# Patient Record
Sex: Female | Born: 1935 | Race: Black or African American | Hispanic: No | Marital: Married | Smoking: Never smoker
Health system: Southern US, Community
[De-identification: ages and names within clinical notes are randomized; demographics above are authoritative.]

## PROBLEM LIST (undated history)

## (undated) DIAGNOSIS — I219 Acute myocardial infarction, unspecified: Secondary | ICD-10-CM

## (undated) DIAGNOSIS — I771 Stricture of artery: Secondary | ICD-10-CM

## (undated) DIAGNOSIS — E7800 Pure hypercholesterolemia, unspecified: Secondary | ICD-10-CM

## (undated) DIAGNOSIS — I10 Essential (primary) hypertension: Secondary | ICD-10-CM

## (undated) DIAGNOSIS — M1990 Unspecified osteoarthritis, unspecified site: Secondary | ICD-10-CM

## (undated) DIAGNOSIS — I2510 Atherosclerotic heart disease of native coronary artery without angina pectoris: Secondary | ICD-10-CM

## (undated) DIAGNOSIS — K56609 Unspecified intestinal obstruction, unspecified as to partial versus complete obstruction: Secondary | ICD-10-CM

## (undated) DIAGNOSIS — E119 Type 2 diabetes mellitus without complications: Secondary | ICD-10-CM

## (undated) HISTORY — PX: CARDIAC CATHETERIZATION: SHX172

---

## 1968-08-05 HISTORY — PX: ABDOMINAL HYSTERECTOMY: SHX81

## 1999-04-06 HISTORY — PX: CATARACT EXTRACTION: SUR2

## 2001-08-05 DIAGNOSIS — I219 Acute myocardial infarction, unspecified: Secondary | ICD-10-CM

## 2001-08-05 HISTORY — DX: Acute myocardial infarction, unspecified: I21.9

## 2001-08-05 HISTORY — PX: CORONARY ARTERY BYPASS GRAFT: SHX141

## 2013-02-19 ENCOUNTER — Encounter (HOSPITAL_COMMUNITY): Payer: Self-pay | Admitting: Emergency Medicine

## 2013-02-19 ENCOUNTER — Emergency Department (INDEPENDENT_AMBULATORY_CARE_PROVIDER_SITE_OTHER)
Admission: EM | Admit: 2013-02-19 | Discharge: 2013-02-19 | Disposition: A | Discharge status: Home / Self Care | Payer: Medicare Other | Source: Home / Self Care

## 2013-02-19 DIAGNOSIS — W19XXXA Unspecified fall, initial encounter: Secondary | ICD-10-CM

## 2013-02-19 DIAGNOSIS — S025XXA Fracture of tooth (traumatic), initial encounter for closed fracture: Secondary | ICD-10-CM

## 2013-02-19 HISTORY — DX: Essential (primary) hypertension: I10

## 2013-02-19 MED ORDER — TRAMADOL HCL 50 MG PO TABS: 50.0000 mg | ORAL_TABLET | Freq: Four times a day (QID) | ORAL | Status: DC | PRN

## 2013-02-19 NOTE — ED Notes (Signed)
Pt c/o fall x 2 days ago. She fell on the sidewalk. Has abrasions on chin, under her nose, and broke her two front teeth. Mouth was bleeding at onset but has subsided. Has been rinsing with mouth wash. Also has abrasions on both hands. Pt is alert and oriented.

## 2013-02-19 NOTE — ED Provider Notes (Signed)
[place] Sonya Chavez is a 79 y.o. female who presents to Urgent Care today for fall. Patient tripped and fell on Wednesday. She tripped over an exposed lead to the sidewalk on Wednesday. She fell striking her face on the ground. She notes that she broke both of her upper front teeth. She denies any other injury or hitting her head. She denies any loss of consciousness confusion or dizziness. She denies any chest pain palpitations or presyncopal feelings prior to the fall. She's tried some Tylenol for pain which is been somewhat effective. She feels well otherwise.    PMH reviewed. Diabetes and hypertension History  Substance Use Topics  . Smoking status: Never Smoker   . Smokeless tobacco: Not on file  . Alcohol Use: No   ROS as above Medications reviewed. No current facility-administered medications for this encounter.   Current Outpatient Prescriptions  Medication Sig Dispense Refill  . glyBURIDE (DIABETA) 2.5 MG tablet Take 10 mg by mouth 2 (two) times daily with a meal.      . metFORMIN (GLUCOPHAGE) 1000 MG tablet Take 1,000 mg by mouth 2 (two) times daily with a meal.      . traMADol (ULTRAM) 50 MG tablet Take 1 tablet (50 mg total) by mouth every 6 (six) hours as needed for pain.  30 tablet  0    Exam:  BP 136/86  Pulse 67  Temp(Src) 98.2 F (36.8 C) (Oral)  Resp 16  SpO2 95% Gen: Well NAD HEENT: EOMI,  MMM, abrasion on the nose and chin. Chipped upper front teeth Lungs: CTABL Nl WOB Heart: RRR no MRG Abd: NABS, NT, ND Exts: Non edematous BL  LE, warm and well perfused.  Neuro: Alert and oriented normal balance and coordination. Normal gait  No results found for this or any previous visit (from the past 24 hour(s)). No results found.  Assessment and Plan: 79 y.o. female with trip and fall. Resulting in facial abrasion and chipped front teeth.  Note concern for syncope as the cause of the fall.  Tramadol and Tylenol as needed for pain.  Followup with dentist for tooth  injury.      Rodolph Bong, MD 02/19/13 [phone]

## 2013-03-08 ENCOUNTER — Inpatient Hospital Stay (HOSPITAL_COMMUNITY): Payer: Medicare Other

## 2013-03-08 ENCOUNTER — Emergency Department (HOSPITAL_COMMUNITY): Payer: Medicare Other

## 2013-03-08 ENCOUNTER — Encounter (HOSPITAL_COMMUNITY): Payer: Self-pay | Admitting: *Deleted

## 2013-03-08 ENCOUNTER — Inpatient Hospital Stay (HOSPITAL_COMMUNITY)
Admission: EM | Admit: 2013-03-08 | Discharge: 2013-03-09 | DRG: 069 | Disposition: A | Discharge status: Home / Self Care | Payer: Medicare Other | Attending: Family Medicine | Admitting: Family Medicine

## 2013-03-08 DIAGNOSIS — Z7982 Long term (current) use of aspirin: Secondary | ICD-10-CM

## 2013-03-08 DIAGNOSIS — E119 Type 2 diabetes mellitus without complications: Secondary | ICD-10-CM | POA: Diagnosis present

## 2013-03-08 DIAGNOSIS — I498 Other specified cardiac arrhythmias: Secondary | ICD-10-CM | POA: Diagnosis not present

## 2013-03-08 DIAGNOSIS — R200 Anesthesia of skin: Secondary | ICD-10-CM | POA: Diagnosis present

## 2013-03-08 DIAGNOSIS — G459 Transient cerebral ischemic attack, unspecified: Principal | ICD-10-CM | POA: Diagnosis present

## 2013-03-08 DIAGNOSIS — R0989 Other specified symptoms and signs involving the circulatory and respiratory systems: Secondary | ICD-10-CM

## 2013-03-08 DIAGNOSIS — E785 Hyperlipidemia, unspecified: Secondary | ICD-10-CM | POA: Diagnosis present

## 2013-03-08 DIAGNOSIS — I10 Essential (primary) hypertension: Secondary | ICD-10-CM | POA: Diagnosis present

## 2013-03-08 DIAGNOSIS — E875 Hyperkalemia: Secondary | ICD-10-CM | POA: Diagnosis present

## 2013-03-08 DIAGNOSIS — I2510 Atherosclerotic heart disease of native coronary artery without angina pectoris: Secondary | ICD-10-CM | POA: Diagnosis present

## 2013-03-08 DIAGNOSIS — Z79899 Other long term (current) drug therapy: Secondary | ICD-10-CM

## 2013-03-08 DIAGNOSIS — M5412 Radiculopathy, cervical region: Secondary | ICD-10-CM | POA: Diagnosis present

## 2013-03-08 DIAGNOSIS — I6789 Other cerebrovascular disease: Secondary | ICD-10-CM | POA: Diagnosis present

## 2013-03-08 DIAGNOSIS — Z951 Presence of aortocoronary bypass graft: Secondary | ICD-10-CM

## 2013-03-08 DIAGNOSIS — R209 Unspecified disturbances of skin sensation: Secondary | ICD-10-CM

## 2013-03-08 DIAGNOSIS — I447 Left bundle-branch block, unspecified: Secondary | ICD-10-CM | POA: Diagnosis present

## 2013-03-08 DIAGNOSIS — R202 Paresthesia of skin: Secondary | ICD-10-CM | POA: Diagnosis present

## 2013-03-08 DIAGNOSIS — G458 Other transient cerebral ischemic attacks and related syndromes: Secondary | ICD-10-CM | POA: Diagnosis present

## 2013-03-08 LAB — POCT I-STAT, CHEM 8
BUN: 21 mg/dL (ref 6–23)
Calcium, Ion: 1.2 mmol/L (ref 1.13–1.30)
Creatinine, Ser: 1.1 mg/dL (ref 0.50–1.10)
Glucose, Bld: 165 mg/dL — ABNORMAL HIGH (ref 70–99)
Hemoglobin: 15.6 g/dL — ABNORMAL HIGH (ref 12.0–15.0)
TCO2: 27 mmol/L (ref 0–100)

## 2013-03-08 LAB — BASIC METABOLIC PANEL
BUN: 14 mg/dL (ref 6–23)
CO2: 28 mEq/L (ref 19–32)
Chloride: 105 mEq/L (ref 96–112)
Creatinine, Ser: 0.93 mg/dL (ref 0.50–1.10)
Glucose, Bld: 133 mg/dL — ABNORMAL HIGH (ref 70–99)

## 2013-03-08 LAB — CBC
MCH: 27.1 pg (ref 26.0–34.0)
MCV: 83.6 fL (ref 78.0–100.0)
Platelets: 164 10*3/uL (ref 150–400)
RDW: 15.3 % (ref 11.5–15.5)

## 2013-03-08 LAB — APTT: aPTT: 31 seconds (ref 24–37)

## 2013-03-08 MED ORDER — DOCUSATE SODIUM 100 MG PO CAPS: 100.0000 mg | ORAL_CAPSULE | Freq: Every day | ORAL | Status: DC | PRN

## 2013-03-08 MED ORDER — HYDRALAZINE HCL 20 MG/ML IJ SOLN
10.0000 mg | INTRAMUSCULAR | Status: DC | PRN
  Filled 2013-03-08 (×2): qty 1

## 2013-03-08 MED ORDER — ASPIRIN 81 MG PO CHEW
324.0000 mg | CHEWABLE_TABLET | Freq: Once | ORAL | Status: AC
  Administered 2013-03-08: 324 mg via ORAL
  Filled 2013-03-08: qty 4

## 2013-03-08 MED ORDER — ONDANSETRON HCL 4 MG/2ML IJ SOLN: 4.0000 mg | Freq: Four times a day (QID) | INTRAMUSCULAR | Status: DC | PRN

## 2013-03-08 MED ORDER — FUROSEMIDE 40 MG PO TABS
40.0000 mg | ORAL_TABLET | Freq: Every day | ORAL | Status: DC
  Administered 2013-03-08 – 2013-03-09 (×2): 40 mg via ORAL
  Filled 2013-03-08 (×2): qty 1

## 2013-03-08 MED ORDER — METOPROLOL TARTRATE 100 MG PO TABS
100.0000 mg | ORAL_TABLET | Freq: Two times a day (BID) | ORAL | Status: DC
  Administered 2013-03-08: 100 mg via ORAL
  Filled 2013-03-08 (×3): qty 1

## 2013-03-08 MED ORDER — INSULIN ASPART 100 UNIT/ML ~~LOC~~ SOLN: 0.0000 [IU] | Freq: Three times a day (TID) | SUBCUTANEOUS | Status: DC

## 2013-03-08 MED ORDER — SODIUM CHLORIDE 0.9 % IV SOLN: INTRAVENOUS | Status: DC

## 2013-03-08 MED ORDER — POTASSIUM CHLORIDE CRYS ER 20 MEQ PO TBCR
20.0000 meq | EXTENDED_RELEASE_TABLET | Freq: Every day | ORAL | Status: DC
  Administered 2013-03-08 – 2013-03-09 (×2): 20 meq via ORAL
  Filled 2013-03-08 (×2): qty 1

## 2013-03-08 MED ORDER — HEPARIN SODIUM (PORCINE) 5000 UNIT/ML IJ SOLN
5000.0000 [IU] | Freq: Three times a day (TID) | INTRAMUSCULAR | Status: DC
  Administered 2013-03-08 – 2013-03-09 (×2): 5000 [IU] via SUBCUTANEOUS
  Filled 2013-03-08 (×5): qty 1

## 2013-03-08 MED ORDER — METOPROLOL TARTRATE 25 MG PO TABS
100.0000 mg | ORAL_TABLET | Freq: Once | ORAL | Status: AC
  Administered 2013-03-08: 100 mg via ORAL
  Filled 2013-03-08: qty 4

## 2013-03-08 MED ORDER — LOSARTAN POTASSIUM 50 MG PO TABS
100.0000 mg | ORAL_TABLET | Freq: Once | ORAL | Status: DC
  Filled 2013-03-08: qty 2

## 2013-03-08 MED ORDER — LOSARTAN POTASSIUM 50 MG PO TABS
100.0000 mg | ORAL_TABLET | Freq: Once | ORAL | Status: AC
  Administered 2013-03-08: 100 mg via ORAL
  Filled 2013-03-08: qty 2

## 2013-03-08 MED ORDER — LOSARTAN POTASSIUM 50 MG PO TABS: 100.0000 mg | ORAL_TABLET | Freq: Once | ORAL | Status: DC

## 2013-03-08 MED ORDER — SODIUM CHLORIDE 0.9 % IJ SOLN
3.0000 mL | Freq: Two times a day (BID) | INTRAMUSCULAR | Status: DC
  Administered 2013-03-08 – 2013-03-09 (×2): 3 mL via INTRAVENOUS

## 2013-03-08 MED ORDER — ONDANSETRON HCL 4 MG PO TABS: 4.0000 mg | ORAL_TABLET | Freq: Four times a day (QID) | ORAL | Status: DC | PRN

## 2013-03-08 MED ORDER — TRAMADOL HCL 50 MG PO TABS: 50.0000 mg | ORAL_TABLET | Freq: Four times a day (QID) | ORAL | Status: DC | PRN

## 2013-03-08 MED ORDER — CLOPIDOGREL BISULFATE 75 MG PO TABS
75.0000 mg | ORAL_TABLET | Freq: Every day | ORAL | Status: DC
  Administered 2013-03-09: 75 mg via ORAL
  Filled 2013-03-08 (×3): qty 1

## 2013-03-08 MED ORDER — HYDRALAZINE HCL 20 MG/ML IJ SOLN: 10.0000 mg | INTRAMUSCULAR | Status: DC | PRN

## 2013-03-08 MED ORDER — ASPIRIN EC 81 MG PO TBEC
81.0000 mg | DELAYED_RELEASE_TABLET | Freq: Every day | ORAL | Status: DC
  Administered 2013-03-08 – 2013-03-09 (×2): 81 mg via ORAL
  Filled 2013-03-08 (×2): qty 1

## 2013-03-08 NOTE — H&P (Signed)
Family Medicine Teaching [facility] Admission History and Physical Service Pager: [phone]  Patient name: Sonya [place] M Coates Medical record number: [id number] Date of birth: 07/23/1926 Age: 79 y.o. Gender: female  Primary Care Provider: Dois Davenport., MD at [facility] Consultants: Vascular Surgery, Neurology Code Status: FULL CODE  Chief Complaint:  Left hand numbness  Assessment and Plan: Sonya Chavez is a 79 y.o. year old female presenting with left hand numbness, concerning for subclavian steal syndrome vs. TIA.  # Left Hand Numbness (suspect subclavian steal syndrome) New-onset L-hand/forearm numbness w/o any other associated sx, which has since resolved. No prior hx of similar sx. L-radial / ulnar pulses significantly diminished, w/o edema, pain, or significant loss of warmth. Evaluated by doppler US in ED, preliminary report suggestions subclavian steal syndrome. Diff Dx: TIA vs. Acute CVA (possible - resolved; no other neuro sx, has risk factors (CAD, HTN, DM), on daily ASA) - monitor for recurrent symptoms [ ]  f/u Head CT w/o contrast - start Plavix 75mg  daily due to possible failure of ASA - risk stratification labs -TSH, A1C, Lipid panel in AM - Patient will likely need MRI/MRA but is claustrophobic therefore will need sedation prior to exam - Will consult Neurology for further recommendations - Vascular Surgery:    - doppler LUE / Carotid: suggests subclavian steal with L-vert artery retrograde flow    [ ]  if recurrent L-arm sx (numbness / weakness) w/o evidence of stroke - will consider Arch Aortogram with L-arm Angiogram. Caution due to increased risk of stroke with this test. Unlikely candidate for subclavian artery transposition / bypass due to age    - NPO after midnight, but allow to eat if not having further procedures tomorrow.  # HTN Urgency Significantly elevated BPs upon arrival in ED (225/68) had been without BP meds  all day, given Metoprolol and Amlodipine in ED with minimal response, BP range (159-235 / 68-88). No associated sx of CP, HA, dizziness, weakness, neurological changes. - monitor for HTN Emergency w/ any new sx that might suggest end organ damage (MI, Stroke, AKI) - cycle troponins, first troponin neg - repeat EKG in AM - f/u BMET in AM - restarted home BP meds: Metoprolol 100mg  BID, Losartan 100mg  daily, Lasix 40mg  - Hydralazine 10mg  IV q 4 hr PRN (systolic > 180) [ ]  f/u BP trend, if not responding consider other BP agents  # LBBB, unknown onset Discovered on initial EKG in ED. No prior EKGs for comparison. Patient does have +Cardiac Hx with CAD s/p CABG (2003). Concern that new-onset LBBB is sign of ACS, but with known CAD this is likely not pertinent finding. However, no concerning sx of CP, SoB, weakness, generalized malaise/nausea. Only symptom of L-arm numbness has resolved (< 1 hour). - repeat EKG in AM - cardiac work-up including troponins [ ]  consider Cardiology c/s if symptoms worsen (Of note, patient has significant cards history and is not established in this area, but would likely benefit from outpatient cardiology appointment)  # CAD s/p CABG 4x vessel (2003) Existing CAD with additional risk factors (age 44, AA race, HTN, DM). - risk stratification: ordered HgA1c, TSH, Lipid panel - continue ASA 81mg  daily as dual antiplatelet for now but can possibly d/c if on Plavix long term  # DM2 Reportedly well-controlled on PO meds (Metformin, Glyburide). Daily BS ranges (80s-150s). - dc'd home POs including Metformin while in hospital, due to potential angiogram w/ contrast - Sensitive SSI  - CBG qac/qhs  FEN/GI: NS  155ml/hr, heart healthy diet Prophylaxis: SQ Heparin  Disposition: Home; pending clinical course  History of Present Illness: [place] Sonya Chavez is a 79 y.o. year old female presenting with left hand numbness . PMH is significant for DM, HTN, CAD s/p CABG 2003  (quadruple bypass in [place]).  Patient states at 10am she developed left hand numbness that extended up to her wrist. It resolved after her husband rubbed her hand and arm. She denies any other symptoms at that time (headache, dizziness, CP, SOB) Episode lasted total of 30 minutes. She states she has never had anything like this before. She states her hand does not feel hot or cold currently.   In the ED, she had diminished pulses in left upper extremity. Vascular surgery was consulted and dopplers were ordered, studies concerning for subclavian steal. She was also hypertensive to 225/68 in ED, although reportedly had not taken her meds today. ECG showed LBBB, patient is unaware of any prior history of having one.  Review Of Systems: Per HPI with the following additions: Denies HA, weakness, visual changes, slurred speech, numbness or tingling in other locations.  Otherwise 12 point review of systems was performed and was unremarkable.  Patient Active Problem List   Diagnosis Date Noted  . Numbness and tingling in left hand 03/08/2013  . HTN (hypertension) 03/08/2013  . DM (diabetes mellitus) 03/08/2013  . CAD (coronary artery disease) 03/08/2013   Past Medical History: Past Medical History  Diagnosis Date  . Hypertension   . Diabetes mellitus without complication    Past Surgical History: Past Surgical History  Procedure Laterality Date  . Coronary artery bypass graft      2003 in [place], [place]  . Abdominal hysterectomy      1970   Social History: History  Substance Use Topics  . Smoking status: Never Smoker   . Smokeless tobacco: Not on file  . Alcohol Use: No   Additional social history: Moved from [place], [place] in early Dec 2013.  Lives at home with husband. Niece is nearby. Ambulates with cane at times, but no problems with ADLs at home.   Please also refer to relevant sections of EMR.  Family History: No family history on file. Allergies and  Medications: Allergies  Allergen Reactions  . Crestor (Rosuvastatin) Other (See Comments)    Muscle cramps    No current facility-administered medications on file prior to encounter.   Current Outpatient Prescriptions on File Prior to Encounter  Medication Sig Dispense Refill  . glyBURIDE (DIABETA) 2.5 MG tablet Take 2.5 mg by mouth daily.       . metFORMIN (GLUCOPHAGE) 1000 MG tablet Take 1,000 mg by mouth 2 (two) times daily with a meal.      . traMADol (ULTRAM) 50 MG tablet Take 1 tablet (50 mg total) by mouth every 6 (six) hours as needed for pain.  30 tablet  0   Objective: BP 205/74  Pulse 53  Temp(Src) 98.9 F (37.2 C) (Oral)  Resp 18  Ht 5\' 2"  (1.575 m)  Wt 136 lb 14.5 oz (62.1 kg)  BMI 25.03 kg/m2  SpO2 96% Exam: General: pleasant, WDWN, NAD. Family members at bedside HEENT: PERRLA, EOMI. MMM. Cardiovascular: RRR, no murmurs Respiratory: CTAB. Good effort Abdomen: soft NT/ND, +4 BS Extremities: L-arm thready Radial / ulnar pulses, all other ext +2 pulses. Moves all ext. No edema or cyanosis Skin: warm, dry, no rashes Neuro: grossly non-focal, muscle str bilateral ext 5/5, distal sensation intact b/l  Labs and Imaging: CBC BMET   Recent Labs Lab 03/08/13 1411  WBC 8.1  HGB 13.9  HCT 42.8  PLT 164    Recent Labs Lab 03/08/13 1416  NA 141  K 4.8  CL 105  CO2 28  BUN 14  CREATININE 0.93  GLUCOSE 133*  CALCIUM 9.6     8/4 Doppler left upper extremity monophasic subclavian steal  Carotid duplex shows left vertebral artery retrograde flow  8/4 CXR No acute process  Saralyn Pilar, DO 03/08/2013, 8:08 PM PGY-1, [facility] FPTS Intern pager: [phone], text pages welcome ____________________________________________________________  PGY-3 Addendum: I have seen and examined Ms. Senaida Ores and agree with Dr. Althea Charon' note with additions marked in blue. Will consult neurology and get head CT for possible TIA, in addition to  continuing work up for possible subclavian steal. Patient and family updated at bedside on plan.  Billal Rollo M. Javad Salva, M.D. 03/08/2013 8:30 PM

## 2013-03-08 NOTE — Progress Notes (Signed)
*  PRELIMINARY RESULTS* Vascular Ultrasound Carotid Duplex (Doppler) and bilateral upper extremity arterial doppler has been completed.   Findings suggest 1-39% internal carotid artery stenosis bilaterally. The right vertebral artery is patent with elevated velocities, suggestive of compensatory flow vs stenosis. The right subclavian artery is patent with biphasic flow. The left vertebral artery is patent with retrograde flow. The left subclavian artery is patent with monophasic flow; this along with the left vertebral artery findings are suggestive of a subclavian steal. There is an 11 point pressure difference between the right brachial artery and the right radial artery, and a 73 point pressure difference between the right brachial artery and right ulnar artery. This may be due to stenosis vs technical error, as the radial and ulnar arteries were difficult to insonate.  Preliminary results discussed with Lianne Cure, PA-C.  03/08/2013 4:56 PM Gertie Fey, RVT, RDCS, RDMS

## 2013-03-08 NOTE — ED Provider Notes (Signed)
CSN: [id number]     Arrival date & time 03/08/13  1127 History     First MD Initiated Contact with Patient 03/08/13 1157     Chief Complaint  Patient presents with  . Numbness   (Consider location/radiation/quality/duration/timing/severity/associated sxs/prior Treatment) HPI Comments: 79 yo female with gradual onset numbness in her left hand, eventually extending to her left elbow.    Patient is a 79 y.o. female presenting with neurologic complaint.  Neurologic Problem This is a new problem. Episode onset: 10 am. The problem occurs constantly. The problem has been resolved. Pertinent negatives include no chest pain, no abdominal pain and no shortness of breath. Nothing aggravates the symptoms. Relieved by: massage.    Past Medical History  Diagnosis Date  . Hypertension   . Diabetes mellitus without complication    Past Surgical History  Procedure Laterality Date  . Quadruple bipass     No family history on file. History  Substance Use Topics  . Smoking status: Never Smoker   . Smokeless tobacco: Not on file  . Alcohol Use: No   OB History   Grav Para Term Preterm Abortions TAB SAB Ect Mult Living                 Review of Systems  Constitutional: Negative for fever.  HENT: Negative for congestion.   Respiratory: Negative for cough and shortness of breath.   Cardiovascular: Negative for chest pain.  Gastrointestinal: Negative for nausea, vomiting, abdominal pain and diarrhea.  All other systems reviewed and are negative.    Allergies  Crestor  Home Medications   Current Outpatient Rx  Name  Route  Sig  Dispense  Refill  . aspirin EC 81 MG tablet   Oral   Take 81 mg by mouth daily.         . furosemide (LASIX) 40 MG tablet   Oral   Take 40 mg by mouth daily.         Marland Kitchen glyBURIDE (DIABETA) 2.5 MG tablet   Oral   Take 2.5 mg by mouth daily.          Marland Kitchen losartan (COZAAR) 100 MG tablet   Oral   Take 100 mg by mouth daily.         . metFORMIN  (GLUCOPHAGE) 1000 MG tablet   Oral   Take 1,000 mg by mouth 2 (two) times daily with a meal.         . metoprolol (LOPRESSOR) 100 MG tablet   Oral   Take 100 mg by mouth 2 (two) times daily.         . potassium chloride SA (K-DUR,KLOR-CON) 20 MEQ tablet   Oral   Take 20 mEq by mouth daily.         . pravastatin (PRAVACHOL) 20 MG tablet   Oral   Take 20 mg by mouth daily.         . traMADol (ULTRAM) 50 MG tablet   Oral   Take 1 tablet (50 mg total) by mouth every 6 (six) hours as needed for pain.   30 tablet   0    BP 206/80  Pulse 64  Temp(Src) 98.9 F (37.2 C) (Oral)  Resp 20  Ht 5\' 2"  (1.575 m)  Wt 145 lb (65.772 kg)  BMI 26.51 kg/m2  SpO2 98% Physical Exam  Nursing note and vitals reviewed. Constitutional: She is oriented to person, place, and time. She appears well-developed and well-nourished. No distress.  HENT:  Head: Normocephalic and atraumatic.  Mouth/Throat: Oropharynx is clear and moist.  Eyes: Conjunctivae are normal. Pupils are equal, round, and reactive to light. No scleral icterus.  Neck: Neck supple.  Cardiovascular: Normal rate, regular rhythm, normal heart sounds and intact distal pulses.   No murmur heard. Pulmonary/Chest: Effort normal and breath sounds normal. No stridor. No respiratory distress. She has no rales.  Abdominal: Soft. Bowel sounds are normal. She exhibits no distension. There is no tenderness.  Musculoskeletal: Normal range of motion.  Bounding right radial pulse, no palpable left radial pulse.  Able to doppler left radial pulse.    Neurological: She is alert and oriented to person, place, and time.  Skin: Skin is warm and dry. No rash noted.  Psychiatric: She has a normal mood and affect. Her behavior is normal.    ED Course   Procedures (including critical care time)  Labs Reviewed  CBC - Abnormal; Notable for the following:    RBC 5.12 (*)    All other components within normal limits  BASIC METABOLIC PANEL -  Abnormal; Notable for the following:    Glucose, Bld 133 (*)    GFR calc non Af Amer 54 (*)    GFR calc Af Amer 63 (*)    All other components within normal limits  POCT I-STAT, CHEM 8 - Abnormal; Notable for the following:    Potassium 5.8 (*)    Glucose, Bld 165 (*)    Hemoglobin 15.6 (*)    All other components within normal limits  APTT  PROTIME-INR  HEMOGLOBIN A1C  TSH  TROPONIN I  TROPONIN I  TROPONIN I  BASIC METABOLIC PANEL  CBC  POCT I-STAT TROPONIN I   Dg Chest Portable 1 View  03/08/2013   *RADIOLOGY REPORT*  Clinical Data: Left hand numbness  PORTABLE CHEST - 1 VIEW  Comparison: None.  Findings: Cardiac leads overlie the chest.  Heart size is normal. Lung volumes are low but clear.  No pleural effusion.  No acute osseous finding.  Evidence of CABG noted.  Possible loose bodies noted in the left shoulder.  IMPRESSION: Low lung volumes with crowding of the bronchovascular markings but no focal acute finding.   Original Report Authenticated By: Christiana Pellant, M.D.  All radiology studies independently viewed by me.     EKG - sinus, rate 61, LAD, LBBB, negative sgarbossa criteria.  No priors available.    1. Subclavian Steal Syndrome 2. Left hand numbness  MDM  Numbness in left hand resolved prior to arrival.  Initially unclear whether this could be an anginal equivalent, especially with LBBB of unknown duration.  However, decreased pulse of LUE noted on physical exam.  So, discussed with vascular surgery who recommended arterial duplex.  This showed subclavian steal syndrome.  Rec'd medicine admission and plan for angio in AM.  Given home antihypertensives for hypertension.  Admitted to family practice.  Candyce Churn, MD 03/08/13 [phone]

## 2013-03-08 NOTE — Discharge Summary (Signed)
Family Medicine Teaching [facility] Discharge Summary  Patient name: Sonya Chavez Medical record number: [id number] Date of birth: February 13, 1926 Age: 79 y.o. Gender: female Date of Admission: 03/08/2013  Date of Discharge: 03/09/13 Admitting Physician: Zachery Dauer, MD  Primary Care Provider: Dois Davenport., MD Consultants: Vascular Surgery, Neurology, PT/OT   Indication for Hospitalization: Left hand numbness, HTN Urgency  Discharge Diagnoses/Problem List:  Patient Active Problem List   Diagnosis Date Noted  . Numbness and tingling in left hand 03/08/2013  . HTN (hypertension) 03/08/2013  . DM (diabetes mellitus) 03/08/2013  . CAD (coronary artery disease) 03/08/2013   Disposition: Home  Discharge Condition: stable   Brief Hospital Course:  [place] is a 79 yo F that presented with left hand numbness.  The numbness lasted for 45 minutes and dissipated prior to arrival in the ED.  She had no chest pain, shortness of breath and no ab pain. The pain started in her left hand and extended to her elbow. She was worked up for Subclavian steal syndrome, TIA, Acute CVA. An carotid/upper extremity doppler, CT, MRI/MRA, and ECHO were done. There were negative for any acute changes. She had no residual neuro defects and was feeling back to baseline. PT/OT saw her and had no further workup stating coordination deficits were premorbid. She is to continue ASA 81 mg and Plavix 75 mg for continued secondary stroke prevention. She had been taking ASA prior to this event but compliance was any issue so it was not considered a failed aspirin. She has an extensive statin allergy of myalgia but Neurology recommended a goal of <70 LDL. Pravastatin 40 mg daily was started with a potential goal of 80 mg.  #Numbness and tingling in left hand- TIA vs. Subclavian steal syndrome  - start Plavix 75mg  daily, ASA 81 mg, Pravastatin 40 mg  - risk stratification labs -TSH, A1C, Lipid panel -->45.9% calculated  risk   # HTN  Significantly elevated BPs upon arrival in ED (225/68) had been without BP meds all day, given Metoprolol and Amlodipine in ED with minimal response, BP range (159-235 / 68-88). No associated sx of CP, HA, dizziness, weakness, neurological changes. She was also having bradycardia (40's-50's) so her Metoprolol dose was cut in half from 100 mg BID-->50 mg BID.  -Losartan 100mg  daily, Lasix 40 mg, Metoprolol 50 mg BID  # LBBB, unknown onset  -Discovered on initial EKG in ED. No prior EKGs for comparison. Patient does have +Cardiac Hx with CAD s/p CABG (2003). Concern that new-onset LBBB is sign of ACS, but with known CAD this is likely not pertinent finding. However, no concerning sx of CP, SoB, weakness, generalized malaise/nausea. Only symptom of L-arm numbness has resolved (< 1 hour).  [ ]  consider Cardiology c/s if symptoms worsen (Of note, patient has significant cards history and is not established in this area, but would likely benefit from outpatient cardiology appointment)   # CAD s/p CABG 4x vessel (2003)  -Existing CAD with additional risk factors (age 89, AA race, HTN, DM).   # DM2  Reportedly well-controlled on PO meds (Metformin, Glyburide). Daily BS ranges (80s-160s).  -Hgb A1c: 6.9   Issues for Follow Up:  1. Tingling in left hand  2. HTN 3. HLD: neurology recommended to keep LDL <70  Significant Procedures: none  Significant Labs and Imaging:   Recent Labs Lab 03/08/13 1335 03/08/13 1411 03/09/13 0500  WBC  --  8.1 8.8  HGB 15.6* 13.9 13.3  HCT 46.0 42.8 40.9  PLT  --  164 204    Recent Labs Lab 03/08/13 1335 03/08/13 1416 03/09/13 0500  NA 140 141 142  K 5.8* 4.8 4.1  CL 107 105 104  CO2  --  28 27  GLUCOSE 165* 133* 128*  BUN 21 14 15   CREATININE 1.10 0.93 0.93  CALCIUM  --  9.6 9.9    Recent Labs Lab 03/08/13 1843 03/09/13 0022 03/09/13 0500  TROPONINI <0.30 <0.30 <0.30    Recent Labs Lab 03/09/13 0846 03/09/13 1122   GLUCAP 160* 138*   Lipid Panel     Component Value Date/Time   CHOL 193 03/09/2013 0500   TRIG 78 03/09/2013 0500   HDL 57 03/09/2013 0500   CHOLHDL 3.4 03/09/2013 0500   VLDL 16 03/09/2013 0500   LDLCALC 120* 03/09/2013 0500   Hgb A1C: 6.9  8/5: ECHO: LV EF: 55% - 60%  8/5: MRI HEAD WITHOUT CONTRAST  MRA HEAD WITHOUT CONTRAST  Comparison: CT head 03/08/2013  IMPRESSION:  Mild chronic ischemic change. No acute abnormality.  MRA HEAD  IMPRESSION:  Mild stenosis of the mid basilar. No large vessel occlusion.   8/4: Doppler Carotid/Bilateral upper extremity arterial:  Summary: Findings suggest 1-39% internal carotid artery stenosis bilaterally. The right vertebral artery is patent with elevated velocities, suggestive of compensatory flow versus stenosis. The right subclavian artery is patent with biphasic flow. The left vertebral artery is patent with retrograde flow. The left subclavian artery is patent with monophasic flow; this along with the left vertebral artery findings are suggestive of a subclavian steal.  There is an 11 point pressure difference between the right brachial artery and the right radial artery, and a 73 point pressure difference between the right brachail artery and the right ulnar artery. This may be due to stenosisversustechnical error, as these arteries were difficult to insonate.   8/4:CT HEAD WITHOUT CONTRAST  IMPRESSION:  Chronic small vessel ischemic disease. No acute intracranial  process identified.   8/4:PORTABLE CHEST - 1 VIEW  IMPRESSION:  Low lung volumes with crowding of the bronchovascular markings but  no focal acute finding.  Outstanding Results: none  Discharge Medications:    Medication List         aspirin EC 81 MG tablet  Take 81 mg by mouth daily.     clopidogrel 75 MG tablet  Commonly known as:  PLAVIX  Take 1 tablet (75 mg total) by mouth daily with breakfast.     furosemide 40 MG tablet  Commonly known as:  LASIX   Take 40 mg by mouth daily.     glyBURIDE 2.5 MG tablet  Commonly known as:  DIABETA  Take 2.5 mg by mouth daily.     losartan 100 MG tablet  Commonly known as:  COZAAR  Take 100 mg by mouth daily.     metFORMIN 1000 MG tablet  Commonly known as:  GLUCOPHAGE  Take 1,000 mg by mouth 2 (two) times daily with a meal.     metoprolol 50 MG tablet  Commonly known as:  LOPRESSOR  Take 1 tablet (50 mg total) by mouth 2 (two) times daily.     potassium chloride SA 20 MEQ tablet  Commonly known as:  K-DUR,KLOR-CON  Take 20 mEq by mouth daily.     pravastatin 40 MG tablet  Commonly known as:  PRAVACHOL  Take 1 tablet (40 mg total) by mouth daily at 6 PM.     traMADol 50 MG tablet  Commonly known  as:  ULTRAM  Take 1 tablet (50 mg total) by mouth every 6 (six) hours as needed for pain.        Discharge Instructions: Please refer to Patient Instructions section of EMR for full details.  Patient was counseled important signs and symptoms that should prompt return to medical care, changes in medications, dietary instructions, activity restrictions, and follow up appointments.   Follow-Up Appointments:  Follow-up Information   Follow up On 03/12/2013. (9:00 am )    Contact information:   Dr. Milford Cage  [address]  [address]  [place], [place] [address] [phone]      Clare Gandy, MD 03/09/2013, 7:44 PM PGY-1, [facility]

## 2013-03-08 NOTE — ED Notes (Signed)
Pt with numbness starting in L fingers that moved up to elbow lasting 45 minutes that gradually dissipated when her husband massaged it.  bp 225/68.

## 2013-03-08 NOTE — Consult Note (Signed)
NEURO HOSPITALIST CONSULT NOTE    Reason for Consult: left hand/forearm numbness (resolved).  HPI:                                                                                                                                          [place] [name] is an 79 y.o. female, right handed, with a significant past medical history significant for hypertension, DM, CAD s/p CABG, admitted to [facility] today due to new onset let hand/forearm numbness. Stated that she never had similar symptoms before. She went to bed last night and awoke this morning feeling well, but around 10 am developed sudden onset of numbness involving the fingers left hand which rapidly traveled to the forearm and stopped around the elbow. She said that " it happened all over my left hand and forearm" and dissipated around 30 minutes later. Denies associated weakness, headache, neck pain, vertigo, double vision, difficulty swallowing, unsteadiness, confusion,  slurred speech, language or vision impairment. CT brain upon arrival to the ED showed no acute intracranial abnormality.  Absent left radial and ulnar pulsed detected in the ED and this was subsequently valuated by doppler US in ED, preliminary report suggestions subclavian steal syndrome. She said that she " sometimes" forgets to take her aspirin. Currently, she is asymptomatic from a neurological standpoint.     Past Medical History  Diagnosis Date  . Hypertension   . Diabetes mellitus without complication     Past Surgical History  Procedure Laterality Date  . Coronary artery bypass graft      2003 in [place], [place]  . Abdominal hysterectomy      1970    No family history on file.  Family History:  Social History:  reports that she has never smoked. She does not have any smokeless tobacco history on file. She reports that she does not drink alcohol or use illicit drugs.  Allergies  Allergen Reactions  . Crestor (Rosuvastatin) Other  (See Comments)    Muscle cramps     MEDICATIONS:                                                                                                                     I have reviewed the patient's current medications.   ROS:  History obtained from the patient and chart review.  General ROS: negative for - chills, fatigue, fever, night sweats, weight gain or weight loss Psychological ROS: negative for - behavioral disorder, hallucinations, memory difficulties, mood swings or suicidal ideation Ophthalmic ROS: negative for - blurry vision, double vision, eye pain or loss of vision ENT ROS: negative for - epistaxis, nasal discharge, oral lesions, sore throat, tinnitus or vertigo Allergy and Immunology ROS: negative for - hives or itchy/watery eyes Hematological and Lymphatic ROS: negative for - bleeding problems, bruising or swollen lymph nodes Endocrine ROS: negative for - galactorrhea, hair pattern changes, polydipsia/polyuria or temperature intolerance Respiratory ROS: negative for - cough, hemoptysis, shortness of breath or wheezing Cardiovascular ROS: negative for - chest pain, dyspnea on exertion, edema or irregular heartbeat Gastrointestinal ROS: negative for - abdominal pain, diarrhea, hematemesis, nausea/vomiting or stool incontinence Genito-Urinary ROS: negative for - dysuria, hematuria, incontinence or urinary frequency/urgency Musculoskeletal ROS: negative for - joint swelling or muscular weakness Neurological ROS: as noted in HPI Dermatological ROS: negative for rash and skin lesion changes   Physical exam: pleasant female in no apparent distress. Blood pressure 205/74, pulse 53, temperature 98.9 F (37.2 C), temperature source Oral, resp. rate 18, height 5\' 2"  (1.575 m), weight 62.1 kg (136 lb 14.5 oz), SpO2 96.00%. Head: normocephalic. Neck:  supple, no bruits, no JVD. Cardiac: no murmurs. Lungs: clear. Abdomen: soft, no tender, no mass. Extremities: no edema.    Neurologic Examination:                                                                                                      Mental Status: Alert, awake, oriented x 4, thought content appropriate. Comprehension, naming,and repetition intact.  Speech fluent without evidence of aphasia.  Able to follow 3 step commands without difficulty. Cranial Nerves: II: Discs flat bilaterally; Visual fields grossly normal, pupils equal, round, reactive to light and accommodation III,IV, VI: ptosis not present, extra-ocular motions intact bilaterally V,VII: smile symmetric, facial light touch sensation normal bilaterally VIII: hearing normal bilaterally IX,X: gag reflex present XI: bilateral shoulder shrug XII: midline tongue extension Motor: Right : Upper extremity   5/5    Left:     Upper extremity   5/5  Lower extremity   5/5     Lower extremity   5/5 Tone and bulk:normal tone throughout; no atrophy noted Sensory: Pinprick and light touch intact throughout, bilaterally Deep Tendon Reflexes:  2+ all over  Plantars: Right: downgoing   Left: downgoing Cerebellar: normal finger-to-nose,  normal heel-to-shin test Gait:  No ataxia. CV: pulses palpable throughout    No results found for this basename: cbc, bmp, coags, chol, tri, ldl, hga1c    Results for orders placed during the hospital encounter of 03/08/13 (from the past 48 hour(s))  POCT I-STAT TROPONIN I     Status: None   Collection Time    03/08/13  1:33 PM      Result Value Range   Troponin i, poc 0.02  0.00 - 0.08 ng/mL   Comment 3  Comment: Due to the release kinetics of cTnI,     a negative result within the first hours     of the onset of symptoms does not rule out     myocardial infarction with certainty.     If myocardial infarction is still suspected,     repeat the test at appropriate  intervals.  POCT I-STAT, CHEM 8     Status: Abnormal   Collection Time    03/08/13  1:35 PM      Result Value Range   Sodium 140  135 - 145 mEq/L   Potassium 5.8 (*) 3.5 - 5.1 mEq/L   Chloride 107  96 - 112 mEq/L   BUN 21  6 - 23 mg/dL   Creatinine, Ser 1.61  0.50 - 1.10 mg/dL   Glucose, Bld 096 (*) 70 - 99 mg/dL   Calcium, Ion 0.45  4.09 - 1.30 mmol/L   TCO2 27  0 - 100 mmol/L   Hemoglobin 15.6 (*) 12.0 - 15.0 g/dL   HCT 81.1  91.4 - 78.2 %  CBC     Status: Abnormal   Collection Time    03/08/13  2:11 PM      Result Value Range   WBC 8.1  4.0 - 10.5 K/uL   RBC 5.12 (*) 3.87 - 5.11 MIL/uL   Hemoglobin 13.9  12.0 - 15.0 g/dL   HCT 95.6  21.3 - 08.6 %   MCV 83.6  78.0 - 100.0 fL   MCH 27.1  26.0 - 34.0 pg   MCHC 32.5  30.0 - 36.0 g/dL   RDW 57.8  46.9 - 62.9 %   Platelets 164  150 - 400 K/uL  APTT     Status: None   Collection Time    03/08/13  2:11 PM      Result Value Range   aPTT 31  24 - 37 seconds  PROTIME-INR     Status: None   Collection Time    03/08/13  2:11 PM      Result Value Range   Prothrombin Time 13.1  11.6 - 15.2 seconds   INR 1.01  0.00 - 1.49  BASIC METABOLIC PANEL     Status: Abnormal   Collection Time    03/08/13  2:16 PM      Result Value Range   Sodium 141  135 - 145 mEq/L   Potassium 4.8  3.5 - 5.1 mEq/L   Chloride 105  96 - 112 mEq/L   CO2 28  19 - 32 mEq/L   Glucose, Bld 133 (*) 70 - 99 mg/dL   BUN 14  6 - 23 mg/dL   Creatinine, Ser 5.28  0.50 - 1.10 mg/dL   Calcium 9.6  8.4 - 41.3 mg/dL   GFR calc non Af Amer 54 (*) >90 mL/min   GFR calc Af Amer 63 (*) >90 mL/min   Comment:            The eGFR has been calculated     using the CKD EPI equation.     This calculation has not been     validated in all clinical     situations.     eGFR's persistently     <90 mL/min signify     possible Chronic Kidney Disease.  TROPONIN I     Status: None   Collection Time    03/08/13  6:43 PM      Result Value Range   Troponin I <  0.30  <0.30 ng/mL    Comment:            Due to the release kinetics of cTnI,     a negative result within the first hours     of the onset of symptoms does not rule out     myocardial infarction with certainty.     If myocardial infarction is still suspected,     repeat the test at appropriate intervals.    Dg Chest Portable 1 View  03/08/2013   *RADIOLOGY REPORT*  Clinical Data: Left hand numbness  PORTABLE CHEST - 1 VIEW  Comparison: None.  Findings: Cardiac leads overlie the chest.  Heart size is normal. Lung volumes are low but clear.  No pleural effusion.  No acute osseous finding.  Evidence of CABG noted.  Possible loose bodies noted in the left shoulder.  IMPRESSION: Low lung volumes with crowding of the bronchovascular markings but no focal acute finding.   Original Report Authenticated By: Christiana Pellant, M.D.     Assessment/Plan: 79 years old with multiple risk factors for stroke, admitted after sustaining a transient episode (30 MINUTES)  of numbness involving the left hand/forearm without other associated neurological findings. Neuro-exam non focal. No fully compliant with aspirin. I am concerned about a possible TIA involving sensory cortex left hemisphere. The patten-onset of her symptoms and lack of lateralizing neurological findings make the possibility of a radiculopathy or an entrapment neuropathy less likely. Subclavian steal syndrome also in the differential but also less probable. Agree with pursuing TIA work up and continuing antiplatelets and statin, goal LDH< 70. Will be happy to follow up with you.  Wyatt Portela, MD [facility] [phone]  03/08/2013, 9:59 PM

## 2013-03-08 NOTE — Progress Notes (Signed)
[facility] Attending Brief Admission Note: Sonya Levy MD [phone] pager office [phone] I  have  reviewed this patient's chart. I have discussed this patient with the resident.  I suspect her symptoms are related to TIA with isolated hand symptoms rather than subclavian steal. I appreciate vascular surgery consult and care and certainly think if they want angiogram we should proceed. I am going to consult Neuro as well in case these sx are more related to TIA. She has been on 81 mg ASA and for tonight until we can get formal neurology consult I will treat this as an aspirin failure and start plavix. Will start TIA workup with CT of head tonight. She has reportedly significant claustrophobia so we may have to consider sedation for MRI/ MRA of brain. Given EKG with LBBB and no old EKG to compare with, will cycle cardiac enzymes. She does have hx of CABG (done in her previous home of [place]) but has no symptoms of chest pain so this does not clinically seem to be overtly cardiac.full H&P to follow per Dr. Mikel Cella.

## 2013-03-08 NOTE — Consult Note (Addendum)
[facility] ADMIT NOTE 03/08/2013 DOB: 1925/10/05 MRN : [id number]  [id number] arm numbness distal to proximal  History of Present Illness: The patient states she had numbness in her left fingers that slowly spread up her arm.  This episode is new and started this morning at 10 am.   This lasted less than one hour.   She has no history of extremity numbness.  The numbness subsided after her husband massaged her arm.  Her sister told her to come to the ER today.  Contributing medical conditions include hypertension she is on a BB, hypercholesterolemia treated with a statin, DM and CAD.  Past Medical History  Diagnosis Date  . Hypertension   . Diabetes mellitus without complication     Past Surgical History  Procedure Laterality Date  . Quadruple bipass      Social History History  Substance Use Topics  . Smoking status: Never Smoker   . Smokeless tobacco: Not on file  . Alcohol Use: No    Family History Mother HTN, breast CA Died age 21 y.o. Sisters DM, Lung CA Father HTN Died 63 y.o.  Allergies  Allergen Reactions  . Crestor (Rosuvastatin) Other (See Comments)    Muscle cramps     Current Facility-Administered Medications  Medication Dose Route Frequency Provider Last Rate Last Dose  . losartan (COZAAR) tablet 100 mg  100 mg Oral Once Lyanne Co, MD      . metoprolol tartrate (LOPRESSOR) tablet 100 mg  100 mg Oral Once Lyanne Co, MD       Current Outpatient Prescriptions  Medication Sig Dispense Refill  . aspirin EC 81 MG tablet Take 81 mg by mouth daily.      . furosemide (LASIX) 40 MG tablet Take 40 mg by mouth daily.      Marland Kitchen glyBURIDE (DIABETA) 2.5 MG tablet Take 2.5 mg by mouth daily.       Marland Kitchen losartan (COZAAR) 100 MG tablet Take 100 mg by mouth daily.      . metFORMIN (GLUCOPHAGE) 1000 MG tablet Take 1,000 mg by mouth 2 (two) times daily with a meal.      . metoprolol (LOPRESSOR) 100 MG tablet Take 100 mg by mouth 2 (two) times  daily.      . potassium chloride SA (K-DUR,KLOR-CON) 20 MEQ tablet Take 20 mEq by mouth daily.      . pravastatin (PRAVACHOL) 20 MG tablet Take 20 mg by mouth daily.      . traMADol (ULTRAM) 50 MG tablet Take 1 tablet (50 mg total) by mouth every 6 (six) hours as needed for pain.  30 tablet  0    ROS: [x]  Positive  [ ]  Denies    General: [ ]  Weight loss, [ ]  Fever, [ ]  chills Neurologic: [ ]  Dizziness, [ ]  Blackouts, [ ]  Seizure [ ]  Stroke, [ ]  "Mini stroke", [ ]  Slurred speech, [ ]  Temporary blindness; [ ]  weakness in arms or legs, [ ]  Hoarseness [ ]  Dysphagia Cardiac: [ ]  Chest pain/pressure, [ ]  Shortness of breath at rest [ ]  Shortness of breath with exertion, [ ]  Atrial fibrillation or irregular heartbeat  Vascular: [ ]  Pain in legs with walking, [ ]  Pain in legs at rest, [x ] Pain in legs at night,  [ ]  Non-healing ulcer, [ ]  Blood clot in vein/DVT,   Pulmonary: [ ]  Home oxygen, [ ]  Productive cough, [ ]  Coughing up blood, [ ]  Asthma,  [ ]   Wheezing [ ]  COPD Musculoskeletal:  [ ]  Arthritis, [ ]  Low back pain, [ ]  Joint pain Hematologic: [ ]  Easy Bruising, [ ]  Anemia; [ ]  Hepatitis Gastrointestinal: [ ]  Blood in stool, [ ]  Gastroesophageal Reflux/heartburn,[x] nausea Urinary: [ ]  chronic Kidney disease, [ ]  on HD - [ ]  MWF or [ ]  TTHS, [ ]  Burning with urination, [ ]  Difficulty urinating Skin: [ ]  Rashes, [ ]  Wounds Psychological: [ ]  Anxiety, [ ]  Depression  Physical Examination Filed Vitals:   03/08/13 1230 03/08/13 1443 03/08/13 1525 03/08/13 1527  BP: 206/80 197/72 223/70 197/68  Pulse: 64 57 61 57  Temp:      TempSrc:      Resp: 20 18 18 20   Height:      Weight:      SpO2: 98% 98% 98% 98%   Body mass index is 26.51 kg/(m^2).  General:  WDWN in NAD Gait: Normal HENT: WNL, oropharynx with erythema without exudate Eyes: Pupils equal, EOMI, post-surgical R pupil changes Pulmonary: normal non-labored breathing , without Rales, rhonchi,  wheezing Cardiac: RRR, without   Murmurs, rubs or gallops;no carotid bruits Abdomen: soft, NT, no masses, no palpable AAA Skin: no rashes, ulcers noted Vascular Exam/Pulses: palpable femoral and L>R DP, PT not palpable, R brachial, radial and ulnar palpable; L ulnar palpable without palpable R radial or brachial pulse Extremities without ischemic changes, no Gangrene , no cellulitis; no open wounds; CR < 2 sec in R hand Musculoskeletal: no muscle wasting or atrophy  Neurologic: A&O X 3; Appropriate Affect ; SENSATION: normal; MOTOR FUNCTION: Pt has good and equal strength in all extremities - 5/5, Speech is fluent/normal, CN 2-12 intact Lymph: no cervical, inguinal or axillary LAD Psych: judgment intact, mood and affect appropriate for clinical situation   Imaging: Dg Chest Portable 1 View  03/08/2013   *RADIOLOGY REPORT*  Clinical Data: Left hand numbness  PORTABLE CHEST - 1 VIEW  Comparison: None.  Findings: Cardiac leads overlie the chest.  Heart size is normal. Lung volumes are low but clear.  No pleural effusion.  No acute osseous finding.  Evidence of CABG noted.  Possible loose bodies noted in the left shoulder.  IMPRESSION: Low lung volumes with crowding of the bronchovascular markings but no focal acute finding.   Original Report Authenticated By: Christiana Pellant, M.D.    Significant Diagnostic Studies: CBC Lab Results  Component Value Date   WBC 8.1 03/08/2013   HGB 13.9 03/08/2013   HCT 42.8 03/08/2013   MCV 83.6 03/08/2013   PLT 164 03/08/2013    BMET    Component Value Date/Time   NA 140 03/08/2013 1335   K 5.8* 03/08/2013 1335   CL 107 03/08/2013 1335   GLUCOSE 165* 03/08/2013 1335   BUN 21 03/08/2013 1335   CREATININE 1.10 03/08/2013 1335   Estimated Creatinine Clearance: 33.3 ml/min (by C-G formula based on Cr of 1.1).  COAG Lab Results  Component Value Date   INR 1.01 03/08/2013   No results found for this basename: PTT   Non-Invasive Vascular Imaging: B Carotid Duplex (03/08/2013)  B ICA stenosis <40%  R  vert antegrade and patent  L vert retrograde and patent  Incidentally: L SCA, axillary, brachial, ulnar, and radial monophasic with R arm pressures >> L  Study review and finalized by Dr. 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  Pt had one transient period of L arm numbness.  DDx includes TIA.  This patient's carotid studies are suspicious for left subclavian steal but she lack vertebrobasilar symptoms.  She also lacks substantial left arm "claudication" equivalent symptoms.  Subsequently, I would focus on optimization of her medical co-morbidities including her poorly controlled hypertension and treating her hyperkalemia if it turns out to be true on subsequent testing.  If she develops left arm numbness, fatigue, or weakness, without evidence of CVA, an Arch aortogram and left arm angiogram might be justified.  Given the increased risk of stroke with such a diagnostic study at the patient's age, at this point, I can't justify proceeding without additional events.  Additionally, I doubt she is a candidate for subclavian artery transposition or bypass at her age. I am available as needed during her hospitalization if her left arm exam should change.  She can also follow up as an outpatient as needed for further work-up of the likely left subclavian artery stenosis.    Leonides Sake,  MD [facility] Office: [phone] Pager: [phone]  03/08/2013, 6:59 PM

## 2013-03-09 ENCOUNTER — Inpatient Hospital Stay (HOSPITAL_COMMUNITY): Payer: Medicare Other

## 2013-03-09 ENCOUNTER — Encounter (HOSPITAL_COMMUNITY): Payer: Self-pay | Admitting: General Practice

## 2013-03-09 DIAGNOSIS — G459 Transient cerebral ischemic attack, unspecified: Principal | ICD-10-CM

## 2013-03-09 DIAGNOSIS — I2510 Atherosclerotic heart disease of native coronary artery without angina pectoris: Secondary | ICD-10-CM

## 2013-03-09 DIAGNOSIS — I10 Essential (primary) hypertension: Secondary | ICD-10-CM

## 2013-03-09 DIAGNOSIS — I517 Cardiomegaly: Secondary | ICD-10-CM

## 2013-03-09 LAB — CBC
HCT: 40.9 % (ref 36.0–46.0)
MCH: 27 pg (ref 26.0–34.0)
MCV: 83.1 fL (ref 78.0–100.0)
Platelets: 204 10*3/uL (ref 150–400)
RBC: 4.92 MIL/uL (ref 3.87–5.11)

## 2013-03-09 LAB — BASIC METABOLIC PANEL
CO2: 27 mEq/L (ref 19–32)
Calcium: 9.9 mg/dL (ref 8.4–10.5)
Glucose, Bld: 128 mg/dL — ABNORMAL HIGH (ref 70–99)
Sodium: 142 mEq/L (ref 135–145)

## 2013-03-09 LAB — LIPID PANEL
LDL Cholesterol: 120 mg/dL — ABNORMAL HIGH (ref 0–99)
Total CHOL/HDL Ratio: 3.4 RATIO
VLDL: 16 mg/dL (ref 0–40)

## 2013-03-09 LAB — TROPONIN I: Troponin I: <0.3 ng/mL (ref <0.30)

## 2013-03-09 LAB — HEMOGLOBIN A1C: Hgb A1c MFr Bld: 6.9 % — ABNORMAL HIGH (ref <5.7)

## 2013-03-09 MED ORDER — LORAZEPAM 2 MG/ML IJ SOLN
INTRAMUSCULAR | Status: AC
  Filled 2013-03-09: qty 1

## 2013-03-09 MED ORDER — PRAVASTATIN SODIUM 40 MG PO TABS
40.0000 mg | ORAL_TABLET | Freq: Every day | ORAL | Status: DC
  Administered 2013-03-09: 40 mg via ORAL
  Filled 2013-03-09: qty 1

## 2013-03-09 MED ORDER — CLOPIDOGREL BISULFATE 75 MG PO TABS: 75.0000 mg | ORAL_TABLET | Freq: Every day | ORAL | Status: DC

## 2013-03-09 MED ORDER — DILTIAZEM HCL ER 60 MG PO CP12
60.0000 mg | ORAL_CAPSULE | Freq: Two times a day (BID) | ORAL | Status: DC
  Filled 2013-03-09: qty 1

## 2013-03-09 MED ORDER — PRAVASTATIN SODIUM 40 MG PO TABS: 40.0000 mg | ORAL_TABLET | Freq: Every day | ORAL | Status: DC

## 2013-03-09 MED ORDER — METOPROLOL TARTRATE 50 MG PO TABS: 50.0000 mg | ORAL_TABLET | Freq: Two times a day (BID) | ORAL | Status: DC

## 2013-03-09 MED ORDER — METOPROLOL TARTRATE 50 MG PO TABS
50.0000 mg | ORAL_TABLET | Freq: Two times a day (BID) | ORAL | Status: DC
Start: 2013-03-09 — End: 2013-03-09
  Administered 2013-03-09: 50 mg via ORAL
  Filled 2013-03-09 (×2): qty 1

## 2013-03-09 MED ORDER — LORAZEPAM 2 MG/ML IJ SOLN
1.0000 mg | Freq: Once | INTRAMUSCULAR | Status: AC
  Administered 2013-03-09: 1 mg via INTRAVENOUS

## 2013-03-09 MED ORDER — DILTIAZEM HCL 30 MG PO TABS
30.0000 mg | ORAL_TABLET | Freq: Four times a day (QID) | ORAL | Status: DC
  Administered 2013-03-09: 30 mg via ORAL
  Filled 2013-03-09 (×3): qty 1

## 2013-03-09 NOTE — Progress Notes (Signed)
Family Medicine Teaching Service Daily Progress Note Intern Pager: [phone]  Patient name: Sonya Chavez Medical record number: [id number] Date of birth: [birth year] Age: 79 y.o. Gender: female  Primary Care Provider: Dois Davenport., MD Consultants: Vascular Surgery, Neurology  Code Status: Full   Pt Overview and Major Events to Date:  8/4- Bradycardic in 40-50's with PVC overnight    Assessment and Plan: Sonya Chavez is a 79 y.o. year old female presenting with left hand numbness, concerning for subclavian steal syndrome vs. TIA.   # Left Hand Numbness (suspect subclavian steal syndrome)  New-onset L-hand/forearm numbness w/o any other associated sx, which has since resolved. No prior hx of similar sx. L-radial / ulnar pulses significantly diminished, w/o edema, pain, or significant loss of warmth. Evaluated by doppler US in ED, preliminary report suggestions subclavian steal syndrome.  Diff Dx: TIA vs. Acute CVA (possible - resolved; no other neuro sx, has risk factors (CAD, HTN, DM), on daily ASA)  - monitor for recurrent symptoms  [ ]  f/u Head CT w/o contrast - start Plavix 75mg  daily due to possible failure of ASA  - risk stratification labs -TSH, A1C, Lipid panel in AM-->45.9% calculated risk  - Will consult Neurology for further recommendations  - Vascular Surgery:  - doppler LUE / Carotid: suggests subclavian steal with L-vert artery retrograde flow  [ ]  if recurrent L-arm sx (numbness / weakness) w/o evidence of stroke - will consider Arch Aortogram with L-arm Angiogram. Caution due to increased risk of stroke with this test. Unlikely candidate for subclavian artery transposition / bypass due to age  - NPO after midnight, but allow to eat if not having further procedures tomorrow.   # HTN Urgency  Significantly elevated BPs upon arrival in ED (225/68) had been without BP meds all day, given Metoprolol and Amlodipine in ED with minimal response, BP range  (159-235 / 68-88). No associated sx of CP, HA, dizziness, weakness, neurological changes.  - monitor for HTN Emergency w/ any new sx that might suggest end organ damage (MI, Stroke, AKI)  - serial troponin's negative    - repeat EKG in AM, bradycardia 40's-50's with PVC  - restarted home BP meds: Metoprolol 100mg  BID-->changed to 50 mg BID due to bradycardia, Losartan 100mg  daily, Lasix 40mg   - Hydralazine 10mg  IV q 4 hr PRN (systolic > 180)   # LBBB, unknown onset  Discovered on initial EKG in ED. No prior EKGs for comparison. Patient does have +Cardiac Hx with CAD s/p CABG (2003). Concern that new-onset LBBB is sign of ACS, but with known CAD this is likely not pertinent finding. However, no concerning sx of CP, SoB, weakness, generalized malaise/nausea. Only symptom of L-arm numbness has resolved (< 1 hour).  - repeat EKG in AM  - cardiac work-up including troponins  [ ]  consider Cardiology c/s if symptoms worsen (Of note, patient has significant cards history and is not established in this area, but would likely benefit from outpatient cardiology appointment)   # CAD s/p CABG 4x vessel (2003)  Existing CAD with additional risk factors (age 79, AA race, HTN, DM).  - risk stratification: ordered HgA1c, TSH, Lipid panel  - continue ASA 81mg  daily as dual antiplatelet for now but can possibly d/c if on Plavix long term   # DM2  Reportedly well-controlled on PO meds (Metformin, Glyburide). Daily BS ranges (80s-160).  - dc'd home POs including Metformin while in hospital, due to potential angiogram w/ contrast  - Sensitive SSI  -  CBG qac/qhs   FEN/GI: NS 145ml/hr, heart healthy diet  Prophylaxis: SQ Heparin   Disposition: Home, pending clinical course   Subjective: Patient sitting up in chair. Reports no problems and feeling hungry and wanting to eat.   Objective: Temp:  [97.6 F (36.4 C)-98.9 F (37.2 C)] 97.6 F (36.4 C) (08/05 0403) Pulse Rate:  [53-67] 53 (08/05 0403) Resp:   [17-22] 18 (08/05 0403) BP: (134-235)/(56-88) 134/80 mmHg (08/05 0403) SpO2:  [96 %-100 %] 98 % (08/05 0403) Weight:  [136 lb 14.5 oz (62.1 kg)-145 lb (65.772 kg)] 136 lb 14.5 oz (62.1 kg) (08/04 1734) Physical Exam: General: NAD, pleasant  Cardiovascular: S1S2, no mrg, bradycardic  Respiratory: CTAB, no wheezes/crackles  Abdomen: +BS, SNTND Extremities: L-arm thready radial/ulnar pulses, all other +2  Neuro: grossly non-focal, muscle strength bil 5/5  Laboratory:  Recent Labs Lab 03/08/13 1335 03/08/13 1411 03/09/13 0500  WBC  --  8.1 8.8  HGB 15.6* 13.9 13.3  HCT 46.0 42.8 40.9  PLT  --  164 204    Recent Labs Lab 03/08/13 1335 03/08/13 1416 03/09/13 0500  NA 140 141 142  K 5.8* 4.8 4.1  CL 107 105 104  CO2  --  28 27  BUN 21 14 15   CREATININE 1.10 0.93 0.93  CALCIUM  --  9.6 9.9  GLUCOSE 165* 133* 128*     Recent Labs Lab 03/08/13 1843 03/09/13 0022 03/09/13 0500  TROPONINI <0.30 <0.30 <0.30    Recent Labs Lab 03/09/13 0846  GLUCAP 160*   Cholesterol: 193 Tri: 78 HDL: 78 LDL: 120 VLDL: 16 Hgb A1C: 6.9   Imaging/Diagnostic Tests: 8/5: ECHO  8/5: MRI HEAD WITHOUT CONTRAST  MRA HEAD WITHOUT CONTRAST Comparison: CT head 03/08/2013 IMPRESSION:  Mild chronic ischemic change. No acute abnormality.  MRA HEAD IMPRESSION:  Mild stenosis of the mid basilar. No large vessel occlusion.  8/4: Doppler Carotid:  Summary: Findings suggest 1-39% internal carotid artery stenosis bilaterally. The right vertebral artery is patent with elevated velocities, suggestive of compensatory flow versus stenosis. The right subclavian artery is patent with biphasic flow. The left vertebral artery is patent with retrograde flow. The left subclavian artery is patent with monophasic flow; this along with the left vertebral artery findings are suggestive of a subclavian steal.  There is an 11 point pressure difference between the right brachial artery and the  right radial artery, and a 73 point pressure difference between the right brachail artery and the right ulnar artery. This may be due to stenosisversustechnical error, as these arteries were difficult to insonate.  8/4: upper extremity arterial duplex   8/4:CT HEAD WITHOUT CONTRAST IMPRESSION:  Chronic small vessel ischemic disease. No acute intracranial  process identified.  8/4:PORTABLE CHEST - 1 VIEW IMPRESSION:  Low lung volumes with crowding of the bronchovascular markings but  no focal acute finding.  Clare Gandy, MD 03/09/2013, 9:20 AM PGY-1, [facility] Family Medicine FPTS Intern pager: [phone], text pages welcome

## 2013-03-09 NOTE — Progress Notes (Signed)
Went and visited at bedside. She was sitting upright in a chair. She was able to drink water through a straw and proceeded to talk afterwards with no gurgling or dribbling.   Clare Gandy, MD  PGY-1 Family Medicine  03/09/13

## 2013-03-09 NOTE — Progress Notes (Signed)
Echo Lab  2D Echocardiogram completed.  Dierdre Mccalip L Aryonna Gunnerson, RDCS 03/09/2013 11:04 AM

## 2013-03-09 NOTE — H&P (Signed)
FMTS Attending Admission Note: Denny Levy MD [phone] pager office [phone] I  have seen and examined this patient, reviewed their chart. I have discussed this patient with the resident. I agree with the resident's findings, assessment and care plan. With the following additions and clarifications: 1: Left hand and arm numbness most consistent with TIA. We have started TIA workup and order set, Neurology hasseen patient. We have started Plavix although unclear if she was truly compliant with ASA regimen.  Symptoms have not returned. I would potentially favor plavix over ASA but I think if her compliance is really in question, we could consider daily ASA. I know of no evidence that increasing ASA dose from 81 mg to 325 is proven beneficial. 2, HTN--in setting of hand numbness that I think was TIA this could qualify as hypertensive emergency but we hesitated to decrease BP a lot gvien setting of TIA. Her BP has remained higher than I would like--I do not have her home baseline but I think we need to be cautious about strict BP control. We will see where she is later this Am with her meds given appropriately but I would tend to favor 180 or so as a goal (unless we find ou ther home BPs have been in 200s systolic for a long time, then I would increase the number) 3. Re her left wrist pulse, there IS a huge disparity on exam. She is right hand dominant and other than presenting symptoms reports no history of subclavian steal symptoms. I will leavethe decision for further work up to vascular surgery. I do not feel it is urgent.  4. LBBB: I suspect it is old finding. Cycling cardiac enzymes. 5. CONSULTS: Thanks to vascular surgery and neurology.I appreciate their input and help in care of this very pleasant female.

## 2013-03-09 NOTE — Care Management Note (Unsigned)
    Page 1 of 1   03/09/2013     3:48:43 PM   CARE MANAGEMENT NOTE 03/09/2013  Patient:  NINFA, GIANNELLI   Account Number:  [id number]  Date Initiated:  03/09/2013  Documentation initiated by:  Priyanka Causey  Subjective/Objective Assessment:   PT ADM ON 03/08/13 WITH NUMBNESS IN ARM, HAND; ?TIA.   PTA, PT RESIDES AT HOME WITH SPOUSE.     Action/Plan:   PT/OT STATE PT AT BASELINE.  NO HOME THERAPY RECOMMENDED. WILL CONT TO FOLLOW FOR DC NEEDS.   Anticipated DC Date:  03/10/2013   Anticipated DC Plan:  HOME/SELF CARE      DC Planning Services  CM consult      Choice offered to / List presented to:             Status of service:  In process, will continue to follow Medicare Important Message given?   (If response is "NO", the following Medicare IM given date fields will be blank) Date Medicare IM given:   Date Additional Medicare IM given:    Discharge Disposition:    Per UR Regulation:  Reviewed for med. necessity/level of care/duration of stay  If discussed at Long Length of Stay Meetings, dates discussed:    Comments:

## 2013-03-09 NOTE — Progress Notes (Signed)
I examined this patient and discussed the care plan with Dr Schmitz and the FPTS team and agree with assessment and plan as documented in the progress note above.  

## 2013-03-09 NOTE — Progress Notes (Signed)
Called CN on 4 [place] to see patient for STROKE discharge instructions/ information.  CN currently admitting new patients and will see patient as soon as possible.  Will continue to monitor. Thomas Hoff

## 2013-03-09 NOTE — Progress Notes (Addendum)
Occupational Therapy Evaluation Patient Details Name: Sonya Chavez MRN: [id number] DOB: 02/01/1926 Today's Date: 03/09/2013 Time: 4098-1191 OT Time Calculation (min): 23 min  OT Assessment / Plan / Recommendation History of present illness   79 y.o. year old female presenting with left hand numbness, concerning for subclavian steal syndrome vs. TIA.     Clinical Impression   PTA, pt independent with ADL and mobility. Pt does not have any deficits at this time, with the exception of mild coordination deficits, which are most likely premorbid. Pt safe to D/C home with intermittent S of family. Recommended use of shower chair and discussed home safety modifications with family. No further OT needed at this time. Pt ready for D/C when medically stable. Pt given handout on warning signs/symptoms of CVA.    OT Assessment  Patient does not need any further OT services    Follow Up Recommendations  No OT follow up    Barriers to Discharge  none    Equipment Recommendations  Tub/shower seat    Recommendations for Other Services    Frequency       Precautions / Restrictions Precautions Precautions: Fall   Pertinent Vitals/Pain no apparent distress     ADL  Tub/Shower Transfer: Set up Equipment Used: Gait belt Transfers/Ambulation Related to ADLs: mod I ADL Comments: overall S to mod I for ADL    OT Diagnosis:    OT Problem List:   OT Treatment Interventions:     OT Goals(Current goals can be found in the care plan section) Acute Rehab OT Goals OT Goal Formulation:  (eval only)  Visit Information  Last OT Received On: 03/09/13 Assistance Needed: +1       Prior Functioning     Home Living Family/patient expects to be discharged to:: Private residence Living Arrangements: Spouse/significant other Available Help at Discharge: Family Type of Home: House Home Access: Stairs to enter Secretary/administrator of Steps: 2 Home Layout: One level Home Equipment:  Cane - single point Prior Function Level of Independence: Independent Dominant Hand: Right         Vision/Perception Vision - History Baseline Vision: Wears glasses all the time Perception Perception: Within Functional Limits Praxis Praxis: Intact   Cognition  Cognition Arousal/Alertness: Awake/alert Behavior During Therapy: Flat affect Overall Cognitive Status: Within Functional Limits for tasks assessed (family states cognitive decline that has been gradual)    Extremity/Trunk Assessment Upper Extremity Assessment Upper Extremity Assessment: Overall WFL for tasks assessed (mild coordinaiton deficits) Lower Extremity Assessment Lower Extremity Assessment: Defer to PT evaluation Cervical / Trunk Assessment Cervical / Trunk Assessment: Normal     Mobility Bed Mobility Bed Mobility: Supine to Sit Supine to Sit: 6: Modified independent (Device/Increase time) Transfers Transfers: Sit to Stand;Stand to Sit Sit to Stand: 6: Modified independent (Device/Increase time) Stand to Sit: 6: Modified independent (Device/Increase time)     Exercise     Balance Balance Balance Assessed:  (see PT eval)   End of Session OT - End of Session Equipment Utilized During Treatment: Gait belt Activity Tolerance: Patient tolerated treatment well Patient left: in chair;with call bell/phone within reach;with family/visitor present Nurse Communication: Mobility status  GO     Sonya Chavez,HILLARY 03/09/2013, 1:10 PM [facility], OTR/L  [phone] 03/09/2013

## 2013-03-09 NOTE — Discharge Summary (Signed)
I discussed the care plan with Dr Schmitz and the FPTS team and agree with assessment and plan as documented in the discharge note above.  

## 2013-03-09 NOTE — Evaluation (Signed)
Physical Therapy Evaluation Patient Details Name: Sonya Chavez MRN: [id number] DOB: 1926/01/24 Today's Date: 03/09/2013 Time: 4098-1191 PT Time Calculation (min): 24 min  PT Assessment / Plan / Recommendation History of Present Illness    79 y.o. year old female presenting with left hand numbness, concerning for subclavian steal syndrome vs. TIA.   Clinical Impression  Pt at baseline at this time, with premorbid coordination deficits. Currently ambulating at independent levels. No further acute care needs at this time. PT will sign off.    PT Assessment  Patent does not need any further PT services             Equipment Recommendations  None recommended by PT          Precautions / Restrictions Precautions Precautions: Fall   Pertinent Vitals/Pain No pain      Mobility  Bed Mobility Bed Mobility: Supine to Sit Supine to Sit: 6: Modified independent (Device/Increase time) Transfers Transfers: Sit to Stand;Stand to Sit Sit to Stand: 6: Modified independent (Device/Increase time) Stand to Sit: 6: Modified independent (Device/Increase time) Ambulation/Gait Ambulation/Gait Assistance: 7: Independent Ambulation Distance (Feet): 400 Feet Assistive device: None Gait Pattern: Within Functional Limits Stairs: Yes Stairs Assistance: 5: Supervision Stair Management Technique: One rail Right;Alternating pattern;Forwards Number of Stairs: 6        PT Goals(Current goals can be found in the care plan section) Acute Rehab PT Goals Patient Stated Goal: to go home PT Goal Formulation: With patient Time For Goal Achievement: 03/16/13 Potential to Achieve Goals: Good  Visit Information  Last PT Received On: 03/09/13 Assistance Needed: +1       Prior Functioning  Home Living Family/patient expects to be discharged to:: Private residence Living Arrangements: Spouse/significant other Available Help at Discharge: Family Type of Home: House Home Access: Stairs  to enter Secretary/administrator of Steps: 2 Home Layout: One level Home Equipment: Cane - single point Prior Function Level of Independence: Independent Dominant Hand: Right    Cognition  Cognition Arousal/Alertness: Awake/alert Behavior During Therapy: Flat affect Overall Cognitive Status: Within Functional Limits for tasks assessed (family states cognitive decline that has been gradual)    Extremity/Trunk Assessment Upper Extremity Assessment Upper Extremity Assessment: Overall WFL for tasks assessed (mild coordinaiton deficits) Lower Extremity Assessment Lower Extremity Assessment: Overall WFL for tasks assessed Cervical / Trunk Assessment Cervical / Trunk Assessment: Normal   Balance Balance Balance Assessed:  (see PT eval) High Level Balance High Level Balance Activites: Side stepping;Backward walking;Direction changes;Turns;Head turns High Level Balance Comments: steady with ambulation, modest deficits with higher level balance activities, may benefit from use of SPC initially  End of Session PT - End of Session Equipment Utilized During Treatment: Gait belt Activity Tolerance: Patient tolerated treatment well Patient left: in chair;with call bell/phone within reach;with family/visitor present Nurse Communication: Mobility status  GP     Fabio Asa 03/09/2013, 1:56 PM Charlotte Crumb, PT DPT  [phone]

## 2013-03-09 NOTE — Progress Notes (Signed)
Stroke Team Progress Note  HISTORY [place] Sonya Chavez is an 79 y.o. female, right handed, with a significant past medical history significant for hypertension, DM, CAD s/p CABG, admitted to [facility] today due to new onset let hand/forearm numbness.  Stated that she never had similar symptoms before. She went to and awoke feeling well, but around 10 am developed sudden onset of numbness involving the fingers left hand which rapidly traveled to the forearm and stopped around the elbow. She said that " it happened all over my left hand and forearm" and dissipated around 30 minutes later. Denies associated weakness, headache, neck pain, vertigo, double vision, difficulty swallowing, unsteadiness, confusion, slurred speech, language or vision impairment. CT brain upon arrival to the ED showed no acute intracranial abnormality.   Absent left radial and ulnar pulsed detected in the ED and this was subsequently valuated by doppler US in ED, preliminary report suggestions subclavian steal syndrome. She said that she " sometimes" forgets to take her aspirin. Evaluation in ED was described as patient being asymptomatic.  SUBJECTIVE Her family is at the bedside.  Overall she feels her condition is rapidly improving.   OBJECTIVE Most recent Vital Signs: Filed Vitals:   03/09/13 0403 03/09/13 1123 03/09/13 1211 03/09/13 1345  BP: 134/80 189/66 176/66 157/59  Pulse: 53 57 57 60  Temp: 97.6 F (36.4 C) 97.3 F (36.3 C)  97.5 F (36.4 C)  TempSrc: Oral Oral  Oral  Resp: 18 18  18   Height:      Weight:      SpO2: 98% 98%  98%   CBG (last 3)   Recent Labs  03/09/13 0846 03/09/13 1122  GLUCAP 160* 138*    IV Fluid Intake:   . sodium chloride      MEDICATIONS  . aspirin EC  81 mg Oral Daily  . clopidogrel  75 mg Oral Q breakfast  . diltiazem  30 mg Oral Q6H  . furosemide  40 mg Oral Daily  . heparin  5,000 Units Subcutaneous Q8H  . insulin aspart  0-9 Units Subcutaneous TID WC  . losartan  100  mg Oral Once  . metoprolol tartrate  50 mg Oral BID  . potassium chloride SA  20 mEq Oral Daily  . pravastatin  40 mg Oral q1800  . sodium chloride  3 mL Intravenous Q12H   PRN:  docusate sodium, hydrALAZINE, ondansetron (ZOFRAN) IV, ondansetron, traMADol  Diet:  Carb Control thin liquids Activity:  Up as tolerated DVT Prophylaxis:  heparin  CLINICALLY SIGNIFICANT STUDIES Basic Metabolic Panel:  Recent Labs Lab 03/08/13 1416 03/09/13 0500  NA 141 142  K 4.8 4.1  CL 105 104  CO2 28 27  GLUCOSE 133* 128*  BUN 14 15  CREATININE 0.93 0.93  CALCIUM 9.6 9.9   Liver Function Tests: No results found for this basename: AST, ALT, ALKPHOS, BILITOT, PROT, ALBUMIN,  in the last 168 hours CBC:  Recent Labs Lab 03/08/13 1411 03/09/13 0500  WBC 8.1 8.8  HGB 13.9 13.3  HCT 42.8 40.9  MCV 83.6 83.1  PLT 164 204   Coagulation:  Recent Labs Lab 03/08/13 1411  LABPROT 13.1  INR 1.01   Cardiac Enzymes:  Recent Labs Lab 03/08/13 1843 03/09/13 0022 03/09/13 0500  TROPONINI <0.30 <0.30 <0.30   Urinalysis: No results found for this basename: COLORURINE, APPERANCEUR, LABSPEC, PHURINE, GLUCOSEU, HGBUR, BILIRUBINUR, KETONESUR, PROTEINUR, UROBILINOGEN, NITRITE, LEUKOCYTESUR,  in the last 168 hours Lipid Panel    Component Value Date/Time  CHOL 193 03/09/2013 0500   TRIG 78 03/09/2013 0500   HDL 57 03/09/2013 0500   CHOLHDL 3.4 03/09/2013 0500   VLDL 16 03/09/2013 0500   LDLCALC 120* 03/09/2013 0500   HgbA1C  Lab Results  Component Value Date   HGBA1C 6.9* 03/08/2013    Urine Drug Screen:   No results found for this basename: labopia, cocainscrnur, labbenz, amphetmu, thcu, labbarb    Alcohol Level: No results found for this basename: ETH,  in the last 168 hours  Ct Head Wo Contrast 03/09/2013   Chronic small vessel ischemic disease.  No acute intracranial process identified.   Mr Maxine Glenn Head Wo Contrast 03/09/2013    Mild stenosis of the mid basilar.  No large vessel occlusion. .   Mr  Brain Wo Contrast 03/09/2013   Negative for acute infarct.  Chronic microvascular ischemic changes in the white matter.  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  Ventricle size is normal.  Age appropriate generalized atrophy.  Chronic micro hemorrhage in the left caudate and right temporal lobe.  This may be due to chronic hypertension.  Negative for mass or edema.  No shift of the midline structures.  Paranasal sinuses are clear.   Dg Chest Portable 1 View 03/08/2013  : Low lung volumes with crowding of the bronchovascular markings but no focal acute finding.   2D Echocardiogram   EF 55%. , Mobile interatrial septum. A PFO cannot be excluded.  Carotid Doppler  Carotid Duplex (Doppler) and bilateral upper extremity arterial doppler has been completed.  Findings suggest 1-39% internal carotid artery stenosis bilaterally. The right vertebral artery is patent with elevated velocities, suggestive of compensatory flow vs stenosis. The right subclavian artery is patent with biphasic flow. The left vertebral artery is patent with retrograde flow. The left subclavian artery is patent with monophasic flow; this along with the left vertebral artery findings are suggestive of a subclavian steal. There is an 11 point pressure difference between the right brachial artery and the right radial artery, and a 73 point pressure difference between the right brachial artery and right ulnar artery. This may be due to stenosis vs technical error, as the radial and ulnar arteries were difficult to insonate.  CXR    EKG  .   Therapy Recommendations NO OT/PT  Physical Exam   Pleasant elderly Caucasian lady currently not in distress.Awake alert. Afebrile. Head is nontraumatic. Neck is supple without bruit. Hearing is normal. Cardiac exam no murmur or gallop. Lungs are clear to auscultation. Distal pulses are well felt.  Neurological Exam ;  Awake  Alert oriented x 3. Normal speech and language.eye movements  full without nystagmus.fundi were not visualized. Vision acuity and fields appear normal. Hearing is normal. Palatal movements are normal. Face symmetric. Tongue midline. Normal strength, tone, reflexes and coordination. Normal sensation. Gait deferred.    ASSESSMENT Sonya Chavez is a 79 y.o. female presenting with left hand/arm sensory loss for 30 minutes. Concern for left subclavian steal syndrome and patient was seen by [facility]. Imaging confirms a no acute  Infarct.TIA felt secondary to small vessel disease.  On aspirin 81 mg orally every day prior to admission. Now on aspirin 81 mg orally every day and clopidogrel 75 mg orally every day for secondary stroke prevention. Patient with resultant transient symptoms, now resolved. Work up underway.   Hypertension  Diabetes mellitus, hgb a1c 6.9 (goal < 6.5)  Hyperlipidemia, LDL 120, goal < 70 in diabetics  Hospital day # 1  TREATMENT/PLAN  Continue aspirin 81 mg orally every day and clopidogrel 75 mg orally every day for secondary stroke prevention.  Change patient to lipitor if patient has no history of intolerance in the past.  Risk factor modification  Echo results reviewed with Dr. Pearlean Brownie. No further work up indicated.  Sonya Lima. Manson Chavez, [facility], MBA, MHA [facility] Pager: [phone] 03/09/2013 3:12 PM  I have personally obtained a history, examined the patient, evaluated imaging results, and formulated the assessment and plan of care. I agree with the above. Delia Heady, MD

## 2013-06-03 ENCOUNTER — Encounter (HOSPITAL_COMMUNITY): Payer: Self-pay | Admitting: Emergency Medicine

## 2013-06-03 ENCOUNTER — Emergency Department (HOSPITAL_COMMUNITY)
Admission: EM | Admit: 2013-06-03 | Discharge: 2013-06-03 | Disposition: A | Discharge status: Home / Self Care | Payer: Medicare Other | Attending: Emergency Medicine | Admitting: Emergency Medicine

## 2013-06-03 ENCOUNTER — Emergency Department (HOSPITAL_COMMUNITY): Payer: Medicare Other

## 2013-06-03 DIAGNOSIS — Z8639 Personal history of other endocrine, nutritional and metabolic disease: Secondary | ICD-10-CM | POA: Insufficient documentation

## 2013-06-03 DIAGNOSIS — Z79899 Other long term (current) drug therapy: Secondary | ICD-10-CM | POA: Insufficient documentation

## 2013-06-03 DIAGNOSIS — Z862 Personal history of diseases of the blood and blood-forming organs and certain disorders involving the immune mechanism: Secondary | ICD-10-CM | POA: Insufficient documentation

## 2013-06-03 DIAGNOSIS — G458 Other transient cerebral ischemic attacks and related syndromes: Secondary | ICD-10-CM | POA: Insufficient documentation

## 2013-06-03 DIAGNOSIS — Z7982 Long term (current) use of aspirin: Secondary | ICD-10-CM | POA: Insufficient documentation

## 2013-06-03 DIAGNOSIS — I10 Essential (primary) hypertension: Secondary | ICD-10-CM | POA: Insufficient documentation

## 2013-06-03 DIAGNOSIS — E119 Type 2 diabetes mellitus without complications: Secondary | ICD-10-CM | POA: Insufficient documentation

## 2013-06-03 HISTORY — DX: Pure hypercholesterolemia, unspecified: E78.00

## 2013-06-03 LAB — COMPREHENSIVE METABOLIC PANEL
ALT: 17 U/L (ref 0–35)
AST: 24 U/L (ref 0–37)
Albumin: 4 g/dL (ref 3.5–5.2)
Alkaline Phosphatase: 110 U/L (ref 39–117)
BUN: 17 mg/dL (ref 6–23)
Calcium: 9.6 mg/dL (ref 8.4–10.5)
Potassium: 4 mEq/L (ref 3.5–5.1)
Sodium: 143 mEq/L (ref 135–145)
Total Protein: 7.5 g/dL (ref 6.0–8.3)

## 2013-06-03 LAB — CBC WITH DIFFERENTIAL/PLATELET
Eosinophils Relative: 0 % (ref 0–5)
HCT: 39.6 % (ref 36.0–46.0)
Hemoglobin: 12.7 g/dL (ref 12.0–15.0)
Lymphocytes Relative: 22 % (ref 12–46)
Lymphs Abs: 2.1 10*3/uL (ref 0.7–4.0)
MCV: 83.5 fL (ref 78.0–100.0)
Monocytes Absolute: 0.9 10*3/uL (ref 0.1–1.0)
Monocytes Relative: 9 % (ref 3–12)
RBC: 4.74 MIL/uL (ref 3.87–5.11)
WBC: 9.5 10*3/uL (ref 4.0–10.5)

## 2013-06-03 LAB — URINE MICROSCOPIC-ADD ON

## 2013-06-03 LAB — URINALYSIS, ROUTINE W REFLEX MICROSCOPIC
Glucose, UA: NEGATIVE mg/dL
Ketones, ur: NEGATIVE mg/dL
Leukocytes, UA: NEGATIVE
Urobilinogen, UA: 0.2 mg/dL (ref 0.0–1.0)
pH: 7 (ref 5.0–8.0)

## 2013-06-03 MED ORDER — HYDRALAZINE HCL 20 MG/ML IJ SOLN
10.0000 mg | Freq: Once | INTRAMUSCULAR | Status: AC
  Administered 2013-06-03: 10 mg via INTRAVENOUS
  Filled 2013-06-03: qty 1

## 2013-06-03 MED ORDER — LABETALOL HCL 5 MG/ML IV SOLN: 20.0000 mg | Freq: Once | INTRAVENOUS | Status: DC

## 2013-06-03 NOTE — ED Notes (Addendum)
Pt was seen at her pcp's for a regular check-up.  She was hypertensive 282/83 and her R bp was always greater than her left.  Her pcp told her to come to the ED.  Denies any pain or discomfort.  BP R arm was 250/82 and L arm was 154/89 in triage.

## 2013-06-03 NOTE — ED Notes (Signed)
Pt given d/c instructions and verbalized understanding. NAD at this time. VS are stable.  

## 2013-06-03 NOTE — ED Provider Notes (Signed)
CSN: [id number]     Arrival date & time 06/03/13  1717 History   First MD Initiated Contact with Patient 06/03/13 1804     Chief Complaint  Patient presents with  . Hypertension   (Consider location/radiation/quality/duration/timing/severity/associated sxs/prior Treatment) The history is provided by the patient. No language interpreter was used.  Patient is a 79 y.o. African American female with past medical history diabetes hypertension who comes emergency department today for hypertension. She was evaluated by her primary care physician who noted that her blood pressures in both arms. The right arm had a blood pressure of 220/80 in the left arm had a blood pressure of 130/60.  The patient does not know if this is new or not. She denies any pain. She denies chest pain, back pain, or headache. She does not have shortness of breath. No modifying factors.  She was at her primary care doctor's clinic for a regular checkup.   Past Medical History  Diagnosis Date  . Hypertension   . Diabetes mellitus without complication   . Hypercholesteremia    Past Surgical History  Procedure Laterality Date  . Coronary artery bypass graft      2003 in [place], [place]  . Abdominal hysterectomy      1970   No family history on file. History  Substance Use Topics  . Smoking status: Never Smoker   . Smokeless tobacco: Never Used  . Alcohol Use: No   OB History   Grav Para Term Preterm Abortions TAB SAB Ect Mult Living                 Review of Systems  Constitutional: Negative for fever and chills.  Respiratory: Negative for cough and shortness of breath.   Cardiovascular: Negative for chest pain.  Gastrointestinal: Negative for nausea and vomiting.  Genitourinary: Negative for dysuria, urgency and frequency.  Neurological: Negative for headaches.  All other systems reviewed and are negative.    Allergies  Crestor; Lipitor; and Zocor  Home Medications   Current Outpatient Rx  Name   Route  Sig  Dispense  Refill  . acetaminophen-codeine (TYLENOL #3) 300-30 MG per tablet   Oral   Take 1 tablet by mouth every 4 (four) hours as needed for pain.         Marland Kitchen aspirin EC 81 MG tablet   Oral   Take 81 mg by mouth daily.         . clopidogrel (PLAVIX) 75 MG tablet   Oral   Take 1 tablet (75 mg total) by mouth daily with breakfast.   30 tablet   0   . furosemide (LASIX) 40 MG tablet   Oral   Take 40 mg by mouth daily.         Marland Kitchen gabapentin (NEURONTIN) 100 MG capsule   Oral   Take 200 mg by mouth at bedtime.         Marland Kitchen losartan (COZAAR) 100 MG tablet   Oral   Take 100 mg by mouth daily.         . metFORMIN (GLUCOPHAGE) 1000 MG tablet   Oral   Take 1,000 mg by mouth 2 (two) times daily with a meal.         . metoprolol (LOPRESSOR) 50 MG tablet   Oral   Take 1 tablet (50 mg total) by mouth 2 (two) times daily.   60 tablet   0   . potassium chloride SA (K-DUR,KLOR-CON) 20 MEQ tablet  Oral   Take 20 mEq by mouth daily.          BP 154/89  Pulse 52  Temp(Src) 97.8 F (36.6 C) (Oral)  Resp 18  Ht 5\' 2"  (1.575 m)  Wt 140 lb 9.6 oz (63.776 kg)  BMI 25.71 kg/m2  SpO2 96% Physical Exam  Nursing note and vitals reviewed. Constitutional: She is oriented to person, place, and time. She appears well-developed and well-nourished. No distress.  HENT:  Head: Normocephalic and atraumatic.  Eyes: Pupils are equal, round, and reactive to light.  Neck: Normal range of motion.  Cardiovascular: Normal rate, regular rhythm, normal heart sounds and intact distal pulses.   Pulmonary/Chest: Effort normal. No respiratory distress. She has no wheezes. She exhibits no tenderness.  Abdominal: Soft. Bowel sounds are normal. She exhibits no distension. There is no tenderness. There is no rebound and no guarding.  Neurological: She is alert and oriented to person, place, and time. She has normal strength. No cranial nerve deficit or sensory deficit. She exhibits normal  muscle tone. Coordination and gait normal.  Skin: Skin is warm and dry.    ED Course  Procedures (including critical care time) Labs Review Labs Reviewed  COMPREHENSIVE METABOLIC PANEL - Abnormal; Notable for the following:    Glucose, Bld 139 (*)    GFR calc non Af Amer 51 (*)    GFR calc Af Amer 59 (*)    All other components within normal limits  URINALYSIS, ROUTINE W REFLEX MICROSCOPIC - Abnormal; Notable for the following:    Hgb urine dipstick TRACE (*)    Protein, ur 30 (*)    All other components within normal limits  CBC WITH DIFFERENTIAL  URINE MICROSCOPIC-ADD ON   Imaging Review Dg Chest 2 View  06/03/2013   CLINICAL DATA:  Hypertension  EXAM: CHEST  2 VIEW  COMPARISON:  Prior chest x-ray 11/2012  FINDINGS: Stable cardiac and mediastinal contours. Patient is status post median sternotomy with evidence of prior CABG including LIMA bypass. Atherosclerotic calcifications noted throughout the transverse aorta. The lungs remain clear. Mild central bronchitic changes are stable. No acute osseous abnormality. No pleural effusion or pneumothorax.  IMPRESSION: No active cardiopulmonary disease.   Electronically Signed   By: Malachy Moan M.D.   On: 06/03/2013 18:59    EKG Interpretation   None       MDM  Patient is a 79 year old after medic female who comes emergency department today for a asymmetric blood pressures in her arms. Physical exam as above. Review of records demonstrated that patient has had unequal blood pressures in the past. She was evaluated by vascular surgery who felt that she had subclavian steal syndrome. With asymmetric blood pressures in the day her care was discussed with vascular surgery. Since she is asymptomatic vascular surgery recommended she be treated for her blood pressure as she did not require further evaluation at this time. Initial workup included UA, CBC, CMP, chest x-ray. Chest x-ray did not wide mediastinum. With no chest pain, normal chest  x-ray, history of subclavian steal doubt a thoracic dissection. CMP was unremarkable. UA was unremarkable. CBC is unremarkable. Blood pressure was treated with 10 mg of hydralazine emergency department. This decreased her blood pressure down to 150/60. With normal labs doubt hypertensive emergency. Patient was felt to be stable for discharge. She was instructed to followup with her primary care physician in a week for hypertension.  She was instructed to return to the emergency department if she develops  chest pain, weakness, numbness or syncope.  Labs and imaging reviewed by myself and considered and medical decision-making. Imaging was interpreted radiology. Care was discussed with my attending Dr. Rubin Payor.    1. Hypertension   2. Subclavian steal syndrome       Bethann Berkshire, MD 06/04/13 [id number]

## 2013-06-05 NOTE — ED Provider Notes (Signed)
I saw and evaluated the patient, reviewed the resident's note and I agree with the findings and plan.  EKG Interpretation     Ventricular Rate:  52 PR Interval:  164 QRS Duration: 106 QT Interval:  440 QTC Calculation: 409 R Axis:   -33 Text Interpretation:  Sinus bradycardia Possible Left atrial enlargement Left axis deviation Incomplete left bundle branch block Left ventricular hypertrophy with repolarization abnormality Abnormal ECG No significant change since last tracing           Patient with hypertension. History of same. Sent in for differential blood pressures. Has a history of subclavian steal. Discussed with vascular surgery. No further workup needed. Blood pressure improved.  Juliet Rude. Rubin Payor, MD 06/05/13 1504

## 2013-08-19 DIAGNOSIS — R1900 Intra-abdominal and pelvic swelling, mass and lump, unspecified site: Secondary | ICD-10-CM | POA: Diagnosis not present

## 2013-08-19 DIAGNOSIS — I714 Abdominal aortic aneurysm, without rupture, unspecified: Secondary | ICD-10-CM | POA: Diagnosis not present

## 2013-11-30 DIAGNOSIS — E119 Type 2 diabetes mellitus without complications: Secondary | ICD-10-CM | POA: Diagnosis not present

## 2013-11-30 DIAGNOSIS — H40039 Anatomical narrow angle, unspecified eye: Secondary | ICD-10-CM | POA: Diagnosis not present

## 2013-12-09 DIAGNOSIS — E1129 Type 2 diabetes mellitus with other diabetic kidney complication: Secondary | ICD-10-CM | POA: Diagnosis not present

## 2013-12-09 DIAGNOSIS — I10 Essential (primary) hypertension: Secondary | ICD-10-CM | POA: Diagnosis not present

## 2013-12-09 DIAGNOSIS — Z0000 Encounter for general adult medical examination without abnormal findings: Secondary | ICD-10-CM | POA: Diagnosis not present

## 2013-12-09 DIAGNOSIS — N058 Unspecified nephritic syndrome with other morphologic changes: Secondary | ICD-10-CM | POA: Diagnosis not present

## 2014-05-03 DIAGNOSIS — Z23 Encounter for immunization: Secondary | ICD-10-CM | POA: Diagnosis not present

## 2014-08-11 DIAGNOSIS — I6523 Occlusion and stenosis of bilateral carotid arteries: Secondary | ICD-10-CM | POA: Diagnosis not present

## 2014-08-16 DIAGNOSIS — E1121 Type 2 diabetes mellitus with diabetic nephropathy: Secondary | ICD-10-CM | POA: Diagnosis not present

## 2014-08-16 DIAGNOSIS — E785 Hyperlipidemia, unspecified: Secondary | ICD-10-CM | POA: Diagnosis not present

## 2014-08-16 DIAGNOSIS — E1169 Type 2 diabetes mellitus with other specified complication: Secondary | ICD-10-CM | POA: Diagnosis not present

## 2014-08-16 DIAGNOSIS — I10 Essential (primary) hypertension: Secondary | ICD-10-CM | POA: Diagnosis not present

## 2014-09-01 DIAGNOSIS — I6522 Occlusion and stenosis of left carotid artery: Secondary | ICD-10-CM | POA: Diagnosis not present

## 2014-09-01 DIAGNOSIS — I447 Left bundle-branch block, unspecified: Secondary | ICD-10-CM | POA: Diagnosis not present

## 2014-09-01 DIAGNOSIS — I25119 Atherosclerotic heart disease of native coronary artery with unspecified angina pectoris: Secondary | ICD-10-CM | POA: Diagnosis not present

## 2014-09-01 DIAGNOSIS — Z951 Presence of aortocoronary bypass graft: Secondary | ICD-10-CM | POA: Diagnosis not present

## 2014-09-20 DIAGNOSIS — I447 Left bundle-branch block, unspecified: Secondary | ICD-10-CM | POA: Diagnosis not present

## 2014-09-20 DIAGNOSIS — I10 Essential (primary) hypertension: Secondary | ICD-10-CM | POA: Diagnosis not present

## 2014-09-20 DIAGNOSIS — I25119 Atherosclerotic heart disease of native coronary artery with unspecified angina pectoris: Secondary | ICD-10-CM | POA: Diagnosis not present

## 2014-09-22 DIAGNOSIS — I25119 Atherosclerotic heart disease of native coronary artery with unspecified angina pectoris: Secondary | ICD-10-CM | POA: Diagnosis not present

## 2014-09-22 DIAGNOSIS — Z951 Presence of aortocoronary bypass graft: Secondary | ICD-10-CM | POA: Diagnosis not present

## 2014-09-26 MED ORDER — SODIUM CHLORIDE 0.9 % IV SOLN: INTRAVENOUS | Status: DC

## 2014-09-27 ENCOUNTER — Ambulatory Visit (HOSPITAL_COMMUNITY)
Admission: RE | Admit: 2014-09-27 | Discharge: 2014-09-27 | Disposition: A | Discharge status: Home / Self Care | Payer: Medicare Other | Source: Ambulatory Visit | Attending: Cardiology | Admitting: Cardiology

## 2014-09-27 ENCOUNTER — Encounter (HOSPITAL_COMMUNITY): Payer: Self-pay | Admitting: Cardiology

## 2014-09-27 ENCOUNTER — Encounter (HOSPITAL_COMMUNITY)
Admission: RE | Disposition: A | Discharge status: Home / Self Care | Payer: Self-pay | Source: Ambulatory Visit | Attending: Cardiology

## 2014-09-27 DIAGNOSIS — I771 Stricture of artery: Secondary | ICD-10-CM | POA: Diagnosis not present

## 2014-09-27 DIAGNOSIS — I447 Left bundle-branch block, unspecified: Secondary | ICD-10-CM

## 2014-09-27 DIAGNOSIS — E780 Pure hypercholesterolemia: Secondary | ICD-10-CM | POA: Diagnosis not present

## 2014-09-27 DIAGNOSIS — Z951 Presence of aortocoronary bypass graft: Secondary | ICD-10-CM | POA: Insufficient documentation

## 2014-09-27 DIAGNOSIS — Z7982 Long term (current) use of aspirin: Secondary | ICD-10-CM | POA: Insufficient documentation

## 2014-09-27 DIAGNOSIS — E119 Type 2 diabetes mellitus without complications: Secondary | ICD-10-CM | POA: Insufficient documentation

## 2014-09-27 DIAGNOSIS — I10 Essential (primary) hypertension: Secondary | ICD-10-CM | POA: Diagnosis not present

## 2014-09-27 DIAGNOSIS — I2510 Atherosclerotic heart disease of native coronary artery without angina pectoris: Secondary | ICD-10-CM | POA: Diagnosis not present

## 2014-09-27 DIAGNOSIS — I6522 Occlusion and stenosis of left carotid artery: Secondary | ICD-10-CM | POA: Insufficient documentation

## 2014-09-27 DIAGNOSIS — R9431 Abnormal electrocardiogram [ECG] [EKG]: Secondary | ICD-10-CM | POA: Diagnosis not present

## 2014-09-27 HISTORY — PX: LEFT HEART CATHETERIZATION WITH CORONARY/GRAFT ANGIOGRAM: SHX5450

## 2014-09-27 HISTORY — PX: UNILATERAL UPPER EXTREMEITY ANGIOGRAM: SHX5517

## 2014-09-27 LAB — GLUCOSE, CAPILLARY
GLUCOSE-CAPILLARY: 69 mg/dL — AB (ref 70–99)
Glucose-Capillary: 164 mg/dL — ABNORMAL HIGH (ref 70–99)

## 2014-09-27 LAB — POCT I-STAT, CHEM 8
BUN: 16 mg/dL (ref 6–23)
Calcium, Ion: 1.19 mmol/L (ref 1.13–1.30)
Chloride: 105 mmol/L (ref 96–112)
Creatinine, Ser: 1.1 mg/dL (ref 0.50–1.10)
GLUCOSE: 158 mg/dL — AB (ref 70–99)
HCT: 28 % — ABNORMAL LOW (ref 36.0–46.0)
HEMOGLOBIN: 9.5 g/dL — AB (ref 12.0–15.0)
POTASSIUM: 3.3 mmol/L — AB (ref 3.5–5.1)
Sodium: 143 mmol/L (ref 135–145)
TCO2: 21 mmol/L (ref 0–100)

## 2014-09-27 SURGERY — LEFT HEART CATHETERIZATION WITH CORONARY/GRAFT ANGIOGRAM
Anesthesia: LOCAL

## 2014-09-27 MED ORDER — HEPARIN (PORCINE) IN NACL 2-0.9 UNIT/ML-% IJ SOLN
INTRAMUSCULAR | Status: AC
  Filled 2014-09-27: qty 1000

## 2014-09-27 MED ORDER — LIDOCAINE HCL (PF) 1 % IJ SOLN
INTRAMUSCULAR | Status: AC
  Filled 2014-09-27: qty 30

## 2014-09-27 MED ORDER — FENTANYL CITRATE 0.05 MG/ML IJ SOLN
INTRAMUSCULAR | Status: AC
  Filled 2014-09-27: qty 2

## 2014-09-27 MED ORDER — SODIUM CHLORIDE 0.9 % IV BOLUS (SEPSIS)
500.0000 mL | Freq: Once | INTRAVENOUS | Status: AC
  Administered 2014-09-27: 500 mL via INTRAVENOUS

## 2014-09-27 MED ORDER — CHLORTHALIDONE 25 MG PO TABS: 25.0000 mg | ORAL_TABLET | Freq: Every day | ORAL | Status: DC

## 2014-09-27 MED ORDER — METFORMIN HCL 1000 MG PO TABS: 1000.0000 mg | ORAL_TABLET | Freq: Two times a day (BID) | ORAL | Status: DC

## 2014-09-27 MED ORDER — SODIUM CHLORIDE 0.9 % IV SOLN: 1.0000 mL/kg/h | INTRAVENOUS | Status: DC

## 2014-09-27 MED ORDER — NITROGLYCERIN 1 MG/10 ML FOR IR/CATH LAB
INTRA_ARTERIAL | Status: AC
  Filled 2014-09-27: qty 10

## 2014-09-27 MED ORDER — MIDAZOLAM HCL 2 MG/2ML IJ SOLN
INTRAMUSCULAR | Status: AC
  Filled 2014-09-27: qty 2

## 2014-09-27 MED ORDER — LOSARTAN POTASSIUM 100 MG PO TABS: 100.0000 mg | ORAL_TABLET | Freq: Every day | ORAL | Status: DC

## 2014-09-27 NOTE — CV Procedure (Addendum)
Procedure performed: Hemodynamic monitoring of the left ventricle, selective right and left coronary artery gram, saphenous vein graft and left internal mammary arteriogram, left subclavian arteriogram, placement of the catheter in the left subclavian artery for selective arteriogram.  Indication: Patient is a fairly active 81 year old African-American female with history of known CABG in 2003, presents with new onset of left bundle branch block. She also has abnormal echocardiogram which is new with mild to moderate LV systolic dysfunction, has previously known moderate left subclavian artery stenosis. Recent evaluation of her left bundle branch block led to the diagnosis of high-grade left subclavian artery stenosis. Due to LV systolic dysfunction, she was set up for peripheral arteriogram along with coronary arteriogram to evaluate progression of coronary disease and subclavian artery stenosis.  Hemodynamic data: LV pressure 143/1 with end*pressure of 12 mmHg. Aortic pressure was 131/44 with mean of 75 mmHg. There was no pressure gradient across the aortic valve.  Angiographic data: Left subclavian arteriogram: Left subclavian artery in the proximal segment has a high-grade 95% stenosis. A LIMA is evident distal to the stenosis, and LIMA to LAD is patent.  Right coronary artery: Dominant vessel, occluded in the proximal segment. SVG to RCA: Patent with mild disease of the native vessel.  Left main coronary artery large caliber vessel: Gives origin to a moderate size ramus which is mild to moderate diffuse disease distally. A large circumflex carotid artery with the proximal 70-80% stenosis. The circumflex coronary artery gives origin to small to at most moderate sized OM1's and OM 2 and OM 3.  SVG to ramus intermediate/OM1 and OM 3: It is patent with mild to moderate diffuse disease in the midsegment at the skipped segment. Complete visualization was not performed to avoid contrast exposure however  excellent brisk TIMI-3 flow was evident throughout the graft.  The LAD is a moderate to large caliber vessel, it gives origin to several small diagonals, midsegment has a high-grade 99% stenosis. Competitive flow is noted in the distal LAD.  LIMA to LAD: It is widely patent with competitive filling noted in the distal LAD. Proximal left subclavian artery has a high-grade 95% stenosis.  Impression: High-grade left subclavian artery stenosis which is giving origin to LIMA to LAD graft, new onset left bundle branch block, recent progression of subclavian artery stenosis by outpatient duplex.  Recommendation: She will need left subclavian arterial angioplasty, stenting. Due to chronic renal insufficiency, I will set this up in the outpatient basis on another day. A total of 70 mL of contrast was utilized for diagnostic angiography today. Serum creatinine was 1.0 after she had held lisinopril and diuretics for 2 days prior to the procedure. We will bring her in for hydration prior to left subclavian angioplasty.  Technique: Under sterile precautions using a 5 French right femoral arterial access, a 5 [place] JR4 diagnostic catheter was utilized to perform left subclavian arteriogram selectively engaging this. The catheter attempted to engage the vein grafts, however I was unable to engage in the vein grafts. Selective right coronary arteriogram was performed this catheter and the catheter exchanged to a AL-1 which was utilized to engage the left main coronary artery and angiography was performed. Then I utilized a AR-1 to engage the saphenous when graft to right coronary artery and saphenous when graft to ramus and OM. Catheter exchange was done over the J-wire. Patient of the procedure well. There was no immediate competitions.

## 2014-09-27 NOTE — H&P (Signed)
  Please see office visit notes for complete details of HPI.  

## 2014-09-27 NOTE — Interval H&P Note (Signed)
History and Physical Interval Note:  09/27/2014 7:42 AM  [name]  has presented today for surgery, with the diagnosis of subclavian artery stenosis, cad  The various methods of treatment have been discussed with the patient and family. After consideration of risks, benefits and other options for treatment, the patient has consented to  Procedure(s): LEFT HEART CATHETERIZATION WITH CORONARY/GRAFT ANGIOGRAM (N/A) SUBCLAVIAN ARTERIOGRAM  (N/A)  And possible PCI and PTA as a surgical intervention .  The patient's history has been reviewed, patient examined, no change in status, stable for surgery.  I have reviewed the patient's chart and labs.  Questions were answered to the patient's satisfaction.   Cath Lab Visit (complete for each Cath Lab visit)  Clinical Evaluation Leading to the Procedure:   ACS: No.  Non-ACS:    Anginal Classification: CCS I, new LBBB,   Anti-ischemic medical therapy: Maximal Therapy (2 or more classes of medications)  Non-Invasive Test Results: No non-invasive testing performed  Prior CABG: Previous CABG         [facility]GANJI,JAGADEESH R

## 2014-09-27 NOTE — Progress Notes (Signed)
Site area: right  Groin a 5 french arterial sheath was removed  Site Prior to Removal:  Level 0  Pressure Applied For 20 MINUTES    Minutes Beginning at 0850am  Manual:   Yes.    Patient Status During Pull:  stable  Post Pull Groin Site:  Level 0  Post Pull Instructions Given:  Yes.    Post Pull Pulses Present:  Yes.    Dressing Applied:  Yes.    Comments:  VS remain stable during sheath pull.  Pt denies any discomfort at this time.

## 2014-09-27 NOTE — Discharge Instructions (Signed)

## 2014-10-05 DIAGNOSIS — I447 Left bundle-branch block, unspecified: Secondary | ICD-10-CM | POA: Diagnosis not present

## 2014-10-05 DIAGNOSIS — E780 Pure hypercholesterolemia: Secondary | ICD-10-CM | POA: Diagnosis not present

## 2014-10-05 DIAGNOSIS — I771 Stricture of artery: Secondary | ICD-10-CM | POA: Diagnosis not present

## 2014-10-05 DIAGNOSIS — I25119 Atherosclerotic heart disease of native coronary artery with unspecified angina pectoris: Secondary | ICD-10-CM | POA: Diagnosis not present

## 2014-10-18 DIAGNOSIS — I25119 Atherosclerotic heart disease of native coronary artery with unspecified angina pectoris: Secondary | ICD-10-CM | POA: Diagnosis not present

## 2014-10-18 DIAGNOSIS — Z01810 Encounter for preprocedural cardiovascular examination: Secondary | ICD-10-CM | POA: Diagnosis not present

## 2014-10-18 DIAGNOSIS — I447 Left bundle-branch block, unspecified: Secondary | ICD-10-CM | POA: Diagnosis not present

## 2014-10-19 DIAGNOSIS — I10 Essential (primary) hypertension: Secondary | ICD-10-CM | POA: Diagnosis not present

## 2014-10-19 DIAGNOSIS — I25810 Atherosclerosis of coronary artery bypass graft(s) without angina pectoris: Secondary | ICD-10-CM | POA: Diagnosis not present

## 2014-10-19 DIAGNOSIS — E119 Type 2 diabetes mellitus without complications: Secondary | ICD-10-CM | POA: Diagnosis not present

## 2014-10-19 DIAGNOSIS — I771 Stricture of artery: Secondary | ICD-10-CM | POA: Diagnosis not present

## 2014-10-19 DIAGNOSIS — Z1389 Encounter for screening for other disorder: Secondary | ICD-10-CM | POA: Diagnosis not present

## 2014-10-19 DIAGNOSIS — E785 Hyperlipidemia, unspecified: Secondary | ICD-10-CM | POA: Diagnosis not present

## 2014-10-24 ENCOUNTER — Observation Stay (HOSPITAL_COMMUNITY)
Admission: RE | Admit: 2014-10-24 | Discharge: 2014-10-26 | Disposition: A | Discharge status: Home / Self Care | Payer: Medicare Other | Source: Ambulatory Visit | Attending: Cardiology | Admitting: Cardiology

## 2014-10-24 ENCOUNTER — Encounter (HOSPITAL_COMMUNITY): Payer: Self-pay | Admitting: General Practice

## 2014-10-24 DIAGNOSIS — I5189 Other ill-defined heart diseases: Secondary | ICD-10-CM | POA: Diagnosis not present

## 2014-10-24 DIAGNOSIS — N183 Chronic kidney disease, stage 3 (moderate): Secondary | ICD-10-CM | POA: Diagnosis not present

## 2014-10-24 DIAGNOSIS — Z79899 Other long term (current) drug therapy: Secondary | ICD-10-CM | POA: Insufficient documentation

## 2014-10-24 DIAGNOSIS — I70208 Unspecified atherosclerosis of native arteries of extremities, other extremity: Secondary | ICD-10-CM | POA: Diagnosis not present

## 2014-10-24 DIAGNOSIS — I2582 Chronic total occlusion of coronary artery: Secondary | ICD-10-CM | POA: Diagnosis not present

## 2014-10-24 DIAGNOSIS — E119 Type 2 diabetes mellitus without complications: Secondary | ICD-10-CM | POA: Diagnosis not present

## 2014-10-24 DIAGNOSIS — Z90710 Acquired absence of both cervix and uterus: Secondary | ICD-10-CM | POA: Insufficient documentation

## 2014-10-24 DIAGNOSIS — I447 Left bundle-branch block, unspecified: Secondary | ICD-10-CM | POA: Diagnosis not present

## 2014-10-24 DIAGNOSIS — E780 Pure hypercholesterolemia: Secondary | ICD-10-CM | POA: Insufficient documentation

## 2014-10-24 DIAGNOSIS — I739 Peripheral vascular disease, unspecified: Secondary | ICD-10-CM | POA: Diagnosis present

## 2014-10-24 DIAGNOSIS — I129 Hypertensive chronic kidney disease with stage 1 through stage 4 chronic kidney disease, or unspecified chronic kidney disease: Secondary | ICD-10-CM | POA: Insufficient documentation

## 2014-10-24 DIAGNOSIS — Z951 Presence of aortocoronary bypass graft: Secondary | ICD-10-CM | POA: Diagnosis not present

## 2014-10-24 DIAGNOSIS — I771 Stricture of artery: Secondary | ICD-10-CM | POA: Diagnosis present

## 2014-10-24 DIAGNOSIS — I25119 Atherosclerotic heart disease of native coronary artery with unspecified angina pectoris: Secondary | ICD-10-CM | POA: Insufficient documentation

## 2014-10-24 DIAGNOSIS — Z7982 Long term (current) use of aspirin: Secondary | ICD-10-CM | POA: Insufficient documentation

## 2014-10-24 HISTORY — DX: Stricture of artery: I77.1

## 2014-10-24 LAB — GLUCOSE, CAPILLARY
GLUCOSE-CAPILLARY: 69 mg/dL — AB (ref 70–99)
Glucose-Capillary: 212 mg/dL — ABNORMAL HIGH (ref 70–99)
Glucose-Capillary: 96 mg/dL (ref 70–99)
Glucose-Capillary: 182 mg/dL — ABNORMAL HIGH (ref 70–99)

## 2014-10-24 LAB — HCG, SERUM, QUALITATIVE: Preg, Serum: NEGATIVE

## 2014-10-24 MED ORDER — GABAPENTIN 100 MG PO CAPS: 200.0000 mg | ORAL_CAPSULE | Freq: Every day | ORAL | Status: DC

## 2014-10-24 MED ORDER — VITAMIN D3 25 MCG (1000 UNIT) PO TABS
2000.0000 [IU] | ORAL_TABLET | Freq: Every day | ORAL | Status: DC
  Administered 2014-10-24 – 2014-10-25 (×2): 2000 [IU] via ORAL
  Filled 2014-10-24 (×3): qty 2

## 2014-10-24 MED ORDER — INSULIN ASPART 100 UNIT/ML ~~LOC~~ SOLN: 0.0000 [IU] | Freq: Every day | SUBCUTANEOUS | Status: DC

## 2014-10-24 MED ORDER — VITAMIN D3 50 MCG (2000 UT) PO CAPS: 2000.0000 [IU] | ORAL_CAPSULE | Freq: Every day | ORAL | Status: DC

## 2014-10-24 MED ORDER — INSULIN ASPART 100 UNIT/ML ~~LOC~~ SOLN
0.0000 [IU] | Freq: Three times a day (TID) | SUBCUTANEOUS | Status: DC
  Administered 2014-10-24: 5 [IU] via SUBCUTANEOUS
  Administered 2014-10-25: 13:00:00 2 [IU] via SUBCUTANEOUS

## 2014-10-24 MED ORDER — AMLODIPINE BESYLATE 10 MG PO TABS
10.0000 mg | ORAL_TABLET | Freq: Every day | ORAL | Status: DC
  Administered 2014-10-24 – 2014-10-26 (×3): 10 mg via ORAL
  Filled 2014-10-24 (×3): qty 1

## 2014-10-24 MED ORDER — SODIUM CHLORIDE 0.9 % IV SOLN
INTRAVENOUS | Status: DC
  Administered 2014-10-24 – 2014-10-25 (×2): via INTRAVENOUS

## 2014-10-24 MED ORDER — CLOPIDOGREL BISULFATE 75 MG PO TABS
75.0000 mg | ORAL_TABLET | Freq: Every day | ORAL | Status: DC
  Administered 2014-10-25 – 2014-10-26 (×2): 75 mg via ORAL
  Filled 2014-10-24 (×3): qty 1

## 2014-10-24 MED ORDER — PRAVASTATIN SODIUM 40 MG PO TABS
40.0000 mg | ORAL_TABLET | Freq: Every day | ORAL | Status: DC
  Administered 2014-10-25: 40 mg via ORAL
  Filled 2014-10-24 (×3): qty 1

## 2014-10-24 MED ORDER — ASPIRIN EC 81 MG PO TBEC
81.0000 mg | DELAYED_RELEASE_TABLET | Freq: Every day | ORAL | Status: DC
  Administered 2014-10-24 – 2014-10-26 (×3): 81 mg via ORAL
  Filled 2014-10-24 (×3): qty 1

## 2014-10-24 MED ORDER — CLOPIDOGREL BISULFATE 75 MG PO TABS: 75.0000 mg | ORAL_TABLET | Freq: Every day | ORAL | Status: DC

## 2014-10-24 MED ORDER — ACETAMINOPHEN 325 MG PO TABS: 650.0000 mg | ORAL_TABLET | Freq: Every day | ORAL | Status: DC | PRN

## 2014-10-24 MED ORDER — AMLODIPINE BESYLATE 10 MG PO TABS: 10.0000 mg | ORAL_TABLET | Freq: Every day | ORAL | Status: DC

## 2014-10-24 MED ORDER — PRAVASTATIN SODIUM 40 MG PO TABS: 40.0000 mg | ORAL_TABLET | Freq: Every day | ORAL | Status: DC

## 2014-10-24 MED ORDER — ONDANSETRON HCL 4 MG/2ML IJ SOLN
4.0000 mg | Freq: Four times a day (QID) | INTRAMUSCULAR | Status: DC | PRN
  Administered 2014-10-25: 4 mg via INTRAVENOUS
  Filled 2014-10-24: qty 2

## 2014-10-24 MED ORDER — GLIMEPIRIDE 2 MG PO TABS
2.0000 mg | ORAL_TABLET | Freq: Every day | ORAL | Status: DC
  Administered 2014-10-26: 09:00:00 2 mg via ORAL
  Filled 2014-10-24 (×2): qty 1

## 2014-10-24 MED ORDER — ASPIRIN EC 81 MG PO TBEC: 81.0000 mg | DELAYED_RELEASE_TABLET | Freq: Every day | ORAL | Status: DC

## 2014-10-24 MED ORDER — ACETAMINOPHEN 325 MG PO TABS
650.0000 mg | ORAL_TABLET | ORAL | Status: DC | PRN
  Filled 2014-10-24: qty 2

## 2014-10-24 MED ORDER — CARVEDILOL 3.125 MG PO TABS
6.2500 mg | ORAL_TABLET | Freq: Two times a day (BID) | ORAL | Status: DC
  Administered 2014-10-25 – 2014-10-26 (×3): 6.25 mg via ORAL
  Filled 2014-10-24: qty 2
  Filled 2014-10-24: qty 1
  Filled 2014-10-24: qty 2

## 2014-10-24 MED ORDER — GABAPENTIN 100 MG PO CAPS
200.0000 mg | ORAL_CAPSULE | Freq: Every day | ORAL | Status: DC
  Administered 2014-10-24 – 2014-10-25 (×2): 200 mg via ORAL
  Filled 2014-10-24 (×3): qty 2

## 2014-10-24 MED ORDER — INSULIN ASPART 100 UNIT/ML ~~LOC~~ SOLN: 0.0000 [IU] | Freq: Three times a day (TID) | SUBCUTANEOUS | Status: DC

## 2014-10-24 MED ORDER — CARVEDILOL 6.25 MG PO TABS: 6.2500 mg | ORAL_TABLET | Freq: Two times a day (BID) | ORAL | Status: DC

## 2014-10-24 NOTE — Progress Notes (Signed)
Physician notified: Jacinto HalimGanji At: 1228  Regarding: Direct admit here, planned cath 3/22. Placed on HH diet; pt DM on oral antidiabetic meds at home. Not taken this AM, BG 212, need SSI? Thanks Awaiting return response.   Returned Response at: 1232  Order(s): place on regular SSI, hold metformin.

## 2014-10-24 NOTE — H&P (Signed)
[name] is an 81 y.o. female.   Chief Complaint: New onset LBBB. New decrease in LVEF and left subclavian artery stenosis HPI: Patient is a 81 year old African American female fairly active with PMH of CAD S/p CABG in 2003, DM2, and HTN admitted to the hospital on an elective fashion for IV hydration prior to evaluation of PAD, CAD, hypertension and subclavian steal syndrome. [place].   Recently has had abnormal EKG in the form of new onset left bundle branch block and mild LV systolic dysfunction. Due to this, I had set her up for cardiac catheterization and also subclavian artery stenosis evaluation by angiogram on 09/27/2014. She tolerates the procedure well. She has stage III chronic kidney disease almost on the verge of stage IV. She was found to have patent grafts but very high-grade left subclavian artery stenosis. No new symptoms.  She states that presently she does not have any symptoms of any chest pain or weakness in the extremities, denies any dizziness or chest pain. No dyspnea, PND or orthopnea. She has slight LE edema occasionally.  Her diabetes is controlled on metformin. No other specific complaints. She does get tingling and numbness in her feet which are fairly chronic and stable. There is no history to suggest prior TIA. No recent weight changes. No palpitations.  Past Medical History  Diagnosis Date  . Hypertension   . Hypercholesteremia   . Stenosis of subclavian artery   . Diabetes mellitus without complication     type 2    Past Surgical History  Procedure Laterality Date  . Coronary artery bypass graft      [place]  . Abdominal hysterectomy      1970  . Left heart catheterization with coronary/graft angiogram N/A 09/27/2014    Procedure: LEFT HEART CATHETERIZATION WITH Isabel CapriceORONARY/GRAFT ANGIOGRAM;  Surgeon: Pamella PertJagadeesh R Zooey Schreurs, MD;  [facility];  Service: Cardiovascular;  Laterality:  N/A;  . Unilateral upper extremeity angiogram N/A 09/27/2014    Procedure: SUBCLAVIAN ARTERIOGRAM ;  Surgeon: Pamella PertJagadeesh R Zaiyah Sottile, MD;  [facility];  Service: Cardiovascular;  Laterality: N/A;  . Cataract extraction Bilateral     History reviewed. No pertinent family history. Social History:  reports that she has never smoked. She has never used smokeless tobacco. She reports that she does not drink alcohol or use illicit drugs.  Allergies:  Allergies  Allergen Reactions  . Crestor [Rosuvastatin] Other (See Comments)    Myalgias  . Lipitor [Atorvastatin] Other (See Comments)    myalgias  . Zocor [Simvastatin] Other (See Comments)    myalgias    Medications Prior to Admission  Medication Sig Dispense Refill  . amLODipine (NORVASC) 5 MG tablet Take 10 mg by mouth daily.     Marland Kitchen. aspirin EC 81 MG tablet Take 81 mg by mouth daily.    . Calcium Citrate-Vitamin D (CALCIUM CITRATE + PO) Take 4 capsules by mouth daily.    . carvedilol (COREG) 6.25 MG tablet Take 6.25 mg by mouth 2 (two) times daily with a meal.    . chlorthalidone (HYGROTON) 25 MG tablet Take 1 tablet (25 mg total) by mouth daily.    . Cholecalciferol (VITAMIN D3) 2000 UNITS capsule Take 2,000 Units by mouth daily.    . clopidogrel (PLAVIX) 75 MG tablet Take 1 tablet (75 mg total) by mouth daily with breakfast. 30 tablet 0  . gabapentin (NEURONTIN) 100 MG capsule Take 200 mg by  mouth at bedtime.    Marland Kitchen glimepiride (AMARYL) 2 MG tablet Take 2 mg by mouth daily with breakfast.    . losartan (COZAAR) 100 MG tablet Take 1 tablet (100 mg total) by mouth daily.    Marland Kitchen lovastatin (MEVACOR) 40 MG tablet Take 40 mg by mouth at bedtime.    . metFORMIN (GLUCOPHAGE) 1000 MG tablet Take 1 tablet (1,000 mg total) by mouth 2 (two) times daily with a meal.    . acetaminophen (TYLENOL) 325 MG tablet Take 650 mg by mouth daily as needed (pain).        Review of Systems - General:Not Present- Anorexia, Fatigue and Fever. Skin:Not  Present- Itching and Rash. HEENT:Not Present- Headache. Respiratory:Not Present- Cough, Decreased Exercise Tolerance and Dyspnea. Cardiovascular:Not Present- Chest Pain, Edema, Orthopnea, Paroxysmal Nocturnal Dyspnea and Shortness of Breath. Gastrointestinal:Not Present- Change in Bowel Habits, Constipation and Nausea. Musculoskeletal:Not Present- Joint Swelling. Neurological:Not Present- Focal Neurological Symptoms. Endocrine:Not Present- Appetite Changes, Cold Intolerance and Heat Intolerance. Hematology:Not Present- Anemia, Petechiae and Prolonged Bleeding.  Blood pressure 140/47, pulse 66, temperature 98.2 F (36.8 C), temperature source Oral, resp. rate 18, height  (1.549 m), weight 59 kg (130 lb 1.1 oz), SpO2 97 %.    General: Moderately built and normal body habitus who is in no acute distress. Appears younger than stated age. Alert Ox3.   There is no cyanosis. HEENT: normal limits. No JVD.   CARDIAC EXAM: S1, S2 widely split with respiratory variation, no gallop present. No murmur.   CHEST EXAM: No tenderness of chest wall. LUNGS: Clear to percuss and auscultate.  ABDOMEN: No hepatosplenomegaly. BS normal in all 4 quadrants. Abdomen is non-tender.   EXTREMITY: Full range of movementes, No edema. MUSCULOSKELETAL EXAM: Intact with full range of motion in all 4 extremities.   NEUROLOGIC EXAM: Grossly intact without any focal deficits. Alert O x 3.   VASCULAR EXAM: No skin breakdown. Carotids prominent left carotid and soft right carotid bruit. Lef supraclavicular (subclavian) bruit. Left brachial and radial pulse barely palpable and feeble. RIght upper extremity pulse normal. Extremities: Femoral pulse normal. Popliteal pulse normal ; Pedal pulse normal. Prominent abdominal bruit present. No varicose veins.  Results for orders placed or performed during the hospital encounter of 10/24/14 (from the past 48 hour(s))  Glucose, capillary     Status: Abnormal    Collection Time: 10/24/14 12:03 PM  Result Value Ref Range   Glucose-Capillary 212 (H) 70 - 99 mg/dL  hCG, serum, qualitative     Status: None   Collection Time: 10/24/14 12:47 PM  Result Value Ref Range   Preg, Serum NEGATIVE NEGATIVE    Comment:        THE SENSITIVITY OF THIS METHODOLOGY IS >10 mIU/mL.   Glucose, capillary     Status: Abnormal   Collection Time: 10/24/14  5:18 PM  Result Value Ref Range   Glucose-Capillary 69 (L) 70 - 99 mg/dL  Glucose, capillary     Status: None   Collection Time: 10/24/14  5:49 PM  Result Value Ref Range   Glucose-Capillary 96 70 - 99 mg/dL   No results found.  Labs:   Lab Results  Component Value Date   WBC 9.5 06/03/2013   HGB 9.5* 09/27/2014   HCT 28.0* 09/27/2014   MCV 83.5 06/03/2013   PLT 207 06/03/2013   No results for input(s): NA, K, CL, CO2, BUN, CREATININE, CALCIUM, PROT, BILITOT, ALKPHOS, ALT, AST, GLUCOSE in the last 168 hours.  Invalid input(s): LABALBU  Lab Results  Component Value Date   TROPONINI <0.30 03/09/2013    Lipid Panel     Component Value Date/Time   CHOL 193 03/09/2013 0500   TRIG 78 03/09/2013 0500   HDL 57 03/09/2013 0500   CHOLHDL 3.4 03/09/2013 0500   VLDL 16 03/09/2013 0500   LDLCALC 120* 03/09/2013 0500    EKG 09/01/2014: Normal sinus rhythm at rate of 58 bpm, left atrial enlargement, left bundle branch block. No further analysis due to left bundle branch block.  EKG 07/16/2013: Normal sinus rhythm at rate of 58 bpm, left atrial abnormality, poor R-wave progression, cannot exclude anterior infarct, old diffuse nonspecific T-wave abnormality.  Assessment/Plan 1. Subclavian artery stenosis, left, symptomatic. Coronary and subclavian arteriogram on 09/27/2014: Patent LIMA to LAD, SVG to OM1 and OM 3 and SVG to RCA. Mid LAD with a high-grade 99% stenosis but protected by LIMA to LAD. Left subclavian artery 95% stenosis.  Upper extremity arterial duplex 06/28/2013: 1. Monophasic waveform and  dampened waveform throughout the left upper extremity suggest proximal left subclavian artery stenosis, however antegrade flow is evident in the left vertebral artery, and hence may not be of hemodynamic significance. Blood pressure right arm was 220 mmHg systolic and left arm was 135 mmHg systolic. 2. Less than 50% stenosis in the right subclavian artery with antegrade flow evident in the right vertebral artery suggests no hemodynamic significance. 3. Clinical correlation is recommended. If patient has significant left arm claudication, consider further workup including angiography.  2. Atherosclerosis of native coronary artery of native heart with angina pectoris  CABG 06/04/2002 with LIMA to LAD and SVG to ramus intermediate and OM1 and OM 2 in a sequential fashion, SVG to distal RCA. Performed at [facility] in [place], [place].  Coronary and subclavian arteriogram on 09/27/2014: Patent LIMA to LAD, SVG to OM1 and OM 3 and SVG to RCA. Mid LAD with a high-grade 99% stenosis but protected by LIMA to LAD. Left subclavian artery 95% stenosis.  3. LBBB (left bundle branch block)   4. Hyperlipidemia, group A   Rec: Patient admitted for elective PTA of the left subclavian artery and pre procedure hydration. Her recent onset of left bundle branch block and LV dysfunction suggest that this may be related to symptomatic left subclavian stenosis. Patient also has experienced several episodes of sudden onset of dizziness and also gait imbalance that she states happens sporadically. Due to symptomatic left subclavian artery stenosis and to protect LIMA to LAD, I have recommended that we proceed with angioplasty of the same. Patient understands less than 1% risk of death, stroke, 2-3% risk of renal failure, bleeding and infection but not limited to these.   Pamella Pert, MD 10/24/2014, 10:50 PM [facility] Pager: [phone] Office: [phone] If no answer:  Cell:  [phone]

## 2014-10-25 ENCOUNTER — Encounter (HOSPITAL_COMMUNITY): Payer: Self-pay | Admitting: Cardiology

## 2014-10-25 ENCOUNTER — Encounter (HOSPITAL_COMMUNITY)
Admission: RE | Disposition: A | Discharge status: Home / Self Care | Payer: Self-pay | Source: Ambulatory Visit | Attending: Cardiology

## 2014-10-25 DIAGNOSIS — I447 Left bundle-branch block, unspecified: Secondary | ICD-10-CM | POA: Diagnosis not present

## 2014-10-25 DIAGNOSIS — I25119 Atherosclerotic heart disease of native coronary artery with unspecified angina pectoris: Secondary | ICD-10-CM | POA: Diagnosis not present

## 2014-10-25 DIAGNOSIS — I70208 Unspecified atherosclerosis of native arteries of extremities, other extremity: Secondary | ICD-10-CM | POA: Diagnosis not present

## 2014-10-25 DIAGNOSIS — I2582 Chronic total occlusion of coronary artery: Secondary | ICD-10-CM | POA: Diagnosis not present

## 2014-10-25 DIAGNOSIS — I129 Hypertensive chronic kidney disease with stage 1 through stage 4 chronic kidney disease, or unspecified chronic kidney disease: Secondary | ICD-10-CM | POA: Diagnosis not present

## 2014-10-25 DIAGNOSIS — N183 Chronic kidney disease, stage 3 (moderate): Secondary | ICD-10-CM | POA: Diagnosis not present

## 2014-10-25 DIAGNOSIS — I771 Stricture of artery: Secondary | ICD-10-CM | POA: Diagnosis not present

## 2014-10-25 HISTORY — PX: VISCERAL ANGIOGRAM: SHX5515

## 2014-10-25 LAB — BASIC METABOLIC PANEL
Anion gap: 6 (ref 5–15)
BUN: 21 mg/dL (ref 6–23)
CHLORIDE: 110 mmol/L (ref 96–112)
CO2: 27 mmol/L (ref 19–32)
Calcium: 8.8 mg/dL (ref 8.4–10.5)
Creatinine, Ser: 1.34 mg/dL — ABNORMAL HIGH (ref 0.50–1.10)
GFR calc Af Amer: 40 mL/min — ABNORMAL LOW (ref >90)
GFR calc non Af Amer: 35 mL/min — ABNORMAL LOW (ref >90)
GLUCOSE: 107 mg/dL — AB (ref 70–99)
POTASSIUM: 3.8 mmol/L (ref 3.5–5.1)
SODIUM: 143 mmol/L (ref 135–145)

## 2014-10-25 LAB — GLUCOSE, CAPILLARY
GLUCOSE-CAPILLARY: 133 mg/dL — AB (ref 70–99)
GLUCOSE-CAPILLARY: 187 mg/dL — AB (ref 70–99)
Glucose-Capillary: 110 mg/dL — ABNORMAL HIGH (ref 70–99)
Glucose-Capillary: 78 mg/dL (ref 70–99)

## 2014-10-25 LAB — POCT ACTIVATED CLOTTING TIME
ACTIVATED CLOTTING TIME: 159 s
Activated Clotting Time: 220 seconds

## 2014-10-25 SURGERY — VISCERAL ANGIOGRAM
Anesthesia: LOCAL

## 2014-10-25 MED ORDER — FENTANYL CITRATE 0.05 MG/ML IJ SOLN
INTRAMUSCULAR | Status: AC
  Filled 2014-10-25: qty 2

## 2014-10-25 MED ORDER — SODIUM CHLORIDE 0.9 % IV SOLN
1.0000 mL/kg/h | INTRAVENOUS | Status: AC
  Administered 2014-10-25: 13:00:00 1 mL/kg/h via INTRAVENOUS

## 2014-10-25 MED ORDER — HEPARIN (PORCINE) IN NACL 2-0.9 UNIT/ML-% IJ SOLN
INTRAMUSCULAR | Status: AC
  Filled 2014-10-25: qty 500

## 2014-10-25 MED ORDER — SODIUM CHLORIDE 0.9 % IV SOLN: 250.0000 mL | INTRAVENOUS | Status: DC | PRN

## 2014-10-25 MED ORDER — SODIUM CHLORIDE 0.9 % IJ SOLN: 3.0000 mL | Freq: Two times a day (BID) | INTRAMUSCULAR | Status: DC

## 2014-10-25 MED ORDER — SODIUM CHLORIDE 0.9 % IJ SOLN
3.0000 mL | INTRAMUSCULAR | Status: DC | PRN
Start: 2014-10-25 — End: 2014-10-25

## 2014-10-25 MED ORDER — HEPARIN SODIUM (PORCINE) 1000 UNIT/ML IJ SOLN
INTRAMUSCULAR | Status: AC
Start: 2014-10-25 — End: 2014-10-25
  Filled 2014-10-25: qty 1

## 2014-10-25 MED ORDER — LIDOCAINE HCL (PF) 1 % IJ SOLN
INTRAMUSCULAR | Status: AC
  Filled 2014-10-25: qty 30

## 2014-10-25 MED ORDER — MORPHINE SULFATE 2 MG/ML IJ SOLN
2.0000 mg | Freq: Once | INTRAMUSCULAR | Status: AC
  Administered 2014-10-25: 2 mg via INTRAVENOUS
  Filled 2014-10-25: qty 1

## 2014-10-25 MED ORDER — MIDAZOLAM HCL 2 MG/2ML IJ SOLN
INTRAMUSCULAR | Status: AC
  Filled 2014-10-25: qty 2

## 2014-10-25 NOTE — Progress Notes (Signed)
UR completed 

## 2014-10-25 NOTE — Progress Notes (Signed)
Site area: right groin  Site Prior to Removal:  Level 0  Pressure Applied For 20 MINUTES    Minutes Beginning at 1720  Manual:   Yes.    Patient Status During Pull:  stable  Post Pull Groin Site:  Level 0  Post Pull Instructions Given:  Yes.    Post Pull Pulses Present:  Yes.    Dressing Applied:  Yes.    Comments:  Mild persistent ooze prior to pull, mild vagal corrected with Zofran and 100 cc NS bolus

## 2014-10-25 NOTE — Progress Notes (Signed)
Hypoglycemic Event  CBG: 69  Treatment: 15 GM carbohydrate snack/dinner  Symptoms: None  Follow-up CBG: Time:1749 CBG Result:96  Possible Reasons for Event: Unknown/recieved sliding scale insulin at lunch time  Comments/MD notified:    Colman Caterarpley, Jahrel Borthwick Danielle  Remember to initiate Hypoglycemia Order Set & complete

## 2014-10-25 NOTE — CV Procedure (Signed)
Procedure performed: Left subclavian arteriogram, attempted angioplasty of the left subclavian artery.  Indication: Patient is a fairly active 81 year old Afro-American female with history of known CABG in the remote past, she had new onset left bundle branch block and decreased LVEF. She was also found to have high-grade left subdural artery stenosis, LAD is occluded in the proximal segment and distal segment is supplied by LIMA to LAD. She is also having symptoms of subclavian steal syndrome with dizziness with activity. She had undergone coronary and also peripheral arteriogram on 09/27/2014 and was found to have a high-grade 95-99% stenosis of the left subclavian artery. She's now brought for elective angioplasty of the same.  Angiographic data: Left subclavian artery reveals a high-grade subtotal occlusion, stating close to 95% or so in its proximal segment.  Interventional data: On successful attempt at angioplasty of the left subclavian artery. I was unable to cross the lesion in spite of multiple wire use and also use of multiple catheters including CXI angled support catheter.  Recommendation: Given her elderly status, chronic renal failure, stage III chronic kidney disease, I would recommend that the postpone the procedure, hydrate her and discharge her tomorrow and plan on performing elective and plasty via left brachial artery access. This will be explained to the patient.  Technique: Under sterile precautions using a 7 [place] long shuttle sheath access into the right femoral artery, advanced digital sheath over a JB-1,  6 French catheter and I was able to easily access the left subclavian artery origin. Then I attempted to cross the stenosis with a 0.014 x 290 cm [place] core guidewire. Due to inability to cross the stenosis, I then exchanged to a 0.25" x to 290 cm Glidewire and again attempted to cross the stenosis. I was unable to cross the stenosis, I then attempted to redirect the wire by  utilizing Headhunter, eventually Bernstein  5 French catheters. Using [facility] diagnostic catheter, iron already pulled out the long shuttle sheath out of the body and exchanged to a short 7 French sheath to improve when ablation of the diagnostic catheters. A CXI 0.014 compatible  catheter for direction, I was unable to cross the stenosis. At this point I abandoned the procedure. A total of 100 mL of contrast was utilized for the procedure. Patient tolerated the procedure well. The 7 French sheath was exchanged to a short 8 [place] sheath due to peri-sheath oozing of blood. Sheath was sutured in place.

## 2014-10-26 DIAGNOSIS — N183 Chronic kidney disease, stage 3 (moderate): Secondary | ICD-10-CM | POA: Diagnosis not present

## 2014-10-26 DIAGNOSIS — I447 Left bundle-branch block, unspecified: Secondary | ICD-10-CM | POA: Diagnosis not present

## 2014-10-26 DIAGNOSIS — I129 Hypertensive chronic kidney disease with stage 1 through stage 4 chronic kidney disease, or unspecified chronic kidney disease: Secondary | ICD-10-CM | POA: Diagnosis not present

## 2014-10-26 DIAGNOSIS — I2582 Chronic total occlusion of coronary artery: Secondary | ICD-10-CM | POA: Diagnosis not present

## 2014-10-26 DIAGNOSIS — I25119 Atherosclerotic heart disease of native coronary artery with unspecified angina pectoris: Secondary | ICD-10-CM | POA: Diagnosis not present

## 2014-10-26 DIAGNOSIS — I70208 Unspecified atherosclerosis of native arteries of extremities, other extremity: Secondary | ICD-10-CM | POA: Diagnosis not present

## 2014-10-26 LAB — GLUCOSE, CAPILLARY: Glucose-Capillary: 108 mg/dL — ABNORMAL HIGH (ref 70–99)

## 2014-10-26 LAB — BASIC METABOLIC PANEL
ANION GAP: 3 — AB (ref 5–15)
BUN: 17 mg/dL (ref 6–23)
CALCIUM: 8.4 mg/dL (ref 8.4–10.5)
CO2: 30 mmol/L (ref 19–32)
CREATININE: 1.32 mg/dL — AB (ref 0.50–1.10)
Chloride: 109 mmol/L (ref 96–112)
GFR calc Af Amer: 41 mL/min — ABNORMAL LOW (ref >90)
GFR, EST NON AFRICAN AMERICAN: 35 mL/min — AB (ref >90)
Glucose, Bld: 119 mg/dL — ABNORMAL HIGH (ref 70–99)
Potassium: 3.8 mmol/L (ref 3.5–5.1)
SODIUM: 142 mmol/L (ref 135–145)

## 2014-10-26 LAB — POCT ACTIVATED CLOTTING TIME
Activated Clotting Time: 251 seconds
Activated Clotting Time: 319 seconds
Activated Clotting Time: 331 seconds

## 2014-10-26 MED ORDER — METFORMIN HCL 1000 MG PO TABS: 1000.0000 mg | ORAL_TABLET | Freq: Two times a day (BID) | ORAL | Status: DC

## 2014-10-26 NOTE — Discharge Summary (Signed)
Physician Discharge Summary  Patient ID: [place] M Lebo MRN: [id number] DOB/AGE: 79-May-1927 79 y.o.  Admit date: 10/24/2014 Discharge date: 10/26/2014  Primary Discharge Diagnosis 1. Subclavian artery stenosis, left, symptomatic.  Secondary Discharge Diagnosis 2. Atherosclerosis of native coronary artery of native heart with angina pectoris  CABG 06/04/2002 with LIMA to LAD and SVG to ramus intermediate and OM1 and OM 2 in a sequential fashion, SVG to distal RCA. Performed at [facility] in [place], [place].  Coronary and subclavian arteriogram on 09/27/2014: Patent LIMA to LAD, SVG to OM1 and OM 3 and SVG to RCA. Mid LAD with a high-grade 99% stenosis but protected by LIMA to LAD. Left subclavian artery 95% to 99% stenosis.  3. LBBB (left bundle branch block)   4. Hyperlipidemia, group A   Significant Diagnostic Studies: Left subclavian arteriogram on 10/25/2014, unsuccessful attempt at angioplasty via right femoral arterial access approach.  Hospital Course: Patient is a 81 year old African American female fairly active with PMH of CAD S/p CABG in 2003, DM2, and HTN admitted to the hospital on an elective fashion for IV hydration prior to evaluation of PAD, CAD, hypertension and subclavian steal syndrome. Her CABG was done in [place], [place], [place].   Recently has had abnormal EKG in the form of new onset left bundle branch block and mild to moderate new LV systolic dysfunction. Due to this, I had set her up for cardiac catheterization and also subclavian artery stenosis evaluation by angiogram on 09/27/2014 as there was BP differential and also Doppler suggestion of left SCA stenosis. She was again readmitted to the hospital on 10/24/2014 for hydration followed by possible angioplasty the following day.  Angioplasty was unsuccessful in spite of attempt at trying to cross a very high-grade 95-99% stenosis. Due to stage III chronic kidney disease, advanced age, I did  not attempt accessing the brachial artery in the same setting.  She was kept in the hospital overnight to follow up on serum creatinine which remained stable and felt patient stable for discharge. I will continue to hold losartan on discharge for now. She will resume metformin 2 days after discharge. Patient understands that she has a high-grade stenosis of the subclavian artery and she needs a repeat procedure via brachial artery access or dual access via brachial and femoral    Recommendations on discharge: She'll be scheduled for outpatient left subclavian artery angioplasty via left brachial artery access, probably may need right femoral artery axis also. She'll again be admitted the previous evening for hydration. We will continue to hold losartan for now.   Discharge Exam: Blood pressure 159/37, pulse 66, temperature 98.1 F (36.7 C), temperature source Oral, resp. rate 18, height  (1.549 m), weight 59.7 kg (131 lb 9.8 oz), SpO2 96 %.   General: Moderately built and normal body habitus, petite  who is in no acute distress. Appears younger than stated age. Alert Ox3.   There is no cyanosis. HEENT: normal limits. No JVD.   CARDIAC EXAM: S1, S2 widely split with respiratory variation, no gallop present. No murmur.   CHEST EXAM: No tenderness of chest wall. LUNGS: Clear to percuss and auscultate.  ABDOMEN: No hepatosplenomegaly. BS normal in all 4 quadrants. Abdomen is non-tender.   EXTREMITY: Full range of movementes, No edema. MUSCULOSKELETAL EXAM: Intact with full range of motion in all 4 extremities. Right femoral access without hematoma or pulsatile mass.  NEUROLOGIC EXAM: Grossly intact without any focal deficits. Alert O x 3.    Labs:  Lab Results  Component Value Date   WBC 9.5 06/03/2013   HGB 9.5* 09/27/2014   HCT 28.0* 09/27/2014   MCV 83.5 06/03/2013   PLT 207 06/03/2013    Recent Labs Lab 10/26/14 0500  NA 142  K 3.8  CL 109  CO2 30  BUN 17   CREATININE 1.32*  CALCIUM 8.4  GLUCOSE 119*   Lab Results  Component Value Date   TROPONINI <0.30 03/09/2013    Lipid Panel     Component Value Date/Time   CHOL 193 03/09/2013 0500   TRIG 78 03/09/2013 0500   HDL 57 03/09/2013 0500   CHOLHDL 3.4 03/09/2013 0500   VLDL 16 03/09/2013 0500   LDLCALC 120* 03/09/2013 0500   EKG 09/01/2014: Normal sinus rhythm at rate of 58 bpm, left atrial enlargement, left bundle branch block. No further analysis due to left bundle branch block.  EKG 07/16/2013: Normal sinus rhythm at rate of 58 bpm, left atrial abnormality, poor R-wave progression, cannot exclude anterior infarct, old diffuse nonspecific T-wave abnormality   Radiology: No results found.    FOLLOW UP PLANS AND APPOINTMENTS Discharge Instructions    Discharge patient    Complete by:  As directed             Medication List    STOP taking these medications        losartan 100 MG tablet  Commonly known as:  COZAAR      TAKE these medications        acetaminophen 325 MG tablet  Commonly known as:  TYLENOL  Take 650 mg by mouth daily as needed (pain).     amLODipine 5 MG tablet  Commonly known as:  NORVASC  Take 10 mg by mouth daily.     aspirin EC 81 MG tablet  Take 81 mg by mouth daily.     CALCIUM CITRATE + PO  Take 4 capsules by mouth daily.     carvedilol 6.25 MG tablet  Commonly known as:  COREG  Take 6.25 mg by mouth 2 (two) times daily with a meal.     chlorthalidone 25 MG tablet  Commonly known as:  HYGROTON  Take 1 tablet (25 mg total) by mouth daily.     clopidogrel 75 MG tablet  Commonly known as:  PLAVIX  Take 1 tablet (75 mg total) by mouth daily with breakfast.     gabapentin 100 MG capsule  Commonly known as:  NEURONTIN  Take 200 mg by mouth at bedtime.     glimepiride 2 MG tablet  Commonly known as:  AMARYL  Take 2 mg by mouth daily with breakfast.     lovastatin 40 MG tablet  Commonly known as:  MEVACOR  Take 40 mg by mouth at  bedtime.     metFORMIN 1000 MG tablet  Commonly known as:  GLUCOPHAGE  Take 1 tablet (1,000 mg total) by mouth 2 (two) times daily with a meal.  Start taking on:  10/28/2014     Vitamin D3 2000 UNITS capsule  Take 2,000 Units by mouth daily.           Follow-up Information    Follow up with Pamella PertGANJI,JAGADEESH R, MD On 11/07/2014.   Specialty:  Cardiology   Why:  at 11:45   Contact information:   [address] [place] [address] [phone]       Pamella PertGANJI,JAGADEESH R  Office: [phone]

## 2014-11-07 DIAGNOSIS — I771 Stricture of artery: Secondary | ICD-10-CM | POA: Diagnosis not present

## 2014-11-07 DIAGNOSIS — N183 Chronic kidney disease, stage 3 (moderate): Secondary | ICD-10-CM | POA: Diagnosis not present

## 2014-11-07 DIAGNOSIS — E1122 Type 2 diabetes mellitus with diabetic chronic kidney disease: Secondary | ICD-10-CM | POA: Diagnosis not present

## 2014-11-07 DIAGNOSIS — I25119 Atherosclerotic heart disease of native coronary artery with unspecified angina pectoris: Secondary | ICD-10-CM | POA: Diagnosis not present

## 2014-11-08 ENCOUNTER — Observation Stay (HOSPITAL_COMMUNITY)
Admission: AD | Admit: 2014-11-08 | Discharge: 2014-11-10 | Disposition: A | Discharge status: Home / Self Care | Payer: Medicare Other | Source: Ambulatory Visit | Attending: Cardiology | Admitting: Cardiology

## 2014-11-08 ENCOUNTER — Encounter (HOSPITAL_COMMUNITY): Payer: Self-pay | Admitting: General Practice

## 2014-11-08 DIAGNOSIS — N183 Chronic kidney disease, stage 3 (moderate): Secondary | ICD-10-CM | POA: Diagnosis not present

## 2014-11-08 DIAGNOSIS — I739 Peripheral vascular disease, unspecified: Secondary | ICD-10-CM | POA: Diagnosis present

## 2014-11-08 DIAGNOSIS — E119 Type 2 diabetes mellitus without complications: Secondary | ICD-10-CM | POA: Insufficient documentation

## 2014-11-08 DIAGNOSIS — E780 Pure hypercholesterolemia: Secondary | ICD-10-CM | POA: Diagnosis not present

## 2014-11-08 DIAGNOSIS — M1990 Unspecified osteoarthritis, unspecified site: Secondary | ICD-10-CM | POA: Insufficient documentation

## 2014-11-08 DIAGNOSIS — I70208 Unspecified atherosclerosis of native arteries of extremities, other extremity: Principal | ICD-10-CM | POA: Insufficient documentation

## 2014-11-08 DIAGNOSIS — I129 Hypertensive chronic kidney disease with stage 1 through stage 4 chronic kidney disease, or unspecified chronic kidney disease: Secondary | ICD-10-CM | POA: Insufficient documentation

## 2014-11-08 DIAGNOSIS — I25119 Atherosclerotic heart disease of native coronary artery with unspecified angina pectoris: Secondary | ICD-10-CM | POA: Diagnosis not present

## 2014-11-08 DIAGNOSIS — Z7982 Long term (current) use of aspirin: Secondary | ICD-10-CM | POA: Insufficient documentation

## 2014-11-08 DIAGNOSIS — I447 Left bundle-branch block, unspecified: Secondary | ICD-10-CM | POA: Insufficient documentation

## 2014-11-08 DIAGNOSIS — Z79899 Other long term (current) drug therapy: Secondary | ICD-10-CM | POA: Diagnosis not present

## 2014-11-08 DIAGNOSIS — Z90710 Acquired absence of both cervix and uterus: Secondary | ICD-10-CM | POA: Diagnosis not present

## 2014-11-08 DIAGNOSIS — Z7902 Long term (current) use of antithrombotics/antiplatelets: Secondary | ICD-10-CM | POA: Insufficient documentation

## 2014-11-08 DIAGNOSIS — Z951 Presence of aortocoronary bypass graft: Secondary | ICD-10-CM | POA: Diagnosis not present

## 2014-11-08 DIAGNOSIS — I252 Old myocardial infarction: Secondary | ICD-10-CM | POA: Diagnosis not present

## 2014-11-08 HISTORY — DX: Type 2 diabetes mellitus without complications: E11.9

## 2014-11-08 HISTORY — DX: Acute myocardial infarction, unspecified: I21.9

## 2014-11-08 HISTORY — DX: Unspecified osteoarthritis, unspecified site: M19.90

## 2014-11-08 LAB — BASIC METABOLIC PANEL
Anion gap: 12 (ref 5–15)
BUN: 21 mg/dL (ref 6–23)
CHLORIDE: 108 mmol/L (ref 96–112)
CO2: 22 mmol/L (ref 19–32)
Calcium: 9.1 mg/dL (ref 8.4–10.5)
Creatinine, Ser: 1.5 mg/dL — ABNORMAL HIGH (ref 0.50–1.10)
GFR calc Af Amer: 35 mL/min — ABNORMAL LOW (ref >90)
GFR, EST NON AFRICAN AMERICAN: 30 mL/min — AB (ref >90)
Glucose, Bld: 110 mg/dL — ABNORMAL HIGH (ref 70–99)
Potassium: 4 mmol/L (ref 3.5–5.1)
Sodium: 142 mmol/L (ref 135–145)

## 2014-11-08 MED ORDER — SODIUM CHLORIDE 0.9 % IV SOLN
INTRAVENOUS | Status: DC
  Administered 2014-11-08 – 2014-11-09 (×2): via INTRAVENOUS

## 2014-11-08 MED ORDER — SODIUM BICARBONATE 8.4 % IV SOLN
INTRAVENOUS | Status: DC
  Filled 2014-11-08 (×2): qty 500

## 2014-11-08 MED ORDER — SODIUM BICARBONATE BOLUS VIA INFUSION
INTRAVENOUS | Status: DC
  Filled 2014-11-08: qty 1

## 2014-11-08 NOTE — Progress Notes (Signed)
MEDICATION RELATED CONSULT NOTE - INITIAL   Pharmacy Consult for Sodium Bicarbonate Indication: CIN  Allergies  Allergen Reactions  . Crestor [Rosuvastatin] Other (See Comments)    Myalgias  . Lipitor [Atorvastatin] Other (See Comments)    myalgias  . Zocor [Simvastatin] Other (See Comments)    myalgias    Patient Measurements: Height: 5\' 2"  (157.5 cm) Weight: 132 lb 14.4 oz (60.283 kg) IBW/kg (Calculated) : 50.1  Vital Signs: Temp: 98 F (36.7 C) (04/05 1445) Temp Source: Oral (04/05 1445) BP: 145/45 mmHg (04/05 1445) Pulse Rate: 65 (04/05 1445) Intake/Output from previous day:   Intake/Output from this shift:    Labs: No results for input(s): WBC, HGB, HCT, PLT, APTT, CREATININE, LABCREA, CREATININE, CREAT24HRUR, MG, PHOS, ALBUMIN, PROT, ALBUMIN, AST, ALT, ALKPHOS, BILITOT, BILIDIR, IBILI in the last 72 hours. Estimated Creatinine Clearance: 25.7 mL/min (by C-G formula based on Cr of 1.32).   Microbiology: No results found for this or any previous visit (from the past 720 hour(s)).  Medical History: Past Medical History  Diagnosis Date  . Hypertension   . Hypercholesteremia   . Stenosis of subclavian artery   . Type II diabetes mellitus   . Myocardial infarction 2003  . Arthritis     "qwhere"    Assessment: [age] YOF with hx CKD III admitted for planned angiogram on 4/6 with Dr. Jacinto HalimGanji in the setting of known artery stenosis. The patient was admitted early for hydration and pharmacy was consulted to start a sodium bicarbonate infusion to help prevent contrast-induced nephropathy.   IVF have already been ordered by the physician with NS at a rate of 75 ml/hr. Per the order set - will start a sodium bicarbonate infusion at 180 ml/hr 1 hour prior to the start of the procedure. After the first hour, will reduce the rate to 60 ml/hr and continue this for 2 hours following the procedure. The procedure is scheduled for 1000 currently.   Goal of Therapy:  Prevention of  contrast induced nephropathy  Plan:  1. Start sodium bicarbonate at a rate of 180 ml/hr 1 hour prior to the procedure on 4/6 (~0900) 2. After the first hour, reduce the rate to 60 ml/hr during the procedure and for 2 hours following the procedure.  3. Will monitor the patient's labs and ensure the drip is stopped at the appropriate time.   Georgina PillionElizabeth Shron Ozer, PharmD, BCPS Clinical Pharmacist Pager: [phone] 11/08/2014 6:24 PM

## 2014-11-08 NOTE — H&P (Addendum)
Sonya Chavez is an 81 y.o. female.   Chief Complaint: New onset LBBB. New decrease in LVEF and left subclavian artery stenosis HPI: Patient is a 81 year old African American female fairly active with PMH of CAD S/p CABG in 2003, DM2, and HTN admitted to the hospital on an elective fashion for IV hydration prior to evaluation of PAD, CAD, hypertension and subclavian steal syndrome. Her CABG was done in [place], [place], [place].   Recently has had abnormal EKG in the form of new onset left bundle branch block and mild LV systolic dysfunction. Due to this, I had set her up for cardiac catheterization and also subclavian artery stenosis evaluation by angiogram on 09/27/2014  Followed by angioplasty attempt 10/25/14. She has stage III chronic kidney disease almost on the verge of stage IV. She was found to have patent grafts but very high-grade left subclavian artery stenosis. No new symptoms. No scheduled for repeat subclavian angiogram and possible angioplasty utilizing left brachial and possible femoral access and admitted for IV hydration.  She states that presently she does not have any symptoms of any chest pain or weakness in the extremities, denies any dizziness or chest pain. No dyspnea, PND or orthopnea. She has slight LE edema occasionally.  Her diabetes is controlled on metformin. No other specific complaints. She does get tingling and numbness in her feet which are fairly chronic and stable. There is no history to suggest prior TIA. No recent weight changes. No palpitations.  Past Medical History  Diagnosis Date  . Hypertension   . Hypercholesteremia   . Stenosis of subclavian artery   . Type II diabetes mellitus   . Myocardial infarction 2003  . Arthritis     "qwhere"    Past Surgical History  Procedure Laterality Date  . Coronary artery bypass graft  2003    in [place], [place]  . Abdominal hysterectomy  1970  . Left heart catheterization with coronary/graft  angiogram N/A 09/27/2014    Procedure: LEFT HEART CATHETERIZATION WITH Beatrix Fetters;  Surgeon: Laverda Page, MD;  Location[facility] C[facility] LAB;  Service: Cardiovascular;  Laterality: N/A;  . Unilateral upper extremeity angiogram N/A 09/27/2014    Procedure: SUBCLAVIAN ARTERIOGRAM ;  Surgeon: Laverda Page, MD;  Location: [facility] CATH LAB;  Service: Cardiovascular;  Laterality: N/A;  . Cataract extraction Bilateral 2000's  . Visceral angiogram N/A 10/25/2014    Procedure: SUBCLAVIAN ANGIOGRAM;  Surgeon: Adrian Prows, MD;  Location: [facility] CATH LAB;  Service: Cardiovascular;  Laterality: N/A;  . Cardiac catheterization      History reviewed. No pertinent family history. Social History:  reports that she has never smoked. She has never used smokeless tobacco. She reports that she does not drink alcohol or use illicit drugs.  Allergies:  Allergies  Allergen Reactions  . Crestor [Rosuvastatin] Other (See Comments)    Myalgias  . Lipitor [Atorvastatin] Other (See Comments)    myalgias  . Zocor [Simvastatin] Other (See Comments)    myalgias    Medications Prior to Admission  Medication Sig Dispense Refill  . aspirin EC 81 MG tablet Take 81 mg by mouth at bedtime.     . Calcium Citrate-Vitamin D (CALCIUM CITRATE + PO) Take 4 capsules by mouth daily.    . carvedilol (COREG) 6.25 MG tablet Take 6.25 mg by mouth 2 (two) times daily with a meal.    . chlorthalidone (HYGROTON) 25 MG tablet Take 1 tablet (25 mg total) by mouth daily.    . Cholecalciferol (  VITAMIN D3) 2000 UNITS capsule Take 2,000 Units by mouth daily.    . clopidogrel (PLAVIX) 75 MG tablet Take 1 tablet (75 mg total) by mouth daily with breakfast. 30 tablet 0  . gabapentin (NEURONTIN) 100 MG capsule Take 200 mg by mouth at bedtime.    Marland Kitchen glimepiride (AMARYL) 2 MG tablet Take 2 mg by mouth daily. 30 minutes before breakfast.    . lovastatin (MEVACOR) 40 MG tablet Take 40 mg by mouth at bedtime.    . metFORMIN (GLUCOPHAGE) 1000 MG  tablet Take 1 tablet (1,000 mg total) by mouth 2 (two) times daily with a meal.    . acetaminophen (TYLENOL) 325 MG tablet Take 650 mg by mouth daily as needed (pain).    Marland Kitchen amLODipine (NORVASC) 10 MG tablet Take 10 mg by mouth at bedtime.     Marland Kitchen glimepiride (AMARYL) 2 MG tablet Take 2 mg by mouth daily with breakfast.    . losartan (COZAAR) 100 MG tablet Take 100 mg by mouth daily.        Review of Systems - General:Not Present- Anorexia, Fatigue and Fever. Skin:Not Present- Itching and Rash. HEENT:Not Present- Headache. Respiratory:Not Present- Cough, Decreased Exercise Tolerance and Dyspnea. Cardiovascular:Not Present- Chest Pain, Edema, Orthopnea, Paroxysmal Nocturnal Dyspnea and Shortness of Breath. Gastrointestinal:Not Present- Change in Bowel Habits, Constipation and Nausea. Musculoskeletal:Not Present- Joint Swelling. Neurological:Not Present- Focal Neurological Symptoms. Endocrine:Not Present- Appetite Changes, Cold Intolerance and Heat Intolerance. Hematology:Not Present- Anemia, Petechiae and Prolonged Bleeding.  Blood pressure 157/49, pulse 66, temperature 97.9 F (36.6 C), temperature source Oral, resp. rate 18, height '5\' 2"'  (1.575 m), weight 60.283 kg (132 lb 14.4 oz), SpO2 98 %.    General: Moderately built and normal body habitus who is in no acute distress. Appears younger than stated age. Alert Ox3.   There is no cyanosis. HEENT: normal limits. No JVD.   CARDIAC EXAM: S1, S2 widely split with respiratory variation, no gallop present. No murmur.   CHEST EXAM: No tenderness of chest wall. LUNGS: Clear to percuss and auscultate.  ABDOMEN: No hepatosplenomegaly. BS normal in all 4 quadrants. Abdomen is non-tender.   EXTREMITY: Full range of movementes, No edema. MUSCULOSKELETAL EXAM: Intact with full range of motion in all 4 extremities.   NEUROLOGIC EXAM: Grossly intact without any focal deficits. Alert O x 3.   VASCULAR EXAM: No skin  breakdown. Carotids prominent left carotid and soft right carotid bruit. Lef supraclavicular (subclavian) bruit. Left brachial and radial pulse barely palpable and feeble. RIght upper extremity pulse normal. Extremities: Femoral pulse normal. Popliteal pulse normal ; Pedal pulse normal. Prominent abdominal bruit present. No varicose veins.  Results for orders placed or performed during the hospital encounter of 11/08/14 (from the past 48 hour(s))  Basic metabolic panel     Status: Abnormal   Collection Time: 11/08/14  6:25 PM  Result Value Ref Range   Sodium 142 135 - 145 mmol/L   Potassium 4.0 3.5 - 5.1 mmol/L   Chloride 108 96 - 112 mmol/L   CO2 22 19 - 32 mmol/L   Glucose, Bld 110 (H) 70 - 99 mg/dL   BUN 21 6 - 23 mg/dL   Creatinine, Ser 1.50 (H) 0.50 - 1.10 mg/dL   Calcium 9.1 8.4 - 10.5 mg/dL   GFR calc non Af Amer 30 (L) >90 mL/min   GFR calc Af Amer 35 (L) >90 mL/min    Comment: (NOTE) The eGFR has been calculated using the CKD EPI equation. This calculation  has not been validated in all clinical situations. eGFR's persistently <90 mL/min signify possible Chronic Kidney Disease.    Anion gap 12 5 - 15   No results found.  Labs:   Lab Results  Component Value Date   WBC 9.5 06/03/2013   HGB 9.5* 09/27/2014   HCT 28.0* 09/27/2014   MCV 83.5 06/03/2013   PLT 207 06/03/2013     Recent Labs Lab 11/08/14 1825  NA 142  K 4.0  CL 108  CO2 22  BUN 21  CREATININE 1.50*  CALCIUM 9.1  GLUCOSE 110*   Lab Results  Component Value Date   TROPONINI <0.30 03/09/2013    Lipid Panel     Component Value Date/Time   CHOL 193 03/09/2013 0500   TRIG 78 03/09/2013 0500   HDL 57 03/09/2013 0500   CHOLHDL 3.4 03/09/2013 0500   VLDL 16 03/09/2013 0500   LDLCALC 120* 03/09/2013 0500    EKG 09/01/2014: Normal sinus rhythm at rate of 58 bpm, left atrial enlargement, left bundle branch block. No further analysis due to left bundle branch block.  EKG 07/16/2013: Normal sinus  rhythm at rate of 58 bpm, left atrial abnormality, poor R-wave progression, cannot exclude anterior infarct, old diffuse nonspecific T-wave abnormality.  Assessment/Plan 1. Subclavian artery stenosis, left, symptomatic. Coronary and subclavian arteriogram on 09/27/2014: Patent LIMA to LAD, SVG to OM1 and OM 3 and SVG to RCA. Mid LAD with a high-grade 99% stenosis but protected by LIMA to LAD. Left subclavian artery 95% stenosis. Failed attempt at crossing the left subclavian artery on 10/25/14, now admitted for prehydration and reattempt at [facility] angioplasty.  Upper extremity arterial duplex 06/28/2013: 1. Monophasic waveform and dampened waveform throughout the left upper extremity suggest proximal left subclavian artery stenosis, however antegrade flow is evident in the left vertebral artery, and hence may not be of hemodynamic significance. Blood pressure right arm was 947 mmHg systolic and left arm was 096 mmHg systolic. 2. Less than 50% stenosis in the right subclavian artery with antegrade flow evident in the right vertebral artery suggests no hemodynamic significance. 3. Clinical correlation is recommended. If patient has significant left arm claudication, consider further workup including angiography.  2. Atherosclerosis of native coronary artery of native heart with angina pectoris  CABG 06/04/2002 with LIMA to LAD and SVG to ramus intermediate and OM1 and OM 2 in a sequential fashion, SVG to distal RCA. Performed at [facility] in [place], [place].  Coronary and subclavian arteriogram on 09/27/2014: Patent LIMA to LAD, SVG to OM1 and OM 3 and SVG to RCA. Mid LAD with a high-grade 99% stenosis but protected by LIMA to LAD. Left subclavian artery 95% stenosis.  3. LBBB (left bundle branch block)   4. Hyperlipidemia, group A   Rec: Patient admitted for elective PTA of the left subclavian artery and pre procedure hydration. Her recent onset of left bundle branch block and  LV dysfunction suggest that this may be related to symptomatic left subclavian stenosis. Patient also has experienced several episodes of sudden onset of dizziness and also gait imbalance that she states happens sporadically. Due to symptomatic left subclavian artery stenosis and to protect LIMA to LAD, I have recommended that we proceed with angioplasty of the same. Patient understands less than 1% risk of death, stroke, 2-3% risk of renal failure, bleeding and infection but not limited to these.   Laverda Page, MD 11/08/2014, 10:19 PM [facility] Pager: [phone] Office: [phone] If no answer:  Cell:  [phone]

## 2014-11-09 ENCOUNTER — Encounter (HOSPITAL_COMMUNITY)
Admission: AD | Disposition: A | Discharge status: Home / Self Care | Payer: Self-pay | Source: Ambulatory Visit | Attending: Cardiology

## 2014-11-09 ENCOUNTER — Encounter (HOSPITAL_COMMUNITY): Payer: Self-pay | Admitting: Cardiology

## 2014-11-09 DIAGNOSIS — E119 Type 2 diabetes mellitus without complications: Secondary | ICD-10-CM | POA: Diagnosis not present

## 2014-11-09 DIAGNOSIS — I447 Left bundle-branch block, unspecified: Secondary | ICD-10-CM | POA: Diagnosis not present

## 2014-11-09 DIAGNOSIS — I771 Stricture of artery: Secondary | ICD-10-CM | POA: Diagnosis not present

## 2014-11-09 DIAGNOSIS — I70208 Unspecified atherosclerosis of native arteries of extremities, other extremity: Secondary | ICD-10-CM | POA: Diagnosis not present

## 2014-11-09 DIAGNOSIS — N183 Chronic kidney disease, stage 3 (moderate): Secondary | ICD-10-CM | POA: Diagnosis not present

## 2014-11-09 DIAGNOSIS — I25119 Atherosclerotic heart disease of native coronary artery with unspecified angina pectoris: Secondary | ICD-10-CM | POA: Diagnosis not present

## 2014-11-09 DIAGNOSIS — I129 Hypertensive chronic kidney disease with stage 1 through stage 4 chronic kidney disease, or unspecified chronic kidney disease: Secondary | ICD-10-CM | POA: Diagnosis not present

## 2014-11-09 HISTORY — PX: UNILATERAL UPPER EXTREMEITY ANGIOGRAM: SHX5517

## 2014-11-09 LAB — BASIC METABOLIC PANEL
ANION GAP: 7 (ref 5–15)
BUN: 15 mg/dL (ref 6–23)
CHLORIDE: 116 mmol/L — AB (ref 96–112)
CO2: 21 mmol/L (ref 19–32)
Calcium: 7.2 mg/dL — ABNORMAL LOW (ref 8.4–10.5)
Creatinine, Ser: 1.05 mg/dL (ref 0.50–1.10)
GFR calc Af Amer: 54 mL/min — ABNORMAL LOW (ref >90)
GFR calc non Af Amer: 46 mL/min — ABNORMAL LOW (ref >90)
GLUCOSE: 59 mg/dL — AB (ref 70–99)
Potassium: 3 mmol/L — ABNORMAL LOW (ref 3.5–5.1)
Sodium: 144 mmol/L (ref 135–145)

## 2014-11-09 LAB — GLUCOSE, CAPILLARY
GLUCOSE-CAPILLARY: 107 mg/dL — AB (ref 70–99)
Glucose-Capillary: 109 mg/dL — ABNORMAL HIGH (ref 70–99)
Glucose-Capillary: 162 mg/dL — ABNORMAL HIGH (ref 70–99)

## 2014-11-09 LAB — CBC
HEMATOCRIT: 30.1 % — AB (ref 36.0–46.0)
Hemoglobin: 9.5 g/dL — ABNORMAL LOW (ref 12.0–15.0)
MCH: 26.2 pg (ref 26.0–34.0)
MCHC: 31.6 g/dL (ref 30.0–36.0)
MCV: 82.9 fL (ref 78.0–100.0)
Platelets: 210 10*3/uL (ref 150–400)
RBC: 3.63 MIL/uL — ABNORMAL LOW (ref 3.87–5.11)
RDW: 15.4 % (ref 11.5–15.5)
WBC: 8.2 10*3/uL (ref 4.0–10.5)

## 2014-11-09 LAB — POCT ACTIVATED CLOTTING TIME
ACTIVATED CLOTTING TIME: 269 s
ACTIVATED CLOTTING TIME: 221 s
ACTIVATED CLOTTING TIME: 190 s
Activated Clotting Time: 178 seconds

## 2014-11-09 LAB — PROTIME-INR
INR: 1.1 (ref 0.00–1.49)
PROTHROMBIN TIME: 14.4 s (ref 11.6–15.2)

## 2014-11-09 SURGERY — UNILATERAL UPPER EXTREMEITY ANGIOGRAM
Anesthesia: LOCAL

## 2014-11-09 MED ORDER — HEPARIN (PORCINE) IN NACL 2-0.9 UNIT/ML-% IJ SOLN
INTRAMUSCULAR | Status: AC
  Filled 2014-11-09: qty 1000

## 2014-11-09 MED ORDER — CHLORTHALIDONE 25 MG PO TABS
25.0000 mg | ORAL_TABLET | Freq: Every day | ORAL | Status: DC
  Administered 2014-11-10: 10:00:00 25 mg via ORAL
  Filled 2014-11-09: qty 1

## 2014-11-09 MED ORDER — GABAPENTIN 100 MG PO CAPS
200.0000 mg | ORAL_CAPSULE | Freq: Every day | ORAL | Status: DC
  Administered 2014-11-09: 200 mg via ORAL
  Filled 2014-11-09 (×2): qty 2

## 2014-11-09 MED ORDER — POTASSIUM CHLORIDE CRYS ER 20 MEQ PO TBCR
20.0000 meq | EXTENDED_RELEASE_TABLET | Freq: Once | ORAL | Status: AC
  Administered 2014-11-09: 20 meq via ORAL
  Filled 2014-11-09: qty 1

## 2014-11-09 MED ORDER — LIDOCAINE HCL (PF) 1 % IJ SOLN
INTRAMUSCULAR | Status: AC
  Filled 2014-11-09: qty 30

## 2014-11-09 MED ORDER — FENTANYL CITRATE 0.05 MG/ML IJ SOLN
INTRAMUSCULAR | Status: AC
  Filled 2014-11-09: qty 2

## 2014-11-09 MED ORDER — AMLODIPINE BESYLATE 10 MG PO TABS
10.0000 mg | ORAL_TABLET | Freq: Every day | ORAL | Status: DC
  Administered 2014-11-09: 22:00:00 10 mg via ORAL
  Filled 2014-11-09 (×2): qty 1

## 2014-11-09 MED ORDER — ASPIRIN 81 MG PO CHEW
81.0000 mg | CHEWABLE_TABLET | Freq: Once | ORAL | Status: AC
  Administered 2014-11-09: 81 mg via ORAL
  Filled 2014-11-09: qty 1

## 2014-11-09 MED ORDER — CLOPIDOGREL BISULFATE 75 MG PO TABS
75.0000 mg | ORAL_TABLET | Freq: Once | ORAL | Status: AC
  Administered 2014-11-09: 75 mg via ORAL
  Filled 2014-11-09: qty 1

## 2014-11-09 MED ORDER — MIDAZOLAM HCL 2 MG/2ML IJ SOLN
INTRAMUSCULAR | Status: AC
  Filled 2014-11-09: qty 2

## 2014-11-09 MED ORDER — GLIMEPIRIDE 2 MG PO TABS: 2.0000 mg | ORAL_TABLET | Freq: Every day | ORAL | Status: DC

## 2014-11-09 MED ORDER — SODIUM CHLORIDE 0.9 % IJ SOLN: 3.0000 mL | INTRAMUSCULAR | Status: DC | PRN

## 2014-11-09 MED ORDER — LIVING WELL WITH DIABETES BOOK
Freq: Once | Status: AC
  Administered 2014-11-09: 23:00:00
  Filled 2014-11-09: qty 1

## 2014-11-09 MED ORDER — CARVEDILOL 3.125 MG PO TABS
6.2500 mg | ORAL_TABLET | Freq: Two times a day (BID) | ORAL | Status: DC
  Administered 2014-11-10: 6.25 mg via ORAL
  Filled 2014-11-09: qty 2

## 2014-11-09 MED ORDER — ACETAMINOPHEN 325 MG PO TABS: 650.0000 mg | ORAL_TABLET | Freq: Every day | ORAL | Status: DC | PRN

## 2014-11-09 MED ORDER — GLIMEPIRIDE 2 MG PO TABS
2.0000 mg | ORAL_TABLET | Freq: Every day | ORAL | Status: DC
  Administered 2014-11-10: 2 mg via ORAL
  Filled 2014-11-09 (×2): qty 1

## 2014-11-09 MED ORDER — PRAVASTATIN SODIUM 80 MG PO TABS
80.0000 mg | ORAL_TABLET | Freq: Every day | ORAL | Status: DC
  Administered 2014-11-09: 80 mg via ORAL
  Filled 2014-11-09 (×2): qty 1

## 2014-11-09 MED ORDER — SODIUM CHLORIDE 0.9 % IJ SOLN: 3.0000 mL | Freq: Two times a day (BID) | INTRAMUSCULAR | Status: DC

## 2014-11-09 MED ORDER — CLOPIDOGREL BISULFATE 75 MG PO TABS
75.0000 mg | ORAL_TABLET | Freq: Every day | ORAL | Status: DC
  Administered 2014-11-10: 75 mg via ORAL
  Filled 2014-11-09: qty 1

## 2014-11-09 MED ORDER — INSULIN ASPART 100 UNIT/ML ~~LOC~~ SOLN: 0.0000 [IU] | Freq: Three times a day (TID) | SUBCUTANEOUS | Status: DC

## 2014-11-09 MED ORDER — LABETALOL HCL 5 MG/ML IV SOLN
INTRAVENOUS | Status: AC
  Filled 2014-11-09: qty 4

## 2014-11-09 MED ORDER — SODIUM CHLORIDE 0.9 % IV SOLN
INTRAVENOUS | Status: DC
Start: 2014-11-09 — End: 2014-11-09
  Administered 2014-11-09: 09:00:00 via INTRAVENOUS

## 2014-11-09 MED ORDER — ASPIRIN EC 81 MG PO TBEC: 81.0000 mg | DELAYED_RELEASE_TABLET | Freq: Every day | ORAL | Status: DC

## 2014-11-09 MED ORDER — ONDANSETRON HCL 4 MG/2ML IJ SOLN: 4.0000 mg | Freq: Four times a day (QID) | INTRAMUSCULAR | Status: DC | PRN

## 2014-11-09 MED ORDER — SODIUM CHLORIDE 0.9 % IV SOLN: 1.0000 mL/kg/h | INTRAVENOUS | Status: AC

## 2014-11-09 MED ORDER — NITROGLYCERIN 1 MG/10 ML FOR IR/CATH LAB
INTRA_ARTERIAL | Status: AC
  Filled 2014-11-09: qty 10

## 2014-11-09 MED ORDER — SODIUM CHLORIDE 0.9 % IV SOLN: 250.0000 mL | INTRAVENOUS | Status: DC | PRN

## 2014-11-09 MED ORDER — HEPARIN SODIUM (PORCINE) 1000 UNIT/ML IJ SOLN
INTRAMUSCULAR | Status: AC
  Filled 2014-11-09: qty 1

## 2014-11-09 NOTE — Progress Notes (Addendum)
Order for sheath removal verified per post procedural orders. Procedure explained to patient and left brachial artery access site assessed: puffy, but soft, palpable radial pulses. 7 [place] Sheath removed and manual pressure applied for 30 minutes. Pre, peri, & post procedural vitals: HR 60, RR 14, O2 Sat upper 99, BP 170/45, Pain 0. Distal pulses remained intact after sheath removal. Access site level 0 and dressed tegaderm and arm immobilized.  Vernona RiegerLaura, RN and Thereasa Distanceodney, RCIS  confirmed condition of site. Post procedural instructions discussed with return demonstration from patient.

## 2014-11-09 NOTE — Interval H&P Note (Signed)
History and Physical Interval Note:  11/09/2014 12:04 PM  [name]  has presented today for surgery, with the diagnosis of pad  The various methods of treatment have been discussed with the patient and family. After consideration of risks, benefits and other options for treatment, the patient has consented to  Procedure(s): Left subclavian stent  (N/A) as a surgical intervention .  The patient's history has been reviewed, patient examined, no change in status, stable for surgery.  I have reviewed the patient's chart and labs.  Questions were answered to the patient's satisfaction.     Pamella PertGANJI,JAGADEESH R

## 2014-11-09 NOTE — CV Procedure (Signed)
Procedure performed:  Ultrasound-guided access of the left brachial artery, micropuncture access, placement of a 6 [place] and eventually a 7 French 45 cm  Terumo pedicle sheath, subclavian arteriogram, left, PTA and stenting of the 95% stenosed left subclavian artery with implantation of a 8.0 x 29 mm Genesis balloon expandable stent with reduction of stenosis from 95% to 0%.  Indication: Patient with symptomatic left subclavian artery stenosis, history of CABG with LIMA to LAD which supplies proximally occluded LAD. Recently patient had developed left bundle branch block with decreased EF from normal to mild to moderate decrease in LVEF. Hence felt LAD ischemia was the etiology and patient now brought for elective angioplasty of the subclavian artery. About a week ago, patient had attempted angioplasty via right femoral arterial access which was a failure as I was unable to cross the high-grade stenosis. Hence brachial access approach was recommended.  Angiographic data: Left subdural artery is subtotally occluded, there was to and fro blood flow into the left vertebral artery suggestive and indicated above vertebral steal.  Interventional data successful  PTA and stenting of the 95% stenosed left subclavian artery with implantation of a 8.0 x 29 mm Genesis balloon expandable stent with reduction of stenosis from 95% to 0%.  Recommendation: Patient will be observed overnight due to chronic renal insufficiency and large brachial artery access sheath, if stable will be discharged home in the morning. A total of 130 mL of contrast was utilized for diagnostic and interventional procedure.  Technique: Under sterile precautions using ultrasound guidance to access the left brachial artery, micropuncture kit was utilized and brachial artery was accessed with a single anterior wall stick. I was able to then advanced a 6 [place] sheath. I then advanced a 0.035 x 1 90 cm Versacore wire, which easily crossed the  subclavian artery stenosis without any complications. Following this I exchanged the 6 French sheath to a 7 French 45 cm  Terumo Pinnacle sheath. Then angiogram was performed in the LAO projection. Following this I perform predilatation with a 5.0 x 40 mm Powerflex balloon at 12 atmospheric pressure for 45 seconds. This was followed by implantation of a 8.0 x 29 mm balloon expandable Genesis Opta which was deployed at 10 atmospheric pressure for 40 seconds followed by advancing the same balloon into the proximal end of the stent and hanging the balloon into the arch of the aorta, second inflation at 10 atmospheric pressure for 60 seconds was performed. This was followed by repeat angiography, excellent result was evident. The sheath was then pulled out of the body and a 7 French short sheath, Pinnacle was advanced into the left brachial artery and sutured in place. During the procedure patient received heparin and ACT was maintained at greater than 200. Patient tolerated the procedure well. There was no immediate complications.

## 2014-11-10 DIAGNOSIS — I25119 Atherosclerotic heart disease of native coronary artery with unspecified angina pectoris: Secondary | ICD-10-CM | POA: Diagnosis not present

## 2014-11-10 DIAGNOSIS — N183 Chronic kidney disease, stage 3 (moderate): Secondary | ICD-10-CM | POA: Diagnosis not present

## 2014-11-10 DIAGNOSIS — E119 Type 2 diabetes mellitus without complications: Secondary | ICD-10-CM | POA: Diagnosis not present

## 2014-11-10 DIAGNOSIS — I129 Hypertensive chronic kidney disease with stage 1 through stage 4 chronic kidney disease, or unspecified chronic kidney disease: Secondary | ICD-10-CM | POA: Diagnosis not present

## 2014-11-10 DIAGNOSIS — I447 Left bundle-branch block, unspecified: Secondary | ICD-10-CM | POA: Diagnosis not present

## 2014-11-10 DIAGNOSIS — I70208 Unspecified atherosclerosis of native arteries of extremities, other extremity: Secondary | ICD-10-CM | POA: Diagnosis not present

## 2014-11-10 LAB — GLUCOSE, CAPILLARY
GLUCOSE-CAPILLARY: 74 mg/dL (ref 70–99)
Glucose-Capillary: 105 mg/dL — ABNORMAL HIGH (ref 70–99)

## 2014-11-10 LAB — BASIC METABOLIC PANEL
ANION GAP: 7 (ref 5–15)
BUN: 14 mg/dL (ref 6–23)
CHLORIDE: 109 mmol/L (ref 96–112)
CO2: 24 mmol/L (ref 19–32)
CREATININE: 1.21 mg/dL — AB (ref 0.50–1.10)
Calcium: 8.8 mg/dL (ref 8.4–10.5)
GFR calc non Af Amer: 39 mL/min — ABNORMAL LOW (ref >90)
GFR, EST AFRICAN AMERICAN: 45 mL/min — AB (ref >90)
Glucose, Bld: 108 mg/dL — ABNORMAL HIGH (ref 70–99)
Potassium: 3.9 mmol/L (ref 3.5–5.1)
Sodium: 140 mmol/L (ref 135–145)

## 2014-11-10 MED ORDER — METFORMIN HCL 1000 MG PO TABS: 1000.0000 mg | ORAL_TABLET | Freq: Two times a day (BID) | ORAL | Status: DC

## 2014-11-10 NOTE — Discharge Summary (Signed)
Physician Discharge Summary  Patient ID: Sonya Chavez MRN: [id number] DOB/AGE: Aug 30, 1925 81 y.o.  Admit date: 10/24/2014 Discharge date: 11/10/2014  Primary Discharge Diagnosis 1. Subclavian artery stenosis, left, symptomatic S/P 8.0 x 29 mm Genesis balloon expandable stent with reduction of stenosis from 95% to 0%.  Secondary Discharge Diagnosis 2. Atherosclerosis of native coronary artery of native heart with angina pectoris  CABG 06/04/2002 with LIMA to LAD and SVG to ramus intermediate and OM1 and OM 2 in a sequential fashion, SVG to distal RCA. Performed at [facility] [place].  Coronary and subclavian arteriogram on 09/27/2014: Patent LIMA to LAD, SVG to OM1 and OM 3 and SVG to RCA. Mid LAD with a high-grade 99% stenosis but protected by LIMA to LAD. Left subclavian artery 95% to 99% stenosis.  3. LBBB (left bundle branch block)   4. Hyperlipidemia, group A   5. Chronic stage 3 kidney disease due to DM  6. DM well controlled.  Significant Diagnostic Studies: Left subclavian arteriogram on 10/25/2014, unsuccessful attempt at angioplasty via right femoral arterial access approach. Successful attempt on 11/10/2014 via left brachial approach. Ultrasound-guided access of the left brachial artery, micropuncture access, placement of a 6 [place] and eventually a 7 French 45 cm Terumo pedicle sheath, subclavian arteriogram, left, PTA and stenting of the 95% stenosed left subclavian artery with implantation of a 8.0 x 29 mm Genesis balloon expandable stent with reduction of stenosis from 95% to 0%.  Hospital Course: Patient is a 81 year old African American female fairly active with PMH of CAD S/p CABG in 2003, DM2, and HTN admitted to the hospital on an elective fashion for IV hydration prior to evaluation of PAD, CAD, hypertension and subclavian steal syndrome. [place] was [place] in [place].   Recently has had abnormal EKG in the form of new  onset left bundle branch block and mild to moderate new LV systolic dysfunction. Due to this, I had set her up for cardiac catheterization and also subclavian artery stenosis evaluation by angiogram on 09/27/2014 as there was BP differential and also Doppler suggestion of left SCA stenosis. She was again readmitted to the hospital on 10/24/2014 for hydration followed by possible angioplasty the following day.  Angioplasty was unsuccessful in spite of attempt at trying to cross a very high-grade 95-99% stenosis. Due to stage III chronic kidney disease, advanced age, she was scheduled for elective angioplasty via left brachial artery access approach, patient admitted for rehydration 1 day prior to the procedure. She underwent successful and uneventful angioplasty on 11/09/2014  to the left subclavian artery with implantation of balloon expandable stent.  Post procedure she had good pulse in the left radial artery. She had no periprocedural complications. Felt stable for discharge with outpatient follow-up.   Recommendations on discharge: We will continue to hold losartan for now, once her serum creatinine is stable, we will proceed with restarting losartan. She will start metformin starting tomorrow.    Discharge Exam: Blood pressure 159/37, pulse 66, temperature 98.1 F (36.7 C), temperature source Oral, resp. rate 18, height 5\' 1"  (1.549 m), weight 59.7 kg (131 lb 9.8 oz), SpO2 96 %.   General: Moderately built and normal body habitus, petite  who is in no acute distress. Appears younger than stated age. Alert Ox3.   There is no cyanosis. HEENT: normal limits. No JVD.   CARDIAC EXAM: S1, S2 widely split with respiratory variation, no gallop present. No murmur.   CHEST EXAM: No tenderness  of chest wall. LUNGS: Clear to percuss and auscultate.  ABDOMEN: No hepatosplenomegaly. BS normal in all 4 quadrants. Abdomen is non-tender.   EXTREMITY: Full range of movementes, No edema.  MUSCULOSKELETAL EXAM: Intact with full range of motion in all 4 extremities. Left brachial artery access without hematoma or pulsatile mass.  NEUROLOGIC EXAM: Grossly intact without any focal deficits. Alert O x 3.    Labs:   Lab Results  Component Value Date   WBC 8.2 11/09/2014   HGB 9.5* 11/09/2014   HCT 30.1* 11/09/2014   MCV 82.9 11/09/2014   PLT 210 11/09/2014     Recent Labs Lab 11/10/14 0525  NA 140  K 3.9  CL 109  CO2 24  BUN 14  CREATININE 1.21*  CALCIUM 8.8  GLUCOSE 108*   EKG 09/01/2014: Normal sinus rhythm at rate of 58 bpm, left atrial enlargement, left bundle branch block. No further analysis due to left bundle branch block.  EKG 07/16/2013: Normal sinus rhythm at rate of 58 bpm, left atrial abnormality, poor R-wave progression, cannot exclude anterior infarct, old diffuse nonspecific T-wave abnormality    FOLLOW UP PLANS AND APPOINTMENTS     Discharge Instructions    Discharge patient    Complete by:  As directed             Medication List    STOP taking these medications        amLODipine 5 MG tablet  Commonly known as:  NORVASC     losartan 100 MG tablet  Commonly known as:  COZAAR     metFORMIN 1000 MG tablet  Commonly known as:  GLUCOPHAGE      TAKE these medications        acetaminophen 325 MG tablet  Commonly known as:  TYLENOL  Take 650 mg by mouth daily as needed (pain).     aspirin EC 81 MG tablet  Take 81 mg by mouth at bedtime.     CALCIUM CITRATE + PO  Take 4 capsules by mouth daily.     carvedilol 6.25 MG tablet  Commonly known as:  COREG  Take 6.25 mg by mouth 2 (two) times daily with a meal.     chlorthalidone 25 MG tablet  Commonly known as:  HYGROTON  Take 1 tablet (25 mg total) by mouth daily.     clopidogrel 75 MG tablet  Commonly known as:  PLAVIX  Take 1 tablet (75 mg total) by mouth daily with breakfast.     gabapentin 100 MG capsule  Commonly known as:  NEURONTIN  Take 200 mg by mouth at  bedtime.     glimepiride 2 MG tablet  Commonly known as:  AMARYL  Take 2 mg by mouth daily with breakfast.     lovastatin 40 MG tablet  Commonly known as:  MEVACOR  Take 40 mg by mouth at bedtime.     Vitamin D3 2000 UNITS capsule  Take 2,000 Units by mouth daily.       Follow-up Information    Follow up with Pamella Pert, MD On 11/07/2014.   Specialty:  Cardiology   Why:  at 11:45   Contact information:   [address] [place] [address] [phone]       Follow up with Pamella Pert, MD.   Specialty:  Cardiology   Why:  Keep previous appointment   Contact information:   [address] [place] [address] [phone]  [facility]  Office: [phone]

## 2014-11-11 ENCOUNTER — Ambulatory Visit (HOSPITAL_COMMUNITY): Admission: RE | Admit: 2014-11-11 | Payer: Medicare Other | Source: Ambulatory Visit | Admitting: Cardiology

## 2014-11-11 ENCOUNTER — Encounter (HOSPITAL_COMMUNITY): Admission: RE | Payer: Self-pay | Source: Ambulatory Visit

## 2014-11-11 SURGERY — VISCERAL ANGIOGRAM

## 2014-11-21 DIAGNOSIS — I25119 Atherosclerotic heart disease of native coronary artery with unspecified angina pectoris: Secondary | ICD-10-CM | POA: Diagnosis not present

## 2014-11-21 DIAGNOSIS — I771 Stricture of artery: Secondary | ICD-10-CM | POA: Diagnosis not present

## 2014-11-21 DIAGNOSIS — E1122 Type 2 diabetes mellitus with diabetic chronic kidney disease: Secondary | ICD-10-CM | POA: Diagnosis not present

## 2014-11-21 DIAGNOSIS — I10 Essential (primary) hypertension: Secondary | ICD-10-CM | POA: Diagnosis not present

## 2014-11-29 DIAGNOSIS — H40033 Anatomical narrow angle, bilateral: Secondary | ICD-10-CM | POA: Diagnosis not present

## 2014-11-29 DIAGNOSIS — E119 Type 2 diabetes mellitus without complications: Secondary | ICD-10-CM | POA: Diagnosis not present

## 2014-11-30 DIAGNOSIS — Z780 Asymptomatic menopausal state: Secondary | ICD-10-CM | POA: Diagnosis not present

## 2014-11-30 DIAGNOSIS — I10 Essential (primary) hypertension: Secondary | ICD-10-CM | POA: Diagnosis not present

## 2014-11-30 DIAGNOSIS — M810 Age-related osteoporosis without current pathological fracture: Secondary | ICD-10-CM | POA: Diagnosis not present

## 2014-11-30 DIAGNOSIS — E119 Type 2 diabetes mellitus without complications: Secondary | ICD-10-CM | POA: Diagnosis not present

## 2014-11-30 DIAGNOSIS — I2510 Atherosclerotic heart disease of native coronary artery without angina pectoris: Secondary | ICD-10-CM | POA: Diagnosis not present

## 2014-11-30 DIAGNOSIS — R8299 Other abnormal findings in urine: Secondary | ICD-10-CM | POA: Diagnosis not present

## 2014-11-30 DIAGNOSIS — E785 Hyperlipidemia, unspecified: Secondary | ICD-10-CM | POA: Diagnosis not present

## 2014-11-30 DIAGNOSIS — N390 Urinary tract infection, site not specified: Secondary | ICD-10-CM | POA: Diagnosis not present

## 2014-12-02 DIAGNOSIS — I10 Essential (primary) hypertension: Secondary | ICD-10-CM | POA: Diagnosis not present

## 2014-12-07 DIAGNOSIS — I129 Hypertensive chronic kidney disease with stage 1 through stage 4 chronic kidney disease, or unspecified chronic kidney disease: Secondary | ICD-10-CM | POA: Diagnosis not present

## 2014-12-07 DIAGNOSIS — I10 Essential (primary) hypertension: Secondary | ICD-10-CM | POA: Diagnosis not present

## 2014-12-07 DIAGNOSIS — E119 Type 2 diabetes mellitus without complications: Secondary | ICD-10-CM | POA: Diagnosis not present

## 2014-12-07 DIAGNOSIS — I771 Stricture of artery: Secondary | ICD-10-CM | POA: Diagnosis not present

## 2014-12-07 DIAGNOSIS — Z23 Encounter for immunization: Secondary | ICD-10-CM | POA: Diagnosis not present

## 2014-12-07 DIAGNOSIS — Z6826 Body mass index (BMI) 26.0-26.9, adult: Secondary | ICD-10-CM | POA: Diagnosis not present

## 2014-12-07 DIAGNOSIS — Z0000 Encounter for general adult medical examination without abnormal findings: Secondary | ICD-10-CM | POA: Diagnosis not present

## 2014-12-07 DIAGNOSIS — E785 Hyperlipidemia, unspecified: Secondary | ICD-10-CM | POA: Diagnosis not present

## 2014-12-07 DIAGNOSIS — I25118 Atherosclerotic heart disease of native coronary artery with other forms of angina pectoris: Secondary | ICD-10-CM | POA: Diagnosis not present

## 2014-12-07 DIAGNOSIS — E1140 Type 2 diabetes mellitus with diabetic neuropathy, unspecified: Secondary | ICD-10-CM | POA: Diagnosis not present

## 2014-12-07 DIAGNOSIS — I6522 Occlusion and stenosis of left carotid artery: Secondary | ICD-10-CM | POA: Diagnosis not present

## 2014-12-07 DIAGNOSIS — D649 Anemia, unspecified: Secondary | ICD-10-CM | POA: Diagnosis not present

## 2014-12-19 DIAGNOSIS — I447 Left bundle-branch block, unspecified: Secondary | ICD-10-CM | POA: Diagnosis not present

## 2014-12-19 DIAGNOSIS — I25119 Atherosclerotic heart disease of native coronary artery with unspecified angina pectoris: Secondary | ICD-10-CM | POA: Diagnosis not present

## 2014-12-19 DIAGNOSIS — I771 Stricture of artery: Secondary | ICD-10-CM | POA: Diagnosis not present

## 2014-12-19 DIAGNOSIS — E1151 Type 2 diabetes mellitus with diabetic peripheral angiopathy without gangrene: Secondary | ICD-10-CM | POA: Diagnosis not present

## 2014-12-31 ENCOUNTER — Encounter (HOSPITAL_COMMUNITY): Payer: Self-pay

## 2014-12-31 ENCOUNTER — Emergency Department (HOSPITAL_COMMUNITY)
Admission: EM | Admit: 2014-12-31 | Discharge: 2014-12-31 | Disposition: A | Discharge status: Home / Self Care | Payer: No Typology Code available for payment source | Attending: Emergency Medicine | Admitting: Emergency Medicine

## 2014-12-31 DIAGNOSIS — I10 Essential (primary) hypertension: Secondary | ICD-10-CM | POA: Diagnosis not present

## 2014-12-31 DIAGNOSIS — Z79899 Other long term (current) drug therapy: Secondary | ICD-10-CM | POA: Insufficient documentation

## 2014-12-31 DIAGNOSIS — Z043 Encounter for examination and observation following other accident: Secondary | ICD-10-CM | POA: Diagnosis present

## 2014-12-31 DIAGNOSIS — M1990 Unspecified osteoarthritis, unspecified site: Secondary | ICD-10-CM | POA: Diagnosis not present

## 2014-12-31 DIAGNOSIS — E119 Type 2 diabetes mellitus without complications: Secondary | ICD-10-CM | POA: Diagnosis not present

## 2014-12-31 DIAGNOSIS — Y939 Activity, unspecified: Secondary | ICD-10-CM | POA: Diagnosis not present

## 2014-12-31 DIAGNOSIS — Y999 Unspecified external cause status: Secondary | ICD-10-CM | POA: Insufficient documentation

## 2014-12-31 DIAGNOSIS — E780 Pure hypercholesterolemia: Secondary | ICD-10-CM | POA: Insufficient documentation

## 2014-12-31 DIAGNOSIS — Y92481 Parking lot as the place of occurrence of the external cause: Secondary | ICD-10-CM | POA: Insufficient documentation

## 2014-12-31 DIAGNOSIS — I252 Old myocardial infarction: Secondary | ICD-10-CM | POA: Insufficient documentation

## 2014-12-31 DIAGNOSIS — Z9889 Other specified postprocedural states: Secondary | ICD-10-CM | POA: Insufficient documentation

## 2014-12-31 DIAGNOSIS — Z951 Presence of aortocoronary bypass graft: Secondary | ICD-10-CM | POA: Diagnosis not present

## 2014-12-31 NOTE — ED Notes (Signed)
Pt placed into gown and on monitor upon arrival to room. Pt monitored by blood pressure, pulse ox, and 12 lead.  
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
## 2014-12-31 NOTE — ED Notes (Signed)
EDP at bedside  

## 2015-02-09 DIAGNOSIS — I771 Stricture of artery: Secondary | ICD-10-CM | POA: Diagnosis not present

## 2015-02-09 DIAGNOSIS — I6522 Occlusion and stenosis of left carotid artery: Secondary | ICD-10-CM | POA: Diagnosis not present

## 2015-03-07 DIAGNOSIS — N183 Chronic kidney disease, stage 3 (moderate): Secondary | ICD-10-CM | POA: Diagnosis not present

## 2015-03-07 DIAGNOSIS — I10 Essential (primary) hypertension: Secondary | ICD-10-CM | POA: Diagnosis not present

## 2015-03-07 DIAGNOSIS — D649 Anemia, unspecified: Secondary | ICD-10-CM | POA: Diagnosis not present

## 2015-03-07 DIAGNOSIS — Z6825 Body mass index (BMI) 25.0-25.9, adult: Secondary | ICD-10-CM | POA: Diagnosis not present

## 2015-03-07 DIAGNOSIS — E1140 Type 2 diabetes mellitus with diabetic neuropathy, unspecified: Secondary | ICD-10-CM | POA: Diagnosis not present

## 2015-03-07 DIAGNOSIS — I129 Hypertensive chronic kidney disease with stage 1 through stage 4 chronic kidney disease, or unspecified chronic kidney disease: Secondary | ICD-10-CM | POA: Diagnosis not present

## 2015-03-07 DIAGNOSIS — E119 Type 2 diabetes mellitus without complications: Secondary | ICD-10-CM | POA: Diagnosis not present

## 2015-04-06 DIAGNOSIS — N183 Chronic kidney disease, stage 3 (moderate): Secondary | ICD-10-CM | POA: Diagnosis not present

## 2015-04-06 DIAGNOSIS — E119 Type 2 diabetes mellitus without complications: Secondary | ICD-10-CM | POA: Diagnosis not present

## 2015-04-06 DIAGNOSIS — Z6825 Body mass index (BMI) 25.0-25.9, adult: Secondary | ICD-10-CM | POA: Diagnosis not present

## 2015-06-13 DIAGNOSIS — N183 Chronic kidney disease, stage 3 (moderate): Secondary | ICD-10-CM | POA: Diagnosis not present

## 2015-06-13 DIAGNOSIS — Z6824 Body mass index (BMI) 24.0-24.9, adult: Secondary | ICD-10-CM | POA: Diagnosis not present

## 2015-06-13 DIAGNOSIS — E119 Type 2 diabetes mellitus without complications: Secondary | ICD-10-CM | POA: Diagnosis not present

## 2015-06-13 DIAGNOSIS — E784 Other hyperlipidemia: Secondary | ICD-10-CM | POA: Diagnosis not present

## 2015-06-13 DIAGNOSIS — Z23 Encounter for immunization: Secondary | ICD-10-CM | POA: Diagnosis not present

## 2015-06-13 DIAGNOSIS — M170 Bilateral primary osteoarthritis of knee: Secondary | ICD-10-CM | POA: Diagnosis not present

## 2015-06-13 DIAGNOSIS — I10 Essential (primary) hypertension: Secondary | ICD-10-CM | POA: Diagnosis not present

## 2015-06-26 DIAGNOSIS — I771 Stricture of artery: Secondary | ICD-10-CM | POA: Diagnosis not present

## 2015-06-26 DIAGNOSIS — I447 Left bundle-branch block, unspecified: Secondary | ICD-10-CM | POA: Diagnosis not present

## 2015-06-26 DIAGNOSIS — I25119 Atherosclerotic heart disease of native coronary artery with unspecified angina pectoris: Secondary | ICD-10-CM | POA: Diagnosis not present

## 2015-06-26 DIAGNOSIS — E1151 Type 2 diabetes mellitus with diabetic peripheral angiopathy without gangrene: Secondary | ICD-10-CM | POA: Diagnosis not present

## 2015-07-04 ENCOUNTER — Other Ambulatory Visit: Payer: Self-pay | Admitting: Cardiology

## 2015-07-04 ENCOUNTER — Ambulatory Visit
Admission: RE | Admit: 2015-07-04 | Discharge: 2015-07-04 | Disposition: A | Discharge status: Home / Self Care | Payer: Medicare Other | Source: Ambulatory Visit | Attending: Cardiology | Admitting: Cardiology

## 2015-07-04 DIAGNOSIS — R059 Cough, unspecified: Secondary | ICD-10-CM

## 2015-07-04 DIAGNOSIS — R05 Cough: Secondary | ICD-10-CM

## 2015-09-12 DIAGNOSIS — E119 Type 2 diabetes mellitus without complications: Secondary | ICD-10-CM | POA: Diagnosis not present

## 2015-09-12 DIAGNOSIS — I25118 Atherosclerotic heart disease of native coronary artery with other forms of angina pectoris: Secondary | ICD-10-CM | POA: Diagnosis not present

## 2015-09-12 DIAGNOSIS — E784 Other hyperlipidemia: Secondary | ICD-10-CM | POA: Diagnosis not present

## 2015-09-12 DIAGNOSIS — I10 Essential (primary) hypertension: Secondary | ICD-10-CM | POA: Diagnosis not present

## 2015-09-12 DIAGNOSIS — M79673 Pain in unspecified foot: Secondary | ICD-10-CM | POA: Diagnosis not present

## 2015-09-12 DIAGNOSIS — Z6824 Body mass index (BMI) 24.0-24.9, adult: Secondary | ICD-10-CM | POA: Diagnosis not present

## 2015-10-22 ENCOUNTER — Emergency Department (HOSPITAL_BASED_OUTPATIENT_CLINIC_OR_DEPARTMENT_OTHER): Payer: Medicare Other

## 2015-10-22 ENCOUNTER — Emergency Department (HOSPITAL_COMMUNITY)
Admission: EM | Admit: 2015-10-22 | Discharge: 2015-10-22 | Disposition: A | Discharge status: Home / Self Care | Payer: Medicare Other | Attending: Emergency Medicine | Admitting: Emergency Medicine

## 2015-10-22 ENCOUNTER — Encounter (HOSPITAL_COMMUNITY): Payer: Self-pay | Admitting: *Deleted

## 2015-10-22 DIAGNOSIS — I252 Old myocardial infarction: Secondary | ICD-10-CM | POA: Insufficient documentation

## 2015-10-22 DIAGNOSIS — Z7982 Long term (current) use of aspirin: Secondary | ICD-10-CM | POA: Insufficient documentation

## 2015-10-22 DIAGNOSIS — Z9889 Other specified postprocedural states: Secondary | ICD-10-CM | POA: Diagnosis not present

## 2015-10-22 DIAGNOSIS — R21 Rash and other nonspecific skin eruption: Secondary | ICD-10-CM | POA: Diagnosis not present

## 2015-10-22 DIAGNOSIS — R2241 Localized swelling, mass and lump, right lower limb: Secondary | ICD-10-CM | POA: Diagnosis not present

## 2015-10-22 DIAGNOSIS — E7800 Pure hypercholesterolemia, unspecified: Secondary | ICD-10-CM | POA: Insufficient documentation

## 2015-10-22 DIAGNOSIS — M79609 Pain in unspecified limb: Secondary | ICD-10-CM | POA: Diagnosis not present

## 2015-10-22 DIAGNOSIS — I10 Essential (primary) hypertension: Secondary | ICD-10-CM | POA: Insufficient documentation

## 2015-10-22 DIAGNOSIS — E119 Type 2 diabetes mellitus without complications: Secondary | ICD-10-CM | POA: Insufficient documentation

## 2015-10-22 DIAGNOSIS — Z7984 Long term (current) use of oral hypoglycemic drugs: Secondary | ICD-10-CM | POA: Insufficient documentation

## 2015-10-22 DIAGNOSIS — M1990 Unspecified osteoarthritis, unspecified site: Secondary | ICD-10-CM | POA: Insufficient documentation

## 2015-10-22 DIAGNOSIS — Z79899 Other long term (current) drug therapy: Secondary | ICD-10-CM | POA: Diagnosis not present

## 2015-10-22 DIAGNOSIS — Z951 Presence of aortocoronary bypass graft: Secondary | ICD-10-CM | POA: Diagnosis not present

## 2015-10-22 DIAGNOSIS — M7989 Other specified soft tissue disorders: Secondary | ICD-10-CM

## 2015-10-22 DIAGNOSIS — Z7901 Long term (current) use of anticoagulants: Secondary | ICD-10-CM | POA: Insufficient documentation

## 2015-10-22 LAB — CBC WITH DIFFERENTIAL/PLATELET
Basophils Absolute: 0 10*3/uL (ref 0.0–0.1)
Basophils Relative: 0 %
Eosinophils Absolute: 0.1 10*3/uL (ref 0.0–0.7)
Eosinophils Relative: 1 %
HCT: 30.7 % — ABNORMAL LOW (ref 36.0–46.0)
HEMOGLOBIN: 9.4 g/dL — AB (ref 12.0–15.0)
Lymphocytes Relative: 23 %
Lymphs Abs: 2.2 10*3/uL (ref 0.7–4.0)
MCH: 24.7 pg — ABNORMAL LOW (ref 26.0–34.0)
MCHC: 30.6 g/dL (ref 30.0–36.0)
MCV: 80.6 fL (ref 78.0–100.0)
Monocytes Absolute: 0.5 10*3/uL (ref 0.1–1.0)
Monocytes Relative: 5 %
NEUTROS PCT: 70 %
Neutro Abs: 6.5 10*3/uL (ref 1.7–7.7)
Platelets: 186 10*3/uL (ref 150–400)
RBC: 3.81 MIL/uL — ABNORMAL LOW (ref 3.87–5.11)
RDW: 16.2 % — ABNORMAL HIGH (ref 11.5–15.5)
WBC: 9.3 10*3/uL (ref 4.0–10.5)

## 2015-10-22 LAB — BASIC METABOLIC PANEL
Anion gap: 11 (ref 5–15)
BUN: 19 mg/dL (ref 6–20)
CO2: 25 mmol/L (ref 22–32)
CREATININE: 1.28 mg/dL — AB (ref 0.44–1.00)
Calcium: 9.7 mg/dL (ref 8.9–10.3)
Chloride: 107 mmol/L (ref 101–111)
GFR calc Af Amer: 42 mL/min — ABNORMAL LOW (ref >60)
GFR calc non Af Amer: 36 mL/min — ABNORMAL LOW (ref >60)
Glucose, Bld: 175 mg/dL — ABNORMAL HIGH (ref 65–99)
Potassium: 4.5 mmol/L (ref 3.5–5.1)
Sodium: 143 mmol/L (ref 135–145)

## 2015-10-22 MED ORDER — CEPHALEXIN 500 MG PO CAPS: 500.0000 mg | ORAL_CAPSULE | Freq: Four times a day (QID) | ORAL | Status: DC

## 2015-10-22 NOTE — Discharge Instructions (Signed)
Please read and follow all provided instructions.  Your diagnoses today include:  1. Rash and nonspecific skin eruption   2. Swelling of lower extremity    Tests performed today include:  Ultrasound of leg - no blood clot  Blood counts and electrolytes  Vital signs. See below for your results today.   Medications prescribed:   Keflex (cephalexin) - antibiotic  You have been prescribed an antibiotic medicine: take the entire course of medicine even if you are feeling better. Stopping early can cause the antibiotic not to work.  Take any prescribed medications only as directed.  Home care instructions:  Follow any educational materials contained in this packet.  BE VERY CAREFUL not to take multiple medicines containing Tylenol (also called acetaminophen). Doing so can lead to an overdose which can damage your liver and cause liver failure and possibly death.   Follow-up instructions: Please follow-up with your primary care provider in the next 3 days for further evaluation of your symptoms.   Return instructions:   Please return to the Emergency Department if you experience worsening symptoms.   Please return if you have any other emergent concerns.  Additional Information:  Your vital signs today were: BP 176/52 mmHg   Pulse 73   Temp(Src) 98.2 F (36.8 C) (Oral)   Resp 18   SpO2 97% If your blood pressure (BP) was elevated above 135/85 this visit, please have this repeated by your doctor within one month. --------------

## 2015-10-22 NOTE — ED Notes (Signed)
Spoke w/[place],[facility] [employer]

## 2015-10-22 NOTE — Progress Notes (Signed)
VASCULAR LAB PRELIMINARY  PRELIMINARY  PRELIMINARY  PRELIMINARY  Right lower extremity venous duplex completed.    Preliminary report:  There is no DVT or SVT noted in the right lower extremity.   Kerstyn Coryell, RVT 10/22/2015, 4:25 PM

## 2015-10-22 NOTE — ED Notes (Signed)
Patient transported to vascular. 

## 2015-10-22 NOTE — ED Provider Notes (Signed)
CSN: [id number]     Arrival date & time 10/22/15  1057 History   First MD Initiated Contact with Patient 10/22/15 1352     Chief Complaint  Patient presents with  . Leg Pain     (Consider location/radiation/quality/duration/timing/severity/associated sxs/prior Treatment) HPI Comments: Patients with history of diabetes, hypertension, subclavian stenosis on Plavix -- presents with complaint of right ankle rash which is been present over the past 2 weeks. She describes a red rash that is somewhat sore and itchy. She has been using Neosporin on the area without relief. No fevers, nausea, vomiting, or diarrhea. No problems in the left leg. No pain with ambulation or walking. No pain with bearing weight. Patient has noted that her right calf seems a little more swollen today. Denies history of DVT. Patient denies any chest pain or shortness of breath. The onset of this condition was acute. The course is constant. Aggravating factors: none. Alleviating factors: none.    The history is provided by the patient.    Past Medical History  Diagnosis Date  . Hypertension   . Hypercholesteremia   . Stenosis of subclavian artery (HCC)   . Type II diabetes mellitus (HCC)   . Myocardial infarction (HCC) 2003  . Arthritis     "qwhere"   Past Surgical History  Procedure Laterality Date  . Coronary artery bypass graft  2003    in [place], [place]  . Abdominal hysterectomy  1970  . Left heart catheterization with coronary/graft angiogram N/A 09/27/2014    Procedure: LEFT HEART CATHETERIZATION WITH Isabel Caprice;  Surgeon: Pamella Pert, MD;  Location: [facility] CATH LAB;  Service: Cardiovascular;  Laterality: N/A;  . Unilateral upper extremeity angiogram N/A 09/27/2014    Procedure: SUBCLAVIAN ARTERIOGRAM ;  Surgeon: Pamella Pert, MD;  Location: [facility] C[facility] LAB;  Service: Cardiovascular;  Laterality: N/A;  . Cataract extraction Bilateral 2000's  . Visceral angiogram N/A 10/25/2014    Procedure:  SUBCLAVIAN ANGIOGRAM;  Surgeon: Yates Decamp, MD;  Location: [facility] CATH LAB;  Service: Cardiovascular;  Laterality: N/A;  . Cardiac catheterization    . Unilateral upper extremeity angiogram N/A 11/09/2014    Procedure: Left subclavian stent ;  Surgeon: Yates Decamp, MD;  Location: [facility] C[facility] LAB;  Service: Cardiovascular;  Laterality: N/A;   History reviewed. No pertinent family history. Social History  Substance Use Topics  . Smoking status: Never Smoker   . Smokeless tobacco: Never Used  . Alcohol Use: No   OB History    No data available     Review of Systems  Constitutional: Negative for fever.  HENT: Negative for rhinorrhea and sore throat.   Eyes: Negative for redness.  Respiratory: Negative for cough.   Cardiovascular: Positive for leg swelling. Negative for chest pain.  Gastrointestinal: Negative for nausea, vomiting, abdominal pain and diarrhea.  Genitourinary: Negative for dysuria.  Musculoskeletal: Positive for myalgias. Negative for gait problem.  Skin: Positive for color change. Negative for rash.  Neurological: Negative for headaches.      Allergies  Crestor; Lipitor; and Zocor  Home Medications   Prior to Admission medications   Medication Sig Start Date End Date Taking? Authorizing Provider  acetaminophen (TYLENOL) 325 MG tablet Take 650 mg by mouth daily as needed (pain).    Historical Provider, MD  alendronate (FOSAMAX) 70 MG tablet Take 70 mg by mouth every Monday.  12/08/14   Historical Provider, MD  amLODipine (NORVASC) 10 MG tablet Take 10 mg by mouth at bedtime.  10/12/14  Historical Provider, MD  aspirin EC 81 MG tablet Take 81 mg by mouth at bedtime.     Historical Provider, MD  Calcium Citrate-Vitamin D (CALCIUM CITRATE + PO) Take 4 capsules by mouth daily.    Historical Provider, MD  carvedilol (COREG) 6.25 MG tablet Take 6.25 mg by mouth 2 (two) times daily with a meal.    Historical Provider, MD  chlorthalidone (HYGROTON) 25 MG tablet Take 1 tablet (25 mg  total) by mouth daily. 09/29/14   Yates DecampJay Ganji, MD  Cholecalciferol (VITAMIN D3) 2000 UNITS capsule Take 2,000 Units by mouth daily.    Historical Provider, MD  clopidogrel (PLAVIX) 75 MG tablet Take 1 tablet (75 mg total) by mouth daily with breakfast. 03/09/13   Myra RudeJeremy E Schmitz, MD  famotidine (PEPCID) 40 MG tablet Take 40 mg by mouth daily. 11/23/14   Historical Provider, MD  gabapentin (NEURONTIN) 100 MG capsule Take 200 mg by mouth at bedtime.    Historical Provider, MD  glimepiride (AMARYL) 2 MG tablet Take 2 mg by mouth daily with breakfast.    Historical Provider, MD  glimepiride (AMARYL) 2 MG tablet Take 2 mg by mouth daily. 30 minutes before breakfast. 08/23/14 08/23/15  Historical Provider, MD  losartan (COZAAR) 100 MG tablet Take 100 mg by mouth daily. 12/08/14   Historical Provider, MD  lovastatin (MEVACOR) 40 MG tablet Take 40 mg by mouth at bedtime.    Historical Provider, MD  metFORMIN (GLUCOPHAGE) 1000 MG tablet Take 1 tablet (1,000 mg total) by mouth 2 (two) times daily with a meal. 11/11/14   Yates DecampJay Ganji, MD   BP 176/52 mmHg  Pulse 73  Temp(Src) 98.2 F (36.8 C) (Oral)  Resp 18  SpO2 97%   Physical Exam  Constitutional: She appears well-developed and well-nourished.  HENT:  Head: Normocephalic and atraumatic.  Eyes: Conjunctivae are normal. Right eye exhibits no discharge. Left eye exhibits no discharge.  Neck: Normal range of motion. Neck supple.  Cardiovascular: Normal rate, regular rhythm and normal heart sounds.   Pulses:      Dorsalis pedis pulses are 2+ on the right side, and 2+ on the left side.  Pulmonary/Chest: Effort normal and breath sounds normal.  Abdominal: Soft. There is no tenderness.  Musculoskeletal:       Right hip: Normal.       Right knee: Normal.       Right ankle: She exhibits swelling (trace). She exhibits normal range of motion. No tenderness.       Right upper leg: Normal.       Right lower leg: She exhibits edema (1+). She exhibits no tenderness.        Right foot: Normal.  Neurological: She is alert.  Skin: Skin is warm and dry.  Patient with irregular erythema with minimal warmth circumferentially around the right ankle.  Psychiatric: She has a normal mood and affect.  Nursing note and vitals reviewed.   ED Course  Procedures (including critical care time) Labs Review Labs Reviewed  CBC WITH DIFFERENTIAL/PLATELET - Abnormal; Notable for the following:    RBC 3.81 (*)    Hemoglobin 9.4 (*)    HCT 30.7 (*)    MCH 24.7 (*)    RDW 16.2 (*)    All other components within normal limits  BASIC METABOLIC PANEL - Abnormal; Notable for the following:    Glucose, Bld 175 (*)    Creatinine, Ser 1.28 (*)    GFR calc non Af Amer 36 (*)  GFR calc Af Amer 42 (*)    All other components within normal limits    Imaging Review No results found. I have personally reviewed and evaluated these images and lab results as part of my medical decision-making.   EKG Interpretation None       2:33 PM Patient seen and examined. Work-up initiated. Medications ordered.   Vital signs reviewed and are as follows: BP 176/52 mmHg  Pulse 73  Temp(Src) 98.2 F (36.8 C) (Oral)  Resp 18  SpO2 97%  4:13 PM Handoff to Lawyer PA-C at shift change. Pending LE doppler. If neg, can d/c to home with keflex, avoid neomycin, and PCP f/u with week.   Pt urged to return with worsening pain, worsening swelling, expanding area of redness or streaking up extremity, fever, or any other concerns. Urged to take complete course of antibiotics as prescribed. Pt verbalizes understanding and agrees with plan.    MDM   Final diagnoses:  Rash and nonspecific skin eruption  Swelling of lower extremity   Patient with ankle rash, calf swelling. No Sign of acute arterial insufficiency and patient does not describe claudication. Ultrasound ordered to rule out DVT. Rash likely allergic versus infectious. Given diabetes, will cover with Keflex.  Renne Crigler,  PA-C 10/22/15 1615  Glynn Octave, MD 10/22/15 [phone]

## 2015-10-22 NOTE — ED Notes (Signed)
Pt reports right ankle discomfort, has been to pcp and diagnosed with arthritis. Now has red rash and itching to ankle and lower leg. Denies fever.

## 2015-10-25 DIAGNOSIS — Z6824 Body mass index (BMI) 24.0-24.9, adult: Secondary | ICD-10-CM | POA: Diagnosis not present

## 2015-10-25 DIAGNOSIS — E119 Type 2 diabetes mellitus without complications: Secondary | ICD-10-CM | POA: Diagnosis not present

## 2015-10-25 DIAGNOSIS — M79671 Pain in right foot: Secondary | ICD-10-CM | POA: Diagnosis not present

## 2015-10-25 DIAGNOSIS — I872 Venous insufficiency (chronic) (peripheral): Secondary | ICD-10-CM | POA: Diagnosis not present

## 2015-12-11 DIAGNOSIS — M810 Age-related osteoporosis without current pathological fracture: Secondary | ICD-10-CM | POA: Diagnosis not present

## 2015-12-11 DIAGNOSIS — E784 Other hyperlipidemia: Secondary | ICD-10-CM | POA: Diagnosis not present

## 2015-12-11 DIAGNOSIS — R8299 Other abnormal findings in urine: Secondary | ICD-10-CM | POA: Diagnosis not present

## 2015-12-11 DIAGNOSIS — N390 Urinary tract infection, site not specified: Secondary | ICD-10-CM | POA: Diagnosis not present

## 2015-12-11 DIAGNOSIS — E119 Type 2 diabetes mellitus without complications: Secondary | ICD-10-CM | POA: Diagnosis not present

## 2015-12-18 DIAGNOSIS — Z0000 Encounter for general adult medical examination without abnormal findings: Secondary | ICD-10-CM | POA: Diagnosis not present

## 2015-12-18 DIAGNOSIS — Z1389 Encounter for screening for other disorder: Secondary | ICD-10-CM | POA: Diagnosis not present

## 2015-12-18 DIAGNOSIS — M1990 Unspecified osteoarthritis, unspecified site: Secondary | ICD-10-CM | POA: Diagnosis not present

## 2015-12-18 DIAGNOSIS — E119 Type 2 diabetes mellitus without complications: Secondary | ICD-10-CM | POA: Diagnosis not present

## 2015-12-18 DIAGNOSIS — I25118 Atherosclerotic heart disease of native coronary artery with other forms of angina pectoris: Secondary | ICD-10-CM | POA: Diagnosis not present

## 2015-12-18 DIAGNOSIS — D649 Anemia, unspecified: Secondary | ICD-10-CM | POA: Diagnosis not present

## 2015-12-18 DIAGNOSIS — E784 Other hyperlipidemia: Secondary | ICD-10-CM | POA: Diagnosis not present

## 2015-12-18 DIAGNOSIS — I10 Essential (primary) hypertension: Secondary | ICD-10-CM | POA: Diagnosis not present

## 2015-12-18 DIAGNOSIS — E1140 Type 2 diabetes mellitus with diabetic neuropathy, unspecified: Secondary | ICD-10-CM | POA: Diagnosis not present

## 2015-12-18 DIAGNOSIS — Z6824 Body mass index (BMI) 24.0-24.9, adult: Secondary | ICD-10-CM | POA: Diagnosis not present

## 2015-12-18 DIAGNOSIS — I872 Venous insufficiency (chronic) (peripheral): Secondary | ICD-10-CM | POA: Diagnosis not present

## 2015-12-18 DIAGNOSIS — I6522 Occlusion and stenosis of left carotid artery: Secondary | ICD-10-CM | POA: Diagnosis not present

## 2016-03-18 DIAGNOSIS — I25118 Atherosclerotic heart disease of native coronary artery with other forms of angina pectoris: Secondary | ICD-10-CM | POA: Diagnosis not present

## 2016-03-18 DIAGNOSIS — E1140 Type 2 diabetes mellitus with diabetic neuropathy, unspecified: Secondary | ICD-10-CM | POA: Diagnosis not present

## 2016-03-18 DIAGNOSIS — E784 Other hyperlipidemia: Secondary | ICD-10-CM | POA: Diagnosis not present

## 2016-03-18 DIAGNOSIS — Z6824 Body mass index (BMI) 24.0-24.9, adult: Secondary | ICD-10-CM | POA: Diagnosis not present

## 2016-03-18 DIAGNOSIS — E119 Type 2 diabetes mellitus without complications: Secondary | ICD-10-CM | POA: Diagnosis not present

## 2016-03-18 DIAGNOSIS — I10 Essential (primary) hypertension: Secondary | ICD-10-CM | POA: Diagnosis not present

## 2016-04-01 ENCOUNTER — Ambulatory Visit (INDEPENDENT_AMBULATORY_CARE_PROVIDER_SITE_OTHER): Payer: Medicare Other | Admitting: Podiatry

## 2016-04-01 ENCOUNTER — Encounter: Payer: Self-pay | Admitting: Podiatry

## 2016-04-01 VITALS — BP 144/60 | HR 65 | Ht 62.0 in | Wt 130.0 lb

## 2016-04-01 DIAGNOSIS — M79675 Pain in left toe(s): Secondary | ICD-10-CM | POA: Diagnosis not present

## 2016-04-01 DIAGNOSIS — L600 Ingrowing nail: Secondary | ICD-10-CM

## 2016-04-01 DIAGNOSIS — M79605 Pain in left leg: Secondary | ICD-10-CM

## 2016-04-01 DIAGNOSIS — B351 Tinea unguium: Secondary | ICD-10-CM

## 2016-04-01 DIAGNOSIS — M79674 Pain in right toe(s): Secondary | ICD-10-CM

## 2016-04-01 DIAGNOSIS — M79604 Pain in right leg: Secondary | ICD-10-CM

## 2016-04-01 NOTE — Progress Notes (Signed)
   Subjective:    Patient ID: [place], female    DOB: [birth year], 82 y.o.   MRN: [id number]  HPI Chief Complaint  Patient presents with  . Nail Problem    Bilateral; Right foot-great toe-nail discoloration & thickened nail; Left foot-great toe-mredial; needs to be checked for ingrown toenail      Review of Systems  Musculoskeletal: Positive for arthralgias.  Skin: Positive for rash.       Objective:   Physical Exam        Assessment & Plan:

## 2016-04-01 NOTE — Patient Instructions (Signed)

## 2016-04-02 NOTE — Progress Notes (Signed)
Subjective:     Patient ID: Sonya Chavez, female   DOB: January 21, 1926, 82 y.o.   MRN: [id number]  HPI patient presents stating she's had a lot of problems with her nails they are thick incurvated and can get sore at times   Review of Systems  All other systems reviewed and are negative.      Objective:   Physical Exam  Constitutional: She is oriented to person, place, and time.  Cardiovascular: Intact distal pulses.   Musculoskeletal: Normal range of motion.  Neurological: She is oriented to person, place, and time.  Skin: Skin is warm and dry.  Nursing note and vitals reviewed.  neurovascular status was intact with patient found to have thick yellow brittle nailbeds 1-5 both feet the become painful and make it hard to wear shoe gear comfortably. Patient states that she cannot cut the nails themselves and the get incurvated and at time she has drainage. Has good digital perfusion and is well oriented 3     Assessment:     Mycotic nail infection 1-5 both feet with pain    Plan:     H&P conditions reviewed and debridement accomplished 1-5 both feet while discussing the possibility for permanent procedure if symptoms continue to persist. Reappoint for routine care or earlier if symptoms indicate

## 2016-06-24 DIAGNOSIS — I25119 Atherosclerotic heart disease of native coronary artery with unspecified angina pectoris: Secondary | ICD-10-CM | POA: Diagnosis not present

## 2016-06-24 DIAGNOSIS — I771 Stricture of artery: Secondary | ICD-10-CM | POA: Diagnosis not present

## 2016-06-24 DIAGNOSIS — E1151 Type 2 diabetes mellitus with diabetic peripheral angiopathy without gangrene: Secondary | ICD-10-CM | POA: Diagnosis not present

## 2016-06-24 DIAGNOSIS — I447 Left bundle-branch block, unspecified: Secondary | ICD-10-CM | POA: Diagnosis not present

## 2016-06-25 DIAGNOSIS — Z6824 Body mass index (BMI) 24.0-24.9, adult: Secondary | ICD-10-CM | POA: Diagnosis not present

## 2016-06-25 DIAGNOSIS — L988 Other specified disorders of the skin and subcutaneous tissue: Secondary | ICD-10-CM | POA: Diagnosis not present

## 2016-06-25 DIAGNOSIS — I25118 Atherosclerotic heart disease of native coronary artery with other forms of angina pectoris: Secondary | ICD-10-CM | POA: Diagnosis not present

## 2016-06-25 DIAGNOSIS — M25559 Pain in unspecified hip: Secondary | ICD-10-CM | POA: Diagnosis not present

## 2016-06-25 DIAGNOSIS — E784 Other hyperlipidemia: Secondary | ICD-10-CM | POA: Diagnosis not present

## 2016-06-25 DIAGNOSIS — I10 Essential (primary) hypertension: Secondary | ICD-10-CM | POA: Diagnosis not present

## 2016-06-25 DIAGNOSIS — Z0000 Encounter for general adult medical examination without abnormal findings: Secondary | ICD-10-CM | POA: Diagnosis not present

## 2016-06-25 DIAGNOSIS — Z23 Encounter for immunization: Secondary | ICD-10-CM | POA: Diagnosis not present

## 2016-06-25 DIAGNOSIS — H269 Unspecified cataract: Secondary | ICD-10-CM | POA: Diagnosis not present

## 2016-06-25 DIAGNOSIS — E1140 Type 2 diabetes mellitus with diabetic neuropathy, unspecified: Secondary | ICD-10-CM | POA: Diagnosis not present

## 2016-06-25 DIAGNOSIS — M810 Age-related osteoporosis without current pathological fracture: Secondary | ICD-10-CM | POA: Diagnosis not present

## 2016-06-25 DIAGNOSIS — E119 Type 2 diabetes mellitus without complications: Secondary | ICD-10-CM | POA: Diagnosis not present

## 2016-07-08 ENCOUNTER — Ambulatory Visit (INDEPENDENT_AMBULATORY_CARE_PROVIDER_SITE_OTHER): Payer: Medicare Other | Admitting: Podiatry

## 2016-07-08 DIAGNOSIS — M79676 Pain in unspecified toe(s): Secondary | ICD-10-CM

## 2016-07-08 DIAGNOSIS — B351 Tinea unguium: Secondary | ICD-10-CM

## 2016-07-08 DIAGNOSIS — M79605 Pain in left leg: Secondary | ICD-10-CM

## 2016-07-08 DIAGNOSIS — M79604 Pain in right leg: Secondary | ICD-10-CM

## 2016-07-10 NOTE — Progress Notes (Signed)
Subjective:     Patient ID: [place], female   DOB: 25-Jan-1926, 82 y.o.   MRN: [id number]  HPI patient presents with painful nailbeds 1-5 both feet with thick yellow brittle debris that's painful   Review of Systems     Objective:   Physical Exam Neurovascular status intact with thick yellow brittle nailbeds 1-5 both feet that are painful    Assessment:     Mycotic nail infection with pain 1-5 both feet    Plan:     Debride painful nailbeds 1-5 both feet with no iatrogenic bleeding noted

## 2016-07-18 DIAGNOSIS — R0989 Other specified symptoms and signs involving the circulatory and respiratory systems: Secondary | ICD-10-CM | POA: Diagnosis not present

## 2016-07-18 DIAGNOSIS — Z955 Presence of coronary angioplasty implant and graft: Secondary | ICD-10-CM | POA: Diagnosis not present

## 2016-07-24 DIAGNOSIS — I25119 Atherosclerotic heart disease of native coronary artery with unspecified angina pectoris: Secondary | ICD-10-CM | POA: Diagnosis not present

## 2016-07-24 DIAGNOSIS — I771 Stricture of artery: Secondary | ICD-10-CM | POA: Diagnosis not present

## 2016-07-24 DIAGNOSIS — I6523 Occlusion and stenosis of bilateral carotid arteries: Secondary | ICD-10-CM | POA: Diagnosis not present

## 2016-07-24 DIAGNOSIS — I10 Essential (primary) hypertension: Secondary | ICD-10-CM | POA: Diagnosis not present

## 2016-07-25 DIAGNOSIS — I872 Venous insufficiency (chronic) (peripheral): Secondary | ICD-10-CM | POA: Diagnosis not present

## 2016-07-25 DIAGNOSIS — Z6824 Body mass index (BMI) 24.0-24.9, adult: Secondary | ICD-10-CM | POA: Diagnosis not present

## 2016-08-19 DIAGNOSIS — E119 Type 2 diabetes mellitus without complications: Secondary | ICD-10-CM | POA: Diagnosis not present

## 2016-08-19 DIAGNOSIS — H1132 Conjunctival hemorrhage, left eye: Secondary | ICD-10-CM | POA: Diagnosis not present

## 2016-08-19 DIAGNOSIS — Z961 Presence of intraocular lens: Secondary | ICD-10-CM | POA: Diagnosis not present

## 2016-09-10 DIAGNOSIS — M1990 Unspecified osteoarthritis, unspecified site: Secondary | ICD-10-CM | POA: Diagnosis not present

## 2016-09-10 DIAGNOSIS — I10 Essential (primary) hypertension: Secondary | ICD-10-CM | POA: Diagnosis not present

## 2016-09-10 DIAGNOSIS — E784 Other hyperlipidemia: Secondary | ICD-10-CM | POA: Diagnosis not present

## 2016-09-10 DIAGNOSIS — E119 Type 2 diabetes mellitus without complications: Secondary | ICD-10-CM | POA: Diagnosis not present

## 2016-09-10 DIAGNOSIS — Z6825 Body mass index (BMI) 25.0-25.9, adult: Secondary | ICD-10-CM | POA: Diagnosis not present

## 2016-10-07 ENCOUNTER — Ambulatory Visit (INDEPENDENT_AMBULATORY_CARE_PROVIDER_SITE_OTHER): Payer: Medicare Other | Admitting: Podiatry

## 2016-10-07 DIAGNOSIS — M79604 Pain in right leg: Secondary | ICD-10-CM

## 2016-10-07 DIAGNOSIS — B351 Tinea unguium: Secondary | ICD-10-CM | POA: Diagnosis not present

## 2016-10-07 DIAGNOSIS — M79676 Pain in unspecified toe(s): Secondary | ICD-10-CM

## 2016-10-07 DIAGNOSIS — M79605 Pain in left leg: Secondary | ICD-10-CM

## 2016-10-07 NOTE — Progress Notes (Signed)
Subjective:     Patient ID: [place], female   DOB: [birth year], 83 y.o.   MRN: [id number]  HPI diabetic with thick nail disease 1-5 both feet that are painful and she cannot cut   Review of Systems     Objective:   Physical Exam Neurovascular status intact with thick yellow brittle nailbeds 1-5 both feet    Assessment:     Mycotic nail infections with pain 1-5 both feet    Plan:     Debris painful nailbeds 1-5 both feet with no iatrogenic bleeding noted

## 2016-12-17 ENCOUNTER — Emergency Department (HOSPITAL_COMMUNITY): Payer: Medicare Other

## 2016-12-17 ENCOUNTER — Inpatient Hospital Stay (HOSPITAL_COMMUNITY)
Admission: EM | Admit: 2016-12-17 | Discharge: 2016-12-25 | DRG: 329 | Disposition: A | Discharge status: Home / Self Care | Payer: Medicare Other | Attending: Internal Medicine | Admitting: Internal Medicine

## 2016-12-17 ENCOUNTER — Encounter (HOSPITAL_COMMUNITY): Payer: Self-pay | Admitting: Emergency Medicine

## 2016-12-17 DIAGNOSIS — S30851A Superficial foreign body of abdominal wall, initial encounter: Secondary | ICD-10-CM | POA: Diagnosis present

## 2016-12-17 DIAGNOSIS — I129 Hypertensive chronic kidney disease with stage 1 through stage 4 chronic kidney disease, or unspecified chronic kidney disease: Secondary | ICD-10-CM | POA: Diagnosis present

## 2016-12-17 DIAGNOSIS — K828 Other specified diseases of gallbladder: Secondary | ICD-10-CM | POA: Diagnosis present

## 2016-12-17 DIAGNOSIS — Z7984 Long term (current) use of oral hypoglycemic drugs: Secondary | ICD-10-CM

## 2016-12-17 DIAGNOSIS — K56609 Unspecified intestinal obstruction, unspecified as to partial versus complete obstruction: Secondary | ICD-10-CM | POA: Diagnosis present

## 2016-12-17 DIAGNOSIS — K55021 Focal (segmental) acute infarction of small intestine: Secondary | ICD-10-CM | POA: Diagnosis present

## 2016-12-17 DIAGNOSIS — R402441 Other coma, without documented Glasgow coma scale score, or with partial score reported, in the field [EMT or ambulance]: Secondary | ICD-10-CM | POA: Diagnosis not present

## 2016-12-17 DIAGNOSIS — D509 Iron deficiency anemia, unspecified: Secondary | ICD-10-CM | POA: Diagnosis not present

## 2016-12-17 DIAGNOSIS — E876 Hypokalemia: Secondary | ICD-10-CM | POA: Diagnosis not present

## 2016-12-17 DIAGNOSIS — E119 Type 2 diabetes mellitus without complications: Secondary | ICD-10-CM

## 2016-12-17 DIAGNOSIS — N289 Disorder of kidney and ureter, unspecified: Secondary | ICD-10-CM | POA: Diagnosis not present

## 2016-12-17 DIAGNOSIS — K5652 Intestinal adhesions [bands] with complete obstruction: Principal | ICD-10-CM | POA: Diagnosis present

## 2016-12-17 DIAGNOSIS — E7800 Pure hypercholesterolemia, unspecified: Secondary | ICD-10-CM | POA: Diagnosis present

## 2016-12-17 DIAGNOSIS — D72829 Elevated white blood cell count, unspecified: Secondary | ICD-10-CM | POA: Diagnosis present

## 2016-12-17 DIAGNOSIS — D631 Anemia in chronic kidney disease: Secondary | ICD-10-CM | POA: Diagnosis present

## 2016-12-17 DIAGNOSIS — I700 Atherosclerosis of aorta: Secondary | ICD-10-CM | POA: Diagnosis not present

## 2016-12-17 DIAGNOSIS — F05 Delirium due to known physiological condition: Secondary | ICD-10-CM | POA: Diagnosis present

## 2016-12-17 DIAGNOSIS — J9811 Atelectasis: Secondary | ICD-10-CM | POA: Diagnosis not present

## 2016-12-17 DIAGNOSIS — E1122 Type 2 diabetes mellitus with diabetic chronic kidney disease: Secondary | ICD-10-CM | POA: Diagnosis present

## 2016-12-17 DIAGNOSIS — E870 Hyperosmolality and hypernatremia: Secondary | ICD-10-CM | POA: Diagnosis not present

## 2016-12-17 DIAGNOSIS — Z888 Allergy status to other drugs, medicaments and biological substances status: Secondary | ICD-10-CM

## 2016-12-17 DIAGNOSIS — Z90710 Acquired absence of both cervix and uterus: Secondary | ICD-10-CM

## 2016-12-17 DIAGNOSIS — R410 Disorientation, unspecified: Secondary | ICD-10-CM

## 2016-12-17 DIAGNOSIS — E1151 Type 2 diabetes mellitus with diabetic peripheral angiopathy without gangrene: Secondary | ICD-10-CM | POA: Diagnosis present

## 2016-12-17 DIAGNOSIS — Z951 Presence of aortocoronary bypass graft: Secondary | ICD-10-CM

## 2016-12-17 DIAGNOSIS — I447 Left bundle-branch block, unspecified: Secondary | ICD-10-CM | POA: Diagnosis present

## 2016-12-17 DIAGNOSIS — N179 Acute kidney failure, unspecified: Secondary | ICD-10-CM | POA: Diagnosis not present

## 2016-12-17 DIAGNOSIS — Z0189 Encounter for other specified special examinations: Secondary | ICD-10-CM

## 2016-12-17 DIAGNOSIS — R188 Other ascites: Secondary | ICD-10-CM | POA: Diagnosis not present

## 2016-12-17 DIAGNOSIS — I2510 Atherosclerotic heart disease of native coronary artery without angina pectoris: Secondary | ICD-10-CM | POA: Diagnosis present

## 2016-12-17 DIAGNOSIS — R4182 Altered mental status, unspecified: Secondary | ICD-10-CM | POA: Diagnosis not present

## 2016-12-17 DIAGNOSIS — R0989 Other specified symptoms and signs involving the circulatory and respiratory systems: Secondary | ICD-10-CM

## 2016-12-17 DIAGNOSIS — R1033 Periumbilical pain: Secondary | ICD-10-CM | POA: Diagnosis not present

## 2016-12-17 DIAGNOSIS — N183 Chronic kidney disease, stage 3 (moderate): Secondary | ICD-10-CM | POA: Diagnosis present

## 2016-12-17 DIAGNOSIS — E869 Volume depletion, unspecified: Secondary | ICD-10-CM | POA: Diagnosis not present

## 2016-12-17 DIAGNOSIS — Z79899 Other long term (current) drug therapy: Secondary | ICD-10-CM

## 2016-12-17 DIAGNOSIS — Z7982 Long term (current) use of aspirin: Secondary | ICD-10-CM

## 2016-12-17 DIAGNOSIS — I10 Essential (primary) hypertension: Secondary | ICD-10-CM | POA: Diagnosis present

## 2016-12-17 DIAGNOSIS — F4489 Other dissociative and conversion disorders: Secondary | ICD-10-CM | POA: Diagnosis not present

## 2016-12-17 DIAGNOSIS — Y838 Other surgical procedures as the cause of abnormal reaction of the patient, or of later complication, without mention of misadventure at the time of the procedure: Secondary | ICD-10-CM | POA: Diagnosis present

## 2016-12-17 DIAGNOSIS — I252 Old myocardial infarction: Secondary | ICD-10-CM

## 2016-12-17 HISTORY — DX: Atherosclerotic heart disease of native coronary artery without angina pectoris: I25.10

## 2016-12-17 HISTORY — DX: Unspecified intestinal obstruction, unspecified as to partial versus complete obstruction: K56.609

## 2016-12-17 LAB — COMPREHENSIVE METABOLIC PANEL
ALBUMIN: 3.6 g/dL (ref 3.5–5.0)
ALT: 9 U/L — ABNORMAL LOW (ref 14–54)
ANION GAP: 12 (ref 5–15)
AST: 16 U/L (ref 15–41)
Alkaline Phosphatase: 52 U/L (ref 38–126)
BUN: 19 mg/dL (ref 6–20)
CO2: 24 mmol/L (ref 22–32)
Calcium: 9 mg/dL (ref 8.9–10.3)
Chloride: 104 mmol/L (ref 101–111)
Creatinine, Ser: 1.28 mg/dL — ABNORMAL HIGH (ref 0.44–1.00)
GFR calc Af Amer: 42 mL/min — ABNORMAL LOW (ref >60)
GFR calc non Af Amer: 36 mL/min — ABNORMAL LOW (ref >60)
GLUCOSE: 160 mg/dL — AB (ref 65–99)
Potassium: 4.2 mmol/L (ref 3.5–5.1)
Sodium: 140 mmol/L (ref 135–145)
TOTAL PROTEIN: 6.2 g/dL — AB (ref 6.5–8.1)
Total Bilirubin: 0.9 mg/dL (ref 0.3–1.2)

## 2016-12-17 LAB — DIFFERENTIAL
BASOS ABS: 0 10*3/uL (ref 0.0–0.1)
Basophils Relative: 0 %
Eosinophils Absolute: 0 10*3/uL (ref 0.0–0.7)
Eosinophils Relative: 0 %
Lymphocytes Relative: 6 %
Lymphs Abs: 1.1 10*3/uL (ref 0.7–4.0)
Monocytes Absolute: 2.3 10*3/uL — ABNORMAL HIGH (ref 0.1–1.0)
Monocytes Relative: 12 %
Neutro Abs: 16.2 10*3/uL — ABNORMAL HIGH (ref 1.7–7.7)
Neutrophils Relative %: 83 %

## 2016-12-17 LAB — CBC
HCT: 29.2 % — ABNORMAL LOW (ref 36.0–46.0)
Hemoglobin: 9.4 g/dL — ABNORMAL LOW (ref 12.0–15.0)
MCH: 24 pg — ABNORMAL LOW (ref 26.0–34.0)
MCHC: 32.2 g/dL (ref 30.0–36.0)
MCV: 74.5 fL — ABNORMAL LOW (ref 78.0–100.0)
Platelets: 167 10*3/uL (ref 150–400)
RBC: 3.92 MIL/uL (ref 3.87–5.11)
RDW: 16.4 % — ABNORMAL HIGH (ref 11.5–15.5)
WBC: 19.6 10*3/uL — ABNORMAL HIGH (ref 4.0–10.5)

## 2016-12-17 LAB — I-STAT CG4 LACTIC ACID, ED: Lactic Acid, Venous: 1.56 mmol/L (ref 0.5–1.9)

## 2016-12-17 LAB — LIPASE, BLOOD: Lipase: 12 U/L (ref 11–51)

## 2016-12-17 LAB — CBG MONITORING, ED: Glucose-Capillary: 149 mg/dL — ABNORMAL HIGH (ref 65–99)

## 2016-12-17 NOTE — ED Triage Notes (Signed)
Per EMS, pt from home with confusion to time and asking repetitive questions. No last known normal, per ems and family, confusion began this afternoon. No other neuro deficits noted. Pt has no complaints. BP-168/76, HR-78, SpO2-98% 2L [place], CBG-148

## 2016-12-17 NOTE — ED Provider Notes (Signed)
[facility]-EMERGENCY DEPT Provider Note   CSN: [id number] Arrival date & time: 12/17/16  2235     History   Chief Complaint Chief Complaint  Patient presents with  . Altered Mental Status    HPI [place] Sonya Chavez is a 83 y.o. female.  The history is provided by the patient and the EMS personnel. The history is limited by the condition of the patient (Altered mental status).  She was brought in by ambulance from home where she was noted to be confused since this afternoon. Patient states that she is here because her family wanted her here. She denies any pain or fever or chills or cough or vomiting or diarrhea.  Past Medical History:  Diagnosis Date  . Arthritis    "qwhere"  . Hypercholesteremia   . Hypertension   . Myocardial infarction (HCC) 2003  . Stenosis of subclavian artery (HCC)   . Type II diabetes mellitus [facility])     Patient Active Problem List   Diagnosis Date Noted  . Peripheral artery disease (HCC) 11/08/2014  . PAD (peripheral artery disease) (HCC) 10/24/2014  . LBBB (left bundle branch block) 09/27/2014    Class: Chronic  . Subclavian artery stenosis, left (HCC) 09/27/2014  . Numbness and tingling in left hand 03/08/2013  . HTN (hypertension) 03/08/2013  . DM (diabetes mellitus) (HCC) 03/08/2013  . CAD (coronary artery disease) 03/08/2013    Past Surgical History:  Procedure Laterality Date  . ABDOMINAL HYSTERECTOMY  1970  . CARDIAC CATHETERIZATION    . CATARACT EXTRACTION Bilateral 2000's  . CORONARY ARTERY BYPASS GRAFT  2003   in [place], [place]  . LEFT HEART CATHETERIZATION WITH CORONARY/GRAFT ANGIOGRAM N/A 09/27/2014   Procedure: LEFT HEART CATHETERIZATION WITH Isabel Caprice;  Surgeon: Pamella Pert, MD;  Location: [facility] C[facility] LAB;  Service: Cardiovascular;  Laterality: N/A;  . UNILATERAL UPPER EXTREMEITY ANGIOGRAM N/A 09/27/2014   Procedure: SUBCLAVIAN ARTERIOGRAM ;  Surgeon: Pamella Pert, MD;  Location: [facility] CATH LAB;  Service:  Cardiovascular;  Laterality: N/A;  . UNILATERAL UPPER EXTREMEITY ANGIOGRAM N/A 11/09/2014   Procedure: Left subclavian stent ;  Surgeon: Yates Decamp, MD;  Location: [facility] CATH LAB;  Service: Cardiovascular;  Laterality: N/A;  . VISCERAL ANGIOGRAM N/A 10/25/2014   Procedure: Laurence Slate;  Surgeon: Yates Decamp, MD;  Location: [facility] CATH LAB;  Service: Cardiovascular;  Laterality: N/A;    OB History    No data available       Home Medications    Prior to Admission medications   Medication Sig Start Date End Date Taking? Authorizing Provider  acetaminophen (TYLENOL) 325 MG tablet Take 650 mg by mouth daily as needed (pain).    [provider]  alendronate (FOSAMAX) 70 MG tablet Take 70 mg by mouth every Monday.  12/08/14   [provider]  amLODipine (NORVASC) 10 MG tablet Take 10 mg by mouth at bedtime.  10/12/14   [provider]  aspirin EC 81 MG tablet Take 81 mg by mouth at bedtime.     [provider]  Calcium Citrate-Vitamin D (CALCIUM CITRATE + PO) Take 4 capsules by mouth daily.    [provider]  carvedilol (COREG) 6.25 MG tablet Take 6.25 mg by mouth 2 (two) times daily with a meal.    [provider]  cephALEXin (KEFLEX) 500 MG capsule Take 1 capsule (500 mg total) by mouth 4 (four) times daily. 10/22/15   Renne Crigler, PA-C  chlorthalidone (HYGROTON) 25 MG tablet Take 1 tablet (  25 mg total) by mouth daily. 09/29/14   Yates Decamp, MD  Cholecalciferol (VITAMIN D3) 2000 UNITS capsule Take 2,000 Units by mouth daily.    [provider]  clopidogrel (PLAVIX) 75 MG tablet Take 1 tablet (75 mg total) by mouth daily with breakfast. 03/09/13   Myra Rude, MD  famotidine (PEPCID) 40 MG tablet Take 40 mg by mouth daily. 11/23/14   [provider]  gabapentin (NEURONTIN) 100 MG capsule Take 200 mg by mouth at bedtime.    [provider]  glimepiride (AMARYL) 2 MG tablet Take 2 mg by mouth daily with breakfast.     [provider]  glimepiride (AMARYL) 2 MG tablet Take 2 mg by mouth daily. 30 minutes before breakfast. 08/23/14 08/23/15  [provider]  losartan (COZAAR) 100 MG tablet Take 100 mg by mouth daily. 12/08/14   [provider]  lovastatin (MEVACOR) 40 MG tablet Take 40 mg by mouth at bedtime.    [provider]  metFORMIN (GLUCOPHAGE) 1000 MG tablet Take 1 tablet (1,000 mg total) by mouth 2 (two) times daily with a meal. 11/11/14   Yates Decamp, MD    Family History No family history on file.  Social History Social History  Substance Use Topics  . Smoking status: Never Smoker  . Smokeless tobacco: Never Used  . Alcohol use No     Allergies   Crestor [rosuvastatin]; Lipitor [atorvastatin]; and Zocor [simvastatin]   Review of Systems Review of Systems  Unable to perform ROS: Mental status change     Physical Exam Updated Vital Signs BP (!) 172/63 (BP Location: Right Arm)   Pulse 89   Temp 99.4 F (37.4 C) (Oral)   Resp (!) 32   SpO2 90%   Physical Exam  Nursing note and vitals reviewed.  83 year old female, resting comfortably and in no acute distress. Vital signs are significant for hypertension and tachypnea. Oxygen saturation is 90%, which is hypoxic per my interpretation. Head is normocephalic and atraumatic. PERRLA, EOMI. Oropharynx is clear. Neck is nontender and supple without adenopathy or JVD. Back is nontender and there is no CVA tenderness. Lungs have by Viscoat rales which are louder on the left. There are no wheezes or rhonchi. Chest is nontender. Heart has regular rate and rhythm without murmur. Abdomen is soft, flat, with moderate tenderness in the right upper quadrant and right lower quadrant. There is no rebound or guarding. There are no masses or hepatosplenomegaly and peristalsis is normoactive. Extremities have no cyanosis or edema, full range of motion is present. Skin is warm and dry without rash. Neurologic: She is  awake and alert and oriented to person and place but not time, cranial nerves are intact, there are no motor or sensory deficits. She does exhibit some perseveration of speech.  ED Treatments / Results  Labs (all labs ordered are listed, but only abnormal results are displayed) Labs Reviewed  COMPREHENSIVE METABOLIC PANEL - Abnormal; Notable for the following:       Result Value   Glucose, Bld 160 (*)    Creatinine, Ser 1.28 (*)    Total Protein 6.2 (*)    ALT 9 (*)    GFR calc non Af Amer 36 (*)    GFR calc Af Amer 42 (*)    All other components within normal limits  CBC - Abnormal; Notable for the following:    WBC 19.6 (*)    Hemoglobin 9.4 (*)    HCT 29.2 (*)  MCV 74.5 (*)    MCH 24.0 (*)    RDW 16.4 (*)    All other components within normal limits  DIFFERENTIAL - Abnormal; Notable for the following:    Neutro Abs 16.2 (*)    Monocytes Absolute 2.3 (*)    All other components within normal limits  URINALYSIS, ROUTINE W REFLEX MICROSCOPIC - Abnormal; Notable for the following:    APPearance HAZY (*)    Hgb urine dipstick SMALL (*)    Protein, ur 100 (*)    Squamous Epithelial / LPF 0-5 (*)    All other components within normal limits  CBG MONITORING, ED - Abnormal; Notable for the following:    Glucose-Capillary 149 (*)    All other components within normal limits  CBG MONITORING, ED - Abnormal; Notable for the following:    Glucose-Capillary 146 (*)    All other components within normal limits  LIPASE, BLOOD  I-STAT CG4 LACTIC ACID, ED     Radiology Dg Chest 2 View  Result Date: 12/18/2016 CLINICAL DATA:  Altered mental status. EXAM: CHEST  2 VIEW COMPARISON:  Radiographs 07/04/2015 FINDINGS: Post median sternotomy and CABG. Low lung volumes. Unchanged heart size and mediastinal contours with atherosclerosis of the aortic arch. Vascular stent projects over the superior mediastinum. No pulmonary edema. Mild bibasilar atelectasis. No confluent airspace disease.  Possible trace left pleural effusion versus scarring. No pneumothorax. Degenerative change in the spine. IMPRESSION: Low lung volumes with bibasilar atelectasis. Post CABG with normal heart size with thoracic aortic atherosclerosis. Electronically Signed   By: Rubye OaksMelanie  Ehinger M.D.   On: 12/18/2016 00:00   Ct Head Wo Contrast  Result Date: 12/18/2016 CLINICAL DATA:  Confusion EXAM: CT HEAD WITHOUT CONTRAST TECHNIQUE: Contiguous axial images were obtained from the base of the skull through the vertex without intravenous contrast. COMPARISON:  Brain MRI 03/09/2013 FINDINGS: Brain: No mass lesion, intraparenchymal hemorrhage or extra-axial collection. No evidence of acute cortical infarct. There is periventricular hypoattenuation compatible with chronic microvascular disease. Vascular: No hyperdense vessel or unexpected calcification. Skull: Normal visualized skull base, calvarium and extracranial soft tissues. Sinuses/Orbits: No sinus fluid levels or advanced mucosal thickening. No mastoid effusion. Normal orbits. IMPRESSION: Chronic microvascular ischemia without acute intracranial abnormality. Electronically Signed   By: Deatra RobinsonKevin  Herman M.D.   On: 12/18/2016 01:19   Ct Abdomen Pelvis W Contrast  Result Date: 12/18/2016 CLINICAL DATA:  Periumbilical pain after eating for the past several days. History hysterectomy. EXAM: CT ABDOMEN AND PELVIS WITH CONTRAST TECHNIQUE: Multidetector CT imaging of the abdomen and pelvis was performed using the standard protocol following bolus administration of intravenous contrast. CONTRAST:  75mL ISOVUE-300 IOPAMIDOL (ISOVUE-300) INJECTION 61% COMPARISON:  None. FINDINGS: Lower chest: Trace bilateral pleural effusions. Borderline cardiomegaly with aortic atherosclerosis and coronary arteriosclerosis. The patient is status post CABG. No pericardial effusion. Hepatobiliary: Mural calcification of the gallbladder consistent with porcelain gallbladder. Several large calcified  gallstones are seen within. The gallbladder is distended and there is pericholecystic fluid which is more likely related to the current small bowel obstruction as opposed to acute cholecystitis. No biliary dilatation or hepatic mass. Pancreas: Atrophic pancreas without ductal dilatation. No choledocholithiasis. Spleen: Normal size spleen with a few small indeterminate 6 mm hypodensities potentially representing tiny cysts or hemangiomata. There appears to be a small splenule adjacent to the spleen. Adrenals/Urinary Tract: Normal bilateral adrenal glands. Mild cortical thinning of both kidneys without obstructive uropathy. Indeterminate 1.5 cm left upper pole hypodense cystic appearing lesion. Smaller hypodensities are seen  in the lower pole of the left kidney. No nephrolithiasis. The urinary bladder is physiologically distended. Stomach/Bowel: The stomach is contracted. There is normal small bowel rotation. Abnormal fluid-filled dilated loops of jejunum and proximal to mid ileum up to 3.2 cm in diameter are identified with mild-to-moderate segmental thickening of distal ileum possibly representing an inflammatory stricture from inflammatory bowel disease. The exact point of transition is not definitively identified. Large bowel is nondistended and there is extensive descending and sigmoid diverticulosis with circular muscle hypertrophy noted. Vascular/Lymphatic: Moderate aorto bi-iliac and branch vessel atherosclerosis. Reproductive: Status post hysterectomy. No adnexal masses. Other: Small amount of free fluid is present within the abdomen pelvis. No free air. Coarse ventral subcutaneous pelvic calcification possibly from prior hysterectomy is noted. Musculoskeletal: Grade 1 anterolisthesis of L4 on L5 with mild disc space narrowing. Moderate disc space narrowing at L5-S1. No acute osseous abnormality. IMPRESSION: 1. Acute high-grade small bowel obstruction with long segmental thickening of the distal ileum  possibly representing an inflammatory stricture or changes from inflammatory bowel disease contributing to the small bowel obstruction. The exact point of transition is not definitively identified. 2. 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 Pericholecystic fluid is more likely secondary to the small bowel obstruction and less likely due to acute cholecystitis. 3. Indeterminate left upper pole 1.5 cm cystic lesion of the kidney possibly complex cyst. Smaller hypodensities also statistically consistent with cysts but too small to further characterize are noted in the lower pole. 4. Aortoiliac atherosclerosis without aneurysm. Coronary arteriosclerosis. 5. Lower lumbar degenerative disc disease at L4-5 and L5-S1 with grade 1 anterolisthesis of L4 on L5. Electronically Signed   By: Tollie Eth M.D.   On: 12/18/2016 01:30    Procedures Procedures (including critical care time)  Medications Ordered in ED Medications  0.9 %  sodium chloride infusion (not administered)  insulin aspart (novoLOG) injection 0-9 Units (not administered)  iopamidol (ISOVUE-300) 61 % injection (75 mLs  Contrast Given 12/18/16 0051)     Initial Impression / Assessment and Plan / ED Course  I have reviewed the triage vital signs and the nursing notes.  Pertinent labs & imaging results that were available during my care of the patient were reviewed by me and considered in my medical decision making (see chart for details).  Altered mental status with tachypnea and hypoxia worrisome for pneumonia. Abdominal tenderness of uncertain cause. Old records are reviewed, and there are no relevant past visits. Screening labs are obtained as well as chest x-ray.  X-ray shows no definite pneumonia. WBC is markedly elevated at 19.6 with left shift. Creatinine is mildly elevated and unchanged from baseline. Moderate anemia with hemoglobin 9.4 which is unchanged from baseline. Lactate is normal. Since there is no  sign of pneumonia, will proceed with CT of abdomen and pelvis to see if her abdominal tenderness is relating to her mental status change and abdominal tenderness. Will also check CT of head.  Family has arrived. They noticed that she is been sleeping a lot for the last several days and has been repeating herself. They were not aware of any other changes. She been eating normally and had not had any obvious fever or cough.  CT scan shows evidence of high-grade small bowel obstruction. Incidental finding of porcelain gallbladder. This would account for her abdominal pain, and probably her confusion. Case is discussed with Dr. Julian Reil of [facility] who agrees to admit the patient. Case is discussed with Dr. Corliss Skains of general surgery service who  states he will see the patient in consultation.  Final Clinical Impressions(s) / ED Diagnoses   Final diagnoses:  Small bowel obstruction (HCC)  Porcelain gallbladder  Confusion  Renal insufficiency  Microcytic anemia    New Prescriptions New Prescriptions   No medications on file     Dione Booze, MD 12/18/16 [phone]

## 2016-12-18 ENCOUNTER — Inpatient Hospital Stay (HOSPITAL_COMMUNITY): Payer: Medicare Other

## 2016-12-18 ENCOUNTER — Encounter (HOSPITAL_COMMUNITY): Payer: Self-pay | Admitting: Internal Medicine

## 2016-12-18 ENCOUNTER — Emergency Department (HOSPITAL_COMMUNITY): Payer: Medicare Other

## 2016-12-18 DIAGNOSIS — E869 Volume depletion, unspecified: Secondary | ICD-10-CM | POA: Diagnosis not present

## 2016-12-18 DIAGNOSIS — Y838 Other surgical procedures as the cause of abnormal reaction of the patient, or of later complication, without mention of misadventure at the time of the procedure: Secondary | ICD-10-CM | POA: Diagnosis present

## 2016-12-18 DIAGNOSIS — I10 Essential (primary) hypertension: Secondary | ICD-10-CM | POA: Diagnosis not present

## 2016-12-18 DIAGNOSIS — E870 Hyperosmolality and hypernatremia: Secondary | ICD-10-CM | POA: Diagnosis not present

## 2016-12-18 DIAGNOSIS — D509 Iron deficiency anemia, unspecified: Secondary | ICD-10-CM | POA: Diagnosis present

## 2016-12-18 DIAGNOSIS — K5652 Intestinal adhesions [bands] with complete obstruction: Secondary | ICD-10-CM | POA: Diagnosis present

## 2016-12-18 DIAGNOSIS — N179 Acute kidney failure, unspecified: Secondary | ICD-10-CM | POA: Diagnosis not present

## 2016-12-18 DIAGNOSIS — S30851A Superficial foreign body of abdominal wall, initial encounter: Secondary | ICD-10-CM | POA: Diagnosis not present

## 2016-12-18 DIAGNOSIS — E119 Type 2 diabetes mellitus without complications: Secondary | ICD-10-CM

## 2016-12-18 DIAGNOSIS — N289 Disorder of kidney and ureter, unspecified: Secondary | ICD-10-CM | POA: Diagnosis not present

## 2016-12-18 DIAGNOSIS — F05 Delirium due to known physiological condition: Secondary | ICD-10-CM | POA: Diagnosis present

## 2016-12-18 DIAGNOSIS — Z951 Presence of aortocoronary bypass graft: Secondary | ICD-10-CM | POA: Diagnosis not present

## 2016-12-18 DIAGNOSIS — I700 Atherosclerosis of aorta: Secondary | ICD-10-CM | POA: Diagnosis not present

## 2016-12-18 DIAGNOSIS — K56609 Unspecified intestinal obstruction, unspecified as to partial versus complete obstruction: Secondary | ICD-10-CM

## 2016-12-18 DIAGNOSIS — K55019 Acute (reversible) ischemia of small intestine, extent unspecified: Secondary | ICD-10-CM | POA: Diagnosis not present

## 2016-12-18 DIAGNOSIS — I252 Old myocardial infarction: Secondary | ICD-10-CM | POA: Diagnosis not present

## 2016-12-18 DIAGNOSIS — I447 Left bundle-branch block, unspecified: Secondary | ICD-10-CM | POA: Diagnosis present

## 2016-12-18 DIAGNOSIS — Z4682 Encounter for fitting and adjustment of non-vascular catheter: Secondary | ICD-10-CM | POA: Diagnosis not present

## 2016-12-18 DIAGNOSIS — E1122 Type 2 diabetes mellitus with diabetic chronic kidney disease: Secondary | ICD-10-CM | POA: Diagnosis present

## 2016-12-18 DIAGNOSIS — D72829 Elevated white blood cell count, unspecified: Secondary | ICD-10-CM | POA: Diagnosis not present

## 2016-12-18 DIAGNOSIS — Z7984 Long term (current) use of oral hypoglycemic drugs: Secondary | ICD-10-CM | POA: Diagnosis not present

## 2016-12-18 DIAGNOSIS — E7800 Pure hypercholesterolemia, unspecified: Secondary | ICD-10-CM | POA: Diagnosis present

## 2016-12-18 DIAGNOSIS — Z7982 Long term (current) use of aspirin: Secondary | ICD-10-CM | POA: Diagnosis not present

## 2016-12-18 DIAGNOSIS — K5669 Other partial intestinal obstruction: Secondary | ICD-10-CM | POA: Diagnosis not present

## 2016-12-18 DIAGNOSIS — I129 Hypertensive chronic kidney disease with stage 1 through stage 4 chronic kidney disease, or unspecified chronic kidney disease: Secondary | ICD-10-CM | POA: Diagnosis present

## 2016-12-18 DIAGNOSIS — E876 Hypokalemia: Secondary | ICD-10-CM | POA: Diagnosis not present

## 2016-12-18 DIAGNOSIS — R410 Disorientation, unspecified: Secondary | ICD-10-CM

## 2016-12-18 DIAGNOSIS — E1129 Type 2 diabetes mellitus with other diabetic kidney complication: Secondary | ICD-10-CM | POA: Diagnosis not present

## 2016-12-18 DIAGNOSIS — Z452 Encounter for adjustment and management of vascular access device: Secondary | ICD-10-CM | POA: Diagnosis not present

## 2016-12-18 DIAGNOSIS — R4182 Altered mental status, unspecified: Secondary | ICD-10-CM | POA: Diagnosis not present

## 2016-12-18 DIAGNOSIS — Z90710 Acquired absence of both cervix and uterus: Secondary | ICD-10-CM | POA: Diagnosis not present

## 2016-12-18 DIAGNOSIS — K559 Vascular disorder of intestine, unspecified: Secondary | ICD-10-CM | POA: Diagnosis not present

## 2016-12-18 DIAGNOSIS — I2510 Atherosclerotic heart disease of native coronary artery without angina pectoris: Secondary | ICD-10-CM

## 2016-12-18 DIAGNOSIS — D631 Anemia in chronic kidney disease: Secondary | ICD-10-CM | POA: Diagnosis present

## 2016-12-18 DIAGNOSIS — K8020 Calculus of gallbladder without cholecystitis without obstruction: Secondary | ICD-10-CM | POA: Diagnosis not present

## 2016-12-18 DIAGNOSIS — N183 Chronic kidney disease, stage 3 (moderate): Secondary | ICD-10-CM | POA: Diagnosis present

## 2016-12-18 DIAGNOSIS — R1033 Periumbilical pain: Secondary | ICD-10-CM | POA: Diagnosis not present

## 2016-12-18 DIAGNOSIS — J9811 Atelectasis: Secondary | ICD-10-CM | POA: Diagnosis not present

## 2016-12-18 DIAGNOSIS — K55069 Acute infarction of intestine, part and extent unspecified: Secondary | ICD-10-CM | POA: Diagnosis not present

## 2016-12-18 DIAGNOSIS — K828 Other specified diseases of gallbladder: Secondary | ICD-10-CM | POA: Diagnosis not present

## 2016-12-18 DIAGNOSIS — R188 Other ascites: Secondary | ICD-10-CM | POA: Diagnosis present

## 2016-12-18 DIAGNOSIS — E1151 Type 2 diabetes mellitus with diabetic peripheral angiopathy without gangrene: Secondary | ICD-10-CM | POA: Diagnosis present

## 2016-12-18 DIAGNOSIS — K55021 Focal (segmental) acute infarction of small intestine: Secondary | ICD-10-CM | POA: Diagnosis present

## 2016-12-18 HISTORY — DX: Unspecified intestinal obstruction, unspecified as to partial versus complete obstruction: K56.609

## 2016-12-18 LAB — CBC
HEMATOCRIT: 31 % — AB (ref 36.0–46.0)
HEMOGLOBIN: 9.7 g/dL — AB (ref 12.0–15.0)
MCH: 23.5 pg — AB (ref 26.0–34.0)
MCHC: 31.3 g/dL (ref 30.0–36.0)
MCV: 75.2 fL — AB (ref 78.0–100.0)
Platelets: 159 10*3/uL (ref 150–400)
RBC: 4.12 MIL/uL (ref 3.87–5.11)
RDW: 16.7 % — ABNORMAL HIGH (ref 11.5–15.5)
WBC: 19.8 10*3/uL — ABNORMAL HIGH (ref 4.0–10.5)

## 2016-12-18 LAB — GLUCOSE, CAPILLARY
GLUCOSE-CAPILLARY: 171 mg/dL — AB (ref 65–99)
GLUCOSE-CAPILLARY: 111 mg/dL — AB (ref 65–99)
GLUCOSE-CAPILLARY: 111 mg/dL — AB (ref 65–99)
Glucose-Capillary: 123 mg/dL — ABNORMAL HIGH (ref 65–99)
Glucose-Capillary: 152 mg/dL — ABNORMAL HIGH (ref 65–99)

## 2016-12-18 LAB — URINALYSIS, ROUTINE W REFLEX MICROSCOPIC
BACTERIA UA: NONE SEEN
Bilirubin Urine: NEGATIVE
Glucose, UA: NEGATIVE mg/dL
Ketones, ur: NEGATIVE mg/dL
Leukocytes, UA: NEGATIVE
Nitrite: NEGATIVE
Protein, ur: 100 mg/dL — AB
SPECIFIC GRAVITY, URINE: 1.016 (ref 1.005–1.030)
pH: 5 (ref 5.0–8.0)

## 2016-12-18 LAB — BASIC METABOLIC PANEL
Anion gap: 12 (ref 5–15)
BUN: 24 mg/dL — AB (ref 6–20)
CHLORIDE: 104 mmol/L (ref 101–111)
CO2: 21 mmol/L — AB (ref 22–32)
Calcium: 8.5 mg/dL — ABNORMAL LOW (ref 8.9–10.3)
Creatinine, Ser: 1.3 mg/dL — ABNORMAL HIGH (ref 0.44–1.00)
GFR calc Af Amer: 41 mL/min — ABNORMAL LOW (ref >60)
GFR calc non Af Amer: 35 mL/min — ABNORMAL LOW (ref >60)
GLUCOSE: 121 mg/dL — AB (ref 65–99)
POTASSIUM: 3.7 mmol/L (ref 3.5–5.1)
Sodium: 137 mmol/L (ref 135–145)

## 2016-12-18 LAB — CBG MONITORING, ED: Glucose-Capillary: 146 mg/dL — ABNORMAL HIGH (ref 65–99)

## 2016-12-18 MED ORDER — IOPAMIDOL (ISOVUE-300) INJECTION 61%
INTRAVENOUS | Status: AC
  Administered 2016-12-18: 75 mL
  Filled 2016-12-18: qty 100

## 2016-12-18 MED ORDER — SODIUM CHLORIDE 0.9 % IV SOLN
INTRAVENOUS | Status: DC
  Administered 2016-12-18 – 2016-12-22 (×7): via INTRAVENOUS

## 2016-12-18 MED ORDER — FUROSEMIDE 10 MG/ML IJ SOLN
20.0000 mg | Freq: Once | INTRAMUSCULAR | Status: AC
  Administered 2016-12-18: 20 mg via INTRAVENOUS
  Filled 2016-12-18: qty 2

## 2016-12-18 MED ORDER — ENOXAPARIN SODIUM 40 MG/0.4ML ~~LOC~~ SOLN
40.0000 mg | Freq: Every day | SUBCUTANEOUS | Status: DC
  Administered 2016-12-18 – 2016-12-19 (×2): 40 mg via SUBCUTANEOUS
  Filled 2016-12-18 (×2): qty 0.4

## 2016-12-18 MED ORDER — DIATRIZOATE MEGLUMINE & SODIUM 66-10 % PO SOLN
90.0000 mL | Freq: Once | ORAL | Status: AC
  Administered 2016-12-18: 90 mL via NASOGASTRIC
  Filled 2016-12-18: qty 90

## 2016-12-18 MED ORDER — GABAPENTIN 100 MG PO CAPS
200.0000 mg | ORAL_CAPSULE | Freq: Every day | ORAL | Status: DC
  Administered 2016-12-18 – 2016-12-24 (×4): 200 mg via ORAL
  Filled 2016-12-18 (×4): qty 2

## 2016-12-18 MED ORDER — IPRATROPIUM-ALBUTEROL 0.5-2.5 (3) MG/3ML IN SOLN: 3.0000 mL | Freq: Four times a day (QID) | RESPIRATORY_TRACT | Status: DC | PRN

## 2016-12-18 MED ORDER — ONDANSETRON HCL 4 MG/2ML IJ SOLN: 4.0000 mg | Freq: Four times a day (QID) | INTRAMUSCULAR | Status: DC | PRN

## 2016-12-18 MED ORDER — MORPHINE SULFATE (PF) 4 MG/ML IV SOLN
1.0000 mg | INTRAVENOUS | Status: DC | PRN
  Administered 2016-12-18: 1 mg via INTRAVENOUS
  Filled 2016-12-18: qty 1

## 2016-12-18 MED ORDER — LOSARTAN POTASSIUM 50 MG PO TABS
100.0000 mg | ORAL_TABLET | Freq: Every day | ORAL | Status: DC
  Administered 2016-12-18 – 2016-12-19 (×2): 100 mg via ORAL
  Filled 2016-12-18 (×2): qty 2

## 2016-12-18 MED ORDER — ASPIRIN EC 81 MG PO TBEC
81.0000 mg | DELAYED_RELEASE_TABLET | Freq: Every day | ORAL | Status: DC
  Administered 2016-12-18 – 2016-12-20 (×3): 81 mg via ORAL
  Filled 2016-12-18 (×3): qty 1

## 2016-12-18 MED ORDER — AMLODIPINE BESYLATE 10 MG PO TABS
10.0000 mg | ORAL_TABLET | Freq: Every day | ORAL | Status: DC
  Administered 2016-12-18 – 2016-12-24 (×3): 10 mg via ORAL
  Filled 2016-12-18 (×4): qty 1

## 2016-12-18 MED ORDER — INSULIN ASPART 100 UNIT/ML ~~LOC~~ SOLN
0.0000 [IU] | SUBCUTANEOUS | Status: DC
  Administered 2016-12-18: 1 [IU] via SUBCUTANEOUS
  Administered 2016-12-18 (×2): 2 [IU] via SUBCUTANEOUS
  Administered 2016-12-19: 1 [IU] via SUBCUTANEOUS
  Administered 2016-12-19: 2 [IU] via SUBCUTANEOUS
  Administered 2016-12-19: 1 [IU] via SUBCUTANEOUS
  Administered 2016-12-19: 2 [IU] via SUBCUTANEOUS
  Administered 2016-12-20 – 2016-12-22 (×8): 1 [IU] via SUBCUTANEOUS
  Administered 2016-12-22 – 2016-12-23 (×6): 2 [IU] via SUBCUTANEOUS
  Administered 2016-12-23: 3 [IU] via SUBCUTANEOUS
  Administered 2016-12-24 (×2): 2 [IU] via SUBCUTANEOUS
  Administered 2016-12-24: 1 [IU] via SUBCUTANEOUS
  Administered 2016-12-24 – 2016-12-25 (×2): 2 [IU] via SUBCUTANEOUS
  Administered 2016-12-25: 1 [IU] via SUBCUTANEOUS
  Administered 2016-12-25: 2 [IU] via SUBCUTANEOUS
  Administered 2016-12-25: 1 [IU] via SUBCUTANEOUS

## 2016-12-18 NOTE — Progress Notes (Addendum)
Subjective: Patient admitted this morning, see detailed H&P by Dr Julian ReilGardner 83 y.o. female with medical history significant of DM2, MI.  Patient is brought in by family after having AMS this afternoon.  Patient was apparently confused and was asking questions repeatedly.  She denies any pain, fever, chills, cough.  When asked about vomiting says "not too much".  She denies abd pain, however family states that she was having abd pain around 4am yesterday when she was up trying to use bathroom.  She states she had a small BM at that time.  Per family members at bedside current mental status is actually close to baseline.  Patient was seen by general surgery and NG tube has been inserted. Complains of abdominal pain, also complete of shortness of breath    Vitals:   12/18/16 0349 12/18/16 1244  BP: (!) 164/67 (!) 156/69  Pulse: 95 97  Resp: 18 18  Temp: 98.9 F (37.2 C) 98.6 F (37 C)    On exam Chest- bibasilar crackles  Assessment ?Pulmonary edema Small bowel obstruction Abdominal pain Porecelain gallbladder Diabetes mellitus CAD  Plan Patient was given 1 dose of Lasix 20 g IV 1, not significant improvement in breathing. Still requiring 2 L of oxygen via nasal cannula. We'll restart IV normal saline at 75 ML per hour . Will start DuoNeb nebs every 6 hours when necessary  Start morphine 1 mg every 4 hours. for pain Patient has been evaluated by general surgery, NG tube inserted. General surgery not recommending treatment for porecelain gallbladder at this time     Meredeth IdeGagan S Leoma Folds [facility] Pager- [phone]

## 2016-12-18 NOTE — Consult Note (Signed)
[facility] Surgery Consult Note  [place] M Slider 10-16-1925  [id number].    Requesting MD: Roxanne Mins Chief Complaint/Reason for Consult: SBO and Porcelain Gallbladder  HPI:  Patient is an 83 y.o. Female who tells me her family brought her to the emergency room, because they "felt like she was sleeping too much". Per H&P, family felt that she had some altered mental status. She states they went out to eat on Sunday, and she developed abdominal pain the following day. She describes the pain in her lower abdomen as sharp in character, 10/10, intermittent, with no radiation to other areas. She did not know of anything that made the pain better or worse. She denies experiencing any fevers, chills, weakness, n/v, diarrhea. She currently denies being in any pain, n/v. Last BM was on Monday and was a soft stool. Patient not passing flatus. UOP has been good.   Recent Abx: None Blood thinners: 81 mg aspirin daily Abdominal Surgeries: Abdominal hysterectomy 1970  ROS: Review of Systems  Constitutional: Negative for chills, fever, malaise/fatigue and weight loss.  Respiratory: Negative for shortness of breath and wheezing.   Cardiovascular: Negative for chest pain and palpitations.  Gastrointestinal: Positive for abdominal pain. Negative for blood in stool, constipation, diarrhea, melena, nausea and vomiting.  Genitourinary: Negative for dysuria and flank pain.  Musculoskeletal: Negative for falls.  Neurological: Negative for dizziness, weakness and headaches.  All other systems reviewed and are negative.   Family History  Problem Relation Age of Onset  . Gallbladder disease Neg Hx     Past Medical History:  Diagnosis Date  . Arthritis    "qwhere"  . Hypercholesteremia   . Hypertension   . Myocardial infarction ([place]) 2003  . Stenosis of subclavian artery ([place])   . Type II diabetes mellitus ([place])     Past Surgical History:  Procedure Laterality Date  . ABDOMINAL HYSTERECTOMY   1970  . CARDIAC CATHETERIZATION    . CATARACT EXTRACTION Bilateral 2000's  . CORONARY ARTERY BYPASS GRAFT  2003   in [place], [place] CORONARY/GRAFT ANGIOGRAM N/A 09/27/2014   Procedure: LEFT HEART CATHETERIZATION WITH Beatrix Fetters;  Surgeon: Laverda Page, MD;  Location: [facility] CATH LAB;  Service: Cardiovascular;  Laterality: N/A;  . UNILATERAL UPPER EXTREMEITY ANGIOGRAM N/A 09/27/2014   Procedure: SUBCLAVIAN ARTERIOGRAM ;  Surgeon: Laverda Page, MD;  Location: [facility] CATH LAB;  Service: Cardiovascular;  Laterality: N/A;  . UNILATERAL UPPER EXTREMEITY ANGIOGRAM N/A 11/09/2014   Procedure: Left subclavian stent ;  Surgeon: Adrian Prows, MD;  Location: [facility] CATH LAB;  Service: Cardiovascular;  Laterality: N/A;  . VISCERAL ANGIOGRAM N/A 10/25/2014   Procedure: Blanchard Kelch;  Surgeon: Adrian Prows, MD;  Location: [facility] C[facility] LAB;  Service: Cardiovascular;  Laterality: N/A;    Social History:  reports that she has never smoked. She has never used smokeless tobacco. She reports that she does not drink alcohol or use drugs.  Allergies:  Allergies  Allergen Reactions  . Crestor [Rosuvastatin] Other (See Comments)    Myalgias  . Lipitor [Atorvastatin] Other (See Comments)    myalgias  . Zocor [Simvastatin] Other (See Comments)    myalgias    Medications Prior to Admission  Medication Sig Dispense Refill  . chlorthalidone (HYGROTON) 25 MG tablet Take 1 tablet (25 mg total) by mouth daily.    Marland Kitchen acetaminophen (TYLENOL) 325 MG tablet Take 650 mg by mouth daily as needed (pain).    Marland Kitchen alendronate (FOSAMAX) 70 MG  tablet Take 70 mg by mouth every Monday.     Marland Kitchen amLODipine (NORVASC) 10 MG tablet Take 10 mg by mouth at bedtime.     Marland Kitchen aspirin EC 81 MG tablet Take 81 mg by mouth at bedtime.     . Calcium Citrate-Vitamin D (CALCIUM CITRATE + PO) Take 4 capsules by mouth daily.    . carvedilol (COREG) 6.25 MG tablet Take 6.25 mg by mouth 2 (two) times daily with a  meal.    . Cholecalciferol (VITAMIN D3) 2000 UNITS capsule Take 2,000 Units by mouth daily.    . famotidine (PEPCID) 40 MG tablet Take 40 mg by mouth daily.    Marland Kitchen gabapentin (NEURONTIN) 100 MG capsule Take 200 mg by mouth at bedtime.    Marland Kitchen glimepiride (AMARYL) 2 MG tablet Take 2 mg by mouth daily with breakfast.    . glimepiride (AMARYL) 2 MG tablet Take 2 mg by mouth daily. 30 minutes before breakfast.    . losartan (COZAAR) 100 MG tablet Take 100 mg by mouth daily.    Marland Kitchen lovastatin (MEVACOR) 40 MG tablet Take 40 mg by mouth at bedtime.    . metFORMIN (GLUCOPHAGE) 1000 MG tablet Take 1 tablet (1,000 mg total) by mouth 2 (two) times daily with a meal.      Blood pressure (!) 164/67, pulse 95, temperature 98.9 F (37.2 C), temperature source Oral, resp. rate 18, SpO2 98 %. Physical Exam: Physical Exam  Constitutional: She appears well-developed and well-nourished. No distress.  HENT:  Head: Normocephalic and atraumatic.  Right Ear: External ear normal.  Left Ear: External ear normal.  Nose: Nose normal.  Mouth/Throat: Oropharynx is clear and moist and mucous membranes are normal.  Eyes: Conjunctivae and EOM are normal. Pupils are equal, round, and reactive to light. No scleral icterus.  Neck: Normal range of motion. Neck supple.  Cardiovascular: Normal rate, regular rhythm and intact distal pulses.  Exam reveals no gallop and no friction rub.   No murmur heard. Pulses:      Radial pulses are 2+ on the right side, and 2+ on the left side.       Dorsalis pedis pulses are 1+ on the right side, and 1+ on the left side.  Pulmonary/Chest: Effort normal and breath sounds normal. She has no wheezes. She has no rhonchi. She has no rales.  Abdominal: Soft. She exhibits no distension and no mass. Bowel sounds are decreased. There is tenderness in the right upper quadrant. There is no rigidity, no rebound and no guarding. No hernia.  Musculoskeletal: Normal range of motion. She exhibits no edema or  deformity.  Neurological: She is alert. She has normal strength. No sensory deficit.  Oriented to self, place and situation, but not time.   Skin: Skin is warm and dry. Capillary refill takes less than 2 seconds. No rash noted. She is not diaphoretic.  Psychiatric: She has a normal mood and affect. Her speech is normal and behavior is normal. Judgment and thought content normal.  Vitals reviewed.   Results for orders placed or performed during the hospital encounter of 12/17/16 (from the past 48 hour(s))  CBG monitoring, ED     Status: Abnormal   Collection Time: 12/17/16 10:43 PM  Result Value Ref Range   Glucose-Capillary 149 (H) 65 - 99 mg/dL  Lipase, blood     Status: None   Collection Time: 12/17/16 11:01 PM  Result Value Ref Range   Lipase 12 11 - 51 U/L  Comprehensive  metabolic panel     Status: Abnormal   Collection Time: 12/17/16 11:10 PM  Result Value Ref Range   Sodium 140 135 - 145 mmol/L   Potassium 4.2 3.5 - 5.1 mmol/L   Chloride 104 101 - 111 mmol/L   CO2 24 22 - 32 mmol/L   Glucose, Bld 160 (H) 65 - 99 mg/dL   BUN 19 6 - 20 mg/dL   Creatinine, Ser 1.28 (H) 0.44 - 1.00 mg/dL   Calcium 9.0 8.9 - 10.3 mg/dL   Total Protein 6.2 (L) 6.5 - 8.1 g/dL   Albumin 3.6 3.5 - 5.0 g/dL   AST 16 15 - 41 U/L   ALT 9 (L) 14 - 54 U/L   Alkaline Phosphatase 52 38 - 126 U/L   Total Bilirubin 0.9 0.3 - 1.2 mg/dL   GFR calc non Af Amer 36 (L) >60 mL/min   GFR calc Af Amer 42 (L) >60 mL/min    Comment: (NOTE) The eGFR has been calculated using the CKD EPI equation. This calculation has not been validated in all clinical situations. eGFR's persistently <60 mL/min signify possible Chronic Kidney Disease.    Anion gap 12 5 - 15  CBC     Status: Abnormal   Collection Time: 12/17/16 11:10 PM  Result Value Ref Range   WBC 19.6 (H) 4.0 - 10.5 K/uL   RBC 3.92 3.87 - 5.11 MIL/uL   Hemoglobin 9.4 (L) 12.0 - 15.0 g/dL   HCT 29.2 (L) 36.0 - 46.0 %   MCV 74.5 (L) 78.0 - 100.0 fL   MCH  24.0 (L) 26.0 - 34.0 pg   MCHC 32.2 30.0 - 36.0 g/dL   RDW 16.4 (H) 11.5 - 15.5 %   Platelets 167 150 - 400 K/uL  Differential     Status: Abnormal   Collection Time: 12/17/16 11:10 PM  Result Value Ref Range   Neutrophils Relative % 83 %   Neutro Abs 16.2 (H) 1.7 - 7.7 K/uL   Lymphocytes Relative 6 %   Lymphs Abs 1.1 0.7 - 4.0 K/uL   Monocytes Relative 12 %   Monocytes Absolute 2.3 (H) 0.1 - 1.0 K/uL   Eosinophils Relative 0 %   Eosinophils Absolute 0.0 0.0 - 0.7 K/uL   Basophils Relative 0 %   Basophils Absolute 0.0 0.0 - 0.1 K/uL  Urinalysis, Routine w reflex microscopic     Status: Abnormal   Collection Time: 12/17/16 11:20 PM  Result Value Ref Range   Color, Urine YELLOW YELLOW   APPearance HAZY (A) CLEAR   Specific Gravity, Urine 1.016 1.005 - 1.030   pH 5.0 5.0 - 8.0   Glucose, UA NEGATIVE NEGATIVE mg/dL   Hgb urine dipstick SMALL (A) NEGATIVE   Bilirubin Urine NEGATIVE NEGATIVE   Ketones, ur NEGATIVE NEGATIVE mg/dL   Protein, ur 100 (A) NEGATIVE mg/dL   Nitrite NEGATIVE NEGATIVE   Leukocytes, UA NEGATIVE NEGATIVE   RBC / HPF 0-5 0 - 5 RBC/hpf   WBC, UA 0-5 0 - 5 WBC/hpf   Bacteria, UA NONE SEEN NONE SEEN   Squamous Epithelial / LPF 0-5 (A) NONE SEEN   Mucous PRESENT   I-Stat CG4 Lactic Acid, ED     Status: None   Collection Time: 12/17/16 11:24 PM  Result Value Ref Range   Lactic Acid, Venous 1.56 0.5 - 1.9 mmol/L  CBG monitoring, ED     Status: Abnormal   Collection Time: 12/18/16  2:16 AM  Result Value Ref Range  Glucose-Capillary 146 (H) 65 - 99 mg/dL  Glucose, capillary     Status: Abnormal   Collection Time: 12/18/16  4:09 AM  Result Value Ref Range   Glucose-Capillary 171 (H) 65 - 99 mg/dL   Dg Chest 2 View  Result Date: 12/18/2016 CLINICAL DATA:  Altered mental status. EXAM: CHEST  2 VIEW COMPARISON:  Radiographs 07/04/2015 FINDINGS: Post median sternotomy and CABG. Low lung volumes. Unchanged heart size and mediastinal contours with atherosclerosis  of the aortic arch. Vascular stent projects over the superior mediastinum. No pulmonary edema. Mild bibasilar atelectasis. No confluent airspace disease. Possible trace left pleural effusion versus scarring. No pneumothorax. Degenerative change in the spine. IMPRESSION: Low lung volumes with bibasilar atelectasis. Post CABG with normal heart size with thoracic aortic atherosclerosis. Electronically Signed   By: Jeb Levering M.D.   On: 12/18/2016 00:00   Ct Head Wo Contrast  Result Date: 12/18/2016 CLINICAL DATA:  Confusion EXAM: CT HEAD WITHOUT CONTRAST TECHNIQUE: Contiguous axial images were obtained from the base of the skull through the vertex without intravenous contrast. COMPARISON:  Brain MRI 03/09/2013 FINDINGS: Brain: No mass lesion, intraparenchymal hemorrhage or extra-axial collection. No evidence of acute cortical infarct. There is periventricular hypoattenuation compatible with chronic microvascular disease. Vascular: No hyperdense vessel or unexpected calcification. Skull: Normal visualized skull base, calvarium and extracranial soft tissues. Sinuses/Orbits: No sinus fluid levels or advanced mucosal thickening. No mastoid effusion. Normal orbits. IMPRESSION: Chronic microvascular ischemia without acute intracranial abnormality. Electronically Signed   By: Ulyses Jarred M.D.   On: 12/18/2016 01:19   Ct Abdomen Pelvis W Contrast  Result Date: 12/18/2016 CLINICAL DATA:  Periumbilical pain after eating for the past several days. History hysterectomy. EXAM: CT ABDOMEN AND PELVIS WITH CONTRAST TECHNIQUE: Multidetector CT imaging of the abdomen and pelvis was performed using the standard protocol following bolus administration of intravenous contrast. CONTRAST:  42m ISOVUE-300 IOPAMIDOL (ISOVUE-300) INJECTION 61% COMPARISON:  None. FINDINGS: Lower chest: Trace bilateral pleural effusions. Borderline cardiomegaly with aortic atherosclerosis and coronary arteriosclerosis. The patient is status post  CABG. No pericardial effusion. Hepatobiliary: Mural calcification of the gallbladder consistent with porcelain gallbladder. Several large calcified gallstones are seen within. The gallbladder is distended and there is pericholecystic fluid which is more likely related to the current small bowel obstruction as opposed to acute cholecystitis. No biliary dilatation or hepatic mass. Pancreas: Atrophic pancreas without ductal dilatation. No choledocholithiasis. Spleen: Normal size spleen with a few small indeterminate 6 mm hypodensities potentially representing tiny cysts or hemangiomata. There appears to be a small splenule adjacent to the spleen. Adrenals/Urinary Tract: Normal bilateral adrenal glands. Mild cortical thinning of both kidneys without obstructive uropathy. Indeterminate 1.5 cm left upper pole hypodense cystic appearing lesion. Smaller hypodensities are seen in the lower pole of the left kidney. No nephrolithiasis. The urinary bladder is physiologically distended. Stomach/Bowel: The stomach is contracted. There is normal small bowel rotation. Abnormal fluid-filled dilated loops of jejunum and proximal to mid ileum up to 3.2 cm in diameter are identified with mild-to-moderate segmental thickening of distal ileum possibly representing an inflammatory stricture from inflammatory bowel disease. The exact point of transition is not definitively identified. Large bowel is nondistended and there is extensive descending and sigmoid diverticulosis with circular muscle hypertrophy noted. Vascular/Lymphatic: Moderate aorto bi-iliac and branch vessel atherosclerosis. Reproductive: Status post hysterectomy. No adnexal masses. Other: Small amount of free fluid is present within the abdomen pelvis. No free air. Coarse ventral subcutaneous pelvic calcification possibly from prior hysterectomy is noted. Musculoskeletal:  Grade 1 anterolisthesis of L4 on L5 with mild disc space narrowing. Moderate disc space narrowing at  L5-S1. No acute osseous abnormality. IMPRESSION: 1. Acute high-grade small bowel obstruction with long segmental thickening of the distal ileum possibly representing an inflammatory stricture or changes from inflammatory bowel disease contributing to the small bowel obstruction. The exact point of transition is not definitively identified. 2. 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 Pericholecystic fluid is more likely secondary to the small bowel obstruction and less likely due to acute cholecystitis. 3. Indeterminate left upper pole 1.5 cm cystic lesion of the kidney possibly complex cyst. Smaller hypodensities also statistically consistent with cysts but too small to further characterize are noted in the lower pole. 4. Aortoiliac atherosclerosis without aneurysm. Coronary arteriosclerosis. 5. Lower lumbar degenerative disc disease at L4-5 and L5-S1 with grade 1 anterolisthesis of L4 on L5. Electronically Signed   By: Ashley Royalty M.D.   On: 12/18/2016 01:30      Assessment/Plan SBO - initiate SBO protocol - NGT, elevate HOB, NPO and bowel rest, IVF - patient without n/v, but hypoactive bowel sounds and not passing flatus - will continue to follow  Porcelain Gallbladder - mild TTP of RUQ - LFTs normal - Acute cholecystitis not likely. I would not recommend cholecystectomy at this time  Leukocytosis - WBC 19.6 yesterday, Lactate 1.56 yesterday - Afebrile - Likely secondary to SBO, will continue to follow  HTN DM2 CAD  FEN - NPO, IVF, Antiemetics PRN if patient develops nausea VTE - lovenox  Brigid Re, [facility] Surgery 12/18/2016, 7:16 AM Pager: [phone] Consults: [phone] Mon-Fri 7:00 am-4:30 pm Sat-Sun 7:00 am-11:30 am

## 2016-12-18 NOTE — H&P (Signed)
History and Physical    [name] DOB: 07-20-1926 DOA: 12/17/2016  PCP: Sonya PennaHolwerda, Scott, MD  Patient coming from: AMS  I have personally briefly reviewed patient's old medical records in [facility]  Chief Complaint: AMS  HPI: [name] is a 83 y.o. female with medical history significant of DM2, MI.  Patient is brought in by family after having AMS this afternoon.  Patient was apparently confused and was asking questions repeatedly.  She denies any pain, fever, chills, cough.  When asked about vomiting says "not too much".  She denies abd pain, however family states that she was having abd pain around 4am yesterday when she was up trying to use bathroom.  She states she had a small BM at that time.  Per family members at bedside current mental status is actually close to baseline.   ED Course: WBC 19k, CT abd/pelvis shows high grade SBO with dilation of small bowel up to 3.2cm, also shows porcelain gallbladder.  Pericholecystic fluid that is favored to be from SBO over acute cholecystitis.  LFTs are WNL.  Creat of 1.28 is baseline (same in 2016 and 2017).     Review of Systems: As per HPI otherwise 10 point review of systems negative.   Past Medical History:  Diagnosis Date  . Arthritis    "qwhere"  . Hypercholesteremia   . Hypertension   . Myocardial infarction (HCC) 2003  . Stenosis of subclavian artery (HCC)   . Type II diabetes mellitus (HCC)     Past Surgical History:  Procedure Laterality Date  . ABDOMINAL HYSTERECTOMY  1970  . CARDIAC CATHETERIZATION    . CATARACT EXTRACTION Bilateral 2000's  . CORONARY ARTERY [place]RA[place]  [place]  . LEFT HEART CATHETERIZATION WITH CORONARY/GRAFT Chavez N/A 09/27/2014   Procedure: LEFT HEART CATHETERIZATION WITH Sonya CapriceORONARY/GRAFT Chavez;  Surgeon: Sonya PertJagadeesh R Ganji, MD;  [facility];  Service: Cardiovascular;  Laterality: N/A;  . UNILATERAL UPPER EXTREMEITY  Chavez N/A 09/27/2014   Procedure: SUBCLAVIAN ARTERIOGRAM ;  Surgeon: Sonya PertJagadeesh R Ganji, MD;  [facility];  Service: Cardiovascular;  Laterality: N/A;  . UNILATERAL UPPER EXTREMEITY Chavez N/A 11/09/2014   Procedure: Left subclavian stent ;  Surgeon: Sonya DecampJay Ganji, MD;  [facility];  Service: Cardiovascular;  Laterality: N/A;  . VISCERAL Chavez N/A 10/25/2014   Procedure: Sonya Chavez;  Surgeon: Sonya DecampJay Ganji, MD;  [facility];  Service: Cardiovascular;  Laterality: N/A;     reports that she has never smoked. She has never used smokeless tobacco. She reports that she does not drink alcohol or use drugs.  Allergies  Allergen Reactions  . Crestor [Rosuvastatin] Other (See Comments)    Myalgias  . Lipitor [Atorvastatin] Other (See Comments)    myalgias  . Zocor [Simvastatin] Other (See Comments)    myalgias    Family History  Problem Relation Age of Onset  . Gallbladder disease Neg Hx      Prior to Admission medications   Medication Sig Start Date End Date Taking? Authorizing Provider  acetaminophen (TYLENOL) 325 MG tablet Take 650 mg by mouth daily as needed (pain).    [provider]  alendronate (FOSAMAX) 70 MG tablet Take 70 mg by mouth every Monday.  12/08/14   [provider]  amLODipine (NORVASC) 10 MG tablet Take 10 mg by mouth at bedtime.  10/12/14   [provider]  aspirin EC 81 MG tablet Take  81 mg by mouth at bedtime.     [provider]  Calcium Citrate-Vitamin D (CALCIUM CITRATE + PO) Take 4 capsules by mouth daily.    [provider]  carvedilol (COREG) 6.25 MG tablet Take 6.25 mg by mouth 2 (two) times daily with a meal.    [provider]  chlorthalidone (HYGROTON) 25 MG tablet Take 1 tablet (25 mg total) by mouth daily. 09/29/14   Sonya Decamp, MD  Cholecalciferol (VITAMIN D3) 2000 UNITS capsule Take 2,000 Units by mouth daily.    [provider]  famotidine (PEPCID) 40 MG  tablet Take 40 mg by mouth daily. 11/23/14   [provider]  gabapentin (NEURONTIN) 100 MG capsule Take 200 mg by mouth at bedtime.    [provider]  glimepiride (AMARYL) 2 MG tablet Take 2 mg by mouth daily with breakfast.    [provider]  glimepiride (AMARYL) 2 MG tablet Take 2 mg by mouth daily. 30 minutes before breakfast. 08/23/14 08/23/15  [provider]  losartan (COZAAR) 100 MG tablet Take 100 mg by mouth daily. 12/08/14   [provider]  lovastatin (MEVACOR) 40 MG tablet Take 40 mg by mouth at bedtime.    [provider]  metFORMIN (GLUCOPHAGE) 1000 MG tablet Take 1 tablet (1,000 mg total) by mouth 2 (two) times daily with a meal. 11/11/14   Sonya Decamp, MD    Physical Exam: Vitals:   12/18/16 0030 12/18/16 0100 12/18/16 0130 12/18/16 0230  BP: (!) 170/80 (!) 165/59 (!) 154/80 (!) 163/92  Pulse: 94  84 87  Resp: (!) 23  (!) 31 (!) 29  Temp:      TempSrc:      SpO2: 97%  91% 98%    Constitutional: NAD, calm, comfortable Eyes: PERRL, lids and conjunctivae normal ENMT: Mucous membranes are moist. Posterior pharynx clear of any exudate or lesions.Normal dentition.  Neck: normal, supple, no masses, no thyromegaly Respiratory: Bibasilar rales, no respiratory distress Cardiovascular: Regular rate and rhythm, no murmurs / rubs / gallops. No extremity edema. 2+ pedal pulses. No carotid bruits.  Abdomen: RUQ TTP, no masses palpated. No hepatosplenomegaly. Bowel sounds positive.  Musculoskeletal: no clubbing / cyanosis. No joint deformity upper and lower extremities. Good ROM, no contractures. Normal muscle tone.  Skin: no rashes, lesions, ulcers. No induration Neurologic: CN 2-12 grossly intact. Sensation intact, DTR normal. Strength 5/5 in all 4.  Psychiatric: Normal judgment and insight. Oriented to person and place, but not time.   Labs on Admission: I have personally reviewed following labs and imaging studies  CBC:  Recent  Labs Lab 12/17/16 2310  WBC 19.6*  NEUTROABS 16.2*  HGB 9.4*  HCT 29.2*  MCV 74.5*  PLT 167   Basic Metabolic Panel:  Recent Labs Lab 12/17/16 2310  NA 140  K 4.2  CL 104  CO2 24  GLUCOSE 160*  BUN 19  CREATININE 1.28*  CALCIUM 9.0   GFR: CrCl cannot be calculated (Unknown ideal weight.). Liver Function Tests:  Recent Labs Lab 12/17/16 2310  AST 16  ALT 9*  ALKPHOS 52  BILITOT 0.9  PROT 6.2*  ALBUMIN 3.6    Recent Labs Lab 12/17/16 2301  LIPASE 12   No results for input(s): AMMONIA in the last 168 hours. Coagulation Profile: No results for input(s): INR, PROTIME in the last 168 hours. Cardiac Enzymes: No results for input(s): CKTOTAL, CKMB, CKMBINDEX, TROPONINI in the last 168 hours. BNP (last 3 results) No results for  input(s): PROBNP in the last 8760 hours. HbA1C: No results for input(s): HGBA1C in the last 72 hours. CBG:  Recent Labs Lab 12/17/16 2243 12/18/16 0216  GLUCAP 149* 146*   Lipid Profile: No results for input(s): CHOL, HDL, LDLCALC, TRIG, CHOLHDL, LDLDIRECT in the last 72 hours. Thyroid Function Tests: No results for input(s): TSH, T4TOTAL, FREET4, T3FREE, THYROIDAB in the last 72 hours. Anemia Panel: No results for input(s): VITAMINB12, FOLATE, FERRITIN, TIBC, IRON, RETICCTPCT in the last 72 hours. Urine analysis:    Component Value Date/Time   COLORURINE YELLOW 12/17/2016 2320   APPEARANCEUR HAZY (A) 12/17/2016 2320   LABSPEC 1.016 12/17/2016 2320   PHURINE 5.0 12/17/2016 2320   GLUCOSEU NEGATIVE 12/17/2016 2320   HGBUR SMALL (A) 12/17/2016 2320   BILIRUBINUR NEGATIVE 12/17/2016 2320   KETONESUR NEGATIVE 12/17/2016 2320   PROTEINUR 100 (A) 12/17/2016 2320   UROBILINOGEN 0.2 06/03/2013 1938   NITRITE NEGATIVE 12/17/2016 2320   LEUKOCYTESUR NEGATIVE 12/17/2016 2320    Radiological Exams on Admission: Dg Chest 2 View  Result Date: 12/18/2016 CLINICAL DATA:  Altered mental status. EXAM: CHEST  2 VIEW COMPARISON:   Radiographs 07/04/2015 FINDINGS: Post median sternotomy and CABG. Low lung volumes. Unchanged heart size and mediastinal contours with atherosclerosis of the aortic arch. Vascular stent projects over the superior mediastinum. No pulmonary edema. Mild bibasilar atelectasis. No confluent airspace disease. Possible trace left pleural effusion versus scarring. No pneumothorax. Degenerative change in the spine. IMPRESSION: Low lung volumes with bibasilar atelectasis. Post CABG with normal heart size with thoracic aortic atherosclerosis. Electronically Signed   By: Rubye Oaks M.D.   On: 12/18/2016 00:00   Ct Head Wo Contrast  Result Date: 12/18/2016 CLINICAL DATA:  Confusion EXAM: CT HEAD WITHOUT CONTRAST TECHNIQUE: Contiguous axial images were obtained from the base of the skull through the vertex without intravenous contrast. COMPARISON:  Brain MRI 03/09/2013 FINDINGS: Brain: No mass lesion, intraparenchymal hemorrhage or extra-axial collection. No evidence of acute cortical infarct. There is periventricular hypoattenuation compatible with chronic microvascular disease. Vascular: No hyperdense vessel or unexpected calcification. Skull: Normal visualized skull base, calvarium and extracranial soft tissues. Sinuses/Orbits: No sinus fluid levels or advanced mucosal thickening. No mastoid effusion. Normal orbits. IMPRESSION: Chronic microvascular ischemia without acute intracranial abnormality. Electronically Signed   By: Deatra Robinson M.D.   On: 12/18/2016 01:19   Ct Abdomen Pelvis W Contrast  Result Date: 12/18/2016 CLINICAL DATA:  Periumbilical pain after eating for the past several days. History hysterectomy. EXAM: CT ABDOMEN AND PELVIS WITH CONTRAST TECHNIQUE: Multidetector CT imaging of the abdomen and pelvis was performed using the standard protocol following bolus administration of intravenous contrast. CONTRAST:  75mL ISOVUE-300 IOPAMIDOL (ISOVUE-300) INJECTION 61% COMPARISON:  None. FINDINGS: Lower  chest: Trace bilateral pleural effusions. Borderline cardiomegaly with aortic atherosclerosis and coronary arteriosclerosis. The patient is status post CABG. No pericardial effusion. Hepatobiliary: Mural calcification of the gallbladder consistent with porcelain gallbladder. Several large calcified gallstones are seen within. The gallbladder is distended and there is pericholecystic fluid which is more likely related to the current small bowel obstruction as opposed to acute cholecystitis. No biliary dilatation or hepatic mass. Pancreas: Atrophic pancreas without ductal dilatation. No choledocholithiasis. Spleen: Normal size spleen with a few small indeterminate 6 mm hypodensities potentially representing tiny cysts or hemangiomata. There appears to be a small splenule adjacent to the spleen. Adrenals/Urinary Tract: Normal bilateral adrenal glands. Mild cortical thinning of both kidneys without obstructive uropathy. Indeterminate 1.5 cm left upper pole hypodense cystic appearing lesion.  Smaller hypodensities are seen in the lower pole of the left kidney. No nephrolithiasis. The urinary bladder is physiologically distended. Stomach/Bowel: The stomach is contracted. There is normal small bowel rotation. Abnormal fluid-filled dilated loops of jejunum and proximal to mid ileum up to 3.2 cm in diameter are identified with mild-to-moderate segmental thickening of distal ileum possibly representing an inflammatory stricture from inflammatory bowel disease. The exact point of transition is not definitively identified. Large bowel is nondistended and there is extensive descending and sigmoid diverticulosis with circular muscle hypertrophy noted. Vascular/Lymphatic: Moderate aorto bi-iliac and branch vessel atherosclerosis. Reproductive: Status post hysterectomy. No adnexal masses. Other: Small amount of free fluid is present within the abdomen pelvis. No free air. Coarse ventral subcutaneous pelvic calcification possibly  from prior hysterectomy is noted. Musculoskeletal: Grade 1 anterolisthesis of L4 on L5 with mild disc space narrowing. Moderate disc space narrowing at L5-S1. No acute osseous abnormality. IMPRESSION: 1. Acute high-grade small bowel obstruction with long segmental thickening of the distal ileum possibly representing an inflammatory stricture or changes from inflammatory bowel disease contributing to the small bowel obstruction. The exact point of transition is not definitively identified. 2. 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 Pericholecystic fluid is more likely secondary to the small bowel obstruction and less likely due to acute cholecystitis. 3. Indeterminate left upper pole 1.5 cm cystic lesion of the kidney possibly complex cyst. Smaller hypodensities also statistically consistent with cysts but too small to further characterize are noted in the lower pole. 4. Aortoiliac atherosclerosis without aneurysm. Coronary arteriosclerosis. 5. Lower lumbar degenerative disc disease at L4-5 and L5-S1 with grade 1 anterolisthesis of L4 on L5. Electronically Signed   By: Tollie Eth M.D.   On: 12/18/2016 01:30    EKG: Independently reviewed.  Assessment/Plan Principal Problem:   SBO (small bowel obstruction) (HCC) Active Problems:   HTN (hypertension)   DM (diabetes mellitus) (HCC)   CAD (coronary artery disease)   Delirium due to another medical condition   Porcelain gallbladder   Leukocytosis    1. SBO - 1. NPO 2. IVF 3. Surgery to eval in AM 4. Lactate nl, patient is actually denying N/V or abd pain right now despite the CT findings 5. As such, will hold off on NGT placement 2. Porcelain gallbladder - 1. Surgery to eval in AM 2. does have TTP of RUQ 3. presumably chronic cholecystitis as this is generally the cause of porcelain gallbladder 4. LFTs are normal 3. HTN - 1. Continue home Norvasc and losartan - which she told me she believes she is still  taking 2. Continue home Coreg if we can confirm she is still taking this 3. Holding chlorthalidone, holding lasix (not sure if she is taking these or not, and holding in setting of SBO anyhow). 4. DM2 - 1. Hold home PO hypoglycemics 2. Sensitive scale SSI Q4H 5. Delirium - suspect secondary to SBO 6. Leukocytosis - 1. suspect secondary to SBO 2. however, Tm of 99.x in ED is noted.  Keep an eye on this. 3. Acute cholecystitis is felt less likely by Radiologist 4. Also not really complaining of abd pain unless I press on RUQ 5. Additionally LFTs are not elevated. 6. Regarding other possible sources: CXR is negative, CT visualized lungs also negative, UA negative. 7. CAD - 1. continue ASA 81 2. Holding Statin since shes not sure if shes still taking, and a large number of statins are on her allergies list anyhow.  DVT prophylaxis: Lovenox Code Status: Full  Family Communication: Family at bedside Disposition Plan: Home after admit Consults called: General surgery will see in AM Admission status: Admit to inpatient   Hillary Bow DO [facility] Pager [phone]  If 7AM-7PM, please contact day team taking care of patient [email or web address] Password [email or web address]  12/18/2016, 2:59 AM

## 2016-12-19 ENCOUNTER — Inpatient Hospital Stay (HOSPITAL_COMMUNITY): Payer: Medicare Other

## 2016-12-19 LAB — GLUCOSE, CAPILLARY
GLUCOSE-CAPILLARY: 121 mg/dL — AB (ref 65–99)
GLUCOSE-CAPILLARY: 156 mg/dL — AB (ref 65–99)
Glucose-Capillary: 101 mg/dL — ABNORMAL HIGH (ref 65–99)
Glucose-Capillary: 106 mg/dL — ABNORMAL HIGH (ref 65–99)
Glucose-Capillary: 130 mg/dL — ABNORMAL HIGH (ref 65–99)
Glucose-Capillary: 156 mg/dL — ABNORMAL HIGH (ref 65–99)

## 2016-12-19 LAB — BASIC METABOLIC PANEL
Anion gap: 14 (ref 5–15)
BUN: 33 mg/dL — AB (ref 6–20)
CALCIUM: 8.5 mg/dL — AB (ref 8.9–10.3)
CO2: 25 mmol/L (ref 22–32)
CREATININE: 1.62 mg/dL — AB (ref 0.44–1.00)
Chloride: 104 mmol/L (ref 101–111)
GFR calc non Af Amer: 27 mL/min — ABNORMAL LOW (ref >60)
GFR, EST AFRICAN AMERICAN: 31 mL/min — AB (ref >60)
Glucose, Bld: 102 mg/dL — ABNORMAL HIGH (ref 65–99)
Potassium: 2.9 mmol/L — ABNORMAL LOW (ref 3.5–5.1)
SODIUM: 143 mmol/L (ref 135–145)

## 2016-12-19 LAB — CBC
HCT: 29.9 % — ABNORMAL LOW (ref 36.0–46.0)
Hemoglobin: 9.4 g/dL — ABNORMAL LOW (ref 12.0–15.0)
MCH: 23.4 pg — AB (ref 26.0–34.0)
MCHC: 31.4 g/dL (ref 30.0–36.0)
MCV: 74.6 fL — ABNORMAL LOW (ref 78.0–100.0)
Platelets: 159 10*3/uL (ref 150–400)
RBC: 4.01 MIL/uL (ref 3.87–5.11)
RDW: 16.4 % — AB (ref 11.5–15.5)
WBC: 17.2 10*3/uL — ABNORMAL HIGH (ref 4.0–10.5)

## 2016-12-19 MED ORDER — POTASSIUM CHLORIDE 10 MEQ/100ML IV SOLN
10.0000 meq | INTRAVENOUS | Status: DC
  Administered 2016-12-19: 10 meq via INTRAVENOUS
  Filled 2016-12-19 (×2): qty 100

## 2016-12-19 MED ORDER — POTASSIUM CHLORIDE 10 MEQ/100ML IV SOLN
10.0000 meq | INTRAVENOUS | Status: AC
  Administered 2016-12-19 (×3): 10 meq via INTRAVENOUS
  Filled 2016-12-19 (×3): qty 100

## 2016-12-19 MED ORDER — ORAL CARE MOUTH RINSE
15.0000 mL | Freq: Two times a day (BID) | OROMUCOSAL | Status: DC
  Administered 2016-12-19 – 2016-12-25 (×10): 15 mL via OROMUCOSAL

## 2016-12-19 MED ORDER — ENOXAPARIN SODIUM 30 MG/0.3ML ~~LOC~~ SOLN: 30.0000 mg | Freq: Every day | SUBCUTANEOUS | Status: DC

## 2016-12-19 MED ORDER — SODIUM CHLORIDE 0.9 % IV BOLUS (SEPSIS)
500.0000 mL | Freq: Once | INTRAVENOUS | Status: AC
  Administered 2016-12-19: 500 mL via INTRAVENOUS

## 2016-12-19 NOTE — Progress Notes (Addendum)
[facility]  PROGRESS NOTE  [place] Sonya Chavez ZOX:[id number] DOB: 1926-03-11 DOA: 12/17/2016 PCP: Alysia Penna, MD   Brief HPI:   83 y.o.femalewith medical history significant of DM2, MI. Patient is brought in by family after having AMS this afternoon. Patient was apparently confused and was asking questions repeatedly. She denies any pain, fever, chills, cough. When asked about vomiting says "not too much". She denies abd pain, however family states that she was having abd pain around 4am yesterday when she was up trying to use bathroom. She states she had a small BM at that time. Per family members at bedside current mental status is actually close to baseline.  Patient was seen by general surgery and NG tube has been inserted.    Subjective   This morning patient feels better, denies abdominal pain nausea vomiting. NG tube in place   Assessment/Plan:     1. Small bowel obstruction- NG tube in place, abdominal x-ray done today again shows small bowel obstruction. General surgery following. Will change IV normal saline to 100 mL per hour 2. Hypokalemia- potassium was 2.9 today, will replace potassium and check BMP in a.Sonya. 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 4. Diabetes mellitus- blood glucose is well controlled, continue sliding scale insulin with NovoLog 5. Hypertension- blood pressure is well controlled, chlorthalidone on hold, continue losartan, amlodipine. 6. CAD- stable, continue aspirin 7. Leukocytosis-likely reactive chest x-ray negative for pneumonia, UA clear. Patient is afebrile. Will follow CBC in a.Sonya.    DVT prophylaxis: Lovenox  Code Status -full code  Family Communication: Discussed with patient's husband at bedside  Disposition Plan- as per general surgery   Consultants:  General surgery  Procedures:  None  Continuous infusions . sodium chloride 75 mL/hr at 12/19/16 1355       Antibiotics:   Anti-infectives    None       Objective   Vitals:   12/18/16 2015 12/18/16 2120 12/19/16 0600 12/19/16 0808  BP: (!) 153/71 (!) 153/74 (!) 138/51   Pulse: 93  96   Resp: 18  17   Temp: 98.5 F (36.9 C)  98 F (36.7 C)   TempSrc: Oral  Oral   SpO2: 98%  100%   Weight:    55 kg (121 lb 3.2 oz)  Height:    5\' 2"  (1.575 Sonya)    Intake/Output Summary (Last 24 hours) at 12/19/16 1549 Last data filed at 12/19/16 0827  Gross per 24 hour  Intake              990 ml  Output             2025 ml  Net            -1035 ml   Filed Weights   12/19/16 0808  Weight: 55 kg (121 lb 3.2 oz)     Physical Examination:   Physical Exam: Eyes: No icterus, extraocular muscles intact  Mouth: Oral mucosa is moist, no lesions on palate,  Neck: Supple, no deformities, masses, or tenderness Lungs: Normal respiratory effort, bilateral clear to auscultation, no crackles or wheezes.  Heart: Regular rate and rhythm, S1 and S2 normal, no murmurs, rubs auscultated Abdomen: BS normoactive,soft,nondistended,non-tender to palpation,no organomegaly Extremities: No pretibial edema, no erythema, no cyanosis, no clubbing Neuro : Alert and oriented to time, place and person, No focal deficits  Skin: No rashes seen on exam     Data Reviewed: I have personally reviewed following labs  and imaging studies  CBG:  Recent Labs Lab 12/18/16 2017 12/19/16 0041 12/19/16 0554 12/19/16 0807 12/19/16 1221  GLUCAP 111* 156* 121* 156* 106*    CBC:  Recent Labs Lab 12/17/16 2310 12/18/16 0857 12/19/16 0356  WBC 19.6* 19.8* 17.2*  NEUTROABS 16.2*  --   --   HGB 9.4* 9.7* 9.4*  HCT 29.2* 31.0* 29.9*  MCV 74.5* 75.2* 74.6*  PLT 167 159 159    Basic Metabolic Panel:  Recent Labs Lab 12/17/16 2310 12/18/16 0857 12/19/16 0356  NA 140 137 143  K 4.2 3.7 2.9*  CL 104 104 104  CO2 24 21* 25  GLUCOSE 160* 121* 102*  BUN 19 24* 33*  CREATININE 1.28* 1.30* 1.62*   CALCIUM 9.0 8.5* 8.5*    No results found for this or any previous visit (from the past 240 hour(s)).   Liver Function Tests:  Recent Labs Lab 12/17/16 2310  AST 16  ALT 9*  ALKPHOS 52  BILITOT 0.9  PROT 6.2*  ALBUMIN 3.6    Recent Labs Lab 12/17/16 2301  LIPASE 12   No results for input(s): AMMONIA in the last 168 hours.  Cardiac Enzymes: No results for input(s): CKTOTAL, CKMB, CKMBINDEX, TROPONINI in the last 168 hours. BNP (last 3 results) No results for input(s): BNP in the last 8760 hours.  ProBNP (last 3 results) No results for input(s): PROBNP in the last 8760 hours.    Studies: Dg Chest 2 View  Result Date: 12/18/2016 CLINICAL DATA:  Altered mental status. EXAM: CHEST  2 VIEW COMPARISON:  Radiographs 07/04/2015 FINDINGS: Post median sternotomy and CABG. Low lung volumes. Unchanged heart size and mediastinal contours with atherosclerosis of the aortic arch. Vascular stent projects over the superior mediastinum. No pulmonary edema. Mild bibasilar atelectasis. No confluent airspace disease. Possible trace left pleural effusion versus scarring. No pneumothorax. Degenerative change in the spine. IMPRESSION: Low lung volumes with bibasilar atelectasis. Post CABG with normal heart size with thoracic aortic atherosclerosis. Electronically Signed   By: Rubye Oaks Sonya.D.   On: 12/18/2016 00:00   Dg Abd 1 View  Result Date: 12/19/2016 CLINICAL DATA:  Follow up small bowel obstruction EXAM: ABDOMEN - 1 VIEW COMPARISON:  12/18/2016 FINDINGS: Contrast material is again noted throughout the small bowel with multiple loops of dilated small bowel consistent with a high-grade small bowel obstruction. No contrast has passed into the colon. Stable bony structures are noted. Nasogastric catheter lies in the distal stomach. IMPRESSION: Stable changes of small-bowel obstruction. Contrast remains scattered throughout small bowel. Electronically Signed   By: Alcide Clever Sonya.D.   On:  12/19/2016 13:23   Ct Head Wo Contrast  Result Date: 12/18/2016 CLINICAL DATA:  Confusion EXAM: CT HEAD WITHOUT CONTRAST TECHNIQUE: Contiguous axial images were obtained from the base of the skull through the vertex without intravenous contrast. COMPARISON:  Brain MRI 03/09/2013 FINDINGS: Brain: No mass lesion, intraparenchymal hemorrhage or extra-axial collection. No evidence of acute cortical infarct. There is periventricular hypoattenuation compatible with chronic microvascular disease. Vascular: No hyperdense vessel or unexpected calcification. Skull: Normal visualized skull base, calvarium and extracranial soft tissues. Sinuses/Orbits: No sinus fluid levels or advanced mucosal thickening. No mastoid effusion. Normal orbits. IMPRESSION: Chronic microvascular ischemia without acute intracranial abnormality. Electronically Signed   By: Deatra Robinson Sonya.D.   On: 12/18/2016 01:19   Ct Abdomen Pelvis W Contrast  Result Date: 12/18/2016 CLINICAL DATA:  Periumbilical pain after eating for the past several days. History hysterectomy. EXAM: CT ABDOMEN AND  PELVIS WITH CONTRAST TECHNIQUE: Multidetector CT imaging of the abdomen and pelvis was performed using the standard protocol following bolus administration of intravenous contrast. CONTRAST:  75mL ISOVUE-300 IOPAMIDOL (ISOVUE-300) INJECTION 61% COMPARISON:  None. FINDINGS: Lower chest: Trace bilateral pleural effusions. Borderline cardiomegaly with aortic atherosclerosis and coronary arteriosclerosis. The patient is status post CABG. No pericardial effusion. Hepatobiliary: Mural calcification of the gallbladder consistent with porcelain gallbladder. Several large calcified gallstones are seen within. The gallbladder is distended and there is pericholecystic fluid which is more likely related to the current small bowel obstruction as opposed to acute cholecystitis. No biliary dilatation or hepatic mass. Pancreas: Atrophic pancreas without ductal dilatation. No  choledocholithiasis. Spleen: Normal size spleen with a few small indeterminate 6 mm hypodensities potentially representing tiny cysts or hemangiomata. There appears to be a small splenule adjacent to the spleen. Adrenals/Urinary Tract: Normal bilateral adrenal glands. Mild cortical thinning of both kidneys without obstructive uropathy. Indeterminate 1.5 cm left upper pole hypodense cystic appearing lesion. Smaller hypodensities are seen in the lower pole of the left kidney. No nephrolithiasis. The urinary bladder is physiologically distended. Stomach/Bowel: The stomach is contracted. There is normal small bowel rotation. Abnormal fluid-filled dilated loops of jejunum and proximal to mid ileum up to 3.2 cm in diameter are identified with mild-to-moderate segmental thickening of distal ileum possibly representing an inflammatory stricture from inflammatory bowel disease. The exact point of transition is not definitively identified. Large bowel is nondistended and there is extensive descending and sigmoid diverticulosis with circular muscle hypertrophy noted. Vascular/Lymphatic: Moderate aorto bi-iliac and branch vessel atherosclerosis. Reproductive: Status post hysterectomy. No adnexal masses. Other: Small amount of free fluid is present within the abdomen pelvis. No free air. Coarse ventral subcutaneous pelvic calcification possibly from prior hysterectomy is noted. Musculoskeletal: Grade 1 anterolisthesis of L4 on L5 with mild disc space narrowing. Moderate disc space narrowing at L5-S1. No acute osseous abnormality. IMPRESSION: 1. Acute high-grade small bowel obstruction with long segmental thickening of the distal ileum possibly representing an inflammatory stricture or changes from inflammatory bowel disease contributing to the small bowel obstruction. The exact point of transition is not definitively identified. 2. 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 Pericholecystic fluid is more  likely secondary to the small bowel obstruction and less likely due to acute cholecystitis. 3. Indeterminate left upper pole 1.5 cm cystic lesion of the kidney possibly complex cyst. Smaller hypodensities also statistically consistent with cysts but too small to further characterize are noted in the lower pole. 4. Aortoiliac atherosclerosis without aneurysm. Coronary arteriosclerosis. 5. Lower lumbar degenerative disc disease at L4-5 and L5-S1 with grade 1 anterolisthesis of L4 on L5. Electronically Signed   By: Tollie Eth Sonya.D.   On: 12/18/2016 01:30   Dg Abd Portable 1v-small Bowel Obstruction Protocol-initial, 8 Hr Delay  Result Date: 12/18/2016 CLINICAL DATA:  Small bowel obstruction protocol. 8 hour delayed image. EXAM: PORTABLE ABDOMEN - 1 VIEW COMPARISON:  12/18/2016 at 9:59 a.Sonya. FINDINGS: 8 hours following injection of oral contrast through the nasogastric tube, an AP supine radiograph of the abdomen was acquired. Nasogastric tube curls within distal stomach. There is residual IV contrast within the bladder. IMPRESSION: 1. Moderate to high-grade small bowel obstruction. Contrast has progressed into the small bowel but does not extend into the colon. Contrast primarily is within the jejunum. Electronically Signed   By: Amie Portland Sonya.D.   On: 12/18/2016 20:47   Dg Abd Portable 1v-small Bowel Protocol-position Verification  Result Date: 12/18/2016 CLINICAL DATA:  NG  tube placement. EXAM: PORTABLE ABDOMEN - 1 VIEW COMPARISON:  CT 12/18/2016. FINDINGS: Prior median sternotomy. NG tube noted with its tip projected over the distal stomach. Porcelain gallbladder and gallstones again noted. Persistent dilated loops of small bowel are noted consistent with small bowel obstruction as previously demonstrated on CT of 12/18/2016 . No free air. Contrast is noted in the bladder. Possible bladder diverticulum present. Aortoiliac atherosclerotic vascular disease. Degenerative changes thoracolumbar spine.  IMPRESSION: 1. NG tube noted with its tip projected over the distal stomach. Persistent distended loops of small bowel consistent with small bowel obstruction as previously identified on CT of 12/18/2016. 2.  Porcelain gallbladder/gallstones again noted. 3. Aortoiliac atherosclerotic vascular disease. Electronically Signed   By: Maisie Fushomas  Register   On: 12/18/2016 10:21    Scheduled Meds: . amLODipine  10 mg Oral QHS  . aspirin EC  81 mg Oral QHS  . [START ON 12/20/2016] enoxaparin (LOVENOX) injection  30 mg Subcutaneous Daily  . gabapentin  200 mg Oral QHS  . insulin aspart  0-9 Units Subcutaneous Q4H  . losartan  100 mg Oral Daily  . mouth rinse  15 mL Mouth Rinse BID      Time spent: 25 min  [facility],Jakavion Bilodeau S   [facility] Pager [phone]. If 7PM-7AM, please contact night-coverage at [email or web address], Office  [phone]  password [email or web address] 12/19/2016, 3:49 PM  LOS: 1 day

## 2016-12-19 NOTE — Progress Notes (Signed)
[facility] Progress Note     Subjective: CC: SBO Patient states abdominal pain is improved from yesterday. She otherwise feels well. UOP has been good. Denies N/V. No flatus. Patient still oriented to person, place and situation, but not time. Discussed findings of abdominal x-ray with patient and family member, and they voiced understanding.   Objective: Vital signs in last 24 hours: Temp:  [98 F (36.7 C)-98.6 F (37 C)] 98 F (36.7 C) (05/17 0600) Pulse Rate:  [93-97] 96 (05/17 0600) Resp:  [17-18] 17 (05/17 0600) BP: (138-156)/(51-74) 138/51 (05/17 0600) SpO2:  [90 %-100 %] 100 % (05/17 0600) Last BM Date: 12/17/16  Intake/Output from previous day: 05/16 0701 - 05/17 0700 In: 990 [I.V.:990] Out: 1775 [Urine:200; Emesis/NG output:1575] Intake/Output this shift: No intake/output data recorded.  PE: Gen:  Alert, NAD, pleasant Card:  Regular rate and rhythm, pedal pulses 2+ BL Pulm:  Normal effort, clear to auscultation bilaterally Abd: Soft, Minimally TTP in the RLQ, non-distended, hypoactive bowel sounds in all 4 quadrants, no HSM, incisions C/D/I Skin: warm and dry, no rashes  Psych: A&O to person, place and situation. Normal affect.   Lab Results:   Recent Labs  12/18/16 0857 12/19/16 0356  WBC 19.8* 17.2*  HGB 9.7* 9.4*  HCT 31.0* 29.9*  PLT 159 159   BMET  Recent Labs  12/18/16 0857 12/19/16 0356  NA 137 143  K 3.7 2.9*  CL 104 104  CO2 21* 25  GLUCOSE 121* 102*  BUN 24* 33*  CREATININE 1.30* 1.62*  CALCIUM 8.5* 8.5*   PT/INR No results for input(s): LABPROT, INR in the last 72 hours. CMP     Component Value Date/Time   NA 143 12/19/2016 0356   K 2.9 (L) 12/19/2016 0356   CL 104 12/19/2016 0356   CO2 25 12/19/2016 0356   GLUCOSE 102 (H) 12/19/2016 0356   BUN 33 (H) 12/19/2016 0356   CREATININE 1.62 (H) 12/19/2016 0356   CALCIUM 8.5 (L) 12/19/2016 0356   PROT 6.2 (L) 12/17/2016 2310   ALBUMIN 3.6 12/17/2016 2310   AST 16  12/17/2016 2310   ALT 9 (L) 12/17/2016 2310   ALKPHOS 52 12/17/2016 2310   BILITOT 0.9 12/17/2016 2310   GFRNONAA 27 (L) 12/19/2016 0356   GFRAA 31 (L) 12/19/2016 0356   Lipase     Component Value Date/Time   LIPASE 12 12/17/2016 2301    Studies/Results: Dg Chest 2 View  Result Date: 12/18/2016 CLINICAL DATA:  Altered mental status. EXAM: CHEST  2 VIEW COMPARISON:  Radiographs 07/04/2015 FINDINGS: Post median sternotomy and CABG. Low lung volumes. Unchanged heart size and mediastinal contours with atherosclerosis of the aortic arch. Vascular stent projects over the superior mediastinum. No pulmonary edema. Mild bibasilar atelectasis. No confluent airspace disease. Possible trace left pleural effusion versus scarring. No pneumothorax. Degenerative change in the spine. IMPRESSION: Low lung volumes with bibasilar atelectasis. Post CABG with normal heart size with thoracic aortic atherosclerosis. Electronically Signed   By: Rubye OaksMelanie  Ehinger M.D.   On: 12/18/2016 00:00   Ct Head Wo Contrast  Result Date: 12/18/2016 CLINICAL DATA:  Confusion EXAM: CT HEAD WITHOUT CONTRAST TECHNIQUE: Contiguous axial images were obtained from the base of the skull through the vertex without intravenous contrast. COMPARISON:  Brain MRI 03/09/2013 FINDINGS: Brain: No mass lesion, intraparenchymal hemorrhage or extra-axial collection. No evidence of acute cortical infarct. There is periventricular hypoattenuation compatible with chronic microvascular disease. Vascular: No hyperdense vessel or unexpected calcification. Skull: Normal visualized  skull base, calvarium and extracranial soft tissues. Sinuses/Orbits: No sinus fluid levels or advanced mucosal thickening. No mastoid effusion. Normal orbits. IMPRESSION: Chronic microvascular ischemia without acute intracranial abnormality. Electronically Signed   By: Deatra Robinson M.D.   On: 12/18/2016 01:19   Ct Abdomen Pelvis W Contrast  Result Date: 12/18/2016 CLINICAL DATA:   Periumbilical pain after eating for the past several days. History hysterectomy. EXAM: CT ABDOMEN AND PELVIS WITH CONTRAST TECHNIQUE: Multidetector CT imaging of the abdomen and pelvis was performed using the standard protocol following bolus administration of intravenous contrast. CONTRAST:  75mL ISOVUE-300 IOPAMIDOL (ISOVUE-300) INJECTION 61% COMPARISON:  None. FINDINGS: Lower chest: Trace bilateral pleural effusions. Borderline cardiomegaly with aortic atherosclerosis and coronary arteriosclerosis. The patient is status post CABG. No pericardial effusion. Hepatobiliary: Mural calcification of the gallbladder consistent with porcelain gallbladder. Several large calcified gallstones are seen within. The gallbladder is distended and there is pericholecystic fluid which is more likely related to the current small bowel obstruction as opposed to acute cholecystitis. No biliary dilatation or hepatic mass. Pancreas: Atrophic pancreas without ductal dilatation. No choledocholithiasis. Spleen: Normal size spleen with a few small indeterminate 6 mm hypodensities potentially representing tiny cysts or hemangiomata. There appears to be a small splenule adjacent to the spleen. Adrenals/Urinary Tract: Normal bilateral adrenal glands. Mild cortical thinning of both kidneys without obstructive uropathy. Indeterminate 1.5 cm left upper pole hypodense cystic appearing lesion. Smaller hypodensities are seen in the lower pole of the left kidney. No nephrolithiasis. The urinary bladder is physiologically distended. Stomach/Bowel: The stomach is contracted. There is normal small bowel rotation. Abnormal fluid-filled dilated loops of jejunum and proximal to mid ileum up to 3.2 cm in diameter are identified with mild-to-moderate segmental thickening of distal ileum possibly representing an inflammatory stricture from inflammatory bowel disease. The exact point of transition is not definitively identified. Large bowel is nondistended and  there is extensive descending and sigmoid diverticulosis with circular muscle hypertrophy noted. Vascular/Lymphatic: Moderate aorto bi-iliac and branch vessel atherosclerosis. Reproductive: Status post hysterectomy. No adnexal masses. Other: Small amount of free fluid is present within the abdomen pelvis. No free air. Coarse ventral subcutaneous pelvic calcification possibly from prior hysterectomy is noted. Musculoskeletal: Grade 1 anterolisthesis of L4 on L5 with mild disc space narrowing. Moderate disc space narrowing at L5-S1. No acute osseous abnormality. IMPRESSION: 1. Acute high-grade small bowel obstruction with long segmental thickening of the distal ileum possibly representing an inflammatory stricture or changes from inflammatory bowel disease contributing to the small bowel obstruction. The exact point of transition is not definitively identified. 2. 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 Pericholecystic fluid is more likely secondary to the small bowel obstruction and less likely due to acute cholecystitis. 3. Indeterminate left upper pole 1.5 cm cystic lesion of the kidney possibly complex cyst. Smaller hypodensities also statistically consistent with cysts but too small to further characterize are noted in the lower pole. 4. Aortoiliac atherosclerosis without aneurysm. Coronary arteriosclerosis. 5. Lower lumbar degenerative disc disease at L4-5 and L5-S1 with grade 1 anterolisthesis of L4 on L5. Electronically Signed   By: Tollie Eth M.D.   On: 12/18/2016 01:30   Dg Abd Portable 1v-small Bowel Obstruction Protocol-initial, 8 Hr Delay  Result Date: 12/18/2016 CLINICAL DATA:  Small bowel obstruction protocol. 8 hour delayed image. EXAM: PORTABLE ABDOMEN - 1 VIEW COMPARISON:  12/18/2016 at 9:59 a.m. FINDINGS: 8 hours following injection of oral contrast through the nasogastric tube, an AP supine radiograph of the abdomen was acquired. Nasogastric  tube curls within  distal stomach. There is residual IV contrast within the bladder. IMPRESSION: 1. Moderate to high-grade small bowel obstruction. Contrast has progressed into the small bowel but does not extend into the colon. Contrast primarily is within the jejunum. Electronically Signed   By: Amie Portland M.D.   On: 12/18/2016 20:47   Dg Abd Portable 1v-small Bowel Protocol-position Verification  Result Date: 12/18/2016 CLINICAL DATA:  NG tube placement. EXAM: PORTABLE ABDOMEN - 1 VIEW COMPARISON:  CT 12/18/2016. FINDINGS: Prior median sternotomy. NG tube noted with its tip projected over the distal stomach. Porcelain gallbladder and gallstones again noted. Persistent dilated loops of small bowel are noted consistent with small bowel obstruction as previously demonstrated on CT of 12/18/2016 . No free air. Contrast is noted in the bladder. Possible bladder diverticulum present. Aortoiliac atherosclerotic vascular disease. Degenerative changes thoracolumbar spine. IMPRESSION: 1. NG tube noted with its tip projected over the distal stomach. Persistent distended loops of small bowel consistent with small bowel obstruction as previously identified on CT of 12/18/2016. 2.  Porcelain gallbladder/gallstones again noted. 3. Aortoiliac atherosclerotic vascular disease. Electronically Signed   By: Maisie Fus  Register   On: 12/18/2016 10:21    Anti-infectives: Anti-infectives    None       Assessment/Plan SBO - KUB shows moderate to high-grade SBO w/ contrast primarily in the jejunum  - Will repeat KUB this afternoon and again tomorrow AM - about 1,775 cc green-brown output from NG  - clinically patient does not appear to be worsening - continue with conservative management  Porcelain Gallbladder - no TTP of the RUQ today - LFTs were normal yesterday - Acute cholecystitis not likely.   Leukocytosis - WBC 17.2 from 19.8 yesterday - Afebrile overnight - AM labs   Elevated Creatinine - Increase in creatinine  from 1.3 to 1.62 - UOP is documented as 200, but per patient has been more - strict I/O - management per primary service  HTN DM2 CAD  FEN - NPO, IVF, Antiemetics PRN if patient develops nausea - electrolyte abnormalities - consider switching to D5 1/2NS w/ KCl per primary service VTE - lovenox  LOS: 1 day    Wells Guiles , [facility] 12/19/2016, 7:33 AM Pager: [phone] Consults: [phone] Mon-Fri 7:00 am-4:30 pm Sat-Sun 7:00 am-11:30 am

## 2016-12-20 ENCOUNTER — Encounter (HOSPITAL_COMMUNITY): Payer: Self-pay | Admitting: Surgery

## 2016-12-20 ENCOUNTER — Inpatient Hospital Stay (HOSPITAL_COMMUNITY): Payer: Medicare Other | Admitting: Certified Registered Nurse Anesthetist

## 2016-12-20 ENCOUNTER — Encounter (HOSPITAL_COMMUNITY)
Admission: EM | Disposition: A | Discharge status: Home / Self Care | Payer: Self-pay | Source: Home / Self Care | Attending: Family Medicine

## 2016-12-20 ENCOUNTER — Inpatient Hospital Stay (HOSPITAL_COMMUNITY): Payer: Medicare Other

## 2016-12-20 DIAGNOSIS — N289 Disorder of kidney and ureter, unspecified: Secondary | ICD-10-CM

## 2016-12-20 HISTORY — PX: LAPAROTOMY: SHX154

## 2016-12-20 HISTORY — PX: BOWEL RESECTION: SHX1257

## 2016-12-20 LAB — BASIC METABOLIC PANEL
Anion gap: 15 (ref 5–15)
BUN: 43 mg/dL — AB (ref 6–20)
CHLORIDE: 107 mmol/L (ref 101–111)
CO2: 24 mmol/L (ref 22–32)
CREATININE: 1.69 mg/dL — AB (ref 0.44–1.00)
Calcium: 8.1 mg/dL — ABNORMAL LOW (ref 8.9–10.3)
GFR calc Af Amer: 30 mL/min — ABNORMAL LOW (ref >60)
GFR calc non Af Amer: 26 mL/min — ABNORMAL LOW (ref >60)
GLUCOSE: 145 mg/dL — AB (ref 65–99)
Potassium: 3 mmol/L — ABNORMAL LOW (ref 3.5–5.1)
SODIUM: 146 mmol/L — AB (ref 135–145)

## 2016-12-20 LAB — SURGICAL PCR SCREEN
MRSA, PCR: NEGATIVE
STAPHYLOCOCCUS AUREUS: NEGATIVE

## 2016-12-20 LAB — GLUCOSE, CAPILLARY
GLUCOSE-CAPILLARY: 128 mg/dL — AB (ref 65–99)
GLUCOSE-CAPILLARY: 135 mg/dL — AB (ref 65–99)
GLUCOSE-CAPILLARY: 104 mg/dL — AB (ref 65–99)
Glucose-Capillary: 107 mg/dL — ABNORMAL HIGH (ref 65–99)
Glucose-Capillary: 110 mg/dL — ABNORMAL HIGH (ref 65–99)
Glucose-Capillary: 132 mg/dL — ABNORMAL HIGH (ref 65–99)
Glucose-Capillary: 135 mg/dL — ABNORMAL HIGH (ref 65–99)

## 2016-12-20 LAB — CBC
HCT: 27.8 % — ABNORMAL LOW (ref 36.0–46.0)
HEMOGLOBIN: 8.7 g/dL — AB (ref 12.0–15.0)
MCH: 23.5 pg — AB (ref 26.0–34.0)
MCHC: 31.3 g/dL (ref 30.0–36.0)
MCV: 74.9 fL — AB (ref 78.0–100.0)
Platelets: 160 10*3/uL (ref 150–400)
RBC: 3.71 MIL/uL — ABNORMAL LOW (ref 3.87–5.11)
RDW: 16.4 % — ABNORMAL HIGH (ref 11.5–15.5)
WBC: 12.7 10*3/uL — ABNORMAL HIGH (ref 4.0–10.5)

## 2016-12-20 LAB — PREPARE RBC (CROSSMATCH)

## 2016-12-20 LAB — ABO/RH: ABO/RH(D): O POS

## 2016-12-20 LAB — MAGNESIUM: Magnesium: 1.7 mg/dL (ref 1.7–2.4)

## 2016-12-20 SURGERY — LAPAROTOMY, EXPLORATORY
Anesthesia: General | Site: Abdomen

## 2016-12-20 MED ORDER — POTASSIUM CHLORIDE 10 MEQ/100ML IV SOLN
10.0000 meq | INTRAVENOUS | Status: AC
  Administered 2016-12-20: 10 meq via INTRAVENOUS
  Filled 2016-12-20: qty 100

## 2016-12-20 MED ORDER — ONDANSETRON HCL 4 MG/2ML IJ SOLN
INTRAMUSCULAR | Status: AC
  Filled 2016-12-20: qty 4

## 2016-12-20 MED ORDER — SODIUM CHLORIDE 0.9 % IV SOLN: Freq: Once | INTRAVENOUS | Status: DC

## 2016-12-20 MED ORDER — HYDRALAZINE HCL 20 MG/ML IJ SOLN: 10.0000 mg | INTRAMUSCULAR | Status: DC | PRN

## 2016-12-20 MED ORDER — FENTANYL CITRATE (PF) 250 MCG/5ML IJ SOLN
INTRAMUSCULAR | Status: AC
  Filled 2016-12-20: qty 5

## 2016-12-20 MED ORDER — METOPROLOL TARTRATE 5 MG/5ML IV SOLN
2.5000 mg | Freq: Four times a day (QID) | INTRAVENOUS | Status: DC
  Administered 2016-12-20 – 2016-12-24 (×15): 2.5 mg via INTRAVENOUS
  Filled 2016-12-20 (×16): qty 5

## 2016-12-20 MED ORDER — SUGAMMADEX SODIUM 200 MG/2ML IV SOLN
INTRAVENOUS | Status: DC | PRN
  Administered 2016-12-20: 140 mg via INTRAVENOUS

## 2016-12-20 MED ORDER — LACTATED RINGERS IV SOLN: INTRAVENOUS | Status: DC

## 2016-12-20 MED ORDER — 0.9 % SODIUM CHLORIDE (POUR BTL) OPTIME
TOPICAL | Status: DC | PRN
  Administered 2016-12-20 (×2): 1000 mL

## 2016-12-20 MED ORDER — FENTANYL CITRATE (PF) 100 MCG/2ML IJ SOLN
INTRAMUSCULAR | Status: DC | PRN
  Administered 2016-12-20 (×2): 50 ug via INTRAVENOUS

## 2016-12-20 MED ORDER — ONDANSETRON HCL 4 MG/2ML IJ SOLN
INTRAMUSCULAR | Status: DC | PRN
  Administered 2016-12-20: 4 mg via INTRAVENOUS

## 2016-12-20 MED ORDER — HYDROMORPHONE HCL 1 MG/ML IJ SOLN
INTRAMUSCULAR | Status: AC
  Filled 2016-12-20: qty 0.5

## 2016-12-20 MED ORDER — METOPROLOL TARTRATE 5 MG/5ML IV SOLN
INTRAVENOUS | Status: DC | PRN
  Administered 2016-12-20: 1 mg via INTRAVENOUS

## 2016-12-20 MED ORDER — POTASSIUM CHLORIDE 10 MEQ/100ML IV SOLN
10.0000 meq | INTRAVENOUS | Status: AC
  Administered 2016-12-20 (×2): 10 meq via INTRAVENOUS
  Filled 2016-12-20 (×2): qty 100

## 2016-12-20 MED ORDER — HYDROMORPHONE HCL 1 MG/ML IJ SOLN
0.2500 mg | INTRAMUSCULAR | Status: DC | PRN
  Administered 2016-12-20 (×2): 0.25 mg via INTRAVENOUS

## 2016-12-20 MED ORDER — SUGAMMADEX SODIUM 200 MG/2ML IV SOLN
INTRAVENOUS | Status: AC
  Filled 2016-12-20: qty 2

## 2016-12-20 MED ORDER — ROCURONIUM BROMIDE 100 MG/10ML IV SOLN
INTRAVENOUS | Status: DC | PRN
  Administered 2016-12-20: 30 mg via INTRAVENOUS

## 2016-12-20 MED ORDER — BUPIVACAINE HCL (PF) 0.5 % IJ SOLN
INTRAMUSCULAR | Status: AC
  Filled 2016-12-20: qty 10

## 2016-12-20 MED ORDER — PHENYLEPHRINE 40 MCG/ML (10ML) SYRINGE FOR IV PUSH (FOR BLOOD PRESSURE SUPPORT)
PREFILLED_SYRINGE | INTRAVENOUS | Status: AC
  Filled 2016-12-20: qty 10

## 2016-12-20 MED ORDER — PROPOFOL 10 MG/ML IV BOLUS
INTRAVENOUS | Status: DC | PRN
  Administered 2016-12-20: 100 mg via INTRAVENOUS

## 2016-12-20 MED ORDER — KETOROLAC TROMETHAMINE 30 MG/ML IJ SOLN
INTRAMUSCULAR | Status: AC
  Filled 2016-12-20: qty 1

## 2016-12-20 MED ORDER — MORPHINE SULFATE (PF) 4 MG/ML IV SOLN
1.0000 mg | INTRAVENOUS | Status: DC | PRN
  Administered 2016-12-20: 1 mg via INTRAVENOUS
  Administered 2016-12-24: 2 mg via INTRAVENOUS
  Filled 2016-12-20 (×2): qty 1

## 2016-12-20 MED ORDER — CEFOXITIN SODIUM 1 G IV SOLR
INTRAVENOUS | Status: AC
  Filled 2016-12-20: qty 1

## 2016-12-20 MED ORDER — SODIUM CHLORIDE 0.9 % IV BOLUS (SEPSIS)
500.0000 mL | Freq: Once | INTRAVENOUS | Status: AC
  Administered 2016-12-21: 500 mL via INTRAVENOUS

## 2016-12-20 MED ORDER — LACTATED RINGERS IV SOLN
INTRAVENOUS | Status: DC | PRN
  Administered 2016-12-20: 12:00:00 via INTRAVENOUS

## 2016-12-20 MED ORDER — DEXTROSE 5 % IV SOLN
INTRAVENOUS | Status: DC | PRN
  Administered 2016-12-20: 25 ug/min via INTRAVENOUS

## 2016-12-20 MED ORDER — SUCCINYLCHOLINE CHLORIDE 200 MG/10ML IV SOSY
PREFILLED_SYRINGE | INTRAVENOUS | Status: AC
  Filled 2016-12-20: qty 10

## 2016-12-20 MED ORDER — ROCURONIUM BROMIDE 10 MG/ML (PF) SYRINGE
PREFILLED_SYRINGE | INTRAVENOUS | Status: AC
  Filled 2016-12-20: qty 10

## 2016-12-20 MED ORDER — EPHEDRINE 5 MG/ML INJ
INTRAVENOUS | Status: AC
  Filled 2016-12-20: qty 10

## 2016-12-20 MED ORDER — SODIUM CHLORIDE 0.9 % IV SOLN
INTRAVENOUS | Status: DC | PRN
  Administered 2016-12-20: 13:00:00 via INTRAVENOUS

## 2016-12-20 MED ORDER — DEXTROSE 5 % IV SOLN
1.0000 g | INTRAVENOUS | Status: AC
  Administered 2016-12-20: 1 g via INTRAVENOUS
  Filled 2016-12-20: qty 1

## 2016-12-20 MED ORDER — SUCCINYLCHOLINE CHLORIDE 20 MG/ML IJ SOLN
INTRAMUSCULAR | Status: DC | PRN
  Administered 2016-12-20: 80 mg via INTRAVENOUS

## 2016-12-20 MED ORDER — LIDOCAINE 2% (20 MG/ML) 5 ML SYRINGE
INTRAMUSCULAR | Status: AC
  Filled 2016-12-20: qty 15

## 2016-12-20 MED ORDER — LIDOCAINE HCL (CARDIAC) 20 MG/ML IV SOLN
INTRAVENOUS | Status: DC | PRN
  Administered 2016-12-20: 60 mg via INTRAVENOUS

## 2016-12-20 SURGICAL SUPPLY — 39 items
BLADE CLIPPER SURG (BLADE) IMPLANT
CANISTER SUCT 3000ML PPV (MISCELLANEOUS) ×3 IMPLANT
COVER SURGICAL LIGHT HANDLE (MISCELLANEOUS) ×3 IMPLANT
DRAPE LAPAROSCOPIC ABDOMINAL (DRAPES) ×3 IMPLANT
DRAPE WARM FLUID 44X44 (DRAPE) ×3 IMPLANT
DRSG OPSITE POSTOP 4X10 (GAUZE/BANDAGES/DRESSINGS) IMPLANT
DRSG OPSITE POSTOP 4X8 (GAUZE/BANDAGES/DRESSINGS) ×3 IMPLANT
ELECT BLADE 6.5 EXT (BLADE) IMPLANT
ELECT CAUTERY BLADE 6.4 (BLADE) ×3 IMPLANT
ELECT REM PT RETURN 9FT ADLT (ELECTROSURGICAL) ×3
ELECTRODE REM PT RTRN 9FT ADLT (ELECTROSURGICAL) ×1 IMPLANT
GLOVE SURG SIGNA 7.5 PF LTX (GLOVE) ×3 IMPLANT
GOWN STRL REUS W/ TWL LRG LVL3 (GOWN DISPOSABLE) ×1 IMPLANT
GOWN STRL REUS W/ TWL XL LVL3 (GOWN DISPOSABLE) ×1 IMPLANT
GOWN STRL REUS W/TWL LRG LVL3 (GOWN DISPOSABLE) ×2
GOWN STRL REUS W/TWL XL LVL3 (GOWN DISPOSABLE) ×2
KIT BASIN OR (CUSTOM PROCEDURE TRAY) ×3 IMPLANT
KIT ROOM TURNOVER OR (KITS) ×3 IMPLANT
LIGASURE IMPACT 36 18CM CVD LR (INSTRUMENTS) ×3 IMPLANT
NS IRRIG 1000ML POUR BTL (IV SOLUTION) ×6 IMPLANT
PACK GENERAL/GYN (CUSTOM PROCEDURE TRAY) ×3 IMPLANT
PAD ARMBOARD 7.5X6 YLW CONV (MISCELLANEOUS) ×3 IMPLANT
RELOAD PROXIMATE 75MM BLUE (ENDOMECHANICALS) ×9 IMPLANT
SPECIMEN JAR LARGE (MISCELLANEOUS) ×3 IMPLANT
SPONGE LAP 18X18 X RAY DECT (DISPOSABLE) IMPLANT
STAPLER GUN LINEAR PROX 60 (STAPLE) ×3 IMPLANT
STAPLER PROXIMATE 75MM BLUE (STAPLE) ×3 IMPLANT
STAPLER VISISTAT 35W (STAPLE) ×3 IMPLANT
SUCTION POOLE TIP (SUCTIONS) ×3 IMPLANT
SUT PDS AB 1 TP1 96 (SUTURE) ×6 IMPLANT
SUT SILK 2 0 SH CR/8 (SUTURE) ×3 IMPLANT
SUT SILK 2 0 TIES 10X30 (SUTURE) ×3 IMPLANT
SUT SILK 3 0 SH CR/8 (SUTURE) ×6 IMPLANT
SUT SILK 3 0 TIES 10X30 (SUTURE) ×3 IMPLANT
SUT VIC AB 3-0 SH 18 (SUTURE) ×3 IMPLANT
TOWEL OR 17X24 6PK STRL BLUE (TOWEL DISPOSABLE) ×3 IMPLANT
TOWEL OR 17X26 10 PK STRL BLUE (TOWEL DISPOSABLE) ×3 IMPLANT
TRAY FOLEY W/METER SILVER 16FR (SET/KITS/TRAYS/PACK) ×3 IMPLANT
YANKAUER SUCT BULB TIP NO VENT (SUCTIONS) IMPLANT

## 2016-12-20 NOTE — Care Management Note (Signed)
Case Management Note  Patient Details  Name: [place] I[place]ar[place] MRN: [id number] Date of Birth: [birth year]  Subjective/Objective:                    Action/Plan:  Plan exploratory laparotomy today , will continue to follow for discharge needs. Expected Discharge Date:                  Expected Discharge Plan:  Home/Self Care  In-House Referral:     Discharge planning Services     Post Acute Care Choice:    Choice offered to:     DME Arranged:    DME Agency:     HH Arranged:    HH Agency:     Status of Service:  In process, will continue to follow  If discussed at Long Length of Stay Meetings, dates discussed:    Additional Comments:  Kingsley PlanWile, Brean Carberry Marie, RN 12/20/2016, 10:36 AM

## 2016-12-20 NOTE — Anesthesia Preprocedure Evaluation (Addendum)
Anesthesia Evaluation  Patient identified by MRN, date of birth, ID band Patient awake    Reviewed: Allergy & Precautions, H&P , NPO status , Patient's Chart, lab work & pertinent test results  Airway Mallampati: III  TM Distance: >3 FB Neck ROM: Full    Dental no notable dental hx. (+) Chipped, Dental Advisory Given   Pulmonary neg pulmonary ROS,    Pulmonary exam normal breath sounds clear to auscultation       Cardiovascular hypertension, Pt. on medications and Pt. on home beta blockers + CAD, + CABG and + Peripheral Vascular Disease   Rhythm:Regular Rate:Normal     Neuro/Psych negative neurological ROS  negative psych ROS   GI/Hepatic negative GI ROS, Neg liver ROS,   Endo/Other  diabetes, Type 2, Oral Hypoglycemic Agents  Renal/GU   negative genitourinary   Musculoskeletal  (+) Arthritis , Osteoarthritis,    Abdominal   Peds  Hematology negative hematology ROS (+)   Anesthesia Other Findings   Reproductive/Obstetrics negative OB ROS                            Anesthesia Physical Anesthesia Plan  ASA: III  Anesthesia Plan: General   Post-op Pain Management:    Induction: Intravenous, Rapid sequence and Cricoid pressure planned  Airway Management Planned: Oral ETT  Additional Equipment:   Intra-op Plan:   Post-operative Plan: Extubation in OR  Informed Consent: I have reviewed the patients History and Physical, chart, labs and discussed the procedure including the risks, benefits and alternatives for the proposed anesthesia with the patient or authorized representative who has indicated his/her understanding and acceptance.   Dental advisory given  Plan Discussed with: CRNA  Anesthesia Plan Comments:         Anesthesia Quick Evaluation

## 2016-12-20 NOTE — Progress Notes (Signed)
Subjective/Chief Complaint: Still with abdominal pain, no flatus   Objective: Vital signs in last 24 hours: Temp:  [98.2 F (36.8 C)-98.7 F (37.1 C)] 98.7 F (37.1 C) (05/18 0424) Pulse Rate:  [90-98] 90 (05/18 0424) Resp:  [16-17] 17 (05/18 0424) BP: (141-156)/(47-53) 141/53 (05/18 0424) SpO2:  [97 %] 97 % (05/18 0424) Weight:  [55 kg (121 lb 3.2 oz)] 55 kg (121 lb 3.2 oz) (05/17 0808) Last BM Date: 12/19/16  Intake/Output from previous day: 05/17 0701 - 05/18 0700 In: 2478.8 [I.V.:1978.8; IV Piggyback:400] Out: 1750 [Urine:450; Emesis/NG output:1300] Intake/Output this shift: No intake/output data recorded.  Exam: Awake and alert Abdomen with distension, tender with guarding greatest in RLQ  Lab Results:   Recent Labs  12/18/16 0857 12/19/16 0356  WBC 19.8* 17.2*  HGB 9.7* 9.4*  HCT 31.0* 29.9*  PLT 159 159   BMET  Recent Labs  12/18/16 0857 12/19/16 0356  NA 137 143  K 3.7 2.9*  CL 104 104  CO2 21* 25  GLUCOSE 121* 102*  BUN 24* 33*  CREATININE 1.30* 1.62*  CALCIUM 8.5* 8.5*   PT/INR No results for input(s): LABPROT, INR in the last 72 hours. ABG No results for input(s): PHART, HCO3 in the last 72 hours.  Invalid input(s): PCO2, PO2  Studies/Results: Dg Abd 1 View  Result Date: 12/19/2016 CLINICAL DATA:  Follow up small bowel obstruction EXAM: ABDOMEN - 1 VIEW COMPARISON:  12/18/2016 FINDINGS: Contrast material is again noted throughout the small bowel with multiple loops of dilated small bowel consistent with a high-grade small bowel obstruction. No contrast has passed into the colon. Stable bony structures are noted. Nasogastric catheter lies in the distal stomach. IMPRESSION: Stable changes of small-bowel obstruction. Contrast remains scattered throughout small bowel. Electronically Signed   By: Alcide CleverMark  Lukens M.D.   On: 12/19/2016 13:23   Dg Abd Portable 1v-small Bowel Obstruction Protocol-initial, 8 Hr Delay  Result Date:  12/18/2016 CLINICAL DATA:  Small bowel obstruction protocol. 8 hour delayed image. EXAM: PORTABLE ABDOMEN - 1 VIEW COMPARISON:  12/18/2016 at 9:59 a.m. FINDINGS: 8 hours following injection of oral contrast through the nasogastric tube, an AP supine radiograph of the abdomen was acquired. Nasogastric tube curls within distal stomach. There is residual IV contrast within the bladder. IMPRESSION: 1. Moderate to high-grade small bowel obstruction. Contrast has progressed into the small bowel but does not extend into the colon. Contrast primarily is within the jejunum. Electronically Signed   By: Amie Portlandavid  Ormond M.D.   On: 12/18/2016 20:47   Dg Abd Portable 1v-small Bowel Protocol-position Verification  Result Date: 12/18/2016 CLINICAL DATA:  NG tube placement. EXAM: PORTABLE ABDOMEN - 1 VIEW COMPARISON:  CT 12/18/2016. FINDINGS: Prior median sternotomy. NG tube noted with its tip projected over the distal stomach. Porcelain gallbladder and gallstones again noted. Persistent dilated loops of small bowel are noted consistent with small bowel obstruction as previously demonstrated on CT of 12/18/2016 . No free air. Contrast is noted in the bladder. Possible bladder diverticulum present. Aortoiliac atherosclerotic vascular disease. Degenerative changes thoracolumbar spine. IMPRESSION: 1. NG tube noted with its tip projected over the distal stomach. Persistent distended loops of small bowel consistent with small bowel obstruction as previously identified on CT of 12/18/2016. 2.  Porcelain gallbladder/gallstones again noted. 3. Aortoiliac atherosclerotic vascular disease. Electronically Signed   By: Maisie Fushomas  Register   On: 12/18/2016 10:21    Anti-infectives: Anti-infectives    None      Assessment/Plan: SBO  Not improving  with conservative treatment.  I recommend proceeding to the OR for an exploratory laparotomy. I discussed the risks of surgery.  These include but are not limited to bleeding, infection,  injury to surrounding structures, the need for further surgery, the need for bowel resection, cardiopulmonary problems, etc. She agrees to proceed.  LOS: 2 days    Sonya Chavez A 12/20/2016

## 2016-12-20 NOTE — Anesthesia Procedure Notes (Signed)
Procedure Name: Intubation Date/Time: 12/20/2016 12:38 PM Performed by: Tressia Miners LEFFEW Pre-anesthesia Checklist: Patient identified, Patient being monitored, Timeout performed, Emergency Drugs available and Suction available Patient Re-evaluated:Patient Re-evaluated prior to inductionOxygen Delivery Method: Circle System Utilized Preoxygenation: Pre-oxygenation with 100% oxygen Intubation Type: IV induction, Cricoid Pressure applied and Rapid sequence Laryngoscope Size: Mac and 3 Grade View: Grade I Tube type: Oral Tube size: 7.0 mm Number of attempts: 1 Airway Equipment and Method: Stylet Placement Confirmation: ETT inserted through vocal cords under direct vision,  positive ETCO2 and breath sounds checked- equal and bilateral Secured at: 21 cm Tube secured with: Tape Dental Injury: Teeth and Oropharynx as per pre-operative assessment

## 2016-12-20 NOTE — Progress Notes (Signed)
[facility]  PROGRESS NOTE  [place] M Kuhlmann WUJ:[id number] DOB: Aug 20, 1925 DOA: 12/17/2016 PCP: Alysia Penna, MD   Brief HPI:   83 y.o.femalewith medical history significant of DM2, MI. Patient is brought in by family after having AMS this afternoon. Patient was apparently confused and was asking questions repeatedly. She denies any pain, fever, chills, cough. When asked about vomiting says "not too much". She denies abd pain, however family states that she was having abd pain around 4am yesterday when she was up trying to use bathroom. She states she had a small BM at that time. Per family members at bedside current mental status is actually close to baseline.  Patient was seen by general surgery and NG tube has been inserted.    Subjective   This morning patient denies any pain. No nausea or vomiting.   Assessment/Plan:     1. Small bowel obstruction- NG tube in place, abdominal x-ray done today again shows small bowel obstruction. General surgery plans to operate today. 2. Hypokalemia- potassium this morning again 3.0. Will replace potassium IV KCl 10 mg 3.follow BMP in a.m. 3. Acute kidney injury- today creatinine 1.69, will hold IV Lasix, losartan is also hold. Will follow BMP in a.m. 4. 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 5. Diabetes mellitus- blood glucose is well controlled, continue sliding scale insulin with NovoLog 6. Hypertension- blood pressure is well controlled, chlorthalidone on hold, continue  Amlodipine.we'll hold losartan and start metoprolol 2.5 mg IV every 6 hours 7. CAD- stable, continue aspirin 8. Leukocytosis-WBC improving, today 12.7likely reactive chest x-ray negative for pneumonia, UA clear. Patient is afebrile. Will follow CBC in a.m.    DVT prophylaxis: Lovenox  Code Status -full code  Family Communication: Discussed with patient's husband at bedside  Disposition Plan- as per general  surgery   Consultants:  General surgery  Procedures:  None  Continuous infusions . sodium chloride 100 mL/hr at 12/19/16 1631  . sodium chloride    . lactated ringers        Antibiotics:   Anti-infectives    Start     Dose/Rate Route Frequency Ordered Stop   12/20/16 1127  dextrose 5 % with cefOXitin (MEFOXIN) ADS Med  Status:  Discontinued    Comments:  Lorenda Ishihara   : cabinet override      12/20/16 1127 12/20/16 1134   12/20/16 0730  [MAR Hold]  cefOXitin (MEFOXIN) 1 g in dextrose 5 % 50 mL IVPB     (MAR Hold since 12/20/16 1123)   1 g 100 mL/hr over 30 Minutes Intravenous To Short Stay 12/20/16 0715 12/20/16 1309       Objective   Vitals:   12/20/16 1428 12/20/16 1430 12/20/16 1443 12/20/16 1445  BP: (!) 159/76  (!) 160/71   Pulse: 87 89 91 89  Resp: (!) 24 (!) 21 (!) 21 19  Temp:      TempSrc:      SpO2: 90% 93% 96% 97%  Weight:      Height:        Intake/Output Summary (Last 24 hours) at 12/20/16 1526 Last data filed at 12/20/16 1339  Gross per 24 hour  Intake          3304.75 ml  Output             1800 ml  Net          1504.75 ml   Filed Weights   12/19/16 0808  Weight: 55  kg (121 lb 3.2 oz)     Physical Examination:   Physical Exam: Eyes: No icterus, extraocular muscles intact  Mouth: Oral mucosa is moist, no lesions on palate,  Neck: Supple, no deformities, masses, or tenderness Lungs: Normal respiratory effort, bilateral clear to auscultation, no crackles or wheezes.  Heart: Regular rate and rhythm, S1 and S2 normal, no murmurs, rubs auscultated Abdomen: BS normoactive,soft,nondistended,non-tender to palpation,no organomegaly Extremities: No pretibial edema, no erythema, no cyanosis, no clubbing Neuro : Alert and oriented to time, place and person, No focal deficits Skin: No rashes seen on exam    Data Reviewed: I have personally reviewed following labs and imaging studies  CBG:  Recent Labs Lab 12/19/16 2040 12/20/16 0028  12/20/16 0409 12/20/16 0814 12/20/16 1400  GLUCAP 101* 110* 135* 128* 132*    CBC:  Recent Labs Lab 12/17/16 2310 12/18/16 0857 12/19/16 0356 12/20/16 0534  WBC 19.6* 19.8* 17.2* 12.7*  NEUTROABS 16.2*  --   --   --   HGB 9.4* 9.7* 9.4* 8.7*  HCT 29.2* 31.0* 29.9* 27.8*  MCV 74.5* 75.2* 74.6* 74.9*  PLT 167 159 159 160    Basic Metabolic Panel:  Recent Labs Lab 12/17/16 2310 12/18/16 0857 12/19/16 0356 12/20/16 0534 12/20/16 1100  NA 140 137 143 146*  --   K 4.2 3.7 2.9* 3.0*  --   CL 104 104 104 107  --   CO2 24 21* 25 24  --   GLUCOSE 160* 121* 102* 145*  --   BUN 19 24* 33* 43*  --   CREATININE 1.28* 1.30* 1.62* 1.69*  --   CALCIUM 9.0 8.5* 8.5* 8.1*  --   MG  --   --   --   --  1.7    Recent Results (from the past 240 hour(s))  Surgical pcr screen     Status: None   Collection Time: 12/20/16  7:16 AM  Result Value Ref Range Status   MRSA, PCR NEGATIVE NEGATIVE Final   Staphylococcus aureus NEGATIVE NEGATIVE Final    Comment:        The Xpert SA Assay (FDA approved for NASAL specimens in patients over 67 years of age), is one component of a comprehensive surveillance program.  Test performance has been validated by [facility] for patients greater than or equal to 59 year old. It is not intended to diagnose infection nor to guide or monitor treatment.      Liver Function Tests:  Recent Labs Lab 12/17/16 2310  AST 16  ALT 9*  ALKPHOS 52  BILITOT 0.9  PROT 6.2*  ALBUMIN 3.6    Recent Labs Lab 12/17/16 2301  LIPASE 12   No results for input(s): AMMONIA in the last 168 hours.  Cardiac Enzymes: No results for input(s): CKTOTAL, CKMB, CKMBINDEX, TROPONINI in the last 168 hours. BNP (last 3 results) No results for input(s): BNP in the last 8760 hours.  ProBNP (last 3 results) No results for input(s): PROBNP in the last 8760 hours.    Studies: Dg Abd 1 View  Result Date: 12/20/2016 CLINICAL DATA:  Small bowel obstruction  EXAM: ABDOMEN - 1 VIEW COMPARISON:  Yesterday FINDINGS: Nasogastric tube tip overlaps the distal stomach. Unchanged diffuse gaseous and fluid distention of bowel. Previously administered oral contrast is retained within small bowel. No concerning mass effect. No Rigler's sign or other specific pneumoperitoneum finding noted. Some oral contrast is seen within the bladder, possible trans mucosal absorption. IMPRESSION: Ongoing high-grade small bowel  obstruction. Oral contrast is retained within the small bowel. Electronically Signed   By: Marnee SpringJonathon  Watts M.D.   On: 12/20/2016 10:10   Dg Abd 1 View  Result Date: 12/19/2016 CLINICAL DATA:  Follow up small bowel obstruction EXAM: ABDOMEN - 1 VIEW COMPARISON:  12/18/2016 FINDINGS: Contrast material is again noted throughout the small bowel with multiple loops of dilated small bowel consistent with a high-grade small bowel obstruction. No contrast has passed into the colon. Stable bony structures are noted. Nasogastric catheter lies in the distal stomach. IMPRESSION: Stable changes of small-bowel obstruction. Contrast remains scattered throughout small bowel. Electronically Signed   By: Alcide CleverMark  Lukens M.D.   On: 12/19/2016 13:23   Dg Abd Portable 1v-small Bowel Obstruction Protocol-initial, 8 Hr Delay  Result Date: 12/18/2016 CLINICAL DATA:  Small bowel obstruction protocol. 8 hour delayed image. EXAM: PORTABLE ABDOMEN - 1 VIEW COMPARISON:  12/18/2016 at 9:59 a.m. FINDINGS: 8 hours following injection of oral contrast through the nasogastric tube, an AP supine radiograph of the abdomen was acquired. Nasogastric tube curls within distal stomach. There is residual IV contrast within the bladder. IMPRESSION: 1. Moderate to high-grade small bowel obstruction. Contrast has progressed into the small bowel but does not extend into the colon. Contrast primarily is within the jejunum. Electronically Signed   By: Amie Portlandavid  Ormond M.D.   On: 12/18/2016 20:47        Time  spent: 25 min  [facility] HospitalAMA,Tonyetta Berko S   [facility] Pager [phone]. If 7PM-7AM, please contact night-coverage at [email or web address], Office  [phone]  password [email or web address] 12/20/2016, 3:26 PM  LOS: 2 days

## 2016-12-20 NOTE — Op Note (Signed)
EXPLORATORY LAPAROTOMY, SMALL BOWEL RESECTION  Procedure Note  [place] M Varnum 12/17/2016 - 12/20/2016   Pre-op Diagnosis: Small Bowel Obstruction     Post-op Diagnosis: same  Procedure(s): EXPLORATORY LAPAROTOMY SMALL BOWEL RESECTION APPENDECTOMY  Surgeon(s): Abigail Miyamoto, MD  Anesthesia: General  Staff:  Circulator: Islam, Jeanelle Malling, RN Relief Circulator: Woodroe Mode, RN Scrub Person: Carmela Rima Circulator Assistant: Royann Shivers, RN  Estimated Blood Loss: less than 50 mL               Specimens: sent to path  Indications: This is an 83 year old female who presents with a small bowel obstruction. She has not improved with conservative management therefore the decision was made to proceed to the operating room room for exploratory laparotomy.  Findings: The patient was found to have a closed loop small bowel obstruction created by the appendix being fixated down in the pelvis. A proximally 8 inches of infarcted bowel was identified and resected. Incidentally, there was a foreign body that appeared to be consistent with some kind of a piece of plastic or latex it was encountered in the fascia while opening the abdomen.  Procedure: The patient was brought to the operating room and advised correct patient. She is placed upon the operating table general seizures induced. Her abdomen was prepped and draped in usual sterile fashion. I created a midline incision with scalpel going from above the umbilicus toward just above the pubis. A Davis down to the fascia with electrocautery. As I entered the lower part of the incision she is found to have an old foreign body in the abdominal wall. This appeared consistent with either old Penrose drain or some piece of latex. I pulled free from the abdominal wall.  I then opened the peritoneum the entirely of the incision. Upon and abdomen there was hemorrhagic ascites identified. The small bowel is diffusely dilated.  Identified infarcted small bowel in the pelvis. It then became apparent that there was a closed loop bowel obstruction. This was grade from the appendix coming off the colon and then curving and fixating into the pelvis. I did free the tip of the appendix out of the pelvis and this relieved the obstruction. Approximate 8 inches small bowel, however, was infarcted. I transected the small bowel distal and proximal to the area of infarction with a GIA 75 stapler. I then take down the mesentery with the LigaSure.  The small bowels and sent pathology for evaluation. I then reapproximated the distal and proximal small bowel in a side-to-side fashion with interrupted silk sutures. I then created 2 enterotomies and performed a side to side anastomosis with the GIA-75 stapler. I then closed the opening with a TX 60 stapler. I then closed the mesenteric defect with interrupted silk sutures. I then reinforced the staple line with interrupted silk sutures. The anastomosis appeared pink and patent and well perfused. At this point I then decided to go ahead and proceed with appendectomy. I took down the mesoappendix with the LigaSure and then transected the base of the appendix with the GIA-75 stapler. I then irrigated the abdomen with several liters normal saline. Hemostase appear to be achieved. I then closed the patient's midline fascia with a running #1 looped PDS suture. The skin was then irrigated and closed with skin staples. The patient tolerated the procedure well. All the counts were correct at the end of the procedure. The patient was next made in the operating room and taken in a stable condition to the  recovery room.          Sonya Chavez A   Date: 12/20/2016  Time: 1:35 PM

## 2016-12-20 NOTE — Progress Notes (Signed)
Received patient from PACU post surgery for SB resection. Patient is alert and oriented to self, place , able to verbalize that she had a surgery. Not in any distress. NGT connected to suction, foley intact , O2 at 2LPM. Per Pacu , patient received a unit of blood during surgery, last hgb 8.7 and stated that MD verbally stated not to infuse 2 unit PRBC.

## 2016-12-20 NOTE — Transfer of Care (Signed)
Immediate Anesthesia Transfer of Care Note  Patient: Sonya Chavez  Procedure(s) Performed: Procedure(s): EXPLORATORY LAPAROTOMY (N/A) SMALL BOWEL RESECTION (N/A)  Patient Location: PACU  Anesthesia Type:General  Level of Consciousness: awake, alert , patient cooperative and responds to stimulation  Airway & Oxygen Therapy: Patient Spontanous Breathing and Patient connected to nasal cannula oxygen  Post-op Assessment: Report given to RN, Post -op Vital signs reviewed and stable and Patient moving all extremities X 4  Post vital signs: Reviewed and stable  Last Vitals:  Vitals:   12/20/16 1108 12/20/16 1358  BP: (!) 164/60   Pulse: (!) 101   Resp: 20   Temp: 36.8 C (P) 36.1 C    Last Pain:  Vitals:   12/20/16 1108  TempSrc: Oral  PainSc:          Complications: No apparent anesthesia complications

## 2016-12-20 NOTE — Anesthesia Postprocedure Evaluation (Signed)
Anesthesia Post Note  Patient: Sonya Chavez  Procedure(s) Performed: Procedure(s) (LRB): EXPLORATORY LAPAROTOMY (N/A) SMALL BOWEL RESECTION (N/A)  Patient location during evaluation: PACU Anesthesia Type: General Level of consciousness: awake and alert Pain management: pain level controlled Vital Signs Assessment: post-procedure vital signs reviewed and stable Respiratory status: spontaneous breathing, nonlabored ventilation, respiratory function stable and patient connected to nasal cannula oxygen Cardiovascular status: blood pressure returned to baseline and stable Postop Assessment: no signs of nausea or vomiting Anesthetic complications: no       Last Vitals:  Vitals:   12/20/16 1558 12/20/16 1600  BP: (!) 116/59   Pulse: 90 90  Resp: 17 16  Temp:  36.5 C    Last Pain:  Vitals:   12/20/16 1600  TempSrc:   PainSc: Asleep                 Norm Wray,W. EDMOND

## 2016-12-21 ENCOUNTER — Encounter (HOSPITAL_COMMUNITY): Payer: Self-pay | Admitting: Surgery

## 2016-12-21 ENCOUNTER — Inpatient Hospital Stay (HOSPITAL_COMMUNITY): Payer: Medicare Other

## 2016-12-21 LAB — BASIC METABOLIC PANEL
Anion gap: 15 (ref 5–15)
BUN: 48 mg/dL — ABNORMAL HIGH (ref 6–20)
CO2: 19 mmol/L — ABNORMAL LOW (ref 22–32)
CREATININE: 1.75 mg/dL — AB (ref 0.44–1.00)
Calcium: 7.2 mg/dL — ABNORMAL LOW (ref 8.9–10.3)
Chloride: 111 mmol/L (ref 101–111)
GFR calc Af Amer: 29 mL/min — ABNORMAL LOW (ref >60)
GFR, EST NON AFRICAN AMERICAN: 25 mL/min — AB (ref >60)
Glucose, Bld: 132 mg/dL — ABNORMAL HIGH (ref 65–99)
POTASSIUM: 3.6 mmol/L (ref 3.5–5.1)
SODIUM: 145 mmol/L (ref 135–145)

## 2016-12-21 LAB — GLUCOSE, CAPILLARY
GLUCOSE-CAPILLARY: 140 mg/dL — AB (ref 65–99)
GLUCOSE-CAPILLARY: 123 mg/dL — AB (ref 65–99)
GLUCOSE-CAPILLARY: 104 mg/dL — AB (ref 65–99)
Glucose-Capillary: 120 mg/dL — ABNORMAL HIGH (ref 65–99)
Glucose-Capillary: 133 mg/dL — ABNORMAL HIGH (ref 65–99)

## 2016-12-21 LAB — CBC
HCT: 32.3 % — ABNORMAL LOW (ref 36.0–46.0)
Hemoglobin: 10.3 g/dL — ABNORMAL LOW (ref 12.0–15.0)
MCH: 24.4 pg — AB (ref 26.0–34.0)
MCHC: 31.9 g/dL (ref 30.0–36.0)
MCV: 76.5 fL — ABNORMAL LOW (ref 78.0–100.0)
PLATELETS: 160 10*3/uL (ref 150–400)
RBC: 4.22 MIL/uL (ref 3.87–5.11)
RDW: 16.3 % — ABNORMAL HIGH (ref 11.5–15.5)
WBC: 9.7 10*3/uL (ref 4.0–10.5)

## 2016-12-21 MED ORDER — SODIUM CHLORIDE 0.9 % IV BOLUS (SEPSIS)
500.0000 mL | Freq: Once | INTRAVENOUS | Status: AC
  Administered 2016-12-21: 500 mL via INTRAVENOUS

## 2016-12-21 MED ORDER — HEPARIN SODIUM (PORCINE) 5000 UNIT/ML IJ SOLN
5000.0000 [IU] | Freq: Three times a day (TID) | INTRAMUSCULAR | Status: DC
  Administered 2016-12-21 – 2016-12-23 (×7): 5000 [IU] via SUBCUTANEOUS
  Filled 2016-12-21 (×5): qty 1

## 2016-12-21 NOTE — Progress Notes (Signed)
1 Day Post-Op   Subjective/Chief Complaint: Not having many c/o. Says pain is controlled Nurse reports decreased uop overnight. Nurse states pt only had 150 overnight not the 2nd "150cc" as documented this am. Foley replaced bc nurse had concerns it wasn't adequately draining.  Pt recd 1u prbc intraop  Objective: Vital signs in last 24 hours: Temp:  [97 F (36.1 C)-98.2 F (36.8 C)] 97.9 F (36.6 C) (05/19 0539) Pulse Rate:  [82-101] 95 (05/19 0539) Resp:  [13-24] 17 (05/19 0539) BP: (116-166)/(47-76) 131/52 (05/19 0539) SpO2:  [90 %-99 %] 97 % (05/19 0539) Last BM Date: 12/16/16  Intake/Output from previous day: 05/18 0701 - 05/19 0700 In: 2512.7 [I.V.:1986.7; Blood:326; IV Piggyback:200] Out: 2025 [Urine:775; Emesis/NG output:1200; Blood:50] Intake/Output this shift: No intake/output data recorded.  Alert, nontoxic,  cta  Reg Soft, mild approp TTP, dressing c/d/i; no BS. Not really distended No edema, +SCDs  Lab Results:   Recent Labs  12/20/16 0534 12/21/16 0450  WBC 12.7* 9.7  HGB 8.7* 10.3*  HCT 27.8* 32.3*  PLT 160 160   BMET  Recent Labs  12/20/16 0534 12/21/16 0450  NA 146* 145  K 3.0* 3.6  CL 107 111  CO2 24 19*  GLUCOSE 145* 132*  BUN 43* 48*  CREATININE 1.69* 1.75*  CALCIUM 8.1* 7.2*   PT/INR No results for input(s): LABPROT, INR in the last 72 hours. ABG No results for input(s): PHART, HCO3 in the last 72 hours.  Invalid input(s): PCO2, PO2  Studies/Results: Dg Abd 1 View  Result Date: 12/20/2016 CLINICAL DATA:  Small bowel obstruction EXAM: ABDOMEN - 1 VIEW COMPARISON:  Yesterday FINDINGS: Nasogastric tube tip overlaps the distal stomach. Unchanged diffuse gaseous and fluid distention of bowel. Previously administered oral contrast is retained within small bowel. No concerning mass effect. No Rigler's sign or other specific pneumoperitoneum finding noted. Some oral contrast is seen within the bladder, possible trans mucosal absorption.  IMPRESSION: Ongoing high-grade small bowel obstruction. Oral contrast is retained within the small bowel. Electronically Signed   By: Marnee SpringJonathon  Watts M.D.   On: 12/20/2016 10:10   Dg Abd 1 View  Result Date: 12/19/2016 CLINICAL DATA:  Follow up small bowel obstruction EXAM: ABDOMEN - 1 VIEW COMPARISON:  12/18/2016 FINDINGS: Contrast material is again noted throughout the small bowel with multiple loops of dilated small bowel consistent with a high-grade small bowel obstruction. No contrast has passed into the colon. Stable bony structures are noted. Nasogastric catheter lies in the distal stomach. IMPRESSION: Stable changes of small-bowel obstruction. Contrast remains scattered throughout small bowel. Electronically Signed   By: Alcide CleverMark  Lukens M.D.   On: 12/19/2016 13:23    Anti-infectives: Anti-infectives    Start     Dose/Rate Route Frequency Ordered Stop   12/20/16 1127  dextrose 5 % with cefOXitin (MEFOXIN) ADS Med  Status:  Discontinued    Comments:  Lorenda IshiharaGibbs, Bonnie   : cabinet override      12/20/16 1127 12/20/16 1134   12/20/16 0730  cefOXitin (MEFOXIN) 1 g in dextrose 5 % 50 mL IVPB     1 g 100 mL/hr over 30 Minutes Intravenous To Short Stay 12/20/16 0715 12/20/16 1309      Assessment/Plan: S/p Exp lap, SB resection, appendectomy 5/18 Dr Magnus IvanBlackman AKI DM HTN CAD  1200cc out of NG since surgery - bilious, Cr up again slightly this am, and uop down some-->will give gently fluid bolus given above Repeat labs in am Bowel rest, NG tube  pulm  toilet - needs IS, OOB, discussed with nurse PT/OT Start chemical VTE prophylaxis BS ok HTN- BP ok  Mary Sella. Andrey Campanile, MD, FACS General, Bariatric, & Minimally Invasive Surgery [facility], [place]   LOS: 3 days    Atilano Ina 12/21/2016

## 2016-12-21 NOTE — Progress Notes (Signed)
[facility]  PROGRESS NOTE  [place] M Milliner WJX:[id number] DOB: Jan 12, 1926 DOA: 12/17/2016 PCP: Alysia Penna, MD   Brief HPI:   83 y.o.femalewith medical history significant of DM2, MI. Patient is brought in by family after having AMS this afternoon. Patient was apparently confused and was asking questions repeatedly. She denies any pain, fever, chills, cough. When asked about vomiting says "not too much". She denies abd pain, however family states that she was having abd pain around 4am yesterday when she was up trying to use bathroom. She states she had a small BM at that time. Per family members at bedside current mental status is actually close to baseline.  Patient was seen by general surgery and NG tube has been inserted.    Subjective      Patient denies pain, no nausea vomiting. Minimal urine output since last night. Bladder scan showed 2 30 mL of urine, Foley catheter was replaced.   Assessment/Plan:     1. Small bowel obstruction- Status post expiratory laparotomy, small bowel resection, appendectomy. General surgery following 2. Hypokalemia- replete, potassium today 3.6. 3. Acute kidney injury- today creatinine 1.75, minimal urine output 1 50 mL overnight. Will consult nephrology for further evaluation and management. 4. 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 5. Diabetes mellitus- blood glucose is well controlled, continue sliding scale insulin with NovoLog. Glucose 123, 133. 6. Hypertension- blood pressure is well controlled, chlorthalidone on hold, continue  Amlodipine.we'll hold losartan and start metoprolol 2.5 mg IV every 6 hours 7. CAD- stable, continue aspirin 8. Leukocytosis-resolved, today WBC 9.7 likely reactive chest x-ray negative for pneumonia, UA clear. Patient is afebrile. Will follow CBC in a.m.    DVT prophylaxis: Heparin  Code Status -full code  Family Communication: Discussed with patient's  husband at bedside  Disposition Plan- as per general surgery   Consultants:  General surgery  Procedures:  None  Continuous infusions . sodium chloride 100 mL/hr at 12/21/16 1304  . sodium chloride    . lactated ringers        Antibiotics:   Anti-infectives    Start     Dose/Rate Route Frequency Ordered Stop   12/20/16 1127  dextrose 5 % with cefOXitin (MEFOXIN) ADS Med  Status:  Discontinued    Comments:  Lorenda Ishihara   : cabinet override      12/20/16 1127 12/20/16 1134   12/20/16 0730  cefOXitin (MEFOXIN) 1 g in dextrose 5 % 50 mL IVPB     1 g 100 mL/hr over 30 Minutes Intravenous To Short Stay 12/20/16 0715 12/20/16 1309       Objective   Vitals:   12/20/16 1635 12/20/16 2109 12/21/16 0539 12/21/16 1254  BP: (!) 139/53 (!) 127/54 (!) 131/52 (!) 131/50  Pulse: 85 94 95 94  Resp: 16 16 17 18   Temp: 97.7 F (36.5 C) 98.2 F (36.8 C) 97.9 F (36.6 C) 97.7 F (36.5 C)  TempSrc: Oral Oral Oral Oral  SpO2: 96% 98% 97% 98%  Weight:      Height:        Intake/Output Summary (Last 24 hours) at 12/21/16 1731 Last data filed at 12/21/16 1658  Gross per 24 hour  Intake          2686.67 ml  Output             1755 ml  Net           931.67 ml   [employer]  12/19/16 0808  Weight: 55 kg (121 lb 3.2 oz)     Physical Examination:   Physical Exam: Eyes: No icterus, extraocular muscles intact  Mouth: Oral mucosa is moist, no lesions on palate,  Neck: Supple, no deformities, masses, or tenderness Lungs: Normal respiratory effort, bilateral clear to auscultation, no crackles or wheezes.  Heart: Regular rate and rhythm, S1 and S2 normal, no murmurs, rubs auscultated Abdomen: BS normoactive,soft,nondistended,-tender to palpation,no organomegaly Extremities: No pretibial edema, no erythema, no cyanosis, no clubbing Neuro : Alert and oriented to time, place and person, No focal deficits Skin: No rashes seen on exam    Data Reviewed: I have personally  reviewed following labs and imaging studies  CBG:  Recent Labs Lab 12/20/16 2348 12/21/16 0507 12/21/16 0810 12/21/16 1249 12/21/16 1655  GLUCAP 107* 140* 120* 133* 123*    CBC:  Recent Labs Lab 12/17/16 2310 12/18/16 0857 12/19/16 0356 12/20/16 0534 12/21/16 0450  WBC 19.6* 19.8* 17.2* 12.7* 9.7  NEUTROABS 16.2*  --   --   --   --   HGB 9.4* 9.7* 9.4* 8.7* 10.3*  HCT 29.2* 31.0* 29.9* 27.8* 32.3*  MCV 74.5* 75.2* 74.6* 74.9* 76.5*  PLT 167 159 159 160 160    Basic Metabolic Panel:  Recent Labs Lab 12/17/16 2310 12/18/16 0857 12/19/16 0356 12/20/16 0534 12/20/16 1100 12/21/16 0450  NA 140 137 143 146*  --  145  K 4.2 3.7 2.9* 3.0*  --  3.6  CL 104 104 104 107  --  111  CO2 24 21* 25 24  --  19*  GLUCOSE 160* 121* 102* 145*  --  132*  BUN 19 24* 33* 43*  --  48*  CREATININE 1.28* 1.30* 1.62* 1.69*  --  1.75*  CALCIUM 9.0 8.5* 8.5* 8.1*  --  7.2*  MG  --   --   --   --  1.7  --     Recent Results (from the past 240 hour(s))  Surgical pcr screen     Status: None   Collection Time: 12/20/16  7:16 AM  Result Value Ref Range Status   MRSA, PCR NEGATIVE NEGATIVE Final   Staphylococcus aureus NEGATIVE NEGATIVE Final    Comment:        The Xpert SA Assay (FDA approved for NASAL specimens in patients over 44 years of age), is one component of a comprehensive surveillance program.  Test performance has been validated by [facility] for patients greater than or equal to 34 year old. It is not intended to diagnose infection nor to guide or monitor treatment.      Liver Function Tests:  Recent Labs Lab 12/17/16 2310  AST 16  ALT 9*  ALKPHOS 52  BILITOT 0.9  PROT 6.2*  ALBUMIN 3.6    Recent Labs Lab 12/17/16 2301  LIPASE 12   No results for input(s): AMMONIA in the last 168 hours.  Cardiac Enzymes: No results for input(s): CKTOTAL, CKMB, CKMBINDEX, TROPONINI in the last 168 hours. BNP (last 3 results) No results for input(s): BNP in the  last 8760 hours.  ProBNP (last 3 results) No results for input(s): PROBNP in the last 8760 hours.    Studies: Dg Abd 1 View  Result Date: 12/20/2016 CLINICAL DATA:  Small bowel obstruction EXAM: ABDOMEN - 1 VIEW COMPARISON:  Yesterday FINDINGS: Nasogastric tube tip overlaps the distal stomach. Unchanged diffuse gaseous and fluid distention of bowel. Previously administered oral contrast is retained within small bowel. No concerning  mass effect. No Rigler's sign or other specific pneumoperitoneum finding noted. Some oral contrast is seen within the bladder, possible trans mucosal absorption. IMPRESSION: Ongoing high-grade small bowel obstruction. Oral contrast is retained within the small bowel. Electronically Signed   By: Marnee SpringJonathon  Watts M.D.   On: 12/20/2016 10:10        Time spent: 25 min  [facility] Center[facility],GAGAN S   [facility] Pager [phone]. If 7PM-7AM, please contact night-coverage at [email or web address], Office  [phone]  password [email or web address] 12/21/2016, 5:31 PM  LOS: 3 days

## 2016-12-21 NOTE — Progress Notes (Signed)
MD made aware of patient decreased urine output.

## 2016-12-21 NOTE — Evaluation (Signed)
Physical Therapy Evaluation Patient Details Name: Sonya Chavez MRN: [id number] DOB: 1926-03-13 Today's Date: 12/21/2016   History of Present Illness  Pt is an 83 y/o female admitted secondary to AMS, now s/p ex lap for an SBO and appendectomy on 5/18. PMH including but not limited to DM, HTN and MI in 2003.  Clinical Impression  Pt presented sitting OOB in recliner chair, awake and willing to participate in therapy session. Prior to admission, pt reported that she was independent with all functional mobility and ADLs. Pt ambulated in hallway with RW and min guard with mild instability but no LOB or need for physical assistance. PT will continue to follow pt acutely to ensure a safe d/c home.     Follow Up Recommendations No PT follow up;Supervision/Assistance - 24 hour    Equipment Recommendations  None recommended by PT    Recommendations for Other Services       Precautions / Restrictions Precautions Precautions: Fall Restrictions Weight Bearing Restrictions: No      Mobility  Bed Mobility               General bed mobility comments: pt sitting OOB in recliner chair when therapist entered room  Transfers Overall transfer level: Needs assistance Equipment used: Rolling walker (2 wheeled) Transfers: Sit to/from Stand Sit to Stand: Supervision         General transfer comment: increased time, supervision for safety  Ambulation/Gait Ambulation/Gait assistance: Min guard Ambulation Distance (Feet): 150 Feet Assistive device: Rolling walker (2 wheeled) Gait Pattern/deviations: Step-through pattern;Decreased stride length;Trunk flexed Gait velocity: decreased   General Gait Details: slow, steady gait, close min guard for safety and occasional vc'ing to maintain a safe distance from Sonya Chavez Mobility    Modified Rankin (Stroke Patients Only)       Balance Overall balance assessment: Needs assistance Sitting-balance  support: Feet supported Sitting balance-Leahy Scale: Good     Standing balance support: During functional activity;No upper extremity supported Standing balance-Leahy Scale: Fair                               Pertinent Vitals/Pain Pain Assessment: No/denies pain    Home Living Family/patient expects to be discharged to:: Private residence Living Arrangements: Spouse/significant other Available Help at Discharge: Family;Available 24 hours/day Type of Home: House Home Access: Stairs to enter Entrance Stairs-Rails: Doctor, general practice of Steps: 3 Home Layout: One level Home Equipment: Environmental consultant - 2 wheels      Prior Function Level of Independence: Independent         Comments: pt's spouse drives her wherever she needs to go     Hand Dominance        Extremity/Trunk Assessment   Upper Extremity Assessment Upper Extremity Assessment: Overall WFL for tasks assessed    Lower Extremity Assessment Lower Extremity Assessment: Overall WFL for tasks assessed    Cervical / Trunk Assessment Cervical / Trunk Assessment: Kyphotic  Communication   Communication: No difficulties  Cognition Arousal/Alertness: Awake/alert Behavior During Therapy: WFL for tasks assessed/performed Overall Cognitive Status: Within Functional Limits for tasks assessed                                        General Comments      Exercises  Assessment/Plan    PT Assessment Patient needs continued PT services  PT Problem List Decreased balance;Decreased mobility;Decreased coordination;Decreased knowledge of use of DME;Decreased safety awareness       PT Treatment Interventions DME instruction;Gait training;Stair training;Therapeutic activities;Functional mobility training;Balance training;Therapeutic exercise;Neuromuscular re-education;Patient/family education    PT Goals (Current goals can be found in the Care Plan section)  Acute Rehab PT  Goals Patient Stated Goal: return home and to PLOF PT Goal Formulation: With patient Time For Goal Achievement: 01/04/17 Potential to Achieve Goals: Good    Frequency Min 3X/week   Barriers to discharge        Co-evaluation               AM-PAC PT "6 Clicks" Daily Activity  Outcome Measure Difficulty turning over in bed (including adjusting bedclothes, sheets and blankets)?: A Little Difficulty moving from lying on back to sitting on the side of the bed? : A Little Difficulty sitting down on and standing up from a chair with arms (e.g., wheelchair, bedside commode, etc,.)?: A Little Help needed moving to and from a bed to chair (including a wheelchair)?: A Little Help needed walking in hospital room?: A Little Help needed climbing 3-5 steps with a railing? : A Lot 6 Click Score: 17    End of Session Equipment Utilized During Treatment: Gait belt Activity Tolerance: Patient tolerated treatment well Patient left: in chair;with call bell/phone within reach;with family/visitor present Nurse Communication: Mobility status PT Visit Diagnosis: Unsteadiness on feet (R26.81);Other abnormalities of gait and mobility (R26.89)    Time: [id number] PT Time Calculation (min) (ACUTE ONLY): 24 min   Charges:   PT Evaluation $PT Eval Moderate Complexity: 1 Procedure PT Treatments $Gait Training: 8-22 mins   PT G Codes:        [place]otvilleJennifer Dunya Meiners, PT, DPT [phone]   Alessandra BevelsJennifer M Tierney Behl 12/21/2016, 3:08 PM

## 2016-12-21 NOTE — Consult Note (Signed)
Renal Service Consult Note [facility]  [name] 12/21/2016 Sonya Chavez Requesting Physician:  Dr Darrick Meigs  Reason for Consult:  Acute on CKD3 HPI: The patient is a 83 y.o. year-old with history of HTN, DM2, MI/ CAD, HL and DJD presented on 5/16 with abd pain and AMS on 5/16.  CT abd showed SBO and possible inflammatory stricture in the ileum. admitted , NPO, IVF. She did not improve w conservative measures and was taken to the OR on 5/18 for exlap with small bowel resection and appy.  Findings were a closed loop small bowel obstruction/ infarction created by the appendix being fixated down in the pelvis.  8 inches of infarcted bowel were removed.  Over the last 48 hrs UOP has dropped off and creat has increased from 1.28 on admission, to 1.62, then 1.69 yest and 1.75 today.  Asked to see for A/C renal failure.    Pt got IV contrast 100 cc on 5/16 w/ abd CT.  Was on ARB , stopped yest.  No hypotension and no HTN.    Pt w/o complaints, has NG in, not eating.  No SOB or cough.     Inpaitent meds > asa, norvasc, gastrografin, lovenox, lasix 20 mg IV once, neurontin, sq hep, dilaudid, novolog, Isovue 5/16, losartan (dc'd today), lopressor 2.5 mg IV every 6 hrs for last 24 hrs; also KCl, cefoxitin, ms04  Cumulative I/O's are 7.1 L in and 6.9 L out.          ROS  denies CP  no joint pain   no HA  no blurry vision  no rash  no diarrhea  no nausea/ vomiting    Past Medical History  Past Medical History:  Diagnosis Date  . Arthritis    "qwhere"  . Coronary artery disease   . Hypercholesteremia   . Hypertension   . Myocardial infarction ([place]) 2003  . SBO (small bowel obstruction) ([place]) 12/18/2016  . Stenosis of subclavian artery ([place])   . Type II diabetes mellitus ([place])    Past Surgical History  Past Surgical History:  Procedure Laterality Date  . ABDOMINAL HYSTERECTOMY  1970  . BOWEL RESECTION N/A 12/20/2016   Procedure: SMALL BOWEL RESECTION;   Surgeon: Coralie Keens, MD;  Location: [place];  Service: General;  Laterality: N/A;  . CARDIAC CATHETERIZATION    . CATARACT EXTRACTION Bilateral 2000's  . CORONARY ARTERY BYPASS GRAFT  2003   in [place], [place]  . LAPAROTOMY N/A 12/20/2016   Procedure: EXPLORATORY LAPAROTOMY;  Surgeon: Coralie Keens, MD;  Location: [place];  Service: General;  Laterality: N/A;  . LEFT HEART CATHETERIZATION WITH CORONARY/GRAFT ANGIOGRAM N/A 09/27/2014   Procedure: LEFT HEART CATHETERIZATION WITH Beatrix Fetters;  Surgeon: Laverda Page, MD;  Location[facility] CATH LAB;  Service: Cardiovascular;  Laterality: N/A;  . UNILATERAL UPPER EXTREMEITY ANGIOGRAM N/A 09/27/2014   Procedure: SUBCLAVIAN ARTERIOGRAM ;  Surgeon: Laverda Page, MD;  Location: [facility] CATH LAB;  Service: Cardiovascular;  Laterality: N/A;  . UNILATERAL UPPER EXTREMEITY ANGIOGRAM N/A 11/09/2014   Procedure: Left subclavian stent ;  Surgeon: Adrian Prows, MD;  Location: [facility] CATH LAB;  Service: Cardiovascular;  Laterality: N/A;  . VISCERAL ANGIOGRAM N/A 10/25/2014   Procedure: Blanchard Kelch;  Surgeon: Adrian Prows, MD;  Location[facility] CATH LAB;  Service: Cardiovascular;  Laterality: N/A;   Family History  Family History  Problem Relation Age of Onset  . Gallbladder disease Neg Hx    Social History  reports that  she has never smoked. She has never used smokeless tobacco. She reports that she does not drink alcohol or use drugs. Allergies  Allergies  Allergen Reactions  . Crestor [Rosuvastatin] Other (See Comments)    Myalgias  . Lipitor [Atorvastatin] Other (See Comments)    myalgias  . Zocor [Simvastatin] Other (See Comments)    myalgias   Home medications Prior to Admission medications   Medication Sig Start Date End Date Taking? Authorizing Provider  alendronate (FOSAMAX) 70 MG tablet Take 70 mg by mouth every Monday.  12/08/14  Yes [provider]  amLODipine (NORVASC) 10 MG tablet Take 10 mg by mouth at bedtime.  10/12/14   Yes [provider]  aspirin EC 81 MG tablet Take 81 mg by mouth at bedtime.    Yes [provider]  Calcium Citrate-Vitamin D (CALCIUM CITRATE + PO) Take 1 tablet by mouth daily.    Yes [provider]  carvedilol (COREG) 6.25 MG tablet Take 12.5 mg by mouth 2 (two) times daily with a meal.    Yes [provider]  chlorthalidone (HYGROTON) 25 MG tablet Take 1 tablet (25 mg total) by mouth daily. 09/29/14  Yes Adrian Prows, MD  Cholecalciferol (VITAMIN D3) 2000 UNITS capsule Take 8,000 Units by mouth daily.    Yes [provider]  gabapentin (NEURONTIN) 100 MG capsule Take 200 mg by mouth at bedtime.   Yes [provider]  losartan (COZAAR) 100 MG tablet Take 100 mg by mouth daily. 12/08/14  Yes [provider]  metFORMIN (GLUCOPHAGE) 1000 MG tablet Take 1 tablet (1,000 mg total) by mouth 2 (two) times daily with a meal. 11/11/14  Yes Adrian Prows, MD  acetaminophen (TYLENOL) 325 MG tablet Take 650 mg by mouth daily as needed (pain).    [provider]   Liver Function Tests  Recent Labs Lab 12/17/16 2310  AST 16  ALT 9*  ALKPHOS 52  BILITOT 0.9  PROT 6.2*  ALBUMIN 3.6    Recent Labs Lab 12/17/16 2301  LIPASE 12   CBC  Recent Labs Lab 12/17/16 2310  12/19/16 0356 12/20/16 0534 12/21/16 0450  WBC 19.6*  < > 17.2* 12.7* 9.7  NEUTROABS 16.2*  --   --   --   --   HGB 9.4*  < > 9.4* 8.7* 10.3*  HCT 29.2*  < > 29.9* 27.8* 32.3*  MCV 74.5*  < > 74.6* 74.9* 76.5*  PLT 167  < > 159 160 160  < > = values in this interval not displayed. Basic Metabolic Panel  Recent Labs Lab 12/17/16 2310 12/18/16 0857 12/19/16 0356 12/20/16 0534 12/21/16 0450  NA 140 137 143 146* 145  K 4.2 3.7 2.9* 3.0* 3.6  CL 104 104 104 107 111  CO2 24 21* 25 24 19*  GLUCOSE 160* 121* 102* 145* 132*  BUN 19 24* 33* 43* 48*  CREATININE 1.28* 1.30* 1.62* 1.69* 1.75*  CALCIUM 9.0 8.5* 8.5* 8.1* 7.2*   Iron/TIBC/Ferritin/ %Sat No  results found for: IRON, TIBC, FERRITIN, IRONPCTSAT  Vitals:   12/20/16 1635 12/20/16 2109 12/21/16 0539 12/21/16 1254  BP: (!) 139/53 (!) 127/54 (!) 131/52 (!) 131/50  Pulse: 85 94 95 94  Resp: _0 Temp: 97.7 F (36.5 C) 98.2 F (36.8 C) 97.9 F (36.6 C) 97.7 F (36.5 C)  TempSrc: Oral Oral Oral Oral  SpO2: 96% 98% 97% 98%  Weight:      Height:  Exam Gen alert, NG in place, looks tired and ill No rash, cyanosis or gangrene Sclera anicteric, throat clear   No jvd or bruits Chest clear bilat RRR no MRG Abd soft ntnd no mass or ascites +bs GU foley cath in place MS no joint effusions or deformity Ext 1+ pretib  edema / no wounds or ulcers Neuro is alert, Ox 3 , nf  UA > 0-5 rbc/ wbc per hpf, prot 100, pH 5.0, small Hgb CT abd 5/16 with contrast >> acute SBO, poss inflamm stricture distal ileum; porcelain GB w stones, renal cysts, lumbar DJD, atherosclerosis CXR 5/15 - low lung vol's w atx bilat, post CABG  Date   Creat  eGFR 2014   0.9- 1.1 2016    1.1- 1.08 Oct 2015  1.28  42 Dec 17, 2016  1.28  42 May 16   1.3 May 17   1.62 May 18   1.69 Dec 21, 2016  1.75  29    Assessment: 1  Acute on CKD3 - due to ATN/ IV contrast w/ ARB's.  Get renal US. ARB dc'd.  2  Volume - doesn't appear dry, poss slightly excess volume 3  SBO, sp exlap w/ SB resection due to infarction 4  Hx CAD/ CABG 5  DM 2 on insulin here 6  HTN bp's good now   Plan - decrease IVF's, get CXR, renal US, f/u creat in am  Kelly Splinter MD Welcome pager [phone]   12/21/2016, 5:49 PM

## 2016-12-21 NOTE — Progress Notes (Signed)
Patient continue with low urine output . Md made aware of the urine output since am, bladder scan showing 232 ml urine. Md notified. Will continue to monitor.

## 2016-12-22 DIAGNOSIS — N179 Acute kidney failure, unspecified: Secondary | ICD-10-CM

## 2016-12-22 LAB — BASIC METABOLIC PANEL
Anion gap: 17 — ABNORMAL HIGH (ref 5–15)
BUN: 46 mg/dL — ABNORMAL HIGH (ref 6–20)
CHLORIDE: 111 mmol/L (ref 101–111)
CO2: 23 mmol/L (ref 22–32)
CREATININE: 1.54 mg/dL — AB (ref 0.44–1.00)
Calcium: 7.1 mg/dL — ABNORMAL LOW (ref 8.9–10.3)
GFR calc non Af Amer: 29 mL/min — ABNORMAL LOW (ref >60)
GFR, EST AFRICAN AMERICAN: 33 mL/min — AB (ref >60)
Glucose, Bld: 124 mg/dL — ABNORMAL HIGH (ref 65–99)
Potassium: 2.8 mmol/L — ABNORMAL LOW (ref 3.5–5.1)
Sodium: 151 mmol/L — ABNORMAL HIGH (ref 135–145)

## 2016-12-22 LAB — GLUCOSE, CAPILLARY
GLUCOSE-CAPILLARY: 130 mg/dL — AB (ref 65–99)
GLUCOSE-CAPILLARY: 138 mg/dL — AB (ref 65–99)
Glucose-Capillary: 107 mg/dL — ABNORMAL HIGH (ref 65–99)
Glucose-Capillary: 128 mg/dL — ABNORMAL HIGH (ref 65–99)
Glucose-Capillary: 156 mg/dL — ABNORMAL HIGH (ref 65–99)
Glucose-Capillary: 172 mg/dL — ABNORMAL HIGH (ref 65–99)

## 2016-12-22 LAB — CBC
HCT: 28 % — ABNORMAL LOW (ref 36.0–46.0)
Hemoglobin: 8.9 g/dL — ABNORMAL LOW (ref 12.0–15.0)
MCH: 24.5 pg — ABNORMAL LOW (ref 26.0–34.0)
MCHC: 31.8 g/dL (ref 30.0–36.0)
MCV: 76.9 fL — AB (ref 78.0–100.0)
Platelets: 153 10*3/uL (ref 150–400)
RBC: 3.64 MIL/uL — AB (ref 3.87–5.11)
RDW: 16.7 % — ABNORMAL HIGH (ref 11.5–15.5)
WBC: 9.6 10*3/uL (ref 4.0–10.5)

## 2016-12-22 LAB — SODIUM, URINE, RANDOM: Sodium, Ur: 30 mmol/L

## 2016-12-22 LAB — CREATININE, URINE, RANDOM: CREATININE, URINE: 78.46 mg/dL

## 2016-12-22 MED ORDER — MAGNESIUM SULFATE 2 GM/50ML IV SOLN
2.0000 g | Freq: Once | INTRAVENOUS | Status: AC
  Administered 2016-12-22: 2 g via INTRAVENOUS
  Filled 2016-12-22: qty 50

## 2016-12-22 MED ORDER — DEXTROSE 5 % IV SOLN
INTRAVENOUS | Status: AC
  Administered 2016-12-22 – 2016-12-23 (×3): via INTRAVENOUS

## 2016-12-22 MED ORDER — POTASSIUM CHLORIDE 10 MEQ/100ML IV SOLN
10.0000 meq | INTRAVENOUS | Status: AC
  Administered 2016-12-22 (×5): 10 meq via INTRAVENOUS
  Filled 2016-12-22: qty 100

## 2016-12-22 NOTE — Progress Notes (Signed)
[facility]  PROGRESS NOTE  [place] M Lilja ZOX:[id number] DOB: 10-12-1925 DOA: 12/17/2016 PCP: Alysia Penna, MD   Brief HPI:   83 y.o.femalewith medical history significant of DM2, MI. Patient is brought in by family after having AMS this afternoon. Patient was apparently confused and was asking questions repeatedly. She denies any pain, fever, chills, cough. When asked about vomiting says "not too much". She denies abd pain, however family states that she was having abd pain around 4am yesterday when she was up trying to use bathroom. She states she had a small BM at that time. Per family members at bedside current mental status is actually close to baseline.  Patient was seen by general surgery and NG tube has been inserted.    Subjective   Patient seen and examined, denies abdominal pain. No flatus yet.   Assessment/Plan:     1. Small bowel obstruction- Status post expiratory laparotomy, small bowel resection, appendectomy. General surgery following. 2. Hypokalemia- Potassium is low at 2.8, will give 10 meq of IV KCl 5 3. Acute kidney injury- today creatinine is improving at 1.54 appreciate nephrology input. 4. Hypernatremia- sodium is 151, started on D5W at 100 mL per hour but nephrology. Follow BMP in a.m. 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 6. Diabetes mellitus- blood glucose is well controlled, continue sliding scale insulin with NovoLog. Glucose 128,138 7. Hypertension- blood pressure is well controlled, chlorthalidone on hold, continue  Amlodipine and losartan is on hold started on  metoprolol 2.5 mg IV every 6 hours 8. CAD- stable, continue aspirin 9. Leukocytosis-resolved, today WBC 9.7 likely reactive chest x-ray negative for pneumonia, UA clear. Patient is afebrile.     DVT prophylaxis: Heparin  Code Status -full code  Family Communication: Discussed with patient's husband at bedside  Disposition  Plan- as per general surgery   Consultants:  General surgery  Procedures:  None  Continuous infusions . sodium chloride    . dextrose 100 mL/hr at 12/22/16 1200  . lactated ringers        Antibiotics:   Anti-infectives    Start     Dose/Rate Route Frequency Ordered Stop   12/20/16 1127  dextrose 5 % with cefOXitin (MEFOXIN) ADS Med  Status:  Discontinued    Comments:  Lorenda Ishihara   : cabinet override      12/20/16 1127 12/20/16 1134   12/20/16 0730  cefOXitin (MEFOXIN) 1 g in dextrose 5 % 50 mL IVPB     1 g 100 mL/hr over 30 Minutes Intravenous To Short Stay 12/20/16 0715 12/20/16 1309       Objective   Vitals:   12/21/16 0539 12/21/16 1254 12/21/16 2207 12/22/16 0611  BP: (!) 131/52 (!) 131/50 (!) 144/59 (!) 146/49  Pulse: 95 94 91 84  Resp: 17 18 17 17   Temp: 97.9 F (36.6 C) 97.7 F (36.5 C) 98.3 F (36.8 C) 97.7 F (36.5 C)  TempSrc: Oral Oral Oral Oral  SpO2: 97% 98% 99% 98%  Weight:      Height:        Intake/Output Summary (Last 24 hours) at 12/22/16 1518 Last data filed at 12/22/16 8119  Gross per 24 hour  Intake              825 ml  Output             1605 ml  Net             -780 ml  Filed Weights   12/19/16 0808  Weight: 55 kg (121 lb 3.2 oz)     Physical Examination:   Physical Exam: Eyes: No icterus, extraocular muscles intact  Mouth: Oral mucosa is moist, no lesions on palate,  Neck: Supple, no deformities, masses, or tenderness Lungs: Normal respiratory effort, bilateral clear to auscultation, no crackles or wheezes.  Heart: Regular rate and rhythm, S1 and S2 normal, no murmurs, rubs auscultated Abdomen: BS normoactive,soft,nondistended,non-tender to palpation,no organomegaly Extremities: No pretibial edema, no erythema, no cyanosis, no clubbing Neuro : Alert and oriented to time, place and person, No focal deficits Skin: No rashes seen on exam    Data Reviewed: I have personally reviewed following labs and imaging  studies  CBG:  Recent Labs Lab 12/21/16 2012 12/22/16 0010 12/22/16 0406 12/22/16 0810 12/22/16 1220  GLUCAP 104* 107* 130* 138* 128*    CBC:  Recent Labs Lab 12/17/16 2310 12/18/16 0857 12/19/16 0356 12/20/16 0534 12/21/16 0450 12/22/16 0424  WBC 19.6* 19.8* 17.2* 12.7* 9.7 9.6  NEUTROABS 16.2*  --   --   --   --   --   HGB 9.4* 9.7* 9.4* 8.7* 10.3* 8.9*  HCT 29.2* 31.0* 29.9* 27.8* 32.3* 28.0*  MCV 74.5* 75.2* 74.6* 74.9* 76.5* 76.9*  PLT 167 159 159 160 160 153    Basic Metabolic Panel:  Recent Labs Lab 12/18/16 0857 12/19/16 0356 12/20/16 0534 12/20/16 1100 12/21/16 0450 12/22/16 0424  NA 137 143 146*  --  145 151*  K 3.7 2.9* 3.0*  --  3.6 2.8*  CL 104 104 107  --  111 111  CO2 21* 25 24  --  19* 23  GLUCOSE 121* 102* 145*  --  132* 124*  BUN 24* 33* 43*  --  48* 46*  CREATININE 1.30* 1.62* 1.69*  --  1.75* 1.54*  CALCIUM 8.5* 8.5* 8.1*  --  7.2* 7.1*  MG  --   --   --  1.7  --   --     Recent Results (from the past 240 hour(s))  Surgical pcr screen     Status: None   Collection Time: 12/20/16  7:16 AM  Result Value Ref Range Status   MRSA, PCR NEGATIVE NEGATIVE Final   Staphylococcus aureus NEGATIVE NEGATIVE Final    Comment:        The Xpert SA Assay (FDA approved for NASAL specimens in patients over 79 years of age), is one component of a comprehensive surveillance program.  Test performance has been validated by [facility] for patients greater than or equal to 70 year old. It is not intended to diagnose infection nor to guide or monitor treatment.      Liver Function Tests:  Recent Labs Lab 12/17/16 2310  AST 16  ALT 9*  ALKPHOS 52  BILITOT 0.9  PROT 6.2*  ALBUMIN 3.6    Recent Labs Lab 12/17/16 2301  LIPASE 12   No results for input(s): AMMONIA in the last 168 hours.  Cardiac Enzymes: No results for input(s): CKTOTAL, CKMB, CKMBINDEX, TROPONINI in the last 168 hours. BNP (last 3 results) No results for  input(s): BNP in the last 8760 hours.  ProBNP (last 3 results) No results for input(s): PROBNP in the last 8760 hours.    Studies: US Renal  Result Date: 12/21/2016 CLINICAL DATA:  Acute kidney injury EXAM: RENAL / URINARY TRACT ULTRASOUND COMPLETE COMPARISON:  CT abdomen/ pelvis dated 12/18/2016 FINDINGS: Right Kidney: Length: Approximately 8.1 cm. Poorly visualized.  No gross hydronephrosis. Left Kidney: Length: 8.9 cm.  No mass or hydronephrosis. Bladder: Not discretely visualized.  Indwelling catheter. Additional comments: Cholelithiasis with mural wall calcifications, better evaluated on CT. Mild pericholecystic fluid. IMPRESSION: Kidneys are poorly evaluated.  No hydronephrosis. Bladder not discretely visualized.  Indwelling catheter. Cholelithiasis with mural calcifications and mild pericholecystic fluid, better evaluated on recent CT. Electronically Signed   By: Charline BillsSriyesh  Krishnan M.D.   On: 12/21/2016 21:48   Dg Chest Port 1 View  Result Date: 12/21/2016 CLINICAL DATA:  Acute renal injury EXAM: PORTABLE CHEST 1 VIEW COMPARISON:  12/17/2016 FINDINGS: Cardiac shadow is mildly enlarged. Nasogastric catheter is noted within the stomach. Postsurgical changes are seen. The lungs are hypoinflated with some mild left basilar atelectasis slightly increased from the prior exam. No other focal abnormality is noted. IMPRESSION: Increasing left basilar atelectasis with poor inspiratory effort. Electronically Signed   By: Alcide CleverMark  Lukens M.D.   On: 12/21/2016 19:13        Time spent: 25 min  [facility],Gwenlyn Hottinger S   [facility] Pager [phone]. If 7PM-7AM, please contact night-coverage at [email or web address], Office  [phone]  password [email or web address] 12/22/2016, 3:18 PM  LOS: 4 days

## 2016-12-22 NOTE — Progress Notes (Signed)
2 Days Post-Op   Subjective/Chief Complaint: No c/o. No nausea. No flatus. Spent time in chair yesterday.    Objective: Vital signs in last 24 hours: Temp:  [97.7 F (36.5 C)-98.3 F (36.8 C)] 97.7 F (36.5 C) (05/20 0611) Pulse Rate:  [84-94] 84 (05/20 0611) Resp:  [17-18] 17 (05/20 0611) BP: (131-146)/(49-59) 146/49 (05/20 0611) SpO2:  [98 %-99 %] 98 % (05/20 0611) Last BM Date: 12/16/16  Intake/Output from previous day: 05/19 0701 - 05/20 0700 In: 1925 [I.V.:1865; NG/GT:60] Out: 2155 [Urine:655; Emesis/NG output:1500] Intake/Output this shift: No intake/output data recorded.  Awake, alert, nontoxic cta  Reg Soft, nd, mild approp TTP, incision ok. +BS; 1500 NG/24hrs - bile No edema Lab Results:   Recent Labs  12/21/16 0450 12/22/16 0424  WBC 9.7 9.6  HGB 10.3* 8.9*  HCT 32.3* 28.0*  PLT 160 153   BMET  Recent Labs  12/21/16 0450 12/22/16 0424  NA 145 151*  K 3.6 2.8*  CL 111 111  CO2 19* 23  GLUCOSE 132* 124*  BUN 48* 46*  CREATININE 1.75* 1.54*  CALCIUM 7.2* 7.1*   PT/INR No results for input(s): LABPROT, INR in the last 72 hours. ABG No results for input(s): PHART, HCO3 in the last 72 hours.  Invalid input(s): PCO2, PO2  Studies/Results: Dg Abd 1 View  Result Date: 12/20/2016 CLINICAL DATA:  Small bowel obstruction EXAM: ABDOMEN - 1 VIEW COMPARISON:  Yesterday FINDINGS: Nasogastric tube tip overlaps the distal stomach. Unchanged diffuse gaseous and fluid distention of bowel. Previously administered oral contrast is retained within small bowel. No concerning mass effect. No Rigler's sign or other specific pneumoperitoneum finding noted. Some oral contrast is seen within the bladder, possible trans mucosal absorption. IMPRESSION: Ongoing high-grade small bowel obstruction. Oral contrast is retained within the small bowel. Electronically Signed   By: Marnee Spring Chavez.D.   On: 12/20/2016 10:10   US Renal  Result Date: 12/21/2016 CLINICAL DATA:   Acute kidney injury EXAM: RENAL / URINARY TRACT ULTRASOUND COMPLETE COMPARISON:  CT abdomen/ pelvis dated 12/18/2016 FINDINGS: Right Kidney: Length: Approximately 8.1 cm. Poorly visualized. No gross hydronephrosis. Left Kidney: Length: 8.9 cm.  No mass or hydronephrosis. Bladder: Not discretely visualized.  Indwelling catheter. Additional comments: Cholelithiasis with mural wall calcifications, better evaluated on CT. Mild pericholecystic fluid. IMPRESSION: Kidneys are poorly evaluated.  No hydronephrosis. Bladder not discretely visualized.  Indwelling catheter. Cholelithiasis with mural calcifications and mild pericholecystic fluid, better evaluated on recent CT. Electronically Signed   By: Charline Bills Chavez.D.   On: 12/21/2016 21:48   Dg Chest Port 1 View  Result Date: 12/21/2016 CLINICAL DATA:  Acute renal injury EXAM: PORTABLE CHEST 1 VIEW COMPARISON:  12/17/2016 FINDINGS: Cardiac shadow is mildly enlarged. Nasogastric catheter is noted within the stomach. Postsurgical changes are seen. The lungs are hypoinflated with some mild left basilar atelectasis slightly increased from the prior exam. No other focal abnormality is noted. IMPRESSION: Increasing left basilar atelectasis with poor inspiratory effort. Electronically Signed   By: Alcide Clever Chavez.D.   On: 12/21/2016 19:13    Anti-infectives: Anti-infectives    Start     Dose/Rate Route Frequency Ordered Stop   12/20/16 1127  dextrose 5 % with cefOXitin (MEFOXIN) ADS Med  Status:  Discontinued    Comments:  Sonya Chavez   : cabinet override      12/20/16 1127 12/20/16 1134   12/20/16 0730  cefOXitin (MEFOXIN) 1 g in dextrose 5 % 50 mL IVPB  1 g 100 mL/hr over 30 Minutes Intravenous To Short Stay 12/20/16 0715 12/20/16 1309      Assessment/Plan: S/p Exp lap, SB resection, appendectomy 5/18 Dr Magnus IvanBlackman AKI DM HTN CAD Acute on chronic anemia Hypernatremia  Hypokalemia  Creatinine better, uop better, IVF were decreased - renal  following Will need to monitor I/o since pt having High NG output Electrolyte mgmt per primary team Cont bowel rest, NG tube to LIWS, ice chips PT/OT Cont chemical vte prophylaxis Repeat CBC Monday to monitor hgb  Mary SellaEric Chavez. Andrey CampanileWilson, MD, FACS General, Bariatric, & Minimally Invasive Surgery [facility], [place]   LOS: 4 days    Sonya Chavez,Sonya Chavez 12/22/2016

## 2016-12-22 NOTE — Progress Notes (Signed)
  [facility] Progress Note   Subjective: feeling better, creat down today  Vitals:   12/21/16 0539 12/21/16 1254 12/21/16 2207 12/22/16 0611  BP: (!) 131/52 (!) 131/50 (!) 144/59 (!) 146/49  Pulse: 95 94 91 84  Resp: 17 18 17 17   Temp: 97.9 F (36.6 C) 97.7 F (36.5 C) 98.3 F (36.8 C) 97.7 F (36.5 C)  TempSrc: Oral Oral Oral Oral  SpO2: 97% 98% 99% 98%  Weight:      Height:        Inpatient medications: . amLODipine  10 mg Oral QHS  . aspirin EC  81 mg Oral QHS  . gabapentin  200 mg Oral QHS  . heparin subcutaneous  5,000 Units Subcutaneous Q8H  . insulin aspart  0-9 Units Subcutaneous Q4H  . mouth rinse  15 mL Mouth Rinse BID  . metoprolol tartrate  2.5 mg Intravenous Q6H   . sodium chloride 40 mL/hr at 12/22/16 0747  . sodium chloride    . lactated ringers    . potassium chloride 10 mEq (12/22/16 1100)   hydrALAZINE, ipratropium-albuterol, morphine injection, ondansetron (ZOFRAN) IV  Exam: Gen alert, NG in place, looks tired and ill No rash, cyanosis or gangrene Sclera anicteric, throat clear   No jvd or bruits Chest clear bilat RRR no MRG Abd soft ntnd no mass or ascites +bs GU foley cath in place MS no joint effusions or deformity Ext 1+ pretib  edema / no wounds or ulcers Neuro is alert, Ox 3 , nf  UA > 0-5 rbc/ wbc per hpf, prot 100, pH 5.0, small Hgb CT abd 5/16 with contrast >> acute SBO, poss inflamm stricture distal ileum; porcelain GB w stones, renal cysts, lumbar DJD, atherosclerosis CXR 5/15 - low lung vol's w atx bilat, post CABG Renal US > no hydro, kidneys not well visualized   Assessment: 1  Acute on CKD3 - due to IV contrast+ ARB. Creat down today, improving. ARB is being held.   2  Hypernatremia - have added IV D5W to correct H20 deficiency  3  SBO, sp exlap w/ SB resection due to infarction 4  Hx CAD/ CABG 5  DM 2 on insulin here 6  HTN bp's good now 7  Hypokalemia replacing   Plan - as above   Sonya Chavez Sonya Gautney  MD [facility] pager [phone]   12/22/2016, 11:46 AM    Recent Labs Lab 12/20/16 0534 12/21/16 0450 12/22/16 0424  NA 146* 145 151*  K 3.0* 3.6 2.8*  CL 107 111 111  CO2 24 19* 23  GLUCOSE 145* 132* 124*  BUN 43* 48* 46*  CREATININE 1.69* 1.75* 1.54*  CALCIUM 8.1* 7.2* 7.1*    Recent Labs Lab 12/17/16 2310  AST 16  ALT 9*  ALKPHOS 52  BILITOT 0.9  PROT 6.2*  ALBUMIN 3.6    Recent Labs Lab 12/17/16 2310  12/20/16 0534 12/21/16 0450 12/22/16 0424  WBC 19.6*  < > 12.7* 9.7 9.6  NEUTROABS 16.2*  --   --   --   --   HGB 9.4*  < > 8.7* 10.3* 8.9*  HCT 29.2*  < > 27.8* 32.3* 28.0*  MCV 74.5*  < > 74.9* 76.5* 76.9*  PLT 167  < > 160 160 153  < > = values in this interval not displayed. Iron/TIBC/Ferritin/ %Sat No results found for: IRON, TIBC, FERRITIN, IRONPCTSAT

## 2016-12-22 NOTE — Evaluation (Signed)
Occupational Therapy Evaluation Patient Details Name: Sonya Chavez M Joyce MRN: [id number] DOB: [birth year] Today's Date: 12/22/2016    History of Present Illness Pt is an 83 y/o female admitted secondary to AMS, now s/p ex lap for an SBO and appendectomy on 5/18. PMH including but not limited to DM, HTN and MI in 2003.   Clinical Impression   Pt reports she was independent with ADL PTA. Currently pt overall min guard for ADL and min assist for functional mobility due to mild balance deficits. Pt planning to d/c home with 24/7 supervision from family. Pt would benefit from continued skilled OT to address established goals.    Follow Up Recommendations  No OT follow up;Supervision/Assistance - 24 hour    Equipment Recommendations  3 in 1 bedside commode (to use as shower chair)    Recommendations for Other Services       Precautions / Restrictions Precautions Precautions: Fall Restrictions Weight Bearing Restrictions: No      Mobility Bed Mobility Overal bed mobility: Needs Assistance Bed Mobility: Supine to Sit;Sit to Supine     Supine to sit: Min assist Sit to supine: Min guard   General bed mobility comments: Min hand held assist to pull into upright position. Min guard for back to bed.  Transfers Overall transfer level: Needs assistance Equipment used: 1 person hand held assist Transfers: Sit to/from Stand Sit to Stand: Min guard         General transfer comment: Min guard for safety and balance    Balance Overall balance assessment: Needs assistance Sitting-balance support: Feet supported;No upper extremity supported Sitting balance-Leahy Scale: Good     Standing balance support: No upper extremity supported;During functional activity Standing balance-Leahy Scale: Fair                             ADL either performed or assessed with clinical judgement   ADL Overall ADL's : Needs assistance/impaired Eating/Feeding: Set  up;Sitting Eating/Feeding Details (indicate cue type and reason): to self feed ice chips Grooming: Min guard;Standing;Brushing hair   Upper Body Bathing: Set up;Supervision/ safety;Sitting   Lower Body Bathing: Min guard;Sit to/from stand   Upper Body Dressing : Set up;Supervision/safety;Sitting   Lower Body Dressing: Min guard;Sit to/from stand   Toilet Transfer: Minimal assistance;Ambulation Toilet Transfer Details (indicate cue type and reason): Simulated by sit to stand with functional mobility in room. Min assist for balance and lines         Functional mobility during ADLs: Minimal assistance (HHA)       Vision         Perception     Praxis      Pertinent Vitals/Pain Pain Assessment: No/denies pain     Hand Dominance Right   Extremity/Trunk Assessment Upper Extremity Assessment Upper Extremity Assessment: Overall WFL for tasks assessed   Lower Extremity Assessment Lower Extremity Assessment: Defer to PT evaluation   Cervical / Trunk Assessment Cervical / Trunk Assessment: Kyphotic   Communication Communication Communication: No difficulties   Cognition Arousal/Alertness: Awake/alert Behavior During Therapy: WFL for tasks assessed/performed Overall Cognitive Status: Within Functional Limits for tasks assessed                                     General Comments       Exercises     Shoulder Instructions      Home  Living Family/patient expects to be discharged to:: Private residence Living Arrangements: Spouse/significant other Available Help at Discharge: Family;Available 24 hours/day Type of Home: House Home Access: Stairs to enter Entergy CorporationEntrance Stairs-Number of Steps: 3 Entrance Stairs-Rails: Right;Left Home Layout: One level     Bathroom Shower/Tub: Producer, television/film/videoWalk-in shower   Bathroom Toilet: Handicapped height     Home Equipment: Environmental consultantWalker - 2 wheels          Prior Functioning/Environment Level of Independence: Independent         Comments: pt's spouse drives her wherever she needs to go        OT Problem List: Impaired balance (sitting and/or standing);Decreased knowledge of use of DME or AE      OT Treatment/Interventions: Self-care/ADL training;Energy conservation;DME and/or AE instruction;Therapeutic activities;Patient/family education;Balance training    OT Goals(Current goals can be found in the care plan section) Acute Rehab OT Goals Patient Stated Goal: return home OT Goal Formulation: With patient Time For Goal Achievement: 01/05/17 Potential to Achieve Goals: Good ADL Goals Pt Will Perform Grooming: with modified independence;standing (x3 tasks) Pt Will Perform Lower Body Bathing: sit to/from stand;with supervision Pt Will Perform Lower Body Dressing: sit to/from stand;with supervision Pt Will Transfer to Toilet: with supervision;ambulating;regular height toilet Pt Will Perform Toileting - Clothing Manipulation and hygiene: with supervision;sit to/from stand Pt Will Perform Tub/Shower Transfer: Shower transfer;with supervision;ambulating;shower seat  OT Frequency: Min 2X/week   Barriers to D/C:            Co-evaluation              AM-PAC PT "6 Clicks" Daily Activity     Outcome Measure Help from another person eating meals?: A Little Help from another person taking care of personal grooming?: A Little Help from another person toileting, which includes using toliet, bedpan, or urinal?: A Little Help from another person bathing (including washing, rinsing, drying)?: A Little Help from another person to put on and taking off regular upper body clothing?: A Little Help from another person to put on and taking off regular lower body clothing?: A Little 6 Click Score: 18   End of Session Equipment Utilized During Treatment: Oxygen  Activity Tolerance: Patient tolerated treatment well Patient left: in bed;with call bell/phone within reach  OT Visit Diagnosis: Unsteadiness on feet  (R26.81)                Time: [id number] OT Time Calculation (min): 20 min Charges:  OT General Charges $OT Visit: 1 Procedure OT Evaluation $OT Eval Moderate Complexity: 1 Procedure G-Codes:     Brande Uncapher A. Brett Albinooffey, M.S., OTR/L Pager: [phone]  Gaye AlkenBailey A Tomisha Reppucci 12/22/2016, 3:23 PM

## 2016-12-22 NOTE — Progress Notes (Signed)
K+ is 2.8 this am, Dr. Sharl MaLama texted.

## 2016-12-23 LAB — BASIC METABOLIC PANEL
ANION GAP: 8 (ref 5–15)
Anion gap: 8 (ref 5–15)
BUN: 28 mg/dL — ABNORMAL HIGH (ref 6–20)
BUN: 21 mg/dL — AB (ref 6–20)
CALCIUM: 7.3 mg/dL — AB (ref 8.9–10.3)
CALCIUM: 7.4 mg/dL — AB (ref 8.9–10.3)
CHLORIDE: 106 mmol/L (ref 101–111)
CHLORIDE: 102 mmol/L (ref 101–111)
CO2: 34 mmol/L — AB (ref 22–32)
CO2: 34 mmol/L — AB (ref 22–32)
CREATININE: 1.04 mg/dL — AB (ref 0.44–1.00)
CREATININE: 0.87 mg/dL (ref 0.44–1.00)
GFR calc non Af Amer: 46 mL/min — ABNORMAL LOW (ref >60)
GFR calc non Af Amer: 57 mL/min — ABNORMAL LOW (ref >60)
GFR, EST AFRICAN AMERICAN: 54 mL/min — AB (ref >60)
GLUCOSE: 201 mg/dL — AB (ref 65–99)
Glucose, Bld: 175 mg/dL — ABNORMAL HIGH (ref 65–99)
Potassium: 2.5 mmol/L — CL (ref 3.5–5.1)
Potassium: 3.2 mmol/L — ABNORMAL LOW (ref 3.5–5.1)
SODIUM: 144 mmol/L (ref 135–145)
Sodium: 148 mmol/L — ABNORMAL HIGH (ref 135–145)

## 2016-12-23 LAB — GLUCOSE, CAPILLARY
GLUCOSE-CAPILLARY: 189 mg/dL — AB (ref 65–99)
GLUCOSE-CAPILLARY: 169 mg/dL — AB (ref 65–99)
GLUCOSE-CAPILLARY: 182 mg/dL — AB (ref 65–99)
Glucose-Capillary: 148 mg/dL — ABNORMAL HIGH (ref 65–99)
Glucose-Capillary: 151 mg/dL — ABNORMAL HIGH (ref 65–99)
Glucose-Capillary: 226 mg/dL — ABNORMAL HIGH (ref 65–99)

## 2016-12-23 LAB — CBC
HEMATOCRIT: 29.2 % — AB (ref 36.0–46.0)
HEMOGLOBIN: 9.2 g/dL — AB (ref 12.0–15.0)
MCH: 24.1 pg — ABNORMAL LOW (ref 26.0–34.0)
MCHC: 31.5 g/dL (ref 30.0–36.0)
MCV: 76.4 fL — AB (ref 78.0–100.0)
Platelets: 181 10*3/uL (ref 150–400)
RBC: 3.82 MIL/uL — ABNORMAL LOW (ref 3.87–5.11)
RDW: 16.8 % — AB (ref 11.5–15.5)
WBC: 9.5 10*3/uL (ref 4.0–10.5)

## 2016-12-23 MED ORDER — PANTOPRAZOLE SODIUM 40 MG IV SOLR
40.0000 mg | INTRAVENOUS | Status: DC
  Administered 2016-12-23 – 2016-12-25 (×3): 40 mg via INTRAVENOUS
  Filled 2016-12-23 (×3): qty 40

## 2016-12-23 MED ORDER — POTASSIUM CHLORIDE 10 MEQ/100ML IV SOLN
10.0000 meq | INTRAVENOUS | Status: AC
  Administered 2016-12-23 (×6): 10 meq via INTRAVENOUS
  Filled 2016-12-23 (×6): qty 100

## 2016-12-23 MED ORDER — DEXTROSE-NACL 5-0.45 % IV SOLN
INTRAVENOUS | Status: DC
  Administered 2016-12-23: 15:00:00 via INTRAVENOUS
  Filled 2016-12-23: qty 1000

## 2016-12-23 MED ORDER — DEXTROSE-NACL 5-0.45 % IV SOLN
INTRAVENOUS | Status: DC
  Administered 2016-12-23: 16:00:00 via INTRAVENOUS

## 2016-12-23 NOTE — Progress Notes (Signed)
[facility] ROUNDING NOTE   Subjective:   Interval History:  The patient is a 83 y.o. year-old with history of HTN, DM2, MI/ CAD, HL and DJD presented on 5/16 with abd pain and AMS on 5/16.  CT abd showed SBO and possible inflammatory stricture in the ileum. admitted , NPO, IVF. She did not improve w conservative measures and was taken to the OR on 5/18 for exlap with small bowel resection and appy.  Findings were a closed loop small bowel obstruction/ infarction created by the appendix being fixated down in the pelvis.  8 inches of infarcted bowel were removed.  Over the last 48 hrs UOP has dropped off and creat has increased from 1.28 on admission, to 1.62, then 1.69 yest and 1. Today   Objective:  Vital signs in last 24 hours:  Temp:  [98.1 F (36.7 C)-99 F (37.2 C)] 99 F (37.2 C) (05/21 0700) Pulse Rate:  [87-88] 88 (05/21 0700) Resp:  [18-19] 19 (05/21 0700) BP: (146-150)/(53-65) 146/53 (05/21 0700) SpO2:  [100 %] 100 % (05/21 0700)  Weight change:  Filed Weights   12/19/16 0808  Weight: 121 lb 3.2 oz (55 kg)    Intake/Output: I/O last 3 completed shifts: In: 2630.3 [P.O.:402; I.V.:2108.3; NG/GT:120] Out: 5150 [Urine:2050; Emesis/NG output:3100]   Intake/Output this shift:  No intake/output data recorded.  CVS- RRR RS- CTA ABD- BS present soft non-distended EXT- no edema   Basic Metabolic Panel:  Recent Labs Lab 12/19/16 0356 12/20/16 0534 12/20/16 1100 12/21/16 0450 12/22/16 0424 12/23/16 0426  NA 143 146*  --  145 151* 148*  K 2.9* 3.0*  --  3.6 2.8* 2.5*  CL 104 107  --  111 111 106  CO2 25 24  --  19* 23 34*  GLUCOSE 102* 145*  --  132* 124* 201*  BUN 33* 43*  --  48* 46* 28*  CREATININE 1.62* 1.69*  --  1.75* 1.54* 1.04*  CALCIUM 8.5* 8.1*  --  7.2* 7.1* 7.3*  MG  --   --  1.7  --   --   --     Liver Function Tests:  Recent Labs Lab 12/17/16 2310  AST 16  ALT 9*  ALKPHOS 52  BILITOT 0.9  PROT 6.2*  ALBUMIN 3.6     Recent Labs Lab 12/17/16 2301  LIPASE 12   No results for input(s): AMMONIA in the last 168 hours.  CBC:  Recent Labs Lab 12/17/16 2310  12/19/16 0356 12/20/16 0534 12/21/16 0450 12/22/16 0424 12/23/16 0426  WBC 19.6*  < > 17.2* 12.7* 9.7 9.6 9.5  NEUTROABS 16.2*  --   --   --   --   --   --   HGB 9.4*  < > 9.4* 8.7* 10.3* 8.9* 9.2*  HCT 29.2*  < > 29.9* 27.8* 32.3* 28.0* 29.2*  MCV 74.5*  < > 74.6* 74.9* 76.5* 76.9* 76.4*  PLT 167  < > 159 160 160 153 181  < > = values in this interval not displayed.  Cardiac Enzymes: No results for input(s): CKTOTAL, CKMB, CKMBINDEX, TROPONINI in the last 168 hours.  BNP: Invalid input(s): POCBNP  CBG:  Recent Labs Lab 12/22/16 1635 12/22/16 1959 12/23/16 0027 12/23/16 0405 12/23/16 0826  GLUCAP 156* 172* 226* 189* 169*    Microbiology: Results for orders placed or performed during the hospital encounter of 12/17/16  Surgical pcr screen     Status: None   Collection Time: 12/20/16  7:16  AM  Result Value Ref Range Status   MRSA, PCR NEGATIVE NEGATIVE Final   Staphylococcus aureus NEGATIVE NEGATIVE Final    Comment:        The Xpert SA Assay (FDA approved for NASAL specimens in patients over 46 years of age), is one component of a comprehensive surveillance program.  Test performance has been validated by [facility] for patients greater than or equal to 1 year old. It is not intended to diagnose infection nor to guide or monitor treatment.     Coagulation Studies: No results for input(s): LABPROT, INR in the last 72 hours.  Urinalysis: No results for input(s): COLORURINE, LABSPEC, PHURINE, GLUCOSEU, HGBUR, BILIRUBINUR, KETONESUR, PROTEINUR, UROBILINOGEN, NITRITE, LEUKOCYTESUR in the last 72 hours.  Invalid input(s): APPERANCEUR    Imaging: US Renal  Result Date: 12/21/2016 CLINICAL DATA:  Acute kidney injury EXAM: RENAL / URINARY TRACT ULTRASOUND COMPLETE COMPARISON:  CT abdomen/ pelvis dated  12/18/2016 FINDINGS: Right Kidney: Length: Approximately 8.1 cm. Poorly visualized. No gross hydronephrosis. Left Kidney: Length: 8.9 cm.  No mass or hydronephrosis. Bladder: Not discretely visualized.  Indwelling catheter. Additional comments: Cholelithiasis with mural wall calcifications, better evaluated on CT. Mild pericholecystic fluid. IMPRESSION: Kidneys are poorly evaluated.  No hydronephrosis. Bladder not discretely visualized.  Indwelling catheter. Cholelithiasis with mural calcifications and mild pericholecystic fluid, better evaluated on recent CT. Electronically Signed   By: Charline Bills M.D.   On: 12/21/2016 21:48   Dg Chest Port 1 View  Result Date: 12/21/2016 CLINICAL DATA:  Acute renal injury EXAM: PORTABLE CHEST 1 VIEW COMPARISON:  12/17/2016 FINDINGS: Cardiac shadow is mildly enlarged. Nasogastric catheter is noted within the stomach. Postsurgical changes are seen. The lungs are hypoinflated with some mild left basilar atelectasis slightly increased from the prior exam. No other focal abnormality is noted. IMPRESSION: Increasing left basilar atelectasis with poor inspiratory effort. Electronically Signed   By: Alcide Clever M.D.   On: 12/21/2016 19:13     Medications:   . dextrose 100 mL/hr at 12/23/16 0527  . potassium chloride 10 mEq (12/23/16 1119)   . amLODipine  10 mg Oral QHS  . gabapentin  200 mg Oral QHS  . insulin aspart  0-9 Units Subcutaneous Q4H  . mouth rinse  15 mL Mouth Rinse BID  . metoprolol tartrate  2.5 mg Intravenous Q6H  . pantoprazole (PROTONIX) IV  40 mg Intravenous Q24H   hydrALAZINE, ipratropium-albuterol, morphine injection, ondansetron (ZOFRAN) IV  Assessment/ Plan:  1 Acute on CKD3 - due to IV contrast+ ARB. Creat down today, improving.ARB is being held.   will sign off  2  Hypernatremia - have added IV D5W to correct H20 deficiency   Continue free water 3 SBO, sp exlap w/ SB resection due to infarction 4 Hx CAD/ CABG 5 DM 2 on  insulin here 6 HTN bp's good now 7  Hypokalemia replacing    LOS: 5 Tamryn Popko W @TODAY @11 :43 AM

## 2016-12-23 NOTE — Progress Notes (Signed)
Occupational Therapy Treatment Patient Details Name: [place] Lad[place] MRN: [id number] DOB: 1925/10/30 Today's Date: 12/23/2016    History of present illness Pt is an 83 y/o female admitted secondary to AMS, now s/p ex lap for an SBO and appendectomy on 5/18. PMH including but not limited to DM, HTN and MI in 2003.   OT comments  Pt progressing towards established goals. Pt reports feeling fatigued and benefited from Min VCs to follow commands. Pt demonstrated good sitting balance to perform oral care with set up at EOB and LB dressing. Will continue to follow pt while she is admitted. Continue to recommend dc home once medically stable.    Follow Up Recommendations  No OT follow up;Supervision/Assistance - 24 hour    Equipment Recommendations  3 in 1 bedside commode    Recommendations for Other Services      Precautions / Restrictions Precautions Precautions: Fall Restrictions Weight Bearing Restrictions: No       Mobility Bed Mobility Overal bed mobility: Needs Assistance Bed Mobility: Supine to Sit;Sit to Supine     Supine to sit: Min guard Sit to supine: Min guard   General bed mobility comments: VCs for hand placement and pt used bed rail to transition from supine to EOB. Pt demosntrates good strength to transition back to supine and required Min A to scoot highier in bed  Transfers Overall transfer level: Needs assistance Equipment used: Rolling walker (2 wheeled) Transfers: Sit to/from Stand Sit to Stand: Supervision         General transfer comment: min guard for safety, good hand placement and power up    Balance Overall balance assessment: Needs assistance Sitting-balance support: Feet supported Sitting balance-Leahy Scale: Good     Standing balance support: During functional activity;No upper extremity supported Standing balance-Leahy Scale: Good Standing balance comment: able to stand without UE support                             ADL either performed or assessed with clinical judgement   ADL Overall ADL's : Needs assistance/impaired     Grooming: Set up;Sitting;Oral care Grooming Details (indicate cue type and reason): Pt performed oral care while carefully avoiding NG tube.              Lower Body Dressing: Min guard;Sit to/from stand Lower Body Dressing Details (indicate cue type and reason): Good ROM for socks and LB ADLs   Toilet Transfer Details (indicate cue type and reason): Declined toileting at this time                 Vision       Perception     Praxis      Cognition Arousal/Alertness: Awake/alert (Pt states she was feeling fatigued) Behavior During Therapy: WFL for tasks assessed/performed Overall Cognitive Status: Within Functional Limits for tasks assessed                                 General Comments: Pt states she was feeling fatigued and was having a little difficulty maintaining conversation due to drowsy behavior; pt had walked with PT before session. Additionally, pt requires increase VCs to follow instructions due to fatigue.         Exercises     Shoulder Instructions       General Comments Son and husband in room    Pertinent Vitals/  Pain       Pain Assessment: Faces Pain Score: 4  Faces Pain Scale: Hurts a little bit Pain Location: abdomen Pain Descriptors / Indicators: Constant;Aching Pain Intervention(s): Monitored during session  Home Living                                          Prior Functioning/Environment              Frequency  Min 2X/week        Progress Toward Goals  OT Goals(current goals can now be found in the care plan section)  Progress towards OT goals: Progressing toward goals  Acute Rehab OT Goals Patient Stated Goal: return home and to PLOF OT Goal Formulation: With patient Time For Goal Achievement: 01/05/17 Potential to Achieve Goals: Good ADL Goals Pt Will Perform  Grooming: with modified independence;standing Pt Will Perform Lower Body Bathing: sit to/from stand;with supervision Pt Will Perform Lower Body Dressing: sit to/from stand;with supervision Pt Will Transfer to Toilet: with supervision;ambulating;regular height toilet Pt Will Perform Toileting - Clothing Manipulation and hygiene: with supervision;sit to/from stand Pt Will Perform Tub/Shower Transfer: Shower transfer;with supervision;ambulating;shower seat  Plan Discharge plan remains appropriate    Co-evaluation                 AM-PAC PT "6 Clicks" Daily Activity     Outcome Measure   Help from another person eating meals?: A Little Help from another person taking care of personal grooming?: A Little Help from another person toileting, which includes using toliet, bedpan, or urinal?: A Little Help from another person bathing (including washing, rinsing, drying)?: A Little Help from another person to put on and taking off regular upper body clothing?: A Little Help from another person to put on and taking off regular lower body clothing?: A Little 6 Click Score: 18    End of Session    OT Visit Diagnosis: Unsteadiness on feet (R26.81)   Activity Tolerance Patient tolerated treatment well   Patient Left in bed;with call bell/phone within reach   Nurse Communication Mobility status        Time: 1610-9604 OT Time Calculation (min): 19 min  Charges: OT General Charges $OT Visit: 1 Procedure OT Treatments $Self Care/Home Management : 8-22 mins  Yerlin Gasparyan, OTR/L [phone]   Theodoro Grist Jaki Hammerschmidt 12/23/2016, 1:52 PM

## 2016-12-23 NOTE — Progress Notes (Signed)
[facility] Progress Note  3 Days Post-Op  Subjective: CC: No complaints Patient denies N/V. Some tenderness around incision but pain well controlled. Sitting up in the chair more. No flatus, but patient had first BM since surgery this AM. BM was soft, and patient did not notice any blood or melena. Wants to go home.   Objective: Vital signs in last 24 hours: Temp:  [98.1 F (36.7 C)-99 F (37.2 C)] 99 F (37.2 C) (05/21 0700) Pulse Rate:  [87-88] 88 (05/21 0700) Resp:  [18-19] 19 (05/21 0700) BP: (146-150)/(53-65) 146/53 (05/21 0700) SpO2:  [100 %] 100 % (05/21 0700) Last BM Date: 12/16/16  Intake/Output from previous day: 05/20 0701 - 05/21 0700 In: 2207 [P.O.:402; I.V.:1745; NG/GT:60] Out: 4350 [Urine:1550; Emesis/NG output:2800] Intake/Output this shift: No intake/output data recorded.  PE: Gen:  Alert, NAD, pleasant Card:  Regular rate and rhythm, pedal pulses 2+ bilaterally Pulm:  Normal effort, clear to auscultation bilaterally Abd: Soft, non-tender, non-distended, bowel sounds hypoactive in all 4 quadrants, incision C/D/I. ~2800 cc red-brown NG output in last 24 hr. Skin: warm and dry, no rashes  Psych: A&Ox3   Lab Results:   Recent Labs  12/22/16 0424 12/23/16 0426  WBC 9.6 9.5  HGB 8.9* 9.2*  HCT 28.0* 29.2*  PLT 153 181   BMET  Recent Labs  12/22/16 0424 12/23/16 0426  NA 151* 148*  K 2.8* 2.5*  CL 111 106  CO2 23 34*  GLUCOSE 124* 201*  BUN 46* 28*  CREATININE 1.54* 1.04*  CALCIUM 7.1* 7.3*   PT/INR No results for input(s): LABPROT, INR in the last 72 hours. CMP     Component Value Date/Time   NA 148 (H) 12/23/2016 0426   K 2.5 (LL) 12/23/2016 0426   CL 106 12/23/2016 0426   CO2 34 (H) 12/23/2016 0426   GLUCOSE 201 (H) 12/23/2016 0426   BUN 28 (H) 12/23/2016 0426   CREATININE 1.04 (H) 12/23/2016 0426   CALCIUM 7.3 (L) 12/23/2016 0426   PROT 6.2 (L) 12/17/2016 2310   ALBUMIN 3.6 12/17/2016 2310   AST 16 12/17/2016  2310   ALT 9 (L) 12/17/2016 2310   ALKPHOS 52 12/17/2016 2310   BILITOT 0.9 12/17/2016 2310   GFRNONAA 46 (L) 12/23/2016 0426   GFRAA 54 (L) 12/23/2016 0426   Lipase     Component Value Date/Time   LIPASE 12 12/17/2016 2301     Studies/Results: [facility]  Result Date: 12/21/2016 CLINICAL DATA:  Acute kidney injury EXAM: RENAL / URINARY TRACT ULTRASOUND COMPLETE COMPARISON:  CT abdomen/ pelvis dated 12/18/2016 FINDINGS: Right Kidney: Length: Approximately 8.1 cm. Poorly visualized. No gross hydronephrosis. Left Kidney: Length: 8.9 cm.  No mass or hydronephrosis. Bladder: Not discretely visualized.  Indwelling catheter. Additional comments: Cholelithiasis with mural wall calcifications, better evaluated on CT. Mild pericholecystic fluid. IMPRESSION: Kidneys are poorly evaluated.  No hydronephrosis. Bladder not discretely visualized.  Indwelling catheter. Cholelithiasis with mural calcifications and mild pericholecystic fluid, better evaluated on recent CT. Electronically Signed   By: Charline BillsSriyesh  Krishnan M.D.   On: 12/21/2016 21:48   Dg Chest Port 1 View  Result Date: 12/21/2016 CLINICAL DATA:  Acute renal injury EXAM: PORTABLE CHEST 1 VIEW COMPARISON:  12/17/2016 FINDINGS: Cardiac shadow is mildly enlarged. Nasogastric catheter is noted within the stomach. Postsurgical changes are seen. The lungs are hypoinflated with some mild left basilar atelectasis slightly increased from the prior exam. No other focal abnormality is noted. IMPRESSION: Increasing left basilar atelectasis with poor  inspiratory effort. Electronically Signed   By: Alcide Clever M.D.   On: 12/21/2016 19:13    Anti-infectives: Anti-infectives    Start     Dose/Rate Route Frequency Ordered Stop   12/20/16 1127  dextrose 5 % with cefOXitin (MEFOXIN) ADS Med  Status:  Discontinued    Comments:  Lorenda Ishihara   : cabinet override      12/20/16 1127 12/20/16 1134   12/20/16 0730  cefOXitin (MEFOXIN) 1 g in dextrose 5 % 50 mL IVPB      1 g 100 mL/hr over 30 Minutes Intravenous To Short Stay 12/20/16 0715 12/20/16 1309       Assessment/Plan S/P Ex-lap, SB resection, appendectomy - 5/18 Dr Magnus Ivan - POD#3  - PT/OT, continue to get patient out of bed, ambulate - 2800 cc out of NG in last 24 hrs reddish brown output.  - concern for GI bleeding - H/H stable. check stool for occult bleeding, started IV protonix. Hold ASA and heparin.  - Continue NG, NPO, bowel rest AKI - Creatinine improving, UOP increased. Appreciate nephrology involvement DM HTN CAD Acute on chronic Anemia - Hgb 9.2, Hct 29.2.  Hypernatremia - Na 148 today Hypokalemia - K 2.5, KCl runs today  FEN - NGT, NPO, IVF, bowel rest, IV protonix VTE - Hold SQ heparin and ASA for now ID- none.  LOS: 5 days    Wells Guiles , [facility] 12/23/2016, 7:27 AM Pager: [phone] Consults: [phone] Mon-Fri 7:00 am-4:30 pm Sat-Sun 7:00 am-11:30 am

## 2016-12-23 NOTE — Progress Notes (Signed)
Physical Therapy Treatment Patient Details Name: Sonya Chavez MRN: [id number] DOB: [birth year] Today's Date: 12/23/2016    History of Present Illness Pt is an 83 y/o female admitted secondary to AMS, now s/p ex lap for an SBO and appendectomy on 5/18. PMH including but not limited to DM, HTN and MI in 2003.    PT Comments    Pt is making good progress towards her goals. Pt currently, minA for bed mobility, supervision for transfers with RW and min guard for ambulation of 270 feet with RW. Pt requires skilled PT in the acute setting to continue to progress her ambulation and to improve LE strength and endurance to be safe in her home environment at discharge.    Follow Up Recommendations  No PT follow up;Supervision/Assistance - 24 hour     Equipment Recommendations  None recommended by PT    Recommendations for Other Services       Precautions / Restrictions Precautions Precautions: Fall Restrictions Weight Bearing Restrictions: No    Mobility  Bed Mobility Overal bed mobility: Needs Assistance Bed Mobility: Supine to Sit;Sit to Supine     Supine to sit: Min assist Sit to supine: Min assist   General bed mobility comments: minA for pad scoot of hips to EoB in supine to sit and management of LE into bed in sit to supine  Transfers Overall transfer level: Needs assistance Equipment used: Rolling walker (2 wheeled) Transfers: Sit to/from Stand Sit to Stand: Supervision         General transfer comment: min guard for safety, good hand placement and power up  Ambulation/Gait Ambulation/Gait assistance: Min guard Ambulation Distance (Feet): 270 Feet Assistive device: Rolling walker (2 wheeled) Gait Pattern/deviations: Step-through pattern;Decreased stride length;Trunk flexed Gait velocity: decreased Gait velocity interpretation: Below normal speed for age/gender General Gait Details: slow, steady gait, close min guard for safety        Balance  Overall balance assessment: Needs assistance Sitting-balance support: Feet supported Sitting balance-Leahy Scale: Good     Standing balance support: During functional activity;No upper extremity supported Standing balance-Leahy Scale: Good Standing balance comment: able to stand without UE support                             Cognition Arousal/Alertness: Awake/alert Behavior During Therapy: WFL for tasks assessed/performed Overall Cognitive Status: Within Functional Limits for tasks assessed                                           General Comments General comments (skin integrity, edema, etc.): Son and husband in room      Pertinent Vitals/Pain Pain Assessment: 0-10 Pain Score: 4  Pain Location: abdomen Pain Descriptors / Indicators: Constant;Aching Pain Intervention(s): Monitored during session  VSS           PT Goals (current goals can now be found in the care plan section) Acute Rehab PT Goals Patient Stated Goal: return home and to PLOF PT Goal Formulation: With patient Time For Goal Achievement: 01/04/17 Potential to Achieve Goals: Good Progress towards PT goals: Progressing toward goals    Frequency    Min 3X/week      PT Plan Current plan remains appropriate       AM-PAC PT "6 Clicks" Daily Activity  Outcome Measure  Difficulty turning over in bed (including adjusting  bedclothes, sheets and blankets)?: A Little Difficulty moving from lying on back to sitting on the side of the bed? : A Little Difficulty sitting down on and standing up from a chair with arms (e.g., wheelchair, bedside commode, etc,.)?: A Little Help needed moving to and from a bed to chair (including a wheelchair)?: A Little Help needed walking in hospital room?: A Little Help needed climbing 3-5 steps with a railing? : A Lot 6 Click Score: 17    End of Session Equipment Utilized During Treatment: Gait belt Activity Tolerance: Patient tolerated  treatment well Patient left: in chair;with call bell/phone within reach;with family/visitor present Nurse Communication: Mobility status PT Visit Diagnosis: Unsteadiness on feet (R26.81);Other abnormalities of gait and mobility (R26.89)     Time: 0981-1914 PT Time Calculation (min) (ACUTE ONLY): 31 min  Charges:  $Gait Training: 23-37 mins                    G Codes:       Laci Frenkel B. Beverely Risen PT, DPT Acute Rehabilitation  [phone] Pager [phone]     Elon Alas Fleet 12/23/2016, 12:25 PM

## 2016-12-23 NOTE — Progress Notes (Signed)
[facility]  PROGRESS NOTE  [place] M Peruski ZOX:[id number] DOB: 1925/10/05 DOA: 12/17/2016 PCP: Alysia Penna, MD   Brief HPI:   83 y.o.femalewith medical history significant of DM2, MI. Patient is brought in by family after having AMS this afternoon. Patient was apparently confused and was asking questions repeatedly. She denies any pain, fever, chills, cough. When asked about vomiting says "not too much". She denies abd pain, however family states that she was having abd pain around 4am yesterday when she was up trying to use bathroom. She states she had a small BM at that time. Per family members at bedside current mental status is actually close to baseline.  Patient was seen by general surgery and NG tube has been inserted.    Subjective   Patient seen and examined, denies pain, no flatus yet.   Assessment/Plan:     1. Small bowel obstruction- Status post expiratory laparotomy, small bowel resection, appendectomy. General surgery following. 2. Hypokalemia- Potassium is low at 2.5,  10 meq of IV KCl 6 ordered. Will check BMP today. 3. Acute kidney injury- today creatinine is improving at 1.04.nephrology has signed off. 4. Hypernatremia- sodium is 148, improving, started on D5W at 100 mL per hour, which has been discontinued. Follow BMP in a.m. 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 6. Diabetes mellitus- blood glucose is well controlled, continue sliding scale insulin with NovoLog. Glucose 189,169,182. 7. Hypertension- blood pressure is well controlled, chlorthalidone on hold, continue  Amlodipine and losartan is on hold started on  metoprolol 2.5 mg IV every 6 hours 8. CAD- stable, continue aspirin 9. Leukocytosis-resolved,  WBC 9.5 likely reactive chest x-ray negative for pneumonia, UA clear. Patient is afebrile.     DVT prophylaxis: Heparin  Code Status -full code  Family Communication: Discussed with patient's  husband at bedside  Disposition Plan- as per general surgery   Consultants:  General surgery  Procedures:  None  Continuous infusions . potassium chloride 10 mEq (12/23/16 1410)      Antibiotics:   Anti-infectives    Start     Dose/Rate Route Frequency Ordered Stop   12/20/16 1127  dextrose 5 % with cefOXitin (MEFOXIN) ADS Med  Status:  Discontinued    Comments:  Lorenda Ishihara   : cabinet override      12/20/16 1127 12/20/16 1134   12/20/16 0730  cefOXitin (MEFOXIN) 1 g in dextrose 5 % 50 mL IVPB     1 g 100 mL/hr over 30 Minutes Intravenous To Short Stay 12/20/16 0715 12/20/16 1309       Objective   Vitals:   12/22/16 0611 12/22/16 1640 12/22/16 2006 12/23/16 0700  BP: (!) 146/49 (!) 150/65 (!) 150/60 (!) 146/53  Pulse: 84 87 88 88  Resp: 17 18 19 19   Temp: 97.7 F (36.5 C) 98.2 F (36.8 C) 98.1 F (36.7 C) 99 F (37.2 C)  TempSrc: Oral Oral Oral Oral  SpO2: 98% 100% 100% 100%  Weight:      Height:        Intake/Output Summary (Last 24 hours) at 12/23/16 1420 Last data filed at 12/23/16 1247  Gross per 24 hour  Intake             2207 ml  Output             4850 ml  Net            -2643 ml   Filed Weights   12/19/16 8119  Weight: 55 kg (121 lb 3.2 oz)     Physical Examination:  Physical Exam: Eyes: No icterus, extraocular muscles intact  Mouth: Oral mucosa is moist, no lesions on palate,  Neck: Supple, no deformities, masses, or tenderness Lungs: Normal respiratory effort, bilateral clear to auscultation, no crackles or wheezes.  Heart: Regular rate and rhythm, S1 and S2 normal, no murmurs, rubs auscultated Abdomen: BS normoactive,soft,nondistended,non-tender to palpation,no organomegaly Extremities: No pretibial edema, no erythema, no cyanosis, no clubbing Neuro : Alert and oriented to time, place and person, No focal deficits Skin: No rashes seen on exam    Data Reviewed: I have personally reviewed following labs and imaging  studies  CBG:  Recent Labs Lab 12/22/16 1959 12/23/16 0027 12/23/16 0405 12/23/16 0826 12/23/16 1215  GLUCAP 172* 226* 189* 169* 182*    CBC:  Recent Labs Lab 12/17/16 2310  12/19/16 0356 12/20/16 0534 12/21/16 0450 12/22/16 0424 12/23/16 0426  WBC 19.6*  < > 17.2* 12.7* 9.7 9.6 9.5  NEUTROABS 16.2*  --   --   --   --   --   --   HGB 9.4*  < > 9.4* 8.7* 10.3* 8.9* 9.2*  HCT 29.2*  < > 29.9* 27.8* 32.3* 28.0* 29.2*  MCV 74.5*  < > 74.6* 74.9* 76.5* 76.9* 76.4*  PLT 167  < > 159 160 160 153 181  < > = values in this interval not displayed.  Basic Metabolic Panel:  Recent Labs Lab 12/19/16 0356 12/20/16 0534 12/20/16 1100 12/21/16 0450 12/22/16 0424 12/23/16 0426  NA 143 146*  --  145 151* 148*  K 2.9* 3.0*  --  3.6 2.8* 2.5*  CL 104 107  --  111 111 106  CO2 25 24  --  19* 23 34*  GLUCOSE 102* 145*  --  132* 124* 201*  BUN 33* 43*  --  48* 46* 28*  CREATININE 1.62* 1.69*  --  1.75* 1.54* 1.04*  CALCIUM 8.5* 8.1*  --  7.2* 7.1* 7.3*  MG  --   --  1.7  --   --   --     Recent Results (from the past 240 hour(s))  Surgical pcr screen     Status: None   Collection Time: 12/20/16  7:16 AM  Result Value Ref Range Status   MRSA, PCR NEGATIVE NEGATIVE Final   Staphylococcus aureus NEGATIVE NEGATIVE Final    Comment:        The Xpert SA Assay (FDA approved for NASAL specimens in patients over 81 years of age), is one component of a comprehensive surveillance program.  Test performance has been validated by [facility] for patients greater than or equal to 81 year old. It is not intended to diagnose infection nor to guide or monitor treatment.      Liver Function Tests:  Recent Labs Lab 12/17/16 2310  AST 16  ALT 9*  ALKPHOS 52  BILITOT 0.9  PROT 6.2*  ALBUMIN 3.6    Recent Labs Lab 12/17/16 2301  LIPASE 12   No results for input(s): AMMONIA in the last 168 hours.  Cardiac Enzymes: No results for input(s): CKTOTAL, CKMB, CKMBINDEX,  TROPONINI in the last 168 hours. BNP (last 3 results) No results for input(s): BNP in the last 8760 hours.  ProBNP (last 3 results) No results for input(s): PROBNP in the last 8760 hours.    Studies: [place] Renal  Result Date: 12/21/2016 CLINICAL DATA:  Acute kidney injury EXAM: RENAL / URINARY  TRACT ULTRASOUND COMPLETE COMPARISON:  CT abdomen/ pelvis dated 12/18/2016 FINDINGS: Right Kidney: Length: Approximately 8.1 cm. Poorly visualized. No gross hydronephrosis. Left Kidney: Length: 8.9 cm.  No mass or hydronephrosis. Bladder: Not discretely visualized.  Indwelling catheter. Additional comments: Cholelithiasis with mural wall calcifications, better evaluated on CT. Mild pericholecystic fluid. IMPRESSION: Kidneys are poorly evaluated.  No hydronephrosis. Bladder not discretely visualized.  Indwelling catheter. Cholelithiasis with mural calcifications and mild pericholecystic fluid, better evaluated on recent CT. Electronically Signed   By: Charline Bills M.D.   On: 12/21/2016 21:48   Dg Chest Port 1 View  Result Date: 12/21/2016 CLINICAL DATA:  Acute renal injury EXAM: PORTABLE CHEST 1 VIEW COMPARISON:  12/17/2016 FINDINGS: Cardiac shadow is mildly enlarged. Nasogastric catheter is noted within the stomach. Postsurgical changes are seen. The lungs are hypoinflated with some mild left basilar atelectasis slightly increased from the prior exam. No other focal abnormality is noted. IMPRESSION: Increasing left basilar atelectasis with poor inspiratory effort. Electronically Signed   By: Alcide Clever M.D.   On: 12/21/2016 19:13        Time spent: 25 min  [facility]   [facility] Pager [phone]. If 7PM-7AM, please contact night-coverage at [email or web address], Office  [phone]  password [email or web address] 12/23/2016, 2:20 PM  LOS: 5 days

## 2016-12-23 NOTE — Care Management Important Message (Signed)
Important Message  Patient Details  Name: [place] M Chopra MRN: [id number] Date of Birth: 11-Jul-1926   Medicare Important Message Given:  Yes    Tazia Illescas Stefan ChurchBratton 12/23/2016, 8:23 AM

## 2016-12-24 DIAGNOSIS — E876 Hypokalemia: Secondary | ICD-10-CM

## 2016-12-24 LAB — BASIC METABOLIC PANEL
Anion gap: 9 (ref 5–15)
BUN: 16 mg/dL (ref 6–20)
CALCIUM: 7.4 mg/dL — AB (ref 8.9–10.3)
CO2: 33 mmol/L — ABNORMAL HIGH (ref 22–32)
Chloride: 101 mmol/L (ref 101–111)
Creatinine, Ser: 0.89 mg/dL (ref 0.44–1.00)
GFR calc Af Amer: >60 mL/min (ref >60)
GFR, EST NON AFRICAN AMERICAN: 56 mL/min — AB (ref >60)
GLUCOSE: 124 mg/dL — AB (ref 65–99)
Potassium: 3 mmol/L — ABNORMAL LOW (ref 3.5–5.1)
Sodium: 143 mmol/L (ref 135–145)

## 2016-12-24 LAB — TYPE AND SCREEN
ABO/RH(D): O POS
Antibody Screen: NEGATIVE
Unit division: 0
Unit division: 0

## 2016-12-24 LAB — GLUCOSE, CAPILLARY
GLUCOSE-CAPILLARY: 174 mg/dL — AB (ref 65–99)
GLUCOSE-CAPILLARY: 177 mg/dL — AB (ref 65–99)
Glucose-Capillary: 108 mg/dL — ABNORMAL HIGH (ref 65–99)
Glucose-Capillary: 110 mg/dL — ABNORMAL HIGH (ref 65–99)
Glucose-Capillary: 150 mg/dL — ABNORMAL HIGH (ref 65–99)
Glucose-Capillary: 192 mg/dL — ABNORMAL HIGH (ref 65–99)

## 2016-12-24 LAB — BPAM RBC
BLOOD PRODUCT EXPIRATION DATE: 201806132359
Blood Product Expiration Date: 201806132359
ISSUE DATE / TIME: 201805181228
ISSUE DATE / TIME: 201805181228
UNIT TYPE AND RH: 5100
Unit Type and Rh: 5100

## 2016-12-24 MED ORDER — CARVEDILOL 12.5 MG PO TABS
12.5000 mg | ORAL_TABLET | Freq: Two times a day (BID) | ORAL | Status: DC
  Administered 2016-12-24 – 2016-12-25 (×2): 12.5 mg via ORAL
  Filled 2016-12-24 (×2): qty 1

## 2016-12-24 MED ORDER — POTASSIUM CHLORIDE 2 MEQ/ML IV SOLN
INTRAVENOUS | Status: DC
  Administered 2016-12-24 (×2): via INTRAVENOUS
  Filled 2016-12-24 (×4): qty 1000

## 2016-12-24 MED ORDER — ASPIRIN EC 81 MG PO TBEC
81.0000 mg | DELAYED_RELEASE_TABLET | Freq: Every day | ORAL | Status: DC
  Administered 2016-12-24 – 2016-12-25 (×2): 81 mg via ORAL
  Filled 2016-12-24 (×2): qty 1

## 2016-12-24 MED ORDER — POTASSIUM CHLORIDE 10 MEQ/100ML IV SOLN
10.0000 meq | INTRAVENOUS | Status: AC
  Administered 2016-12-24 (×2): 10 meq via INTRAVENOUS
  Filled 2016-12-24 (×2): qty 100

## 2016-12-24 MED ORDER — POTASSIUM CHLORIDE 10 MEQ/100ML IV SOLN
INTRAVENOUS | Status: AC
  Administered 2016-12-24: 10 meq
  Filled 2016-12-24: qty 100

## 2016-12-24 NOTE — Progress Notes (Signed)
[facility]  PROGRESS NOTE  [place] M Nichter ZOX:[id number] DOB: 02/27/1926 DOA: 12/17/2016 PCP: Alysia Penna, MD   Brief HPI:   83 y.o.femalewith medical history significant of DM2, MI. Patient is brought in by family after having AMS this afternoon. Patient was apparently confused and was asking questions repeatedly. She denies any pain, fever, chills, cough. When asked about vomiting says "not too much". She denies abd pain, however family states that she was having abd pain around 4am yesterday when she was up trying to use bathroom. She states she had a small BM at that time. Per family members at bedside current mental status is actually close to baseline.     Subjective   Patient seen and examined, having BMs this morning.   Assessment/Plan:     1. Small bowel obstruction- Resolved, Status post expiratory laparotomy, small bowel resection, appendectomy. General surgery following. Patient NG tube clamped and started on diet per surgery 2. Hypokalemia- Potassium is still low at 3.0 low at 2.5,  10 meq of IV KCl 3  ordered. Will check BMP  in am. Patient also on D5 half-normal saline +20 mg of KCl at 75 ML per hour. 3. Acute kidney injury- today creatinine is improving at 0.89. Nephrology has signed off. 4. Hypernatremia- sodium is 148, improving, started on D5W at 100 mL per hour, which has been discontinued. Follow BMP in a.m. 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 6. Diabetes mellitus- blood glucose is well controlled, continue sliding scale insulin with NovoLog. Glucose 189,169,182. 7. Hypertension- blood pressure is well controlled, chlorthalidone and losartan on hold, continue  Amlodipine. Patient was started on  metoprolol 2.5 mg IV every 6 hours. Will switch to Coreg 12.5 mg by mouth twice a day. 8. CAD- stable, continue aspirin 9. Leukocytosis-resolved,  WBC 9.5 likely reactive ,chest x-ray negative for pneumonia,  UA clear. Patient is afebrile.     DVT prophylaxis: Heparin  Code Status -full code  Family Communication: Discussed with patient's husband at bedside  Disposition Plan- as per general surgery   Consultants:  General surgery  Procedures:  None  Continuous infusions . dextrose 5 % and 0.45% NaCl 1,000 mL with potassium chloride 20 mEq infusion 75 mL/hr at 12/24/16 1253      Antibiotics:   Anti-infectives    Start     Dose/Rate Route Frequency Ordered Stop   12/20/16 1127  dextrose 5 % with cefOXitin (MEFOXIN) ADS Med  Status:  Discontinued    Comments:  Lorenda Ishihara   : cabinet override      12/20/16 1127 12/20/16 1134   12/20/16 0730  cefOXitin (MEFOXIN) 1 g in dextrose 5 % 50 mL IVPB     1 g 100 mL/hr over 30 Minutes Intravenous To Short Stay 12/20/16 0715 12/20/16 1309       Objective   Vitals:   12/23/16 1452 12/23/16 1900 12/24/16 0518 12/24/16 1350  BP: (!) 148/62 (!) 144/51 (!) 151/61 (!) 153/62  Pulse: 86 86 87 85  Resp: 18 19 19 18   Temp: 98.2 F (36.8 C) 98.2 F (36.8 C) 99 F (37.2 C) 98 F (36.7 C)  TempSrc: Oral Oral Oral Oral  SpO2: 93% 94% 95% 91%  Weight:      Height:        Intake/Output Summary (Last 24 hours) at 12/24/16 1455 Last data filed at 12/24/16 1400  Gross per 24 hour  Intake  2022.5 ml  Output             1100 ml  Net            922.5 ml   Filed Weights   12/19/16 0808  Weight: 55 kg (121 lb 3.2 oz)     Physical Examination:  Physical Exam: Eyes: No icterus, extraocular muscles intact  Mouth: Oral mucosa is moist, no lesions on palate,  Neck: Supple, no deformities, masses, or tenderness Lungs: Normal respiratory effort, bilateral clear to auscultation, no crackles or wheezes.  Heart: Regular rate and rhythm, S1 and S2 normal, no murmurs, rubs auscultated Abdomen: BS normoactive,soft,nondistended,non-tender to palpation,no organomegaly Extremities: No pretibial edema, no erythema, no cyanosis, no  clubbing Neuro : Alert and oriented to time, place and person, No focal deficits Skin: No rashes seen on exam    Data Reviewed: I have personally reviewed following labs and imaging studies  CBG:  Recent Labs Lab 12/23/16 2003 12/24/16 0012 12/24/16 0405 12/24/16 0743 12/24/16 1213  GLUCAP 148* 192* 110* 174* 150*    CBC:  Recent Labs Lab 12/17/16 2310  12/19/16 0356 12/20/16 0534 12/21/16 0450 12/22/16 0424 12/23/16 0426  WBC 19.6*  < > 17.2* 12.7* 9.7 9.6 9.5  NEUTROABS 16.2*  --   --   --   --   --   --   HGB 9.4*  < > 9.4* 8.7* 10.3* 8.9* 9.2*  HCT 29.2*  < > 29.9* 27.8* 32.3* 28.0* 29.2*  MCV 74.5*  < > 74.6* 74.9* 76.5* 76.9* 76.4*  PLT 167  < > 159 160 160 153 181  < > = values in this interval not displayed.  Basic Metabolic Panel:  Recent Labs Lab 12/20/16 1100 12/21/16 0450 12/22/16 0424 12/23/16 0426 12/23/16 1537 12/24/16 0533  NA  --  145 151* 148* 144 143  K  --  3.6 2.8* 2.5* 3.2* 3.0*  CL  --  111 111 106 102 101  CO2  --  19* 23 34* 34* 33*  GLUCOSE  --  132* 124* 201* 175* 124*  BUN  --  48* 46* 28* 21* 16  CREATININE  --  1.75* 1.54* 1.04* 0.87 0.89  CALCIUM  --  7.2* 7.1* 7.3* 7.4* 7.4*  MG 1.7  --   --   --   --   --     Recent Results (from the past 240 hour(s))  Surgical pcr screen     Status: None   Collection Time: 12/20/16  7:16 AM  Result Value Ref Range Status   MRSA, PCR NEGATIVE NEGATIVE Final   Staphylococcus aureus NEGATIVE NEGATIVE Final    Comment:        The Xpert SA Assay (FDA approved for NASAL specimens in patients over 81 years of age), is one component of a comprehensive surveillance program.  Test performance has been validated by [facility] for patients greater than or equal to 81 year old. It is not intended to diagnose infection nor to guide or monitor treatment.      Liver Function Tests:  Recent Labs Lab 12/17/16 2310  AST 16  ALT 9*  ALKPHOS 52  BILITOT 0.9  PROT 6.2*  ALBUMIN 3.6     Recent Labs Lab 12/17/16 2301  LIPASE 12   No results for input(s): AMMONIA in the last 168 hours.     Studies: No results found.   Time spent: 25 min  [facility]   [facility] Pager [phone].  If 7PM-7AM, please contact night-coverage at [email or web address], Office  [phone]  password [email or web address] 12/24/2016, 2:55 PM  LOS: 6 days

## 2016-12-24 NOTE — Progress Notes (Signed)
[facility] Progress Note  4 Days Post-Op  Subjective: CC: No complaints Patient feels pretty well. Denies n/v, abdominal pain. UOP good. Ambulating well. No more BMs, no flatus. Denies dizziness, chest pain, SOB. VSS.   Objective: Vital signs in last 24 hours: Temp:  [98.2 F (36.8 C)-99 F (37.2 C)] 99 F (37.2 C) (05/22 0518) Pulse Rate:  [86-87] 87 (05/22 0518) Resp:  [18-19] 19 (05/22 0518) BP: (144-151)/(51-62) 151/61 (05/22 0518) SpO2:  [93 %-95 %] 95 % (05/22 0518) Last BM Date: 12/23/16  Intake/Output from previous day: 05/21 0701 - 05/22 0700 In: 1122.5 [I.V.:1062.5; NG/GT:60] Out: 2075 [Urine:1075; Emesis/NG output:1000] Intake/Output this shift: No intake/output data recorded.  PE: Gen:  Alert, NAD, pleasant Card:  Regular rate and rhythm Pulm:  Normal effort, clear to auscultation bilaterally Abd: Soft, mildly TTP in RLQ, non-distended, bowel sounds hypoactive in all 4 quadrants, incisions C/D/I. NG output green-brown. Skin: warm and dry, no rashes  Psych: A&Ox3   Lab Results:   Recent Labs  12/22/16 0424 12/23/16 0426  WBC 9.6 9.5  HGB 8.9* 9.2*  HCT 28.0* 29.2*  PLT 153 181   BMET  Recent Labs  12/23/16 1537 12/24/16 0533  NA 144 143  K 3.2* 3.0*  CL 102 101  CO2 34* 33*  GLUCOSE 175* 124*  BUN 21* 16  CREATININE 0.87 0.89  CALCIUM 7.4* 7.4*   PT/INR No results for input(s): LABPROT, INR in the last 72 hours. CMP     Component Value Date/Time   NA 143 12/24/2016 0533   K 3.0 (L) 12/24/2016 0533   CL 101 12/24/2016 0533   CO2 33 (H) 12/24/2016 0533   GLUCOSE 124 (H) 12/24/2016 0533   BUN 16 12/24/2016 0533   CREATININE 0.89 12/24/2016 0533   CALCIUM 7.4 (L) 12/24/2016 0533   PROT 6.2 (L) 12/17/2016 2310   ALBUMIN 3.6 12/17/2016 2310   AST 16 12/17/2016 2310   ALT 9 (L) 12/17/2016 2310   ALKPHOS 52 12/17/2016 2310   BILITOT 0.9 12/17/2016 2310   GFRNONAA 56 (L) 12/24/2016 0533   GFRAA >60 12/24/2016 0533    Lipase     Component Value Date/Time   LIPASE 12 12/17/2016 2301    Anti-infectives: Anti-infectives    Start     Dose/Rate Route Frequency Ordered Stop   12/20/16 1127  dextrose 5 % with cefOXitin (MEFOXIN) ADS Med  Status:  Discontinued    Comments:  Lorenda Ishihara   : cabinet override      12/20/16 1127 12/20/16 1134   12/20/16 0730  cefOXitin (MEFOXIN) 1 g in dextrose 5 % 50 mL IVPB     1 g 100 mL/hr over 30 Minutes Intravenous To Short Stay 12/20/16 0715 12/20/16 1309       Assessment/Plan S/P Ex-lap, SB resection, appendectomy - 5/18 Dr Magnus Ivan - POD#4 - PT/OT, continue to get patient out of bed, ambulate - 1000 cc out of NG in last 24 hrs green-brown output.  - concern for GI bleeding - check stool for occult bleeding, continue IV protonix. Hold heparin. No clinical signs of acute blood loss. - Clamping trial with NGT  AKI - Creatinine improving, UOP increased. DM HTN CAD Acute on chronic Anemia - stable Hypernatremia - Na 143 today Hypokalemia - K 3.0 today  FEN - clamping trial, IVF, IV protonix, AM labs VTE - Hold SQ heparin, resume ASA ID- none.   LOS: 6 days    Wells Guiles , [facility]  Surgery 12/24/2016, 7:30 AM Pager: [phone] Consults: [phone] Mon-Fri 7:00 am-4:30 pm Sat-Sun 7:00 am-11:30 am

## 2016-12-24 NOTE — Progress Notes (Signed)
Patient resting in bed with no complaint of pain.

## 2016-12-24 NOTE — Progress Notes (Signed)
Physical Therapy Treatment Patient Details Name: Sonya Chavez MRN: [id number] DOB: 08-18-1925 Today's Date: 12/24/2016    History of Present Illness Pt is an 83 y/o female admitted secondary to AMS, now s/p ex lap for an SBO and appendectomy on 5/18. PMH including but not limited to DM, HTN and MI in 2003.    PT Comments    Patient with improved mobility and gait.  Need to attempt stairs at next session.   Follow Up Recommendations  No PT follow up;Supervision/Assistance - 24 hour     Equipment Recommendations  None recommended by PT    Recommendations for Other Services       Precautions / Restrictions Precautions Precautions: Fall Restrictions Weight Bearing Restrictions: No    Mobility  Bed Mobility Overal bed mobility: Needs Assistance Bed Mobility: Supine to Sit;Sit to Supine     Supine to sit: Supervision Sit to supine: Supervision   General bed mobility comments: Supervision for safety only.  Increased time required.  Transfers Overall transfer level: Needs assistance Equipment used: Rolling walker (2 wheeled) Transfers: Sit to/from Stand Sit to Stand: Supervision         General transfer comment: Uses good hand placement.  Supervision for safety only from bed and toilet.  Ambulation/Gait Ambulation/Gait assistance: Min guard Ambulation Distance (Feet): 280 Feet Assistive device: Rolling walker (2 wheeled) Gait Pattern/deviations: Step-through pattern;Decreased stride length;Trunk flexed Gait velocity: decreased Gait velocity interpretation: Below normal speed for age/gender General Gait Details: Patient with slow, steady gait with RW.  Min guard for balance/safety.   Stairs            Wheelchair Mobility    Modified Rankin (Stroke Patients Only)       Balance   Sitting-balance support: No upper extremity supported;Feet supported Sitting balance-Leahy Scale: Good     Standing balance support: No upper extremity  supported Standing balance-Leahy Scale: Fair Standing balance comment: Able to maintain static balance with no UE support.  For dynamic activities/gait, requires support of BUE's.                            Cognition Arousal/Alertness: Awake/alert Behavior During Therapy: WFL for tasks assessed/performed Overall Cognitive Status: Within Functional Limits for tasks assessed                                        Exercises      General Comments        Pertinent Vitals/Pain Pain Assessment: No/denies pain    Home Living                      Prior Function            PT Goals (current goals can now be found in the care plan section) Acute Rehab PT Goals Patient Stated Goal: return home and to PLOF Progress towards PT goals: Progressing toward goals    Frequency    Min 3X/week      PT Plan Current plan remains appropriate    Co-evaluation              AM-PAC PT "6 Clicks" Daily Activity  Outcome Measure  Difficulty turning over in bed (including adjusting bedclothes, sheets and blankets)?: A Little Difficulty moving from lying on back to sitting on the side of the bed? : A Little  Difficulty sitting down on and standing up from a chair with arms (e.g., wheelchair, bedside commode, etc,.)?: None Help needed moving to and from a bed to chair (including a wheelchair)?: A Little Help needed walking in hospital room?: A Little Help needed climbing 3-5 steps with a railing? : A Lot 6 Click Score: 18    End of Session Equipment Utilized During Treatment: Gait belt Activity Tolerance: Patient tolerated treatment well Patient left: in bed;with call bell/phone within reach;with family/visitor present Nurse Communication: Mobility status PT Visit Diagnosis: Unsteadiness on feet (R26.81);Other abnormalities of gait and mobility (R26.89)     Time: [id number] PT Time Calculation (min) (ACUTE ONLY): 16 min  Charges:  $Gait  Training: 8-22 mins                    G Codes:       Durenda HurtSusan H. Renaldo Fiddleravis, PT, [facility] Acute Rehab Services Pager [phone]    Sonya Chavez 12/24/2016, 4:31 PM

## 2016-12-25 LAB — GLUCOSE, CAPILLARY
GLUCOSE-CAPILLARY: 199 mg/dL — AB (ref 65–99)
GLUCOSE-CAPILLARY: 147 mg/dL — AB (ref 65–99)
GLUCOSE-CAPILLARY: 177 mg/dL — AB (ref 65–99)
Glucose-Capillary: 136 mg/dL — ABNORMAL HIGH (ref 65–99)
Glucose-Capillary: 178 mg/dL — ABNORMAL HIGH (ref 65–99)

## 2016-12-25 LAB — BASIC METABOLIC PANEL
Anion gap: 5 (ref 5–15)
BUN: 13 mg/dL (ref 6–20)
CO2: 32 mmol/L (ref 22–32)
Calcium: 7.3 mg/dL — ABNORMAL LOW (ref 8.9–10.3)
Chloride: 102 mmol/L (ref 101–111)
Creatinine, Ser: 1 mg/dL (ref 0.44–1.00)
GFR calc Af Amer: 56 mL/min — ABNORMAL LOW (ref >60)
GFR calc non Af Amer: 48 mL/min — ABNORMAL LOW (ref >60)
Glucose, Bld: 189 mg/dL — ABNORMAL HIGH (ref 65–99)
Potassium: 3.4 mmol/L — ABNORMAL LOW (ref 3.5–5.1)
Sodium: 139 mmol/L (ref 135–145)

## 2016-12-25 LAB — CBC
HEMATOCRIT: 28.8 % — AB (ref 36.0–46.0)
HEMOGLOBIN: 9.1 g/dL — AB (ref 12.0–15.0)
MCH: 24.3 pg — ABNORMAL LOW (ref 26.0–34.0)
MCHC: 31.6 g/dL (ref 30.0–36.0)
MCV: 77 fL — AB (ref 78.0–100.0)
Platelets: 161 10*3/uL (ref 150–400)
RBC: 3.74 MIL/uL — AB (ref 3.87–5.11)
RDW: 16.9 % — ABNORMAL HIGH (ref 11.5–15.5)
WBC: 8.3 10*3/uL (ref 4.0–10.5)

## 2016-12-25 MED ORDER — POTASSIUM CHLORIDE 20 MEQ/15ML (10%) PO SOLN
40.0000 meq | Freq: Once | ORAL | Status: AC
  Administered 2016-12-25: 40 meq via ORAL
  Filled 2016-12-25: qty 30

## 2016-12-25 MED ORDER — POTASSIUM CHLORIDE ER 10 MEQ PO TBCR: 20.0000 meq | EXTENDED_RELEASE_TABLET | Freq: Every day | ORAL | 0 refills | Status: DC

## 2016-12-25 MED ORDER — PANTOPRAZOLE SODIUM 40 MG PO TBEC: 40.0000 mg | DELAYED_RELEASE_TABLET | Freq: Every day | ORAL | Status: DC

## 2016-12-25 NOTE — Discharge Instructions (Signed)
Exploratory Laparotomy, Adult, Care After °Refer to this sheet in the next few weeks. These instructions provide you with information about caring for yourself after your procedure. Your health care provider may also give you more specific instructions. Your treatment has been planned according to current medical practices, but problems sometimes occur. Call your health care provider if you have any problems or questions after your procedure. °What can I expect after the procedure? °After your procedure, it is typical to have: °· Abdominal soreness. °· Fatigue. °· A sore throat from tubes in your throat. °· A lack of appetite. °Follow these instructions at home: °Medicines  °· Take medicines only as directed by your health care provider. °· Do not drive or operate heavy machinery while taking pain medicine. °Incision care  °· There are many different ways to close and cover an incision, including stitches (sutures), skin glue, and adhesive strips. Follow your health care provider's instructions about: °¨ Incision care. °¨ Bandage (dressing) changes and removal. °¨ Incision closure removal. °· Do not take showers or baths until your health care provider says that you can. °· Check your incision area daily for signs of infection. Watch for: °¨ Redness. °¨ Tenderness. °¨ Swelling. °¨ Drainage. °Activity  °· Do not lift anything that is heavier than 10 pounds (4.5 kg) until your health care provider says that it is safe. °· Try to walk a little bit each day if your health care provider says that it is okay. °· Ask your health care provider when you can start to do your usual activities again, such as driving, going back to work, and having sex. °Eating and drinking  °· You may eat what you usually eat. Include lots of whole grains, fruits, and vegetables in your diet. This will help to prevent constipation. °· Drink enough fluid to keep your urine clear or pale yellow. °General instructions  °· Keep all follow-up visits  as directed by your health care provider. This is important. °Contact a health care provider if: °· You have a fever. °· You have chills. °· Your pain medicine is not helping. °· You have constipation or diarrhea. °· You have nausea or vomiting. °· You have drainage, redness, swelling, or pain at your incision site. °Get help right away if: °· Your pain is getting worse. °· It has been more than 3 days since you been able to have a bowel movement. °· You have ongoing (persistent) vomiting. °· The edges of your incision open up. °· You have warmth, tenderness, and swelling in your calf. °· You have trouble breathing. °· You have chest pain. °This information is not intended to replace advice given to you by your health care provider. Make sure you discuss any questions you have with your health care provider. °Document Released: 03/05/2004 Document Revised: 12/28/2015 Document Reviewed: 03/09/2014 °Elsevier Interactive Patient Education © 2017 [employer] ° °

## 2016-12-25 NOTE — Progress Notes (Signed)
[facility] Progress Note  5 Days Post-Op  Subjective: CC: No complaints Patient feels good. Tolerated fulls without N/V, abdominal pain. Last BM yesterday. UOP good. VSS.  Objective: Vital signs in last 24 hours: Temp:  [98 F (36.7 C)-98.8 F (37.1 C)] 98.3 F (36.8 C) (05/23 0453) Pulse Rate:  [80-89] 89 (05/23 0453) Resp:  [16-18] 16 (05/23 0453) BP: (120-153)/(61-82) 120/82 (05/23 0453) SpO2:  [91 %-99 %] 99 % (05/23 0453) Last BM Date: 12/23/16  Intake/Output from previous day: 05/22 0701 - 05/23 0700 In: 2535.8 [P.O.:462; I.V.:1773.8; IV Piggyback:300] Out: 750 [Urine:750] Intake/Output this shift: No intake/output data recorded.  PE: Gen:  Alert, NAD, pleasant Card:  Regular rate and rhythm Pulm:  Normal effort, clear to auscultation bilaterally Abd: Soft, non-tender, non-distended, bowel sounds present in all 4 quadrants, incisions C/D/I Skin: warm and dry, no rashes  Psych: A&Ox3   Lab Results:   Recent Labs  12/23/16 0426 12/25/16 0545  WBC 9.5 8.3  HGB 9.2* 9.1*  HCT 29.2* 28.8*  PLT 181 161   BMET  Recent Labs  12/24/16 0533 12/25/16 0545  NA 143 139  K 3.0* 3.4*  CL 101 102  CO2 33* 32  GLUCOSE 124* 189*  BUN 16 13  CREATININE 0.89 1.00  CALCIUM 7.4* 7.3*   PT/INR No results for input(s): LABPROT, INR in the last 72 hours. CMP     Component Value Date/Time   NA 139 12/25/2016 0545   K 3.4 (L) 12/25/2016 0545   CL 102 12/25/2016 0545   CO2 32 12/25/2016 0545   GLUCOSE 189 (H) 12/25/2016 0545   BUN 13 12/25/2016 0545   CREATININE 1.00 12/25/2016 0545   CALCIUM 7.3 (L) 12/25/2016 0545   PROT 6.2 (L) 12/17/2016 2310   ALBUMIN 3.6 12/17/2016 2310   AST 16 12/17/2016 2310   ALT 9 (L) 12/17/2016 2310   ALKPHOS 52 12/17/2016 2310   BILITOT 0.9 12/17/2016 2310   GFRNONAA 48 (L) 12/25/2016 0545   GFRAA 56 (L) 12/25/2016 0545   Lipase     Component Value Date/Time   LIPASE 12 12/17/2016 2301     Anti-infectives: Anti-infectives    Start     Dose/Rate Route Frequency Ordered Stop   12/20/16 1127  dextrose 5 % with cefOXitin (MEFOXIN) ADS Med  Status:  Discontinued    Comments:  Lorenda IshiharaGibbs, Bonnie   : cabinet override      12/20/16 1127 12/20/16 1134   12/20/16 0730  cefOXitin (MEFOXIN) 1 g in dextrose 5 % 50 mL IVPB     1 g 100 mL/hr over 30 Minutes Intravenous To Short Stay 12/20/16 0715 12/20/16 1309       Assessment/Plan S/P Ex-lap, SB resection, appendectomy - 5/18 Dr Magnus IvanBlackman - POD#5 - PT/OT, continue to get patient out of bed, ambulate - NGT removed yesterday - patient tolerating full liquids - advance diet to soft - patient can go this afternoon if tolerating soft diet   AKI- Creatinine stable, UOP good. DM HTN CAD Acute on chronic Anemia- stable Hypernatremia- resolved, Na 139 Hypokalemia - improving, K 3.4 today  FEN- soft diet, IVF, IV protonix VTE- Hold SQ heparin, ASA, SCDs ID- none.   LOS: 7 days    Wells GuilesKelly Rayburn , [facility] 12/25/2016, 7:43 AM Pager: [phone] Consults: [phone] Mon-Fri 7:00 am-4:30 pm Sat-Sun 7:00 am-11:30 am

## 2016-12-25 NOTE — Discharge Summary (Addendum)
[place] Sony[place] Chavez, is a 83 y.o. female  DOB [birth year]  MRN [id number].  Admission date:  12/17/2016  Admitting Physician  Hillary Bow, DO  Discharge Date:  12/25/2016   Primary MD  Alysia Penna, MD  Recommendations for primary care physician for things to follow:  - Please check CBC, BMP during next visit - Patient instructed to keep her follow-up with surgery for staple removals, and postoperative outpatient follow-up.   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 Arthritis    "qwhere"  . Coronary artery disease   . Hypercholesteremia   . Hypertension   . Myocardial infarction (HCC) 2003  . SBO (small bowel obstruction) (HCC) 12/18/2016  . Stenosis of subclavian artery (HCC)   . Type II diabetes mellitus (HCC)     Past Surgical History:  Procedure Laterality Date  . ABDOMINAL HYSTERECTOMY  1970  . BOWEL RESECTION N/A 12/20/2016   Procedure: SMALL BOWEL RESECTION;  Surgeon: Abigail Miyamoto, MD;  Location: [facility] OR;  Service: General;  Laterality: N/A;  . CARDIAC CATHETERIZATION    . CATARACT EXTRACTION Bilateral 2000's  . CORONARY ARTERY BYPASS GRAFT  2003   in [place], [place]  . LAPAROTOMY N/A 12/20/2016   Procedure: EXPLORATORY LAPAROTOMY;  Surgeon: Abigail Miyamoto, MD;  Location: [facility];  Service:  General;  Laterality: N/A;  . LEFT HEART CATHETERIZATION WITH CORONARY/GRAFT ANGIOGRAM N/A 09/27/2014   Procedure: LEFT HEART CATHETERIZATION WITH Isabel Caprice;  Surgeon: Pamella Pert, MD;  Location[facility] C[facility] LAB;  Service: Cardiovascular;  Laterality: N/A;  . UNILATERAL UPPER EXTREMEITY ANGIOGRAM N/A 09/27/2014   Procedure: SUBCLAVIAN ARTERIOGRAM ;  Surgeon: Pamella Pert, MD;  Location: [facility] CATH LAB;  Service: Cardiovascular;  Laterality: N/A;  . UNILATERAL UPPER EXTREMEITY ANGIOGRAM N/A 11/09/2014   Procedure: Left subclavian stent ;  Surgeon: Yates Decamp, MD;  Location: [facility] CATH LAB;  Service: Cardiovascular;  Laterality: N/A;  . VISCERAL ANGIOGRAM N/A 10/25/2014   Procedure: Laurence Slate;  Surgeon: Yates Decamp, MD;  Location: [facility] CATH LAB;  Service: Cardiovascular;  Laterality: N/A;       History of present illness and  Hospital Course:     Kindly see H&P for history of present illness and admission details, please review complete Labs, Consult reports and Test reports for all details in brief  HPI  from the history and physical done on the day of admission 12/18/2016  HPI: Sonya Chavez is a 83 y.o. female with medical history significant of DM2,  MI.  Patient is brought in by family after having AMS this afternoon.  Patient was apparently confused and was asking questions repeatedly.  She denies any pain, fever, chills, cough.  When asked about vomiting says "not too much".  She denies abd pain, however family states that she was having abd pain around 4am yesterday when she was up trying to use bathroom.  She states she had a small BM at that time.  Per family members at bedside current mental status is actually close to baseline.   ED Course: WBC 19k, CT abd/pelvis shows high grade SBO with dilation of small bowel up to 3.2cm, also shows porcelain gallbladder.  Pericholecystic fluid that is favored to be from SBO over acute cholecystitis.  LFTs are WNL.  Creat  of 1.28 is baseline (same in 2016 and 2017).    Hospital Course   Small bowel obstruction - Continue to small bowel infarction and closed loop bowel obstruction ,  Status post expiratory laparotomy, small bowel resection, appendectomy, management per peritoneal surgery, initially not clear liquid diet, advance to soft diet today which he tolerated very well, follow up with surgery as an outpatient.   Hypokalemia - Repleted, we'll discharge on supplements.  Acute kidney injury - Resolved, back to baseline  Hypernatremia -  secondary to volume depletion, improved, sodium is 139 on discharge .   Porcelain Gallbladder - Gen. surgery not recommending surgery at this time.  Diabetes mellitus - Acceptable on  insulin sliding scale during hospital stay, resume metformin on discharge .   Hypertension - Resume home medication on discharge.  CAD - stable, continue aspirin  Leukocytosis -resolved,  WBC 9.5 likely reactive ,chest x-ray negative for pneumonia, UA clear. Patient is afebrile    Discharge Condition:  Stable   Follow UP  Follow-up Information    [facility] Follow up on 01/03/2017.   Specialty:  General Surgery Why:  Your appointment for STAPLE REMOVAL is at 10:00 AM. Please arrive 30 min prior to appointment time for check-in. Bring your photo ID and insurance information with you.  Contact information: [address] [place] [address] [phone]        Abigail Miyamoto, MD Follow up on 01/14/2017.   Specialty:  General Surgery Why:  Your pos-op follow up appointment is at 10:20 AM. Please arrive by 10:00 AM for check in. Bring your photo ID and insurance information.  Contact information: [address] [place] [address] [phone]        Alysia Penna, MD Follow up in 1 week(s).   Specialty:  Internal Medicine Contact information: [address] [place] [address] [phone]              Discharge Instructions  and  Discharge Medications    Discharge Instructions    Discharge instructions    Complete by:  As directed    Follow with Primary MD Alysia Penna, MD in 7 days   Get CBC, CMP,  checked  by Primary MD next visit.    Activity: As tolerated with Full fall precautions use walker/cane & assistance as needed   Disposition Home    Diet: Heart Healthy , soft diet, carb modified , with feeding assistance and aspiration precautions.  For Heart failure patients - Check your Weight same time everyday, if you gain over 2 pounds, or you develop in leg swelling, experience more shortness of breath or chest pain, call your Primary MD immediately. Follow Cardiac  Low Salt Diet and 1.5 lit/day fluid restriction.   On your next visit with your primary care physician please Get Medicines reviewed and adjusted.   Please request your Prim.MD to go over all Hospital Tests and Procedure/Radiological results at the follow up, please get all Hospital records sent to your Prim MD by signing hospital release before you go home.   If you experience worsening of your admission symptoms, develop shortness of breath, life threatening emergency, suicidal or homicidal thoughts you must seek medical attention immediately by calling 911 or calling your MD immediately  if symptoms less severe.  You Must read complete instructions/literature along with all the possible adverse reactions/side effects for all the Medicines you take and that have been prescribed to you. Take any new Medicines after you have completely understood and accpet all the possible adverse reactions/side effects.   Do not drive, operating heavy machinery, perform activities at heights, swimming or participation in water activities or provide baby sitting services if your were admitted for syncope or siezures until you have seen by Primary MD or a Neurologist and advised to do so again.  Do not drive when taking Pain  medications.    Do not take more than prescribed Pain, Sleep and Anxiety Medications  Special Instructions: If you have smoked or chewed Tobacco  in the last 2 yrs please stop smoking, stop any regular Alcohol  and or any Recreational drug use.  Wear Seat belts while driving.   Please note  You were cared for by a hospitalist during your hospital stay. If you have any questions about your discharge medications or the care you received while you were in the hospital after you are discharged, you can call the unit and asked to speak with the hospitalist on call if the hospitalist that took care of you is not available. Once you are discharged, your primary care physician will handle any further medical issues. Please note that NO REFILLS for any discharge medications will be authorized once you are discharged, as it is imperative that you return to your primary care physician (or establish a relationship with a primary care physician if you do not have one) for your aftercare needs so that they can reassess your need for medications and monitor your lab values.   Increase activity slowly    Complete by:  As directed      Allergies as of 12/25/2016      Reactions   Crestor [rosuvastatin] Other (See Comments)   Myalgias   Lipitor [atorvastatin] Other (See Comments)   myalgias   Zocor [simvastatin] Other (See Comments)   myalgias      Medication List    TAKE these medications   acetaminophen 325 MG tablet Commonly known as:  TYLENOL Take 650 mg by mouth daily as needed (pain).   alendronate 70 MG tablet Commonly known as:  FOSAMAX Take 70 mg by mouth every Monday.   amLODipine 10 MG tablet Commonly known as:  NORVASC Take 10 mg by mouth at bedtime.   aspirin EC 81 MG tablet Take 81 mg by mouth at bedtime.   CALCIUM CITRATE + PO Take 1 tablet by mouth daily.   carvedilol 6.25 MG tablet Commonly known as:  COREG Take 12.5 mg by mouth 2 (two) times daily with a meal.     chlorthalidone 25 MG tablet Commonly known as:  HYGROTON Take 1 tablet (25 mg total) by mouth daily.   gabapentin 100 MG capsule Commonly known as:  NEURONTIN Take 200 mg by mouth at bedtime.   losartan 100 MG tablet Commonly known as:  COZAAR Take 100 mg by mouth daily.   metFORMIN 1000 MG tablet Commonly known as:  GLUCOPHAGE Take 1 tablet (1,000 mg total) by mouth 2 (two) times daily with a meal.   potassium chloride 10 MEQ tablet Commonly known as:  K-DUR Take 2 tablets (20 mEq total) by mouth daily.   Vitamin D3 2000 units capsule Take 8,000 Units by mouth daily.         Diet and Activity recommendation: See Discharge Instructions above   Consults obtained - General surgery    Major procedures and Radiology Reports - PLEASE review detailed and final reports for all details, in brief -     Dg Chest 2 View  Result Date: 12/18/2016 CLINICAL DATA:  Altered mental status. EXAM: CHEST  2 VIEW COMPARISON:  Radiographs 07/04/2015 FINDINGS: Post median sternotomy and CABG. Low lung volumes. Unchanged heart size and mediastinal contours with atherosclerosis of the aortic arch. Vascular stent projects over the superior mediastinum. No pulmonary edema. Mild bibasilar atelectasis. No confluent airspace disease. Possible trace left pleural effusion versus scarring. No pneumothorax. Degenerative change in the spine. IMPRESSION: Low lung volumes with bibasilar atelectasis. Post CABG with normal heart size with thoracic aortic atherosclerosis. Electronically Signed   By: Rubye Oaks Sonya.D.   On: 12/18/2016 00:00   Dg Abd 1 View  Result Date: 12/20/2016 CLINICAL DATA:  Small bowel obstruction EXAM: ABDOMEN - 1 VIEW COMPARISON:  Yesterday FINDINGS: Nasogastric tube tip overlaps the distal stomach. Unchanged diffuse gaseous and fluid distention of bowel. Previously administered oral contrast is retained within small bowel. No concerning mass effect. No Rigler's sign or other  specific pneumoperitoneum finding noted. Some oral contrast is seen within the bladder, possible trans mucosal absorption. IMPRESSION: Ongoing high-grade small bowel obstruction. Oral contrast is retained within the small bowel. Electronically Signed   By: Marnee Spring Sonya.D.   On: 12/20/2016 10:10   Dg Abd 1 View  Result Date: 12/19/2016 CLINICAL DATA:  Follow up small bowel obstruction EXAM: ABDOMEN - 1 VIEW COMPARISON:  12/18/2016 FINDINGS: Contrast material is again noted throughout the small bowel with multiple loops of dilated small bowel consistent with a high-grade small bowel obstruction. No contrast has passed into the colon. Stable bony structures are noted. Nasogastric catheter lies in the distal stomach. IMPRESSION: Stable changes of small-bowel obstruction. Contrast remains scattered throughout small bowel. Electronically Signed   By: Alcide Clever Sonya.D.   On: 12/19/2016 13:23   Ct Head Wo Contrast  Result Date: 12/18/2016 CLINICAL DATA:  Confusion EXAM: CT HEAD WITHOUT CONTRAST TECHNIQUE: Contiguous axial images were obtained from the base of the skull through the vertex without intravenous contrast. COMPARISON:  Brain MRI 03/09/2013 FINDINGS: Brain: No mass lesion, intraparenchymal hemorrhage or extra-axial collection. No evidence of acute cortical infarct. There is periventricular hypoattenuation compatible with chronic microvascular disease. Vascular: No hyperdense vessel or unexpected calcification. Skull: Normal visualized skull base, calvarium and extracranial soft tissues. Sinuses/Orbits: No sinus fluid levels or advanced mucosal thickening. No mastoid effusion. Normal orbits. IMPRESSION: Chronic microvascular ischemia without acute intracranial abnormality. Electronically Signed   By: Deatra Robinson Sonya.D.   On: 12/18/2016 01:19   Ct Abdomen Pelvis W Contrast  Result Date: 12/18/2016 CLINICAL DATA:  Periumbilical pain after eating for the past several days. History hysterectomy. EXAM:  CT ABDOMEN AND PELVIS WITH CONTRAST TECHNIQUE: Multidetector CT imaging of the abdomen and pelvis was  performed using the standard protocol following bolus administration of intravenous contrast. CONTRAST:  75mL ISOVUE-300 IOPAMIDOL (ISOVUE-300) INJECTION 61% COMPARISON:  None. FINDINGS: Lower chest: Trace bilateral pleural effusions. Borderline cardiomegaly with aortic atherosclerosis and coronary arteriosclerosis. The patient is status post CABG. No pericardial effusion. Hepatobiliary: Mural calcification of the gallbladder consistent with porcelain gallbladder. Several large calcified gallstones are seen within. The gallbladder is distended and there is pericholecystic fluid which is more likely related to the current small bowel obstruction as opposed to acute cholecystitis. No biliary dilatation or hepatic mass. Pancreas: Atrophic pancreas without ductal dilatation. No choledocholithiasis. Spleen: Normal size spleen with a few small indeterminate 6 mm hypodensities potentially representing tiny cysts or hemangiomata. There appears to be a small splenule adjacent to the spleen. Adrenals/Urinary Tract: Normal bilateral adrenal glands. Mild cortical thinning of both kidneys without obstructive uropathy. Indeterminate 1.5 cm left upper pole hypodense cystic appearing lesion. Smaller hypodensities are seen in the lower pole of the left kidney. No nephrolithiasis. The urinary bladder is physiologically distended. Stomach/Bowel: The stomach is contracted. There is normal small bowel rotation. Abnormal fluid-filled dilated loops of jejunum and proximal to mid ileum up to 3.2 cm in diameter are identified with mild-to-moderate segmental thickening of distal ileum possibly representing an inflammatory stricture from inflammatory bowel disease. The exact point of transition is not definitively identified. Large bowel is nondistended and there is extensive descending and sigmoid diverticulosis with circular muscle  hypertrophy noted. Vascular/Lymphatic: Moderate aorto bi-iliac and branch vessel atherosclerosis. Reproductive: Status post hysterectomy. No adnexal masses. Other: Small amount of free fluid is present within the abdomen pelvis. No free air. Coarse ventral subcutaneous pelvic calcification possibly from prior hysterectomy is noted. Musculoskeletal: Grade 1 anterolisthesis of L4 on L5 with mild disc space narrowing. Moderate disc space narrowing at L5-S1. No acute osseous abnormality. IMPRESSION: 1. Acute high-grade small bowel obstruction with long segmental thickening of the distal ileum possibly representing an inflammatory stricture or changes from inflammatory bowel disease contributing to the small bowel obstruction. The exact point of transition is not definitively identified. 2. 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 Pericholecystic fluid is more likely secondary to the small bowel obstruction and less likely due to acute cholecystitis. 3. Indeterminate left upper pole 1.5 cm cystic lesion of the kidney possibly complex cyst. Smaller hypodensities also statistically consistent with cysts but too small to further characterize are noted in the lower pole. 4. Aortoiliac atherosclerosis without aneurysm. Coronary arteriosclerosis. 5. Lower lumbar degenerative disc disease at L4-5 and L5-S1 with grade 1 anterolisthesis of L4 on L5. Electronically Signed   By: Tollie Eth Sonya.D.   On: 12/18/2016 01:30   US Renal  Result Date: 12/21/2016 CLINICAL DATA:  Acute kidney injury EXAM: RENAL / URINARY TRACT ULTRASOUND COMPLETE COMPARISON:  CT abdomen/ pelvis dated 12/18/2016 FINDINGS: Right Kidney: Length: Approximately 8.1 cm. Poorly visualized. No gross hydronephrosis. Left Kidney: Length: 8.9 cm.  No mass or hydronephrosis. Bladder: Not discretely visualized.  Indwelling catheter. Additional comments: Cholelithiasis with mural wall calcifications, better evaluated on CT. Mild  pericholecystic fluid. IMPRESSION: Kidneys are poorly evaluated.  No hydronephrosis. Bladder not discretely visualized.  Indwelling catheter. Cholelithiasis with mural calcifications and mild pericholecystic fluid, better evaluated on recent CT. Electronically Signed   By: Charline Bills Sonya.D.   On: 12/21/2016 21:48   Dg Chest Port 1 View  Result Date: 12/21/2016 CLINICAL DATA:  Acute renal injury EXAM: PORTABLE CHEST 1 VIEW COMPARISON:  12/17/2016 FINDINGS: Cardiac shadow is mildly enlarged. Nasogastric catheter  is noted within the stomach. Postsurgical changes are seen. The lungs are hypoinflated with some mild left basilar atelectasis slightly increased from the prior exam. No other focal abnormality is noted. IMPRESSION: Increasing left basilar atelectasis with poor inspiratory effort. Electronically Signed   By: Alcide CleverMark  Lukens Sonya.D.   On: 12/21/2016 19:13   Dg Abd Portable 1v-small Bowel Obstruction Protocol-initial, 8 Hr Delay  Result Date: 12/18/2016 CLINICAL DATA:  Small bowel obstruction protocol. 8 hour delayed image. EXAM: PORTABLE ABDOMEN - 1 VIEW COMPARISON:  12/18/2016 at 9:59 a.Sonya. FINDINGS: 8 hours following injection of oral contrast through the nasogastric tube, an AP supine radiograph of the abdomen was acquired. Nasogastric tube curls within distal stomach. There is residual IV contrast within the bladder. IMPRESSION: 1. Moderate to high-grade small bowel obstruction. Contrast has progressed into the small bowel but does not extend into the colon. Contrast primarily is within the jejunum. Electronically Signed   By: Amie Portlandavid  Ormond Sonya.D.   On: 12/18/2016 20:47   Dg Abd Portable 1v-small Bowel Protocol-position Verification  Result Date: 12/18/2016 CLINICAL DATA:  NG tube placement. EXAM: PORTABLE ABDOMEN - 1 VIEW COMPARISON:  CT 12/18/2016. FINDINGS: Prior median sternotomy. NG tube noted with its tip projected over the distal stomach. Porcelain gallbladder and gallstones again noted.  Persistent dilated loops of small bowel are noted consistent with small bowel obstruction as previously demonstrated on CT of 12/18/2016 . No free air. Contrast is noted in the bladder. Possible bladder diverticulum present. Aortoiliac atherosclerotic vascular disease. Degenerative changes thoracolumbar spine. IMPRESSION: 1. NG tube noted with its tip projected over the distal stomach. Persistent distended loops of small bowel consistent with small bowel obstruction as previously identified on CT of 12/18/2016. 2.  Porcelain gallbladder/gallstones again noted. 3. Aortoiliac atherosclerotic vascular disease. Electronically Signed   By: Maisie Fushomas  Register   On: 12/18/2016 10:21    Micro Results     Recent Results (from the past 240 hour(s))  Surgical pcr screen     Status: None   Collection Time: 12/20/16  7:16 AM  Result Value Ref Range Status   MRSA, PCR NEGATIVE NEGATIVE Final   Staphylococcus aureus NEGATIVE NEGATIVE Final    Comment:        The Xpert SA Assay (FDA approved for NASAL specimens in patients over 81 years of age), is one component of a comprehensive surveillance program.  Test performance has been validated by [facility] for patients greater than or equal to 81 year old. It is not intended to diagnose infection nor to guide or monitor treatment.        Today   Subjective:   [place] today has no headache,no chest or abdominal pain, tolerated full liquids with no nausea and vomiting, advanced to soft diet which she had tolerated well .  Objective:   Blood pressure (!) 121/58, pulse 84, temperature 98.3 F (36.8 C), temperature source Oral, resp. rate 16, height 5\' 2"  (1.575 Sonya), weight 55 kg (121 lb 3.2 oz), SpO2 100 %.   Intake/Output Summary (Last 24 hours) at 12/25/16 1332 Last data filed at 12/25/16 1041  Gross per 24 hour  Intake          2655.75 ml  Output              550 ml  Net          2105.75 ml    Exam Awake Alert, Oriented x 3,  No new F.N deficits, Normal affect Symmetrical Chest wall movement,  Good air movement bilaterally, CTAB RRR,No Gallops,Rubs or new Murmurs, No Parasternal Heave +ve B.Sounds, Abd Soft, Midline surgical wound  with staples, incisions C/D/I,  No rebound -guarding or rigidity. No Cyanosis, Clubbing , +1 edema B/L  Data Review   CBC w Diff:  Lab Results  Component Value Date   WBC 8.3 12/25/2016   HGB 9.1 (L) 12/25/2016   HCT 28.8 (L) 12/25/2016   PLT 161 12/25/2016   LYMPHOPCT 6 12/17/2016   MONOPCT 12 12/17/2016   EOSPCT 0 12/17/2016   BASOPCT 0 12/17/2016    CMP:  Lab Results  Component Value Date   NA 139 12/25/2016   K 3.4 (L) 12/25/2016   CL 102 12/25/2016   CO2 32 12/25/2016   BUN 13 12/25/2016   CREATININE 1.00 12/25/2016   PROT 6.2 (L) 12/17/2016   ALBUMIN 3.6 12/17/2016   BILITOT 0.9 12/17/2016   ALKPHOS 52 12/17/2016   AST 16 12/17/2016   ALT 9 (L) 12/17/2016  .   Total Time in preparing paper work, data evaluation and todays exam - 35 minutes  Janica Eldred Sonya.D on 12/25/2016 at 1:32 PM  [facility]   Office  [phone]

## 2016-12-25 NOTE — Progress Notes (Signed)
Discharge home. Home discharge instruction given, no question verbalized. 

## 2016-12-25 NOTE — Consult Note (Signed)
Sonya Chavez  12/25/2016  Sonya Chavez 1925-08-18 [id number]   Met with patient and son Sonya Chavez) at the bedside to identify possible discharge needs. Patient and son reports having constant abdominal pain and confusion that had led to this admission/ surgery.  Patient endorses Dr. Velna Hatchet with [facility] as the primary care provider.   Patient shared using [facility] and [facility] service to obtain medications without difficulty.   Patient reports managing his own medications at home with use of "pill box" system weekly.  Patient states that her husband Sonya Chavez) provides transportation to her doctors' appointments.  Patient's husband is her primary caregiver at homeand her sister will be in and out helping with patient as well. Her son who is visiting from out of state will be staying with her to assist for few days as she recovers.  Discharge plan is home per RN report.  Patientand son expressedunderstanding to call primary care provider's officewhen she returns back home,for a post discharge follow-up appointment within a week or sooner if needed.Patient letter (with PCP's contact number) was provided as areminder.  Explained to patient and son about [facility] CM services available for health management but patient states thatit is under control and being managed well at home. Primary care provider had been monitoring A1C every 3 months and helping patient in managing DM as stated.  Patient expressed understanding to request a referral to [facility] care management from primary care provider if deemed necessary/ appropriate for services in the future. [facility] care management contact information provided for future needs that may arise.   For questions, please contact:  Dannielle Huh, BSN, RN- [facility]  Telephone: [phone] [place]

## 2016-12-25 NOTE — Progress Notes (Signed)
Occupational Therapy Treatment Patient Details Name: Sonya Chavez MRN: [id number] DOB: 1926-04-19 Today's Date: 12/25/2016    History of present illness Pt is an 83 y/o female admitted secondary to AMS, now s/p ex lap for an SBO and appendectomy on 5/18. PMH including but not limited to DM, HTN and MI in 2003.   OT comments  Pt demonstrates balance deficits with basic transfers this session by lifting RW on one side. Pt abandoned RW twice during session and needed cues to return to RW. Pt currently progression to sink level standing for grooming and adls.   Follow Up Recommendations  No OT follow up;Supervision/Assistance - 24 hour    Equipment Recommendations  3 in 1 bedside commode    Recommendations for Other Services      Precautions / Restrictions Precautions Precautions: Fall       Mobility Bed Mobility Overal bed mobility: Needs Assistance Bed Mobility: Supine to Sit     Supine to sit: Supervision     General bed mobility comments: heavy use of bed rail and hob elevated slightly  Transfers Overall transfer level: Needs assistance Equipment used: Rolling walker (2 wheeled) Transfers: Sit to/from Stand Sit to Stand: Min guard         General transfer comment: cues for hand placement    Balance             Standing balance-Leahy Scale: Fair                             ADL either performed or assessed with clinical judgement   ADL Overall ADL's : Needs assistance/impaired     Grooming: Oral care;Standing Grooming Details (indicate cue type and reason): needed (A) to placement of RW. pt abandoned RW                 Toilet Transfer: Minimal assistance           Functional mobility during ADLs: Minimal assistance;Rolling walker General ADL Comments: pt when transfering lifting RW on one side. Pt needed cues to keep RW on the ground.      Vision       Perception     Praxis      Cognition Arousal/Alertness:  Awake/alert Behavior During Therapy: WFL for tasks assessed/performed Overall Cognitive Status: Within Functional Limits for tasks assessed                                          Exercises     Shoulder Instructions       General Comments      Pertinent Vitals/ Pain       Pain Assessment: No/denies pain  Home Living                                          Prior Functioning/Environment              Frequency  Min 2X/week        Progress Toward Goals  OT Goals(current goals can now be found in the care plan section)  Progress towards OT goals: Progressing toward goals  Acute Rehab OT Goals Patient Stated Goal: return home and to PLOF OT Goal Formulation: With patient Time For Goal Achievement:  01/05/17 Potential to Achieve Goals: Good ADL Goals Pt Will Perform Grooming: with modified independence;standing Pt Will Perform Lower Body Bathing: sit to/from stand;with supervision Pt Will Perform Lower Body Dressing: sit to/from stand;with supervision Pt Will Transfer to Toilet: with supervision;ambulating;regular height toilet Pt Will Perform Toileting - Clothing Manipulation and hygiene: with supervision;sit to/from stand Pt Will Perform Tub/Shower Transfer: Shower transfer;with supervision;ambulating;shower seat  Plan Discharge plan remains appropriate    Co-evaluation                 AM-PAC PT "6 Clicks" Daily Activity     Outcome Measure   Help from another person eating meals?: A Little Help from another person taking care of personal grooming?: A Little Help from another person toileting, which includes using toliet, bedpan, or urinal?: A Little Help from another person bathing (including washing, rinsing, drying)?: A Little Help from another person to put on and taking off regular upper body clothing?: A Little Help from another person to put on and taking off regular lower body clothing?: A Little 6 Click  Score: 18    End of Session Equipment Utilized During Treatment: Rolling walker;Oxygen  OT Visit Diagnosis: Unsteadiness on feet (R26.81)   Activity Tolerance Patient tolerated treatment well   Patient Left in chair;with call bell/phone within reach;with chair alarm set;with family/visitor present   Nurse Communication Mobility status;Precautions        Time: 1191-4782 OT Time Calculation (min): 12 min  Charges: OT General Charges $OT Visit: 1 Procedure OT Treatments $Self Care/Home Management : 8-22 mins   Mateo Flow   OTR/L Pager: [phone] Office: [phone] .    Boone Master B 12/25/2016, 2:18 PM

## 2016-12-27 DIAGNOSIS — R197 Diarrhea, unspecified: Secondary | ICD-10-CM | POA: Diagnosis not present

## 2016-12-27 DIAGNOSIS — K56699 Other intestinal obstruction unspecified as to partial versus complete obstruction: Secondary | ICD-10-CM | POA: Diagnosis not present

## 2017-01-06 DIAGNOSIS — E119 Type 2 diabetes mellitus without complications: Secondary | ICD-10-CM | POA: Diagnosis not present

## 2017-01-06 DIAGNOSIS — E784 Other hyperlipidemia: Secondary | ICD-10-CM | POA: Diagnosis not present

## 2017-01-06 DIAGNOSIS — E1140 Type 2 diabetes mellitus with diabetic neuropathy, unspecified: Secondary | ICD-10-CM | POA: Diagnosis not present

## 2017-01-06 DIAGNOSIS — I10 Essential (primary) hypertension: Secondary | ICD-10-CM | POA: Diagnosis not present

## 2017-01-06 DIAGNOSIS — M810 Age-related osteoporosis without current pathological fracture: Secondary | ICD-10-CM | POA: Diagnosis not present

## 2017-01-06 DIAGNOSIS — Z0000 Encounter for general adult medical examination without abnormal findings: Secondary | ICD-10-CM | POA: Diagnosis not present

## 2017-01-09 ENCOUNTER — Ambulatory Visit (INDEPENDENT_AMBULATORY_CARE_PROVIDER_SITE_OTHER): Payer: Medicare Other | Admitting: Podiatry

## 2017-01-09 DIAGNOSIS — M79676 Pain in unspecified toe(s): Secondary | ICD-10-CM

## 2017-01-09 DIAGNOSIS — M79604 Pain in right leg: Secondary | ICD-10-CM

## 2017-01-09 DIAGNOSIS — B351 Tinea unguium: Secondary | ICD-10-CM

## 2017-01-09 DIAGNOSIS — M79605 Pain in left leg: Secondary | ICD-10-CM

## 2017-01-09 NOTE — Progress Notes (Signed)
Subjective:    Patient ID: [place] Racz, female   DOB: 83 y.o.   MRN: [id number]   HPI patient presents with elongated thickened nailbeds 1-5 both feet that are painful    ROS      Objective:  Physical Exam neurovascular status tach with thick yellow brittle nailbeds 1-5 both feet that are painful when palpated     Assessment:    Mycotic nail infection with pain 1-5 both feet     Plan:    Debris painful nailbeds 1-5 both feet with no iatrogenic bleeding noted

## 2017-01-13 DIAGNOSIS — Z0000 Encounter for general adult medical examination without abnormal findings: Secondary | ICD-10-CM | POA: Diagnosis not present

## 2017-01-13 DIAGNOSIS — R634 Abnormal weight loss: Secondary | ICD-10-CM | POA: Diagnosis not present

## 2017-01-13 DIAGNOSIS — Z1389 Encounter for screening for other disorder: Secondary | ICD-10-CM | POA: Diagnosis not present

## 2017-01-13 DIAGNOSIS — M810 Age-related osteoporosis without current pathological fracture: Secondary | ICD-10-CM | POA: Diagnosis not present

## 2017-01-13 DIAGNOSIS — E1140 Type 2 diabetes mellitus with diabetic neuropathy, unspecified: Secondary | ICD-10-CM | POA: Diagnosis not present

## 2017-01-13 DIAGNOSIS — K56699 Other intestinal obstruction unspecified as to partial versus complete obstruction: Secondary | ICD-10-CM | POA: Diagnosis not present

## 2017-01-13 DIAGNOSIS — N281 Cyst of kidney, acquired: Secondary | ICD-10-CM | POA: Diagnosis not present

## 2017-01-13 DIAGNOSIS — E784 Other hyperlipidemia: Secondary | ICD-10-CM | POA: Diagnosis not present

## 2017-01-13 DIAGNOSIS — E119 Type 2 diabetes mellitus without complications: Secondary | ICD-10-CM | POA: Diagnosis not present

## 2017-01-13 DIAGNOSIS — I6522 Occlusion and stenosis of left carotid artery: Secondary | ICD-10-CM | POA: Diagnosis not present

## 2017-01-13 DIAGNOSIS — I25118 Atherosclerotic heart disease of native coronary artery with other forms of angina pectoris: Secondary | ICD-10-CM | POA: Diagnosis not present

## 2017-01-13 DIAGNOSIS — Z6823 Body mass index (BMI) 23.0-23.9, adult: Secondary | ICD-10-CM | POA: Diagnosis not present

## 2017-01-13 DIAGNOSIS — I10 Essential (primary) hypertension: Secondary | ICD-10-CM | POA: Diagnosis not present

## 2017-01-23 DIAGNOSIS — M1990 Unspecified osteoarthritis, unspecified site: Secondary | ICD-10-CM | POA: Diagnosis not present

## 2017-01-23 DIAGNOSIS — K56699 Other intestinal obstruction unspecified as to partial versus complete obstruction: Secondary | ICD-10-CM | POA: Diagnosis not present

## 2017-01-23 DIAGNOSIS — Z6823 Body mass index (BMI) 23.0-23.9, adult: Secondary | ICD-10-CM | POA: Diagnosis not present

## 2017-01-23 DIAGNOSIS — E119 Type 2 diabetes mellitus without complications: Secondary | ICD-10-CM | POA: Diagnosis not present

## 2017-01-23 DIAGNOSIS — R197 Diarrhea, unspecified: Secondary | ICD-10-CM | POA: Diagnosis not present

## 2017-01-23 DIAGNOSIS — I10 Essential (primary) hypertension: Secondary | ICD-10-CM | POA: Diagnosis not present

## 2017-01-23 DIAGNOSIS — D6489 Other specified anemias: Secondary | ICD-10-CM | POA: Diagnosis not present

## 2017-01-23 DIAGNOSIS — E784 Other hyperlipidemia: Secondary | ICD-10-CM | POA: Diagnosis not present

## 2017-01-29 DIAGNOSIS — I25119 Atherosclerotic heart disease of native coronary artery with unspecified angina pectoris: Secondary | ICD-10-CM | POA: Diagnosis not present

## 2017-01-29 DIAGNOSIS — I6523 Occlusion and stenosis of bilateral carotid arteries: Secondary | ICD-10-CM | POA: Diagnosis not present

## 2017-01-29 DIAGNOSIS — I771 Stricture of artery: Secondary | ICD-10-CM | POA: Diagnosis not present

## 2017-01-29 DIAGNOSIS — I10 Essential (primary) hypertension: Secondary | ICD-10-CM | POA: Diagnosis not present

## 2017-04-11 ENCOUNTER — Encounter: Payer: Self-pay | Admitting: Podiatry

## 2017-04-11 ENCOUNTER — Ambulatory Visit (INDEPENDENT_AMBULATORY_CARE_PROVIDER_SITE_OTHER): Payer: Medicare Other | Admitting: Podiatry

## 2017-04-11 DIAGNOSIS — M79676 Pain in unspecified toe(s): Secondary | ICD-10-CM

## 2017-04-11 DIAGNOSIS — B351 Tinea unguium: Secondary | ICD-10-CM | POA: Diagnosis not present

## 2017-04-14 DIAGNOSIS — I10 Essential (primary) hypertension: Secondary | ICD-10-CM | POA: Diagnosis not present

## 2017-04-14 DIAGNOSIS — Z6823 Body mass index (BMI) 23.0-23.9, adult: Secondary | ICD-10-CM | POA: Diagnosis not present

## 2017-04-14 DIAGNOSIS — R600 Localized edema: Secondary | ICD-10-CM | POA: Diagnosis not present

## 2017-04-14 DIAGNOSIS — E1140 Type 2 diabetes mellitus with diabetic neuropathy, unspecified: Secondary | ICD-10-CM | POA: Diagnosis not present

## 2017-04-14 DIAGNOSIS — E784 Other hyperlipidemia: Secondary | ICD-10-CM | POA: Diagnosis not present

## 2017-04-14 DIAGNOSIS — M810 Age-related osteoporosis without current pathological fracture: Secondary | ICD-10-CM | POA: Diagnosis not present

## 2017-04-14 DIAGNOSIS — Z23 Encounter for immunization: Secondary | ICD-10-CM | POA: Diagnosis not present

## 2017-04-14 DIAGNOSIS — R632 Polyphagia: Secondary | ICD-10-CM | POA: Diagnosis not present

## 2017-04-15 ENCOUNTER — Other Ambulatory Visit (HOSPITAL_COMMUNITY): Payer: Self-pay | Admitting: Internal Medicine

## 2017-04-15 ENCOUNTER — Ambulatory Visit (HOSPITAL_COMMUNITY)
Admission: RE | Admit: 2017-04-15 | Discharge: 2017-04-15 | Disposition: A | Discharge status: Home / Self Care | Payer: Medicare Other | Source: Ambulatory Visit | Attending: Vascular Surgery | Admitting: Vascular Surgery

## 2017-04-15 DIAGNOSIS — M7989 Other specified soft tissue disorders: Secondary | ICD-10-CM

## 2017-04-15 DIAGNOSIS — M79662 Pain in left lower leg: Secondary | ICD-10-CM

## 2017-04-15 DIAGNOSIS — R600 Localized edema: Secondary | ICD-10-CM | POA: Diagnosis not present

## 2017-04-15 DIAGNOSIS — M79661 Pain in right lower leg: Secondary | ICD-10-CM

## 2017-04-16 NOTE — Progress Notes (Signed)
   Subjective:    Patient ID: [place] Mac, female    DOB: [birth year], 83 y.o.   MRN: [id number]  HPI Chief Complaint  Patient presents with  . Debridement    Trim toenails    83 y.o. female returns for the above complaint. Reports painfully thickened toenails that hurts when she wears her shoes. Denies other issues.   Review of Systems     Objective:   Physical Exampresent 1+ There were no vitals filed for this visit. General AA&O x3. Normal mood and affect.  Vascular Dorsalis pedis and posterior tibial pulses  present 1+ bilaterally  Capillary refill normal to all digits. Pedal hair growth diminished.  Neurologic Epicritic sensation present bilaterally.  Dermatologic No open lesions. Interspaces clear of maceration.  Normal skin temperature and turgor. Hyperkeratotic lesions: none bilaterally. Nails: brittle, onychomycosis, thickening, painful  Orthopedic: No history of amputation. MMT 5/5 in dorsiflexion, plantarflexion, inversion, and eversion. Normal lower extremity joint ROM without pain or crepitus.     Assessment & Plan:  Onychomycosis with Pain -Nails debrided as below. -Educated on self-care -F/u PRN  Procedure: Nail Debridement Rationale: pain Type of Debridement: manual, sharp debridement. Instrumentation: Nail nipper, rotary burr. Number of Nails: 10

## 2017-04-25 DIAGNOSIS — I87319 Chronic venous hypertension (idiopathic) with ulcer of unspecified lower extremity: Secondary | ICD-10-CM | POA: Diagnosis not present

## 2017-04-25 DIAGNOSIS — Z6825 Body mass index (BMI) 25.0-25.9, adult: Secondary | ICD-10-CM | POA: Diagnosis not present

## 2017-04-25 DIAGNOSIS — M79605 Pain in left leg: Secondary | ICD-10-CM | POA: Diagnosis not present

## 2017-04-25 DIAGNOSIS — I10 Essential (primary) hypertension: Secondary | ICD-10-CM | POA: Diagnosis not present

## 2017-05-02 DIAGNOSIS — Z4800 Encounter for change or removal of nonsurgical wound dressing: Secondary | ICD-10-CM | POA: Diagnosis not present

## 2017-05-02 DIAGNOSIS — M79605 Pain in left leg: Secondary | ICD-10-CM | POA: Diagnosis not present

## 2017-05-02 DIAGNOSIS — I87319 Chronic venous hypertension (idiopathic) with ulcer of unspecified lower extremity: Secondary | ICD-10-CM | POA: Diagnosis not present

## 2017-05-02 DIAGNOSIS — Z6825 Body mass index (BMI) 25.0-25.9, adult: Secondary | ICD-10-CM | POA: Diagnosis not present

## 2017-07-09 DIAGNOSIS — Z6824 Body mass index (BMI) 24.0-24.9, adult: Secondary | ICD-10-CM | POA: Diagnosis not present

## 2017-07-09 DIAGNOSIS — I10 Essential (primary) hypertension: Secondary | ICD-10-CM | POA: Diagnosis not present

## 2017-07-09 DIAGNOSIS — E7849 Other hyperlipidemia: Secondary | ICD-10-CM | POA: Diagnosis not present

## 2017-07-09 DIAGNOSIS — E119 Type 2 diabetes mellitus without complications: Secondary | ICD-10-CM | POA: Diagnosis not present

## 2017-07-09 DIAGNOSIS — M810 Age-related osteoporosis without current pathological fracture: Secondary | ICD-10-CM | POA: Diagnosis not present

## 2017-07-09 DIAGNOSIS — R600 Localized edema: Secondary | ICD-10-CM | POA: Diagnosis not present

## 2017-07-21 DIAGNOSIS — I509 Heart failure, unspecified: Secondary | ICD-10-CM | POA: Diagnosis not present

## 2017-09-10 DIAGNOSIS — Z961 Presence of intraocular lens: Secondary | ICD-10-CM | POA: Diagnosis not present

## 2017-09-10 DIAGNOSIS — H5201 Hypermetropia, right eye: Secondary | ICD-10-CM | POA: Diagnosis not present

## 2017-09-10 DIAGNOSIS — E113291 Type 2 diabetes mellitus with mild nonproliferative diabetic retinopathy without macular edema, right eye: Secondary | ICD-10-CM | POA: Diagnosis not present

## 2017-09-12 DIAGNOSIS — Z6824 Body mass index (BMI) 24.0-24.9, adult: Secondary | ICD-10-CM | POA: Diagnosis not present

## 2017-09-12 DIAGNOSIS — I878 Other specified disorders of veins: Secondary | ICD-10-CM | POA: Diagnosis not present

## 2017-09-12 DIAGNOSIS — M79605 Pain in left leg: Secondary | ICD-10-CM | POA: Diagnosis not present

## 2017-09-12 DIAGNOSIS — R600 Localized edema: Secondary | ICD-10-CM | POA: Diagnosis not present

## 2017-10-20 ENCOUNTER — Inpatient Hospital Stay (HOSPITAL_COMMUNITY)
Admission: EM | Admit: 2017-10-20 | Discharge: 2017-10-24 | DRG: 388 | Disposition: A | Discharge status: Home / Self Care | Payer: Medicare Other | Attending: Family Medicine | Admitting: Family Medicine

## 2017-10-20 ENCOUNTER — Emergency Department (HOSPITAL_COMMUNITY): Payer: Medicare Other

## 2017-10-20 ENCOUNTER — Encounter (HOSPITAL_COMMUNITY): Payer: Self-pay | Admitting: Emergency Medicine

## 2017-10-20 DIAGNOSIS — E119 Type 2 diabetes mellitus without complications: Secondary | ICD-10-CM

## 2017-10-20 DIAGNOSIS — N183 Chronic kidney disease, stage 3 unspecified: Secondary | ICD-10-CM | POA: Diagnosis present

## 2017-10-20 DIAGNOSIS — E1122 Type 2 diabetes mellitus with diabetic chronic kidney disease: Secondary | ICD-10-CM | POA: Diagnosis present

## 2017-10-20 DIAGNOSIS — Z7952 Long term (current) use of systemic steroids: Secondary | ICD-10-CM

## 2017-10-20 DIAGNOSIS — J181 Lobar pneumonia, unspecified organism: Secondary | ICD-10-CM | POA: Diagnosis not present

## 2017-10-20 DIAGNOSIS — Z7984 Long term (current) use of oral hypoglycemic drugs: Secondary | ICD-10-CM

## 2017-10-20 DIAGNOSIS — K56609 Unspecified intestinal obstruction, unspecified as to partial versus complete obstruction: Secondary | ICD-10-CM | POA: Diagnosis not present

## 2017-10-20 DIAGNOSIS — N179 Acute kidney failure, unspecified: Secondary | ICD-10-CM | POA: Diagnosis present

## 2017-10-20 DIAGNOSIS — K828 Other specified diseases of gallbladder: Secondary | ICD-10-CM | POA: Diagnosis present

## 2017-10-20 DIAGNOSIS — E785 Hyperlipidemia, unspecified: Secondary | ICD-10-CM | POA: Diagnosis present

## 2017-10-20 DIAGNOSIS — M549 Dorsalgia, unspecified: Secondary | ICD-10-CM | POA: Diagnosis present

## 2017-10-20 DIAGNOSIS — Z0189 Encounter for other specified special examinations: Secondary | ICD-10-CM

## 2017-10-20 DIAGNOSIS — Z9841 Cataract extraction status, right eye: Secondary | ICD-10-CM

## 2017-10-20 DIAGNOSIS — E871 Hypo-osmolality and hyponatremia: Secondary | ICD-10-CM | POA: Diagnosis not present

## 2017-10-20 DIAGNOSIS — Z888 Allergy status to other drugs, medicaments and biological substances status: Secondary | ICD-10-CM

## 2017-10-20 DIAGNOSIS — E7800 Pure hypercholesterolemia, unspecified: Secondary | ICD-10-CM | POA: Diagnosis present

## 2017-10-20 DIAGNOSIS — I2510 Atherosclerotic heart disease of native coronary artery without angina pectoris: Secondary | ICD-10-CM | POA: Diagnosis present

## 2017-10-20 DIAGNOSIS — Z9842 Cataract extraction status, left eye: Secondary | ICD-10-CM

## 2017-10-20 DIAGNOSIS — I447 Left bundle-branch block, unspecified: Secondary | ICD-10-CM | POA: Diagnosis present

## 2017-10-20 DIAGNOSIS — J9601 Acute respiratory failure with hypoxia: Secondary | ICD-10-CM | POA: Diagnosis present

## 2017-10-20 DIAGNOSIS — R1110 Vomiting, unspecified: Secondary | ICD-10-CM

## 2017-10-20 DIAGNOSIS — F0390 Unspecified dementia without behavioral disturbance: Secondary | ICD-10-CM | POA: Diagnosis present

## 2017-10-20 DIAGNOSIS — I10 Essential (primary) hypertension: Secondary | ICD-10-CM | POA: Diagnosis present

## 2017-10-20 DIAGNOSIS — Z79899 Other long term (current) drug therapy: Secondary | ICD-10-CM

## 2017-10-20 DIAGNOSIS — E860 Dehydration: Secondary | ICD-10-CM | POA: Diagnosis present

## 2017-10-20 DIAGNOSIS — R0600 Dyspnea, unspecified: Secondary | ICD-10-CM

## 2017-10-20 DIAGNOSIS — I252 Old myocardial infarction: Secondary | ICD-10-CM

## 2017-10-20 DIAGNOSIS — Z951 Presence of aortocoronary bypass graft: Secondary | ICD-10-CM

## 2017-10-20 DIAGNOSIS — R112 Nausea with vomiting, unspecified: Secondary | ICD-10-CM | POA: Diagnosis not present

## 2017-10-20 DIAGNOSIS — Z7983 Long term (current) use of bisphosphonates: Secondary | ICD-10-CM

## 2017-10-20 DIAGNOSIS — Z7982 Long term (current) use of aspirin: Secondary | ICD-10-CM

## 2017-10-20 DIAGNOSIS — Z90710 Acquired absence of both cervix and uterus: Secondary | ICD-10-CM

## 2017-10-20 DIAGNOSIS — M1990 Unspecified osteoarthritis, unspecified site: Secondary | ICD-10-CM | POA: Diagnosis present

## 2017-10-20 DIAGNOSIS — I129 Hypertensive chronic kidney disease with stage 1 through stage 4 chronic kidney disease, or unspecified chronic kidney disease: Secondary | ICD-10-CM | POA: Diagnosis present

## 2017-10-20 DIAGNOSIS — Z9049 Acquired absence of other specified parts of digestive tract: Secondary | ICD-10-CM

## 2017-10-20 DIAGNOSIS — K8020 Calculus of gallbladder without cholecystitis without obstruction: Secondary | ICD-10-CM | POA: Diagnosis not present

## 2017-10-20 DIAGNOSIS — R0989 Other specified symptoms and signs involving the circulatory and respiratory systems: Secondary | ICD-10-CM

## 2017-10-20 LAB — COMPREHENSIVE METABOLIC PANEL
ALBUMIN: 4.1 g/dL (ref 3.5–5.0)
ALT: 11 U/L — ABNORMAL LOW (ref 14–54)
AST: 18 U/L (ref 15–41)
Alkaline Phosphatase: 78 U/L (ref 38–126)
Anion gap: 13 (ref 5–15)
BUN: 27 mg/dL — AB (ref 6–20)
CHLORIDE: 102 mmol/L (ref 101–111)
CO2: 26 mmol/L (ref 22–32)
Calcium: 10.3 mg/dL (ref 8.9–10.3)
Creatinine, Ser: 1.43 mg/dL — ABNORMAL HIGH (ref 0.44–1.00)
GFR calc Af Amer: 36 mL/min — ABNORMAL LOW (ref >60)
GFR calc non Af Amer: 31 mL/min — ABNORMAL LOW (ref >60)
GLUCOSE: 233 mg/dL — AB (ref 65–99)
POTASSIUM: 4 mmol/L (ref 3.5–5.1)
Sodium: 141 mmol/L (ref 135–145)
Total Bilirubin: 1.2 mg/dL (ref 0.3–1.2)
Total Protein: 7.6 g/dL (ref 6.5–8.1)

## 2017-10-20 LAB — CBC
HCT: 35.2 % — ABNORMAL LOW (ref 36.0–46.0)
Hemoglobin: 11.1 g/dL — ABNORMAL LOW (ref 12.0–15.0)
MCH: 25.6 pg — ABNORMAL LOW (ref 26.0–34.0)
MCHC: 31.5 g/dL (ref 30.0–36.0)
MCV: 81.1 fL (ref 78.0–100.0)
Platelets: 173 K/uL (ref 150–400)
RBC: 4.34 MIL/uL (ref 3.87–5.11)
RDW: 17.2 % — ABNORMAL HIGH (ref 11.5–15.5)
WBC: 17.2 K/uL — ABNORMAL HIGH (ref 4.0–10.5)

## 2017-10-20 LAB — LIPASE, BLOOD: LIPASE: 26 U/L (ref 11–51)

## 2017-10-20 LAB — TROPONIN I: Troponin I: <0.03 ng/mL (ref <0.03)

## 2017-10-20 LAB — CBG MONITORING, ED: GLUCOSE-CAPILLARY: 209 mg/dL — AB (ref 65–99)

## 2017-10-20 MED ORDER — SODIUM CHLORIDE 0.9 % IV SOLN
Freq: Once | INTRAVENOUS | Status: AC
  Administered 2017-10-21: 02:00:00 via INTRAVENOUS

## 2017-10-20 MED ORDER — SODIUM CHLORIDE 0.9 % IV BOLUS (SEPSIS)
1000.0000 mL | Freq: Once | INTRAVENOUS | Status: AC
  Administered 2017-10-20: 1000 mL via INTRAVENOUS

## 2017-10-20 MED ORDER — LORAZEPAM 2 MG/ML IJ SOLN
0.5000 mg | Freq: Once | INTRAMUSCULAR | Status: AC
  Administered 2017-10-20: 0.5 mg via INTRAVENOUS
  Filled 2017-10-20: qty 1

## 2017-10-20 MED ORDER — ONDANSETRON HCL 4 MG/2ML IJ SOLN
4.0000 mg | Freq: Once | INTRAMUSCULAR | Status: AC
  Administered 2017-10-20: 4 mg via INTRAVENOUS
  Filled 2017-10-20: qty 2

## 2017-10-20 NOTE — ED Provider Notes (Signed)
[facility]-EMERGENCY DEPT Provider Note   CSN: [id number] Arrival date & time: 10/20/17  1744     History   Chief Complaint Chief Complaint  Patient presents with  . Emesis    HPI [place] Sonya Chavez is a 84 y.o. female.  84 year old female with prior history of CAD, HTN, MI, LBBB, SBO, and DM who presents with 24 hours of nausea and vomiting. Patient with minimal to no PO intake per husband. No reported abdominal pain or fever. No chest pain. No shortness of breath. No medications taken at home for her symptoms.    The history is provided by the patient.  Emesis   This is a new problem. The current episode started 12 to 24 hours ago. The problem occurs more than 10 times per day. The problem has not changed since onset.The emesis has an appearance of stomach contents. There has been no fever. Pertinent negatives include no abdominal pain.    Past Medical History:  Diagnosis Date  . Arthritis    "qwhere"  . Coronary artery disease   . Hypercholesteremia   . Hypertension   . Myocardial infarction (HCC) 2003  . SBO (small bowel obstruction) (HCC) 12/18/2016  . Stenosis of subclavian artery (HCC)   . Type II diabetes mellitus [facility])     Patient Active Problem List   Diagnosis Date Noted  . Delirium due to another medical condition 12/18/2016  . SBO (small bowel obstruction) (HCC) 12/18/2016  . 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 Leukocytosis 12/18/2016  . Peripheral artery disease (HCC) 11/08/2014  . PAD (peripheral artery disease) (HCC) 10/24/2014  . LBBB (left bundle branch block) 09/27/2014    Class: Chronic  . Subclavian artery stenosis, left (HCC) 09/27/2014  . Numbness and tingling in left hand 03/08/2013  . HTN (hypertension) 03/08/2013  . DM (diabetes mellitus) (HCC) 03/08/2013  . CAD (coronary artery disease) 03/08/2013    Past Surgical History:  Procedure Laterality Date  . ABDOMINAL HYSTERECTOMY  1970  . BOWEL RESECTION N/A  12/20/2016   Procedure: SMALL BOWEL RESECTION;  Surgeon: Abigail Miyamoto, MD;  Location: [facility] OR;  Service: General;  Laterality: N/A;  . CARDIAC CATHETERIZATION    . CATARACT EXTRACTION Bilateral 2000's  . CORONARY ARTERY BYPASS GRAFT  2003   in [place], [place]  . LAPAROTOMY N/A 12/20/2016   Procedure: EXPLORATORY LAPAROTOMY;  Surgeon: Abigail Miyamoto, MD;  Location[facility] OR;  Service: General;  Laterality: N/A;  . LEFT HEART CATHETERIZATION WITH CORONARY/GRAFT ANGIOGRAM N/A 09/27/2014   Procedure: LEFT HEART CATHETERIZATION WITH Isabel Caprice;  Surgeon: Pamella Pert, MD;  Location: [facility] CATH LAB;  Service: Cardiovascular;  Laterality: N/A;  . UNILATERAL UPPER EXTREMEITY ANGIOGRAM N/A 09/27/2014   Procedure: SUBCLAVIAN ARTERIOGRAM ;  Surgeon: Pamella Pert, MD;  Location: [facility] CATH LAB;  Service: Cardiovascular;  Laterality: N/A;  . UNILATERAL UPPER EXTREMEITY ANGIOGRAM N/A 11/09/2014   Procedure: Left subclavian stent ;  Surgeon: Yates Decamp, MD;  Location: [facility] CATH LAB;  Service: Cardiovascular;  Laterality: N/A;  . VISCERAL ANGIOGRAM N/A 10/25/2014   Procedure: Laurence Slate;  Surgeon: Yates Decamp, MD;  Location: [facility] CATH LAB;  Service: Cardiovascular;  Laterality: N/A;    OB History    No data available       Home Medications    Prior to Admission medications   Medication Sig Start Date End Date Taking? Authorizing Provider  acetaminophen (TYLENOL) 325 MG tablet Take 650 mg by mouth daily as needed (pain).  [provider]  alendronate (FOSAMAX) 70 MG tablet Take 70 mg by mouth every Monday.  12/08/14   [provider]  amLODipine (NORVASC) 10 MG tablet Take 10 mg by mouth at bedtime.  10/12/14   [provider]  aspirin EC 81 MG tablet Take 81 mg by mouth at bedtime.     [provider]  Calcium Citrate-Vitamin D (CALCIUM CITRATE + PO) Take 1 tablet by mouth daily.     [provider]  carvedilol (COREG) 6.25 MG tablet Take  12.5 mg by mouth 2 (two) times daily with a meal.     [provider]  chlorthalidone (HYGROTON) 25 MG tablet Take 1 tablet (25 mg total) by mouth daily. 09/29/14   Yates Decamp, MD  Cholecalciferol (VITAMIN D3) 2000 UNITS capsule Take 8,000 Units by mouth daily.     [provider]  gabapentin (NEURONTIN) 100 MG capsule Take 200 mg by mouth at bedtime.    [provider]  gabapentin (NEURONTIN) 300 MG capsule  04/09/17   [provider]  hydrALAZINE (APRESOLINE) 25 MG tablet  03/21/17   [provider]  losartan (COZAAR) 100 MG tablet Take 100 mg by mouth daily. 12/08/14   [provider]  metFORMIN (GLUCOPHAGE) 1000 MG tablet Take 1 tablet (1,000 mg total) by mouth 2 (two) times daily with a meal. 11/11/14   Yates Decamp, MD  mirtazapine (REMERON) 15 MG tablet  04/09/17   [provider]  potassium chloride (K-DUR) 10 MEQ tablet Take 2 tablets (20 mEq total) by mouth daily. 12/25/16 12/30/16  Elgergawy, Leana Roe, MD    Family History Family History  Problem Relation Age of Onset  . Gallbladder disease Neg Hx     Social History Social History   Tobacco Use  . Smoking status: Never Smoker  . Smokeless tobacco: Never Used  Substance Use Topics  . Alcohol use: No  . Drug use: No     Allergies   Crestor [rosuvastatin]; Lipitor [atorvastatin]; and Zocor [simvastatin]   Review of Systems Review of Systems  Gastrointestinal: Positive for nausea and vomiting. Negative for abdominal pain.  All other systems reviewed and are negative.    Physical Exam Updated Vital Signs BP (!) 170/119 (BP Location: Left Arm)   Pulse 74   Temp 99.1 F (37.3 C) (Oral)   Ht 5\' 3"  (1.6 m)   Wt 59 kg (130 lb)   SpO2 93%   BMI 23.03 kg/m   Physical Exam  Constitutional: She is oriented to person, place, and time. She appears well-developed and well-nourished. No distress.  HENT:  Head: Normocephalic and atraumatic.  Mouth/Throat: Oropharynx is  clear and moist.  Eyes: Conjunctivae and EOM are normal. Pupils are equal, round, and reactive to light.  Neck: Normal range of motion. Neck supple.  Cardiovascular: Normal rate, regular rhythm and normal heart sounds.  Pulmonary/Chest: Effort normal and breath sounds normal. No respiratory distress.  Abdominal: Soft. She exhibits no distension. There is no tenderness.  Musculoskeletal: Normal range of motion. She exhibits no edema or deformity.  Neurological: She is alert and oriented to person, place, and time.  Skin: Skin is warm and dry.  Psychiatric: She has a normal mood and affect.  Nursing note and vitals reviewed.    ED Treatments / Results  Labs (all labs ordered are listed, but only abnormal results are displayed) Labs Reviewed  COMPREHENSIVE METABOLIC PANEL - Abnormal; Notable for the following components:  Result Value   Glucose, Bld 233 (*)    BUN 27 (*)    Creatinine, Ser 1.43 (*)    ALT 11 (*)    GFR calc non Af Amer 31 (*)    GFR calc Af Amer 36 (*)    All other components within normal limits  CBC - Abnormal; Notable for the following components:   WBC 17.2 (*)    Hemoglobin 11.1 (*)    HCT 35.2 (*)    MCH 25.6 (*)    RDW 17.2 (*)    All other components within normal limits  CBG MONITORING, ED - Abnormal; Notable for the following components:   Glucose-Capillary 209 (*)    All other components within normal limits  LIPASE, BLOOD  TROPONIN I  URINALYSIS, ROUTINE W REFLEX MICROSCOPIC    EKG  EKG Interpretation  Date/Time:  Monday October 20 2017 18:53:43 EDT Ventricular Rate:  75 PR Interval:    QRS Duration: 161 QT Interval:  444 QTC Calculation: 496 R Axis:   -48 Text Interpretation:  Sinus rhythm Left bundle branch block Baseline wander in lead(s) II III aVR aVL aVF Confirmed by Kristine RoyalMessick, Corinda Ammon [phone]) on 10/20/2017 6:58:43 PM       Radiology No results found.  Procedures Procedures (including critical care time)  Medications Ordered  in ED Medications  sodium chloride 0.9 % bolus 1,000 mL (not administered)  ondansetron (ZOFRAN) injection 4 mg (not administered)     Initial Impression / Assessment and Plan / ED Course  I have reviewed the triage vital signs and the nursing notes.  Pertinent labs & imaging results that were available during my care of the patient were reviewed by me and considered in my medical decision making (see chart for details).     MDM   Screen complete  Patient with 24 hours of nausea and vomiting. Patient appears moderately dehydrated on exam. Nausea/vomiting poorly controlled with antiemetics in the ED. IVF administered. Patient with mildly increased BUN/Cr on screening labs and elevated WBC to 17.   CT suggests possible SBO. Patient with history of same (12/20/2017, SBO with closed loop obstruction, required exlap and SB resection).  Case discussed with general surgery - Dr. Ovidio Kinavid Newman - at 2345.  Surgery will evaluate the patient as a consulting service.  They recommend IV hydration and NG tube insertion at this time.  They request admission to the hospitalist service.  Hospitalist service - Dr. Julian ReilGardner - aware of case at 0015 and will evaluate the patient for admission.     Final Clinical Impressions(s) / ED Diagnoses   Final diagnoses:  Vomiting, intractability of vomiting not specified, presence of nausea not specified, unspecified vomiting type  Small bowel obstruction [facility](HCC)    ED Discharge Orders    None       Wynetta FinesMessick, Avaleen Brownley C, MD 10/21/17 [phone]

## 2017-10-20 NOTE — ED Triage Notes (Signed)
Patient presents ambulatory with husband. Husband states patient stayed in bed all day yesterday. In the middle of last night began not feeling well and had a few episodes of emesis. Pt denies any pain or N/D.

## 2017-10-20 NOTE — ED Notes (Signed)
Blood draw delayed.  Pt in process of moving to a different room.

## 2017-10-21 ENCOUNTER — Inpatient Hospital Stay (HOSPITAL_COMMUNITY): Payer: Medicare Other

## 2017-10-21 ENCOUNTER — Other Ambulatory Visit: Payer: Self-pay

## 2017-10-21 DIAGNOSIS — E860 Dehydration: Secondary | ICD-10-CM | POA: Diagnosis present

## 2017-10-21 DIAGNOSIS — K56609 Unspecified intestinal obstruction, unspecified as to partial versus complete obstruction: Secondary | ICD-10-CM | POA: Diagnosis not present

## 2017-10-21 DIAGNOSIS — K828 Other specified diseases of gallbladder: Secondary | ICD-10-CM

## 2017-10-21 DIAGNOSIS — I252 Old myocardial infarction: Secondary | ICD-10-CM | POA: Diagnosis not present

## 2017-10-21 DIAGNOSIS — I10 Essential (primary) hypertension: Secondary | ICD-10-CM

## 2017-10-21 DIAGNOSIS — E871 Hypo-osmolality and hyponatremia: Secondary | ICD-10-CM | POA: Diagnosis present

## 2017-10-21 DIAGNOSIS — Z90710 Acquired absence of both cervix and uterus: Secondary | ICD-10-CM | POA: Diagnosis not present

## 2017-10-21 DIAGNOSIS — F0390 Unspecified dementia without behavioral disturbance: Secondary | ICD-10-CM | POA: Diagnosis present

## 2017-10-21 DIAGNOSIS — R918 Other nonspecific abnormal finding of lung field: Secondary | ICD-10-CM | POA: Diagnosis not present

## 2017-10-21 DIAGNOSIS — E1122 Type 2 diabetes mellitus with diabetic chronic kidney disease: Secondary | ICD-10-CM | POA: Diagnosis present

## 2017-10-21 DIAGNOSIS — Z7952 Long term (current) use of systemic steroids: Secondary | ICD-10-CM | POA: Diagnosis not present

## 2017-10-21 DIAGNOSIS — Z9842 Cataract extraction status, left eye: Secondary | ICD-10-CM | POA: Diagnosis not present

## 2017-10-21 DIAGNOSIS — Z7982 Long term (current) use of aspirin: Secondary | ICD-10-CM | POA: Diagnosis not present

## 2017-10-21 DIAGNOSIS — E7800 Pure hypercholesterolemia, unspecified: Secondary | ICD-10-CM | POA: Diagnosis present

## 2017-10-21 DIAGNOSIS — I2510 Atherosclerotic heart disease of native coronary artery without angina pectoris: Secondary | ICD-10-CM | POA: Diagnosis present

## 2017-10-21 DIAGNOSIS — Z9049 Acquired absence of other specified parts of digestive tract: Secondary | ICD-10-CM | POA: Diagnosis not present

## 2017-10-21 DIAGNOSIS — M1990 Unspecified osteoarthritis, unspecified site: Secondary | ICD-10-CM | POA: Diagnosis present

## 2017-10-21 DIAGNOSIS — Z888 Allergy status to other drugs, medicaments and biological substances status: Secondary | ICD-10-CM | POA: Diagnosis not present

## 2017-10-21 DIAGNOSIS — K5669 Other partial intestinal obstruction: Secondary | ICD-10-CM | POA: Diagnosis not present

## 2017-10-21 DIAGNOSIS — R0602 Shortness of breath: Secondary | ICD-10-CM | POA: Diagnosis not present

## 2017-10-21 DIAGNOSIS — E785 Hyperlipidemia, unspecified: Secondary | ICD-10-CM | POA: Diagnosis present

## 2017-10-21 DIAGNOSIS — R140 Abdominal distension (gaseous): Secondary | ICD-10-CM | POA: Diagnosis not present

## 2017-10-21 DIAGNOSIS — N179 Acute kidney failure, unspecified: Secondary | ICD-10-CM

## 2017-10-21 DIAGNOSIS — I129 Hypertensive chronic kidney disease with stage 1 through stage 4 chronic kidney disease, or unspecified chronic kidney disease: Secondary | ICD-10-CM | POA: Diagnosis present

## 2017-10-21 DIAGNOSIS — Z9841 Cataract extraction status, right eye: Secondary | ICD-10-CM | POA: Diagnosis not present

## 2017-10-21 DIAGNOSIS — Z951 Presence of aortocoronary bypass graft: Secondary | ICD-10-CM | POA: Diagnosis not present

## 2017-10-21 DIAGNOSIS — N183 Chronic kidney disease, stage 3 unspecified: Secondary | ICD-10-CM | POA: Diagnosis present

## 2017-10-21 DIAGNOSIS — J9601 Acute respiratory failure with hypoxia: Secondary | ICD-10-CM | POA: Diagnosis present

## 2017-10-21 DIAGNOSIS — J181 Lobar pneumonia, unspecified organism: Secondary | ICD-10-CM | POA: Diagnosis not present

## 2017-10-21 DIAGNOSIS — Z4682 Encounter for fitting and adjustment of non-vascular catheter: Secondary | ICD-10-CM | POA: Diagnosis not present

## 2017-10-21 DIAGNOSIS — Z7983 Long term (current) use of bisphosphonates: Secondary | ICD-10-CM | POA: Diagnosis not present

## 2017-10-21 DIAGNOSIS — E119 Type 2 diabetes mellitus without complications: Secondary | ICD-10-CM | POA: Diagnosis not present

## 2017-10-21 LAB — BASIC METABOLIC PANEL
Anion gap: 13 (ref 5–15)
BUN: 32 mg/dL — AB (ref 6–20)
CHLORIDE: 103 mmol/L (ref 101–111)
CO2: 27 mmol/L (ref 22–32)
Calcium: 10 mg/dL (ref 8.9–10.3)
Creatinine, Ser: 1.48 mg/dL — ABNORMAL HIGH (ref 0.44–1.00)
GFR calc Af Amer: 35 mL/min — ABNORMAL LOW (ref >60)
GFR calc non Af Amer: 30 mL/min — ABNORMAL LOW (ref >60)
GLUCOSE: 176 mg/dL — AB (ref 65–99)
Potassium: 4.1 mmol/L (ref 3.5–5.1)
Sodium: 143 mmol/L (ref 135–145)

## 2017-10-21 LAB — LACTIC ACID, PLASMA
Lactic Acid, Venous: 1.2 mmol/L (ref 0.5–1.9)
Lactic Acid, Venous: 1.6 mmol/L (ref 0.5–1.9)

## 2017-10-21 LAB — CBC
HEMATOCRIT: 35.8 % — AB (ref 36.0–46.0)
HEMOGLOBIN: 11.4 g/dL — AB (ref 12.0–15.0)
MCH: 25.7 pg — AB (ref 26.0–34.0)
MCHC: 31.8 g/dL (ref 30.0–36.0)
MCV: 80.8 fL (ref 78.0–100.0)
Platelets: 156 10*3/uL (ref 150–400)
RBC: 4.43 MIL/uL (ref 3.87–5.11)
RDW: 17.3 % — AB (ref 11.5–15.5)
WBC: 14.2 10*3/uL — ABNORMAL HIGH (ref 4.0–10.5)

## 2017-10-21 LAB — GLUCOSE, CAPILLARY
GLUCOSE-CAPILLARY: 169 mg/dL — AB (ref 65–99)
GLUCOSE-CAPILLARY: 131 mg/dL — AB (ref 65–99)
GLUCOSE-CAPILLARY: 136 mg/dL — AB (ref 65–99)
Glucose-Capillary: 159 mg/dL — ABNORMAL HIGH (ref 65–99)
Glucose-Capillary: 64 mg/dL — ABNORMAL LOW (ref 65–99)
Glucose-Capillary: 228 mg/dL — ABNORMAL HIGH (ref 65–99)

## 2017-10-21 LAB — CBG MONITORING, ED: Glucose-Capillary: 228 mg/dL — ABNORMAL HIGH (ref 65–99)

## 2017-10-21 LAB — I-STAT CG4 LACTIC ACID, ED: Lactic Acid, Venous: 1.16 mmol/L (ref 0.5–1.9)

## 2017-10-21 MED ORDER — LIDOCAINE VISCOUS 2 % MT SOLN
15.0000 mL | Freq: Once | OROMUCOSAL | Status: AC
  Administered 2017-10-21: 15 mL via OROMUCOSAL
  Filled 2017-10-21: qty 15

## 2017-10-21 MED ORDER — ALBUTEROL SULFATE (2.5 MG/3ML) 0.083% IN NEBU: 2.5000 mg | INHALATION_SOLUTION | RESPIRATORY_TRACT | Status: DC | PRN

## 2017-10-21 MED ORDER — DIATRIZOATE MEGLUMINE & SODIUM 66-10 % PO SOLN
90.0000 mL | Freq: Once | ORAL | Status: AC
  Administered 2017-10-21: 90 mL via NASOGASTRIC
  Filled 2017-10-21: qty 90

## 2017-10-21 MED ORDER — ACETAMINOPHEN 650 MG RE SUPP: 650.0000 mg | Freq: Four times a day (QID) | RECTAL | Status: DC | PRN

## 2017-10-21 MED ORDER — ONDANSETRON HCL 4 MG/2ML IJ SOLN: 4.0000 mg | Freq: Four times a day (QID) | INTRAMUSCULAR | Status: DC | PRN

## 2017-10-21 MED ORDER — SODIUM CHLORIDE 0.9 % IV SOLN
INTRAVENOUS | Status: DC
  Administered 2017-10-21: 04:00:00 via INTRAVENOUS

## 2017-10-21 MED ORDER — DEXTROSE 50 % IV SOLN
INTRAVENOUS | Status: AC
  Administered 2017-10-21: 50 mL
  Filled 2017-10-21: qty 50

## 2017-10-21 MED ORDER — ONDANSETRON HCL 4 MG PO TABS: 4.0000 mg | ORAL_TABLET | Freq: Four times a day (QID) | ORAL | Status: DC | PRN

## 2017-10-21 MED ORDER — ASPIRIN EC 81 MG PO TBEC
81.0000 mg | DELAYED_RELEASE_TABLET | Freq: Every day | ORAL | Status: DC
  Administered 2017-10-22 – 2017-10-23 (×2): 81 mg via ORAL
  Filled 2017-10-21 (×2): qty 1

## 2017-10-21 MED ORDER — ACETAMINOPHEN 325 MG PO TABS
650.0000 mg | ORAL_TABLET | Freq: Four times a day (QID) | ORAL | Status: DC | PRN
  Administered 2017-10-23 – 2017-10-24 (×2): 650 mg via ORAL
  Filled 2017-10-21 (×2): qty 2

## 2017-10-21 MED ORDER — HYDRALAZINE HCL 25 MG PO TABS
25.0000 mg | ORAL_TABLET | Freq: Every day | ORAL | Status: DC
  Administered 2017-10-21 – 2017-10-24 (×3): 25 mg via ORAL
  Filled 2017-10-21 (×4): qty 1

## 2017-10-21 MED ORDER — AMLODIPINE BESYLATE 5 MG PO TABS
10.0000 mg | ORAL_TABLET | Freq: Every day | ORAL | Status: DC
  Administered 2017-10-22 – 2017-10-23 (×2): 10 mg via ORAL
  Filled 2017-10-21 (×2): qty 2

## 2017-10-21 MED ORDER — HYDRALAZINE HCL 20 MG/ML IJ SOLN: 5.0000 mg | Freq: Four times a day (QID) | INTRAMUSCULAR | Status: DC | PRN

## 2017-10-21 MED ORDER — GABAPENTIN 300 MG PO CAPS
300.0000 mg | ORAL_CAPSULE | Freq: Every day | ORAL | Status: DC
  Administered 2017-10-22 – 2017-10-23 (×2): 300 mg via ORAL
  Filled 2017-10-21 (×2): qty 1

## 2017-10-21 MED ORDER — ENOXAPARIN SODIUM 30 MG/0.3ML ~~LOC~~ SOLN
30.0000 mg | Freq: Every day | SUBCUTANEOUS | Status: DC
  Administered 2017-10-21 – 2017-10-24 (×4): 30 mg via SUBCUTANEOUS
  Filled 2017-10-21 (×4): qty 0.3

## 2017-10-21 MED ORDER — INSULIN ASPART 100 UNIT/ML ~~LOC~~ SOLN
0.0000 [IU] | SUBCUTANEOUS | Status: DC
  Administered 2017-10-21: 1 [IU] via SUBCUTANEOUS
  Administered 2017-10-21: 2 [IU] via SUBCUTANEOUS
  Administered 2017-10-21: 3 [IU] via SUBCUTANEOUS
  Administered 2017-10-21 (×2): 2 [IU] via SUBCUTANEOUS
  Administered 2017-10-22 (×3): 1 [IU] via SUBCUTANEOUS
  Administered 2017-10-23 (×2): 2 [IU] via SUBCUTANEOUS
  Administered 2017-10-23: 1 [IU] via SUBCUTANEOUS
  Administered 2017-10-23: 3 [IU] via SUBCUTANEOUS
  Administered 2017-10-24: 1 [IU] via SUBCUTANEOUS
  Filled 2017-10-21: qty 1

## 2017-10-21 MED ORDER — ENOXAPARIN SODIUM 30 MG/0.3ML ~~LOC~~ SOLN: 40.0000 mg | Freq: Every day | SUBCUTANEOUS | Status: DC

## 2017-10-21 NOTE — ED Notes (Signed)
ED TO INPATIENT HANDOFF REPORT  Name/Age/Gender [place] [name] 84 y.o. female  Code Status    Code Status Orders  (From admission, onward)        Start     Ordered   10/21/17 0033  Full code  Continuous     10/21/17 0034    Code Status History    Date Active Date Inactive Code Status Order ID Comments User Context   12/18/2016 02:59 12/25/2016 20:35 Full Code [id number]  Etta Quill, DO ED   11/09/2014 19:06 11/10/2014 15:36 Full Code [id number]  Adrian Prows, MD Inpatient   11/08/2014 16:17 11/09/2014 19:06 Full Code [id number]  Adrian Prows, MD Inpatient   10/25/2014 12:28 10/26/2014 13:05 Full Code [id number]  Adrian Prows, MD Inpatient   10/24/2014 12:07 10/25/2014 12:28 Full Code [id number]  Adrian Prows, MD Inpatient   09/27/2014 09:55 09/27/2014 17:56 Full Code [id number]  Adrian Prows, MD Inpatient   03/08/2013 18:25 03/09/2013 22:43 Full Code [id number]  Nobie Putnam, DO Inpatient      Home/SNF/Other Home  Chief Complaint Weakness; Fatigue; Emesis  Level of Care/Admitting Diagnosis ED Disposition    ED Disposition Condition Comment   Admit  Hospital Area: [place] [[id number]]  Level of Care: Med-Surg [16]  Diagnosis: SBO (small bowel obstruction) [facility]) [[id number]]  Admitting Physician: Doreatha Massed  Attending Physician: Etta Quill [phone]  Estimated length of stay: past midnight tomorrow  Certification:: I certify this patient will need inpatient services for at least 2 midnights  PT Class (Do Not Modify): Inpatient [101]  PT Acc Code (Do Not Modify): Private [1]       Medical History Past Medical History:  Diagnosis Date  . Arthritis    "qwhere"  . Coronary artery disease   . Hypercholesteremia   . Hypertension   . Myocardial infarction ([place]) 2003  . SBO (small bowel obstruction) ([place]) 12/18/2016  . Stenosis of subclavian artery ([place])   . Type II diabetes mellitus (HCC)     Allergies Allergies  Allergen Reactions  .  Crestor [Rosuvastatin] Other (See Comments)    Myalgias  . Lipitor [Atorvastatin] Other (See Comments)    myalgias  . Zocor [Simvastatin] Other (See Comments)    myalgias    IV Location/Drains/Wounds Patient Lines/Drains/Airways Status   Active Line/Drains/Airways    Name:   Placement date:   Placement time:   Site:   Days:   Peripheral IV 10/20/17 Left Antecubital   10/20/17    1931    Antecubital   1   Incision (Closed) 12/20/16 Abdomen Other (Comment)   12/20/16    1324     305          Labs/Imaging Results for orders placed or performed during the hospital encounter of 10/20/17 (from the past 48 hour(s))  CBG monitoring, ED     Status: Abnormal   Collection Time: 10/20/17  5:59 PM  Result Value Ref Range   Glucose-Capillary 209 (H) 65 - 99 mg/dL  Lipase, blood     Status: None   Collection Time: 10/20/17  7:37 PM  Result Value Ref Range   Lipase 26 11 - 51 U/L    Comment: Performed at [facility], [place] [address] [place], [place] [address]  Comprehensive metabolic panel     Status: Abnormal   Collection Time: 10/20/17  7:37 PM  Result Value Ref Range   Sodium 141 135 - 145 mmol/L   Potassium 4.0 3.5 -  5.1 mmol/L   Chloride 102 101 - 111 mmol/L   CO2 26 22 - 32 mmol/L   Glucose, Bld 233 (H) 65 - 99 mg/dL   BUN 27 (H) 6 - 20 mg/dL   Creatinine, Ser 1.43 (H) 0.44 - 1.00 mg/dL   Calcium 10.3 8.9 - 10.3 mg/dL   Total Protein 7.6 6.5 - 8.1 g/dL   Albumin 4.1 3.5 - 5.0 g/dL   AST 18 15 - 41 U/L   ALT 11 (L) 14 - 54 U/L   Alkaline Phosphatase 78 38 - 126 U/L   Total Bilirubin 1.2 0.3 - 1.2 mg/dL   GFR calc non Af Amer 31 (L) >60 mL/min   GFR calc Af Amer 36 (L) >60 mL/min    Comment: (NOTE) The eGFR has been calculated using the CKD EPI equation. This calculation has not been validated in all clinical situations. eGFR's persistently <60 mL/min signify possible Chronic Kidney Disease.    Anion gap 13 5 - 15    Comment: Performed at [facility], [place] [address], [place], [place] [address]  Troponin I     Status: None   Collection Time: 10/20/17  7:44 PM  Result Value Ref Range   Troponin I <0.03 <0.03 ng/mL    Comment: Performed at [facility], [place] [address] [place], [place] [address]  CBC     Status: Abnormal   Collection Time: 10/20/17  8:25 PM  Result Value Ref Range   WBC 17.2 (H) 4.0 - 10.5 K/uL   RBC 4.34 3.87 - 5.11 MIL/uL   Hemoglobin 11.1 (L) 12.0 - 15.0 g/dL   HCT 35.2 (L) 36.0 - 46.0 %   MCV 81.1 78.0 - 100.0 fL   MCH 25.6 (L) 26.0 - 34.0 pg   MCHC 31.5 30.0 - 36.0 g/dL   RDW 17.2 (H) 11.5 - 15.5 %   Platelets 173 150 - 400 K/uL    Comment: Performed at [facility], [place] [address], [place], [place] [address]   Ct Abdomen Pelvis Wo Contrast  Result Date: 10/20/2017 CLINICAL DATA:  Abdominal pain EXAM: CT ABDOMEN AND PELVIS WITHOUT CONTRAST TECHNIQUE: Multidetector CT imaging of the abdomen and pelvis was performed following the standard protocol without IV contrast. COMPARISON:  Ultrasound 12/21/2016, CT 12/18/2016 FINDINGS: Lower chest: Lung bases demonstrate partial consolidation in the right middle lobe with minimal nodularity. No significant pleural effusion. Post sternotomy changes. Borderline cardiomegaly. Hepatobiliary: Large stones in the gallbladder with wall thickening present. Gallbladder wall calcifications are also present. Punctate calcifications near the porta hepatis are unchanged. Pancreas: Unremarkable. No pancreatic ductal dilatation or surrounding inflammatory changes. Spleen: Normal in size without focal abnormality. Adrenals/Urinary Tract: Adrenal glands are within normal limits. No hydronephrosis. Previously noted hypodense lesion in the left kidney difficult to appreciate without contrast. Distended urinary bladder Stomach/Bowel: Marked enlargement of the stomach and proximal small bowel with dilated fluid-filled small bowel measuring up to  4.4 cm. Transition to decompressed small bowel in the left lower quadrant but poorly defined due to absence of oral contrast. No significant bowel wall thickening. Distal small bowel and colon are collapsed. Extensive sigmoid colon diverticula. Vascular/Lymphatic: Extensive aortic atherosclerosis. No aneurysmal dilatation. No significantly enlarged lymph nodes. Reproductive: Status post hysterectomy. No adnexal masses. Other: No significant free air or free fluid. Previously noted coarse ventral calcification above the pubic symphysis is no longer visualized. Musculoskeletal: Grade 1 anterolisthesis L4 on L5. Degenerative changes of the spine. IMPRESSION: 1. Dilated  fluid-filled stomach and proximal small bowel consistent with high-grade bowel obstruction, transition to decompressed mid to distal small bowel in the left lower quadrant, transition point poorly defined due to lack of enteral contrast. 2. Large stones in the gallbladder. Wall calcification consistent with porcelain gallbladder. Suspected gallbladder wall thickening as before. 3. Sigmoid colon diverticular disease without acute inflammation. Electronically Signed   By: Donavan Foil M.D.   On: 10/20/2017 23:23    Pending Labs Unresulted Labs (From admission, onward)   Start     Ordered   10/21/17 0500  CBC  Tomorrow morning,   R     10/21/17 0034   10/21/17 5953  Basic metabolic panel  Tomorrow morning,   R     10/21/17 0034   10/21/17 0014  Lactic acid, plasma  STAT Now then every 3 hours,   R     10/21/17 0013   10/20/17 1834  Urinalysis, Routine w reflex microscopic  STAT,   STAT     10/20/17 1833      Vitals/Pain Today's Vitals   10/20/17 1755 10/20/17 1952 10/20/17 1954 10/21/17 0000  BP: (!) 170/119  (!) 157/55 (!) 156/66  Pulse: 74 72 72 75  Resp:  '20 20 19  ' Temp: 99.1 F (37.3 C)     TempSrc: Oral     SpO2: 93% 97% 97% 92%  Weight: 130 lb (59 kg)     Height: '5\' 3"'  (1.6 m)       Isolation Precautions No active  isolations  Medications Medications  0.9 %  sodium chloride infusion (not administered)  aspirin EC tablet 81 mg (not administered)  gabapentin (NEURONTIN) capsule 300 mg (not administered)  insulin aspart (novoLOG) injection 0-9 Units (not administered)  acetaminophen (TYLENOL) tablet 650 mg (not administered)    Or  acetaminophen (TYLENOL) suppository 650 mg (not administered)  ondansetron (ZOFRAN) tablet 4 mg (not administered)    Or  ondansetron (ZOFRAN) injection 4 mg (not administered)  enoxaparin (LOVENOX) injection 40 mg (not administered)  amLODipine (NORVASC) tablet 10 mg (not administered)  hydrALAZINE (APRESOLINE) tablet 25 mg (not administered)  sodium chloride 0.9 % bolus 1,000 mL (0 mLs Intravenous Stopped 10/20/17 2328)  ondansetron (ZOFRAN) injection 4 mg (4 mg Intravenous Given 10/20/17 1952)  LORazepam (ATIVAN) injection 0.5 mg (0.5 mg Intravenous Given 10/20/17 2334)  0.9 %  sodium chloride infusion ( Intravenous New Bag/Given 10/21/17 0135)    Mobility walks with person assist

## 2017-10-21 NOTE — ED Notes (Signed)
This nurse attempted to place NG tube, Pt could not tolerate procedure and began vomiting. MD made aware and stated to not reattempt at this time.

## 2017-10-21 NOTE — Progress Notes (Signed)
Call made to Radiology to inform them that Gastrografin has been placed in NG tube. Spoke with DIRECTVJennifer. Lina SarBeth Green Quincy, RN

## 2017-10-21 NOTE — Progress Notes (Addendum)
Hypoglycemic Event  CBG: 64  Treatment: D50 one amp 50mls  Follow-up CBG: Time: 0753 CBG Result: 228  Possible Reasons for Event: NPO, NG tube, IV is NS  Comments/MD notified:Silva Merrilee JanskyZapata    Emberleigh Reily, Netty StarringBeth Ann

## 2017-10-21 NOTE — Progress Notes (Addendum)
Attending MD note  Patient was seen, examined,treatment plan was discussed with the PA-S.  I have personally reviewed the clinical findings, lab, imaging studies and management of this patient in detail. I agree with the documentation, as recorded by the PA-S  Patient admitted after midnight by my partner.  Chart reviewed, patient seen and examined. Patient was admitted with working diagnosis of SBO, surgery was consulted and suggested conservative management with NG tube and IV fluids.  SBO protocol ordered.  Patient today slight lethargic due to Ativan, patient was agitated last night, possible due to sundowning as patient has history of dementia.  Plan: Continue conservative management, IV fluids and NG tube Follow-up surgery recommendation.  Avoid Ativan, keep room bright and family at bedside if possible to avoid sundowning.  Antihypertensive medication on hold due to n.p.o., will monitor BMP and CBC in a.m. rest as below and H&P   Sonya Dodrill, MD        Progress Note   [place] M Chandran ZOX:[id number] DOB: November 25, 1925 DOA: 10/20/2017 PCP: Alysia Penna, MD   LOS: 0 days    Brief Narrative:   [place] Duffin is a 84 year old female with a medical history significant of type 2 diabetes mellitus, MI, early dementia, porcelain gallbladder, LBBB, and SBO with ischemic enteritis s/p exploratory laparotomy, SBR, and appendectomy 12/20/16. She presented to the ED with 24 hours of nausea and vomiting with minimal PO intake, afebrile. Emesis of 10+ episodes in a day with minimal to no BM. CT abdomen was suggestive of SBO, surgery was consulted and suggested IVF and NG tube placement which was completed.   She was admitted under the working diagnosis of SBO.  Assessment/Plan:   Principal Problem:   SBO (small bowel obstruction) (HCC) Active Problems:   HTN (hypertension)   DM (diabetes mellitus) (HCC)   Porcelain gallbladder   AKI (acute kidney injury) (HCC)  SBO -CT  abdomen showed evidence of obstruction -Surgery consulted; surgical procedure not currently recommended -NPO -Continue IVF -NG tube placed -Contrast given to complete KUB with contrast  HTN -Antihypertensive medications on hold due to NPO -Administer hydralazine if SBP >180 -Continue to monitor  Porcelain gallbladder -Surgery not recommended from last admission  AKI -Creatinine today of 1.48, remaining at a plateau, continue to monitor -Repeat BMP in AM  Tyoe 2 diabetes mellitus -Continue to monitor capillary glucose, manage with SSI   Family Communication/Anticipated D/C date and plan/Code Status   DVT prophylaxis: Lovenox Code Status: Full code Family Communication: Husband, sister, and nephew present Disposition Plan: Home once stabilized   Medical Consultants:       Procedures:    None   Antimicrobials:    None   Subjective:   Patient is delirious with a progressing history of dementia, today being more apparent to the family. Husband and sister state that she was ambulating independently. She denies any pain, headache, chest pain, or abdominal pain. Her husband states she had a very small bowel movement Monday morning, 2-3 small pellets.    Objective:   Vitals:   10/21/17 0225 10/21/17 0256 10/21/17 0453 10/21/17 1246  BP: 107/80 (!) 122/92 (!) 173/73 (!) 155/63  Pulse: 80 84 90 83  Resp: (!) 22 (!) 22 (!) 22 19  Temp:  99.1 F (37.3 C) 98.4 F (36.9 C) 99.2 F (37.3 C)  TempSrc:  Oral Oral Oral  SpO2: (!) 85% 90% (!) 87% 94%  Weight:      Height:        Intake/Output  Summary (Last 24 hours) at 10/21/2017 1417 Last data filed at 10/21/2017 1253 Gross per 24 hour  Intake 1125 ml  Output 1975 ml  Net -850 ml   Filed Weights   11-09-2017 1755  Weight: 59 kg (130 lb)     Physical Exam:   Constitutional: NAD, sedated. Cardiovascular: RRR, systolic murmur, no rubs / gallops. S1-S2. No LE edema.  Respiratory: Slight crackles and  diffuse expiratory wheeze. Increased WOB.  Abdomen: No TTP. Bowel sounds positive. Mildly distended, soft abdomen. Musculoskeletal: No clubbing / cyanosis. No joint deformity upper and lower extremities.  Psychiatric: Oriented to place, but not to time or president.    Data Reviewed:   I have independently reviewed following labs and imaging studies:   CBC: Recent Labs  Lab 11-09-17 2025 10/21/17 0354  WBC 17.2* 14.2*  HGB 11.1* 11.4*  HCT 35.2* 35.8*  MCV 81.1 80.8  PLT 173 156   Basic Metabolic Panel: Recent Labs  Lab 09-Nov-2017 1937 10/21/17 0354  NA 141 143  K 4.0 4.1  CL 102 103  CO2 26 27  GLUCOSE 233* 176*  BUN 27* 32*  CREATININE 1.43* 1.48*  CALCIUM 10.3 10.0   GFR: Estimated Creatinine Clearance: 20.9 mL/min (A) (by C-G formula based on SCr of 1.48 mg/dL (H)). Liver Function Tests: Recent Labs  Lab 2017-11-09 1937  AST 18  ALT 11*  ALKPHOS 78  BILITOT 1.2  PROT 7.6  ALBUMIN 4.1   Recent Labs  Lab 11/09/2017 1937  LIPASE 26   No results for input(s): AMMONIA in the last 168 hours. Coagulation Profile: No results for input(s): INR, PROTIME in the last 168 hours. Cardiac Enzymes: Recent Labs  Lab November 09, 2017 1944  TROPONINI <0.03   BNP (last 3 results) No results for input(s): PROBNP in the last 8760 hours. HbA1C: No results for input(s): HGBA1C in the last 72 hours. CBG: Recent Labs  Lab 2017-11-09 1759 10/21/17 0206 10/21/17 0401 10/21/17 0725 10/21/17 0753  GLUCAP 209* 228* 159* 64* 228*   Lipid Profile: No results for input(s): CHOL, HDL, LDLCALC, TRIG, CHOLHDL, LDLDIRECT in the last 72 hours. Thyroid Function Tests: No results for input(s): TSH, T4TOTAL, FREET4, T3FREE, THYROIDAB in the last 72 hours. Anemia Panel: No results for input(s): VITAMINB12, FOLATE, FERRITIN, TIBC, IRON, RETICCTPCT in the last 72 hours. Urine analysis:    Component Value Date/Time   COLORURINE YELLOW 12/17/2016 2320   APPEARANCEUR HAZY (A) 12/17/2016  2320   LABSPEC 1.016 12/17/2016 2320   PHURINE 5.0 12/17/2016 2320   GLUCOSEU NEGATIVE 12/17/2016 2320   HGBUR SMALL (A) 12/17/2016 2320   BILIRUBINUR NEGATIVE 12/17/2016 2320   KETONESUR NEGATIVE 12/17/2016 2320   PROTEINUR 100 (A) 12/17/2016 2320   UROBILINOGEN 0.2 06/03/2013 1938   NITRITE NEGATIVE 12/17/2016 2320   LEUKOCYTESUR NEGATIVE 12/17/2016 2320   Sepsis Labs: Invalid input(s): PROCALCITONIN, LACTICIDVEN  No results found for this or any previous visit (from the past 240 hour(s)).    Radiology Studies: Ct Abdomen Pelvis Wo Contrast  Result Date: 2017/11/09 CLINICAL DATA:  Abdominal pain EXAM: CT ABDOMEN AND PELVIS WITHOUT CONTRAST TECHNIQUE: Multidetector CT imaging of the abdomen and pelvis was performed following the standard protocol without IV contrast. COMPARISON:  Ultrasound 12/21/2016, CT 12/18/2016 FINDINGS: Lower chest: Lung bases demonstrate partial consolidation in the right middle lobe with minimal nodularity. No significant pleural effusion. Post sternotomy changes. Borderline cardiomegaly. Hepatobiliary: Large stones in the gallbladder with wall thickening present. Gallbladder wall calcifications are also present. Punctate calcifications near the  porta hepatis are unchanged. Pancreas: Unremarkable. No pancreatic ductal dilatation or surrounding inflammatory changes. Spleen: Normal in size without focal abnormality. Adrenals/Urinary Tract: Adrenal glands are within normal limits. No hydronephrosis. Previously noted hypodense lesion in the left kidney difficult to appreciate without contrast. Distended urinary bladder Stomach/Bowel: Marked enlargement of the stomach and proximal small bowel with dilated fluid-filled small bowel measuring up to 4.4 cm. Transition to decompressed small bowel in the left lower quadrant but poorly defined due to absence of oral contrast. No significant bowel wall thickening. Distal small bowel and colon are collapsed. Extensive sigmoid colon  diverticula. Vascular/Lymphatic: Extensive aortic atherosclerosis. No aneurysmal dilatation. No significantly enlarged lymph nodes. Reproductive: Status post hysterectomy. No adnexal masses. Other: No significant free air or free fluid. Previously noted coarse ventral calcification above the pubic symphysis is no longer visualized. Musculoskeletal: Grade 1 anterolisthesis L4 on L5. Degenerative changes of the spine. IMPRESSION: 1. Dilated fluid-filled stomach and proximal small bowel consistent with high-grade bowel obstruction, transition to decompressed mid to distal small bowel in the left lower quadrant, transition point poorly defined due to lack of enteral contrast. 2. Large stones in the gallbladder. Wall calcification consistent with porcelain gallbladder. Suspected gallbladder wall thickening as before. 3. Sigmoid colon diverticular disease without acute inflammation. Electronically Signed   By: Jasmine PangKim  Fujinaga M.D.   On: 10/20/2017 23:23   Dg Chest Port 1 View  Result Date: 10/21/2017 CLINICAL DATA:  Increased breath sounds EXAM: PORTABLE CHEST 1 VIEW COMPARISON:  12/21/2016 FINDINGS: Cardiac shadow is mildly enlarged. Postsurgical changes are again noted. Nasogastric catheter is seen within the stomach. Changes of prior arterial stenting likely in the left subclavian artery are seen. Some patchy changes are noted in the left lung base and left mid lung increased from the prior exam. No bony abnormality is seen. IMPRESSION: Patchy infiltrates in the left mid and lower lung. Electronically Signed   By: Alcide CleverMark  Lukens M.D.   On: 10/21/2017 09:59   Dg Abd Portable 1v  Result Date: 10/21/2017 CLINICAL DATA:  Nasogastric tube placement EXAM: PORTABLE ABDOMEN - 1 VIEW COMPARISON:  CT abdomen and pelvis October 20, 2017 FINDINGS: Nasogastric tube tip and side port are in the stomach. There are loops of dilated small bowel suggesting a degree of bowel obstruction. No free air. There is atelectatic change in the  left lung base. IMPRESSION: Nasogastric tube tip and side port in stomach. Visualized bowel gas shows evidence of a degree of bowel obstruction. No free air evident. Electronically Signed   By: Bretta BangWilliam  Woodruff III M.D.   On: 10/21/2017 09:23      Medication:   . amLODipine  10 mg Oral QHS  . aspirin EC  81 mg Oral QHS  . enoxaparin (LOVENOX) injection  30 mg Subcutaneous Daily  . gabapentin  300 mg Oral QHS  . hydrALAZINE  25 mg Oral Daily  . insulin aspart  0-9 Units Subcutaneous Q4H    Continuous Infusions: . sodium chloride 125 mL/hr at 10/21/17 0343   Signed, Garner Nasheeyon Kamtarin, PA-S [place] PA Class of [phone]

## 2017-10-21 NOTE — Progress Notes (Signed)
Patient began vomiting and pulled out NGT as they were trying to place it.  Given that she is already de-satting to 85 on room air after ativan (and requiring NGT), would not recommend further sedation at this point in time unless we are prepared to intubate patient.

## 2017-10-21 NOTE — Progress Notes (Signed)
Rx Brief note: Lovenox Rx adjusted Lovenox to 30 mg daily in pt with CrCl<30 ml/min  Thanks Lorenza EvangelistGreen, Kaytie Ratcliffe R 10/21/2017 2:50 AM

## 2017-10-21 NOTE — Consult Note (Signed)
[facility] Surgery      Consult Note [place] M Kwiecinski Apr 29, 1926  [id number].    Requesting MD: Alcario Drought, DO Chief Complaint/Reason for Consult: SBO  HPI:  84 y/o AA female with MMP including MI, DM2, hyperlipidemia, porcelain gallbladder and SBO w/ ischemic enteritis s/p exploratory laparotomy, SBR, and appendectomy 5.18.2018 by Dr. Coralie Keens. She presented to the ED last night and was diagnosed with recurrent SBO, general surgery has been asked to consult.   Today the patients history is reported by her husband and sister at bedside, as the patient is somnolent. Her husband states that the patient exhibited sxs similar to previous bowel obstruction yesterday including "feeling sick", vomiting, and dehydration. He states she has a history of MI. She manages her own medications at home. Husband states she is forgetful and intermittently confused at baseline but is always alert and oriented. Ambulates independently. Husband states that he does not think she is on blood thinners other than baby ASA.   ROS: Review of Systems  Gastrointestinal: Negative for abdominal pain.  All other systems reviewed and are negative.    Family History  Problem Relation Age of Onset  . Gallbladder disease Neg Hx     Past Medical History:  Diagnosis Date  . Arthritis    "qwhere"  . Coronary artery disease   . Hypercholesteremia   . Hypertension   . Myocardial infarction ([place]) 2003  . SBO (small bowel obstruction) ([place]) 12/18/2016  . Stenosis of subclavian artery ([place])   . Type II diabetes mellitus ([place])     Past Surgical History:  Procedure Laterality Date  . ABDOMINAL HYSTERECTOMY  1970  . BOWEL RESECTION N/A 12/20/2016   Procedure: SMALL BOWEL RESECTION;  Surgeon: Coralie Keens, MD;  Location: [place];  Service: General;  Laterality: N/A;  . CARDIAC CATHETERIZATION    . CATARACT EXTRACTION Bilateral 2000's  . CORONARY ARTERY BYPASS GRAFT  2003   in [place], [place]  .  LAPAROTOMY N/A 12/20/2016   Procedure: EXPLORATORY LAPAROTOMY;  Surgeon: Coralie Keens, MD;  Location: [place];  Service: General;  Laterality: N/A;  . LEFT HEART CATHETERIZATION WITH CORONARY/GRAFT ANGIOGRAM N/A 09/27/2014   Procedure: LEFT HEART CATHETERIZATION WITH Beatrix Fetters;  Surgeon: Laverda Page, MD;  Location: [facility] CATH LAB;  Service: Cardiovascular;  Laterality: N/A;  . UNILATERAL UPPER EXTREMEITY ANGIOGRAM N/A 09/27/2014   Procedure: SUBCLAVIAN ARTERIOGRAM ;  Surgeon: Laverda Page, MD;  Location: [facility] CATH LAB;  Service: Cardiovascular;  Laterality: N/A;  . UNILATERAL UPPER EXTREMEITY ANGIOGRAM N/A 11/09/2014   Procedure: Left subclavian stent ;  Surgeon: Adrian Prows, MD;  Location: [facility] CATH LAB;  Service: Cardiovascular;  Laterality: N/A;  . VISCERAL ANGIOGRAM N/A 10/25/2014   Procedure: Blanchard Kelch;  Surgeon: Adrian Prows, MD;  Location: [facility] CATH LAB;  Service: Cardiovascular;  Laterality: N/A;    Social History:  reports that  has never smoked. she has never used smokeless tobacco. She reports that she does not drink alcohol or use drugs.  Allergies:  Allergies  Allergen Reactions  . Crestor [Rosuvastatin] Other (See Comments)    Myalgias  . Lipitor [Atorvastatin] Other (See Comments)    myalgias  . Zocor [Simvastatin] Other (See Comments)    myalgias    Medications Prior to Admission  Medication Sig Dispense Refill  . amLODipine (NORVASC) 10 MG tablet Take 10 mg by mouth at bedtime.     Marland Kitchen aspirin EC 81 MG tablet Take 81 mg by mouth at bedtime.     Marland Kitchen  Calcium Citrate-Vitamin D (CALCIUM CITRATE + PO) Take 1 tablet by mouth daily.     Marland Kitchen gabapentin (NEURONTIN) 300 MG capsule Take 300 mg by mouth at bedtime.     . hydrALAZINE (APRESOLINE) 25 MG tablet Take 25 mg by mouth daily.     Marland Kitchen alendronate (FOSAMAX) 70 MG tablet Take 70 mg by mouth every Monday.     . potassium chloride (K-DUR) 10 MEQ tablet Take 2 tablets (20 mEq total) by mouth daily. 10 tablet 0  .  triamcinolone cream (KENALOG) 0.1 % APPLY TO AFFECTED AREA DAILY AFTER SHOWER  0    Blood pressure (!) 173/73, pulse 90, temperature 98.4 F (36.9 C), temperature source Oral, resp. rate (!) 22, height '5\' 3"'  (1.6 m), weight 59 kg (130 lb), SpO2 (!) 87 %. Physical Exam: Physical Exam  Constitutional: She appears well-developed. No distress.  Elderly appearing female, non-toxic appearing  HENT:  Head: Normocephalic and atraumatic.  Right Ear: External ear normal.  Left Ear: External ear normal.  Nose: Nose normal.  Eyes: EOM are normal. Right eye exhibits no discharge. Left eye exhibits no discharge. No scleral icterus.  Neck: Normal range of motion. Neck supple. No tracheal deviation present.  Cardiovascular: Normal rate and regular rhythm. Exam reveals no gallop and no friction rub.  Pulmonary/Chest: Effort normal. No stridor. No respiratory distress. She has no wheezes. She has rales (crackles right anterior lung field).  Abdominal: Soft. She exhibits distension (mild disention). She exhibits no mass. There is no tenderness. There is no rebound and no guarding.  Hypoactive BS  Musculoskeletal: She exhibits no edema or tenderness.  Muscle wasting of all extremities  Lymphadenopathy:    She has no cervical adenopathy.  Neurological:  Somnolent, arouses to loud voice and light sternal rub. Answering questions with "mmhm" but not consistently following commands or verbalizing.   Skin: Skin is warm and dry. No rash noted. She is not diaphoretic. No erythema.    Results for orders placed or performed during the hospital encounter of 10/20/17 (from the past 48 hour(s))  CBG monitoring, ED     Status: Abnormal   Collection Time: 10/20/17  5:59 PM  Result Value Ref Range   Glucose-Capillary 209 (H) 65 - 99 mg/dL  Lipase, blood     Status: None   Collection Time: 10/20/17  7:37 PM  Result Value Ref Range   Lipase 26 11 - 51 U/L    Comment: Performed at [facility],  [place] [address] [place], [place] [address]  Comprehensive metabolic panel     Status: Abnormal   Collection Time: 10/20/17  7:37 PM  Result Value Ref Range   Sodium 141 135 - 145 mmol/L   Potassium 4.0 3.5 - 5.1 mmol/L   Chloride 102 101 - 111 mmol/L   CO2 26 22 - 32 mmol/L   Glucose, Bld 233 (H) 65 - 99 mg/dL   BUN 27 (H) 6 - 20 mg/dL   Creatinine, Ser 1.43 (H) 0.44 - 1.00 mg/dL   Calcium 10.3 8.9 - 10.3 mg/dL   Total Protein 7.6 6.5 - 8.1 g/dL   Albumin 4.1 3.5 - 5.0 g/dL   AST 18 15 - 41 U/L   ALT 11 (L) 14 - 54 U/L   Alkaline Phosphatase 78 38 - 126 U/L   Total Bilirubin 1.2 0.3 - 1.2 mg/dL   GFR calc non Af Amer 31 (L) >60 mL/min   GFR calc Af Amer 36 (L) >60 mL/min  Comment: (NOTE) The eGFR has been calculated using the CKD EPI equation. This calculation has not been validated in all clinical situations. eGFR's persistently <60 mL/min signify possible Chronic Kidney Disease.    Anion gap 13 5 - 15    Comment: Performed at [facility], [place] [address] [place], [place] [address]  Troponin I     Status: None   Collection Time: 10/20/17  7:44 PM  Result Value Ref Range   Troponin I <0.03 <0.03 ng/mL    Comment: Performed at [facility], [place] [address] [place], [place] [address]  CBC     Status: Abnormal   Collection Time: 10/20/17  8:25 PM  Result Value Ref Range   WBC 17.2 (H) 4.0 - 10.5 K/uL   RBC 4.34 3.87 - 5.11 MIL/uL   Hemoglobin 11.1 (L) 12.0 - 15.0 g/dL   HCT 35.2 (L) 36.0 - 46.0 %   MCV 81.1 78.0 - 100.0 fL   MCH 25.6 (L) 26.0 - 34.0 pg   MCHC 31.5 30.0 - 36.0 g/dL   RDW 17.2 (H) 11.5 - 15.5 %   Platelets 173 150 - 400 K/uL    Comment: Performed at [facility], [place] [address] [place], [place] [address]  Lactic acid, plasma     Status: None   Collection Time: 10/21/17  1:28 AM  Result Value Ref Range   Lactic Acid, Venous 1.2 0.5 - 1.9 mmol/L    Comment: Performed at [facility], [place] [address] [place], [place] [address]  I-Stat CG4 Lactic Acid, ED     Status: None   Collection Time: 10/21/17  1:35 AM  Result Value Ref Range   Lactic Acid, Venous 1.16 0.5 - 1.9 mmol/L  CBG monitoring, ED     Status: Abnormal   Collection Time: 10/21/17  2:06 AM  Result Value Ref Range   Glucose-Capillary 228 (H) 65 - 99 mg/dL  Lactic acid, plasma     Status: None   Collection Time: 10/21/17  3:54 AM  Result Value Ref Range   Lactic Acid, Venous 1.6 0.5 - 1.9 mmol/L    Comment: Performed at [facility], [place] [address] [place], [place] [address]  CBC     Status: Abnormal   Collection Time: 10/21/17  3:54 AM  Result Value Ref Range   WBC 14.2 (H) 4.0 - 10.5 K/uL   RBC 4.43 3.87 - 5.11 MIL/uL   Hemoglobin 11.4 (L) 12.0 - 15.0 g/dL   HCT 35.8 (L) 36.0 - 46.0 %   MCV 80.8 78.0 - 100.0 fL   MCH 25.7 (L) 26.0 - 34.0 pg   MCHC 31.8 30.0 - 36.0 g/dL   RDW 17.3 (H) 11.5 - 15.5 %   Platelets 156 150 - 400 K/uL    Comment: Performed at [facility], [place] [address], [place], [place] [address]  Basic metabolic panel     Status: Abnormal   Collection Time: 10/21/17  3:54 AM  Result Value Ref Range   Sodium 143 135 - 145 mmol/L   Potassium 4.1 3.5 - 5.1 mmol/L   Chloride 103 101 - 111 mmol/L   CO2 27 22 - 32 mmol/L   Glucose, Bld 176 (H) 65 - 99 mg/dL   BUN 32 (H) 6 - 20 mg/dL   Creatinine, Ser 1.48 (H) 0.44 - 1.00 mg/dL   Calcium 10.0 8.9 - 10.3 mg/dL   GFR calc non Af Wyvonnia Lora  30 (L) >60 mL/min   GFR calc Af Amer 35 (L) >60 mL/min    Comment: (NOTE) The eGFR has been calculated using the CKD EPI equation. This calculation has not been validated in all clinical situations. eGFR's persistently <60 mL/min signify possible Chronic Kidney Disease.    Anion gap 13 5 - 15    Comment: Performed at [facility], [place] [address] [place], [place] [address]  Glucose, capillary     Status: Abnormal   Collection Time:  10/21/17  4:01 AM  Result Value Ref Range   Glucose-Capillary 159 (H) 65 - 99 mg/dL  Glucose, capillary     Status: Abnormal   Collection Time: 10/21/17  7:25 AM  Result Value Ref Range   Glucose-Capillary 64 (L) 65 - 99 mg/dL  Glucose, capillary     Status: Abnormal   Collection Time: 10/21/17  7:53 AM  Result Value Ref Range   Glucose-Capillary 228 (H) 65 - 99 mg/dL   Ct Abdomen Pelvis Wo Contrast  Result Date: 10/20/2017 CLINICAL DATA:  Abdominal pain EXAM: CT ABDOMEN AND PELVIS WITHOUT CONTRAST TECHNIQUE: Multidetector CT imaging of the abdomen and pelvis was performed following the standard protocol without IV contrast. COMPARISON:  Ultrasound 12/21/2016, CT 12/18/2016 FINDINGS: Lower chest: Lung bases demonstrate partial consolidation in the right middle lobe with minimal nodularity. No significant pleural effusion. Post sternotomy changes. Borderline cardiomegaly. Hepatobiliary: Large stones in the gallbladder with wall thickening present. Gallbladder wall calcifications are also present. Punctate calcifications near the porta hepatis are unchanged. Pancreas: Unremarkable. No pancreatic ductal dilatation or surrounding inflammatory changes. Spleen: Normal in size without focal abnormality. Adrenals/Urinary Tract: Adrenal glands are within normal limits. No hydronephrosis. Previously noted hypodense lesion in the left kidney difficult to appreciate without contrast. Distended urinary bladder Stomach/Bowel: Marked enlargement of the stomach and proximal small bowel with dilated fluid-filled small bowel measuring up to 4.4 cm. Transition to decompressed small bowel in the left lower quadrant but poorly defined due to absence of oral contrast. No significant bowel wall thickening. Distal small bowel and colon are collapsed. Extensive sigmoid colon diverticula. Vascular/Lymphatic: Extensive aortic atherosclerosis. No aneurysmal dilatation. No significantly enlarged lymph nodes. Reproductive: Status  post hysterectomy. No adnexal masses. Other: No significant free air or free fluid. Previously noted coarse ventral calcification above the pubic symphysis is no longer visualized. Musculoskeletal: Grade 1 anterolisthesis L4 on L5. Degenerative changes of the spine. IMPRESSION: 1. Dilated fluid-filled stomach and proximal small bowel consistent with high-grade bowel obstruction, transition to decompressed mid to distal small bowel in the left lower quadrant, transition point poorly defined due to lack of enteral contrast. 2. Large stones in the gallbladder. Wall calcification consistent with porcelain gallbladder. Suspected gallbladder wall thickening as before. 3. Sigmoid colon diverticular disease without acute inflammation. Electronically Signed   By: Donavan Foil M.D.   On: 10/20/2017 23:23   Assessment/Plan SBO, recurrent - PMH ex lap, SBR, appendectomy 12/2016 by DB - NPO, IVF - NG tube, small bowel protocol - given age/comorbidies, do not recommend surgery. Emergent surgery not indicated at this time. We will follow. Will discuss patient/family wishes regarding surgery.   Respiratory crackles on exam - will check CXR  HTN DM AKI porcelain gallbladder   Jill Alexanders, [facility] Surgery 10/21/2017, 9:11 AM Pager: [phone] Consults: [phone] Mon-Fri 7:00 am-4:30 pm Sat-Sun 7:00 am-11:30 am

## 2017-10-21 NOTE — H&P (Signed)
History and Physical    [place] M Preast ZOX:[id number] DOB: 08-Feb-1926 DOA: 10/20/2017  PCP: Alysia Penna, MD  Patient coming from: Home  I have personally briefly reviewed patient's old medical records in [facility] Health Link  Chief Complaint: Emesis  HPI: Sonya Chavez is a 84 y.o. female with medical history significant of DM2, MI, suspect baseline MCI / early dementia, porcelain gallbladder, admission for SBO in May of last year by myself.  Developed into ischemic enteritis -> ex-lap -> small bowel resection and appendectomy.  Resolved and she apparently has done fairly well for the past 10 months since that time, until she developed nausea vomiting for the past 24 hours.  Presents to ED with such.   ED Course: High grade SBO on CT scan.  Also has mild AKI.  WBC 17k.  Developed some delirium as well while in ED (of note had delirium at presentation last admission).  Dr. Ezzard Standing consulted, surg will see in AM, wants NGT placed and medicine admission.   Review of Systems: As per HPI otherwise 10 point review of systems negative.   Past Medical History:  Diagnosis Date  . Arthritis    "qwhere"  . Coronary artery disease   . Hypercholesteremia   . Hypertension   . Myocardial infarction ([facility]) 2003  . SBO (small bowel obstruction) ([facility]) 12/18/2016  . Stenosis of subclavian artery ([facility])   . Type II diabetes mellitus ([facility])     Past Surgical History:  Procedure Laterality Date  . ABDOMINAL HYSTERECTOMY  1970  . BOWEL RESECTION N/A 12/20/2016   Procedure: SMALL BOWEL RESECTION;  Surgeon: Abigail Miyamoto, MD;  Location: [facility] OR;  Service: General;  Laterality: N/A;  . CARDIAC CATHETERIZATION    . CATARACT EXTRACTION Bilateral 2000's  . CORONARY ARTERY BYPASS GRAFT  2003   in [place], [place]  . LAPAROTOMY N/A 12/20/2016   Procedure: EXPLORATORY LAPAROTOMY;  Surgeon: Abigail Miyamoto, MD;  Location: [facility] OR;  Service: General;  Laterality: N/A;  . LEFT HEART  CATHETERIZATION WITH CORONARY/GRAFT ANGIOGRAM N/A 09/27/2014   Procedure: LEFT HEART CATHETERIZATION WITH Isabel Caprice;  Surgeon: Pamella Pert, MD;  Location: [facility] CATH LAB;  Service: Cardiovascular;  Laterality: N/A;  . UNILATERAL UPPER EXTREMEITY ANGIOGRAM N/A 09/27/2014   Procedure: SUBCLAVIAN ARTERIOGRAM ;  Surgeon: Pamella Pert, MD;  Location: [facility] CATH LAB;  Service: Cardiovascular;  Laterality: N/A;  . UNILATERAL UPPER EXTREMEITY ANGIOGRAM N/A 11/09/2014   Procedure: Left subclavian stent ;  Surgeon: Yates Decamp, MD;  Location: [facility] CATH LAB;  Service: Cardiovascular;  Laterality: N/A;  . VISCERAL ANGIOGRAM N/A 10/25/2014   Procedure: Laurence Slate;  Surgeon: Yates Decamp, MD;  Location: [facility] CATH LAB;  Service: Cardiovascular;  Laterality: N/A;     reports that  has never smoked. she has never used smokeless tobacco. She reports that she does not drink alcohol or use drugs.  Allergies  Allergen Reactions  . Crestor [Rosuvastatin] Other (See Comments)    Myalgias  . Lipitor [Atorvastatin] Other (See Comments)    myalgias  . Zocor [Simvastatin] Other (See Comments)    myalgias    Family History  Problem Relation Age of Onset  . Gallbladder disease Neg Hx      Prior to Admission medications   Medication Sig Start Date End Date Taking? Authorizing Provider  amLODipine (NORVASC) 10 MG tablet Take 10 mg by mouth at bedtime.  10/12/14  Yes [provider]  aspirin EC 81 MG tablet Take 81 mg by mouth  at bedtime.    Yes [provider]  Calcium Citrate-Vitamin D (CALCIUM CITRATE + PO) Take 1 tablet by mouth daily.    Yes [provider]  gabapentin (NEURONTIN) 300 MG capsule Take 300 mg by mouth at bedtime.  04/09/17  Yes [provider]  hydrALAZINE (APRESOLINE) 25 MG tablet Take 25 mg by mouth daily.  03/21/17  Yes [provider]  alendronate (FOSAMAX) 70 MG tablet Take 70 mg by mouth every Monday.  12/08/14   [provider]  potassium chloride (K-DUR) 10 MEQ tablet Take 2 tablets (20 mEq total) by mouth daily. 12/25/16 12/30/16  Elgergawy, Leana Roeawood S, MD  triamcinolone cream (KENALOG) 0.1 % APPLY TO AFFECTED AREA DAILY AFTER SHOWER 10/09/17   [provider]    Physical Exam: Vitals:   10/20/17 1755 10/20/17 1952 10/20/17 1954 10/21/17 0000  BP: (!) 170/119  (!) 157/55 (!) 156/66  Pulse: 74 72 72 75  Resp:  20 20 19   Temp: 99.1 F (37.3 C)     TempSrc: Oral     SpO2: 93% 97% 97% 92%  Weight: 59 kg (130 lb)     Height: 5\' 3"  (1.6 m)       Constitutional: NAD, calm, comfortable Eyes: PERRL, lids and conjunctivae normal ENMT: Mucous membranes are moist. Posterior pharynx clear of any exudate or lesions.Normal dentition.  Neck: normal, supple, no masses, no thyromegaly Respiratory: clear to auscultation bilaterally, no wheezing, no crackles. Normal respiratory effort. No accessory muscle use.  Cardiovascular: Regular rate and rhythm, no murmurs / rubs / gallops. No extremity edema. 2+ pedal pulses. No carotid bruits.  Abdomen: mild TTP. Musculoskeletal: no clubbing / cyanosis. No joint deformity upper and lower extremities. Good ROM, no contractures. Normal muscle tone.  Skin: no rashes, lesions, ulcers. No induration Neurologic: CN 2-12 grossly intact. Sensation intact, DTR normal. Strength 5/5 in all 4.  Psychiatric: Sedated on ativan.   Labs on Admission: I have personally reviewed following labs and imaging studies  CBC: Recent Labs  Lab 10/20/17 2025  WBC 17.2*  HGB 11.1*  HCT 35.2*  MCV 81.1  PLT 173   Basic Metabolic Panel: Recent Labs  Lab 10/20/17 1937  NA 141  K 4.0  CL 102  CO2 26  GLUCOSE 233*  BUN 27*  CREATININE 1.43*  CALCIUM 10.3   GFR: Estimated Creatinine Clearance: 21.6 mL/min (A) (by C-G formula based on SCr of 1.43 mg/dL (H)). Liver Function Tests: Recent Labs  Lab 10/20/17 1937  AST 18  ALT 11*  ALKPHOS 78  BILITOT 1.2  PROT 7.6  ALBUMIN 4.1    Recent Labs  Lab 10/20/17 1937  LIPASE 26   No results for input(s): AMMONIA in the last 168 hours. Coagulation Profile: No results for input(s): INR, PROTIME in the last 168 hours. Cardiac Enzymes: Recent Labs  Lab 10/20/17 1944  TROPONINI <0.03   BNP (last 3 results) No results for input(s): PROBNP in the last 8760 hours. HbA1C: No results for input(s): HGBA1C in the last 72 hours. CBG: Recent Labs  Lab 10/20/17 1759  GLUCAP 209*   Lipid Profile: No results for input(s): CHOL, HDL, LDLCALC, TRIG, CHOLHDL, LDLDIRECT in the last 72 hours. Thyroid Function Tests: No results for input(s): TSH, T4TOTAL, FREET4, T3FREE, THYROIDAB in the last 72 hours. Anemia Panel: No results for input(s): VITAMINB12, FOLATE, FERRITIN, TIBC, IRON, RETICCTPCT in the last 72 hours. Urine analysis:    Component Value Date/Time   COLORURINE YELLOW 12/17/2016 2320  APPEARANCEUR HAZY (A) 12/17/2016 2320   LABSPEC 1.016 12/17/2016 2320   PHURINE 5.0 12/17/2016 2320   GLUCOSEU NEGATIVE 12/17/2016 2320   HGBUR SMALL (A) 12/17/2016 2320   BILIRUBINUR NEGATIVE 12/17/2016 2320   KETONESUR NEGATIVE 12/17/2016 2320   PROTEINUR 100 (A) 12/17/2016 2320   UROBILINOGEN 0.2 06/03/2013 1938   NITRITE NEGATIVE 12/17/2016 2320   LEUKOCYTESUR NEGATIVE 12/17/2016 2320    Radiological Exams on Admission: Ct Abdomen Pelvis Wo Contrast  Result Date: 10/20/2017 CLINICAL DATA:  Abdominal pain EXAM: CT ABDOMEN AND PELVIS WITHOUT CONTRAST TECHNIQUE: Multidetector CT imaging of the abdomen and pelvis was performed following the standard protocol without IV contrast. COMPARISON:  Ultrasound 12/21/2016, CT 12/18/2016 FINDINGS: Lower chest: Lung bases demonstrate partial consolidation in the right middle lobe with minimal nodularity. No significant pleural effusion. Post sternotomy changes. Borderline cardiomegaly. Hepatobiliary: Large stones in the gallbladder with wall thickening present. Gallbladder wall  calcifications are also present. Punctate calcifications near the porta hepatis are unchanged. Pancreas: Unremarkable. No pancreatic ductal dilatation or surrounding inflammatory changes. Spleen: Normal in size without focal abnormality. Adrenals/Urinary Tract: Adrenal glands are within normal limits. No hydronephrosis. Previously noted hypodense lesion in the left kidney difficult to appreciate without contrast. Distended urinary bladder Stomach/Bowel: Marked enlargement of the stomach and proximal small bowel with dilated fluid-filled small bowel measuring up to 4.4 cm. Transition to decompressed small bowel in the left lower quadrant but poorly defined due to absence of oral contrast. No significant bowel wall thickening. Distal small bowel and colon are collapsed. Extensive sigmoid colon diverticula. Vascular/Lymphatic: Extensive aortic atherosclerosis. No aneurysmal dilatation. No significantly enlarged lymph nodes. Reproductive: Status post hysterectomy. No adnexal masses. Other: No significant free air or free fluid. Previously noted coarse ventral calcification above the pubic symphysis is no longer visualized. Musculoskeletal: Grade 1 anterolisthesis L4 on L5. Degenerative changes of the spine. IMPRESSION: 1. Dilated fluid-filled stomach and proximal small bowel consistent with high-grade bowel obstruction, transition to decompressed mid to distal small bowel in the left lower quadrant, transition point poorly defined due to lack of enteral contrast. 2. Large stones in the gallbladder. Wall calcification consistent with porcelain gallbladder. Suspected gallbladder wall thickening as before. 3. Sigmoid colon diverticular disease without acute inflammation. Electronically Signed   By: Jasmine Pang M.D.   On: 10/20/2017 23:23    EKG: Independently reviewed.  Assessment/Plan Principal Problem:   SBO (small bowel obstruction) (HCC) Active Problems:   HTN (hypertension)   DM (diabetes mellitus) (HCC)    Porcelain gallbladder   AKI (acute kidney injury) (HCC)    1. SBO - 1. NGT ordered 2. surg consulted 3. NPO 4. IVF 5. Checking lactate, note infarction last admit.  Lactate was nl on presentation then though. 6. Repeat BMP / CBC in AM 1. Also had reactive leukocytosis during last admission as well. 2. Also had AKI during last admission as well 2. HTN - 1. Continue home BP meds 3. DM - 1. Sensitive SSI q4h 4. Porcelain gallbladder - looks like surgery not recommended last admit.  I would be surprised if they recommended surgery at this time, but surg consulted of course. 5. AKI - Mild with creat 1.4 1. BMP in AM 2. IVF  DVT prophylaxis: Lovenox Code Status: Full Family Communication: Family at bedside Disposition Plan: Home after admit Consults called: Dr. Ezzard Standing called by EDP Admission status: Admit to inpatient   Hillary Bow DO [facility] Pager [phone]  If 7AM-7PM, please contact day team taking care of patient  [email or web address] Password [facility]  10/21/2017, 12:36 AM

## 2017-10-22 ENCOUNTER — Inpatient Hospital Stay (HOSPITAL_COMMUNITY): Payer: Medicare Other

## 2017-10-22 LAB — GLUCOSE, CAPILLARY
GLUCOSE-CAPILLARY: 109 mg/dL — AB (ref 65–99)
GLUCOSE-CAPILLARY: 145 mg/dL — AB (ref 65–99)
Glucose-Capillary: 106 mg/dL — ABNORMAL HIGH (ref 65–99)
Glucose-Capillary: 117 mg/dL — ABNORMAL HIGH (ref 65–99)
Glucose-Capillary: 128 mg/dL — ABNORMAL HIGH (ref 65–99)
Glucose-Capillary: 139 mg/dL — ABNORMAL HIGH (ref 65–99)

## 2017-10-22 LAB — CBC WITH DIFFERENTIAL/PLATELET
BASOS PCT: 0 %
Basophils Absolute: 0 10*3/uL (ref 0.0–0.1)
EOS ABS: 0 10*3/uL (ref 0.0–0.7)
EOS PCT: 0 %
HCT: 30.1 % — ABNORMAL LOW (ref 36.0–46.0)
HEMOGLOBIN: 9.3 g/dL — AB (ref 12.0–15.0)
Lymphocytes Relative: 13 %
Lymphs Abs: 1.5 10*3/uL (ref 0.7–4.0)
MCH: 25.8 pg — AB (ref 26.0–34.0)
MCHC: 30.9 g/dL (ref 30.0–36.0)
MCV: 83.4 fL (ref 78.0–100.0)
MONOS PCT: 12 %
Monocytes Absolute: 1.4 10*3/uL — ABNORMAL HIGH (ref 0.1–1.0)
NEUTROS PCT: 75 %
Neutro Abs: 8.9 10*3/uL — ABNORMAL HIGH (ref 1.7–7.7)
PLATELETS: 149 10*3/uL — AB (ref 150–400)
RBC: 3.61 MIL/uL — ABNORMAL LOW (ref 3.87–5.11)
RDW: 17.5 % — ABNORMAL HIGH (ref 11.5–15.5)
WBC: 11.8 10*3/uL — AB (ref 4.0–10.5)

## 2017-10-22 LAB — BASIC METABOLIC PANEL
Anion gap: 13 (ref 5–15)
BUN: 35 mg/dL — AB (ref 6–20)
CO2: 30 mmol/L (ref 22–32)
CREATININE: 1.54 mg/dL — AB (ref 0.44–1.00)
Calcium: 8.9 mg/dL (ref 8.9–10.3)
Chloride: 104 mmol/L (ref 101–111)
GFR calc non Af Amer: 29 mL/min — ABNORMAL LOW (ref >60)
GFR, EST AFRICAN AMERICAN: 33 mL/min — AB (ref >60)
Glucose, Bld: 135 mg/dL — ABNORMAL HIGH (ref 65–99)
Potassium: 3.2 mmol/L — ABNORMAL LOW (ref 3.5–5.1)
Sodium: 147 mmol/L — ABNORMAL HIGH (ref 135–145)

## 2017-10-22 LAB — PROCALCITONIN: PROCALCITONIN: 3.18 ng/mL

## 2017-10-22 MED ORDER — AMOXICILLIN-POT CLAVULANATE 500-125 MG PO TABS
1.0000 | ORAL_TABLET | Freq: Two times a day (BID) | ORAL | Status: DC
  Administered 2017-10-22 – 2017-10-24 (×4): 500 mg via ORAL
  Filled 2017-10-22 (×5): qty 1

## 2017-10-22 MED ORDER — LACTATED RINGERS IV SOLN
INTRAVENOUS | Status: DC
  Administered 2017-10-22: 16:00:00 via INTRAVENOUS

## 2017-10-22 NOTE — Progress Notes (Signed)
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 intake.  Upon initial evaluation CT of the abdomen was suggestive of small bowel obstruction.  Surgical team was consulted and recommended conservative management with NG tube and IV fluids.  Small bowel protocol was performed and contrast found to be in the rectum, diet was advanced as tolerated and patient stable.  Subjective: Patient seen and examined, she is significantly improved, alert and oriented today.  Very interactive and cooperative.  Does not recall bowel movement or passing flatus today.  Denies abdominal pain, shortness of breath and amputation.  Husband report that she has been having a cough for the past couple of days but unable to bring anything up.  Assessment & Plan: Small bowel obstruction Surgery was consulted, initially treated with NG tube and IV fluid, small bowel protocol showed contrast in the rectum consistent with SBO resolution.  NG tube has been removed and patient has been started on clear liquid diet.  Will advance diet as tolerated, encourage ambulation.  Change IV fluids to LR as BMP shows mild hyponatremia.  Continue gentle hydration.   CAP Chest x-ray shows patchy left lower lung opacity, have reviewed chest x-ray myself and finding consistent with possible pneumonia, given mild hypoxia, cough, dyspnea and mild elevated white count will treat with Augmentin.  Will obtain pro-calcitonin.  Continue to monitor.  Wean O2 as tolerated  Hypertension Continue home  antihypertensive Continue hydralazine IV as needed Monitor BP  Chronic kidney disease stage III No recent serum creatinine last recorded on 10/2016 it was 1.0 Creatinine upon admission 1.4 I suspect component of CKD.  Serum creatinine has been stable during hospital stay.  No component of AKI, continue to monitor.  Type 2 diabetes mellitus Continue SSI Monitor CBGs  DVT prophylaxis: Lovenox Code Status: Full code Family Communication: Family at bedside Disposition Plan: Home in 1-2 days if tolerating diet  Consultants:   General surgery  Procedures:   None  Antimicrobials:  Augmentin 3/20   Objective: Vitals:   10/21/17 1246 10/21/17 2110 10/22/17 0632 10/22/17 1352  BP: (!) 155/63 (!) 164/57 (!) 160/57 (!) 164/58  Pulse: 83 83 71 77  Resp: 19 18 15 15   Temp: 99.2 F (37.3 C) 99.2 F (37.3 C) 98.2 F (36.8 C) 98.4 F (36.9 C)  TempSrc: Oral Oral Oral Oral  SpO2: 94% 99% 97% 98%  Weight:      Height:        Intake/Output Summary (Last 24 hours) at 10/22/2017 1535 Last data filed at 10/22/2017 1352 Gross per 24 hour  Intake 2740 ml  Output 1550 ml  Net 1190 ml   Filed Weights   10/20/17 1755  Weight: 59 kg (130 lb)    Examination:  General exam: Appears calm and comfortable  HEENT: AC/AT, PERRLA, OP moist and clear Respiratory system: Decreased breath sounds on left lower lobe with basilar rales, right fields well aerated.  No signs of respiratory distress Cardiovascular system: S1 & S2 heard, RRR. No JVD, murmurs, rubs or gallops Gastrointestinal system: Abdomen is nondistended, soft and nontender. Normal bowel sounds heard.  Central nervous system: Alert and oriented. No focal neurological deficits. Extremities: No pedal edema. Skin: No rashes Psychiatry: Mood & affect appropriate.    Data Reviewed: I have personally reviewed following labs and imaging studies  CBC: Recent Labs  Lab 10/20/17 2025 10/21/17 0354 10/22/17 0452  WBC 17.2* 14.2*  11.8*  NEUTROABS  --   --  8.9*  HGB 11.1* 11.4* 9.3*  HCT 35.2* 35.8* 30.1*  MCV 81.1 80.8 83.4  PLT 173 156 149*   Basic Metabolic Panel: Recent Labs  Lab 10/20/17 1937 10/21/17 0354 10/22/17 0452  NA 141 143 147*  K 4.0 4.1 3.2*  CL 102 103 104  CO2 26 27 30   GLUCOSE 233* 176* 135*  BUN 27* 32* 35*  CREATININE 1.43* 1.48* 1.54*  CALCIUM 10.3 10.0 8.9   GFR: Estimated Creatinine Clearance: 20.1 mL/min (A) (by C-G formula based on SCr of 1.54 mg/dL (H)). Liver Function Tests: Recent Labs  Lab 10/20/17 1937  AST 18  ALT 11*  ALKPHOS 78  BILITOT 1.2  PROT 7.6  ALBUMIN 4.1   Recent Labs  Lab 10/20/17 1937  LIPASE 26   No results for input(s): AMMONIA in the last 168 hours. Coagulation Profile: No results for input(s): INR, PROTIME in the last 168 hours. Cardiac Enzymes: Recent Labs  Lab 10/20/17 1944  TROPONINI <0.03   BNP (last 3 results) No results for input(s): PROBNP in the last 8760 hours. HbA1C: No results for input(s): HGBA1C in the last 72 hours. CBG: Recent Labs  Lab 10/21/17 1952 10/21/17 2350 10/22/17 0421 10/22/17 0728 10/22/17 1203  GLUCAP 136* 109* 145* 106* 117*   Lipid Profile: No results for input(s): CHOL, HDL, LDLCALC, TRIG, CHOLHDL, LDLDIRECT in the last 72 hours. Thyroid Function Tests: No results for input(s): TSH, T4TOTAL, FREET4, T3FREE, THYROIDAB in the last 72 hours. Anemia Panel: No results for input(s): VITAMINB12, FOLATE, FERRITIN, TIBC, IRON, RETICCTPCT in the last 72 hours. Sepsis Labs: Recent Labs  Lab 10/21/17 0128 10/21/17 0135 10/21/17 0354  LATICACIDVEN 1.2 1.16 1.6    No results found for this or any previous visit (from the past 240 hour(s)).    Radiology Studies: Ct Abdomen Pelvis Wo Contrast  Result Date: 10/20/2017 CLINICAL DATA:  Abdominal pain EXAM: CT ABDOMEN AND PELVIS WITHOUT CONTRAST TECHNIQUE: Multidetector CT imaging of the abdomen and pelvis was performed following the standard protocol  without IV contrast. COMPARISON:  Ultrasound 12/21/2016, CT 12/18/2016 FINDINGS: Lower chest: Lung bases demonstrate partial consolidation in the right middle lobe with minimal nodularity. No significant pleural effusion. Post sternotomy changes. Borderline cardiomegaly. Hepatobiliary: Large stones in the gallbladder with wall thickening present. Gallbladder wall calcifications are also present. Punctate calcifications near the porta hepatis are unchanged. Pancreas: Unremarkable. No pancreatic ductal dilatation or surrounding inflammatory changes. Spleen: Normal in size without focal abnormality. Adrenals/Urinary Tract: Adrenal glands are within normal limits. No hydronephrosis. Previously noted hypodense lesion in the left kidney difficult to appreciate without contrast. Distended urinary bladder Stomach/Bowel: Marked enlargement of the stomach and proximal small bowel with dilated fluid-filled small bowel measuring up to 4.4 cm. Transition to decompressed small bowel in the left lower quadrant but poorly defined due to absence of oral contrast. No significant bowel wall thickening. Distal small bowel and colon are collapsed. Extensive sigmoid colon diverticula. Vascular/Lymphatic: Extensive aortic atherosclerosis. No aneurysmal dilatation. No significantly enlarged lymph nodes. Reproductive: Status post hysterectomy. No adnexal masses. Other: No significant free air or free fluid. Previously noted coarse ventral calcification above the pubic  symphysis is no longer visualized. Musculoskeletal: Grade 1 anterolisthesis L4 on L5. Degenerative changes of the spine. IMPRESSION: 1. Dilated fluid-filled stomach and proximal small bowel consistent with high-grade bowel obstruction, transition to decompressed mid to distal small bowel in the left lower quadrant, transition point poorly defined due to lack of enteral contrast. 2. Large stones in the gallbladder. Wall calcification consistent with porcelain gallbladder.  Suspected gallbladder wall thickening as before. 3. Sigmoid colon diverticular disease without acute inflammation. Electronically Signed   By: Jasmine Pang Sonya.D.   On: 10/20/2017 23:23   Dg Chest Port 1 View  Result Date: 10/22/2017 CLINICAL DATA:  84 year old female with shortness of breath. EXAM: PORTABLE CHEST 1 VIEW COMPARISON:  10/21/2017 and earlier. FINDINGS: Portable AP semi upright view at 1443 hours. Enteric tube has been removed. Stable cardiac size and mediastinal contours. Prior sternotomy. Calcified aortic atherosclerosis. Visualized tracheal air column is within normal limits. The right lung is clear allowing for portable technique. Persistent patchy opacity in the left mid and lower lung. No pneumothorax or definite pleural effusion. Partially visible retained oral contrast in bowel loops in the abdomen. IMPRESSION: 1. Patchy nonspecific left mid and lower lung opacity stable since yesterday but new since 10/20/2017. No definite pleural effusion. 2. The right lung remains clear. 3. Enteric tube removed. Electronically Signed   By: Odessa Fleming Sonya.D.   On: 10/22/2017 15:28   Dg Chest Port 1 View  Result Date: 10/21/2017 CLINICAL DATA:  Increased breath sounds EXAM: PORTABLE CHEST 1 VIEW COMPARISON:  12/21/2016 FINDINGS: Cardiac shadow is mildly enlarged. Postsurgical changes are again noted. Nasogastric catheter is seen within the stomach. Changes of prior arterial stenting likely in the left subclavian artery are seen. Some patchy changes are noted in the left lung base and left mid lung increased from the prior exam. No bony abnormality is seen. IMPRESSION: Patchy infiltrates in the left mid and lower lung. Electronically Signed   By: Alcide Clever Sonya.D.   On: 10/21/2017 09:59   Dg Abd Portable 1v  Result Date: 10/22/2017 CLINICAL DATA:  Mid and lower abdominal discomfort today. History of partial small bowel resection and May 2018 EXAM: PORTABLE ABDOMEN - 1 VIEW COMPARISON:  Portable AP view of  the abdomen of October 21, 2017 FINDINGS: The previously administered oral contrast has traversed the small bowel and reached the rectum. No significant residual contrast is noted within the small bowel. No fluid filled non contrasted small-bowel loops are demonstrated. There is some gas within a small bowel loop to the left of midline. There is surgical suture material in the right lower quadrant. IMPRESSION: No evidence of small bowel obstruction. Electronically Signed   By: David  [place] Sonya.D.   On: 10/22/2017 10:07   Dg Abd Portable 1v-small Bowel Obstruction Protocol-initial, 8 Hr Delay  Result Date: 10/21/2017 CLINICAL DATA:  Small-bowel obstruction EXAM: PORTABLE ABDOMEN - 1 VIEW COMPARISON:  Portable exam 1757 hours compared to 0901 hours FINDINGS: Small amount of retained radiodense material within the stomach and duodenum. Diluted contrast opacifies dilated small bowel loops in the LEFT mid abdomen. Anastomotic staple line in RIGHT mid abdomen. No definite bowel wall thickening. Bones demineralized. IMPRESSION: Opacified distended small bowel loops in LEFT mid abdomen. Retained radiodense material within the stomach and duodenum. Electronically Signed   By: Ulyses Southward Sonya.D.   On: 10/21/2017 18:40   Dg Abd Portable 1v  Result Date: 10/21/2017 CLINICAL DATA:  Nasogastric tube placement EXAM: PORTABLE ABDOMEN - 1 VIEW COMPARISON:  CT abdomen and pelvis October 20, 2017 FINDINGS: Nasogastric tube tip and side port are in the stomach. There are loops of dilated small bowel suggesting a degree of bowel obstruction. No free air. There is atelectatic change in the left lung base. IMPRESSION: Nasogastric tube tip and side port in stomach. Visualized bowel gas shows evidence of a degree of bowel obstruction. No free air evident. Electronically Signed   By: Bretta Bang III Sonya.D.   On: 10/21/2017 09:23      Scheduled Meds: . amLODipine  10 mg Oral QHS  . aspirin EC  81 mg Oral QHS  . enoxaparin  (LOVENOX) injection  30 mg Subcutaneous Daily  . gabapentin  300 mg Oral QHS  . hydrALAZINE  25 mg Oral Daily  . insulin aspart  0-9 Units Subcutaneous Q4H   Continuous Infusions: . lactated ringers       LOS: 1 day    Time spent: Total of 25 minutes spent with pt, greater than 50% of which was spent in discussion of  treatment, counseling and coordination of care  Latrelle Dodrill, MD Pager: Text Page via [email or web address]   If 7PM-7AM, please contact night-coverage [email or web address] 10/22/2017, 3:35 PM   Note - This record has been created using [employer]. Chart creation errors have been sought, but may not always have been located. Such creation errors do not reflect on the standard of medical care.

## 2017-10-22 NOTE — Progress Notes (Signed)
[facility] Progress Note     Subjective: CC: patient is more alert this morning. Husband and nephew are at bedside. Patient reports mild abdominal pain but is unable to describe it. Also endorses mild back pain, present before admission. Denies flatus or bowel movement.  Objective: Vital signs in last 24 hours: Temp:  [98.2 F (36.8 C)-99.2 F (37.3 C)] 98.2 F (36.8 C) (03/20 [id number]) Pulse Rate:  [71-83] 71 (03/20 0632) Resp:  [15-19] 15 (03/20 [id number]) BP: (155-164)/(57-63) 160/57 (03/20 0632) SpO2:  [94 %-99 %] 97 % (03/20 [id number])    Intake/Output from previous day: 03/19 0701 - 03/20 0700 In: 3000 [I.V.:3000] Out: 2875 [Urine:350; Emesis/NG output:2525] Intake/Output this shift: No intake/output data recorded.  PE: Gen:  Alert, NAD, pleasant Card:  Regular rate and rhythm, pedal pulses 2+ BL, no LE edema  Pulm:  Normal effort Abd: Soft, subjectively tender to palpation without rebound or guarding, non-distended, hypoactive BS Skin: warm and dry, no rashes  Psych: alert and oriented to person, place, time ([place] initially stated the date was [place])  Lab Results:  Recent Labs    10/21/17 0354 10/22/17 0452  WBC 14.2* 11.8*  HGB 11.4* 9.3*  HCT 35.8* 30.1*  PLT 156 149*   BMET Recent Labs    10/21/17 0354 10/22/17 0452  NA 143 147*  K 4.1 3.2*  CL 103 104  CO2 27 30  GLUCOSE 176* 135*  BUN 32* 35*  CREATININE 1.48* 1.54*  CALCIUM 10.0 8.9   PT/INR No results for input(s): LABPROT, INR in the last 72 hours. CMP     Component Value Date/Time   NA 147 (H) 10/22/2017 0452   K 3.2 (L) 10/22/2017 0452   CL 104 10/22/2017 0452   CO2 30 10/22/2017 0452   GLUCOSE 135 (H) 10/22/2017 0452   BUN 35 (H) 10/22/2017 0452   CREATININE 1.54 (H) 10/22/2017 0452   CALCIUM 8.9 10/22/2017 0452   PROT 7.6 10/20/2017 1937   ALBUMIN 4.1 10/20/2017 1937   AST 18 10/20/2017 1937   ALT 11 (L) 10/20/2017 1937   ALKPHOS 78 10/20/2017 1937    BILITOT 1.2 10/20/2017 1937   GFRNONAA 29 (L) 10/22/2017 0452   GFRAA 33 (L) 10/22/2017 0452   Lipase     Component Value Date/Time   LIPASE 26 10/20/2017 1937       Studies/Results: Ct Abdomen Pelvis Wo Contrast  Result Date: 10/20/2017 CLINICAL DATA:  Abdominal pain EXAM: CT ABDOMEN AND PELVIS WITHOUT CONTRAST TECHNIQUE: Multidetector CT imaging of the abdomen and pelvis was performed following the standard protocol without IV contrast. COMPARISON:  Ultrasound 12/21/2016, CT 12/18/2016 FINDINGS: Lower chest: Lung bases demonstrate partial consolidation in the right middle lobe with minimal nodularity. No significant pleural effusion. Post sternotomy changes. Borderline cardiomegaly. Hepatobiliary: Large stones in the gallbladder with wall thickening present. Gallbladder wall calcifications are also present. Punctate calcifications near the porta hepatis are unchanged. Pancreas: Unremarkable. No pancreatic ductal dilatation or surrounding inflammatory changes. Spleen: Normal in size without focal abnormality. Adrenals/Urinary Tract: Adrenal glands are within normal limits. No hydronephrosis. Previously noted hypodense lesion in the left kidney difficult to appreciate without contrast. Distended urinary bladder Stomach/Bowel: Marked enlargement of the stomach and proximal small bowel with dilated fluid-filled small bowel measuring up to 4.4 cm. Transition to decompressed small bowel in the left lower quadrant but poorly defined due to absence of oral contrast. No significant bowel wall thickening. Distal small bowel and colon are collapsed. Extensive sigmoid  colon diverticula. Vascular/Lymphatic: Extensive aortic atherosclerosis. No aneurysmal dilatation. No significantly enlarged lymph nodes. Reproductive: Status post hysterectomy. No adnexal masses. Other: No significant free air or free fluid. Previously noted coarse ventral calcification above the pubic symphysis is no longer visualized.  Musculoskeletal: Grade 1 anterolisthesis L4 on L5. Degenerative changes of the spine. IMPRESSION: 1. Dilated fluid-filled stomach and proximal small bowel consistent with high-grade bowel obstruction, transition to decompressed mid to distal small bowel in the left lower quadrant, transition point poorly defined due to lack of enteral contrast. 2. Large stones in the gallbladder. Wall calcification consistent with porcelain gallbladder. Suspected gallbladder wall thickening as before. 3. Sigmoid colon diverticular disease without acute inflammation. Electronically Signed   By: Jasmine Pang M.D.   On: 10/20/2017 23:23   Dg Chest Port 1 View  Result Date: 10/21/2017 CLINICAL DATA:  Increased breath sounds EXAM: PORTABLE CHEST 1 VIEW COMPARISON:  12/21/2016 FINDINGS: Cardiac shadow is mildly enlarged. Postsurgical changes are again noted. Nasogastric catheter is seen within the stomach. Changes of prior arterial stenting likely in the left subclavian artery are seen. Some patchy changes are noted in the left lung base and left mid lung increased from the prior exam. No bony abnormality is seen. IMPRESSION: Patchy infiltrates in the left mid and lower lung. Electronically Signed   By: Alcide Clever M.D.   On: 10/21/2017 09:59   Dg Abd Portable 1v-small Bowel Obstruction Protocol-initial, 8 Hr Delay  Result Date: 10/21/2017 CLINICAL DATA:  Small-bowel obstruction EXAM: PORTABLE ABDOMEN - 1 VIEW COMPARISON:  Portable exam 1757 hours compared to 0901 hours FINDINGS: Small amount of retained radiodense material within the stomach and duodenum. Diluted contrast opacifies dilated small bowel loops in the LEFT mid abdomen. Anastomotic staple line in RIGHT mid abdomen. No definite bowel wall thickening. Bones demineralized. IMPRESSION: Opacified distended small bowel loops in LEFT mid abdomen. Retained radiodense material within the stomach and duodenum. Electronically Signed   By: Ulyses Southward M.D.   On: 10/21/2017  18:40   Dg Abd Portable 1v  Result Date: 10/21/2017 CLINICAL DATA:  Nasogastric tube placement EXAM: PORTABLE ABDOMEN - 1 VIEW COMPARISON:  CT abdomen and pelvis October 20, 2017 FINDINGS: Nasogastric tube tip and side port are in the stomach. There are loops of dilated small bowel suggesting a degree of bowel obstruction. No free air. There is atelectatic change in the left lung base. IMPRESSION: Nasogastric tube tip and side port in stomach. Visualized bowel gas shows evidence of a degree of bowel obstruction. No free air evident. Electronically Signed   By: Bretta Bang III M.D.   On: 10/21/2017 09:23    Anti-infectives: Anti-infectives (From admission, onward)   None     Assessment/Plan SBO, recurrent - PMH ex lap, SBR, appendectomy 12/2016 by DB - NPO, IVF - continue NG tube to LIWS - small bowel protocol 3/19 >> contrast still in duodenum; repeat AXR pending - mobilize!  - given age/comorbidies, surgery is high risk. Emergent surgery not indicated at this time. We will follow. Will discuss patient/family wishes regarding surgery.   Respiratory crackles on exam - AXR with infiltrates of L lung field HTN DM AKI - SCr 1.54 this AM porcelain gallbladder   FEN: NPO, IVF ID: none; leukocytosis improving VTE: SCD's, Lovenox  Foley: none   LOS: 1 day    Adam Phenix , [facility] 10/22/2017, 9:12 AM Pager: [phone] Consults: [phone] Mon-Fri 7:00 am-4:30 pm Sat-Sun 7:00 am-11:30 am

## 2017-10-22 NOTE — Progress Notes (Signed)
PHARMACY NOTE:  ANTIMICROBIAL RENAL DOSAGE ADJUSTMENT  Current antimicrobial regimen includes a mismatch between antimicrobial dosage and estimated renal function.  As per policy approved by the Pharmacy & Therapeutics and Medical Executive Committees, the antimicrobial dosage will be adjusted accordingly.  Current antimicrobial dosage:  Augmentin 875mg  PO BID  Indication: CAP  Renal Function:  Estimated Creatinine Clearance: 20.1 mL/min (A) (by C-G formula based on SCr of 1.54 mg/dL (H)). []      On intermittent HD, scheduled: []      On CRRT    Antimicrobial dosage has been changed to: Augmentin 500mg  PO BID  Additional comments:   Thank you for allowing pharmacy to be a part of this patient's care.  Elson ClanLilliston, Aneta Hendershott Michelle, [facility] 10/22/2017 3:55 PM

## 2017-10-23 DIAGNOSIS — J181 Lobar pneumonia, unspecified organism: Secondary | ICD-10-CM

## 2017-10-23 DIAGNOSIS — E119 Type 2 diabetes mellitus without complications: Secondary | ICD-10-CM

## 2017-10-23 DIAGNOSIS — K56609 Unspecified intestinal obstruction, unspecified as to partial versus complete obstruction: Principal | ICD-10-CM

## 2017-10-23 LAB — CBC WITH DIFFERENTIAL/PLATELET
BASOS ABS: 0 10*3/uL (ref 0.0–0.1)
BASOS PCT: 0 %
Eosinophils Absolute: 0 10*3/uL (ref 0.0–0.7)
Eosinophils Relative: 0 %
HEMATOCRIT: 29.7 % — AB (ref 36.0–46.0)
HEMOGLOBIN: 9.2 g/dL — AB (ref 12.0–15.0)
Lymphocytes Relative: 14 %
Lymphs Abs: 1.5 10*3/uL (ref 0.7–4.0)
MCH: 25.8 pg — ABNORMAL LOW (ref 26.0–34.0)
MCHC: 31 g/dL (ref 30.0–36.0)
MCV: 83.4 fL (ref 78.0–100.0)
Monocytes Absolute: 1.2 10*3/uL — ABNORMAL HIGH (ref 0.1–1.0)
Monocytes Relative: 11 %
NEUTROS ABS: 8.2 10*3/uL — AB (ref 1.7–7.7)
NEUTROS PCT: 75 %
Platelets: 144 10*3/uL — ABNORMAL LOW (ref 150–400)
RBC: 3.56 MIL/uL — AB (ref 3.87–5.11)
RDW: 17.2 % — ABNORMAL HIGH (ref 11.5–15.5)
WBC: 10.9 10*3/uL — AB (ref 4.0–10.5)

## 2017-10-23 LAB — BASIC METABOLIC PANEL
ANION GAP: 12 (ref 5–15)
BUN: 29 mg/dL — ABNORMAL HIGH (ref 6–20)
CALCIUM: 8.6 mg/dL — AB (ref 8.9–10.3)
CO2: 30 mmol/L (ref 22–32)
Chloride: 104 mmol/L (ref 101–111)
Creatinine, Ser: 1.36 mg/dL — ABNORMAL HIGH (ref 0.44–1.00)
GFR, EST AFRICAN AMERICAN: 38 mL/min — AB (ref >60)
GFR, EST NON AFRICAN AMERICAN: 33 mL/min — AB (ref >60)
Glucose, Bld: 102 mg/dL — ABNORMAL HIGH (ref 65–99)
Potassium: 3 mmol/L — ABNORMAL LOW (ref 3.5–5.1)
Sodium: 146 mmol/L — ABNORMAL HIGH (ref 135–145)

## 2017-10-23 LAB — GLUCOSE, CAPILLARY
GLUCOSE-CAPILLARY: 103 mg/dL — AB (ref 65–99)
Glucose-Capillary: 134 mg/dL — ABNORMAL HIGH (ref 65–99)
Glucose-Capillary: 186 mg/dL — ABNORMAL HIGH (ref 65–99)
Glucose-Capillary: 213 mg/dL — ABNORMAL HIGH (ref 65–99)
Glucose-Capillary: 237 mg/dL — ABNORMAL HIGH (ref 65–99)
Glucose-Capillary: 93 mg/dL (ref 65–99)
Glucose-Capillary: 97 mg/dL (ref 65–99)

## 2017-10-23 LAB — MAGNESIUM: MAGNESIUM: 1.5 mg/dL — AB (ref 1.7–2.4)

## 2017-10-23 MED ORDER — POTASSIUM CHLORIDE 10 MEQ/100ML IV SOLN
10.0000 meq | INTRAVENOUS | Status: AC
  Administered 2017-10-23 (×3): 10 meq via INTRAVENOUS
  Filled 2017-10-23 (×3): qty 100

## 2017-10-23 NOTE — Progress Notes (Signed)
[facility] Progress Note     Subjective: CC:  Alert and oriented this morning. Denies abdominal pain. Tolerating water and chicken broth without nausea, vomiting, or pain. +flatus. Reports one BM yesterday. Mobilizing in hallway.   Objective: Vital signs in last 24 hours: Temp:  [98.4 F (36.9 C)-99.4 F (37.4 C)] 99.3 F (37.4 C) (03/21 0531) Pulse Rate:  [72-77] 72 (03/21 0531) Resp:  [15-16] 16 (03/21 0531) BP: (139-172)/(51-80) 139/80 (03/21 0531) SpO2:  [91 %-98 %] 95 % (03/21 0531)    Intake/Output from previous day: 03/20 0701 - 03/21 0700 In: 2716.3 [P.O.:440; I.V.:2276.3] Out: 200 [Urine:200] Intake/Output this shift: No intake/output data recorded.  PE: Gen:  Alert, NAD, pleasant Card:  Regular rate and rhythm, no LE edema Pulm:  Normal effort, diminished breath sounds left lung base, right anterior lung field is clear to auscultation Abd: Soft, non-tender, non-distended, bowel sounds present in all 4 quadrants, previous laparotomy scar noted Skin: warm and dry, no rashes  Psych: A&Ox3   Lab Results:  Recent Labs    10/22/17 0452 10/23/17 0510  WBC 11.8* 10.9*  HGB 9.3* 9.2*  HCT 30.1* 29.7*  PLT 149* 144*   BMET Recent Labs    10/22/17 0452 10/23/17 0510  NA 147* 146*  K 3.2* 3.0*  CL 104 104  CO2 30 30  GLUCOSE 135* 102*  BUN 35* 29*  CREATININE 1.54* 1.36*  CALCIUM 8.9 8.6*   PT/INR No results for input(s): LABPROT, INR in the last 72 hours. CMP     Component Value Date/Time   NA 146 (H) 10/23/2017 0510   K 3.0 (L) 10/23/2017 0510   CL 104 10/23/2017 0510   CO2 30 10/23/2017 0510   GLUCOSE 102 (H) 10/23/2017 0510   BUN 29 (H) 10/23/2017 0510   CREATININE 1.36 (H) 10/23/2017 0510   CALCIUM 8.6 (L) 10/23/2017 0510   PROT 7.6 10/20/2017 1937   ALBUMIN 4.1 10/20/2017 1937   AST 18 10/20/2017 1937   ALT 11 (L) 10/20/2017 1937   ALKPHOS 78 10/20/2017 1937   BILITOT 1.2 10/20/2017 1937   GFRNONAA 33 (L) 10/23/2017 0510    GFRAA 38 (L) 10/23/2017 0510   Lipase     Component Value Date/Time   LIPASE 26 10/20/2017 1937       Studies/Results: Dg Chest Port 1 View  Result Date: 10/22/2017 CLINICAL DATA:  84 year old female with shortness of breath. EXAM: PORTABLE CHEST 1 VIEW COMPARISON:  10/21/2017 and earlier. FINDINGS: Portable AP semi upright view at 1443 hours. Enteric tube has been removed. Stable cardiac size and mediastinal contours. Prior sternotomy. Calcified aortic atherosclerosis. Visualized tracheal air column is within normal limits. The right lung is clear allowing for portable technique. Persistent patchy opacity in the left mid and lower lung. No pneumothorax or definite pleural effusion. Partially visible retained oral contrast in bowel loops in the abdomen. IMPRESSION: 1. Patchy nonspecific left mid and lower lung opacity stable since yesterday but new since 10/20/2017. No definite pleural effusion. 2. The right lung remains clear. 3. Enteric tube removed. Electronically Signed   By: Odessa Fleming M.D.   On: 10/22/2017 15:28   Dg Chest Port 1 View  Result Date: 10/21/2017 CLINICAL DATA:  Increased breath sounds EXAM: PORTABLE CHEST 1 VIEW COMPARISON:  12/21/2016 FINDINGS: Cardiac shadow is mildly enlarged. Postsurgical changes are again noted. Nasogastric catheter is seen within the stomach. Changes of prior arterial stenting likely in the left subclavian artery are seen. Some patchy changes are  noted in the left lung base and left mid lung increased from the prior exam. No bony abnormality is seen. IMPRESSION: Patchy infiltrates in the left mid and lower lung. Electronically Signed   By: Alcide CleverMark  Lukens M.D.   On: 10/21/2017 09:59   Dg Abd Portable 1v  Result Date: 10/22/2017 CLINICAL DATA:  Mid and lower abdominal discomfort today. History of partial small bowel resection and May 2018 EXAM: PORTABLE ABDOMEN - 1 VIEW COMPARISON:  Portable AP view of the abdomen of October 21, 2017 FINDINGS: The previously  administered oral contrast has traversed the small bowel and reached the rectum. No significant residual contrast is noted within the small bowel. No fluid filled non contrasted small-bowel loops are demonstrated. There is some gas within a small bowel loop to the left of midline. There is surgical suture material in the right lower quadrant. IMPRESSION: No evidence of small bowel obstruction. Electronically Signed   By: David  SwazilandJordan M.D.   On: 10/22/2017 10:07   Dg Abd Portable 1v-small Bowel Obstruction Protocol-initial, 8 Hr Delay  Result Date: 10/21/2017 CLINICAL DATA:  Small-bowel obstruction EXAM: PORTABLE ABDOMEN - 1 VIEW COMPARISON:  Portable exam 1757 hours compared to 0901 hours FINDINGS: Small amount of retained radiodense material within the stomach and duodenum. Diluted contrast opacifies dilated small bowel loops in the LEFT mid abdomen. Anastomotic staple line in RIGHT mid abdomen. No definite bowel wall thickening. Bones demineralized. IMPRESSION: Opacified distended small bowel loops in LEFT mid abdomen. Retained radiodense material within the stomach and duodenum. Electronically Signed   By: Ulyses SouthwardMark  Boles M.D.   On: 10/21/2017 18:40   Dg Abd Portable 1v  Result Date: 10/21/2017 CLINICAL DATA:  Nasogastric tube placement EXAM: PORTABLE ABDOMEN - 1 VIEW COMPARISON:  CT abdomen and pelvis October 20, 2017 FINDINGS: Nasogastric tube tip and side port are in the stomach. There are loops of dilated small bowel suggesting a degree of bowel obstruction. No free air. There is atelectatic change in the left lung base. IMPRESSION: Nasogastric tube tip and side port in stomach. Visualized bowel gas shows evidence of a degree of bowel obstruction. No free air evident. Electronically Signed   By: Bretta BangWilliam  Woodruff III M.D.   On: 10/21/2017 09:23    Anti-infectives: Anti-infectives (From admission, onward)   Start     Dose/Rate Route Frequency Ordered Stop   10/22/17 1700  amoxicillin-clavulanate  (AUGMENTIN) 500-125 MG per tablet 500 mg     1 tablet Oral Every 12 hours 10/22/17 1546         Assessment/Plan SBO, recurrent - PMH ex lap, SBR, appendectomy 12/2016 by DB - small bowel protocol 3/19 >> contrast moved to rectum and NG tube removed - tolerating clears, advance to SOFT diet as tolerated. - mobilize!  - stable for discharge from surgical perspective once tolerating soft diet. No outpatient surgical follow up necessary.   CAP  HTN DM porcelain gallbladder  FEN: NPO, IVF ID: none; leukocytosis improving VTE: SCD's, Lovenox  Foley: none    LOS: 2 days    Adam PhenixElizabeth S Simaan , [facility] 10/23/2017, 8:50 AM Pager: [phone] Consults: [phone] Mon-Fri 7:00 am-4:30 pm Sat-Sun 7:00 am-11:30 am

## 2017-10-23 NOTE — Progress Notes (Signed)
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 intake.  Upon initial evaluation CT of the abdomen was suggestive of small bowel obstruction.  Surgical team was consulted and recommended conservative management with NG tube and IV fluids.  Small bowel protocol was performed and contrast found to be in the rectum, diet was advanced as tolerated and patient stable.  Subjective: Patient seen and examined,  sitting up in chair.  Positive bowel movement this morning, continue to past flatus.  Tolerating diet well.  Denies abdominal pain, nausea and vomiting.  Assessment & Plan: Small bowel obstruction Surgery was consulted, initially treated with NG tube and IV fluid, small bowel protocol showed contrast in the rectum consistent with SBO resolution.  NG tube was removed and diet has been advanced as tolerated.  Patient was advanced to soft diet today if continues to tolerate well will discharge in the morning.   CAP Chest x-ray consistent with pneumonia, left lower lobe opacity, patient on Augmentin pro-calcitonin elevated.  Patient has been successfully weaned off oxygen.   Hypertension BP stable Continue current regimen Continue hydralazine IV as needed Monitor BP  Chronic kidney disease stage III No recent serum creatinine last recorded on 10/2016 it was 1.0 Creatinine has been stable during admission I suspect component of CKD. Continue to monitor  Type 2 diabetes mellitus Continue SSI Monitor CBGs  DVT prophylaxis: Lovenox Code Status: Full  code Family Communication: Family at bedside Disposition Plan: Home in a.m. if tolerating diet  Consultants:   General surgery  Procedures:   None  Antimicrobials:  Augmentin 3/20   Objective: Vitals:   10/22/17 1352 10/22/17 2102 10/23/17 0531 10/23/17 1325  BP: (!) 164/58 (!) 172/51 139/80 (!) 170/59  Pulse: 77 76 72 69  Resp: 15 16 16 16   Temp: 98.4 F (36.9 C) 99.4 F (37.4 C) 99.3 F (37.4 C) 98.2 F (36.8 C)  TempSrc: Oral Oral Oral Oral  SpO2: 98% 91% 95% 90%  Weight:      Height:        Intake/Output Summary (Last 24 hours) at 10/23/2017 1441 Last data filed at 10/23/2017 1400 Gross per 24 hour  Intake 2256.25 ml  Output -  Net 2256.25 ml   Filed Weights   10/20/17 1755  Weight: 59 kg (130 lb)    Examination:  General: Pt is alert, awake, not in acute distress Cardiovascular: RRR, S1/S2 +, no rubs, no gallops Respiratory: Air entry improved, mild decreased breath sounds in the left lower lobe. Abdominal: Soft, NT, ND, bowel sounds + Extremities: no edema   Data Reviewed: I have personally reviewed following labs and imaging studies  CBC: Recent Labs  Lab 10/20/17 2025 10/21/17 0354 10/22/17 0452 10/23/17 0510  WBC 17.2* 14.2* 11.8* 10.9*  NEUTROABS  --   --  8.9* 8.2*  HGB 11.1* 11.4* 9.3* 9.2*  HCT 35.2* 35.8* 30.1* 29.7*  MCV 81.1 80.8 83.4 83.4  PLT 173 156 149* 144*   Basic Metabolic Panel: Recent Labs  Lab 10/20/17 1937 10/21/17 0354 10/22/17 0452 10/23/17 0510  NA 141 143 147* 146*  K 4.0  4.1 3.2* 3.0*  CL 102 103 104 104  CO2 26 27 30 30   GLUCOSE 233* 176* 135* 102*  BUN 27* 32* 35* 29*  CREATININE 1.43* 1.48* 1.54* 1.36*  CALCIUM 10.3 10.0 8.9 8.6*   GFR: Estimated Creatinine Clearance: 22.7 mL/min (A) (by C-G formula based on SCr of 1.36 mg/dL (H)). Liver Function Tests: Recent Labs  Lab 10/20/17 1937  AST 18  ALT 11*  ALKPHOS 78  BILITOT 1.2  PROT 7.6  ALBUMIN 4.1   Recent Labs  Lab 10/20/17 1937    LIPASE 26   No results for input(s): AMMONIA in the last 168 hours. Coagulation Profile: No results for input(s): INR, PROTIME in the last 168 hours. Cardiac Enzymes: Recent Labs  Lab 10/20/17 1944  TROPONINI <0.03   BNP (last 3 results) No results for input(s): PROBNP in the last 8760 hours. HbA1C: No results for input(s): HGBA1C in the last 72 hours. CBG: Recent Labs  Lab 10/22/17 2004 10/23/17 0009 10/23/17 0415 10/23/17 0719 10/23/17 1114  GLUCAP 139* 97 103* 93 237*   Lipid Profile: No results for input(s): CHOL, HDL, LDLCALC, TRIG, CHOLHDL, LDLDIRECT in the last 72 hours. Thyroid Function Tests: No results for input(s): TSH, T4TOTAL, FREET4, T3FREE, THYROIDAB in the last 72 hours. Anemia Panel: No results for input(s): VITAMINB12, FOLATE, FERRITIN, TIBC, IRON, RETICCTPCT in the last 72 hours. Sepsis Labs: Recent Labs  Lab 10/21/17 0128 10/21/17 0135 10/21/17 0354 10/22/17 1631  PROCALCITON  --   --   --  3.18  LATICACIDVEN 1.2 1.16 1.6  --     No results found for this or any previous visit (from the past 240 hour(s)).    Radiology Studies: Dg Chest Port 1 View  Result Date: 10/22/2017 CLINICAL DATA:  84 year old female with shortness of breath. EXAM: PORTABLE CHEST 1 VIEW COMPARISON:  10/21/2017 and earlier. FINDINGS: Portable AP semi upright view at 1443 hours. Enteric tube has been removed. Stable cardiac size and mediastinal contours. Prior sternotomy. Calcified aortic atherosclerosis. Visualized tracheal air column is within normal limits. The right lung is clear allowing for portable technique. Persistent patchy opacity in the left mid and lower lung. No pneumothorax or definite pleural effusion. Partially visible retained oral contrast in bowel loops in the abdomen. IMPRESSION: 1. Patchy nonspecific left mid and lower lung opacity stable since yesterday but new since 10/20/2017. No definite pleural effusion. 2. The right lung remains clear. 3. Enteric  tube removed. Electronically Signed   By: Odessa Fleming M.D.   On: 10/22/2017 15:28   Dg Abd Portable 1v  Result Date: 10/22/2017 CLINICAL DATA:  Mid and lower abdominal discomfort today. History of partial small bowel resection and May 2018 EXAM: PORTABLE ABDOMEN - 1 VIEW COMPARISON:  Portable AP view of the abdomen of October 21, 2017 FINDINGS: The previously administered oral contrast has traversed the small bowel and reached the rectum. No significant residual contrast is noted within the small bowel. No fluid filled non contrasted small-bowel loops are demonstrated. There is some gas within a small bowel loop to the left of midline. There is surgical suture material in the right lower quadrant. IMPRESSION: No evidence of small bowel obstruction. Electronically Signed   By: David  [place] M.D.   On: 10/22/2017 10:07   Dg Abd Portable 1v-small Bowel Obstruction Protocol-initial, 8 Hr Delay  Result Date: 10/21/2017 CLINICAL DATA:  Small-bowel obstruction EXAM: PORTABLE ABDOMEN - 1 VIEW COMPARISON:  Portable exam 1757 hours compared to 0901 hours FINDINGS: Small amount  of retained radiodense material within the stomach and duodenum. Diluted contrast opacifies dilated small bowel loops in the LEFT mid abdomen. Anastomotic staple line in RIGHT mid abdomen. No definite bowel wall thickening. Bones demineralized. IMPRESSION: Opacified distended small bowel loops in LEFT mid abdomen. Retained radiodense material within the stomach and duodenum. Electronically Signed   By: Ulyses Southward M.D.   On: 10/21/2017 18:40      Scheduled Meds: . amLODipine  10 mg Oral QHS  . amoxicillin-clavulanate  1 tablet Oral Q12H  . aspirin EC  81 mg Oral QHS  . enoxaparin (LOVENOX) injection  30 mg Subcutaneous Daily  . gabapentin  300 mg Oral QHS  . hydrALAZINE  25 mg Oral Daily  . insulin aspart  0-9 Units Subcutaneous Q4H   Continuous Infusions: . lactated ringers Stopped (10/23/17 1000)     LOS: 2 days    Time spent:  Total of 15 minutes spent with pt, greater than 50% of which was spent in discussion of  treatment, counseling and coordination of care  Latrelle Dodrill, MD Pager: Text Page via [email or web address]   If 7PM-7AM, please contact night-coverage [email or web address] 10/23/2017, 2:41 PM   Note - This record has been created using [employer]. Chart creation errors have been sought, but may not always have been located. Such creation errors do not reflect on the standard of medical care.

## 2017-10-24 LAB — BASIC METABOLIC PANEL
ANION GAP: 10 (ref 5–15)
BUN: 27 mg/dL — AB (ref 6–20)
CO2: 28 mmol/L (ref 22–32)
Calcium: 8.5 mg/dL — ABNORMAL LOW (ref 8.9–10.3)
Chloride: 101 mmol/L (ref 101–111)
Creatinine, Ser: 1.3 mg/dL — ABNORMAL HIGH (ref 0.44–1.00)
GFR calc Af Amer: 41 mL/min — ABNORMAL LOW (ref >60)
GFR calc non Af Amer: 35 mL/min — ABNORMAL LOW (ref >60)
GLUCOSE: 120 mg/dL — AB (ref 65–99)
POTASSIUM: 3.5 mmol/L (ref 3.5–5.1)
Sodium: 139 mmol/L (ref 135–145)

## 2017-10-24 LAB — GLUCOSE, CAPILLARY
GLUCOSE-CAPILLARY: 278 mg/dL — AB (ref 65–99)
Glucose-Capillary: 111 mg/dL — ABNORMAL HIGH (ref 65–99)
Glucose-Capillary: 129 mg/dL — ABNORMAL HIGH (ref 65–99)

## 2017-10-24 MED ORDER — AMOXICILLIN-POT CLAVULANATE 500-125 MG PO TABS: 1.0000 | ORAL_TABLET | Freq: Two times a day (BID) | ORAL | 0 refills | Status: AC

## 2017-10-24 NOTE — Progress Notes (Signed)
Pt alert, oriented, tolerating diet.  D/C instructions and prescriptions were given.  All questions were answered. Pt was d/cd home with spouse.

## 2017-10-24 NOTE — Progress Notes (Deleted)
Physician Discharge Summary  [place] M Schollmeyer  UJW:[id number]  DOB: [birth year]  DOA: 10/20/2017 PCP: Alysia Penna, MD  Admit date: 10/20/2017 Discharge date: 10/24/2017  Admitted From: Home  Disposition: Home   Recommendations for Outpatient Follow-up:  1. Follow up with PCP in 1-2 weeks 2. Please obtain BMP/CBC in one week to monitor Hgb and renal function  3. 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 intake.  Upon initial evaluation CT of the abdomen was suggestive of small bowel obstruction.  Surgical team was consulted and recommended conservative management with NG tube and IV fluids.  Small bowel protocol was performed and contrast found to be in the rectum, diet was advanced as tolerated.  During hospital stay patient was noted to be slightly hypoxic shakes x-ray was performed and noted to have pneumonia.  Patient was started on Augmentin and subsequently weaned off oxygen.  Patient has clinical improvement now is tolerating regular diet well, with no cough or difficulty breathing.  Patient  was deemed stable for discharge and follow-up with PCP  Subjective: Patient seen and examined, passing flatus and having bowel movement.  Denies chest pain, shortness of breath, abdominal pain, palpitations and dizziness.  Discharge Diagnoses/Hospital Course:  Small bowel obstruction - resolved Surgery was consulted, initially treated with NG tube and  IV fluid, small bowel protocol showed contrast in the rectum consistent with SBO resolution.  NG tube was removed and diet was advanced as tolerated.  Patient now tolerating regular diet with no history of passing flatus and having bowel movement.  Acute respiratory failure with hypoxia due to CAP Chest x-ray consistent with pneumonia, left lower lobe opacity, patient on Augmentin pro-calcitonin elevated.  Patient has been successfully weaned off oxygen.   Hypertension BP was stable during hospital stay Continue home regimen with no changes  Chronic kidney disease stage III No recent serum creatinine last recorded on 10/2016 it was 1.0 Creatinine stable during hospital stay at 1.3 with GFR of 35  Type 2 diabetes mellitus Diet controlled CBCs were stable during hospital stay  All other chronic medical condition were stable during the hospitalization.  On the day of the discharge the patient's vitals were stable, and no other acute medical condition were reported by patient. the patient was felt safe to be discharge to home   Discharge Instructions  You were cared for by a hospitalist during your hospital stay. If you have any questions about your discharge medications or the care you received while you were in the hospital after you are discharged, you can call the unit and asked to speak with the hospitalist on call if the hospitalist that took care of you is not available. Once you are discharged, your primary care physician will handle any further medical issues. Please note that NO REFILLS for any discharge medications will be authorized once you are discharged, as it is imperative that you return to your primary care physician (or establish a relationship with a primary care physician if you do  not have one) for your aftercare needs so that they can reassess your need for medications and monitor your lab values.  Discharge Instructions    Call MD for:  difficulty breathing, headache  or visual disturbances   Complete by:  As directed    Call MD for:  extreme fatigue   Complete by:  As directed    Call MD for:  hives   Complete by:  As directed    Call MD for:  persistant dizziness or light-headedness   Complete by:  As directed    Call MD for:  persistant nausea and vomiting   Complete by:  As directed    Call MD for:  redness, tenderness, or signs of infection (pain, swelling, redness, odor or green/yellow discharge around incision site)   Complete by:  As directed    Call MD for:  severe uncontrolled pain   Complete by:  As directed    Call MD for:  temperature >100.4   Complete by:  As directed    Diet - low sodium heart healthy   Complete by:  As directed    Increase activity slowly   Complete by:  As directed      Allergies as of 10/24/2017      Reactions   Crestor [rosuvastatin] Other (See Comments)   Myalgias   Lipitor [atorvastatin] Other (See Comments)   myalgias   Zocor [simvastatin] Other (See Comments)   myalgias      Medication List    STOP taking these medications   potassium chloride 10 MEQ tablet Commonly known as:  K-DUR   triamcinolone cream 0.1 % Commonly known as:  KENALOG     TAKE these medications   alendronate 70 MG tablet Commonly known as:  FOSAMAX Take 70 mg by mouth every Monday.   amLODipine 10 MG tablet Commonly known as:  NORVASC Take 10 mg by mouth at bedtime.   amoxicillin-clavulanate 500-125 MG tablet Commonly known as:  AUGMENTIN Take 1 tablet (500 mg total) by mouth every 12 (twelve) hours for 5 days.   aspirin EC 81 MG tablet Take 81 mg by mouth at bedtime.   CALCIUM CITRATE + PO Take 1 tablet by mouth daily.   gabapentin 300 MG capsule Commonly known as:  NEURONTIN Take 300 mg by mouth at bedtime.   hydrALAZINE 25 MG tablet Commonly known as:  APRESOLINE Take 25 mg by mouth daily.      Follow-up Information    Alysia PennaHolwerda, Scott, MD. Schedule an appointment as soon as possible for a visit in  1 week(s).   Specialty:  Internal Medicine Why:  Hospital follow up  Contact information: [address] [place] [address] [phone]          Allergies  Allergen Reactions  . Crestor [Rosuvastatin] Other (See Comments)    Myalgias  . Lipitor [Atorvastatin] Other (See Comments)    myalgias  . Zocor [Simvastatin] Other (See Comments)    myalgias    Consultations:  General surgery   Procedures/Studies: Ct Abdomen Pelvis Wo Contrast  Result Date: 10/20/2017 CLINICAL DATA:  Abdominal pain EXAM: CT ABDOMEN AND PELVIS WITHOUT CONTRAST TECHNIQUE: Multidetector CT imaging of the abdomen and pelvis was performed following the standard protocol without IV contrast. COMPARISON:  Ultrasound 12/21/2016, CT 12/18/2016 FINDINGS: Lower chest: Lung bases demonstrate partial consolidation in the right middle lobe with minimal nodularity. No significant pleural effusion. Post sternotomy changes. Borderline cardiomegaly. Hepatobiliary: Large stones in the gallbladder with wall thickening present.  Gallbladder wall calcifications are also present. Punctate calcifications near the porta hepatis are unchanged. Pancreas: Unremarkable. No pancreatic ductal dilatation or surrounding inflammatory changes. Spleen: Normal in size without focal abnormality. Adrenals/Urinary Tract: Adrenal glands are within normal limits. No hydronephrosis. Previously noted hypodense lesion in the left kidney difficult to appreciate without contrast. Distended urinary bladder Stomach/Bowel: Marked enlargement of the stomach and proximal small bowel with dilated fluid-filled small bowel measuring up to 4.4 cm. Transition to decompressed small bowel in the left lower quadrant but poorly defined due to absence of oral contrast. No significant bowel wall thickening. Distal small bowel and colon are collapsed. Extensive sigmoid colon diverticula. Vascular/Lymphatic: Extensive aortic atherosclerosis. No aneurysmal dilatation. No  significantly enlarged lymph nodes. Reproductive: Status post hysterectomy. No adnexal masses. Other: No significant free air or free fluid. Previously noted coarse ventral calcification above the pubic symphysis is no longer visualized. Musculoskeletal: Grade 1 anterolisthesis L4 on L5. Degenerative changes of the spine. IMPRESSION: 1. Dilated fluid-filled stomach and proximal small bowel consistent with high-grade bowel obstruction, transition to decompressed mid to distal small bowel in the left lower quadrant, transition point poorly defined due to lack of enteral contrast. 2. Large stones in the gallbladder. Wall calcification consistent with porcelain gallbladder. Suspected gallbladder wall thickening as before. 3. Sigmoid colon diverticular disease without acute inflammation. Electronically Signed   By: Jasmine Pang M.D.   On: 10/20/2017 23:23   Dg Chest Port 1 View  Result Date: 10/22/2017 CLINICAL DATA:  84 year old female with shortness of breath. EXAM: PORTABLE CHEST 1 VIEW COMPARISON:  10/21/2017 and earlier. FINDINGS: Portable AP semi upright view at 1443 hours. Enteric tube has been removed. Stable cardiac size and mediastinal contours. Prior sternotomy. Calcified aortic atherosclerosis. Visualized tracheal air column is within normal limits. The right lung is clear allowing for portable technique. Persistent patchy opacity in the left mid and lower lung. No pneumothorax or definite pleural effusion. Partially visible retained oral contrast in bowel loops in the abdomen. IMPRESSION: 1. Patchy nonspecific left mid and lower lung opacity stable since yesterday but new since 10/20/2017. No definite pleural effusion. 2. The right lung remains clear. 3. Enteric tube removed. Electronically Signed   By: Odessa Fleming M.D.   On: 10/22/2017 15:28   Dg Chest Port 1 View  Result Date: 10/21/2017 CLINICAL DATA:  Increased breath sounds EXAM: PORTABLE CHEST 1 VIEW COMPARISON:  12/21/2016 FINDINGS: Cardiac  shadow is mildly enlarged. Postsurgical changes are again noted. Nasogastric catheter is seen within the stomach. Changes of prior arterial stenting likely in the left subclavian artery are seen. Some patchy changes are noted in the left lung base and left mid lung increased from the prior exam. No bony abnormality is seen. IMPRESSION: Patchy infiltrates in the left mid and lower lung. Electronically Signed   By: Alcide Clever M.D.   On: 10/21/2017 09:59   Dg Abd Portable 1v  Result Date: 10/22/2017 CLINICAL DATA:  Mid and lower abdominal discomfort today. History of partial small bowel resection and May 2018 EXAM: PORTABLE ABDOMEN - 1 VIEW COMPARISON:  Portable AP view of the abdomen of October 21, 2017 FINDINGS: The previously administered oral contrast has traversed the small bowel and reached the rectum. No significant residual contrast is noted within the small bowel. No fluid filled non contrasted small-bowel loops are demonstrated. There is some gas within a small bowel loop to the left of midline. There is surgical suture material in the right lower quadrant. IMPRESSION: No evidence of small  bowel obstruction. Electronically Signed   By: David  [place] M.D.   On: 10/22/2017 10:07   Dg Abd Portable 1v-small Bowel Obstruction Protocol-initial, 8 Hr Delay  Result Date: 10/21/2017 CLINICAL DATA:  Small-bowel obstruction EXAM: PORTABLE ABDOMEN - 1 VIEW COMPARISON:  Portable exam 1757 hours compared to 0901 hours FINDINGS: Small amount of retained radiodense material within the stomach and duodenum. Diluted contrast opacifies dilated small bowel loops in the LEFT mid abdomen. Anastomotic staple line in RIGHT mid abdomen. No definite bowel wall thickening. Bones demineralized. IMPRESSION: Opacified distended small bowel loops in LEFT mid abdomen. Retained radiodense material within the stomach and duodenum. Electronically Signed   By: Ulyses Southward M.D.   On: 10/21/2017 18:40   Dg Abd Portable 1v  Result  Date: 10/21/2017 CLINICAL DATA:  Nasogastric tube placement EXAM: PORTABLE ABDOMEN - 1 VIEW COMPARISON:  CT abdomen and pelvis October 20, 2017 FINDINGS: Nasogastric tube tip and side port are in the stomach. There are loops of dilated small bowel suggesting a degree of bowel obstruction. No free air. There is atelectatic change in the left lung base. IMPRESSION: Nasogastric tube tip and side port in stomach. Visualized bowel gas shows evidence of a degree of bowel obstruction. No free air evident. Electronically Signed   By: Bretta Bang III M.D.   On: 10/21/2017 09:23    Discharge Exam: Vitals:   10/24/17 0416 10/24/17 1029  BP: (!) 146/57 (!) 142/65  Pulse: 77 77  Resp: 18   Temp: 98.6 F (37 C)   SpO2: 96%    Vitals:   10/23/17 1325 10/23/17 2005 10/24/17 0416 10/24/17 1029  BP: (!) 170/59 (!) 169/68 (!) 146/57 (!) 142/65  Pulse: 69 80 77 77  Resp: 16 18 18    Temp: 98.2 F (36.8 C) 98.8 F (37.1 C) 98.6 F (37 C)   TempSrc: Oral Oral Oral   SpO2: 90% 93% 96%   Weight:      Height:        General: Pt is alert, awake, not in acute distress Cardiovascular: RRR, S1/S2 +, no rubs, no gallops Respiratory: CTA bilaterally, no wheezing, no rhonchi Abdominal: Soft, NT, ND, bowel sounds + Extremities: no edema, no cyanosis   The results of significant diagnostics from this hospitalization (including imaging, microbiology, ancillary and laboratory) are listed below for reference.     Microbiology: No results found for this or any previous visit (from the past 240 hour(s)).   Labs: BNP (last 3 results) No results for input(s): BNP in the last 8760 hours. Basic Metabolic Panel: Recent Labs  Lab 10/20/17 1937 10/21/17 0354 10/22/17 0452 10/23/17 0510 10/24/17 0412  NA 141 143 147* 146* 139  K 4.0 4.1 3.2* 3.0* 3.5  CL 102 103 104 104 101  CO2 26 27 30 30 28   GLUCOSE 233* 176* 135* 102* 120*  BUN 27* 32* 35* 29* 27*  CREATININE 1.43* 1.48* 1.54* 1.36* 1.30*  CALCIUM  10.3 10.0 8.9 8.6* 8.5*  MG  --   --   --  1.5*  --    Liver Function Tests: Recent Labs  Lab 10/20/17 1937  AST 18  ALT 11*  ALKPHOS 78  BILITOT 1.2  PROT 7.6  ALBUMIN 4.1   Recent Labs  Lab 10/20/17 1937  LIPASE 26   No results for input(s): AMMONIA in the last 168 hours. CBC: Recent Labs  Lab 10/20/17 2025 10/21/17 0354 10/22/17 0452 10/23/17 0510  WBC 17.2* 14.2* 11.8* 10.9*  NEUTROABS  --   --  8.9* 8.2*  HGB 11.1* 11.4* 9.3* 9.2*  HCT 35.2* 35.8* 30.1* 29.7*  MCV 81.1 80.8 83.4 83.4  PLT 173 156 149* 144*   Cardiac Enzymes: Recent Labs  Lab 10/20/17 1944  TROPONINI <0.03   BNP: Invalid input(s): POCBNP CBG: Recent Labs  Lab 10/23/17 1956 10/23/17 2319 10/24/17 0415 10/24/17 0751 10/24/17 1145  GLUCAP 213* 134* 111* 129* 278*   D-Dimer No results for input(s): DDIMER in the last 72 hours. Hgb A1c No results for input(s): HGBA1C in the last 72 hours. Lipid Profile No results for input(s): CHOL, HDL, LDLCALC, TRIG, CHOLHDL, LDLDIRECT in the last 72 hours. Thyroid function studies No results for input(s): TSH, T4TOTAL, T3FREE, THYROIDAB in the last 72 hours.  Invalid input(s): FREET3 Anemia work up No results for input(s): VITAMINB12, FOLATE, FERRITIN, TIBC, IRON, RETICCTPCT in the last 72 hours. Urinalysis    Component Value Date/Time   COLORURINE YELLOW 12/17/2016 2320   APPEARANCEUR HAZY (A) 12/17/2016 2320   LABSPEC 1.016 12/17/2016 2320   PHURINE 5.0 12/17/2016 2320   GLUCOSEU NEGATIVE 12/17/2016 2320   HGBUR SMALL (A) 12/17/2016 2320   BILIRUBINUR NEGATIVE 12/17/2016 2320   KETONESUR NEGATIVE 12/17/2016 2320   PROTEINUR 100 (A) 12/17/2016 2320   UROBILINOGEN 0.2 06/03/2013 1938   NITRITE NEGATIVE 12/17/2016 2320   LEUKOCYTESUR NEGATIVE 12/17/2016 2320   Sepsis Labs Invalid input(s): PROCALCITONIN,  WBC,  LACTICIDVEN Microbiology No results found for this or any previous visit (from the past 240 hour(s)).   Time coordinating  discharge: 32 minutes  SIGNED:  Latrelle Dodrill, MD  [facility] 10/24/2017, 12:50 PM  Pager please text page via  [email or web address]  Note - This record has been created using [employer]. Chart creation errors have been sought, but may not always have been located. Such creation errors do not reflect on the standard of medical care.

## 2017-10-24 NOTE — Progress Notes (Signed)
[facility] Progress Note     Subjective: CC:  Sitting up in chair.  Denies abdominal pain.  Denies fever, chills, nausea, vomiting.  Urinating without hesitancy.  Mobilizing independently. Objective: Vital signs in last 24 hours: Temp:  [98.2 F (36.8 C)-98.8 F (37.1 C)] 98.6 F (37 C) (03/22 0416) Pulse Rate:  [69-80] 77 (03/22 0416) Resp:  [16-18] 18 (03/22 0416) BP: (146-170)/(57-68) 146/57 (03/22 0416) SpO2:  [90 %-96 %] 96 % (03/22 0416) Last BM Date: 10/23/17  Intake/Output from previous day: 03/21 0701 - 03/22 0700 In: 1220 [P.O.:720; I.V.:300; IV Piggyback:200] Out: 300 [Urine:300] Intake/Output this shift: No intake/output data recorded.  PE: Gen:  Alert, NAD, pleasant and cooperative Card:  Regular rate and rhythm, no lower extremity edema Pulm:  Normal effort, clear to auscultation bilaterally   Abd: Soft, non-tender, non-distended, bowel sounds present in all 4 quadrants, previous surgical incision noted Skin: warm and dry, no rashes  Psych: A&Ox3   Lab Results:  Recent Labs    10/22/17 0452 10/23/17 0510  WBC 11.8* 10.9*  HGB 9.3* 9.2*  HCT 30.1* 29.7*  PLT 149* 144*   BMET Recent Labs    10/23/17 0510 10/24/17 0412  NA 146* 139  K 3.0* 3.5  CL 104 101  CO2 30 28  GLUCOSE 102* 120*  BUN 29* 27*  CREATININE 1.36* 1.30*  CALCIUM 8.6* 8.5*   PT/INR No results for input(s): LABPROT, INR in the last 72 hours. CMP     Component Value Date/Time   NA 139 10/24/2017 0412   K 3.5 10/24/2017 0412   CL 101 10/24/2017 0412   CO2 28 10/24/2017 0412   GLUCOSE 120 (H) 10/24/2017 0412   BUN 27 (H) 10/24/2017 0412   CREATININE 1.30 (H) 10/24/2017 0412   CALCIUM 8.5 (L) 10/24/2017 0412   PROT 7.6 10/20/2017 1937   ALBUMIN 4.1 10/20/2017 1937   AST 18 10/20/2017 1937   ALT 11 (L) 10/20/2017 1937   ALKPHOS 78 10/20/2017 1937   BILITOT 1.2 10/20/2017 1937   GFRNONAA 35 (L) 10/24/2017 0412   GFRAA 41 (L) 10/24/2017 0412   Lipase    Component Value Date/Time   LIPASE 26 10/20/2017 1937       Studies/Results: Dg Chest Port 1 View  Result Date: 10/22/2017 CLINICAL DATA:  84 year old female with shortness of breath. EXAM: PORTABLE CHEST 1 VIEW COMPARISON:  10/21/2017 and earlier. FINDINGS: Portable AP semi upright view at 1443 hours. Enteric tube has been removed. Stable cardiac size and mediastinal contours. Prior sternotomy. Calcified aortic atherosclerosis. Visualized tracheal air column is within normal limits. The right lung is clear allowing for portable technique. Persistent patchy opacity in the left mid and lower lung. No pneumothorax or definite pleural effusion. Partially visible retained oral contrast in bowel loops in the abdomen. IMPRESSION: 1. Patchy nonspecific left mid and lower lung opacity stable since yesterday but new since 10/20/2017. No definite pleural effusion. 2. The right lung remains clear. 3. Enteric tube removed. Electronically Signed   By: Odessa FlemingH  Hall M.D.   On: 10/22/2017 15:28   Dg Abd Portable 1v  Result Date: 10/22/2017 CLINICAL DATA:  Mid and lower abdominal discomfort today. History of partial small bowel resection and May 2018 EXAM: PORTABLE ABDOMEN - 1 VIEW COMPARISON:  Portable AP view of the abdomen of October 21, 2017 FINDINGS: The previously administered oral contrast has traversed the small bowel and reached the rectum. No significant residual contrast is noted within the small bowel. No  fluid filled non contrasted small-bowel loops are demonstrated. There is some gas within a small bowel loop to the left of midline. There is surgical suture material in the right lower quadrant. IMPRESSION: No evidence of small bowel obstruction. Electronically Signed   By: David  [place] M.D.   On: 10/22/2017 10:07    Anti-infectives: Anti-infectives (From admission, onward)   Start     Dose/Rate Route Frequency Ordered Stop   10/22/17 1700  amoxicillin-clavulanate (AUGMENTIN) 500-125 MG per tablet 500 mg      1 tablet Oral Every 12 hours 10/22/17 1546         Assessment/Plan Recurrent small bowel obstruction  SBO has resolved without operative intervention.  Patient is having bowel movements and tolerating oral intake.  From a surgical standpoint the patient is stable for discharge.  Surgical follow-up is not required.  General surgery will sign off, call with questions or concerns.   LOS: 3 days    Adam Phenix , [facility] 10/24/2017, 9:08 AM Pager: [phone] Consults: [phone] Mon-Fri 7:00 am-4:30 pm Sat-Sun 7:00 am-11:30 am

## 2017-10-24 NOTE — Progress Notes (Signed)
Results for [place]ias, [place] M (MRN [id number]) as of 10/24/2017 13:53  Ref. Range 10/23/2017 19:56 10/23/2017 23:19 10/24/2017 04:15 10/24/2017 07:51 10/24/2017 11:45  Glucose-Capillary Latest Ref Range: 65 - 99 mg/dL 782213 (H) 956134 (H) 213111 (H) 129 (H) 278 (H)  Recommend changing Novolog SENSITIVE correction scale to TID & HS since patient is eating.   Smith MinceKendra Deleah Tison RN BSN CDE Diabetes Coordinator Pager: [phone]  8am-5pm

## 2017-10-24 NOTE — Discharge Summary (Signed)
Physician Discharge Summary  [place] M Grigorian  RUE:[id number]  DOB: [birth year]  DOA: 10/20/2017 PCP: Alysia Penna, MD  Admit date: 10/20/2017 Discharge date: 10/24/2017  Admitted From: Home  Disposition: Home   Recommendations for Outpatient Follow-up:  1. Follow up with PCP in 1-2 weeks 2. Please obtain BMP/CBC in one week to monitor Hgb and renal function  3. 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 intake.  Upon initial evaluation CT of the abdomen was suggestive of small bowel obstruction.  Surgical team was consulted and recommended conservative management with NG tube and IV fluids.  Small bowel protocol was performed and contrast found to be in the rectum, diet was advanced as tolerated.  During hospital stay patient was noted to be slightly hypoxic shakes x-ray was performed and noted to have pneumonia.  Patient was started on Augmentin and subsequently weaned off oxygen.  Patient has clinical improvement now is tolerating regular diet well, with no cough or difficulty breathing.  Patient  was deemed stable for discharge and follow-up with PCP  Subjective: Patient seen and examined, passing flatus and having bowel movement.  Denies chest pain, shortness of breath, abdominal pain, palpitations and dizziness.  Discharge Diagnoses/Hospital Course:  Small bowel obstruction - resolved Surgery was consulted, initially treated with NG tube and  IV fluid, small bowel protocol showed contrast in the rectum consistent with SBO resolution.  NG tube was removed and diet was advanced as tolerated.  Patient now tolerating regular diet with no history of passing flatus and having bowel movement.  Acute respiratory failure with hypoxia due to CAP Chest x-ray consistent with pneumonia, left lower lobe opacity, patient on Augmentin pro-calcitonin elevated.  Patient has been successfully weaned off oxygen.   Hypertension BP was stable during hospital stay Continue home regimen with no changes  Chronic kidney disease stage III No recent serum creatinine last recorded on 10/2016 it was 1.0 Creatinine stable during hospital stay at 1.3 with GFR of 35  Type 2 diabetes mellitus Diet controlled CBCs were stable during hospital stay  All other chronic medical condition were stable during the hospitalization.  On the day of the discharge the patient's vitals were stable, and no other acute medical condition were reported by patient. the patient was felt safe to be discharge to home   Discharge Instructions  You were cared for by a hospitalist during your hospital stay. If you have any questions about your discharge medications or the care you received while you were in the hospital after you are discharged, you can call the unit and asked to speak with the hospitalist on call if the hospitalist that took care of you is not available. Once you are discharged, your primary care physician will handle any further medical issues. Please note that NO REFILLS for any discharge medications will be authorized once you are discharged, as it is imperative that you return to your primary care physician (or establish a relationship with a primary care physician if you do  not have one) for your aftercare needs so that they can reassess your need for medications and monitor your lab values.  Discharge Instructions    Call MD for:  difficulty breathing, headache  or visual disturbances   Complete by:  As directed    Call MD for:  extreme fatigue   Complete by:  As directed    Call MD for:  hives   Complete by:  As directed    Call MD for:  persistant dizziness or light-headedness   Complete by:  As directed    Call MD for:  persistant nausea and vomiting   Complete by:  As directed    Call MD for:  redness, tenderness, or signs of infection (pain, swelling, redness, odor or green/yellow discharge around incision site)   Complete by:  As directed    Call MD for:  severe uncontrolled pain   Complete by:  As directed    Call MD for:  temperature >100.4   Complete by:  As directed    Diet - low sodium heart healthy   Complete by:  As directed    Increase activity slowly   Complete by:  As directed      Allergies as of 10/24/2017      Reactions   Crestor [rosuvastatin] Other (See Comments)   Myalgias   Lipitor [atorvastatin] Other (See Comments)   myalgias   Zocor [simvastatin] Other (See Comments)   myalgias      Medication List    STOP taking these medications   potassium chloride 10 MEQ tablet Commonly known as:  K-DUR   triamcinolone cream 0.1 % Commonly known as:  KENALOG     TAKE these medications   alendronate 70 MG tablet Commonly known as:  FOSAMAX Take 70 mg by mouth every Monday.   amLODipine 10 MG tablet Commonly known as:  NORVASC Take 10 mg by mouth at bedtime.   amoxicillin-clavulanate 500-125 MG tablet Commonly known as:  AUGMENTIN Take 1 tablet (500 mg total) by mouth every 12 (twelve) hours for 5 days.   aspirin EC 81 MG tablet Take 81 mg by mouth at bedtime.   CALCIUM CITRATE + PO Take 1 tablet by mouth daily.   gabapentin 300 MG capsule Commonly known as:  NEURONTIN Take 300 mg by mouth at bedtime.   hydrALAZINE 25 MG tablet Commonly known as:  APRESOLINE Take 25 mg by mouth daily.      Follow-up Information    Alysia PennaHolwerda, Scott, MD. Schedule an appointment as soon as possible for a visit in  1 week(s).   Specialty:  Internal Medicine Why:  Hospital follow up  Contact information: [address] [place] [address] [phone]          Allergies  Allergen Reactions  . Crestor [Rosuvastatin] Other (See Comments)    Myalgias  . Lipitor [Atorvastatin] Other (See Comments)    myalgias  . Zocor [Simvastatin] Other (See Comments)    myalgias    Consultations:  General surgery   Procedures/Studies: Ct Abdomen Pelvis Wo Contrast  Result Date: 10/20/2017 CLINICAL DATA:  Abdominal pain EXAM: CT ABDOMEN AND PELVIS WITHOUT CONTRAST TECHNIQUE: Multidetector CT imaging of the abdomen and pelvis was performed following the standard protocol without IV contrast. COMPARISON:  Ultrasound 12/21/2016, CT 12/18/2016 FINDINGS: Lower chest: Lung bases demonstrate partial consolidation in the right middle lobe with minimal nodularity. No significant pleural effusion. Post sternotomy changes. Borderline cardiomegaly. Hepatobiliary: Large stones in the gallbladder with wall thickening present.  Gallbladder wall calcifications are also present. Punctate calcifications near the porta hepatis are unchanged. Pancreas: Unremarkable. No pancreatic ductal dilatation or surrounding inflammatory changes. Spleen: Normal in size without focal abnormality. Adrenals/Urinary Tract: Adrenal glands are within normal limits. No hydronephrosis. Previously noted hypodense lesion in the left kidney difficult to appreciate without contrast. Distended urinary bladder Stomach/Bowel: Marked enlargement of the stomach and proximal small bowel with dilated fluid-filled small bowel measuring up to 4.4 cm. Transition to decompressed small bowel in the left lower quadrant but poorly defined due to absence of oral contrast. No significant bowel wall thickening. Distal small bowel and colon are collapsed. Extensive sigmoid colon diverticula. Vascular/Lymphatic: Extensive aortic atherosclerosis. No aneurysmal dilatation. No  significantly enlarged lymph nodes. Reproductive: Status post hysterectomy. No adnexal masses. Other: No significant free air or free fluid. Previously noted coarse ventral calcification above the pubic symphysis is no longer visualized. Musculoskeletal: Grade 1 anterolisthesis L4 on L5. Degenerative changes of the spine. IMPRESSION: 1. Dilated fluid-filled stomach and proximal small bowel consistent with high-grade bowel obstruction, transition to decompressed mid to distal small bowel in the left lower quadrant, transition point poorly defined due to lack of enteral contrast. 2. Large stones in the gallbladder. Wall calcification consistent with porcelain gallbladder. Suspected gallbladder wall thickening as before. 3. Sigmoid colon diverticular disease without acute inflammation. Electronically Signed   By: Jasmine Pang M.D.   On: 10/20/2017 23:23   Dg Chest Port 1 View  Result Date: 10/22/2017 CLINICAL DATA:  84 year old female with shortness of breath. EXAM: PORTABLE CHEST 1 VIEW COMPARISON:  10/21/2017 and earlier. FINDINGS: Portable AP semi upright view at 1443 hours. Enteric tube has been removed. Stable cardiac size and mediastinal contours. Prior sternotomy. Calcified aortic atherosclerosis. Visualized tracheal air column is within normal limits. The right lung is clear allowing for portable technique. Persistent patchy opacity in the left mid and lower lung. No pneumothorax or definite pleural effusion. Partially visible retained oral contrast in bowel loops in the abdomen. IMPRESSION: 1. Patchy nonspecific left mid and lower lung opacity stable since yesterday but new since 10/20/2017. No definite pleural effusion. 2. The right lung remains clear. 3. Enteric tube removed. Electronically Signed   By: Odessa Fleming M.D.   On: 10/22/2017 15:28   Dg Chest Port 1 View  Result Date: 10/21/2017 CLINICAL DATA:  Increased breath sounds EXAM: PORTABLE CHEST 1 VIEW COMPARISON:  12/21/2016 FINDINGS: Cardiac  shadow is mildly enlarged. Postsurgical changes are again noted. Nasogastric catheter is seen within the stomach. Changes of prior arterial stenting likely in the left subclavian artery are seen. Some patchy changes are noted in the left lung base and left mid lung increased from the prior exam. No bony abnormality is seen. IMPRESSION: Patchy infiltrates in the left mid and lower lung. Electronically Signed   By: Alcide Clever M.D.   On: 10/21/2017 09:59   Dg Abd Portable 1v  Result Date: 10/22/2017 CLINICAL DATA:  Mid and lower abdominal discomfort today. History of partial small bowel resection and May 2018 EXAM: PORTABLE ABDOMEN - 1 VIEW COMPARISON:  Portable AP view of the abdomen of October 21, 2017 FINDINGS: The previously administered oral contrast has traversed the small bowel and reached the rectum. No significant residual contrast is noted within the small bowel. No fluid filled non contrasted small-bowel loops are demonstrated. There is some gas within a small bowel loop to the left of midline. There is surgical suture material in the right lower quadrant. IMPRESSION: No evidence of small  bowel obstruction. Electronically Signed   By: David  [place] M.D.   On: 10/22/2017 10:07   Dg Abd Portable 1v-small Bowel Obstruction Protocol-initial, 8 Hr Delay  Result Date: 10/21/2017 CLINICAL DATA:  Small-bowel obstruction EXAM: PORTABLE ABDOMEN - 1 VIEW COMPARISON:  Portable exam 1757 hours compared to 0901 hours FINDINGS: Small amount of retained radiodense material within the stomach and duodenum. Diluted contrast opacifies dilated small bowel loops in the LEFT mid abdomen. Anastomotic staple line in RIGHT mid abdomen. No definite bowel wall thickening. Bones demineralized. IMPRESSION: Opacified distended small bowel loops in LEFT mid abdomen. Retained radiodense material within the stomach and duodenum. Electronically Signed   By: Ulyses Southward M.D.   On: 10/21/2017 18:40   Dg Abd Portable 1v  Result  Date: 10/21/2017 CLINICAL DATA:  Nasogastric tube placement EXAM: PORTABLE ABDOMEN - 1 VIEW COMPARISON:  CT abdomen and pelvis October 20, 2017 FINDINGS: Nasogastric tube tip and side port are in the stomach. There are loops of dilated small bowel suggesting a degree of bowel obstruction. No free air. There is atelectatic change in the left lung base. IMPRESSION: Nasogastric tube tip and side port in stomach. Visualized bowel gas shows evidence of a degree of bowel obstruction. No free air evident. Electronically Signed   By: Bretta Bang III M.D.   On: 10/21/2017 09:23    Discharge Exam: Vitals:   10/24/17 0416 10/24/17 1029  BP: (!) 146/57 (!) 142/65  Pulse: 77 77  Resp: 18   Temp: 98.6 F (37 C)   SpO2: 96%    Vitals:   10/23/17 1325 10/23/17 2005 10/24/17 0416 10/24/17 1029  BP: (!) 170/59 (!) 169/68 (!) 146/57 (!) 142/65  Pulse: 69 80 77 77  Resp: 16 18 18    Temp: 98.2 F (36.8 C) 98.8 F (37.1 C) 98.6 F (37 C)   TempSrc: Oral Oral Oral   SpO2: 90% 93% 96%   Weight:      Height:        General: Pt is alert, awake, not in acute distress Cardiovascular: RRR, S1/S2 +, no rubs, no gallops Respiratory: CTA bilaterally, no wheezing, no rhonchi Abdominal: Soft, NT, ND, bowel sounds + Extremities: no edema, no cyanosis   The results of significant diagnostics from this hospitalization (including imaging, microbiology, ancillary and laboratory) are listed below for reference.     Microbiology: No results found for this or any previous visit (from the past 240 hour(s)).   Labs: BNP (last 3 results) No results for input(s): BNP in the last 8760 hours. Basic Metabolic Panel: Recent Labs  Lab 10/20/17 1937 10/21/17 0354 10/22/17 0452 10/23/17 0510 10/24/17 0412  NA 141 143 147* 146* 139  K 4.0 4.1 3.2* 3.0* 3.5  CL 102 103 104 104 101  CO2 26 27 30 30 28   GLUCOSE 233* 176* 135* 102* 120*  BUN 27* 32* 35* 29* 27*  CREATININE 1.43* 1.48* 1.54* 1.36* 1.30*  CALCIUM  10.3 10.0 8.9 8.6* 8.5*  MG  --   --   --  1.5*  --    Liver Function Tests: Recent Labs  Lab 10/20/17 1937  AST 18  ALT 11*  ALKPHOS 78  BILITOT 1.2  PROT 7.6  ALBUMIN 4.1   Recent Labs  Lab 10/20/17 1937  LIPASE 26   No results for input(s): AMMONIA in the last 168 hours. CBC: Recent Labs  Lab 10/20/17 2025 10/21/17 0354 10/22/17 0452 10/23/17 0510  WBC 17.2* 14.2* 11.8* 10.9*  NEUTROABS  --   --  8.9* 8.2*  HGB 11.1* 11.4* 9.3* 9.2*  HCT 35.2* 35.8* 30.1* 29.7*  MCV 81.1 80.8 83.4 83.4  PLT 173 156 149* 144*   Cardiac Enzymes: Recent Labs  Lab 10/20/17 1944  TROPONINI <0.03   BNP: Invalid input(s): POCBNP CBG: Recent Labs  Lab 10/23/17 1956 10/23/17 2319 10/24/17 0415 10/24/17 0751 10/24/17 1145  GLUCAP 213* 134* 111* 129* 278*   D-Dimer No results for input(s): DDIMER in the last 72 hours. Hgb A1c No results for input(s): HGBA1C in the last 72 hours. Lipid Profile No results for input(s): CHOL, HDL, LDLCALC, TRIG, CHOLHDL, LDLDIRECT in the last 72 hours. Thyroid function studies No results for input(s): TSH, T4TOTAL, T3FREE, THYROIDAB in the last 72 hours.  Invalid input(s): FREET3 Anemia work up No results for input(s): VITAMINB12, FOLATE, FERRITIN, TIBC, IRON, RETICCTPCT in the last 72 hours. Urinalysis    Component Value Date/Time   COLORURINE YELLOW 12/17/2016 2320   APPEARANCEUR HAZY (A) 12/17/2016 2320   LABSPEC 1.016 12/17/2016 2320   PHURINE 5.0 12/17/2016 2320   GLUCOSEU NEGATIVE 12/17/2016 2320   HGBUR SMALL (A) 12/17/2016 2320   BILIRUBINUR NEGATIVE 12/17/2016 2320   KETONESUR NEGATIVE 12/17/2016 2320   PROTEINUR 100 (A) 12/17/2016 2320   UROBILINOGEN 0.2 06/03/2013 1938   NITRITE NEGATIVE 12/17/2016 2320   LEUKOCYTESUR NEGATIVE 12/17/2016 2320   Sepsis Labs Invalid input(s): PROCALCITONIN,  WBC,  LACTICIDVEN Microbiology No results found for this or any previous visit (from the past 240 hour(s)).   Time coordinating  discharge: 32 minutes  SIGNED:  Latrelle DodrillEdwin Silva, MD  [facility] 10/24/2017, 1:23 PM  Pager please text page via  [email or web address]  Note - This record has been created using [employer] software. Chart creation errors have been sought, but may not always have been located. Such creation errors do not reflect on the standard of medical care.

## 2017-11-03 DIAGNOSIS — J181 Lobar pneumonia, unspecified organism: Secondary | ICD-10-CM | POA: Diagnosis not present

## 2017-11-03 DIAGNOSIS — E119 Type 2 diabetes mellitus without complications: Secondary | ICD-10-CM | POA: Diagnosis not present

## 2017-11-03 DIAGNOSIS — K56609 Unspecified intestinal obstruction, unspecified as to partial versus complete obstruction: Secondary | ICD-10-CM | POA: Diagnosis not present

## 2017-11-03 DIAGNOSIS — I10 Essential (primary) hypertension: Secondary | ICD-10-CM | POA: Diagnosis not present

## 2017-11-03 DIAGNOSIS — J9600 Acute respiratory failure, unspecified whether with hypoxia or hypercapnia: Secondary | ICD-10-CM | POA: Diagnosis not present

## 2017-11-03 DIAGNOSIS — Z6824 Body mass index (BMI) 24.0-24.9, adult: Secondary | ICD-10-CM | POA: Diagnosis not present

## 2017-11-03 DIAGNOSIS — N183 Chronic kidney disease, stage 3 (moderate): Secondary | ICD-10-CM | POA: Diagnosis not present

## 2017-12-28 ENCOUNTER — Encounter (HOSPITAL_COMMUNITY): Payer: Self-pay

## 2017-12-28 ENCOUNTER — Emergency Department (HOSPITAL_COMMUNITY): Payer: Medicare Other

## 2017-12-28 ENCOUNTER — Other Ambulatory Visit: Payer: Self-pay

## 2017-12-28 ENCOUNTER — Emergency Department (HOSPITAL_COMMUNITY)
Admission: EM | Admit: 2017-12-28 | Discharge: 2017-12-28 | Disposition: A | Discharge status: Home / Self Care | Payer: Medicare Other | Attending: Emergency Medicine | Admitting: Emergency Medicine

## 2017-12-28 DIAGNOSIS — M7918 Myalgia, other site: Secondary | ICD-10-CM | POA: Diagnosis present

## 2017-12-28 DIAGNOSIS — E119 Type 2 diabetes mellitus without complications: Secondary | ICD-10-CM | POA: Diagnosis not present

## 2017-12-28 DIAGNOSIS — Z7982 Long term (current) use of aspirin: Secondary | ICD-10-CM | POA: Insufficient documentation

## 2017-12-28 DIAGNOSIS — R05 Cough: Secondary | ICD-10-CM | POA: Diagnosis not present

## 2017-12-28 DIAGNOSIS — N3000 Acute cystitis without hematuria: Secondary | ICD-10-CM | POA: Diagnosis not present

## 2017-12-28 DIAGNOSIS — Z951 Presence of aortocoronary bypass graft: Secondary | ICD-10-CM | POA: Diagnosis not present

## 2017-12-28 DIAGNOSIS — R300 Dysuria: Secondary | ICD-10-CM | POA: Diagnosis not present

## 2017-12-28 DIAGNOSIS — I10 Essential (primary) hypertension: Secondary | ICD-10-CM | POA: Insufficient documentation

## 2017-12-28 DIAGNOSIS — R52 Pain, unspecified: Secondary | ICD-10-CM

## 2017-12-28 DIAGNOSIS — M791 Myalgia, unspecified site: Secondary | ICD-10-CM | POA: Diagnosis not present

## 2017-12-28 DIAGNOSIS — I2510 Atherosclerotic heart disease of native coronary artery without angina pectoris: Secondary | ICD-10-CM | POA: Insufficient documentation

## 2017-12-28 DIAGNOSIS — I252 Old myocardial infarction: Secondary | ICD-10-CM | POA: Insufficient documentation

## 2017-12-28 LAB — CBC WITH DIFFERENTIAL/PLATELET
BASOS PCT: 0 %
Basophils Absolute: 0 10*3/uL (ref 0.0–0.1)
EOS ABS: 0 10*3/uL (ref 0.0–0.7)
EOS PCT: 0 %
HCT: 35 % — ABNORMAL LOW (ref 36.0–46.0)
HEMOGLOBIN: 11 g/dL — AB (ref 12.0–15.0)
LYMPHS ABS: 1.7 10*3/uL (ref 0.7–4.0)
Lymphocytes Relative: 31 %
MCH: 26.1 pg (ref 26.0–34.0)
MCHC: 31.4 g/dL (ref 30.0–36.0)
MCV: 82.9 fL (ref 78.0–100.0)
MONOS PCT: 8 %
Monocytes Absolute: 0.4 10*3/uL (ref 0.1–1.0)
NEUTROS PCT: 61 %
Neutro Abs: 3.4 10*3/uL (ref 1.7–7.7)
PLATELETS: 174 10*3/uL (ref 150–400)
RBC: 4.22 MIL/uL (ref 3.87–5.11)
RDW: 16.6 % — ABNORMAL HIGH (ref 11.5–15.5)
WBC: 5.5 10*3/uL (ref 4.0–10.5)

## 2017-12-28 LAB — COMPREHENSIVE METABOLIC PANEL
ALBUMIN: 4 g/dL (ref 3.5–5.0)
ALT: 10 U/L — ABNORMAL LOW (ref 14–54)
AST: 17 U/L (ref 15–41)
Alkaline Phosphatase: 64 U/L (ref 38–126)
Anion gap: 9 (ref 5–15)
BILIRUBIN TOTAL: 0.8 mg/dL (ref 0.3–1.2)
BUN: 22 mg/dL — AB (ref 6–20)
CO2: 24 mmol/L (ref 22–32)
Calcium: 9.3 mg/dL (ref 8.9–10.3)
Chloride: 109 mmol/L (ref 101–111)
Creatinine, Ser: 1.39 mg/dL — ABNORMAL HIGH (ref 0.44–1.00)
GFR calc Af Amer: 37 mL/min — ABNORMAL LOW (ref >60)
GFR calc non Af Amer: 32 mL/min — ABNORMAL LOW (ref >60)
GLUCOSE: 170 mg/dL — AB (ref 65–99)
POTASSIUM: 4.2 mmol/L (ref 3.5–5.1)
SODIUM: 142 mmol/L (ref 135–145)
TOTAL PROTEIN: 7.4 g/dL (ref 6.5–8.1)

## 2017-12-28 LAB — URINALYSIS, ROUTINE W REFLEX MICROSCOPIC
BILIRUBIN URINE: NEGATIVE
Bacteria, UA: NONE SEEN
Glucose, UA: NEGATIVE mg/dL
HGB URINE DIPSTICK: NEGATIVE
KETONES UR: NEGATIVE mg/dL
NITRITE: NEGATIVE
PH: 7 (ref 5.0–8.0)
Protein, ur: 100 mg/dL — AB
Specific Gravity, Urine: 1.011 (ref 1.005–1.030)

## 2017-12-28 LAB — CK: Total CK: 90 U/L (ref 38–234)

## 2017-12-28 MED ORDER — SODIUM CHLORIDE 0.9 % IV BOLUS
1000.0000 mL | Freq: Once | INTRAVENOUS | Status: AC
  Administered 2017-12-28: 1000 mL via INTRAVENOUS

## 2017-12-28 MED ORDER — SODIUM CHLORIDE 0.9 % IV SOLN
1.0000 g | Freq: Once | INTRAVENOUS | Status: AC
  Administered 2017-12-28: 1 g via INTRAVENOUS
  Filled 2017-12-28: qty 10

## 2017-12-28 MED ORDER — IBUPROFEN 200 MG PO TABS
400.0000 mg | ORAL_TABLET | Freq: Once | ORAL | Status: AC
  Administered 2017-12-28: 400 mg via ORAL
  Filled 2017-12-28: qty 2

## 2017-12-28 MED ORDER — CEPHALEXIN 500 MG PO CAPS: 500.0000 mg | ORAL_CAPSULE | Freq: Two times a day (BID) | ORAL | 0 refills | Status: DC

## 2017-12-28 NOTE — ED Provider Notes (Signed)
[facility]-EMERGENCY DEPT Provider Note   CSN: [id number] Arrival date & time: 12/28/17  1057     History   Chief Complaint Chief Complaint  Patient presents with  . Muscle Pain    HPI [place] PRIMROSE OLER is a 84 y.o. female history of CAD, hypercholesterol, hyperlipidemia here presenting with myalgias, dysuria.  Patient states that she has been having some diffuse muscle pain for the last several weeks.  She states that it comes and goes and she denies any fevers.  Does have some nonproductive cough as well as some urinary frequency.  Patient states that she has some mild dysuria occasionally.  She lives at home with her husband and she denies any sick contacts.  Patient was admitted several months ago for small bowel obstruction but denies any abdominal pain or vomiting and is still passing gas.   The history is provided by the patient.    Past Medical History:  Diagnosis Date  . Arthritis    "qwhere"  . Coronary artery disease   . Hypercholesteremia   . Hypertension   . Myocardial infarction (HCC) 2003  . SBO (small bowel obstruction) (HCC) 12/18/2016  . Stenosis of subclavian artery (HCC)   . Type II diabetes mellitus [facility])     Patient Active Problem List   Diagnosis Date Noted  . AKI (acute kidney injury) (HCC) 10/21/2017  . Delirium due to another medical condition 12/18/2016  . SBO (small bowel obstruction) (HCC) 12/18/2016  . 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 Leukocytosis 12/18/2016  . Peripheral artery disease (HCC) 11/08/2014  . PAD (peripheral artery disease) (HCC) 10/24/2014  . LBBB (left bundle branch block) 09/27/2014    Class: Chronic  . Subclavian artery stenosis, left (HCC) 09/27/2014  . Numbness and tingling in left hand 03/08/2013  . HTN (hypertension) 03/08/2013  . DM (diabetes mellitus) (HCC) 03/08/2013  . CAD (coronary artery disease) 03/08/2013    Past Surgical History:  Procedure Laterality Date  . ABDOMINAL  HYSTERECTOMY  1970  . BOWEL RESECTION N/A 12/20/2016   Procedure: SMALL BOWEL RESECTION;  Surgeon: Abigail Miyamoto, MD;  Location: [facility] OR;  Service: General;  Laterality: N/A;  . CARDIAC CATHETERIZATION    . CATARACT EXTRACTION Bilateral 2000's  . CORONARY ARTERY BYPASS GRAFT  2003   in [place], [place]  . LAPAROTOMY N/A 12/20/2016   Procedure: EXPLORATORY LAPAROTOMY;  Surgeon: Abigail Miyamoto, MD;  Location: [facility] OR;  Service: General;  Laterality: N/A;  . LEFT HEART CATHETERIZATION WITH CORONARY/GRAFT ANGIOGRAM N/A 09/27/2014   Procedure: LEFT HEART CATHETERIZATION WITH Isabel Caprice;  Surgeon: Pamella Pert, MD;  Location: [facility] CATH LAB;  Service: Cardiovascular;  Laterality: N/A;  . UNILATERAL UPPER EXTREMEITY ANGIOGRAM N/A 09/27/2014   Procedure: SUBCLAVIAN ARTERIOGRAM ;  Surgeon: Pamella Pert, MD;  Location: [facility] CATH LAB;  Service: Cardiovascular;  Laterality: N/A;  . UNILATERAL UPPER EXTREMEITY ANGIOGRAM N/A 11/09/2014   Procedure: Left subclavian stent ;  Surgeon: Yates Decamp, MD;  Location: [facility] CATH LAB;  Service: Cardiovascular;  Laterality: N/A;  . VISCERAL ANGIOGRAM N/A 10/25/2014   Procedure: Laurence Slate;  Surgeon: Yates Decamp, MD;  Location[facility] CATH LAB;  Service: Cardiovascular;  Laterality: N/A;     OB History   None      Home Medications    Prior to Admission medications   Medication Sig Start Date End Date Taking? Authorizing Provider  alendronate (FOSAMAX) 70 MG tablet Take 70 mg by mouth every Monday.  12/08/14   [provider]  amLODipine (NORVASC) 10 MG tablet Take 10 mg by mouth at bedtime.  10/12/14   [provider]  aspirin EC 81 MG tablet Take 81 mg by mouth at bedtime.     [provider]  Calcium Citrate-Vitamin D (CALCIUM CITRATE + PO) Take 1 tablet by mouth daily.     [provider]  gabapentin (NEURONTIN) 300 MG capsule Take 300 mg by mouth at bedtime.  04/09/17   [provider]  hydrALAZINE  (APRESOLINE) 25 MG tablet Take 25 mg by mouth daily.  03/21/17   [provider]    Family History Family History  Problem Relation Age of Onset  . Gallbladder disease Neg Hx     Social History Social History   Tobacco Use  . Smoking status: Never Smoker  . Smokeless tobacco: Never Used  Substance Use Topics  . Alcohol use: No  . Drug use: No     Allergies   Crestor [rosuvastatin]; Lipitor [atorvastatin]; and Zocor [simvastatin]   Review of Systems Review of Systems  Musculoskeletal: Positive for myalgias.  All other systems reviewed and are negative.    Physical Exam Updated Vital Signs BP (!) 161/54 (BP Location: Left Arm)   Pulse (!) 50   Temp 97.7 F (36.5 C)   Resp 14   Ht  (1.575 m)   Wt 59.4 kg (131 lb)   SpO2 97%   BMI 23.96 kg/m   Physical Exam  Constitutional: She is oriented to person, place, and time.  Chronically ill, slightly dehydrated   HENT:  Head: Normocephalic.  MM slightly dry   Eyes: Pupils are equal, round, and reactive to light. EOM are normal.  Neck: Normal range of motion. Neck supple.  No meningeal signs   Cardiovascular: Normal rate, regular rhythm and normal heart sounds.  Pulmonary/Chest: Effort normal and breath sounds normal. No stridor. No respiratory distress. She has no wheezes.  Abdominal: Soft. Bowel sounds are normal. She exhibits no distension. There is no tenderness. There is no guarding.  Mild suprapubic tenderness, no CVAT   Musculoskeletal: Normal range of motion.  Neurological: She is alert and oriented to person, place, and time. No cranial nerve deficit. Coordination normal.  Skin: Skin is warm.  Psychiatric: She has a normal mood and affect.  Nursing note and vitals reviewed.    ED Treatments / Results  Labs (all labs ordered are listed, but only abnormal results are displayed) Labs Reviewed  URINALYSIS, ROUTINE W REFLEX MICROSCOPIC - Abnormal; Notable for the following components:       Result Value   Protein, ur 100 (*)    Leukocytes, UA MODERATE (*)    All other components within normal limits  CBC WITH DIFFERENTIAL/PLATELET - Abnormal; Notable for the following components:   Hemoglobin 11.0 (*)    HCT 35.0 (*)    RDW 16.6 (*)    All other components within normal limits  COMPREHENSIVE METABOLIC PANEL - Abnormal; Notable for the following components:   Glucose, Bld 170 (*)    BUN 22 (*)    Creatinine, Ser 1.39 (*)    ALT 10 (*)    GFR calc non Af Amer 32 (*)    GFR calc Af Amer 37 (*)    All other components within normal limits  URINE CULTURE  CK    EKG None  Radiology Dg Chest 2 View  Result Date: 12/28/2017 CLINICAL DATA:  84 y/o female with 'pain to muscles/bones', frequent urination x1  week. Productive cough in the mornings for the past few weeks. Hx CAD, HTN, MI, DM, CABG EXAM: CHEST - 2 VIEW COMPARISON:  10/22/2017 FINDINGS: Stable changes from prior CABG surgery. Cardiac silhouette is mildly enlarged. No mediastinal or hilar masses. No convincing adenopathy. Clear lungs.  No pleural effusion or pneumothorax. Skeletal structures are intact. IMPRESSION: No acute cardiopulmonary disease. Electronically Signed   By: Amie Portland M.D.   On: 12/28/2017 12:48    Procedures Procedures (including critical care time)  Medications Ordered in ED Medications  ibuprofen (ADVIL,MOTRIN) tablet 400 mg (has no administration in time range)  sodium chloride 0.9 % bolus 1,000 mL (1,000 mLs Intravenous New Bag/Given 12/28/17 1302)  cefTRIAXone (ROCEPHIN) 1 g in sodium chloride 0.9 % 100 mL IVPB (1 g Intravenous New Bag/Given 12/28/17 1303)     Initial Impression / Assessment and Plan / ED Course  I have reviewed the triage vital signs and the nursing notes.  Pertinent labs & imaging results that were available during my care of the patient were reviewed by me and considered in my medical decision making (see chart for details).    [place] MIKAIYA TRAMBLE is a 84  y.o. female here with body aches, cough, dysuria for several weeks. Patient is afebrile in the ED. Well appearing for age. Doesn't appear septic. Will get labs, CXR, UA, CK to r/o rhabdo. Will give IVF and reassess.   2:02 PM CXR clear. WBC nl. UA + UTI and she has symptoms. Given rocephin. Also gave motrin and IVF and felt better. CK normal. Stable for discharge.     Final Clinical Impressions(s) / ED Diagnoses   Final diagnoses:  None    ED Discharge Orders    None       Charlynne Pander, MD 12/28/17 [phone]

## 2017-12-28 NOTE — Discharge Instructions (Signed)
Take keflex twice daily for a week for UTI.   Stay hydrated.   Take tylenol, motrin for muscle aches   See your doctor  Return to ER if you have trouble urinating, abdominal pain, vomiting, fever, cough.

## 2017-12-28 NOTE — ED Notes (Signed)
Pt currently in xray

## 2017-12-28 NOTE — ED Triage Notes (Addendum)
PT C/O ALL OVER MUSCLE PAIN, MAINLY ON THE RIGHT SIDE OF THE BODY COMING FROM HER BACK ALL THE WAY DOWN TO THE FOOT. PT ALSO STS FREQUENT URINATION X1 WEEK. DENIES FEVER, CHILLS, OR PAIN WHEN URINATING. PT ALSO DENIES INJURY OR TAKING DIURETICS. PT STS THE PAIN IS WORSE AT NIGHT AFTER SHE HAS WALKED A LOT DURING THE DAY.

## 2017-12-31 LAB — URINE CULTURE: Culture: 10000 — AB

## 2018-01-01 ENCOUNTER — Telehealth: Payer: Self-pay | Admitting: *Deleted

## 2018-01-01 DIAGNOSIS — N390 Urinary tract infection, site not specified: Secondary | ICD-10-CM | POA: Diagnosis not present

## 2018-01-01 DIAGNOSIS — N183 Chronic kidney disease, stage 3 (moderate): Secondary | ICD-10-CM | POA: Diagnosis not present

## 2018-01-01 NOTE — Telephone Encounter (Signed)
Post ED Visit - Positive Culture Follow-up: Unsuccessful Patient Follow-up  Culture assessed and recommendations reviewed by:   Enzo Bi, Pharm.D.  Celedonio Miyamoto, Pharm.D., BCPS AQ-ID  Garvin Fila, Pharm.D., BCPS  Georgina Pillion, Pharm.D., BCPS  [place], [address].D., BCPS, AAHIVP  Estella Husk, Pharm.D., BCPS, AAHIVP  Sherlynn Carbon, PharmD  Pollyann Samples, PharmD, BCPS  Positive urine culture   Patient discharged without antimicrobial prescription and treatment is now indicated  Organism is resistant to prescribed ED discharge antimicrobial  Patient with positive blood cultures  **Stop antibiotic therapy, Jodi Geralds, PA-C  Unable to contact patient after 3 attempts, letter will be sent to address on file  Sonya Chavez 01/01/2018, 10:25 AM

## 2018-01-05 ENCOUNTER — Telehealth: Payer: Self-pay | Admitting: *Deleted

## 2018-01-05 NOTE — Telephone Encounter (Signed)
Received call from patient in response to letter sent to address on file.  Advised to stop antibiotic treatment.  Verbalized understanding.

## 2018-01-13 DIAGNOSIS — M810 Age-related osteoporosis without current pathological fracture: Secondary | ICD-10-CM | POA: Diagnosis not present

## 2018-01-13 DIAGNOSIS — E7849 Other hyperlipidemia: Secondary | ICD-10-CM | POA: Diagnosis not present

## 2018-01-13 DIAGNOSIS — R82998 Other abnormal findings in urine: Secondary | ICD-10-CM | POA: Diagnosis not present

## 2018-01-13 DIAGNOSIS — N183 Chronic kidney disease, stage 3 (moderate): Secondary | ICD-10-CM | POA: Diagnosis not present

## 2018-01-13 DIAGNOSIS — E1140 Type 2 diabetes mellitus with diabetic neuropathy, unspecified: Secondary | ICD-10-CM | POA: Diagnosis not present

## 2018-01-19 DIAGNOSIS — E1140 Type 2 diabetes mellitus with diabetic neuropathy, unspecified: Secondary | ICD-10-CM | POA: Diagnosis not present

## 2018-01-19 DIAGNOSIS — Z6823 Body mass index (BMI) 23.0-23.9, adult: Secondary | ICD-10-CM | POA: Diagnosis not present

## 2018-01-19 DIAGNOSIS — R808 Other proteinuria: Secondary | ICD-10-CM | POA: Diagnosis not present

## 2018-01-19 DIAGNOSIS — I25118 Atherosclerotic heart disease of native coronary artery with other forms of angina pectoris: Secondary | ICD-10-CM | POA: Diagnosis not present

## 2018-01-19 DIAGNOSIS — Z0000 Encounter for general adult medical examination without abnormal findings: Secondary | ICD-10-CM | POA: Diagnosis not present

## 2018-01-19 DIAGNOSIS — I130 Hypertensive heart and chronic kidney disease with heart failure and stage 1 through stage 4 chronic kidney disease, or unspecified chronic kidney disease: Secondary | ICD-10-CM | POA: Diagnosis not present

## 2018-01-19 DIAGNOSIS — Z1389 Encounter for screening for other disorder: Secondary | ICD-10-CM | POA: Diagnosis not present

## 2018-01-19 DIAGNOSIS — I509 Heart failure, unspecified: Secondary | ICD-10-CM | POA: Diagnosis not present

## 2018-01-19 DIAGNOSIS — E7849 Other hyperlipidemia: Secondary | ICD-10-CM | POA: Diagnosis not present

## 2018-01-19 DIAGNOSIS — N183 Chronic kidney disease, stage 3 (moderate): Secondary | ICD-10-CM | POA: Diagnosis not present

## 2018-01-19 DIAGNOSIS — M810 Age-related osteoporosis without current pathological fracture: Secondary | ICD-10-CM | POA: Diagnosis not present

## 2018-01-19 DIAGNOSIS — M791 Myalgia, unspecified site: Secondary | ICD-10-CM | POA: Diagnosis not present

## 2018-01-28 DIAGNOSIS — I447 Left bundle-branch block, unspecified: Secondary | ICD-10-CM | POA: Diagnosis not present

## 2018-01-28 DIAGNOSIS — I6523 Occlusion and stenosis of bilateral carotid arteries: Secondary | ICD-10-CM | POA: Diagnosis not present

## 2018-01-28 DIAGNOSIS — I771 Stricture of artery: Secondary | ICD-10-CM | POA: Diagnosis not present

## 2018-01-28 DIAGNOSIS — I25119 Atherosclerotic heart disease of native coronary artery with unspecified angina pectoris: Secondary | ICD-10-CM | POA: Diagnosis not present

## 2018-02-02 DIAGNOSIS — I509 Heart failure, unspecified: Secondary | ICD-10-CM | POA: Diagnosis not present

## 2018-02-02 DIAGNOSIS — Z6825 Body mass index (BMI) 25.0-25.9, adult: Secondary | ICD-10-CM | POA: Diagnosis not present

## 2018-02-02 DIAGNOSIS — I130 Hypertensive heart and chronic kidney disease with heart failure and stage 1 through stage 4 chronic kidney disease, or unspecified chronic kidney disease: Secondary | ICD-10-CM | POA: Diagnosis not present

## 2018-02-02 DIAGNOSIS — M1990 Unspecified osteoarthritis, unspecified site: Secondary | ICD-10-CM | POA: Diagnosis not present

## 2018-02-02 DIAGNOSIS — E7849 Other hyperlipidemia: Secondary | ICD-10-CM | POA: Diagnosis not present

## 2018-02-02 DIAGNOSIS — N183 Chronic kidney disease, stage 3 (moderate): Secondary | ICD-10-CM | POA: Diagnosis not present

## 2018-02-02 DIAGNOSIS — E1140 Type 2 diabetes mellitus with diabetic neuropathy, unspecified: Secondary | ICD-10-CM | POA: Diagnosis not present

## 2018-03-10 DIAGNOSIS — I350 Nonrheumatic aortic (valve) stenosis: Secondary | ICD-10-CM | POA: Diagnosis not present

## 2018-04-22 DIAGNOSIS — M79604 Pain in right leg: Secondary | ICD-10-CM | POA: Diagnosis not present

## 2018-04-22 DIAGNOSIS — I25118 Atherosclerotic heart disease of native coronary artery with other forms of angina pectoris: Secondary | ICD-10-CM | POA: Diagnosis not present

## 2018-04-22 DIAGNOSIS — M79605 Pain in left leg: Secondary | ICD-10-CM | POA: Diagnosis not present

## 2018-04-22 DIAGNOSIS — Z23 Encounter for immunization: Secondary | ICD-10-CM | POA: Diagnosis not present

## 2018-04-22 DIAGNOSIS — N183 Chronic kidney disease, stage 3 (moderate): Secondary | ICD-10-CM | POA: Diagnosis not present

## 2018-04-22 DIAGNOSIS — I10 Essential (primary) hypertension: Secondary | ICD-10-CM | POA: Diagnosis not present

## 2018-04-22 DIAGNOSIS — Z6824 Body mass index (BMI) 24.0-24.9, adult: Secondary | ICD-10-CM | POA: Diagnosis not present

## 2018-04-22 DIAGNOSIS — I130 Hypertensive heart and chronic kidney disease with heart failure and stage 1 through stage 4 chronic kidney disease, or unspecified chronic kidney disease: Secondary | ICD-10-CM | POA: Diagnosis not present

## 2018-04-22 DIAGNOSIS — E1169 Type 2 diabetes mellitus with other specified complication: Secondary | ICD-10-CM | POA: Diagnosis not present

## 2018-05-13 DIAGNOSIS — Z6825 Body mass index (BMI) 25.0-25.9, adult: Secondary | ICD-10-CM | POA: Diagnosis not present

## 2018-05-13 DIAGNOSIS — M79605 Pain in left leg: Secondary | ICD-10-CM | POA: Diagnosis not present

## 2018-05-13 DIAGNOSIS — M79604 Pain in right leg: Secondary | ICD-10-CM | POA: Diagnosis not present

## 2018-07-22 DIAGNOSIS — I872 Venous insufficiency (chronic) (peripheral): Secondary | ICD-10-CM | POA: Diagnosis not present

## 2018-07-22 DIAGNOSIS — I878 Other specified disorders of veins: Secondary | ICD-10-CM | POA: Diagnosis not present

## 2018-07-22 DIAGNOSIS — Z6824 Body mass index (BMI) 24.0-24.9, adult: Secondary | ICD-10-CM | POA: Diagnosis not present

## 2018-07-22 DIAGNOSIS — E1169 Type 2 diabetes mellitus with other specified complication: Secondary | ICD-10-CM | POA: Diagnosis not present

## 2018-07-22 DIAGNOSIS — M1990 Unspecified osteoarthritis, unspecified site: Secondary | ICD-10-CM | POA: Diagnosis not present

## 2018-07-22 DIAGNOSIS — N183 Chronic kidney disease, stage 3 (moderate): Secondary | ICD-10-CM | POA: Diagnosis not present

## 2018-07-22 DIAGNOSIS — I10 Essential (primary) hypertension: Secondary | ICD-10-CM | POA: Diagnosis not present

## 2018-07-27 DIAGNOSIS — I771 Stricture of artery: Secondary | ICD-10-CM | POA: Diagnosis not present

## 2018-07-27 DIAGNOSIS — I25119 Atherosclerotic heart disease of native coronary artery with unspecified angina pectoris: Secondary | ICD-10-CM | POA: Diagnosis not present

## 2018-07-27 DIAGNOSIS — I447 Left bundle-branch block, unspecified: Secondary | ICD-10-CM | POA: Diagnosis not present

## 2018-07-27 DIAGNOSIS — I10 Essential (primary) hypertension: Secondary | ICD-10-CM | POA: Diagnosis not present

## 2018-09-11 DIAGNOSIS — H5201 Hypermetropia, right eye: Secondary | ICD-10-CM | POA: Diagnosis not present

## 2018-09-11 DIAGNOSIS — E113291 Type 2 diabetes mellitus with mild nonproliferative diabetic retinopathy without macular edema, right eye: Secondary | ICD-10-CM | POA: Diagnosis not present

## 2018-09-11 DIAGNOSIS — Z961 Presence of intraocular lens: Secondary | ICD-10-CM | POA: Diagnosis not present

## 2018-12-03 ENCOUNTER — Emergency Department (HOSPITAL_COMMUNITY): Payer: Medicare Other

## 2018-12-03 ENCOUNTER — Other Ambulatory Visit: Payer: Self-pay

## 2018-12-03 ENCOUNTER — Encounter (HOSPITAL_COMMUNITY): Payer: Self-pay

## 2018-12-03 ENCOUNTER — Emergency Department (HOSPITAL_COMMUNITY)
Admission: EM | Admit: 2018-12-03 | Discharge: 2018-12-03 | Disposition: A | Discharge status: Home / Self Care | Payer: Medicare Other | Attending: Emergency Medicine | Admitting: Emergency Medicine

## 2018-12-03 DIAGNOSIS — Y999 Unspecified external cause status: Secondary | ICD-10-CM | POA: Diagnosis not present

## 2018-12-03 DIAGNOSIS — Z7982 Long term (current) use of aspirin: Secondary | ICD-10-CM | POA: Diagnosis not present

## 2018-12-03 DIAGNOSIS — M25552 Pain in left hip: Secondary | ICD-10-CM | POA: Insufficient documentation

## 2018-12-03 DIAGNOSIS — S79912A Unspecified injury of left hip, initial encounter: Secondary | ICD-10-CM | POA: Diagnosis not present

## 2018-12-03 DIAGNOSIS — I2510 Atherosclerotic heart disease of native coronary artery without angina pectoris: Secondary | ICD-10-CM | POA: Diagnosis not present

## 2018-12-03 DIAGNOSIS — Y929 Unspecified place or not applicable: Secondary | ICD-10-CM | POA: Insufficient documentation

## 2018-12-03 DIAGNOSIS — M25561 Pain in right knee: Secondary | ICD-10-CM | POA: Insufficient documentation

## 2018-12-03 DIAGNOSIS — I10 Essential (primary) hypertension: Secondary | ICD-10-CM | POA: Insufficient documentation

## 2018-12-03 DIAGNOSIS — S0990XA Unspecified injury of head, initial encounter: Secondary | ICD-10-CM | POA: Insufficient documentation

## 2018-12-03 DIAGNOSIS — Z79899 Other long term (current) drug therapy: Secondary | ICD-10-CM | POA: Diagnosis not present

## 2018-12-03 DIAGNOSIS — M25562 Pain in left knee: Secondary | ICD-10-CM | POA: Diagnosis not present

## 2018-12-03 DIAGNOSIS — W19XXXA Unspecified fall, initial encounter: Secondary | ICD-10-CM | POA: Diagnosis not present

## 2018-12-03 DIAGNOSIS — S99922A Unspecified injury of left foot, initial encounter: Secondary | ICD-10-CM | POA: Diagnosis not present

## 2018-12-03 DIAGNOSIS — Y939 Activity, unspecified: Secondary | ICD-10-CM | POA: Diagnosis not present

## 2018-12-03 DIAGNOSIS — E119 Type 2 diabetes mellitus without complications: Secondary | ICD-10-CM | POA: Diagnosis not present

## 2018-12-03 DIAGNOSIS — S8992XA Unspecified injury of left lower leg, initial encounter: Secondary | ICD-10-CM | POA: Diagnosis not present

## 2018-12-03 DIAGNOSIS — S93602A Unspecified sprain of left foot, initial encounter: Secondary | ICD-10-CM | POA: Insufficient documentation

## 2018-12-03 DIAGNOSIS — M25572 Pain in left ankle and joints of left foot: Secondary | ICD-10-CM | POA: Diagnosis not present

## 2018-12-03 DIAGNOSIS — S99912A Unspecified injury of left ankle, initial encounter: Secondary | ICD-10-CM | POA: Diagnosis not present

## 2018-12-03 DIAGNOSIS — M79672 Pain in left foot: Secondary | ICD-10-CM | POA: Diagnosis not present

## 2018-12-03 MED ORDER — ACETAMINOPHEN 500 MG PO TABS
1000.0000 mg | ORAL_TABLET | Freq: Once | ORAL | Status: AC
  Administered 2018-12-03: 1000 mg via ORAL
  Filled 2018-12-03: qty 2

## 2018-12-03 NOTE — ED Provider Notes (Signed)
[facility]-EMERGENCY DEPT Provider Note   CSN: [id number] Arrival date & time: 12/03/18  1538    History   Chief Complaint Chief Complaint  Patient presents with  . Ankle Pain    left    HPI Sonya Chavez is a 85 y.o. female.     85 yo F with a chief complaint of left leg pain after a fall.  Patient has chronic pain to her right knee and felt like it buckled up and she fell down onto her left side.  Complaining of pain from her left foot up into her knee.  Also having some pain to the hip.  Was able to bear weight but feels it is worsening throughout the day.  Unsure if she struck her head.    The history is provided by the patient.  Ankle Pain  Associated symptoms: no fever   Injury  This is a new problem. The current episode started 6 to 12 hours ago. The problem occurs constantly. The problem has not changed since onset.Pertinent negatives include no chest pain, no headaches and no shortness of breath. The symptoms are aggravated by walking. Nothing relieves the symptoms. She has tried nothing for the symptoms. The treatment provided no relief.    Past Medical History:  Diagnosis Date  . Arthritis    "qwhere"  . Coronary artery disease   . Hypercholesteremia   . Hypertension   . Myocardial infarction (HCC) 2003  . SBO (small bowel obstruction) (HCC) 12/18/2016  . Stenosis of subclavian artery (HCC)   . Type II diabetes mellitus [facility](HCC)     Patient Active Problem List   Diagnosis Date Noted  . AKI (acute kidney injury) (HCC) 10/21/2017  . Delirium due to another medical condition 12/18/2016  . SBO (small bowel obstruction) (HCC) 12/18/2016  . 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 Leukocytosis 12/18/2016  . Peripheral artery disease (HCC) 11/08/2014  . PAD (peripheral artery disease) (HCC) 10/24/2014  . LBBB (left bundle branch block) 09/27/2014    Class: Chronic  . Subclavian artery stenosis, left (HCC) 09/27/2014  . Numbness and  tingling in left hand 03/08/2013  . HTN (hypertension) 03/08/2013  . DM (diabetes mellitus) (HCC) 03/08/2013  . CAD (coronary artery disease) 03/08/2013    Past Surgical History:  Procedure Laterality Date  . ABDOMINAL HYSTERECTOMY  1970  . BOWEL RESECTION N/A 12/20/2016   Procedure: SMALL BOWEL RESECTION;  Surgeon: Abigail MiyamotoBlackman, Douglas, MD;  Location: [facility] OR;  Service: General;  Laterality: N/A;  . CARDIAC CATHETERIZATION    . CATARACT EXTRACTION Bilateral 2000's  . CORONARY ARTERY [place]PASS [place]RAFT  2003   [place]  . LAPAROTOMY N/A 12/20/2016   Procedure: EXPLORATORY LAPAROTOMY;  Surgeon: Abigail MiyamotoBlackman, Douglas, MD;  [facility];  Service: General;  Laterality: N/A;  . LEFT HEART CATHETERIZATION WITH CORONARY/GRAFT ANGIOGRAM N/A 09/27/2014   Procedure: LEFT HEART CATHETERIZATION WITH Isabel CapriceORONARY/GRAFT ANGIOGRAM;  Surgeon: Pamella PertJagadeesh R Ganji, MD;  [facility];  Service: Cardiovascular;  Laterality: N/A;  . UNILATERAL UPPER EXTREMEITY ANGIOGRAM N/A 09/27/2014   Procedure: SUBCLAVIAN ARTERIOGRAM ;  Surgeon: Pamella PertJagadeesh R Ganji, MD;  [facility];  Service: Cardiovascular;  Laterality: N/A;  . UNILATERAL UPPER EXTREMEITY ANGIOGRAM N/A 11/09/2014   Procedure: Left subclavian stent ;  Surgeon: Yates DecampJay Ganji, MD;  [facility];  Service: Cardiovascular;  Laterality: N/A;  . VISCERAL ANGIOGRAM N/A 10/25/2014   Procedure: Laurence SlateSUBCLAVIAN ANGIOGRAM;  Surgeon: Yates DecampJay Ganji, MD;  [facility];  Service: Cardiovascular;  Laterality: N/A;     OB History   No obstetric history on file.      Home Medications    Prior to Admission medications   Medication Sig Start Date End Date Taking? Authorizing Provider  alendronate (FOSAMAX) 70 MG tablet Take 70 mg by mouth every Monday.  12/08/14   [provider]  amLODipine (NORVASC) 10 MG tablet Take 10 mg by mouth at bedtime.  10/12/14   [provider]  aspirin EC 81 MG tablet Take 81 mg by mouth at bedtime.     [provider]  Calcium Citrate-Vitamin D (CALCIUM CITRATE + PO) Take 1 tablet by mouth daily.     [provider]  cephALEXin (KEFLEX) 500 MG capsule Take 1 capsule (500 mg total) by mouth 2 (two) times daily. 12/28/17   Charlynne Pander, MD  gabapentin (NEURONTIN) 300 MG capsule Take 300 mg by mouth at bedtime.  04/09/17   [provider]  hydrALAZINE (APRESOLINE) 25 MG tablet Take 25 mg by mouth daily.  03/21/17   [provider]    Family History Family History  Problem Relation Age of Onset  . Gallbladder disease Neg Hx     Social History Social History   Tobacco Use  . Smoking status: Never Smoker  . Smokeless tobacco: Never Used  Substance Use Topics  . Alcohol use: No  . Drug use: No     Allergies   Statins; Crestor [rosuvastatin]; Lipitor [atorvastatin]; and Zocor [simvastatin]   Review of Systems Review of Systems  Constitutional: Negative for chills and fever.  HENT: Negative for congestion and rhinorrhea.   Eyes: Negative for redness and visual disturbance.  Respiratory: Negative for shortness of breath and wheezing.   Cardiovascular: Negative for chest pain and palpitations.  Gastrointestinal: Negative for nausea and vomiting.  Genitourinary: Negative for dysuria and urgency.  Musculoskeletal: Positive for arthralgias and myalgias.  Skin: Negative for pallor and wound.  Neurological: Negative for dizziness and headaches.     Physical Exam Updated Vital Signs BP (!) 170/57 (BP Location: Left Arm)   Pulse 66   Temp 98.6 F (37 C) (Oral)   Resp 14   Ht  (1.575 m)   Wt 59 kg   SpO2 97%   BMI 23.78 kg/m   Physical Exam Vitals signs and nursing note reviewed.  Constitutional:      General: She is not in acute distress.    Appearance: She is well-developed. She is not diaphoretic.  HENT:     Head: Normocephalic and atraumatic.  Eyes:     Pupils: Pupils are equal, round, and reactive to light.  Neck:      Musculoskeletal: Normal range of motion and neck supple.  Cardiovascular:     Rate and Rhythm: Normal rate and regular rhythm.     Heart sounds: No murmur. No friction rub. No gallop.   Pulmonary:     Effort: Pulmonary effort is normal.     Breath sounds: No wheezing or rales.  Abdominal:     General: There is no distension.     Palpations: Abdomen is soft.     Tenderness: There is no abdominal tenderness.  Musculoskeletal:        General: Tenderness present.     Comments: Left foot pain about the base of the fifth metatarsal. Pain along bilateral malleolus.    PMS intact distally.  Skin:    General: Skin is warm and dry.  Neurological:     Mental Status: She is alert and oriented to person, place, and time.  Psychiatric:        Behavior: Behavior normal.      ED Treatments / Results  Labs (all labs ordered are listed, but only abnormal results are displayed) Labs Reviewed - No data to display  EKG None  Radiology Dg Ankle Complete Left  Result Date: 12/03/2018 CLINICAL DATA:  Larey Seat this morning getting out of bed when knee gave out, LEFT ankle and foot pain, unable to bear weight EXAM: LEFT ANKLE COMPLETE - 3+ VIEW COMPARISON:  None FINDINGS: Osseous demineralization. Scattered soft tissue swelling LEFT ankle and foot. Joint spaces preserved. No acute fracture, dislocation, or bone destruction. Plantar and Achilles insertion calcaneal spur formation. IMPRESSION: No acute osseous abnormalities. Electronically Signed   By: Ulyses Southward M.D.   On: 12/03/2018 17:26   Ct Head Wo Contrast  Result Date: 12/03/2018 CLINICAL DATA:  Minor head trauma, fell out of bed this morning, does not remember striking head EXAM: CT HEAD WITHOUT CONTRAST TECHNIQUE: Contiguous axial images were obtained from the base of the skull through the vertex without intravenous contrast. Sagittal and coronal MPR images reconstructed from axial data set. COMPARISON:  12/18/2016 FINDINGS: Brain: Generalized  atrophy. Normal ventricular morphology. No midline shift or mass effect. Small vessel chronic ischemic changes of deep cerebral white matter. No intracranial hemorrhage, mass lesion, evidence of acute infarction, or extra-axial fluid collection. Vascular: Atherosclerotic calcifications of internal carotid arteries at skull base Skull: Intact Sinuses/Orbits: Clear Other: N/A IMPRESSION: Atrophy with small vessel chronic ischemic changes of deep cerebral white matter. No acute intracranial abnormalities. Electronically Signed   By: Ulyses Southward M.D.   On: 12/03/2018 16:56   Dg Knee Complete 4 Views Left  Result Date: 12/03/2018 CLINICAL DATA:  Larey Seat this morning getting out of bed when knee gave out, LEFT ankle and foot pain, unable to bear weight EXAM: LEFT KNEE - COMPLETE 4+ VIEW COMPARISON:  None FINDINGS: Diffuse osseous demineralization. Joint space narrowing and spur formation greatest at lateral compartment. No acute fracture, dislocation, or bone destruction. No knee joint effusion. Scattered atherosclerotic calcifications of superficial femoral and popliteal arteries. Surgical clips at medial aspect of LEFT leg either representing prior vascular surgery vein harvest. IMPRESSION: Osseous demineralization with osteoarthritic changes LEFT knee greatest at lateral compartment. No acute abnormalities. Electronically Signed   By: Ulyses Southward M.D.   On: 12/03/2018 17:29   Dg Foot Complete Left  Result Date: 12/03/2018 CLINICAL DATA:  Larey Seat this morning getting out of bed when knee gave out, LEFT ankle and foot pain, unable to bear weight EXAM: LEFT FOOT - COMPLETE 3+ VIEW COMPARISON:  None FINDINGS: Diffuse osseous demineralization. Joint spaces preserved. Plantar and Achilles insertion calcaneal spurs. No acute fracture, dislocation, or bone destruction. Soft tissue calcification dorsal to the plantar aspect of the calcaneus. Diffuse soft tissue swelling LEFT foot. IMPRESSION: No acute osseous abnormalities.  Electronically Signed   By: Ulyses Southward M.D.   On: 12/03/2018 17:25   Dg Hip Unilat W Or Wo Pelvis 2-3 Views Left  Result Date: 12/03/2018 CLINICAL DATA:  Larey Seat getting out of bed this morning when knee gave out, has bad knee, having LEFT ankle and foot pain since along with some swelling, unable to bear weight EXAM: DG HIP (WITH OR WITHOUT PELVIS) 2-3V LEFT COMPARISON:  None FINDINGS: Osseous demineralization. Hip and SI joint spaces preserved. No acute fracture, dislocation, or bone destruction. Degenerative facet disease  changes at visualized lower lumbar spine. Scattered atherosclerotic calcifications with surgical clips at the medial LEFT thigh question prior vascular surgery. IMPRESSION: No acute abnormalities. Electronically Signed   By: Ulyses Southward M.D.   On: 12/03/2018 17:22    Procedures Procedures (including critical care time)  Medications Ordered in ED Medications  acetaminophen (TYLENOL) tablet 1,000 mg (1,000 mg Oral Given 12/03/18 1710)     Initial Impression / Assessment and Plan / ED Course  I have reviewed the triage vital signs and the nursing notes.  Pertinent labs & imaging results that were available during my care of the patient were reviewed by me and considered in my medical decision making (see chart for details).        85 yo F with a chief complaint of left leg pain after a fall.  Patient has chronic pain to her right knee and felt like it buckled up and she fell down onto her left side.  Complaining of pain from her left foot up into her knee.  Also having some pain to the hip.  Was able to bear weight but feels it is worsening throughout the day.  Unsure if she struck her head.  Will obtain a CT of the head plain films of the leg reassess.  Plain films viewed by me all negative.  D/c home.   5:42 PM:  I have discussed the diagnosis/risks/treatment options with the patient and believe the pt to be eligible for discharge home to follow-up with PCP. We also  discussed returning to the ED immediately if new or worsening sx occur. We discussed the sx which are most concerning (e.g., sudden worsening pain, fever, inability to tolerate by mouth) that necessitate immediate return. Medications administered to the patient during their visit and any new prescriptions provided to the patient are listed below.  Medications given during this visit Medications  acetaminophen (TYLENOL) tablet 1,000 mg (1,000 mg Oral Given 12/03/18 1710)     The patient appears reasonably screen and/or stabilized for discharge and I doubt any other medical condition or other [facility] requiring further screening, evaluation, or treatment in the ED at this time prior to discharge.    Final Clinical Impressions(s) / ED Diagnoses   Final diagnoses:  Foot sprain, left, initial encounter    ED Discharge Orders    None       Melene Plan, DO 12/03/18 1742

## 2018-12-03 NOTE — ED Triage Notes (Signed)
Pt states that she was getting out of bed this morning when her knee gave out on her (pt states "its a bad knee"). Pt states since then, she has been having left ankle and foot pain, along with some swelling. Pt has been able to bear weight, but it has been uncomfortable.

## 2018-12-29 ENCOUNTER — Encounter (HOSPITAL_COMMUNITY): Payer: Self-pay

## 2018-12-29 ENCOUNTER — Emergency Department (HOSPITAL_COMMUNITY): Payer: Medicare Other

## 2018-12-29 ENCOUNTER — Inpatient Hospital Stay (HOSPITAL_COMMUNITY)
Admission: EM | Admit: 2018-12-29 | Discharge: 2019-01-05 | DRG: 388 | Disposition: A | Discharge status: Home Health | Payer: Medicare Other | Attending: Family Medicine | Admitting: Family Medicine

## 2018-12-29 ENCOUNTER — Other Ambulatory Visit: Payer: Self-pay

## 2018-12-29 DIAGNOSIS — Z0189 Encounter for other specified special examinations: Secondary | ICD-10-CM

## 2018-12-29 DIAGNOSIS — Z0389 Encounter for observation for other suspected diseases and conditions ruled out: Secondary | ICD-10-CM | POA: Diagnosis not present

## 2018-12-29 DIAGNOSIS — K565 Intestinal adhesions [bands], unspecified as to partial versus complete obstruction: Principal | ICD-10-CM | POA: Diagnosis present

## 2018-12-29 DIAGNOSIS — Z90710 Acquired absence of both cervix and uterus: Secondary | ICD-10-CM | POA: Diagnosis not present

## 2018-12-29 DIAGNOSIS — K8000 Calculus of gallbladder with acute cholecystitis without obstruction: Secondary | ICD-10-CM | POA: Diagnosis present

## 2018-12-29 DIAGNOSIS — R0902 Hypoxemia: Secondary | ICD-10-CM | POA: Diagnosis not present

## 2018-12-29 DIAGNOSIS — F05 Delirium due to known physiological condition: Secondary | ICD-10-CM | POA: Diagnosis present

## 2018-12-29 DIAGNOSIS — Z4682 Encounter for fitting and adjustment of non-vascular catheter: Secondary | ICD-10-CM | POA: Diagnosis not present

## 2018-12-29 DIAGNOSIS — E785 Hyperlipidemia, unspecified: Secondary | ICD-10-CM | POA: Diagnosis present

## 2018-12-29 DIAGNOSIS — Z9049 Acquired absence of other specified parts of digestive tract: Secondary | ICD-10-CM

## 2018-12-29 DIAGNOSIS — R1111 Vomiting without nausea: Secondary | ICD-10-CM | POA: Diagnosis not present

## 2018-12-29 DIAGNOSIS — N179 Acute kidney failure, unspecified: Secondary | ICD-10-CM | POA: Diagnosis not present

## 2018-12-29 DIAGNOSIS — Z9842 Cataract extraction status, left eye: Secondary | ICD-10-CM

## 2018-12-29 DIAGNOSIS — I10 Essential (primary) hypertension: Secondary | ICD-10-CM | POA: Diagnosis not present

## 2018-12-29 DIAGNOSIS — E7800 Pure hypercholesterolemia, unspecified: Secondary | ICD-10-CM | POA: Diagnosis present

## 2018-12-29 DIAGNOSIS — Z7982 Long term (current) use of aspirin: Secondary | ICD-10-CM | POA: Diagnosis not present

## 2018-12-29 DIAGNOSIS — N183 Chronic kidney disease, stage 3 unspecified: Secondary | ICD-10-CM | POA: Diagnosis present

## 2018-12-29 DIAGNOSIS — Z888 Allergy status to other drugs, medicaments and biological substances status: Secondary | ICD-10-CM

## 2018-12-29 DIAGNOSIS — M810 Age-related osteoporosis without current pathological fracture: Secondary | ICD-10-CM | POA: Diagnosis present

## 2018-12-29 DIAGNOSIS — G9341 Metabolic encephalopathy: Secondary | ICD-10-CM | POA: Diagnosis present

## 2018-12-29 DIAGNOSIS — K56609 Unspecified intestinal obstruction, unspecified as to partial versus complete obstruction: Secondary | ICD-10-CM | POA: Diagnosis not present

## 2018-12-29 DIAGNOSIS — Z951 Presence of aortocoronary bypass graft: Secondary | ICD-10-CM

## 2018-12-29 DIAGNOSIS — I129 Hypertensive chronic kidney disease with stage 1 through stage 4 chronic kidney disease, or unspecified chronic kidney disease: Secondary | ICD-10-CM | POA: Diagnosis present

## 2018-12-29 DIAGNOSIS — E876 Hypokalemia: Secondary | ICD-10-CM | POA: Diagnosis present

## 2018-12-29 DIAGNOSIS — Z1159 Encounter for screening for other viral diseases: Secondary | ICD-10-CM | POA: Diagnosis not present

## 2018-12-29 DIAGNOSIS — Z79899 Other long term (current) drug therapy: Secondary | ICD-10-CM

## 2018-12-29 DIAGNOSIS — Z7983 Long term (current) use of bisphosphonates: Secondary | ICD-10-CM

## 2018-12-29 DIAGNOSIS — K8020 Calculus of gallbladder without cholecystitis without obstruction: Secondary | ICD-10-CM

## 2018-12-29 DIAGNOSIS — I708 Atherosclerosis of other arteries: Secondary | ICD-10-CM | POA: Diagnosis present

## 2018-12-29 DIAGNOSIS — Z5329 Procedure and treatment not carried out because of patient's decision for other reasons: Secondary | ICD-10-CM | POA: Diagnosis present

## 2018-12-29 DIAGNOSIS — K5730 Diverticulosis of large intestine without perforation or abscess without bleeding: Secondary | ICD-10-CM | POA: Diagnosis not present

## 2018-12-29 DIAGNOSIS — Z03818 Encounter for observation for suspected exposure to other biological agents ruled out: Secondary | ICD-10-CM | POA: Diagnosis not present

## 2018-12-29 DIAGNOSIS — I252 Old myocardial infarction: Secondary | ICD-10-CM | POA: Diagnosis not present

## 2018-12-29 DIAGNOSIS — R197 Diarrhea, unspecified: Secondary | ICD-10-CM | POA: Diagnosis not present

## 2018-12-29 DIAGNOSIS — E1122 Type 2 diabetes mellitus with diabetic chronic kidney disease: Secondary | ICD-10-CM | POA: Diagnosis present

## 2018-12-29 DIAGNOSIS — R112 Nausea with vomiting, unspecified: Secondary | ICD-10-CM | POA: Diagnosis not present

## 2018-12-29 DIAGNOSIS — I2510 Atherosclerotic heart disease of native coronary artery without angina pectoris: Secondary | ICD-10-CM | POA: Diagnosis present

## 2018-12-29 DIAGNOSIS — Z9841 Cataract extraction status, right eye: Secondary | ICD-10-CM | POA: Diagnosis not present

## 2018-12-29 DIAGNOSIS — E119 Type 2 diabetes mellitus without complications: Secondary | ICD-10-CM | POA: Diagnosis not present

## 2018-12-29 DIAGNOSIS — K8001 Calculus of gallbladder with acute cholecystitis with obstruction: Secondary | ICD-10-CM | POA: Diagnosis not present

## 2018-12-29 DIAGNOSIS — K819 Cholecystitis, unspecified: Secondary | ICD-10-CM

## 2018-12-29 LAB — URINALYSIS, ROUTINE W REFLEX MICROSCOPIC
Bacteria, UA: NONE SEEN
Bilirubin Urine: NEGATIVE
Glucose, UA: NEGATIVE mg/dL
Hgb urine dipstick: NEGATIVE
Ketones, ur: 5 mg/dL — AB
Leukocytes,Ua: NEGATIVE
Nitrite: NEGATIVE
Protein, ur: 100 mg/dL — AB
Specific Gravity, Urine: 1.013 (ref 1.005–1.030)
pH: 8 (ref 5.0–8.0)

## 2018-12-29 LAB — CBC WITH DIFFERENTIAL/PLATELET
Abs Immature Granulocytes: 0.03 10*3/uL (ref 0.00–0.07)
Basophils Absolute: 0 10*3/uL (ref 0.0–0.1)
Basophils Relative: 0 %
Eosinophils Absolute: 0 10*3/uL (ref 0.0–0.5)
Eosinophils Relative: 0 %
HCT: 35.3 % — ABNORMAL LOW (ref 36.0–46.0)
Hemoglobin: 10.8 g/dL — ABNORMAL LOW (ref 12.0–15.0)
Immature Granulocytes: 0 %
Lymphocytes Relative: 8 %
Lymphs Abs: 1 10*3/uL (ref 0.7–4.0)
MCH: 26 pg (ref 26.0–34.0)
MCHC: 30.6 g/dL (ref 30.0–36.0)
MCV: 84.9 fL (ref 80.0–100.0)
Monocytes Absolute: 1 10*3/uL (ref 0.1–1.0)
Monocytes Relative: 8 %
Neutro Abs: 9.6 10*3/uL — ABNORMAL HIGH (ref 1.7–7.7)
Neutrophils Relative %: 84 %
Platelets: 186 10*3/uL (ref 150–400)
RBC: 4.16 MIL/uL (ref 3.87–5.11)
RDW: 15 % (ref 11.5–15.5)
WBC: 11.5 10*3/uL — ABNORMAL HIGH (ref 4.0–10.5)
nRBC: 0 % (ref 0.0–0.2)

## 2018-12-29 LAB — SARS CORONAVIRUS 2 BY RT PCR (HOSPITAL ORDER, PERFORMED IN ~~LOC~~ HOSPITAL LAB): SARS Coronavirus 2: NEGATIVE

## 2018-12-29 LAB — COMPREHENSIVE METABOLIC PANEL
ALT: 12 U/L (ref 0–44)
AST: 17 U/L (ref 15–41)
Albumin: 3.9 g/dL (ref 3.5–5.0)
Alkaline Phosphatase: 98 U/L (ref 38–126)
Anion gap: 12 (ref 5–15)
BUN: 33 mg/dL — ABNORMAL HIGH (ref 8–23)
CO2: 28 mmol/L (ref 22–32)
Calcium: 9.6 mg/dL (ref 8.9–10.3)
Chloride: 104 mmol/L (ref 98–111)
Creatinine, Ser: 1.34 mg/dL — ABNORMAL HIGH (ref 0.44–1.00)
GFR calc Af Amer: 40 mL/min — ABNORMAL LOW (ref >60)
GFR calc non Af Amer: 35 mL/min — ABNORMAL LOW (ref >60)
Glucose, Bld: 195 mg/dL — ABNORMAL HIGH (ref 70–99)
Potassium: 4 mmol/L (ref 3.5–5.1)
Sodium: 144 mmol/L (ref 135–145)
Total Bilirubin: 0.8 mg/dL (ref 0.3–1.2)
Total Protein: 7.4 g/dL (ref 6.5–8.1)

## 2018-12-29 LAB — GLUCOSE, CAPILLARY: Glucose-Capillary: 164 mg/dL — ABNORMAL HIGH (ref 70–99)

## 2018-12-29 LAB — LIPASE, BLOOD: Lipase: 26 U/L (ref 11–51)

## 2018-12-29 MED ORDER — SODIUM CHLORIDE (PF) 0.9 % IJ SOLN
INTRAMUSCULAR | Status: AC
  Filled 2018-12-29: qty 50

## 2018-12-29 MED ORDER — IOHEXOL 300 MG/ML  SOLN
75.0000 mL | Freq: Once | INTRAMUSCULAR | Status: AC | PRN
  Administered 2018-12-29: 18:00:00 75 mL via INTRAVENOUS

## 2018-12-29 MED ORDER — HYDRALAZINE HCL 20 MG/ML IJ SOLN
5.0000 mg | Freq: Four times a day (QID) | INTRAMUSCULAR | Status: DC | PRN
  Administered 2018-12-30 – 2019-01-01 (×3): 5 mg via INTRAVENOUS
  Filled 2018-12-29 (×3): qty 1

## 2018-12-29 MED ORDER — ACETAMINOPHEN 325 MG PO TABS: 650.0000 mg | ORAL_TABLET | Freq: Four times a day (QID) | ORAL | Status: DC | PRN

## 2018-12-29 MED ORDER — ONDANSETRON HCL 4 MG/2ML IJ SOLN
4.0000 mg | Freq: Once | INTRAMUSCULAR | Status: AC
  Administered 2018-12-29: 15:00:00 4 mg via INTRAVENOUS
  Filled 2018-12-29: qty 2

## 2018-12-29 MED ORDER — ENOXAPARIN SODIUM 40 MG/0.4ML ~~LOC~~ SOLN
40.0000 mg | SUBCUTANEOUS | Status: DC
  Administered 2018-12-30: 40 mg via SUBCUTANEOUS
  Filled 2018-12-29 (×2): qty 0.4

## 2018-12-29 MED ORDER — SODIUM CHLORIDE 0.9 % IV SOLN
INTRAVENOUS | Status: AC
  Administered 2018-12-29 – 2018-12-30 (×3): via INTRAVENOUS

## 2018-12-29 MED ORDER — ACETAMINOPHEN 650 MG RE SUPP: 650.0000 mg | Freq: Four times a day (QID) | RECTAL | Status: DC | PRN

## 2018-12-29 MED ORDER — ONDANSETRON HCL 4 MG/2ML IJ SOLN: 4.0000 mg | Freq: Four times a day (QID) | INTRAMUSCULAR | Status: DC | PRN

## 2018-12-29 MED ORDER — SODIUM CHLORIDE 0.9 % IV BOLUS
1000.0000 mL | Freq: Once | INTRAVENOUS | Status: AC
  Administered 2018-12-29: 15:00:00 1000 mL via INTRAVENOUS

## 2018-12-29 MED ORDER — HYDRALAZINE HCL 20 MG/ML IJ SOLN
5.0000 mg | Freq: Three times a day (TID) | INTRAMUSCULAR | Status: DC
  Administered 2018-12-29 – 2019-01-05 (×19): 5 mg via INTRAVENOUS
  Filled 2018-12-29 (×20): qty 1

## 2018-12-29 MED ORDER — HYDROMORPHONE HCL 1 MG/ML IJ SOLN
1.0000 mg | INTRAMUSCULAR | Status: DC | PRN
  Administered 2018-12-30 – 2019-01-03 (×13): 1 mg via INTRAVENOUS
  Filled 2018-12-29 (×13): qty 1

## 2018-12-29 MED ORDER — INSULIN ASPART 100 UNIT/ML ~~LOC~~ SOLN
0.0000 [IU] | SUBCUTANEOUS | Status: DC
  Administered 2018-12-30: 1 [IU] via SUBCUTANEOUS
  Administered 2018-12-30: 2 [IU] via SUBCUTANEOUS
  Administered 2018-12-30: 1 [IU] via SUBCUTANEOUS
  Administered 2018-12-30: 2 [IU] via SUBCUTANEOUS
  Administered 2018-12-30: 1 [IU] via SUBCUTANEOUS
  Administered 2018-12-30: 01:00:00 2 [IU] via SUBCUTANEOUS
  Administered 2018-12-31 (×2): 1 [IU] via SUBCUTANEOUS
  Administered 2018-12-31 – 2019-01-01 (×2): 2 [IU] via SUBCUTANEOUS
  Administered 2019-01-01 (×4): 1 [IU] via SUBCUTANEOUS
  Administered 2019-01-01 – 2019-01-02 (×4): 2 [IU] via SUBCUTANEOUS
  Administered 2019-01-02: 3 [IU] via SUBCUTANEOUS
  Administered 2019-01-03: 2 [IU] via SUBCUTANEOUS
  Administered 2019-01-03: 12:00:00 7 [IU] via SUBCUTANEOUS
  Administered 2019-01-03: 1 [IU] via SUBCUTANEOUS
  Administered 2019-01-04: 3 [IU] via SUBCUTANEOUS
  Administered 2019-01-04 (×2): 1 [IU] via SUBCUTANEOUS
  Administered 2019-01-04: 3 [IU] via SUBCUTANEOUS
  Administered 2019-01-04: 1 [IU] via SUBCUTANEOUS
  Administered 2019-01-05 (×2): 2 [IU] via SUBCUTANEOUS

## 2018-12-29 NOTE — ED Provider Notes (Signed)
[facility]-EMERGENCY DEPT Provider Note   CSN: [id number] Arrival date & time: 12/29/18  1356    History   Chief Complaint Chief Complaint  Patient presents with  . Nausea  . Emesis   Additional history obtained from husband via phone  HPI Sonya Chavez is a 85 y.o. female.     HPI Patient is a 85 year old female presents the emergency department with reported nausea and vomiting since last night approximately 4 AM.  Somewhat looser stool but not frank diarrhea.  No blood in her vomit or blood in her stool.  Denies abdominal pain or chest pain at this time.  No fever.  No urinary complaints.  Family became concerned in regards to her decreased oral intake today.  Her vomiting seem to be intermittent.  Patient is not vomiting in the ER at this time.  She still has some nausea.  She denies abdominal pain.  She does have a history of small bowel obstruction.     Past Medical History:  Diagnosis Date  . Arthritis    "qwhere"  . Coronary artery disease   . Hypercholesteremia   . Hypertension   . Myocardial infarction (HCC) 2003  . SBO (small bowel obstruction) (HCC) 12/18/2016  . Stenosis of subclavian artery (HCC)   . Type II diabetes mellitus [facility](HCC)     Patient Active Problem List   Diagnosis Date Noted  . AKI (acute kidney injury) (HCC) 10/21/2017  . Delirium due to another medical condition 12/18/2016  . SBO (small bowel obstruction) (HCC) 12/18/2016  . 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 Leukocytosis 12/18/2016  . Peripheral artery disease (HCC) 11/08/2014  . PAD (peripheral artery disease) (HCC) 10/24/2014  . LBBB (left bundle branch block) 09/27/2014    Class: Chronic  . Subclavian artery stenosis, left (HCC) 09/27/2014  . Numbness and tingling in left hand 03/08/2013  . HTN (hypertension) 03/08/2013  . DM (diabetes mellitus) (HCC) 03/08/2013  . CAD (coronary artery disease) 03/08/2013    Past Surgical History:  Procedure  Laterality Date  . ABDOMINAL HYSTERECTOMY  1970  . BOWEL RESECTION N/A 12/20/2016   Procedure: SMALL BOWEL RESECTION;  Surgeon: Abigail MiyamotoBlackman, Douglas, MD;  Location: [facility] OR;  Service: General;  Laterality: N/A;  . CARDIAC CATHETERIZATION    . CATARACT EXTRACTION Bilateral 2000's  . [place]ONARY [place]  . LAPAROTOMY N/A 12/20/2016   Procedure: EXPLORATORY LAPAROTOMY;  Surgeon: Abigail MiyamotoBlackman, Douglas, MD;  [facility];  Service: General;  Laterality: N/A;  . LEFT HEART CATHETERIZATION WITH CORONARY/GRAFT ANGIOGRAM N/A 09/27/2014   Procedure: LEFT HEART CATHETERIZATION WITH Isabel CapriceORONARY/GRAFT ANGIOGRAM;  Surgeon: Pamella PertJagadeesh R Ganji, MD;  [facility];  Service: Cardiovascular;  Laterality: N/A;  . UNILATERAL UPPER EXTREMEITY ANGIOGRAM N/A 09/27/2014   Procedure: SUBCLAVIAN ARTERIOGRAM ;  Surgeon: Pamella PertJagadeesh R Ganji, MD;  [facility];  Service: Cardiovascular;  Laterality: N/A;  . UNILATERAL UPPER EXTREMEITY ANGIOGRAM N/A 11/09/2014   Procedure: Left subclavian stent ;  Surgeon: Yates DecampJay Ganji, MD;  [facility];  Service: Cardiovascular;  Laterality: N/A;  . VISCERAL ANGIOGRAM N/A 10/25/2014   Procedure: Laurence SlateSUBCLAVIAN ANGIOGRAM;  Surgeon: Yates DecampJay Ganji, MD;  [facility];  Service: Cardiovascular;  Laterality: N/A;     OB History   No obstetric history on file.      Home Medications    Prior to Admission medications   Medication Sig Start Date End Date Taking? Authorizing Provider  alendronate (  FOSAMAX) 70 MG tablet Take 70 mg by mouth every Monday.  12/08/14   [provider]  amLODipine (NORVASC) 10 MG tablet Take 10 mg by mouth at bedtime.  10/12/14   [provider]  aspirin EC 81 MG tablet Take 81 mg by mouth at bedtime.     [provider]  Calcium Citrate-Vitamin D (CALCIUM CITRATE + PO) Take 1 tablet by mouth daily.     [provider]  cephALEXin (KEFLEX) 500 MG capsule Take 1 capsule (500 mg total) by  mouth 2 (two) times daily. 12/28/17   Charlynne Pander, MD  gabapentin (NEURONTIN) 300 MG capsule Take 300 mg by mouth at bedtime.  04/09/17   [provider]  hydrALAZINE (APRESOLINE) 25 MG tablet Take 25 mg by mouth daily.  03/21/17   [provider]    Family History Family History  Problem Relation Age of Onset  . Gallbladder disease Neg Hx     Social History Social History   Tobacco Use  . Smoking status: Never Smoker  . Smokeless tobacco: Never Used  Substance Use Topics  . Alcohol use: No  . Drug use: No     Allergies   Statins; Crestor [rosuvastatin]; Lipitor [atorvastatin]; and Zocor [simvastatin]   Review of Systems Review of Systems  All other systems reviewed and are negative.    Physical Exam Updated Vital Signs BP (!) 161/47   Pulse 64   Temp 98.1 F (36.7 C) (Oral)   Resp 16   SpO2 95%   Physical Exam Vitals signs and nursing note reviewed.  Constitutional:      General: She is not in acute distress.    Appearance: She is well-developed.  HENT:     Head: Normocephalic and atraumatic.  Neck:     Musculoskeletal: Normal range of motion.  Cardiovascular:     Rate and Rhythm: Normal rate and regular rhythm.     Heart sounds: Normal heart sounds.  Pulmonary:     Effort: Pulmonary effort is normal.     Breath sounds: Normal breath sounds.  Abdominal:     General: There is no distension.     Palpations: Abdomen is soft.     Tenderness: There is no abdominal tenderness.  Musculoskeletal: Normal range of motion.  Skin:    General: Skin is warm and dry.  Neurological:     Mental Status: She is alert and oriented to person, place, and time.  Psychiatric:        Judgment: Judgment normal.      ED Treatments / Results  Labs (all labs ordered are listed, but only abnormal results are displayed) Labs Reviewed  CBC WITH DIFFERENTIAL/PLATELET - Abnormal; Notable for the following components:      Result Value   WBC 11.5 (*)     Hemoglobin 10.8 (*)    HCT 35.3 (*)    Neutro Abs 9.6 (*)    All other components within normal limits  COMPREHENSIVE METABOLIC PANEL - Abnormal; Notable for the following components:   Glucose, Bld 195 (*)    BUN 33 (*)    Creatinine, Ser 1.34 (*)    GFR calc non Af Amer 35 (*)    GFR calc Af Amer 40 (*)    All other components within normal limits  LIPASE, BLOOD  URINALYSIS, ROUTINE W REFLEX MICROSCOPIC    EKG None  Radiology No results found.  Procedures Procedures (including critical care time)  Medications Ordered in ED Medications  ondansetron [facility]) injection 4 mg (4 mg Intravenous Given 12/29/18 1527)  sodium chloride 0.9 % bolus 1,000 mL (1,000 mLs Intravenous New Bag/Given 12/29/18 1527)     Initial Impression / Assessment and Plan / ED Course  I have reviewed the triage vital signs and the nursing notes.  Pertinent labs & imaging results that were available during my care of the patient were reviewed by me and considered in my medical decision making (see chart for details).        Abdominal exam is reassuring at this time.  Labs and plain films pending at this time.  I suspect of her labs and plain films demonstrate no significant abnormality and the patient keep fluids down she likely can be safely discharged home into the care of her husband.  Her vital signs are stable.  She is nontoxic-appearing.  Care transferred to Dr. Ranae Palms at 4:42 PM   Final Clinical Impressions(s) / ED Diagnoses   Final diagnoses:  None    ED Discharge Orders    None       Azalia Bilis, MD 12/29/18 1642

## 2018-12-29 NOTE — ED Notes (Signed)
Pure wick has been placed. Pt has been educated. Suction set to 45mmHg 

## 2018-12-29 NOTE — H&P (Signed)
TRH H&P    Patient Demographics:    [place] Sonya Chavez, is a 85 y.o. female  MRN: [id number]  DOB - [birth year]  Admit Date - 12/29/2018  Referring MD/NP/PA: Loren Racer  Outpatient Primary MD for the patient is Alysia Penna, MD  Patient coming from: home  Chief complaint-  SBO   HPI:    [place] Pranger  is a 85 y.o. 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  Pt's husband denies fever, chills, abd pain, diarrhea, brbpr, dysuria, hematuria. Spoke with Theodoro Parma [phone], he thinks that she might have some dementia.    In ED,  T 98.1  P 77 R 21, Bp 158/56  Pox 95%  CT scan abd/ pelvis IMPRESSION: 1. Small bowel obstruction in the left lower quadrant, with distended stomach and distended small bowel extending down to what appears to probably be an angulated loop of small bowel, query adhesion. There is a trace amount of free fluid especially in the right paracolic gutter. 2. Thick-walled gallbladder with chronic very large gallstone measuring up to 8 cm in diameter. Acute cholecystitis is not Excluded.  3. Distended and thick-walled esophagus. Although the distension may be from the distal obstruction, wall thickening suggests esophagitis. 4. Trace bilateral pleural effusions.  Distended pulmonary veins. 5.  Aortic Atherosclerosis (ICD10-I70.0).  Coronary atherosclerosis. 6. Mild chronic intrahepatic and extrahepatic biliary dilatation. Stable. Dilated dorsal pancreatic duct in the pancreatic head. 7. Nonspecific hypodense lesion in the spleen has enlarged compared to 12/18/2016. 8. Sigmoid colon diverticulosis. 9. 7 mm of degenerative anterolisthesis at L4-5 resulting in bilateral foraminal impingement at this  level.  CXR PA chest: No edema or consolidation. There is mild scarring the left apex. Heart size and pulmonary vascular normal. No adenopathy. There is aortic atherosclerosis. Patient is status post median sternotomy.  Wbc 11.5, Hgb 10.8, Plt 186 Na 144, K 4.0, Bun 33, Creatinine 1.34   Ast 17, Alt 12 Lipase 26 Glucose 195 Urinalysis negative   SARS negative  Pt will be admitted for n/v, SBO.       Review of systems:    In addition to the HPI above,  No Fever-chills, No Headache, No changes with Vision or hearing, No problems swallowing food or Liquids, No Chest pain, Cough or Shortness of Breath, No Abdominal pain,  No Blood in stool or Urine, No dysuria, No new skin rashes or bruises, No new joints pains-aches,  No new weakness, tingling, numbness in any extremity, No recent weight gain or loss, No polyuria, polydypsia or polyphagia, No significant Mental Stressors.  All other systems reviewed and are negative.    Past History of the following :    Past Medical History:  Diagnosis Date   Arthritis    "qwhere"   Coronary artery disease    Hypercholesteremia    Hypertension    Myocardial infarction (HCC) 2003   SBO (small bowel  obstruction) (HCC) 12/18/2016   Stenosis of subclavian artery (HCC)    Type II diabetes mellitus (HCC)       Past Surgical History:  Procedure Laterality Date   ABDOMINAL HYSTERECTOMY  1970   BOWEL RESECTION N/A 12/20/2016   Procedure: SMALL BOWEL RESECTION;  Surgeon: Abigail MiyamotoBlackman, Douglas, MD;  [facility];  Service: General;  Laterality: N/A;   CARDIAC CATHETERIZATION     CATARACT EXTRACTION Bilateral 2000's   [place]ONARY [place]   LAPAROTOMY N/A 12/20/2016   Procedure: EXPLORATORY LAPAROTOMY;  Surgeon: Abigail MiyamotoBlackman, Douglas, MD;  [facility];  Service: General;  Laterality: N/A;   LEFT HEART CATHETERIZATION WITH CORONARY/GRAFT ANGIOGRAM N/A 09/27/2014   Procedure: LEFT  HEART CATHETERIZATION WITH Isabel CapriceORONARY/GRAFT ANGIOGRAM;  Surgeon: Pamella PertJagadeesh R Ganji, MD;  [facility];  Service: Cardiovascular;  Laterality: N/A;   UNILATERAL UPPER EXTREMEITY ANGIOGRAM N/A 09/27/2014   Procedure: SUBCLAVIAN ARTERIOGRAM ;  Surgeon: Pamella PertJagadeesh R Ganji, MD;  [facility];  Service: Cardiovascular;  Laterality: N/A;   UNILATERAL UPPER EXTREMEITY ANGIOGRAM N/A 11/09/2014   Procedure: Left subclavian stent ;  Surgeon: Yates DecampJay Ganji, MD;  [facility];  Service: Cardiovascular;  Laterality: N/A;   VISCERAL ANGIOGRAM N/A 10/25/2014   Procedure: Laurence SlateSUBCLAVIAN ANGIOGRAM;  Surgeon: Yates DecampJay Ganji, MD;  [facility];  Service: Cardiovascular;  Laterality: N/A;      Social History:      Social History   Tobacco Use   Smoking status: Never Smoker   Smokeless tobacco: Never Used  Substance Use Topics   Alcohol use: No       Family History :     Family History  Problem Relation Age of Onset   Gallbladder disease Neg Hx        Home Medications:   Prior to Admission medications   Medication Sig Start Date End Date Taking? Authorizing Provider  alendronate (FOSAMAX) 70 MG tablet Take 70 mg by mouth every Monday.  12/08/14   [provider]  amLODipine (NORVASC) 10 MG tablet Take 10 mg by mouth at bedtime.  10/12/14   [provider]  aspirin EC 81 MG tablet Take 81 mg by mouth at bedtime.     [provider]  Calcium Citrate-Vitamin D (CALCIUM CITRATE + PO) Take 1 tablet by mouth daily.     [provider]  cephALEXin (KEFLEX) 500 MG capsule Take 1 capsule (500 mg total) by mouth 2 (two) times daily. 12/28/17   Charlynne PanderYao, David Hsienta, MD  gabapentin (NEURONTIN) 300 MG capsule Take 300 mg by mouth at bedtime.  04/09/17   [provider]  hydrALAZINE (APRESOLINE) 25 MG tablet Take 25 mg by mouth daily.  03/21/17   [provider]     Allergies:     Allergies  Allergen Reactions   Statins Other (See  Comments)    myalgia myalgias myalgias Myalgias    Crestor [Rosuvastatin] Other (See Comments)    Myalgias   Lipitor [Atorvastatin] Other (See Comments)    myalgias   Zocor [Simvastatin] Other (See Comments)    myalgias     Physical Exam:   Vitals  Blood pressure (!) 158/56, pulse 77, temperature 98.1 F (36.7 C), temperature source Oral, resp. rate 16, SpO2 94 %.  1.  General: axox3 (person, place , time)  2. Psychiatric: euthymic  3. Neurologic: cn2-12 intact, reflexes 2+ symmetric, diffuse with no clonus, motor 5/5 in all 4 ext,  Pt able to ambulate  4. HEENMT:  Anicteric, pupils 1.58mm symmetric, direct, consensual , near intact eomi Mmm, tongue midline  5. Respiratory : CTAB  6. Cardiovascular : rrr s1, s2,   7. Gastrointestinal:  Abd: soft, nt, nd, +bs  8. Skin:  Ext: no c/c/e, no rash  9.Musculoskeletal:  Good ROM,  No adenopathy    Data Review:    CBC Recent Labs  Lab 12/29/18 1519  WBC 11.5*  HGB 10.8*  HCT 35.3*  PLT 186  MCV 84.9  MCH 26.0  MCHC 30.6  RDW 15.0  LYMPHSABS 1.0  MONOABS 1.0  EOSABS 0.0  BASOSABS 0.0   ------------------------------------------------------------------------------------------------------------------  Results for orders placed or performed during the hospital encounter of 12/29/18 (from the past 48 hour(s))  CBC with Differential/Platelet     Status: Abnormal   Collection Time: 12/29/18  3:19 PM  Result Value Ref Range   WBC 11.5 (H) 4.0 - 10.5 K/uL   RBC 4.16 3.87 - 5.11 MIL/uL   Hemoglobin 10.8 (L) 12.0 - 15.0 g/dL   HCT 65.7 (L) 84.6 - 96.2 %   MCV 84.9 80.0 - 100.0 fL   MCH 26.0 26.0 - 34.0 pg   MCHC 30.6 30.0 - 36.0 g/dL   RDW 95.2 84.1 - 32.4 %   Platelets 186 150 - 400 K/uL   nRBC 0.0 0.0 - 0.2 %   Neutrophils Relative % 84 %   Neutro Abs 9.6 (H) 1.7 - 7.7 K/uL   Lymphocytes Relative 8 %   Lymphs Abs 1.0 0.7 - 4.0 K/uL   Monocytes Relative 8 %   Monocytes Absolute 1.0 0.1 - 1.0  K/uL   Eosinophils Relative 0 %   Eosinophils Absolute 0.0 0.0 - 0.5 K/uL   Basophils Relative 0 %   Basophils Absolute 0.0 0.0 - 0.1 K/uL   Immature Granulocytes 0 %   Abs Immature Granulocytes 0.03 0.00 - 0.07 K/uL    Comment: Performed at [facility], [address], [place], [place] [address]  Comprehensive metabolic panel     Status: Abnormal   Collection Time: 12/29/18  3:19 PM  Result Value Ref Range   Sodium 144 135 - 145 mmol/L   Potassium 4.0 3.5 - 5.1 mmol/L   Chloride 104 98 - 111 mmol/L   CO2 28 22 - 32 mmol/L   Glucose, Bld 195 (H) 70 - 99 mg/dL   BUN 33 (H) 8 - 23 mg/dL   Creatinine, Ser 7.25 (H) 0.44 - 1.00 mg/dL   Calcium 9.6 8.9 - 36.6 mg/dL   Total Protein 7.4 6.5 - 8.1 g/dL   Albumin 3.9 3.5 - 5.0 g/dL   AST 17 15 - 41 U/L   ALT 12 0 - 44 U/L   Alkaline Phosphatase 98 38 - 126 U/L   Total Bilirubin 0.8 0.3 - 1.2 mg/dL   GFR calc non Af Amer 35 (L) >60 mL/min   GFR calc Af Amer 40 (L) >60 mL/min   Anion gap 12 5 - 15    Comment: Performed at [facility], [address] [place], [place] [address]  Lipase, blood     Status: None   Collection Time: 12/29/18  3:19 PM  Result Value Ref Range   Lipase 26 11 - 51 U/L    Comment: Performed at [facility], [address] [place], [place] [address]  Urinalysis, Routine w reflex microscopic     Status: Abnormal   Collection  Time: 12/29/18  4:57 PM  Result Value Ref Range   Color, Urine YELLOW YELLOW   APPearance CLEAR CLEAR   Specific Gravity, Urine 1.013 1.005 - 1.030   pH 8.0 5.0 - 8.0   Glucose, UA NEGATIVE NEGATIVE mg/dL   Hgb urine dipstick NEGATIVE NEGATIVE   Bilirubin Urine NEGATIVE NEGATIVE   Ketones, ur 5 (A) NEGATIVE mg/dL   Protein, ur 833 (A) NEGATIVE mg/dL   Nitrite NEGATIVE NEGATIVE   Leukocytes,Ua NEGATIVE NEGATIVE   RBC / HPF 0-5 0 - 5 RBC/hpf   WBC, UA 0-5 0 - 5 WBC/hpf   Bacteria, UA NONE SEEN NONE SEEN   Squamous Epithelial /  LPF 0-5 0 - 5    Comment: Performed at [facility], [address] [place], [place] [address]    Chemistries  Recent Labs  Lab 12/29/18 1519  NA 144  K 4.0  CL 104  CO2 28  GLUCOSE 195*  BUN 33*  CREATININE 1.34*  CALCIUM 9.6  AST 17  ALT 12  ALKPHOS 98  BILITOT 0.8   ------------------------------------------------------------------------------------------------------------------  ------------------------------------------------------------------------------------------------------------------ GFR: CrCl cannot be calculated (Unknown ideal weight.). Liver Function Tests: Recent Labs  Lab 12/29/18 1519  AST 17  ALT 12  ALKPHOS 98  BILITOT 0.8  PROT 7.4  ALBUMIN 3.9   Recent Labs  Lab 12/29/18 1519  LIPASE 26   No results for input(s): AMMONIA in the last 168 hours. Coagulation Profile: No results for input(s): INR, PROTIME in the last 168 hours. Cardiac Enzymes: No results for input(s): CKTOTAL, CKMB, CKMBINDEX, TROPONINI in the last 168 hours. BNP (last 3 results) No results for input(s): PROBNP in the last 8760 hours. HbA1C: No results for input(s): HGBA1C in the last 72 hours. CBG: No results for input(s): GLUCAP in the last 168 hours. Lipid Profile: No results for input(s): CHOL, HDL, LDLCALC, TRIG, CHOLHDL, LDLDIRECT in the last 72 hours. Thyroid Function Tests: No results for input(s): TSH, T4TOTAL, FREET4, T3FREE, THYROIDAB in the last 72 hours. Anemia Panel: No results for input(s): VITAMINB12, FOLATE, FERRITIN, TIBC, IRON, RETICCTPCT in the last 72 hours.  --------------------------------------------------------------------------------------------------------------- Urine analysis:    Component Value Date/Time   COLORURINE YELLOW 12/29/2018 1657   APPEARANCEUR CLEAR 12/29/2018 1657   LABSPEC 1.013 12/29/2018 1657   PHURINE 8.0 12/29/2018 1657   GLUCOSEU NEGATIVE 12/29/2018 1657   HGBUR NEGATIVE 12/29/2018 1657    BILIRUBINUR NEGATIVE 12/29/2018 1657   KETONESUR 5 (A) 12/29/2018 1657   PROTEINUR 100 (A) 12/29/2018 1657   UROBILINOGEN 0.2 06/03/2013 1938   NITRITE NEGATIVE 12/29/2018 1657   LEUKOCYTESUR NEGATIVE 12/29/2018 1657      Imaging Results:    Ct Abdomen Pelvis W Contrast  Result Date: 12/29/2018 CLINICAL DATA:  Reduced oral intake with nausea and vomiting since last night at about 4 a.m. EXAM: CT ABDOMEN AND PELVIS WITH CONTRAST TECHNIQUE: Multidetector CT imaging of the abdomen and pelvis was performed using the standard protocol following bolus administration of intravenous contrast. CONTRAST:  35mL OMNIPAQUE IOHEXOL 300 MG/ML  SOLN COMPARISON:  10/20/2017 and radiographs from 12/29/2018 FINDINGS: Lower chest: Distended and thick-walled esophagus. Possible paraesophageal edema. Atherosclerotic calcification of the thoracic aorta and coronary arteries with mild cardiomegaly. Trace bilateral pleural effusions. Prominent pulmonary venous structures. Hepatobiliary: Notable gallbladder wall thickening with complex gallstone including some nitrogen gas phenomenon, the presumed gallstone measures about 8.0 by 3.7 cm. Stable mild intrahepatic and extrahepatic biliary dilatation. Pancreas: Mostly atrophic pancreas. Dorsal pancreatic duct dilatation in part of the pancreatic  head. Spleen: 1.9 cm hypodense splenic lesion is technically nonspecific on image 20/2, and measured about 0.6 cm on 12/18/2016. Adrenals/Urinary Tract: Both adrenal glands appear normal. Small hypodense left renal lesions are statistically likely to be small cysts but technically nonspecific. Stomach/Bowel: Proximal small bowel obstruction in the left lower quadrant along what is likely an angulated loop of bowel with transition point shown about on image 48/5. Proximal to this the loops of small bowel measure up to 5.4 cm in diameter, all distal bowel is normal caliber. There is notable sigmoid colon diverticulosis without active  diverticulitis. Postoperative findings in the right small bowel. Distended stomach. Vascular/Lymphatic: Aortoiliac atherosclerotic vascular disease. 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 Reproductive: Uterus absent.  Adnexa unremarkable. Other: Small amount of free fluid along the right paracolic gutter region. Low-level edema the omentum. Musculoskeletal: 7 mm of degenerative anterolisthesis at L4-5 with resulting bilateral foraminal impingement. IMPRESSION: 1. Small bowel obstruction in the left lower quadrant, with distended stomach and distended small bowel extending down to what appears to probably be an angulated loop of small bowel, query adhesion. There is a trace amount of free fluid especially in the right paracolic gutter. 2. Thick-walled gallbladder with chronic very large gallstone measuring up to 8 cm in diameter. Acute cholecystitis is not excluded. 3. Distended and thick-walled esophagus. Although the distension may be from the distal obstruction, wall thickening suggests esophagitis. 4. Trace bilateral pleural effusions.  Distended pulmonary veins. 5.  Aortic Atherosclerosis (ICD10-I70.0).  Coronary atherosclerosis. 6. Mild chronic intrahepatic and extrahepatic biliary dilatation. Stable. Dilated dorsal pancreatic duct in the pancreatic head. 7. Nonspecific hypodense lesion in the spleen has enlarged compared to 12/18/2016. 8. Sigmoid colon diverticulosis. 9. 7 mm of degenerative anterolisthesis at L4-5 resulting in bilateral foraminal impingement at this level. Electronically Signed   By: Gaylyn Rong M.D.   On: 12/29/2018 18:08   Dg Abdomen Acute W/chest  Result Date: 12/29/2018 CLINICAL DATA:  Nausea and vomiting.  Hypertension. EXAM: DG ABDOMEN ACUTE W/ 1V CHEST COMPARISON:  Chest radiograph Dec 28, 2017; abdomen radiograph October 22, 2017 FINDINGS: PA chest: No edema or consolidation. There is mild scarring the left apex. Heart size and  pulmonary vascular normal. No adenopathy. There is aortic atherosclerosis. Patient is status post median sternotomy. Supine and upright abdomen: There is no bowel dilatation. There are scattered air-fluid levels. No free air. There are vascular calcifications in each common and external iliac artery. Postoperative changes noted in the right abdomen. IMPRESSION: Multiple air-fluid levels which may indicate enteritis or ileus. A degree of bowel obstruction is a consideration given this appearance. No bowel dilatation is noted, however with most loops of bowel fluid-filled. No free air. No edema or consolidation. Status post median sternotomy. Aortic Atherosclerosis (ICD10-I70.0). Electronically Signed   By: Bretta Bang III M.D.   On: 12/29/2018 16:49       Assessment & Plan:    Principal Problem:   SBO (small bowel obstruction) (HCC) Active Problems:   HTN (hypertension)   DM (diabetes mellitus) (HCC)   CAD (coronary artery disease)   AKI (acute kidney injury) (HCC)  SBO Check cbc in am NPO NS iv NGT to low intermittent suction General surgery consulted by ED, per Dr. Loren Racer Appreciate input   Mild AKI Hydrate with ns iv Check cmp in am  Hypertension STOP oral amlodipine, hydralazine Hydralazine  iv tid Hydralazine  iv q6h prn sbp >160   CAD s/p CABG, L  subclavian stenosis Check Lipid Cont Aspirin daily  ? H/o Dm2 Check hga1c fsbs q4h, ISS  Osteoporosis Hold Fosamax  Not sure why on Gabapentin but we will hold this at this time.     DVT Prophylaxis-   Lovenox - SCDs   AM Labs Ordered, also please review Full Orders  Family Communication: Admission, patients condition and plan of care including tests being ordered have been discussed with the patient who indicate understanding and agree with the plan and Code Status.  Code Status:  FULL CODE per patient and husband  Admission status: Observation/Inpatient: Based on patients clinical presentation  and evaluation of above clinical data, I have made determination that patient meets Inpatient criteria at this time.  Pt will require admission for SBO, and iv hydration, and iv pain control and iv zofran for n/v.  Her SBO took 7 days to apparently resolve last time, thus will likely require inpatient admission.   Time spent in minutes : 70   Pearson Grippe M.D on 12/29/2018 at 6:56 PM

## 2018-12-29 NOTE — ED Triage Notes (Signed)
EMS reports from home, family called for N/V this morning and stated prior GI issues. Pt denies Nausea on arrival.  BP 168/64 HR 72 RR 18 Sp02 95 RA CBG 223  20 Left Forearm

## 2018-12-29 NOTE — ED Notes (Signed)
ED TO INPATIENT HANDOFF REPORT  ED Nurse Name and Phone #: [place] G, [phone]  S Name/Age/Gender Sonya Chavez 85 y.o. female Room/Bed: WA15/WA15  Code Status   Code Status: Full Code  Home/SNF/Other Home Patient oriented to: self, place and time Is this baseline? Yes   Triage Complete: Triage complete  Chief Complaint N/V  Triage Note EMS reports from home, family called for N/V this morning and stated prior GI issues. Pt denies Nausea on arrival.  BP 168/64 HR 72 RR 18 Sp02 95 RA CBG 223  20 Left Forearm   Allergies Allergies  Allergen Reactions  . Statins Other (See Comments)    myalgia myalgias myalgias Myalgias   . Crestor [Rosuvastatin] Other (See Comments)    Myalgias  . Lipitor [Atorvastatin] Other (See Comments)    myalgias  . Zocor [Simvastatin] Other (See Comments)    myalgias    Level of Care/Admitting Diagnosis ED Disposition    ED Disposition Condition Comment   Admit  [facility] [[id number]]  Level of Care: Med-Surg [16]  Covid Evaluation: Person Under Investigation (PUI)  Isolation Risk Level: Low Risk/Droplet (Less than 4L [place] supplementation)  Diagnosis: SBO (small bowel obstruction) [facility](HCC) [[id number]]) [[id number]]  Admitting Physician: Pearson GrippeKIM, JAMES [phone][[id number]]  Attending Physician: Pearson GrippeKIM, JAMES [phone][[id number]]  Estimated length of stay: past midnight tomorrow  Certification:: I certify this patient will need inpatient services for at least 2 midnights  PT Class (Do Not Modify): Inpatient [101]  PT Acc Code (Do Not Modify): Private [1]       B Medical/Surgery History Past Medical History:  Diagnosis Date  . Arthritis    "qwhere"  . Coronary artery disease   . Hypercholesteremia   . Hypertension   . Myocardial infarction (HCC) 2003  . SBO (small bowel obstruction) (HCC) 12/18/2016  . Stenosis of subclavian artery (HCC)   . Type II diabetes mellitus (HCC)    Past Surgical History:  Procedure Laterality Date  .  ABDOMINAL HYSTERECTOMY  1970  . BOWEL RESECTION N/A 12/20/2016   Procedure: SMALL BOWEL RESECTION;  Surgeon: Abigail MiyamotoBlackman, Douglas, MD;  Location: [facility] OR;  Service: General;  Laterality: N/A;  . CARDIAC CATHETERIZATION    . CATARACT EXTRACTION Bilateral 2000's  . CORONARY [place]  [place]  . LAPAROTOMY N/A 12/20/2016   Procedure: EXPLORATORY LAPAROTOMY;  Surgeon: Abigail MiyamotoBlackman, Douglas, MD;  [facility];  Service: General;  Laterality: N/A;  . LEFT HEART CATHETERIZATION WITH CORONARY/GRAFT ANGIOGRAM N/A 09/27/2014   Procedure: LEFT HEART CATHETERIZATION WITH Isabel CapriceORONARY/GRAFT ANGIOGRAM;  Surgeon: Pamella PertJagadeesh R Ganji, MD;  [facility];  Service: Cardiovascular;  Laterality: N/A;  . UNILATERAL UPPER EXTREMEITY ANGIOGRAM N/A 09/27/2014   Procedure: SUBCLAVIAN ARTERIOGRAM ;  Surgeon: Pamella PertJagadeesh R Ganji, MD;  [facility];  Service: Cardiovascular;  Laterality: N/A;  . UNILATERAL UPPER EXTREMEITY ANGIOGRAM N/A 11/09/2014   Procedure: Left subclavian stent ;  Surgeon: Yates DecampJay Ganji, MD;  [facility];  Service: Cardiovascular;  Laterality: N/A;  . VISCERAL ANGIOGRAM N/A 10/25/2014   Procedure: Laurence SlateSUBCLAVIAN ANGIOGRAM;  Surgeon: Yates DecampJay Ganji, MD;  [facility];  Service: Cardiovascular;  Laterality: N/A;     A IV Location/Drains/Wounds Patient Lines/Drains/Airways Status   Active Line/Drains/Airways    Name:   Placement date:   Placement time:   Site:   Days:   Peripheral IV 12/29/18 Left Forearm   12/29/18    1408    Forearm  less than 1   External Urinary Catheter   12/29/18    1527    -   less than 1   Incision (Closed) 12/20/16 Abdomen Other (Comment)   12/20/16    1324     739          Intake/Output Last 24 hours  Intake/Output Summary (Last 24 hours) at 12/29/2018 1928 Last data filed at 12/29/2018 1713 Gross per 24 hour  Intake 1000 ml  Output -  Net 1000 ml    Labs/Imaging Results for orders placed or performed during the hospital  encounter of 12/29/18 (from the past 48 hour(s))  CBC with Differential/Platelet     Status: Abnormal   Collection Time: 12/29/18  3:19 PM  Result Value Ref Range   WBC 11.5 (H) 4.0 - 10.5 K/uL   RBC 4.16 3.87 - 5.11 MIL/uL   Hemoglobin 10.8 (L) 12.0 - 15.0 g/dL   HCT 02.7 (L) 25.3 - 66.4 %   MCV 84.9 80.0 - 100.0 fL   MCH 26.0 26.0 - 34.0 pg   MCHC 30.6 30.0 - 36.0 g/dL   RDW 40.3 47.4 - 25.9 %   Platelets 186 150 - 400 K/uL   nRBC 0.0 0.0 - 0.2 %   Neutrophils Relative % 84 %   Neutro Abs 9.6 (H) 1.7 - 7.7 K/uL   Lymphocytes Relative 8 %   Lymphs Abs 1.0 0.7 - 4.0 K/uL   Monocytes Relative 8 %   Monocytes Absolute 1.0 0.1 - 1.0 K/uL   Eosinophils Relative 0 %   Eosinophils Absolute 0.0 0.0 - 0.5 K/uL   Basophils Relative 0 %   Basophils Absolute 0.0 0.0 - 0.1 K/uL   Immature Granulocytes 0 %   Abs Immature Granulocytes 0.03 0.00 - 0.07 K/uL    Comment: Performed at [facility], [address] [place], [place] [address]  Comprehensive metabolic panel     Status: Abnormal   Collection Time: 12/29/18  3:19 PM  Result Value Ref Range   Sodium 144 135 - 145 mmol/L   Potassium 4.0 3.5 - 5.1 mmol/L   Chloride 104 98 - 111 mmol/L   CO2 28 22 - 32 mmol/L   Glucose, Bld 195 (H) 70 - 99 mg/dL   BUN 33 (H) 8 - 23 mg/dL   Creatinine, Ser 5.64 (H) 0.44 - 1.00 mg/dL   Calcium 9.6 8.9 - 33.2 mg/dL   Total Protein 7.4 6.5 - 8.1 g/dL   Albumin 3.9 3.5 - 5.0 g/dL   AST 17 15 - 41 U/L   ALT 12 0 - 44 U/L   Alkaline Phosphatase 98 38 - 126 U/L   Total Bilirubin 0.8 0.3 - 1.2 mg/dL   GFR calc non Af Amer 35 (L) >60 mL/min   GFR calc Af Amer 40 (L) >60 mL/min   Anion gap 12 5 - 15    Comment: Performed at [facility], [address] [place], [place] [address]  Lipase, blood     Status: None   Collection Time: 12/29/18  3:19 PM  Result Value Ref Range   Lipase 26 11 - 51 U/L    Comment: Performed at [facility], [address] [place], [place] [address]  Urinalysis, Routine w reflex microscopic     Status: Abnormal   Collection Time: 12/29/18  4:57 PM  Result Value Ref Range   Color, Urine YELLOW YELLOW   APPearance CLEAR CLEAR  Specific Gravity, Urine 1.013 1.005 - 1.030   pH 8.0 5.0 - 8.0   Glucose, UA NEGATIVE NEGATIVE mg/dL   Hgb urine dipstick NEGATIVE NEGATIVE   Bilirubin Urine NEGATIVE NEGATIVE   Ketones, ur 5 (A) NEGATIVE mg/dL   Protein, ur 161 (A) NEGATIVE mg/dL   Nitrite NEGATIVE NEGATIVE   Leukocytes,Ua NEGATIVE NEGATIVE   RBC / HPF 0-5 0 - 5 RBC/hpf   WBC, UA 0-5 0 - 5 WBC/hpf   Bacteria, UA NONE SEEN NONE SEEN   Squamous Epithelial / LPF 0-5 0 - 5    Comment: Performed at [facility], [address], [place], [place] [address]   Ct Abdomen Pelvis W Contrast  Result Date: 12/29/2018 CLINICAL DATA:  Reduced oral intake with nausea and vomiting since last night at about 4 a.m. EXAM: CT ABDOMEN AND PELVIS WITH CONTRAST TECHNIQUE: Multidetector CT imaging of the abdomen and pelvis was performed using the standard protocol following bolus administration of intravenous contrast. CONTRAST:  75mL OMNIPAQUE IOHEXOL 300 MG/ML  SOLN COMPARISON:  10/20/2017 and radiographs from 12/29/2018 FINDINGS: Lower chest: Distended and thick-walled esophagus. Possible paraesophageal edema. Atherosclerotic calcification of the thoracic aorta and coronary arteries with mild cardiomegaly. Trace bilateral pleural effusions. Prominent pulmonary venous structures. Hepatobiliary: Notable gallbladder wall thickening with complex gallstone including some nitrogen gas phenomenon, the presumed gallstone measures about 8.0 by 3.7 cm. Stable mild intrahepatic and extrahepatic biliary dilatation. Pancreas: Mostly atrophic pancreas. Dorsal pancreatic duct dilatation in part of the pancreatic head. Spleen: 1.9 cm hypodense splenic lesion is technically nonspecific on image 20/2, and measured about 0.6 cm on  12/18/2016. Adrenals/Urinary Tract: Both adrenal glands appear normal. Small hypodense left renal lesions are statistically likely to be small cysts but technically nonspecific. Stomach/Bowel: Proximal small bowel obstruction in the left lower quadrant along what is likely an angulated loop of bowel with transition point shown about on image 48/5. Proximal to this the loops of small bowel measure up to 5.4 cm in diameter, all distal bowel is normal caliber. There is notable sigmoid colon diverticulosis without active diverticulitis. Postoperative findings in the right small bowel. Distended stomach. Vascular/Lymphatic: Aortoiliac atherosclerotic vascular disease. 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 Reproductive: Uterus absent.  Adnexa unremarkable. Other: Small amount of free fluid along the right paracolic gutter region. Low-level edema the omentum. Musculoskeletal: 7 mm of degenerative anterolisthesis at L4-5 with resulting bilateral foraminal impingement. IMPRESSION: 1. Small bowel obstruction in the left lower quadrant, with distended stomach and distended small bowel extending down to what appears to probably be an angulated loop of small bowel, query adhesion. There is a trace amount of free fluid especially in the right paracolic gutter. 2. Thick-walled gallbladder with chronic very large gallstone measuring up to 8 cm in diameter. Acute cholecystitis is not excluded. 3. Distended and thick-walled esophagus. Although the distension may be from the distal obstruction, wall thickening suggests esophagitis. 4. Trace bilateral pleural effusions.  Distended pulmonary veins. 5.  Aortic Atherosclerosis (ICD10-I70.0).  Coronary atherosclerosis. 6. Mild chronic intrahepatic and extrahepatic biliary dilatation. Stable. Dilated dorsal pancreatic duct in the pancreatic head. 7. Nonspecific hypodense lesion in the spleen has enlarged compared to 12/18/2016. 8. Sigmoid colon  diverticulosis. 9. 7 mm of degenerative anterolisthesis at L4-5 resulting in bilateral foraminal impingement at this level. Electronically Signed   By: Gaylyn Rong M.D.   On: 12/29/2018 18:08   Dg Abdomen Acute W/chest  Result Date: 12/29/2018 CLINICAL DATA:  Nausea  and vomiting.  Hypertension. EXAM: DG ABDOMEN ACUTE W/ 1V CHEST COMPARISON:  Chest radiograph Dec 28, 2017; abdomen radiograph October 22, 2017 FINDINGS: PA chest: No edema or consolidation. There is mild scarring the left apex. Heart size and pulmonary vascular normal. No adenopathy. There is aortic atherosclerosis. Patient is status post median sternotomy. Supine and upright abdomen: There is no bowel dilatation. There are scattered air-fluid levels. No free air. There are vascular calcifications in each common and external iliac artery. Postoperative changes noted in the right abdomen. IMPRESSION: Multiple air-fluid levels which may indicate enteritis or ileus. A degree of bowel obstruction is a consideration given this appearance. No bowel dilatation is noted, however with most loops of bowel fluid-filled. No free air. No edema or consolidation. Status post median sternotomy. Aortic Atherosclerosis (ICD10-I70.0). Electronically Signed   By: Bretta Bang III M.D.   On: 12/29/2018 16:49    Pending Labs Unresulted Labs (From admission, onward)    Start     Ordered   01/05/19 0500  Creatinine, serum  (enoxaparin (LOVENOX)    CrCl >/= 30 ml/min)  Weekly,   R    Comments:  while on enoxaparin therapy    12/29/18 1854   12/30/18 0500  Comprehensive metabolic panel  Tomorrow morning,   R     12/29/18 1854   12/30/18 0500  CBC  Tomorrow morning,   R     12/29/18 1854   12/30/18 0500  Hemoglobin A1c  Tomorrow morning,   R     12/29/18 1855   12/29/18 1814  SARS Coronavirus 2 (CEPHEID - Performed in [facility] hospital lab), Hosp Order  (Asymptomatic Patients Labs)  Once,   R    Question:  Rule Out  Answer:  Yes   12/29/18 1813           Vitals/Pain Today's Vitals   12/29/18 1600 12/29/18 1700 12/29/18 1726 12/29/18 1842  BP: (!) 159/49  (!) 159/49 (!) 158/56  Pulse: 65 73 73 77  Resp: 17 16 16 16   Temp:      TempSrc:      SpO2: (!) 88% 91% 91% 94%  PainSc:        Isolation Precautions No active isolations  Medications Medications  sodium chloride (PF) 0.9 % injection (has no administration in time range)  enoxaparin (LOVENOX) injection 40 mg (has no administration in time range)  0.9 %  sodium chloride infusion (has no administration in time range)  acetaminophen (TYLENOL) tablet 650 mg (has no administration in time range)    Or  acetaminophen (TYLENOL) suppository 650 mg (has no administration in time range)  ondansetron (ZOFRAN) injection 4 mg (has no administration in time range)  HYDROmorphone (DILAUDID) injection 1 mg (has no administration in time range)  insulin aspart (novoLOG) injection 0-9 Units (has no administration in time range)  ondansetron (ZOFRAN) injection 4 mg (4 mg Intravenous Given 12/29/18 1527)  sodium chloride 0.9 % bolus 1,000 mL (0 mLs Intravenous Stopped 12/29/18 1713)  iohexol (OMNIPAQUE) 300 MG/ML solution 75 mL (75 mLs Intravenous Contrast Given 12/29/18 1732)    Mobility non-ambulatory Low fall risk

## 2018-12-29 NOTE — ED Notes (Addendum)
Patient vomitied about 150 mL of brown, feces-smelling bile

## 2018-12-30 ENCOUNTER — Inpatient Hospital Stay (HOSPITAL_COMMUNITY): Payer: Medicare Other

## 2018-12-30 LAB — CBC
HCT: 33.6 % — ABNORMAL LOW (ref 36.0–46.0)
Hemoglobin: 9.9 g/dL — ABNORMAL LOW (ref 12.0–15.0)
MCH: 25.8 pg — ABNORMAL LOW (ref 26.0–34.0)
MCHC: 29.5 g/dL — ABNORMAL LOW (ref 30.0–36.0)
MCV: 87.7 fL (ref 80.0–100.0)
Platelets: 164 10*3/uL (ref 150–400)
RBC: 3.83 MIL/uL — ABNORMAL LOW (ref 3.87–5.11)
RDW: 15.4 % (ref 11.5–15.5)
WBC: 14.4 10*3/uL — ABNORMAL HIGH (ref 4.0–10.5)
nRBC: 0 % (ref 0.0–0.2)

## 2018-12-30 LAB — COMPREHENSIVE METABOLIC PANEL
ALT: 10 U/L (ref 0–44)
AST: 13 U/L — ABNORMAL LOW (ref 15–41)
Albumin: 3.4 g/dL — ABNORMAL LOW (ref 3.5–5.0)
Alkaline Phosphatase: 74 U/L (ref 38–126)
Anion gap: 9 (ref 5–15)
BUN: 34 mg/dL — ABNORMAL HIGH (ref 8–23)
CO2: 26 mmol/L (ref 22–32)
Calcium: 8.8 mg/dL — ABNORMAL LOW (ref 8.9–10.3)
Chloride: 108 mmol/L (ref 98–111)
Creatinine, Ser: 1.31 mg/dL — ABNORMAL HIGH (ref 0.44–1.00)
GFR calc Af Amer: 41 mL/min — ABNORMAL LOW (ref >60)
GFR calc non Af Amer: 36 mL/min — ABNORMAL LOW (ref >60)
Glucose, Bld: 145 mg/dL — ABNORMAL HIGH (ref 70–99)
Potassium: 3.7 mmol/L (ref 3.5–5.1)
Sodium: 143 mmol/L (ref 135–145)
Total Bilirubin: 0.7 mg/dL (ref 0.3–1.2)
Total Protein: 6.4 g/dL — ABNORMAL LOW (ref 6.5–8.1)

## 2018-12-30 LAB — HEMOGLOBIN A1C
Hgb A1c MFr Bld: 7.5 % — ABNORMAL HIGH (ref 4.8–5.6)
Mean Plasma Glucose: 168.55 mg/dL

## 2018-12-30 LAB — GLUCOSE, CAPILLARY
Glucose-Capillary: 135 mg/dL — ABNORMAL HIGH (ref 70–99)
Glucose-Capillary: 142 mg/dL — ABNORMAL HIGH (ref 70–99)
Glucose-Capillary: 144 mg/dL — ABNORMAL HIGH (ref 70–99)
Glucose-Capillary: 153 mg/dL — ABNORMAL HIGH (ref 70–99)
Glucose-Capillary: 155 mg/dL — ABNORMAL HIGH (ref 70–99)
Glucose-Capillary: 166 mg/dL — ABNORMAL HIGH (ref 70–99)

## 2018-12-30 MED ORDER — HALOPERIDOL LACTATE 5 MG/ML IJ SOLN
1.0000 mg | Freq: Four times a day (QID) | INTRAMUSCULAR | Status: DC | PRN
  Administered 2018-12-30 – 2019-01-04 (×7): 1 mg via INTRAVENOUS
  Filled 2018-12-30 (×10): qty 1

## 2018-12-30 MED ORDER — ASPIRIN EC 81 MG PO TBEC
81.0000 mg | DELAYED_RELEASE_TABLET | Freq: Every day | ORAL | Status: DC
  Administered 2019-01-02 – 2019-01-04 (×3): 81 mg via ORAL
  Filled 2018-12-30 (×5): qty 1

## 2018-12-30 MED ORDER — HALOPERIDOL LACTATE 5 MG/ML IJ SOLN
1.0000 mg | Freq: Once | INTRAMUSCULAR | Status: AC
  Administered 2018-12-30: 1 mg via INTRAVENOUS
  Filled 2018-12-30: qty 1

## 2018-12-30 NOTE — Progress Notes (Signed)
PROGRESS NOTE    [place] M Renk  GGE:[id number] DOB: [birth year] DOA: 12/29/2018 PCP: Alysia Penna, MD   Brief Narrative:  Sonya Chavez is a 85 year old pleasant female with a history of hypertension hyperlipidemia, type 2 diabetes mellitus, CAD status post CABG and history of hysterectomy and prior small bowel obstruction in March 2020 presented to ED on 12/29/2018 with a complaint of nausea, vomiting which started early a.m.  She did not have any abdominal pain, diarrhea, chills, fever, hematuria, dysuria or any other complaint.  Further work-up in the emergency department included CT of the abdomen and pelvis which revealed small bowel obstruction in left lower quadrant and possible additions.  Thick-walled gallbladder with large gallstone and possible acute cholecystitis.  She was subsequently admitted to hospital service and general surgery was consulted.  Consultants:   General surgery  Procedures:   None  Antimicrobials:   None   Subjective: Patient seen and examined.  She was slightly confused however she denied any abdominal pain.  Objective: Vitals:   12/29/18 2143 12/30/18 0401 12/30/18 0759 12/30/18 0801  BP: (!) 164/71 (!) 157/61  (!) 157/61  Pulse: 71 67  80  Resp: 19 20  20   Temp:  97.9 F (36.6 C)  98 F (36.7 C)  TempSrc:    Oral  SpO2: 90% 96% (!) 82% 93%  Weight:  57.9 kg  57.9 kg  Height:    5' 2.01" (1.575 m)    Intake/Output Summary (Last 24 hours) at 12/30/2018 1256 Last data filed at 12/30/2018 0800 Gross per 24 hour  Intake 1915.65 ml  Output 2050 ml  Net -134.35 ml   Filed Weights   12/30/18 0401 12/30/18 0801  Weight: 57.9 kg 57.9 kg    Examination:  General exam: Appears calm and comfortable  Respiratory system: Clear to auscultation. Respiratory effort normal. Cardiovascular system: S1 & S2 heard, RRR. No JVD, murmurs, rubs, gallops or clicks. No pedal edema. Gastrointestinal system: Abdomen is nondistended, soft  and nontender. No organomegaly or masses felt.  Diminished bowel sounds. Central nervous system: Alert and orientedx1. No focal neurological deficits. Extremities: Symmetric 5 x 5 power. Skin: No rashes, lesions or ulcers Psychiatry: Judgement and insight appear poor, mood & affect flat.   Data Reviewed: I have personally reviewed following labs and imaging studies  CBC: Recent Labs  Lab 12/29/18 1519 12/30/18 0557  WBC 11.5* 14.4*  NEUTROABS 9.6*  --   HGB 10.8* 9.9*  HCT 35.3* 33.6*  MCV 84.9 87.7  PLT 186 164   Basic Metabolic Panel: Recent Labs  Lab 12/29/18 1519 12/30/18 0557  NA 144 143  K 4.0 3.7  CL 104 108  CO2 28 26  GLUCOSE 195* 145*  BUN 33* 34*  CREATININE 1.34* 1.31*  CALCIUM 9.6 8.8*   GFR: Estimated Creatinine Clearance: 22.1 mL/min (A) (by C-G formula based on SCr of 1.31 mg/dL (H)). Liver Function Tests: Recent Labs  Lab 12/29/18 1519 12/30/18 0557  AST 17 13*  ALT 12 10  ALKPHOS 98 74  BILITOT 0.8 0.7  PROT 7.4 6.4*  ALBUMIN 3.9 3.4*   Recent Labs  Lab 12/29/18 1519  LIPASE 26   No results for input(s): AMMONIA in the last 168 hours. Coagulation Profile: No results for input(s): INR, PROTIME in the last 168 hours. Cardiac Enzymes: No results for input(s): CKTOTAL, CKMB, CKMBINDEX, TROPONINI in the last 168 hours. BNP (last 3 results) No results for input(s): PROBNP in the last 8760 hours. HbA1C: Recent Labs  12/30/18 0557  HGBA1C 7.5*   CBG: Recent Labs  Lab 12/29/18 2357 12/30/18 0358 12/30/18 0727 12/30/18 1147  GLUCAP 164* 142* 135* 155*   Lipid Profile: No results for input(s): CHOL, HDL, LDLCALC, TRIG, CHOLHDL, LDLDIRECT in the last 72 hours. Thyroid Function Tests: No results for input(s): TSH, T4TOTAL, FREET4, T3FREE, THYROIDAB in the last 72 hours. Anemia Panel: No results for input(s): VITAMINB12, FOLATE, FERRITIN, TIBC, IRON, RETICCTPCT in the last 72 hours. Sepsis Labs: No results for input(s):  PROCALCITON, LATICACIDVEN in the last 168 hours.  Recent Results (from the past 240 hour(s))  SARS Coronavirus 2 (CEPHEID - Performed in [facility] hospital lab), Hosp Order     Status: None   Collection Time: 12/29/18  6:14 PM  Result Value Ref Range Status   SARS Coronavirus 2 NEGATIVE NEGATIVE Final    Comment: (NOTE) If result is NEGATIVE SARS-CoV-2 target nucleic acids are NOT DETECTED. The SARS-CoV-2 RNA is generally detectable in upper and lower  respiratory specimens during the acute phase of infection. The lowest  concentration of SARS-CoV-2 viral copies this assay can detect is 250  copies / mL. A negative result does not preclude SARS-CoV-2 infection  and should not be used as the sole basis for treatment or other  patient management decisions.  A negative result may occur with  improper specimen collection / handling, submission of specimen other  than nasopharyngeal swab, presence of viral mutation(s) within the  areas targeted by this assay, and inadequate number of viral copies  (<250 copies / mL). A negative result must be combined with clinical  observations, patient history, and epidemiological information. If result is POSITIVE SARS-CoV-2 target nucleic acids are DETECTED. The SARS-CoV-2 RNA is generally detectable in upper and lower  respiratory specimens dur ing the acute phase of infection.  Positive  results are indicative of active infection with SARS-CoV-2.  Clinical  correlation with patient history and other diagnostic information is  necessary to determine patient infection status.  Positive results do  not rule out bacterial infection or co-infection with other viruses. If result is PRESUMPTIVE POSTIVE SARS-CoV-2 nucleic acids MAY BE PRESENT.   A presumptive positive result was obtained on the submitted specimen  and confirmed on repeat testing.  While 2019 novel coronavirus  (SARS-CoV-2) nucleic acids may be present in the submitted sample  additional  confirmatory testing may be necessary for epidemiological  and / or clinical management purposes  to differentiate between  SARS-CoV-2 and other Sarbecovirus currently known to infect humans.  If clinically indicated additional testing with an alternate test  methodology [phone]) is advised. The SARS-CoV-2 RNA is generally  detectable in upper and lower respiratory sp ecimens during the acute  phase of infection. The expected result is Negative. Fact Sheet for Patients:  BoilerBrush.com.cy Fact Sheet for Healthcare Providers: https://pope.com/ This test is not yet approved or cleared by the [place] FDA and has been authorized for detection and/or diagnosis of SARS-CoV-2 by FDA under an Emergency Use Authorization (EUA).  This EUA will remain in effect (meaning this test can be used) for the duration of the COVID-19 declaration under Section 564(b)(1) of the Act, 21 U.S.C. section 360bbb-3(b)(1), unless the authorization is terminated or revoked sooner. Performed at [facility], [address], [place], [place] [address]       Radiology Studies: Ct Abdomen Pelvis W Contrast  Result Date: 12/29/2018 CLINICAL DATA:  Reduced oral intake with nausea and vomiting since last night at about 4  a.m. EXAM: CT ABDOMEN AND PELVIS WITH CONTRAST TECHNIQUE: Multidetector CT imaging of the abdomen and pelvis was performed using the standard protocol following bolus administration of intravenous contrast. CONTRAST:  75mL OMNIPAQUE IOHEXOL 300 MG/ML  SOLN COMPARISON:  10/20/2017 and radiographs from 12/29/2018 FINDINGS: Lower chest: Distended and thick-walled esophagus. Possible paraesophageal edema. Atherosclerotic calcification of the thoracic aorta and coronary arteries with mild cardiomegaly. Trace bilateral pleural effusions. Prominent pulmonary venous structures. Hepatobiliary: Notable gallbladder wall thickening with complex  gallstone including some nitrogen gas phenomenon, the presumed gallstone measures about 8.0 by 3.7 cm. Stable mild intrahepatic and extrahepatic biliary dilatation. Pancreas: Mostly atrophic pancreas. Dorsal pancreatic duct dilatation in part of the pancreatic head. Spleen: 1.9 cm hypodense splenic lesion is technically nonspecific on image 20/2, and measured about 0.6 cm on 12/18/2016. Adrenals/Urinary Tract: Both adrenal glands appear normal. Small hypodense left renal lesions are statistically likely to be small cysts but technically nonspecific. Stomach/Bowel: Proximal small bowel obstruction in the left lower quadrant along what is likely an angulated loop of bowel with transition point shown about on image 48/5. Proximal to this the loops of small bowel measure up to 5.4 cm in diameter, all distal bowel is normal caliber. There is notable sigmoid colon diverticulosis without active diverticulitis. Postoperative findings in the right small bowel. Distended stomach. Vascular/Lymphatic: Aortoiliac atherosclerotic vascular disease. 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 Reproductive: Uterus absent.  Adnexa unremarkable. Other: Small amount of free fluid along the right paracolic gutter region. Low-level edema the omentum. Musculoskeletal: 7 mm of degenerative anterolisthesis at L4-5 with resulting bilateral foraminal impingement. IMPRESSION: 1. Small bowel obstruction in the left lower quadrant, with distended stomach and distended small bowel extending down to what appears to probably be an angulated loop of small bowel, query adhesion. There is a trace amount of free fluid especially in the right paracolic gutter. 2. Thick-walled gallbladder with chronic very large gallstone measuring up to 8 cm in diameter. Acute cholecystitis is not excluded. 3. Distended and thick-walled esophagus. Although the distension may be from the distal obstruction, wall thickening suggests  esophagitis. 4. Trace bilateral pleural effusions.  Distended pulmonary veins. 5.  Aortic Atherosclerosis (ICD10-I70.0).  Coronary atherosclerosis. 6. Mild chronic intrahepatic and extrahepatic biliary dilatation. Stable. Dilated dorsal pancreatic duct in the pancreatic head. 7. Nonspecific hypodense lesion in the spleen has enlarged compared to 12/18/2016. 8. Sigmoid colon diverticulosis. 9. 7 mm of degenerative anterolisthesis at L4-5 resulting in bilateral foraminal impingement at this level. Electronically Signed   By: Gaylyn RongWalter  Liebkemann M.D.   On: 12/29/2018 18:08   Dg Abdomen Acute W/chest  Result Date: 12/29/2018 CLINICAL DATA:  Nausea and vomiting.  Hypertension. EXAM: DG ABDOMEN ACUTE W/ 1V CHEST COMPARISON:  Chest radiograph Dec 28, 2017; abdomen radiograph October 22, 2017 FINDINGS: PA chest: No edema or consolidation. There is mild scarring the left apex. Heart size and pulmonary vascular normal. No adenopathy. There is aortic atherosclerosis. Patient is status post median sternotomy. Supine and upright abdomen: There is no bowel dilatation. There are scattered air-fluid levels. No free air. There are vascular calcifications in each common and external iliac artery. Postoperative changes noted in the right abdomen. IMPRESSION: Multiple air-fluid levels which may indicate enteritis or ileus. A degree of bowel obstruction is a consideration given this appearance. No bowel dilatation is noted, however with most loops of bowel fluid-filled. No free air. No edema or consolidation. Status post median sternotomy. Aortic Atherosclerosis (ICD10-I70.0). Electronically Signed  By: Bretta Bang III M.D.   On: 12/29/2018 16:49    Scheduled Meds:  aspirin EC  81 mg Oral QHS   enoxaparin (LOVENOX) injection  40 mg Subcutaneous Q24H   hydrALAZINE  5 mg Intravenous TID   insulin aspart  0-9 Units Subcutaneous Q4H   Continuous Infusions:  sodium chloride 100 mL/hr at 12/30/18 0750     LOS: 1 day     Assessment & Plan:   Principal Problem:   SBO (small bowel obstruction) (HCC) Active Problems:   HTN (hypertension)   DM (diabetes mellitus) (HCC)   CAD (coronary artery disease)   AKI (acute kidney injury) (HCC)  Small bowel obstruction: Continues to be n.p.o. with IV fluids.  Supportive care.  General surgery to see.  Since CT abdomen also suspect possible cholecystitis so I will proceed with dedicated ultrasound abdomen.  Chronic kidney disease stage III: At baseline.  Continue to watch.  Hypertension: Blood pressure reasonably controlled.  Due to being n.p.o., her oral antihypertensives are on hold.  Continue PRN IV hydralazine.  Type 2 diabetes mellitus: Controlled.  Continue SSI.  CAD status post CABG: Continue aspirin.  Agitation: PRN Haldol.  DVT prophylaxis: Lovenox and SCD Code Status: Full code Family Communication: Called and discussed with patient's son Mr. Glynda Jaeger at [phone] Disposition Plan: To be determined pending clinical improvement   Time spent: 30 minutes   Hughie Closs, MD [facility] Pager [phone]  If 7PM-7AM, please contact night-coverage [email or web address] Password [facility] 12/30/2018, 12:56 PM

## 2018-12-30 NOTE — Progress Notes (Signed)
Patient was very agitated and tried to pulled IV and NG tube out. NG tube was almost out. RN re-inserted it. Notified MD. Haldol 1mg  PRN q6hrs was ordered. Patient is on mittens to protect her NG and IV.

## 2018-12-31 ENCOUNTER — Inpatient Hospital Stay (HOSPITAL_COMMUNITY): Payer: Medicare Other

## 2018-12-31 LAB — CBC WITH DIFFERENTIAL/PLATELET
Abs Immature Granulocytes: 0.04 10*3/uL (ref 0.00–0.07)
Basophils Absolute: 0 10*3/uL (ref 0.0–0.1)
Basophils Relative: 0 %
Eosinophils Absolute: 0 10*3/uL (ref 0.0–0.5)
Eosinophils Relative: 0 %
HCT: 35.2 % — ABNORMAL LOW (ref 36.0–46.0)
Hemoglobin: 10.6 g/dL — ABNORMAL LOW (ref 12.0–15.0)
Immature Granulocytes: 0 %
Lymphocytes Relative: 8 %
Lymphs Abs: 0.8 10*3/uL (ref 0.7–4.0)
MCH: 26.9 pg (ref 26.0–34.0)
MCHC: 30.1 g/dL (ref 30.0–36.0)
MCV: 89.3 fL (ref 80.0–100.0)
Monocytes Absolute: 1.3 10*3/uL — ABNORMAL HIGH (ref 0.1–1.0)
Monocytes Relative: 14 %
Neutro Abs: 7.5 10*3/uL (ref 1.7–7.7)
Neutrophils Relative %: 78 %
Platelets: 167 10*3/uL (ref 150–400)
RBC: 3.94 MIL/uL (ref 3.87–5.11)
RDW: 15.6 % — ABNORMAL HIGH (ref 11.5–15.5)
WBC: 9.7 10*3/uL (ref 4.0–10.5)
nRBC: 0 % (ref 0.0–0.2)

## 2018-12-31 LAB — COMPREHENSIVE METABOLIC PANEL
ALT: 14 U/L (ref 0–44)
AST: 23 U/L (ref 15–41)
Albumin: 3.5 g/dL (ref 3.5–5.0)
Alkaline Phosphatase: 81 U/L (ref 38–126)
Anion gap: 13 (ref 5–15)
BUN: 32 mg/dL — ABNORMAL HIGH (ref 8–23)
CO2: 24 mmol/L (ref 22–32)
Calcium: 8.6 mg/dL — ABNORMAL LOW (ref 8.9–10.3)
Chloride: 109 mmol/L (ref 98–111)
Creatinine, Ser: 1.14 mg/dL — ABNORMAL HIGH (ref 0.44–1.00)
GFR calc Af Amer: 49 mL/min — ABNORMAL LOW (ref >60)
GFR calc non Af Amer: 42 mL/min — ABNORMAL LOW (ref >60)
Glucose, Bld: 140 mg/dL — ABNORMAL HIGH (ref 70–99)
Potassium: 3.4 mmol/L — ABNORMAL LOW (ref 3.5–5.1)
Sodium: 146 mmol/L — ABNORMAL HIGH (ref 135–145)
Total Bilirubin: 0.6 mg/dL (ref 0.3–1.2)
Total Protein: 6.6 g/dL (ref 6.5–8.1)

## 2018-12-31 LAB — GLUCOSE, CAPILLARY
Glucose-Capillary: 107 mg/dL — ABNORMAL HIGH (ref 70–99)
Glucose-Capillary: 119 mg/dL — ABNORMAL HIGH (ref 70–99)
Glucose-Capillary: 133 mg/dL — ABNORMAL HIGH (ref 70–99)
Glucose-Capillary: 139 mg/dL — ABNORMAL HIGH (ref 70–99)
Glucose-Capillary: 123 mg/dL — ABNORMAL HIGH (ref 70–99)

## 2018-12-31 LAB — LIPASE, BLOOD: Lipase: 18 U/L (ref 11–51)

## 2018-12-31 LAB — MAGNESIUM: Magnesium: 1.9 mg/dL (ref 1.7–2.4)

## 2018-12-31 MED ORDER — DIATRIZOATE MEGLUMINE & SODIUM 66-10 % PO SOLN
90.0000 mL | Freq: Once | ORAL | Status: AC
  Administered 2018-12-31: 90 mL via NASOGASTRIC
  Filled 2018-12-31: qty 90

## 2018-12-31 MED ORDER — LIP MEDEX EX OINT
TOPICAL_OINTMENT | CUTANEOUS | Status: AC
  Administered 2018-12-31: 20:00:00
  Filled 2018-12-31: qty 7

## 2018-12-31 MED ORDER — PIPERACILLIN-TAZOBACTAM 3.375 G IVPB 30 MIN
3.3750 g | Freq: Once | INTRAVENOUS | Status: AC
  Administered 2018-12-31: 11:00:00 3.375 g via INTRAVENOUS
  Filled 2018-12-31: qty 50

## 2018-12-31 MED ORDER — ENOXAPARIN SODIUM 30 MG/0.3ML ~~LOC~~ SOLN
30.0000 mg | SUBCUTANEOUS | Status: DC
  Administered 2018-12-31: 21:00:00 30 mg via SUBCUTANEOUS
  Filled 2018-12-31: qty 0.3

## 2018-12-31 MED ORDER — SODIUM CHLORIDE 0.9 % IV SOLN
INTRAVENOUS | Status: DC
  Administered 2018-12-31 – 2019-01-05 (×7): via INTRAVENOUS

## 2018-12-31 MED ORDER — PIPERACILLIN-TAZOBACTAM 3.375 G IVPB
3.3750 g | Freq: Three times a day (TID) | INTRAVENOUS | Status: DC
  Administered 2018-12-31 – 2019-01-04 (×13): 3.375 g via INTRAVENOUS
  Filled 2018-12-31 (×14): qty 50

## 2018-12-31 NOTE — Progress Notes (Addendum)
PROGRESS NOTE    [place] M Korff  Sonya Chavez DOB: Sonya Chavez, Sonya Chavez DOA: 12/29/2018 PCP: Alysia Penna, MD   Brief Narrative:  Sonya Chavez is a 85 year old pleasant female with a history of hypertension hyperlipidemia, type 2 diabetes mellitus, CAD status post CABG and history of hysterectomy and prior small bowel obstruction in March 2020 presented to ED on 12/29/2018 with a complaint of nausea, vomiting which started early a.m.  She did not have any abdominal pain, diarrhea, chills, fever, hematuria, dysuria or any other complaint.  Further work-up in the emergency department included CT of the abdomen and pelvis which revealed small bowel obstruction in left lower quadrant and possible additions.  Thick-walled gallbladder with large gallstone and possible acute cholecystitis.  She was subsequently admitted to hospital service and general surgery was consulted.  Ultrasound abdomen was done which again shows possible suspicion of acute cholecystitis.  Consultants:   General surgery  Procedures:   None  Antimicrobials:   Zosyn starting 12/31/2018   Subjective: Patient seen and examined.  According to nurses, patient has been having significant confusion and agitation and has required some sedatives and mittens.  Currently patient is very lethargic and unable to hold any conversation.  She has mittens and she has NG tube which is draining dark brown fluid.  Objective: Vitals:   12/30/18 0801 12/30/18 1338 12/30/18 2130 12/31/18 0403  BP: (!) 157/61 (!) 165/65 (!) 167/66 (!) 167/60  Pulse: 80 72 78 68  Resp: Temp: 98 F (36.7 C) 97.6 F (36.4 C) 98 F (36.7 C) 98.5 F (36.9 C)  TempSrc: Oral Oral Oral Axillary  SpO2: 93% 97% 92% 97%  Weight: 57.9 kg   59.5 kg  Height: 5' 2.01" (1.575 m)       Intake/Output Summary (Last 24 hours) at 12/31/2018 0917 Last data filed at 12/31/2018 0545 Gross per 24 hour  Intake 1579.65 ml  Output 750 ml  Net 829.65  ml   Filed Weights   12/30/18 0401 12/30/18 0801 12/31/18 0403  Weight: 57.9 kg 57.9 kg 59.5 kg    Examination:  General exam: Lethargic but comfortable Respiratory system: Clear to auscultation. Respiratory effort normal. Cardiovascular system: S1 & S2 heard, RRR. No JVD, murmurs, rubs, gallops or clicks. No pedal edema. Gastrointestinal system: Abdomen is nondistended, soft and tender at right upper quadrant. No organomegaly or masses felt. Normal bowel sounds heard. Central nervous system: Lethargic and oriented x0. No focal neurological deficits. Extremities: Symmetric 5 x 5 power. Skin: No rashes, lesions or ulcers Psychiatry: Lethargic   Data Reviewed: I have personally reviewed following labs and imaging studies  CBC: Recent Labs  Lab 12/29/18 1519 12/30/18 0557  WBC 11.5* Chavez.4*  NEUTROABS 9.6*  --   HGB 10.8* 9.9*  HCT 35.3* 33.6*  MCV 84.9 87.7  PLT 186 164   Basic Metabolic Panel: Recent Labs  Lab 12/29/18 1519 12/30/18 0557  NA 144 143  K 4.0 3.7  CL 104 108  CO2 28 26  GLUCOSE 195* 145*  BUN 33* 34*  CREATININE 1.34* 1.31*  CALCIUM 9.6 8.8*   GFR: Estimated Creatinine Clearance: 22.1 mL/min (A) (by C-G formula based on SCr of 1.31 mg/dL (H)). Liver Function Tests: Recent Labs  Lab 12/29/18 1519 12/30/18 0557  AST 17 13*  ALT 12 10  ALKPHOS 98 74  BILITOT 0.8 0.7  PROT 7.4 6.4*  ALBUMIN 3.9 3.4*   Recent Labs  Lab 12/29/18 1519  LIPASE 26   No  results for input(s): AMMONIA in the last 168 hours. Coagulation Profile: No results for input(s): INR, PROTIME in the last 168 hours. Cardiac Enzymes: No results for input(s): CKTOTAL, CKMB, CKMBINDEX, TROPONINI in the last 168 hours. BNP (last 3 results) No results for input(s): PROBNP in the last 8760 hours. HbA1C: Recent Labs    12/30/18 0557  HGBA1C 7.5*   CBG: Recent Labs  Lab 12/30/18 1637 12/30/18 2134 12/30/18 2353 12/31/18 0407 12/31/18 0725  GLUCAP 144* 166* 153* 107*  119*   Lipid Profile: No results for input(s): CHOL, HDL, LDLCALC, TRIG, CHOLHDL, LDLDIRECT in the last 72 hours. Thyroid Function Tests: No results for input(s): TSH, T4TOTAL, FREET4, T3FREE, THYROIDAB in the last 72 hours. Anemia Panel: No results for input(s): VITAMINB12, FOLATE, FERRITIN, TIBC, IRON, RETICCTPCT in the last 72 hours. Sepsis Labs: No results for input(s): PROCALCITON, LATICACIDVEN in the last 168 hours.  Recent Results (from the past 240 hour(s))  SARS Coronavirus 2 (CEPHEID - Performed in [facility] hospital lab), Hosp Order     Status: None   Collection Time: 12/29/18  6:Chavez PM  Result Value Ref Range Status   SARS Coronavirus 2 NEGATIVE NEGATIVE Final    Comment: (NOTE) If result is NEGATIVE SARS-CoV-2 target nucleic acids are NOT DETECTED. The SARS-CoV-2 RNA is generally detectable in upper and lower  respiratory specimens during the acute phase of infection. The lowest  concentration of SARS-CoV-2 viral copies this assay can detect is 250  copies / mL. A negative result does not preclude SARS-CoV-2 infection  and should not be used as the sole basis for treatment or other  patient management decisions.  A negative result Sonya occur with  improper specimen collection / handling, submission of specimen other  than nasopharyngeal swab, presence of viral mutation(s) within the  areas targeted by this assay, and inadequate number of viral copies  (<250 copies / mL). A negative result must be combined with clinical  observations, patient history, and epidemiological information. If result is POSITIVE SARS-CoV-2 target nucleic acids are DETECTED. The SARS-CoV-2 RNA is generally detectable in upper and lower  respiratory specimens dur ing the acute phase of infection.  Positive  results are indicative of active infection with SARS-CoV-2.  Clinical  correlation with patient history and other diagnostic information is  necessary to determine patient infection status.   Positive results do  not rule out bacterial infection or co-infection with other viruses. If result is PRESUMPTIVE POSTIVE SARS-CoV-2 nucleic acids Sonya BE PRESENT.   A presumptive positive result was obtained on the submitted specimen  and confirmed on repeat testing.  While 2019 novel coronavirus  (SARS-CoV-2) nucleic acids Sonya be present in the submitted sample  additional confirmatory testing Sonya be necessary for epidemiological  and / or clinical management purposes  to differentiate between  SARS-CoV-2 and other Sarbecovirus currently known to infect humans.  If clinically indicated additional testing with an alternate test  methodology [phone]) is advised. The SARS-CoV-2 RNA is generally  detectable in upper and lower respiratory sp ecimens during the acute  phase of infection. The expected result is Negative. Fact Sheet for Patients:  BoilerBrush.com.cy Fact Sheet for Healthcare Providers: https://pope.com/ This test is not yet approved or cleared by the [place] FDA and has been authorized for detection and/or diagnosis of SARS-CoV-2 by FDA under an Emergency Use Authorization (EUA).  This EUA will remain in effect (meaning this test can be used) for the duration of the COVID-19 declaration under Section 564(b)(1) of  the Act, 21 U.S.C. section 360bbb-3(b)(1), unless the authorization is terminated or revoked sooner. Performed at [facility], [address], [place], [place] [address]       Radiology Studies: Ct Abdomen Pelvis W Contrast  Result Date: 12/29/2018 CLINICAL DATA:  Reduced oral intake with nausea and vomiting since last night at about 4 a.m. EXAM: CT ABDOMEN AND PELVIS WITH CONTRAST TECHNIQUE: Multidetector CT imaging of the abdomen and pelvis was performed using the standard protocol following bolus administration of intravenous contrast. CONTRAST:  75mL OMNIPAQUE IOHEXOL 300 MG/ML  SOLN  COMPARISON:  10/20/2017 and radiographs from 12/29/2018 FINDINGS: Lower chest: Distended and thick-walled esophagus. Possible paraesophageal edema. Atherosclerotic calcification of the thoracic aorta and coronary arteries with mild cardiomegaly. Trace bilateral pleural effusions. Prominent pulmonary venous structures. Hepatobiliary: Notable gallbladder wall thickening with complex gallstone including some nitrogen gas phenomenon, the presumed gallstone measures about 8.0 by 3.7 cm. Stable mild intrahepatic and extrahepatic biliary dilatation. Pancreas: Mostly atrophic pancreas. Dorsal pancreatic duct dilatation in part of the pancreatic head. Spleen: 1.9 cm hypodense splenic lesion is technically nonspecific on image 20/2, and measured about 0.6 cm on 12/18/2016. Adrenals/Urinary Tract: Both adrenal glands appear normal. Small hypodense left renal lesions are statistically likely to be small cysts but technically nonspecific. Stomach/Bowel: Proximal small bowel obstruction in the left lower quadrant along what is likely an angulated loop of bowel with transition point shown about on image 48/5. Proximal to this the loops of small bowel measure up to 5.4 cm in diameter, all distal bowel is normal caliber. There is notable sigmoid colon diverticulosis without active diverticulitis. Postoperative findings in the right small bowel. Distended stomach. Vascular/Lymphatic: Aortoiliac atherosclerotic vascular disease. 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 Reproductive: Uterus absent.  Adnexa unremarkable. Other: Small amount of free fluid along the right paracolic gutter region. Low-level edema the omentum. Musculoskeletal: 7 mm of degenerative anterolisthesis at L4-5 with resulting bilateral foraminal impingement. IMPRESSION: 1. Small bowel obstruction in the left lower quadrant, with distended stomach and distended small bowel extending down to what appears to probably be an  angulated loop of small bowel, query adhesion. There is a trace amount of free fluid especially in the right paracolic gutter. 2. Thick-walled gallbladder with chronic very large gallstone measuring up to 8 cm in diameter. Acute cholecystitis is not excluded. 3. Distended and thick-walled esophagus. Although the distension Sonya be from the distal obstruction, wall thickening suggests esophagitis. 4. Trace bilateral pleural effusions.  Distended pulmonary veins. 5.  Aortic Atherosclerosis (ICD10-I70.0).  Coronary atherosclerosis. 6. Mild chronic intrahepatic and extrahepatic biliary dilatation. Stable. Dilated dorsal pancreatic duct in the pancreatic head. 7. Nonspecific hypodense lesion in the spleen has enlarged compared to 12/18/2016. 8. Sigmoid colon diverticulosis. 9. 7 mm of degenerative anterolisthesis at L4-5 resulting in bilateral foraminal impingement at this level. Electronically Signed   By: Gaylyn Rong M.D.   On: 12/29/2018 18:08   US Abdomen Limited  Result Date: 12/30/2018 CLINICAL DATA:  Cholecystitis. EXAM: ULTRASOUND ABDOMEN LIMITED RIGHT UPPER QUADRANT COMPARISON:  None. FINDINGS: Gallbladder: There is diffuse gallbladder wall thickening measuring up to approximately 9 mm. The sonographic Murphy sign cannot be adequately assessed. The gallbladder is filled with gallstones as evidence by a wall echo shadow complex. Common bile duct: Diameter: 11.3 mm. Liver: The liver was suboptimally evaluated secondary to patient condition. There is some intrahepatic biliary ductal dilatation. Portal vein is patent on color Doppler imaging with normal direction of blood  flow towards the liver. IMPRESSION: 1. Cholelithiasis with gallbladder wall thickening. Findings are suspicious for acute cholecystitis despite the inability to evaluate for a positive sonographic Murphy sign. If further evaluation is required, HIDA scan can be performed. 2. Dilated common bile duct with intrahepatic biliary ductal  dilatation is concerning for an obstructing process such is choledocholithiasis. 3. Suboptimal evaluation of the liver secondary to poor sonographic windows. Electronically Signed   By: Katherine Mantlehristopher  Green M.D.   On: 12/30/2018 15:31   Dg Abdomen Acute W/chest  Result Date: 12/29/2018 CLINICAL DATA:  Nausea and vomiting.  Hypertension. EXAM: DG ABDOMEN ACUTE W/ 1V CHEST COMPARISON:  Chest radiograph Sonya 26, 2019; abdomen radiograph October 22, 2017 FINDINGS: PA chest: No edema or consolidation. There is mild scarring the left apex. Heart size and pulmonary vascular normal. No adenopathy. There is aortic atherosclerosis. Patient is status post median sternotomy. Supine and upright abdomen: There is no bowel dilatation. There are scattered air-fluid levels. No free air. There are vascular calcifications in each common and external iliac artery. Postoperative changes noted in the right abdomen. IMPRESSION: Multiple air-fluid levels which Sonya indicate enteritis or ileus. A degree of bowel obstruction is a consideration given this appearance. No bowel dilatation is noted, however with most loops of bowel fluid-filled. No free air. No edema or consolidation. Status post median sternotomy. Aortic Atherosclerosis (ICD10-I70.0). Electronically Signed   By: Bretta BangWilliam  Woodruff III M.D.   On: 12/29/2018 16:49    Scheduled Meds:  aspirin EC  81 mg Oral QHS   enoxaparin (LOVENOX) injection  40 mg Subcutaneous Q24H   hydrALAZINE  5 mg Intravenous TID   insulin aspart  0-9 Units Subcutaneous Q4H   Continuous Infusions:  sodium chloride 100 mL/hr at 12/31/18 0346   piperacillin-tazobactam (ZOSYN)  IV     piperacillin-tazobactam       LOS: 2 days   Assessment & Plan:   Principal Problem:   SBO (small bowel obstruction) (HCC) Active Problems:   HTN (hypertension)   DM (diabetes mellitus) (HCC)   CAD (coronary artery disease)   AKI (acute kidney injury) (HCC)  Small bowel obstruction: Continue n.p.o.  with NG tube with intermittent suction and IV fluids.  Reportedly surgery was consulted through the emergency department however she has not been seen by surgery.  I consulted surgery once again today.  Possible acute cholecystitis: Ultrasound abdomen shows possibility of acute cholecystitis.  I will start her on IV Zosyn.  Discussed with general surgery and will defer further management to them.  Acute metabolic encephalopathy: Showing signs of delirium, likely secondary to acute infection and small bowel obstruction.  Hopefully she will get better with treating underlying causes.  Awaiting surgery to see her.  PRN Haldol for agitation.  Chronic kidney disease stage III: At baseline.  Continue to watch.  Hypertension: Blood pressure reasonably controlled.  Due to being n.p.o., her oral antihypertensives are on hold.  Continue PRN IV hydralazine.  Type 2 diabetes mellitus: Controlled.  Continue SSI.  CAD status post CABG: Continue aspirin.  Agitation: PRN Haldol.  DVT prophylaxis: Lovenox and SCD Code Status: Full code Family Communication: Tried to call her husband Mr. Raiford NobleRonald Belvedere however his phone was not working so called and discussed with patient's son Mr. Glynda JaegerJoe Hartman at [phone] and updated about her current situation.  He mentioned he will call his father and update him as well. Disposition Plan: To be determined pending clinical improvement   Time spent: 28 minutes   Hughie Clossavi Mirissa Lopresti,  MD [facility] Pager [phone]  If 7PM-7AM, please contact night-coverage [email or web address] Password [facility] 12/31/2018, 9:17 AM

## 2018-12-31 NOTE — Consult Note (Signed)
[facility] Surgery Consult Note  [place] M Meschke 09-10-1925  [id number].    Requesting MD: Hughie Closs  Chief Complaint: Nausea and vomiting  Reason for Consult: Small bowel obstruction/possible cholecystitis/cholelithiasis  HPI: Patient is a 85 year old female with extensive medical history.  She presented via EMS to the ED on 12/29/2018 with complaints of nausea and vomiting that started the night before admission around 4 AM.  She was having some loose stools but no frank diarrhea.  There was no blood in her emesis or stool.  She was not taking anything orally and the family brought her to the ED for evaluation.  Admission work-up showed some mild blood pressure elevation but she has been afebrile since her admission and vital signs are stable.  [place] labs shows a glucose of 195, BUN of 33, creatinine 1.34.  LFTs are normal.  Lipase was 26.  WBC 11.5, hemoglobin 10.8, hematocrit 35.3, platelets 186,000.  Hemoglobin A1c is 7.5.  Urinalysis is normal. Acute abdomen with chest 5/26: Multiple air-fluid levels which may indicate enteritis or an ileus.  A degree of bowel obstruction based on appearance.  No bowel dilatation is noted however most loops are fluid-filled.  No free air.  No edema or consolidation.  She has had and a median sternotomy with aortic atherosclerosis present.   CT of the abdomen 12/2618: Showed a distended and thick-walled esophagus possible paraesophageal edema.  Trace bilateral pleural effusions.  Gallbladder wall thickening gallstones measuring 8 x 3.7 stable mild intrahepatic and extrahepatic biliary dilatation.  Possible adrenal cyst.  Proximal SBO in left lower quadrant along with is likely to be an angulated loop of bowel with a transition point.  Proximal distal loops of small bowel measure up to 5.4 cm in diameter.  The distal small bowel is of normal caliber.  Sigmoid diverticulosis without diverticulitis.  Postop findings in the right small bowel.   Distended stomach. Abdominal ultrasound 5/27: Diffuse gallbladder wall thickening measuring up to 9 mm.  Gallbladder is filled with gallstones.  CBD is 11.3 mm findings were suspicious for acute cholecystitis, CBD dilatation was concerning for an obstructing process such as choledocholithiasis.  Patient has a history of small bowel obstruction with exploratory laparotomy, small bowel resection and appendectomy 12/20/2016 DR. Abigail Miyamoto. She was seen 10/21/2017 by our service with a small bowel obstruction which resolved within 24 hours and upon use of the small bowel protocol.  Currently the patient is n.p.o.  1700 IV, she voided x6.  NG has 750 recorded yesterday.  She remains afebrile and vital signs are stable.  No labs this a.m.  No films this a.m.  She apparently became agitated confused yesterday afternoon.   We are asked to see.   ROS: Review of Systems  Unable to perform ROS: Patient nonverbal    Family History  Problem Relation Age of Onset  . Gallbladder disease Neg Hx     Past Medical History:  Diagnosis Date  . Arthritis    "qwhere"  . Coronary artery disease   . Hypercholesteremia   . Hypertension   . Myocardial infarction (HCC) 2003  . SBO (small bowel obstruction) (HCC) 12/18/2016  . Stenosis of subclavian artery (HCC)   . Type II diabetes mellitus (HCC)     Past Surgical History:  Procedure Laterality Date  . ABDOMINAL HYSTERECTOMY  1970  . BOWEL RESECTION N/A 12/20/2016   Procedure: SMALL BOWEL RESECTION;  Surgeon: Abigail Miyamoto, MD;  Location: [facility] OR;  Service: General;  Laterality: N/A;  .  CARDIAC CATHETERIZATION    . CATARACT EXTRACTION Bilateral 2000's  . CORONARY ARTERY BYPASS GRAFT  2003   in [place], [place]  . LAPAROTOMY N/A 12/20/2016   Procedure: EXPLORATORY LAPAROTOMY;  Surgeon: Abigail Miyamoto, MD;  Location: [facility] OR;  Service: General;  Laterality: N/A;  . LEFT HEART CATHETERIZATION WITH CORONARY/GRAFT ANGIOGRAM N/A 09/27/2014    Procedure: LEFT HEART CATHETERIZATION WITH Isabel Caprice;  Surgeon: Pamella Pert, MD;  Location: [facility] CATH LAB;  Service: Cardiovascular;  Laterality: N/A;  . UNILATERAL UPPER EXTREMEITY ANGIOGRAM N/A 09/27/2014   Procedure: SUBCLAVIAN ARTERIOGRAM ;  Surgeon: Pamella Pert, MD;  Location: [facility] CATH LAB;  Service: Cardiovascular;  Laterality: N/A;  . UNILATERAL UPPER EXTREMEITY ANGIOGRAM N/A 11/09/2014   Procedure: Left subclavian stent ;  Surgeon: Yates Decamp, MD;  Location: [facility] CATH LAB;  Service: Cardiovascular;  Laterality: N/A;  . VISCERAL ANGIOGRAM N/A 10/25/2014   Procedure: Laurence Slate;  Surgeon: Yates Decamp, MD;  Location: [facility] CATH LAB;  Service: Cardiovascular;  Laterality: N/A;    Social History:  reports that she has never smoked. She has never used smokeless tobacco. She reports that she does not drink alcohol or use drugs.  Allergies:  Allergies  Allergen Reactions  . Statins Other (See Comments)    myalgia myalgias myalgias Myalgias   . Crestor [Rosuvastatin] Other (See Comments)    Myalgias  . Lipitor [Atorvastatin] Other (See Comments)    myalgias  . Zocor [Simvastatin] Other (See Comments)    myalgias    Medications Prior to Admission  Medication Sig Dispense Refill  . alendronate (FOSAMAX) 70 MG tablet Take 70 mg by mouth every Monday.     Marland Kitchen amLODipine (NORVASC) 10 MG tablet Take 10 mg by mouth at bedtime.     Marland Kitchen aspirin EC 81 MG tablet Take 81 mg by mouth at bedtime.     . Calcium Citrate-Vitamin D (CALCIUM CITRATE + PO) Take 1 tablet by mouth daily.     . carvedilol (COREG) 6.25 MG tablet Take 12.5 mg by mouth 2 (two) times daily.    . chlorthalidone (HYGROTON) 25 MG tablet Take 25 mg by mouth daily.    Marland Kitchen gabapentin (NEURONTIN) 300 MG capsule Take 300 mg by mouth at bedtime.     . hydrALAZINE (APRESOLINE) 25 MG tablet Take 25 mg by mouth 3 (three) times daily.     Marland Kitchen losartan (COZAAR) 100 MG tablet Take 100 mg by mouth daily.    Marland Kitchen lovastatin  (MEVACOR) 40 MG tablet Take 40 mg by mouth every evening.    . metFORMIN (GLUCOPHAGE) 500 MG tablet Take 500 mg by mouth at bedtime.      Blood pressure (!) 167/60, pulse 68, temperature 98.5 F (36.9 C), temperature source Axillary, resp. rate 18, height 5' 2.01" (1.575 m), weight 59.5 kg, SpO2 97 %. Physical Exam: Physical Exam Constitutional:      Comments: Elderly 85 year old frail female, lying in bed.  She is incontinent of urine.  She is in restraint mittens, she does not respond to any questions.  She moans continuously and tries to brush your hand away with her mittens hands.  No other response currently.  She will not attempt to speak or answer any questions.  HENT:     Head: Normocephalic.  Eyes:     General: No scleral icterus.       Right eye: No discharge.        Left eye: No discharge.     Conjunctiva/sclera: Conjunctivae  normal.     Comments: Pupils are equal  Neck:     Musculoskeletal: Normal range of motion and neck supple.  Cardiovascular:     Rate and Rhythm: Normal rate and regular rhythm.     Heart sounds: Murmur present.  Pulmonary:     Effort: Pulmonary effort is normal. No respiratory distress.     Breath sounds: Normal breath sounds. No wheezing, rhonchi or rales.  Chest:     Chest wall: No tenderness.  Abdominal:     General: Abdomen is flat. There is no distension.     Palpations: Abdomen is soft. There is no mass.     Tenderness: There is no abdominal tenderness. There is no right CVA tenderness, left CVA tenderness, guarding or rebound.     Hernia: No hernia is present.     Comments: Incision well-healed  Musculoskeletal:        General: No swelling, tenderness, deformity or signs of injury.     Right lower leg: No edema.     Left lower leg: No edema.     Comments: No more than a trace edema around her sock line.  Skin:    General: Skin is warm and dry.     Capillary Refill: Capillary refill takes less than 2 seconds.     Coloration: Skin is not  jaundiced or pale.     Findings: No bruising, erythema, lesion or rash.  Neurological:     Mental Status: She is disoriented.     Comments: Patient will answer questions.  She will focus.  She moans continuously she is in restraint mittens and is incontinent of urine.  Psychiatric:     Comments: Patient is nonverbal, moaning, would not answer questions, and could not be assessed.     Results for orders placed or performed during the hospital encounter of 12/29/18 (from the past 48 hour(s))  CBC with Differential/Platelet     Status: Abnormal   Collection Time: 12/29/18  3:19 PM  Result Value Ref Range   WBC 11.5 (H) 4.0 - 10.5 K/uL   RBC 4.16 3.87 - 5.11 MIL/uL   Hemoglobin 10.8 (L) 12.0 - 15.0 g/dL   HCT 16.1 (L) 09.6 - 04.5 %   MCV 84.9 80.0 - 100.0 fL   MCH 26.0 26.0 - 34.0 pg   MCHC 30.6 30.0 - 36.0 g/dL   RDW 40.9 81.1 - 91.4 %   Platelets 186 150 - 400 K/uL   nRBC 0.0 0.0 - 0.2 %   Neutrophils Relative % 84 %   Neutro Abs 9.6 (H) 1.7 - 7.7 K/uL   Lymphocytes Relative 8 %   Lymphs Abs 1.0 0.7 - 4.0 K/uL   Monocytes Relative 8 %   Monocytes Absolute 1.0 0.1 - 1.0 K/uL   Eosinophils Relative 0 %   Eosinophils Absolute 0.0 0.0 - 0.5 K/uL   Basophils Relative 0 %   Basophils Absolute 0.0 0.0 - 0.1 K/uL   Immature Granulocytes 0 %   Abs Immature Granulocytes 0.03 0.00 - 0.07 K/uL    Comment: Performed at [facility], [address], [place], [place] [address]  Comprehensive metabolic panel     Status: Abnormal   Collection Time: 12/29/18  3:19 PM  Result Value Ref Range   Sodium 144 135 - 145 mmol/L   Potassium 4.0 3.5 - 5.1 mmol/L   Chloride 104 98 - 111 mmol/L   CO2 28 22 - 32 mmol/L   Glucose, Bld 195 (  H) 70 - 99 mg/dL   BUN 33 (H) 8 - 23 mg/dL   Creatinine, Ser 1.61 (H) 0.44 - 1.00 mg/dL   Calcium 9.6 8.9 - 09.6 mg/dL   Total Protein 7.4 6.5 - 8.1 g/dL   Albumin 3.9 3.5 - 5.0 g/dL   AST 17 15 - 41 U/L   ALT 12 0 - 44 U/L   Alkaline  Phosphatase 98 38 - 126 U/L   Total Bilirubin 0.8 0.3 - 1.2 mg/dL   GFR calc non Af Amer 35 (L) >60 mL/min   GFR calc Af Amer 40 (L) >60 mL/min   Anion gap 12 5 - 15    Comment: Performed at [facility], [address] [place], [place] [address]  Lipase, blood     Status: None   Collection Time: 12/29/18  3:19 PM  Result Value Ref Range   Lipase 26 11 - 51 U/L    Comment: Performed at [facility], [address], [place], [place] [address]  Urinalysis, Routine w reflex microscopic     Status: Abnormal   Collection Time: 12/29/18  4:57 PM  Result Value Ref Range   Color, Urine YELLOW YELLOW   APPearance CLEAR CLEAR   Specific Gravity, Urine 1.013 1.005 - 1.030   pH 8.0 5.0 - 8.0   Glucose, UA NEGATIVE NEGATIVE mg/dL   Hgb urine dipstick NEGATIVE NEGATIVE   Bilirubin Urine NEGATIVE NEGATIVE   Ketones, ur 5 (A) NEGATIVE mg/dL   Protein, ur 147 (A) NEGATIVE mg/dL   Nitrite NEGATIVE NEGATIVE   Leukocytes,Ua NEGATIVE NEGATIVE   RBC / HPF 0-5 0 - 5 RBC/hpf   WBC, UA 0-5 0 - 5 WBC/hpf   Bacteria, UA NONE SEEN NONE SEEN   Squamous Epithelial / LPF 0-5 0 - 5    Comment: Performed at [facility], [address] [place], [place] [address]  SARS Coronavirus 2 (CEPHEID - Performed in [facility] hospital lab), Hosp Order     Status: None   Collection Time: 12/29/18  6:14 PM  Result Value Ref Range   SARS Coronavirus 2 NEGATIVE NEGATIVE    Comment: (NOTE) If result is NEGATIVE SARS-CoV-2 target nucleic acids are NOT DETECTED. The SARS-CoV-2 RNA is generally detectable in upper and lower  respiratory specimens during the acute phase of infection. The lowest  concentration of SARS-CoV-2 viral copies this assay can detect is 250  copies / mL. A negative result does not preclude SARS-CoV-2 infection  and should not be used as the sole basis for treatment or other  patient management decisions.  A negative result may occur with   improper specimen collection / handling, submission of specimen other  than nasopharyngeal swab, presence of viral mutation(s) within the  areas targeted by this assay, and inadequate number of viral copies  (<250 copies / mL). A negative result must be combined with clinical  observations, patient history, and epidemiological information. If result is POSITIVE SARS-CoV-2 target nucleic acids are DETECTED. The SARS-CoV-2 RNA is generally detectable in upper and lower  respiratory specimens dur ing the acute phase of infection.  Positive  results are indicative of active infection with SARS-CoV-2.  Clinical  correlation with patient history and other diagnostic information is  necessary to determine patient infection status.  Positive results do  not rule out bacterial infection or co-infection with other viruses. If result is PRESUMPTIVE POSTIVE SARS-CoV-2 nucleic acids MAY BE PRESENT.   A presumptive positive result  was obtained on the submitted specimen  and confirmed on repeat testing.  While 2019 novel coronavirus  (SARS-CoV-2) nucleic acids may be present in the submitted sample  additional confirmatory testing may be necessary for epidemiological  and / or clinical management purposes  to differentiate between  SARS-CoV-2 and other Sarbecovirus currently known to infect humans.  If clinically indicated additional testing with an alternate test  methodology [phone]([id number]) is advised. The SARS-CoV-2 RNA is generally  detectable in upper and lower respiratory sp ecimens during the acute  phase of infection. The expected result is Negative. Fact Sheet for Patients:  BoilerBrush.com.cyhttps://www.fda.gov/media/136312/download Fact Sheet for Healthcare Providers: https://pope.com/https://www.fda.gov/media/136313/download This test is not yet approved or cleared by the [place] FDA and has been authorized for detection and/or diagnosis of SARS-CoV-2 by FDA under an Emergency Use Authorization (EUA).  This EUA will  remain in effect (meaning this test can be used) for the duration of the COVID-19 declaration under Section 564(b)(1) of the Act, 21 U.S.C. section 360bbb-3(b)(1), unless the authorization is terminated or revoked sooner. Performed at [facility], [address] [place], [place] [address]   Glucose, capillary     Status: Abnormal   Collection Time: 12/29/18 11:57 PM  Result Value Ref Range   Glucose-Capillary 164 (H) 70 - 99 mg/dL  Glucose, capillary     Status: Abnormal   Collection Time: 12/30/18  3:58 AM  Result Value Ref Range   Glucose-Capillary 142 (H) 70 - 99 mg/dL  Comprehensive metabolic panel     Status: Abnormal   Collection Time: 12/30/18  5:57 AM  Result Value Ref Range   Sodium 143 135 - 145 mmol/L   Potassium 3.7 3.5 - 5.1 mmol/L   Chloride 108 98 - 111 mmol/L   CO2 26 22 - 32 mmol/L   Glucose, Bld 145 (H) 70 - 99 mg/dL   BUN 34 (H) 8 - 23 mg/dL   Creatinine, Ser 4.131.31 (H) 0.44 - 1.00 mg/dL   Calcium 8.8 (L) 8.9 - 10.3 mg/dL   Total Protein 6.4 (L) 6.5 - 8.1 g/dL   Albumin 3.4 (L) 3.5 - 5.0 g/dL   AST 13 (L) 15 - 41 U/L   ALT 10 0 - 44 U/L   Alkaline Phosphatase 74 38 - 126 U/L   Total Bilirubin 0.7 0.3 - 1.2 mg/dL   GFR calc non Af Amer 36 (L) >60 mL/min   GFR calc Af Amer 41 (L) >60 mL/min   Anion gap 9 5 - 15    Comment: Performed at [facility], [address] [place], [place] [address]  CBC     Status: Abnormal   Collection Time: 12/30/18  5:57 AM  Result Value Ref Range   WBC 14.4 (H) 4.0 - 10.5 K/uL   RBC 3.83 (L) 3.87 - 5.11 MIL/uL   Hemoglobin 9.9 (L) 12.0 - 15.0 g/dL   HCT 02.733.6 (L) 25.336.0 - 66.446.0 %   MCV 87.7 80.0 - 100.0 fL   MCH 25.8 (L) 26.0 - 34.0 pg   MCHC 29.5 (L) 30.0 - 36.0 g/dL   RDW 40.315.4 47.411.5 - 25.915.5 %   Platelets 164 150 - 400 K/uL   nRBC 0.0 0.0 - 0.2 %    Comment: Performed at [facility], [address] [place], [place] [address]  Hemoglobin A1c     Status: Abnormal    Collection Time: 12/30/18  5:57 AM  Result Value Ref Range   Hgb A1c  MFr Bld 7.5 (H) 4.8 - 5.6 %    Comment: (NOTE) Pre diabetes:          5.7%-6.4% Diabetes:              >6.4% Glycemic control for   <7.0% adults with diabetes    Mean Plasma Glucose 168.55 mg/dL    Comment: Performed at [facility], [address] [place], [place] [address]  Glucose, capillary     Status: Abnormal   Collection Time: 12/30/18  7:27 AM  Result Value Ref Range   Glucose-Capillary 135 (H) 70 - 99 mg/dL  Glucose, capillary     Status: Abnormal   Collection Time: 12/30/18 11:47 AM  Result Value Ref Range   Glucose-Capillary 155 (H) 70 - 99 mg/dL  Glucose, capillary     Status: Abnormal   Collection Time: 12/30/18  4:37 PM  Result Value Ref Range   Glucose-Capillary 144 (H) 70 - 99 mg/dL  Glucose, capillary     Status: Abnormal   Collection Time: 12/30/18  9:34 PM  Result Value Ref Range   Glucose-Capillary 166 (H) 70 - 99 mg/dL  Glucose, capillary     Status: Abnormal   Collection Time: 12/30/18 11:53 PM  Result Value Ref Range   Glucose-Capillary 153 (H) 70 - 99 mg/dL  Glucose, capillary     Status: Abnormal   Collection Time: 12/31/18  4:07 AM  Result Value Ref Range   Glucose-Capillary 107 (H) 70 - 99 mg/dL  Glucose, capillary     Status: Abnormal   Collection Time: 12/31/18  7:25 AM  Result Value Ref Range   Glucose-Capillary 119 (H) 70 - 99 mg/dL   Comment 1 Notify RN    Comment 2 Document in Chart    Ct Abdomen Pelvis W Contrast  Result Date: 12/29/2018 CLINICAL DATA:  Reduced oral intake with nausea and vomiting since last night at about 4 a.m. EXAM: CT ABDOMEN AND PELVIS WITH CONTRAST TECHNIQUE: Multidetector CT imaging of the abdomen and pelvis was performed using the standard protocol following bolus administration of intravenous contrast. CONTRAST:  85mL OMNIPAQUE IOHEXOL 300 MG/ML  SOLN COMPARISON:  10/20/2017 and radiographs from 12/29/2018 FINDINGS: Lower chest: Distended  and thick-walled esophagus. Possible paraesophageal edema. Atherosclerotic calcification of the thoracic aorta and coronary arteries with mild cardiomegaly. Trace bilateral pleural effusions. Prominent pulmonary venous structures. Hepatobiliary: Notable gallbladder wall thickening with complex gallstone including some nitrogen gas phenomenon, the presumed gallstone measures about 8.0 by 3.7 cm. Stable mild intrahepatic and extrahepatic biliary dilatation. Pancreas: Mostly atrophic pancreas. Dorsal pancreatic duct dilatation in part of the pancreatic head. Spleen: 1.9 cm hypodense splenic lesion is technically nonspecific on image 20/2, and measured about 0.6 cm on 12/18/2016. Adrenals/Urinary Tract: Both adrenal glands appear normal. Small hypodense left renal lesions are statistically likely to be small cysts but technically nonspecific. Stomach/Bowel: Proximal small bowel obstruction in the left lower quadrant along what is likely an angulated loop of bowel with transition point shown about on image 48/5. Proximal to this the loops of small bowel measure up to 5.4 cm in diameter, all distal bowel is normal caliber. There is notable sigmoid colon diverticulosis without active diverticulitis. Postoperative findings in the right small bowel. Distended stomach. Vascular/Lymphatic: Aortoiliac atherosclerotic vascular disease. 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 Reproductive: Uterus absent.  Adnexa unremarkable. Other: Small amount of free fluid along the right paracolic gutter region. Low-level edema the omentum. Musculoskeletal: 7 mm  of degenerative anterolisthesis at L4-5 with resulting bilateral foraminal impingement. IMPRESSION: 1. Small bowel obstruction in the left lower quadrant, with distended stomach and distended small bowel extending down to what appears to probably be an angulated loop of small bowel, query adhesion. There is a trace amount of free fluid  especially in the right paracolic gutter. 2. Thick-walled gallbladder with chronic very large gallstone measuring up to 8 cm in diameter. Acute cholecystitis is not excluded. 3. Distended and thick-walled esophagus. Although the distension may be from the distal obstruction, wall thickening suggests esophagitis. 4. Trace bilateral pleural effusions.  Distended pulmonary veins. 5.  Aortic Atherosclerosis (ICD10-I70.0).  Coronary atherosclerosis. 6. Mild chronic intrahepatic and extrahepatic biliary dilatation. Stable. Dilated dorsal pancreatic duct in the pancreatic head. 7. Nonspecific hypodense lesion in the spleen has enlarged compared to 12/18/2016. 8. Sigmoid colon diverticulosis. 9. 7 mm of degenerative anterolisthesis at L4-5 resulting in bilateral foraminal impingement at this level. Electronically Signed   By: Gaylyn Rong M.D.   On: 12/29/2018 18:08   US Abdomen Limited  Result Date: 12/30/2018 CLINICAL DATA:  Cholecystitis. EXAM: ULTRASOUND ABDOMEN LIMITED RIGHT UPPER QUADRANT COMPARISON:  None. FINDINGS: Gallbladder: There is diffuse gallbladder wall thickening measuring up to approximately 9 mm. The sonographic Murphy sign cannot be adequately assessed. The gallbladder is filled with gallstones as evidence by a wall echo shadow complex. Common bile duct: Diameter: 11.3 mm. Liver: The liver was suboptimally evaluated secondary to patient condition. There is some intrahepatic biliary ductal dilatation. Portal vein is patent on color Doppler imaging with normal direction of blood flow towards the liver. IMPRESSION: 1. Cholelithiasis with gallbladder wall thickening. Findings are suspicious for acute cholecystitis despite the inability to evaluate for a positive sonographic Murphy sign. If further evaluation is required, HIDA scan can be performed. 2. Dilated common bile duct with intrahepatic biliary ductal dilatation is concerning for an obstructing process such is choledocholithiasis. 3.  Suboptimal evaluation of the liver secondary to poor sonographic windows. Electronically Signed   By: Katherine Mantle M.D.   On: 12/30/2018 15:31   Dg Abdomen Acute W/chest  Result Date: 12/29/2018 CLINICAL DATA:  Nausea and vomiting.  Hypertension. EXAM: DG ABDOMEN ACUTE W/ 1V CHEST COMPARISON:  Chest radiograph Dec 28, 2017; abdomen radiograph October 22, 2017 FINDINGS: PA chest: No edema or consolidation. There is mild scarring the left apex. Heart size and pulmonary vascular normal. No adenopathy. There is aortic atherosclerosis. Patient is status post median sternotomy. Supine and upright abdomen: There is no bowel dilatation. There are scattered air-fluid levels. No free air. There are vascular calcifications in each common and external iliac artery. Postoperative changes noted in the right abdomen. IMPRESSION: Multiple air-fluid levels which may indicate enteritis or ileus. A degree of bowel obstruction is a consideration given this appearance. No bowel dilatation is noted, however with most loops of bowel fluid-filled. No free air. No edema or consolidation. Status post median sternotomy. Aortic Atherosclerosis (ICD10-I70.0). Electronically Signed   By: Bretta Bang III M.D.   On: 12/29/2018 16:49   . sodium chloride 100 mL/hr at 12/31/18 0346  . piperacillin-tazobactam (ZOSYN)  IV    . piperacillin-tazobactam      Assessment/Plan Acute metabolic encephalopathy Type 2 diabetes Hypertension Chronic kidney disease stage III CAD COVID negative   SBO Cholelithiasis/possible cholecystitis  FEN: IV fluids/n.p.o. Id: Zosyn ordered but not given so far. DVT: Lovenox Follow-up: To be determined  Plan: Patient cannot answer any questions so all the history above is taken from  the chart.  On exam she appears completely decompressed.  Her abdomen is flat, NG tube was irrigated is functioning well.  Drainage is bilious.  Bowel sounds are decreased.  There is no tenderness on her  abdominal exam at all.  CT on 10/20/2017 also showed large gallstones and a porcelain gallbladder.  Nothing in her labs currently would make me think she has acute cholecystitis.  WBC is normal this a.m.  Her LFTs have been normal and new labs are pending for this a.m.  I have ordered small bowel protocol and we will start the evaluation at that point.  We will follow with you.  Sherrie George [facility] Surgery 12/31/2018, 9:24 AM Pager: [phone]

## 2018-12-31 NOTE — Progress Notes (Signed)
Pt pulled NGT out apx 2230 given Haldol 1 mg @2144  for agitation. On call MD made aware advising Ok to leave NGT out at present time.

## 2019-01-01 LAB — CBC WITH DIFFERENTIAL/PLATELET
Abs Immature Granulocytes: 0.05 10*3/uL (ref 0.00–0.07)
Basophils Absolute: 0 10*3/uL (ref 0.0–0.1)
Basophils Relative: 0 %
Eosinophils Absolute: 0 10*3/uL (ref 0.0–0.5)
Eosinophils Relative: 0 %
HCT: 33.8 % — ABNORMAL LOW (ref 36.0–46.0)
Hemoglobin: 10 g/dL — ABNORMAL LOW (ref 12.0–15.0)
Immature Granulocytes: 1 %
Lymphocytes Relative: 14 %
Lymphs Abs: 1 10*3/uL (ref 0.7–4.0)
MCH: 26.4 pg (ref 26.0–34.0)
MCHC: 29.6 g/dL — ABNORMAL LOW (ref 30.0–36.0)
MCV: 89.2 fL (ref 80.0–100.0)
Monocytes Absolute: 1 10*3/uL (ref 0.1–1.0)
Monocytes Relative: 14 %
Neutro Abs: 5.2 10*3/uL (ref 1.7–7.7)
Neutrophils Relative %: 71 %
Platelets: 156 10*3/uL (ref 150–400)
RBC: 3.79 MIL/uL — ABNORMAL LOW (ref 3.87–5.11)
RDW: 15.8 % — ABNORMAL HIGH (ref 11.5–15.5)
WBC: 7.4 10*3/uL (ref 4.0–10.5)
nRBC: 0 % (ref 0.0–0.2)

## 2019-01-01 LAB — COMPREHENSIVE METABOLIC PANEL
ALT: 15 U/L (ref 0–44)
AST: 27 U/L (ref 15–41)
Albumin: 3.5 g/dL (ref 3.5–5.0)
Alkaline Phosphatase: 75 U/L (ref 38–126)
Anion gap: 13 (ref 5–15)
BUN: 33 mg/dL — ABNORMAL HIGH (ref 8–23)
CO2: 23 mmol/L (ref 22–32)
Calcium: 8.6 mg/dL — ABNORMAL LOW (ref 8.9–10.3)
Chloride: 113 mmol/L — ABNORMAL HIGH (ref 98–111)
Creatinine, Ser: 1.19 mg/dL — ABNORMAL HIGH (ref 0.44–1.00)
GFR calc Af Amer: 46 mL/min — ABNORMAL LOW (ref >60)
GFR calc non Af Amer: 40 mL/min — ABNORMAL LOW (ref >60)
Glucose, Bld: 150 mg/dL — ABNORMAL HIGH (ref 70–99)
Potassium: 2.8 mmol/L — ABNORMAL LOW (ref 3.5–5.1)
Sodium: 149 mmol/L — ABNORMAL HIGH (ref 135–145)
Total Bilirubin: 1.3 mg/dL — ABNORMAL HIGH (ref 0.3–1.2)
Total Protein: 6.6 g/dL (ref 6.5–8.1)

## 2019-01-01 LAB — GLUCOSE, CAPILLARY
Glucose-Capillary: 106 mg/dL — ABNORMAL HIGH (ref 70–99)
Glucose-Capillary: 126 mg/dL — ABNORMAL HIGH (ref 70–99)
Glucose-Capillary: 137 mg/dL — ABNORMAL HIGH (ref 70–99)
Glucose-Capillary: 137 mg/dL — ABNORMAL HIGH (ref 70–99)
Glucose-Capillary: 143 mg/dL — ABNORMAL HIGH (ref 70–99)
Glucose-Capillary: 171 mg/dL — ABNORMAL HIGH (ref 70–99)
Glucose-Capillary: 195 mg/dL — ABNORMAL HIGH (ref 70–99)

## 2019-01-01 MED ORDER — QUETIAPINE FUMARATE 25 MG PO TABS
25.0000 mg | ORAL_TABLET | Freq: Every day | ORAL | Status: DC
  Administered 2019-01-02 – 2019-01-04 (×3): 25 mg via ORAL
  Filled 2019-01-01 (×4): qty 1

## 2019-01-01 MED ORDER — POTASSIUM CHLORIDE CRYS ER 10 MEQ PO TBCR
40.0000 meq | EXTENDED_RELEASE_TABLET | Freq: Three times a day (TID) | ORAL | Status: AC
  Filled 2019-01-01: qty 4

## 2019-01-01 NOTE — Progress Notes (Signed)
PROGRESS NOTE    Sonya Chavez  MWN:[id number] DOB: 09-Aug-1925 DOA: 12/29/2018 PCP: Alysia PennaHolwerda, Scott, MD   Brief Narrative:  Sonya Chavez is a 85 year old pleasant female with a history of hypertension hyperlipidemia, type 2 diabetes mellitus, CAD status post CABG and history of hysterectomy and prior small bowel obstruction in March 2020 presented to ED on 12/29/2018 with a complaint of nausea, vomiting which started early a.m.  She did not have any abdominal pain, diarrhea, chills, fever, hematuria, dysuria or any other complaint.  Further work-up in the emergency department included CT of the abdomen and pelvis which revealed small bowel obstruction in left lower quadrant and possible additions.  Thick-walled gallbladder with large gallstone and possible acute cholecystitis.  She was subsequently admitted to hospital service and general surgery was consulted.  Ultrasound abdomen was done which again shows possible suspicion of acute cholecystitis.  She happens to be on Zosyn.  She has remained elated since past 2 days.  Afebrile with no leukocytosis.  Consultants:   General surgery  Procedures:   None  Antimicrobials:   Zosyn starting 12/31/2018   Subjective: Patient seen and examined.  Once again she was completely confused however looked comfortable.  She did not complain of anything.  On my examination, she was tender in right upper quadrant.  Objective: Vitals:   12/31/18 2051 01/01/19 0405 01/01/19 0500 01/01/19 1254  BP: (!) 177/54 128/77  (!) 173/75  Pulse: (!) 58 66  75  Resp: 18 14  16   Temp: 98.6 F (37 C) 97.7 F (36.5 C)  98.3 F (36.8 C)  TempSrc: Oral Oral  Axillary  SpO2: 97% 100%  98%  Weight:   59.5 kg   Height:        Intake/Output Summary (Last 24 hours) at 01/01/2019 1419 Last data filed at 01/01/2019 0300 Gross per 24 hour  Intake 2131.25 ml  Output 2 ml  Net 2129.25 ml   Filed Weights   12/30/18 0801 12/31/18 0403 01/01/19 0500   Weight: 57.9 kg 59.5 kg 59.5 kg    Examination:  General exam: Appears calm but confused and comfortable  Respiratory system: Clear to auscultation. Respiratory effort normal. Cardiovascular system: S1 & S2 heard, RRR. No JVD, murmurs, rubs, gallops or clicks. No pedal edema. Gastrointestinal system: Abdomen is nondistended, soft and right upper quadrant tenderness,. No organomegaly or masses felt. Normal bowel sounds heard. Central nervous system: Alert and oriented x0. No focal neurological deficits. Extremities: Symmetric 5 x 5 power. Skin: No rashes, lesions or ulcers Psychiatry: Confused   Data Reviewed: I have personally reviewed following labs and imaging studies  CBC: Recent Labs  Lab 12/29/18 1519 12/30/18 0557 12/31/18 1010 01/01/19 0636  WBC 11.5* 14.4* 9.7 7.4  NEUTROABS 9.6*  --  7.5 5.2  HGB 10.8* 9.9* 10.6* 10.0*  HCT 35.3* 33.6* 35.2* 33.8*  MCV 84.9 87.7 89.3 89.2  PLT 186 164 167 156   Basic Metabolic Panel: Recent Labs  Lab 12/29/18 1519 12/30/18 0557 12/31/18 1010 01/01/19 0636  NA 144 143 146* 149*  K 4.0 3.7 3.4* 2.8*  CL 104 108 109 113*  CO2 28 26 24 23   GLUCOSE 195* 145* 140* 150*  BUN 33* 34* 32* 33*  CREATININE 1.34* 1.31* 1.14* 1.19*  CALCIUM 9.6 8.8* 8.6* 8.6*  MG  --   --  1.9  --    GFR: Estimated Creatinine Clearance: 24.4 mL/min (A) (by C-G formula based on SCr of 1.19 mg/dL (H)). Liver Function Tests:  Recent Labs  Lab 12/29/18 1519 12/30/18 0557 12/31/18 1010 01/01/19 0636  AST 17 13* 23 27  ALT ALKPHOS 98 74 81 75  BILITOT 0.8 0.7 0.6 1.3*  PROT 7.4 6.4* 6.6 6.6  ALBUMIN 3.9 3.4* 3.5 3.5   Recent Labs  Lab 12/29/18 1519 12/31/18 1010  LIPASE 26 18   No results for input(s): AMMONIA in the last 168 hours. Coagulation Profile: No results for input(s): INR, PROTIME in the last 168 hours. Cardiac Enzymes: No results for input(s): CKTOTAL, CKMB, CKMBINDEX, TROPONINI in the last 168 hours. BNP (last  3 results) No results for input(s): PROBNP in the last 8760 hours. HbA1C: Recent Labs    12/30/18 0557  HGBA1C 7.5*   CBG: Recent Labs  Lab 12/31/18 1935 01/01/19 0007 01/01/19 0402 01/01/19 0752 01/01/19 1130  GLUCAP 123* 137* 137* 143* 106*   Lipid Profile: No results for input(s): CHOL, HDL, LDLCALC, TRIG, CHOLHDL, LDLDIRECT in the last 72 hours. Thyroid Function Tests: No results for input(s): TSH, T4TOTAL, FREET4, T3FREE, THYROIDAB in the last 72 hours. Anemia Panel: No results for input(s): VITAMINB12, FOLATE, FERRITIN, TIBC, IRON, RETICCTPCT in the last 72 hours. Sepsis Labs: No results for input(s): PROCALCITON, LATICACIDVEN in the last 168 hours.  Recent Results (from the past 240 hour(s))  SARS Coronavirus 2 (CEPHEID - Performed in [facility] hospital lab), Hosp Order     Status: None   Collection Time: 12/29/18  6:14 PM  Result Value Ref Range Status   SARS Coronavirus 2 NEGATIVE NEGATIVE Final    Comment: (NOTE) If result is NEGATIVE SARS-CoV-2 target nucleic acids are NOT DETECTED. The SARS-CoV-2 RNA is generally detectable in upper and lower  respiratory specimens during the acute phase of infection. The lowest  concentration of SARS-CoV-2 viral copies this assay can detect is 250  copies / mL. A negative result does not preclude SARS-CoV-2 infection  and should not be used as the sole basis for treatment or other  patient management decisions.  A negative result may occur with  improper specimen collection / handling, submission of specimen other  than nasopharyngeal swab, presence of viral mutation(s) within the  areas targeted by this assay, and inadequate number of viral copies  (<250 copies / mL). A negative result must be combined with clinical  observations, patient history, and epidemiological information. If result is POSITIVE SARS-CoV-2 target nucleic acids are DETECTED. The SARS-CoV-2 RNA is generally detectable in upper and lower   respiratory specimens dur ing the acute phase of infection.  Positive  results are indicative of active infection with SARS-CoV-2.  Clinical  correlation with patient history and other diagnostic information is  necessary to determine patient infection status.  Positive results do  not rule out bacterial infection or co-infection with other viruses. If result is PRESUMPTIVE POSTIVE SARS-CoV-2 nucleic acids MAY BE PRESENT.   A presumptive positive result was obtained on the submitted specimen  and confirmed on repeat testing.  While 2019 novel coronavirus  (SARS-CoV-2) nucleic acids may be present in the submitted sample  additional confirmatory testing may be necessary for epidemiological  and / or clinical management purposes  to differentiate between  SARS-CoV-2 and other Sarbecovirus currently known to infect humans.  If clinically indicated additional testing with an alternate test  methodology [phone]) is advised. The SARS-CoV-2 RNA is generally  detectable in upper and lower respiratory sp ecimens during the acute  phase of infection. The expected result is Negative. Fact Sheet  for Patients:  BoilerBrush.com.cy Fact Sheet for Healthcare Providers: https://pope.com/ This test is not yet approved or cleared by the [place] FDA and has been authorized for detection and/or diagnosis of SARS-CoV-2 by FDA under an Emergency Use Authorization (EUA).  This EUA will remain in effect (meaning this test can be used) for the duration of the COVID-19 declaration under Section 564(b)(1) of the Act, 21 U.S.C. section 360bbb-3(b)(1), unless the authorization is terminated or revoked sooner. Performed at [facility], [address], [place], [place] [address]       Radiology Studies: US Abdomen Limited  Result Date: 12/30/2018 CLINICAL DATA:  Cholecystitis. EXAM: ULTRASOUND ABDOMEN LIMITED RIGHT UPPER QUADRANT  COMPARISON:  None. FINDINGS: Gallbladder: There is diffuse gallbladder wall thickening measuring up to approximately 9 mm. The sonographic Murphy sign cannot be adequately assessed. The gallbladder is filled with gallstones as evidence by a wall echo shadow complex. Common bile duct: Diameter: 11.3 mm. Liver: The liver was suboptimally evaluated secondary to patient condition. There is some intrahepatic biliary ductal dilatation. Portal vein is patent on color Doppler imaging with normal direction of blood flow towards the liver. IMPRESSION: 1. Cholelithiasis with gallbladder wall thickening. Findings are suspicious for acute cholecystitis despite the inability to evaluate for a positive sonographic Murphy sign. If further evaluation is required, HIDA scan can be performed. 2. Dilated common bile duct with intrahepatic biliary ductal dilatation is concerning for an obstructing process such is choledocholithiasis. 3. Suboptimal evaluation of the liver secondary to poor sonographic windows. Electronically Signed   By: Katherine Mantle M.D.   On: 12/30/2018 15:31   Dg Abd Portable 1v-small Bowel Obstruction Protocol-initial, 8 Hr Delay  Result Date: 12/31/2018 CLINICAL DATA:  Possible small bowel obstruction. 8 hour film after oral contrast administration. EXAM: PORTABLE ABDOMEN - 1 VIEW COMPARISON:  Single-view of the abdomen 12/31/2018 at 1347 hours FINDINGS: Contrast material is now seen throughout a decompressed colon. No small bowel dilatation is identified. NG tube remains in place. IMPRESSION: Negative for small bowel obstruction.  No acute finding. Electronically Signed   By: Drusilla Kanner M.D.   On: 12/31/2018 19:51   Dg Abd Portable 1v-small Bowel Protocol-position Verification  Result Date: 12/31/2018 CLINICAL DATA:  NG tube placement EXAM: PORTABLE ABDOMEN - 1 VIEW COMPARISON:  CT dated 12/29/2018. FINDINGS: The NG tube tip projects over the gastric antrum/pylorus. The tip is pointed distally.  The bowel gas pattern is nonspecific and nonobstructive. Oral contrast is seen in several small bowel loops as well as the majority of the colon. IMPRESSION: NG tube tip projects over the gastric antrum/pylorus. Electronically Signed   By: Katherine Mantle M.D.   On: 12/31/2018 14:10    Scheduled Meds: . aspirin EC  81 mg Oral QHS  . enoxaparin (LOVENOX) injection  30 mg Subcutaneous Q24H  . hydrALAZINE  5 mg Intravenous TID  . insulin aspart  0-9 Units Subcutaneous Q4H  . potassium chloride  40 mEq Oral TID  . QUEtiapine  25 mg Oral QHS   Continuous Infusions: . sodium chloride 100 mL/hr at 01/01/19 0300  . piperacillin-tazobactam (ZOSYN)  IV 3.375 g (01/01/19 0556)     LOS: 3 days   Assessment & Plan:   Principal Problem:   SBO (small bowel obstruction) (HCC) Active Problems:   HTN (hypertension)   DM (diabetes mellitus) (HCC)   CAD (coronary artery disease)   AKI (acute kidney injury) (HCC)  Small bowel obstruction: Underwent small bowel follow-through which was negative so  she was ruled out of small bowel obstruction.  Surgery has seen her and cleared her to advance her diet.  Will start her on full liquid diet.  Possible acute cholecystitis with cholelithiasis: Ultrasound abdomen shows possibility of acute cholecystitis.  Surgery does not seem to think she has cholecystitis.  Understandably, she is afebrile with no leukocytosis but she is tender to palpation on my examination.  I planned to proceed with HIDA scan however I was informed that nuclear medicine is not available today and on the weekend so we will hold off to that but in the meantime I will continue Zosyn for now.  Acute metabolic encephalopathy/delirium/agitation: Showing signs of delirium, likely secondary to acute infection versus hospitalization.  Discussed in length with the nurses to keep her room lit during the daytime and will try Seroquel 25 mg nightly so she can sleep better.  Will not order a.m. labs so  she can rest.  Continue PRN Haldol for agitation.  Chronic kidney disease stage III: At baseline.  Continue to watch.  Hypertension: Blood pressure reasonably controlled.  Due to being n.p.o., her oral antihypertensives are on hold.  Continue PRN IV hydralazine.  Type 2 diabetes mellitus: Controlled.  Continue SSI.  CAD status post CABG: Continue aspirin.  DVT prophylaxis: Lovenox and SCD Code Status: Full code Family Communication: Called patient's husband Mr. Sonya Chavez and updated him and answered all his questions. Disposition Plan: To be determined pending clinical improvement   Time spent: 35 minutes.   Hughie Closs, MD [facility] Pager [phone]  If 7PM-7AM, please contact night-coverage [email or web address] Password [email or web address] 01/01/2019, 2:19 PM

## 2019-01-01 NOTE — Progress Notes (Addendum)
    CC: Nausea and vomiting  Subjective: Patient is awake in bed this morning.  She is up on her elbows.  She did speak some to me this morning, but currently she will not answer any questions.  The nursing staff says she was talking earlier and identified her family.  Abdomen is soft and nontender.  She has had a bowel movement since receiving the small bowel protocol.  Objective: Vital signs in last 24 hours: Temp:  [97.7 F (36.5 C)-98.6 F (37 C)] 97.7 F (36.5 C) (05/29 0405) Pulse Rate:  [58-80] 66 (05/29 0405) Resp:  [14-18] 14 (05/29 0405) BP: (128-178)/(54-77) 128/77 (05/29 0405) SpO2:  [97 %-100 %] 100 % (05/29 0405) Weight:  [59.5 kg] 59.5 kg (05/29 0500) Last BM Date: 01/01/19 2131 IV Voided x1 400 through the NG which has been pulled by the patient BM x1 Afebrile vital signs are stable Potassium is 2.8 Creatinine 1.19 Small bowel protocol: Contrast material seen throughout the decompressed colon no small bowel dilatation is identified   Intake/Output from previous day: 05/28 0701 - 05/29 0700 In: 2131.3 [I.V.:2013.3; IV Piggyback:118] Out: 402 [Urine:1; Emesis/NG output:400; Stool:1] Intake/Output this shift: No intake/output data recorded.  General appearance: alert, no distress and She is much more with it today she is sitting up looking around and speaking some.  She still not answering questions, at least not currently with me in the room. GI: soft, non-tender; bowel sounds normal; no masses,  no organomegaly  Lab Results:  Recent Labs    12/31/18 1010 01/01/19 0636  WBC 9.7 7.4  HGB 10.6* 10.0*  HCT 35.2* 33.8*  PLT 167 156    BMET Recent Labs    12/31/18 1010 01/01/19 0636  NA 146* 149*  K 3.4* 2.8*  CL 109 113*  CO2 24 23  GLUCOSE 140* 150*  BUN 32* 33*  CREATININE 1.14* 1.19*  CALCIUM 8.6* 8.6*   PT/INR No results for input(s): LABPROT, INR in the last 72 hours.  Recent Labs  Lab 12/29/18 1519 12/30/18 0557 12/31/18 1010  01/01/19 0636  AST 17 13* 23 27  ALT 12 10 14 15   ALKPHOS 98 74 81 75  BILITOT 0.8 0.7 0.6 1.3*  PROT 7.4 6.4* 6.6 6.6  ALBUMIN 3.9 3.4* 3.5 3.5     Lipase     Component Value Date/Time   LIPASE 18 12/31/2018 1010     Medications: . aspirin EC  81 mg Oral QHS  . enoxaparin (LOVENOX) injection  30 mg Subcutaneous Q24H  . hydrALAZINE  5 mg Intravenous TID  . insulin aspart  0-9 Units Subcutaneous Q4H    Assessment/Plan Acute metabolic encephalopathy Type 2 diabetes Hypertension Chronic kidney disease stage III CAD COVID negative Hypokalemia potassium 2.8  SBO  - SB protocol show contrast throughout the colon/No SBO currently  - advance diet as tolerated Cholelithiasis/possible cholecystitis  - normal WBC/normal LFT's  Non tender on my exam yesterday and today  FEN: IV fluids/n.p.o. Id: Zosyn ordered but not given so far. DVT: Lovenox Follow-up: To be determined   Plan: She does not have a small bowel obstruction.  From our standpoint she can proceed with advancing her diet.  Her LFTs are normal, her lipase is normal and her WBC is normal.  Nothing to suggest cholecystitis.  No surgical indication for cholecystectomy at this time. Dr. Jacqulyn Bath has ordered a HIDA scan, we will await results.    LOS: 3 days    Dajae Kizer 01/01/2019 [phone]

## 2019-01-01 NOTE — Progress Notes (Signed)
Pt ambulated to end of hallway with this RN and minimal assist. Sat up in chair and tolerated full liquid diet. Will continue to monitor

## 2019-01-01 NOTE — Care Management Important Message (Signed)
Important Message  Patient Details IM Letter given to Marcelle Smiling RN to present to the Patient Name: Sonya Chavez MRN: [id number] Date of Birth: [birth year]   Medicare Important Message Given:  Yes    Caren Macadam 01/01/2019, 10:41 AMImportant Message  Patient Details  Name: Sonya Chavez MRN: [id number] Date of Birth: 01-24-1926   Medicare Important Message Given:  Yes    Caren Macadam 01/01/2019, 10:41 AM

## 2019-01-02 DIAGNOSIS — K8020 Calculus of gallbladder without cholecystitis without obstruction: Secondary | ICD-10-CM

## 2019-01-02 LAB — CBC WITH DIFFERENTIAL/PLATELET
Abs Immature Granulocytes: 0.13 10*3/uL — ABNORMAL HIGH (ref 0.00–0.07)
Basophils Absolute: 0 10*3/uL (ref 0.0–0.1)
Basophils Relative: 0 %
Eosinophils Absolute: 0 10*3/uL (ref 0.0–0.5)
Eosinophils Relative: 0 %
HCT: 36.1 % (ref 36.0–46.0)
Hemoglobin: 10.9 g/dL — ABNORMAL LOW (ref 12.0–15.0)
Immature Granulocytes: 1 %
Lymphocytes Relative: 9 %
Lymphs Abs: 1 10*3/uL (ref 0.7–4.0)
MCH: 26.3 pg (ref 26.0–34.0)
MCHC: 30.2 g/dL (ref 30.0–36.0)
MCV: 87.2 fL (ref 80.0–100.0)
Monocytes Absolute: 0.8 10*3/uL (ref 0.1–1.0)
Monocytes Relative: 8 %
Neutro Abs: 8.9 10*3/uL — ABNORMAL HIGH (ref 1.7–7.7)
Neutrophils Relative %: 82 %
Platelets: 185 10*3/uL (ref 150–400)
RBC: 4.14 MIL/uL (ref 3.87–5.11)
RDW: 15.4 % (ref 11.5–15.5)
WBC: 10.8 10*3/uL — ABNORMAL HIGH (ref 4.0–10.5)
nRBC: 0 % (ref 0.0–0.2)

## 2019-01-02 LAB — GLUCOSE, CAPILLARY
Glucose-Capillary: 120 mg/dL — ABNORMAL HIGH (ref 70–99)
Glucose-Capillary: 158 mg/dL — ABNORMAL HIGH (ref 70–99)
Glucose-Capillary: 190 mg/dL — ABNORMAL HIGH (ref 70–99)
Glucose-Capillary: 157 mg/dL — ABNORMAL HIGH (ref 70–99)
Glucose-Capillary: 210 mg/dL — ABNORMAL HIGH (ref 70–99)

## 2019-01-02 LAB — COMPREHENSIVE METABOLIC PANEL
ALT: 17 U/L (ref 0–44)
AST: 27 U/L (ref 15–41)
Albumin: 3.1 g/dL — ABNORMAL LOW (ref 3.5–5.0)
Alkaline Phosphatase: 75 U/L (ref 38–126)
Anion gap: 10 (ref 5–15)
BUN: 22 mg/dL (ref 8–23)
CO2: 24 mmol/L (ref 22–32)
Calcium: 8.5 mg/dL — ABNORMAL LOW (ref 8.9–10.3)
Chloride: 112 mmol/L — ABNORMAL HIGH (ref 98–111)
Creatinine, Ser: 1.17 mg/dL — ABNORMAL HIGH (ref 0.44–1.00)
GFR calc Af Amer: 47 mL/min — ABNORMAL LOW (ref >60)
GFR calc non Af Amer: 41 mL/min — ABNORMAL LOW (ref >60)
Glucose, Bld: 267 mg/dL — ABNORMAL HIGH (ref 70–99)
Potassium: 2.5 mmol/L — CL (ref 3.5–5.1)
Sodium: 146 mmol/L — ABNORMAL HIGH (ref 135–145)
Total Bilirubin: 0.8 mg/dL (ref 0.3–1.2)
Total Protein: 6.3 g/dL — ABNORMAL LOW (ref 6.5–8.1)

## 2019-01-02 LAB — PROCALCITONIN: Procalcitonin: 0.13 ng/mL

## 2019-01-02 LAB — MAGNESIUM: Magnesium: 1.7 mg/dL (ref 1.7–2.4)

## 2019-01-02 MED ORDER — POTASSIUM CHLORIDE 10 MEQ/100ML IV SOLN
10.0000 meq | INTRAVENOUS | Status: AC
  Administered 2019-01-02 (×6): 10 meq via INTRAVENOUS
  Filled 2019-01-02 (×6): qty 100

## 2019-01-02 MED ORDER — CHLORTHALIDONE 25 MG PO TABS
25.0000 mg | ORAL_TABLET | Freq: Every day | ORAL | Status: DC
  Administered 2019-01-03 – 2019-01-05 (×2): 25 mg via ORAL
  Filled 2019-01-02 (×4): qty 1

## 2019-01-02 MED ORDER — AMLODIPINE BESYLATE 10 MG PO TABS
10.0000 mg | ORAL_TABLET | Freq: Every day | ORAL | Status: DC
  Administered 2019-01-02 – 2019-01-04 (×3): 10 mg via ORAL
  Filled 2019-01-02 (×3): qty 1

## 2019-01-02 MED ORDER — LOSARTAN POTASSIUM 50 MG PO TABS
100.0000 mg | ORAL_TABLET | Freq: Every day | ORAL | Status: DC
  Administered 2019-01-03 – 2019-01-05 (×2): 100 mg via ORAL
  Filled 2019-01-02 (×4): qty 2

## 2019-01-02 MED ORDER — POTASSIUM CHLORIDE 10 MEQ/100ML IV SOLN: 10.0000 meq | INTRAVENOUS | Status: DC

## 2019-01-02 MED ORDER — HYDRALAZINE HCL 25 MG PO TABS
25.0000 mg | ORAL_TABLET | Freq: Three times a day (TID) | ORAL | Status: DC
  Administered 2019-01-02 – 2019-01-05 (×7): 25 mg via ORAL
  Filled 2019-01-02 (×8): qty 1

## 2019-01-02 MED ORDER — HYDRALAZINE HCL 20 MG/ML IJ SOLN: 10.0000 mg | Freq: Four times a day (QID) | INTRAMUSCULAR | Status: DC | PRN

## 2019-01-02 MED ORDER — CARVEDILOL 12.5 MG PO TABS
12.5000 mg | ORAL_TABLET | Freq: Two times a day (BID) | ORAL | Status: DC
  Administered 2019-01-02 – 2019-01-05 (×5): 12.5 mg via ORAL
  Filled 2019-01-02 (×7): qty 1

## 2019-01-02 NOTE — Progress Notes (Signed)
[place] M Debarr [id number] [birth year]  CARE TEAM:  PCP: Alysia Penna, MD  Outpatient Care Team: Patient Care Team: Alysia Penna, MD as PCP - General (Internal Medicine)  Inpatient Treatment Team: Treatment Team: Attending Provider: Hughie Closs, MD; Rounding Team: Delmer Islam, MD; Registered Nurse: Johney Frame, RN; Rounding Team: Bishop Limbo, MD; Consulting Physician: Darnell Level, MD; Technician: Karlyn Agee, [place]; Registered Nurse: Virgina Norfolk, RN; Technician: Renaldo Reel, NT   Problem List:   Principal Problem:   SBO (small bowel obstruction) (HCC) Active Problems:   HTN (hypertension)   DM (diabetes mellitus) (HCC)   CAD (coronary artery disease)   Delirium due to another medical condition   AKI (acute kidney injury) (HCC)      * No surgery found *      Assessment  No strong evidence of any intra-abdominal issues  [facility] Stay = 4 days)  Plan:  SBO resolved.  - SB protocol show contrast throughout the colon/No SBO currently  - advance diet as tolerated Cholelithiasis  -Giant calcified gallstone fills up a fair amount of the gallbladder.  Normal WBC/normal LFT's  Non tender on exam for the past 3 days.  Suspect the gallstone is incidental.  I am skeptical that a 85 year old gallbladder is not going to particularLY normal.  Does not seem particularly inflamed to my view.  No Murphy sign.  Medicine is hedging and ordered a nuclear medicine HIDA scan.  We will certainly technically possible to do cholecystectomy, the challenge would be is this 85 year old fragile patient a surgical candidate?  Would need medical and cardiac clearance before thinking it.  More likely would do percutaneous IR drainage of the gallbladder if there truly is cystic duct obstruction. FEN: IV fluids/n.p.o. KG:MWNUU ordered DVT: Lovenox Follow-up: To be determined   15 minutes spent in review, evaluation, examination, counseling, and coordination of  care.  More than 50% of that time was spent in counseling.  01/02/2019    Subjective: (Chief complaint)  Having BMs Agitated & confused last night.  Calm this morning  Objective:  Vital signs:  Vitals:   01/01/19 1429 01/01/19 1824 01/01/19 1934 01/02/19 0449  BP: (!) 185/50 (!) 166/49 (!) 183/53 (!) 194/64  Pulse:   70 64  Resp:   17 18  Temp:   98.4 F (36.9 C) (!) 97.2 F (36.2 C)  TempSrc:   Oral Axillary  SpO2:   93% 93%  Weight:    57.3 kg  Height:        Last BM Date: 01/01/19  Intake/Output   Yesterday:  05/29 0701 - 05/30 0700 In: 240 [P.O.:240] Out: -  This shift:  No intake/output data recorded.  Bowel function:  Flatus: YES  BM:  YES  Drain: (No drain)   Physical Exam:  General: Pt awake awakens in no acute distress.  Not very interactive but answer simple questions and follows some commands.  Not in any acute distress Eyes: PERRL, normal EOM.  Sclera clear.  No icterus Neuro: CN II-XII intact w/o focal sensory/motor deficits. Lymph: No head/neck/groin lymphadenopathy Psych:  No delerium/psychosis/paranoia HENT: Normocephalic, Mucus membranes moist.  No thrush Neck: Supple, No tracheal deviation Chest: No chest wall pain w good excursion CV:  Pulses intact.  Regular rhythm MS: Normal AROM mjr joints.  No obvious deformity  Abdomen: Soft.  Nondistended.  Nontender.  No evidence of peritonitis.  No incarcerated hernias.  Ext: Hand mittens in place.  No deformity.  No mjr edema.  No cyanosis Skin: No petechiae / purpura  Results:   Cultures: Recent Results (from the past 720 hour(s))  SARS Coronavirus 2 (CEPHEID - Performed in [facility] hospital lab), Hosp Order     Status: None   Collection Time: 12/29/18  6:14 PM  Result Value Ref Range Status   SARS Coronavirus 2 NEGATIVE NEGATIVE Final    Comment: (NOTE) If result is NEGATIVE SARS-CoV-2 target nucleic acids are NOT DETECTED. The SARS-CoV-2 RNA is generally detectable in upper  and lower  respiratory specimens during the acute phase of infection. The lowest  concentration of SARS-CoV-2 viral copies this assay can detect is 250  copies / mL. A negative result does not preclude SARS-CoV-2 infection  and should not be used as the sole basis for treatment or other  patient management decisions.  A negative result may occur with  improper specimen collection / handling, submission of specimen other  than nasopharyngeal swab, presence of viral mutation(s) within the  areas targeted by this assay, and inadequate number of viral copies  (<250 copies / mL). A negative result must be combined with clinical  observations, patient history, and epidemiological information. If result is POSITIVE SARS-CoV-2 target nucleic acids are DETECTED. The SARS-CoV-2 RNA is generally detectable in upper and lower  respiratory specimens dur ing the acute phase of infection.  Positive  results are indicative of active infection with SARS-CoV-2.  Clinical  correlation with patient history and other diagnostic information is  necessary to determine patient infection status.  Positive results do  not rule out bacterial infection or co-infection with other viruses. If result is PRESUMPTIVE POSTIVE SARS-CoV-2 nucleic acids MAY BE PRESENT.   A presumptive positive result was obtained on the submitted specimen  and confirmed on repeat testing.  While 2019 novel coronavirus  (SARS-CoV-2) nucleic acids may be present in the submitted sample  additional confirmatory testing may be necessary for epidemiological  and / or clinical management purposes  to differentiate between  SARS-CoV-2 and other Sarbecovirus currently known to infect humans.  If clinically indicated additional testing with an alternate test  methodology [phone]([id number]) is advised. The SARS-CoV-2 RNA is generally  detectable in upper and lower respiratory sp ecimens during the acute  phase of infection. The expected result is  Negative. Fact Sheet for Patients:  BoilerBrush.com.cyhttps://www.fda.gov/media/136312/download Fact Sheet for Healthcare Providers: https://pope.com/https://www.fda.gov/media/136313/download This test is not yet approved or cleared by the [place] FDA and has been authorized for detection and/or diagnosis of SARS-CoV-2 by FDA under an Emergency Use Authorization (EUA).  This EUA will remain in effect (meaning this test can be used) for the duration of the COVID-19 declaration under Section 564(b)(1) of the Act, 21 U.S.C. section 360bbb-3(b)(1), unless the authorization is terminated or revoked sooner. Performed at [facility], [address] [place], [place] [id number]     Labs: Results for orders placed or performed during the hospital encounter of 12/29/18 (from the past 48 hour(s))  CBC with Differential/Platelet     Status: Abnormal   Collection Time: 12/31/18 10:10 AM  Result Value Ref Range   WBC 9.7 4.0 - 10.5 K/uL   RBC 3.94 3.87 - 5.11 MIL/uL   Hemoglobin 10.6 (L) 12.0 - 15.0 g/dL   HCT 81.135.2 (L) 91.436.0 - 78.246.0 %   MCV 89.3 80.0 - 100.0 fL   MCH 26.9 26.0 - 34.0 pg   MCHC 30.1 30.0 - 36.0 g/dL   RDW 95.615.6 (H) 21.311.5 - 08.615.5 %   Platelets  167 150 - 400 K/uL   nRBC 0.0 0.0 - 0.2 %   Neutrophils Relative % 78 %   Neutro Abs 7.5 1.7 - 7.7 K/uL   Lymphocytes Relative 8 %   Lymphs Abs 0.8 0.7 - 4.0 K/uL   Monocytes Relative 14 %   Monocytes Absolute 1.3 (H) 0.1 - 1.0 K/uL   Eosinophils Relative 0 %   Eosinophils Absolute 0.0 0.0 - 0.5 K/uL   Basophils Relative 0 %   Basophils Absolute 0.0 0.0 - 0.1 K/uL   Immature Granulocytes 0 %   Abs Immature Granulocytes 0.04 0.00 - 0.07 K/uL    Comment: Performed at [facility], [address] [place], [place] [address]  Comprehensive metabolic panel     Status: Abnormal   Collection Time: 12/31/18 10:10 AM  Result Value Ref Range   Sodium 146 (H) 135 - 145 mmol/L   Potassium 3.4 (L) 3.5 - 5.1 mmol/L   Chloride 109 98 -  111 mmol/L   CO2 24 22 - 32 mmol/L   Glucose, Bld 140 (H) 70 - 99 mg/dL   BUN 32 (H) 8 - 23 mg/dL   Creatinine, Ser 6.04 (H) 0.44 - 1.00 mg/dL   Calcium 8.6 (L) 8.9 - 10.3 mg/dL   Total Protein 6.6 6.5 - 8.1 g/dL   Albumin 3.5 3.5 - 5.0 g/dL   AST 23 15 - 41 U/L   ALT 14 0 - 44 U/L   Alkaline Phosphatase 81 38 - 126 U/L   Total Bilirubin 0.6 0.3 - 1.2 mg/dL   GFR calc non Af Amer 42 (L) >60 mL/min   GFR calc Af Amer 49 (L) >60 mL/min   Anion gap 13 5 - 15    Comment: Performed at [facility], [address], [place], [place] [address]  Magnesium     Status: None   Collection Time: 12/31/18 10:10 AM  Result Value Ref Range   Magnesium 1.9 1.7 - 2.4 mg/dL    Comment: Performed at [facility], [address], [place], [place] [address]  Lipase, blood     Status: None   Collection Time: 12/31/18 10:10 AM  Result Value Ref Range   Lipase 18 11 - 51 U/L    Comment: Performed at [facility], [address] [place], [place] [address]  Glucose, capillary     Status: Abnormal   Collection Time: 12/31/18 12:19 PM  Result Value Ref Range   Glucose-Capillary 133 (H) 70 - 99 mg/dL  Glucose, capillary     Status: Abnormal   Collection Time: 12/31/18  4:42 PM  Result Value Ref Range   Glucose-Capillary 139 (H) 70 - 99 mg/dL  Glucose, capillary     Status: Abnormal   Collection Time: 12/31/18  7:35 PM  Result Value Ref Range   Glucose-Capillary 123 (H) 70 - 99 mg/dL  Glucose, capillary     Status: Abnormal   Collection Time: 01/01/19 12:07 AM  Result Value Ref Range   Glucose-Capillary 137 (H) 70 - 99 mg/dL  Glucose, capillary     Status: Abnormal   Collection Time: 01/01/19  4:02 AM  Result Value Ref Range   Glucose-Capillary 137 (H) 70 - 99 mg/dL  CBC with Differential/Platelet     Status: Abnormal   Collection Time: 01/01/19  6:36 AM  Result Value Ref Range   WBC 7.4 4.0 - 10.5 K/uL   RBC 3.79 (L) 3.87 - 5.11 MIL/uL  Hemoglobin 10.0 (L) 12.0 - 15.0 g/dL   HCT 16.1 (L) 09.6 - 04.5 %   MCV 89.2 80.0 - 100.0 fL   MCH 26.4 26.0 - 34.0 pg   MCHC 29.6 (L) 30.0 - 36.0 g/dL   RDW 40.9 (H) 81.1 - 91.4 %   Platelets 156 150 - 400 K/uL   nRBC 0.0 0.0 - 0.2 %   Neutrophils Relative % 71 %   Neutro Abs 5.2 1.7 - 7.7 K/uL   Lymphocytes Relative 14 %   Lymphs Abs 1.0 0.7 - 4.0 K/uL   Monocytes Relative 14 %   Monocytes Absolute 1.0 0.1 - 1.0 K/uL   Eosinophils Relative 0 %   Eosinophils Absolute 0.0 0.0 - 0.5 K/uL   Basophils Relative 0 %   Basophils Absolute 0.0 0.0 - 0.1 K/uL   Immature Granulocytes 1 %   Abs Immature Granulocytes 0.05 0.00 - 0.07 K/uL    Comment: Performed at [facility], [address], [place], [place] [address]  Comprehensive metabolic panel     Status: Abnormal   Collection Time: 01/01/19  6:36 AM  Result Value Ref Range   Sodium 149 (H) 135 - 145 mmol/L   Potassium 2.8 (L) 3.5 - 5.1 mmol/L    Comment: DELTA CHECK NOTED REPEATED TO VERIFY    Chloride 113 (H) 98 - 111 mmol/L   CO2 23 22 - 32 mmol/L   Glucose, Bld 150 (H) 70 - 99 mg/dL   BUN 33 (H) 8 - 23 mg/dL   Creatinine, Ser 6.21 (H) 0.44 - 1.00 mg/dL   Calcium 8.6 (L) 8.9 - 10.3 mg/dL   Total Protein 6.6 6.5 - 8.1 g/dL   Albumin 3.5 3.5 - 5.0 g/dL   AST 27 15 - 41 U/L   ALT 15 0 - 44 U/L   Alkaline Phosphatase 75 38 - 126 U/L   Total Bilirubin 1.3 (H) 0.3 - 1.2 mg/dL   GFR calc non Af Amer 40 (L) >60 mL/min   GFR calc Af Amer 46 (L) >60 mL/min   Anion gap 13 5 - 15    Comment: Performed at [facility], [address], [place], [place] [address]  Glucose, capillary     Status: Abnormal   Collection Time: 01/01/19  7:52 AM  Result Value Ref Range   Glucose-Capillary 143 (H) 70 - 99 mg/dL  Glucose, capillary     Status: Abnormal   Collection Time: 01/01/19 11:30 AM  Result Value Ref Range   Glucose-Capillary 106 (H) 70 - 99 mg/dL  Glucose, capillary     Status: Abnormal    Collection Time: 01/01/19  4:46 PM  Result Value Ref Range   Glucose-Capillary 126 (H) 70 - 99 mg/dL  Glucose, capillary     Status: Abnormal   Collection Time: 01/01/19  9:39 PM  Result Value Ref Range   Glucose-Capillary 195 (H) 70 - 99 mg/dL  Glucose, capillary     Status: Abnormal   Collection Time: 01/01/19 11:29 PM  Result Value Ref Range   Glucose-Capillary 171 (H) 70 - 99 mg/dL  Glucose, capillary     Status: Abnormal   Collection Time: 01/02/19  4:45 AM  Result Value Ref Range   Glucose-Capillary 120 (H) 70 - 99 mg/dL  Glucose, capillary     Status: Abnormal   Collection Time: 01/02/19  7:49 AM  Result Value Ref Range   Glucose-Capillary 158 (H) 70 - 99 mg/dL    Imaging /  Studies: Dg Abd Portable 1v-small Bowel Obstruction Protocol-initial, 8 Hr Delay  Result Date: 12/31/2018 CLINICAL DATA:  Possible small bowel obstruction. 8 hour film after oral contrast administration. EXAM: PORTABLE ABDOMEN - 1 VIEW COMPARISON:  Single-view of the abdomen 12/31/2018 at 1347 hours FINDINGS: Contrast material is now seen throughout a decompressed colon. No small bowel dilatation is identified. NG tube remains in place. IMPRESSION: Negative for small bowel obstruction.  No acute finding. Electronically Signed   By: Drusilla Kanner M.D.   On: 12/31/2018 19:51   Dg Abd Portable 1v-small Bowel Protocol-position Verification  Result Date: 12/31/2018 CLINICAL DATA:  NG tube placement EXAM: PORTABLE ABDOMEN - 1 VIEW COMPARISON:  CT dated 12/29/2018. FINDINGS: The NG tube tip projects over the gastric antrum/pylorus. The tip is pointed distally. The bowel gas pattern is nonspecific and nonobstructive. Oral contrast is seen in several small bowel loops as well as the majority of the colon. IMPRESSION: NG tube tip projects over the gastric antrum/pylorus. Electronically Signed   By: Katherine Mantle M.D.   On: 12/31/2018 14:10    Medications / Allergies: per chart  Antibiotics: Anti-infectives  (From admission, onward)   Start     Dose/Rate Route Frequency Ordered Stop   12/31/18 1400  piperacillin-tazobactam (ZOSYN) IVPB 3.375 g     3.375 g 12.5 mL/hr over 240 Minutes Intravenous Every 8 hours 12/31/18 0856     12/31/18 0915  piperacillin-tazobactam (ZOSYN) IVPB 3.375 g     3.375 g 100 mL/hr over 30 Minutes Intravenous  Once 12/31/18 0900 12/31/18 1150        Note: Portions of this report may have been transcribed using voice recognition software. Every effort was made to ensure accuracy; however, inadvertent computerized transcription errors may be present.   Any transcriptional errors that result from this process are unintentional.     Ardeth Sportsman, MD, FACS, MASCRS Gastrointestinal and Minimally Invasive Surgery    [address], [address] [place], [place] [address] [phone] Main / Paging [phone] Fax

## 2019-01-02 NOTE — Progress Notes (Signed)
PROGRESS NOTE    [place]  UJW:[id number] DOB: January 26, 1926 DOA: 12/29/2018 PCP: Alysia PennaHolwerda, Scott, MD   Brief Narrative:  Sonya Chavez is an 85 year old pleasant female with a history of hypertension hyperlipidemia, type 2 diabetes mellitus, CAD status post CABG and history of hysterectomy and prior small bowel obstruction in March 2020 presented to ED on 12/29/2018 with a complaint of nausea, vomiting which started early a.m.  She did not have any abdominal pain, diarrhea, chills, fever, hematuria, dysuria or any other complaint.  Further work-up in the emergency department included CT of the abdomen and pelvis which revealed small bowel obstruction in left lower quadrant and possible additions.  Thick-walled gallbladder with large gallstone and possible acute cholecystitis.  She was subsequently admitted to hospital service and general surgery was consulted.  Ultrasound abdomen was done which again shows possible suspicion of acute cholecystitis.  She happens to be on Zosyn.  She has remained confused since past 2-3 days.  Afebrile with no leukocytosis.  Consultants:   General surgery  Procedures:   None  Antimicrobials:   Zosyn starting 12/31/2018   Subjective: Patient seen and examined earlier around 11 AM.  At that time, patient was slightly improved as far as confusion goes compared to yesterday but then while I was still on the floor, she was still yelling and had intermittent episodes of crying and confusion.  She denied any complaint.  She knew that she was in the hospital and she also was able to tell me the name of her husband Sonya Chavez.  Objective: Vitals:   01/01/19 1429 01/01/19 1824 01/01/19 1934 01/02/19 0449  BP: (!) 185/50 (!) 166/49 (!) 183/53 (!) 194/64  Pulse:   70 64  Resp:   17 18  Temp:   98.4 F (36.9 C) (!) 97.2 F (36.2 C)  TempSrc:   Oral Axillary  SpO2:   93% 93%  Weight:    57.3 kg  Height:        Intake/Output Summary (Last 24  hours) at 01/02/2019 1435 Last data filed at 01/02/2019 0808 Gross per 24 hour  Intake 480 ml  Output -  Net 480 ml   Filed Weights   12/31/18 0403 01/01/19 0500 01/02/19 0449  Weight: 59.5 kg 59.5 kg 57.3 kg    Examination:  General exam: Appears calm and comfortable but slightly confused Respiratory system: Clear to auscultation. Respiratory effort normal. Cardiovascular system: S1 & S2 heard, RRR. No JVD, murmurs, rubs, gallops or clicks. No pedal edema. Gastrointestinal system: Abdomen is nondistended, soft and right upper quadrant tenderness. No organomegaly or masses felt. Normal bowel sounds heard. Central nervous system: Alert and oriented x1. No focal neurological deficits. Extremities: Symmetric 5 x 5 power. Skin: No rashes, lesions or ulcers Psychiatry: Judgement and insight appear poor. Mood & affect flat.  Data Reviewed: I have personally reviewed following labs and imaging studies  CBC: Recent Labs  Lab 12/29/18 1519 12/30/18 0557 12/31/18 1010 01/01/19 0636 01/02/19 1008  WBC 11.5* 14.4* 9.7 7.4 10.8*  NEUTROABS 9.6*  --  7.5 5.2 8.9*  HGB 10.8* 9.9* 10.6* 10.0* 10.9*  HCT 35.3* 33.6* 35.2* 33.8* 36.1  MCV 84.9 87.7 89.3 89.2 87.2  PLT 186 164 167 156 185   Basic Metabolic Panel: Recent Labs  Lab 12/29/18 1519 12/30/18 0557 12/31/18 1010 01/01/19 0636 01/02/19 1008  NA 144 143 146* 149* 146*  K 4.0 3.7 3.4* 2.8* 2.5*  CL 104 108 109 113* 112*  CO2 28 26 24  23 24  GLUCOSE 195* 145* 140* 150* 267*  BUN 33* 34* 32* 33* 22  CREATININE 1.34* 1.31* 1.14* 1.19* 1.17*  CALCIUM 9.6 8.8* 8.6* 8.6* 8.5*  MG  --   --  1.9  --  1.7   GFR: Estimated Creatinine Clearance: 24.8 mL/min (A) (by C-G formula based on SCr of 1.17 mg/dL (H)). Liver Function Tests: Recent Labs  Lab 12/29/18 1519 12/30/18 0557 12/31/18 1010 01/01/19 0636 01/02/19 1008  AST 17 13* ALT ALKPHOS 98 74 81 75 75  BILITOT 0.8 0.7 0.6 1.3* 0.8  PROT 7.4  6.4* 6.6 6.6 6.3*  ALBUMIN 3.9 3.4* 3.5 3.5 3.1*   Recent Labs  Lab 12/29/18 1519 12/31/18 1010  LIPASE 26 18   No results for input(s): AMMONIA in the last 168 hours. Coagulation Profile: No results for input(s): INR, PROTIME in the last 168 hours. Cardiac Enzymes: No results for input(s): CKTOTAL, CKMB, CKMBINDEX, TROPONINI in the last 168 hours. BNP (last 3 results) No results for input(s): PROBNP in the last 8760 hours. HbA1C: No results for input(s): HGBA1C in the last 72 hours. CBG: Recent Labs  Lab 01/01/19 2139 01/01/19 2329 01/02/19 0445 01/02/19 0749 01/02/19 1129  GLUCAP 195* 171* 120* 158* 190*   Lipid Profile: No results for input(s): CHOL, HDL, LDLCALC, TRIG, CHOLHDL, LDLDIRECT in the last 72 hours. Thyroid Function Tests: No results for input(s): TSH, T4TOTAL, FREET4, T3FREE, THYROIDAB in the last 72 hours. Anemia Panel: No results for input(s): VITAMINB12, FOLATE, FERRITIN, TIBC, IRON, RETICCTPCT in the last 72 hours. Sepsis Labs: Recent Labs  Lab 01/02/19 1008  PROCALCITON 0.13    Recent Results (from the past 240 hour(s))  SARS Coronavirus 2 (CEPHEID - Performed in [facility] hospital lab), Hosp Order     Status: None   Collection Time: 12/29/18  6:14 PM  Result Value Ref Range Status   SARS Coronavirus 2 NEGATIVE NEGATIVE Final    Comment: (NOTE) If result is NEGATIVE SARS-CoV-2 target nucleic acids are NOT DETECTED. The SARS-CoV-2 RNA is generally detectable in upper and lower  respiratory specimens during the acute phase of infection. The lowest  concentration of SARS-CoV-2 viral copies this assay can detect is 250  copies / mL. A negative result does not preclude SARS-CoV-2 infection  and should not be used as the sole basis for treatment or other  patient management decisions.  A negative result may occur with  improper specimen collection / handling, submission of specimen other  than nasopharyngeal swab, presence of viral mutation(s)  within the  areas targeted by this assay, and inadequate number of viral copies  (<250 copies / mL). A negative result must be combined with clinical  observations, patient history, and epidemiological information. If result is POSITIVE SARS-CoV-2 target nucleic acids are DETECTED. The SARS-CoV-2 RNA is generally detectable in upper and lower  respiratory specimens dur ing the acute phase of infection.  Positive  results are indicative of active infection with SARS-CoV-2.  Clinical  correlation with patient history and other diagnostic information is  necessary to determine patient infection status.  Positive results do  not rule out bacterial infection or co-infection with other viruses. If result is PRESUMPTIVE POSTIVE SARS-CoV-2 nucleic acids MAY BE PRESENT.   A presumptive positive result was obtained on the submitted specimen  and confirmed on repeat testing.  While 2019 novel coronavirus  (SARS-CoV-2) nucleic acids may be present in the submitted sample  additional  confirmatory testing may be necessary for epidemiological  and / or clinical management purposes  to differentiate between  SARS-CoV-2 and other Sarbecovirus currently known to infect humans.  If clinically indicated additional testing with an alternate test  methodology [phone]) is advised. The SARS-CoV-2 RNA is generally  detectable in upper and lower respiratory sp ecimens during the acute  phase of infection. The expected result is Negative. Fact Sheet for Patients:  BoilerBrush.com.cy Fact Sheet for Healthcare Providers: https://pope.com/ This test is not yet approved or cleared by the [place] FDA and has been authorized for detection and/or diagnosis of SARS-CoV-2 by FDA under an Emergency Use Authorization (EUA).  This EUA will remain in effect (meaning this test can be used) for the duration of the COVID-19 declaration under Section 564(b)(1) of the Act,  21 U.S.C. section 360bbb-3(b)(1), unless the authorization is terminated or revoked sooner. Performed at [facility], [address], [place], [place] [address]       Radiology Studies: Dg Abd Portable 1v-small Bowel Obstruction Protocol-initial, 8 Hr Delay  Result Date: 12/31/2018 CLINICAL DATA:  Possible small bowel obstruction. 8 hour film after oral contrast administration. EXAM: PORTABLE ABDOMEN - 1 VIEW COMPARISON:  Single-view of the abdomen 12/31/2018 at 1347 hours FINDINGS: Contrast material is now seen throughout a decompressed colon. No small bowel dilatation is identified. NG tube remains in place. IMPRESSION: Negative for small bowel obstruction.  No acute finding. Electronically Signed   By: Drusilla Kanner M.D.   On: 12/31/2018 19:51    Scheduled Meds: . amLODipine  10 mg Oral QHS  . aspirin EC  81 mg Oral QHS  . carvedilol  12.5 mg Oral BID  . chlorthalidone  25 mg Oral Daily  . hydrALAZINE  5 mg Intravenous TID  . hydrALAZINE  25 mg Oral TID  . insulin aspart  0-9 Units Subcutaneous Q4H  . losartan  100 mg Oral Daily  . QUEtiapine  25 mg Oral QHS   Continuous Infusions: . sodium chloride 100 mL/hr at 01/02/19 0351  . piperacillin-tazobactam (ZOSYN)  IV 3.375 g (01/02/19 1418)  . potassium chloride 10 mEq (01/02/19 1343)     LOS: 4 days   Assessment & Plan:   Principal Problem:   SBO (small bowel obstruction) (HCC) Active Problems:   HTN (hypertension)   DM (diabetes mellitus) (HCC)   CAD (coronary artery disease)   Delirium due to another medical condition   AKI (acute kidney injury) (HCC)   Cholelithiasis  Small bowel obstruction: Underwent small bowel follow-through which was negative so she was ruled out of small bowel obstruction.  Surgery has seen her and cleared her to advance her diet.  She is tolerating full liquid.  Advancing to soft diet.  Possible acute cholecystitis with cholelithiasis: Ultrasound abdomen shows  possibility of acute cholecystitis.  Surgery does not seem to think she has cholecystitis.  Understandably, she is afebrile with no leukocytosis but she is tender to palpation on my examination.  I planned to proceed with HIDA scan however I was informed that nuclear medicine is not available today and on the weekend so we will hold off to that but in the meantime I will continue Zosyn for now.  Acute metabolic encephalopathy/delirium/agitation: Showing signs of delirium, likely secondary to acute infection versus hospitalization.  Has some improvement compared to yesterday.  Even at 11 AM when I see her, the lights were off in her room despite of long discussion with the nurses yesterday where I  advised them to keep her room lit.  Once again, I will continue Seroquel and no labs for the morning so she can rest.  Chronic kidney disease stage III: At baseline.  Continue to watch.  Hypertension: Blood pressure elevated.  Now that she is on diet so I will resume her home antihypertensives which are carvedilol, amlodipine and hydralazine.  Continue PRN hydralazine as well.  Type 2 diabetes mellitus: Controlled.  Continue SSI.  CAD status post CABG: Continue aspirin.  DVT prophylaxis: Lovenox and SCD Code Status: Full code Family Communication: Called patient's husband Sonya Chavez again today and updated him and answered all his questions. Disposition Plan: To be determined pending clinical improvement   Time spent: 30 minutes.   Hughie Closs, MD [facility] Pager [phone]  If 7PM-7AM, please contact night-coverage [email or web address] Password [email or web address] 01/02/2019, 2:35 PM

## 2019-01-02 NOTE — Progress Notes (Signed)
CRITICAL VALUE ALERT  Critical Value:  K+ 2.5  Date & Time Notied:  01/02/2019   11:32  Provider Notified: Dr. Jacqulyn Bath  Orders Received/Actions taken: waiting for orders

## 2019-01-02 NOTE — Progress Notes (Signed)
During shift patient very agiaited, pulling at lines and combative with staff. Patient climibng over rail. Small BM. Patient moaning and grimacing IV haldol given at 2324. IV Pain medicine given 2014 and 0144. Both meds ineffective. Patient refusing oral meds, will not open mouth and pushing at RN's hand. Bedtime meds not given. Discussed with [facility] sitter, no staff avalible. Providing emotional support to patient. Will continue to monitor.

## 2019-01-03 LAB — CBC WITH DIFFERENTIAL/PLATELET
Abs Immature Granulocytes: 0.16 10*3/uL — ABNORMAL HIGH (ref 0.00–0.07)
Basophils Absolute: 0 10*3/uL (ref 0.0–0.1)
Basophils Relative: 0 %
Eosinophils Absolute: 0 10*3/uL (ref 0.0–0.5)
Eosinophils Relative: 0 %
HCT: 37.2 % (ref 36.0–46.0)
Hemoglobin: 11.3 g/dL — ABNORMAL LOW (ref 12.0–15.0)
Immature Granulocytes: 1 %
Lymphocytes Relative: 18 %
Lymphs Abs: 2.2 10*3/uL (ref 0.7–4.0)
MCH: 26 pg (ref 26.0–34.0)
MCHC: 30.4 g/dL (ref 30.0–36.0)
MCV: 85.7 fL (ref 80.0–100.0)
Monocytes Absolute: 1.4 10*3/uL — ABNORMAL HIGH (ref 0.1–1.0)
Monocytes Relative: 11 %
Neutro Abs: 8.5 10*3/uL — ABNORMAL HIGH (ref 1.7–7.7)
Neutrophils Relative %: 70 %
Platelets: 190 10*3/uL (ref 150–400)
RBC: 4.34 MIL/uL (ref 3.87–5.11)
RDW: 15.5 % (ref 11.5–15.5)
WBC: 12.3 10*3/uL — ABNORMAL HIGH (ref 4.0–10.5)
nRBC: 0 % (ref 0.0–0.2)

## 2019-01-03 LAB — COMPREHENSIVE METABOLIC PANEL
ALT: 16 U/L (ref 0–44)
AST: 27 U/L (ref 15–41)
Albumin: 2.9 g/dL — ABNORMAL LOW (ref 3.5–5.0)
Alkaline Phosphatase: 67 U/L (ref 38–126)
Anion gap: 9 (ref 5–15)
BUN: 19 mg/dL (ref 8–23)
CO2: 24 mmol/L (ref 22–32)
Calcium: 8.5 mg/dL — ABNORMAL LOW (ref 8.9–10.3)
Chloride: 112 mmol/L — ABNORMAL HIGH (ref 98–111)
Creatinine, Ser: 1.24 mg/dL — ABNORMAL HIGH (ref 0.44–1.00)
GFR calc Af Amer: 44 mL/min — ABNORMAL LOW (ref >60)
GFR calc non Af Amer: 38 mL/min — ABNORMAL LOW (ref >60)
Glucose, Bld: 116 mg/dL — ABNORMAL HIGH (ref 70–99)
Potassium: 3 mmol/L — ABNORMAL LOW (ref 3.5–5.1)
Sodium: 145 mmol/L (ref 135–145)
Total Bilirubin: 0.4 mg/dL (ref 0.3–1.2)
Total Protein: 5.9 g/dL — ABNORMAL LOW (ref 6.5–8.1)

## 2019-01-03 LAB — GLUCOSE, CAPILLARY
Glucose-Capillary: 103 mg/dL — ABNORMAL HIGH (ref 70–99)
Glucose-Capillary: 110 mg/dL — ABNORMAL HIGH (ref 70–99)
Glucose-Capillary: 113 mg/dL — ABNORMAL HIGH (ref 70–99)
Glucose-Capillary: 144 mg/dL — ABNORMAL HIGH (ref 70–99)
Glucose-Capillary: 188 mg/dL — ABNORMAL HIGH (ref 70–99)
Glucose-Capillary: 310 mg/dL — ABNORMAL HIGH (ref 70–99)

## 2019-01-03 MED ORDER — POTASSIUM CHLORIDE 10 MEQ/100ML IV SOLN: 10.0000 meq | INTRAVENOUS | Status: DC

## 2019-01-03 MED ORDER — HYDROMORPHONE HCL 1 MG/ML IJ SOLN
0.5000 mg | INTRAMUSCULAR | Status: DC | PRN
  Administered 2019-01-03 – 2019-01-04 (×2): 0.5 mg via INTRAVENOUS
  Filled 2019-01-03 (×2): qty 0.5

## 2019-01-03 MED ORDER — POTASSIUM CHLORIDE CRYS ER 20 MEQ PO TBCR
40.0000 meq | EXTENDED_RELEASE_TABLET | Freq: Two times a day (BID) | ORAL | Status: DC
  Administered 2019-01-03 – 2019-01-05 (×4): 40 meq via ORAL
  Filled 2019-01-03 (×5): qty 2

## 2019-01-03 MED ORDER — POTASSIUM CHLORIDE 10 MEQ/100ML IV SOLN
10.0000 meq | INTRAVENOUS | Status: DC
  Filled 2019-01-03: qty 100

## 2019-01-03 NOTE — Progress Notes (Signed)
PT Cancellation Note  Patient Details Name: Sonya Chavez MRN: [id number] DOB: Oct 23, 1925   Cancelled Treatment:     PT order received but eval deferred at request of RN: Pt had been up in chair with nursing but just back to bed and very fatigued.  Will follow.   Dalisa Forrer 01/03/2019, 2:33 PM

## 2019-01-03 NOTE — Progress Notes (Signed)
[place] Sonya Chavez [id number] Feb 10, 1926  CARE TEAM:  PCP: Alysia Penna, MD  Outpatient Care Team: Patient Care Team: Alysia Penna, MD as PCP - General (Internal Medicine)  Inpatient Treatment Team: Treatment Team: Attending Provider: Hughie Closs, MD; Rounding Team: Delmer Islam, MD; Registered Nurse: Johney Frame, RN; Rounding Team: Bishop Limbo, MD; Consulting Physician: Darnell Level, MD; Technician: Karlyn Agee, [place]; Registered Nurse: Domingo Dimes, RN   Problem List:   Principal Problem:   SBO (small bowel obstruction) (HCC) Active Problems:   HTN (hypertension)   DM (diabetes mellitus) (HCC)   CAD (coronary artery disease)   Delirium due to another medical condition   AKI (acute kidney injury) (HCC)   Cholelithiasis      * No surgery found *      Assessment  No strong evidence of any intra-abdominal issues  [facility] Stay = 5 days)  Plan:  SBO resolved.  - SB protocol show contrast throughout the colon/No SBO currently  - advance diet as tolerated  Cholelithiasis  -Giant calcified gallstone fills up a fair amount of the gallbladder by CT scan and ultrasound.  Normal LFT's  Non tender on exam for the past 3 days, although challenged with her fair mental status.  Suspect the gallstone is incidental.  I am skeptical that a 85 year old gallbladder is not going to particularly normal.  Does not seem particularly inflamed to my view.  No Murphy sign.    Medicine is hedging and ordered a nuclear medicine HIDA scan.  While technically possible to do cholecystectomy, the challenge would be is this 85 year old fragile patient a surgical candidate?  Would need medical and cardiac clearance before thinking it.  More likely would do percutaneous IR drainage of the gallbladder if there truly is cystic duct obstruction.  FEN: IV fluids/n.p.o. [id number] ordered by [facility]; Medicine DVT: Lovenox Follow-up: To be determined   15 minutes spent  in review, evaluation, examination, counseling, and coordination of care.  More than 50% of that time was spent in counseling.  01/03/2019    Subjective: (Chief complaint)  No major events.  Less confused.  Not needing temporary restraints.  Denies abdominal pain.  Not much of an appetite but certainly no abdominal pain.  Objective:  Vital signs:  Vitals:   01/02/19 0449 01/02/19 1515 01/02/19 1934 01/03/19 0452  BP: (!) 194/64 (!) 175/70 (!) 170/78 (!) 161/94  Pulse: 64 67 79 79  Resp: Temp: (!) 97.2 F (36.2 C) 97.9 F (36.6 C) 97.6 F (36.4 C) 98 F (36.7 C)  TempSrc: Axillary Axillary Oral Oral  SpO2: 93% 94% 95% 96%  Weight: 57.3 kg   59.6 kg  Height:        Last BM Date: 01/01/19  Intake/Output   Yesterday:  05/30 0701 - 05/31 0700 In: 1190 [P.O.:240; I.V.:900; IV Piggyback:50] Out: 550 [Urine:550] This shift:  No intake/output data recorded.  Bowel function:  Flatus: YES  BM:  YES  Drain: (No drain)   Physical Exam:  General: Pt awake awakens in no acute distress.  Not very interactive but answer simple questions and follows some commands.  Not in any acute distress Eyes: PERRL, normal EOM.  Sclera clear.  No icterus Neuro: CN II-XII intact w/o focal sensory/motor deficits. Lymph: No head/neck/groin lymphadenopathy Psych:  No delerium/psychosis/paranoia HENT: Normocephalic, Mucus membranes moist.  No thrush Neck: Supple, No tracheal deviation Chest: No chest wall pain w good excursion CV:  Pulses intact.  Regular  rhythm MS: Normal AROM mjr joints.  No obvious deformity  Abdomen: Soft.  Nondistended.  Nontender.  No evidence of peritonitis.  No incarcerated hernias.  Ext: Hand mittens in place.  No deformity.  No mjr edema.  No cyanosis Skin: No petechiae / purpura  Results:   Cultures: Recent Results (from the past 720 hour(s))  SARS Coronavirus 2 (CEPHEID - Performed in [facility] hospital lab), Hosp Order     Status:  None   Collection Time: 12/29/18  6:14 PM  Result Value Ref Range Status   SARS Coronavirus 2 NEGATIVE NEGATIVE Final    Comment: (NOTE) If result is NEGATIVE SARS-CoV-2 target nucleic acids are NOT DETECTED. The SARS-CoV-2 RNA is generally detectable in upper and lower  respiratory specimens during the acute phase of infection. The lowest  concentration of SARS-CoV-2 viral copies this assay can detect is 250  copies / mL. A negative result does not preclude SARS-CoV-2 infection  and should not be used as the sole basis for treatment or other  patient management decisions.  A negative result may occur with  improper specimen collection / handling, submission of specimen other  than nasopharyngeal swab, presence of viral mutation(s) within the  areas targeted by this assay, and inadequate number of viral copies  (<250 copies / mL). A negative result must be combined with clinical  observations, patient history, and epidemiological information. If result is POSITIVE SARS-CoV-2 target nucleic acids are DETECTED. The SARS-CoV-2 RNA is generally detectable in upper and lower  respiratory specimens dur ing the acute phase of infection.  Positive  results are indicative of active infection with SARS-CoV-2.  Clinical  correlation with patient history and other diagnostic information is  necessary to determine patient infection status.  Positive results do  not rule out bacterial infection or co-infection with other viruses. If result is PRESUMPTIVE POSTIVE SARS-CoV-2 nucleic acids MAY BE PRESENT.   A presumptive positive result was obtained on the submitted specimen  and confirmed on repeat testing.  While 2019 novel coronavirus  (SARS-CoV-2) nucleic acids may be present in the submitted sample  additional confirmatory testing may be necessary for epidemiological  and / or clinical management purposes  to differentiate between  SARS-CoV-2 and other Sarbecovirus currently known to infect  humans.  If clinically indicated additional testing with an alternate test  methodology [phone]) is advised. The SARS-CoV-2 RNA is generally  detectable in upper and lower respiratory sp ecimens during the acute  phase of infection. The expected result is Negative. Fact Sheet for Patients:  BoilerBrush.com.cy Fact Sheet for Healthcare Providers: https://pope.com/ This test is not yet approved or cleared by the [place] FDA and has been authorized for detection and/or diagnosis of SARS-CoV-2 by FDA under an Emergency Use Authorization (EUA).  This EUA will remain in effect (meaning this test can be used) for the duration of the COVID-19 declaration under Section 564(b)(1) of the Act, 21 U.S.C. section 360bbb-3(b)(1), unless the authorization is terminated or revoked sooner. Performed at [facility], [address] [place], [place] [address]     Labs: Results for orders placed or performed during the hospital encounter of 12/29/18 (from the past 48 hour(s))  Glucose, capillary     Status: Abnormal   Collection Time: 01/01/19 11:30 AM  Result Value Ref Range   Glucose-Capillary 106 (H) 70 - 99 mg/dL  Glucose, capillary     Status: Abnormal   Collection Time: 01/01/19  4:46 PM  Result Value Ref Range  Glucose-Capillary 126 (H) 70 - 99 mg/dL  Glucose, capillary     Status: Abnormal   Collection Time: 01/01/19  9:39 PM  Result Value Ref Range   Glucose-Capillary 195 (H) 70 - 99 mg/dL  Glucose, capillary     Status: Abnormal   Collection Time: 01/01/19 11:29 PM  Result Value Ref Range   Glucose-Capillary 171 (H) 70 - 99 mg/dL  Glucose, capillary     Status: Abnormal   Collection Time: 01/02/19  4:45 AM  Result Value Ref Range   Glucose-Capillary 120 (H) 70 - 99 mg/dL  Glucose, capillary     Status: Abnormal   Collection Time: 01/02/19  7:49 AM  Result Value Ref Range   Glucose-Capillary 158 (H) 70 - 99  mg/dL  CBC with Differential/Platelet     Status: Abnormal   Collection Time: 01/02/19 10:08 AM  Result Value Ref Range   WBC 10.8 (H) 4.0 - 10.5 K/uL   RBC 4.14 3.87 - 5.11 MIL/uL   Hemoglobin 10.9 (L) 12.0 - 15.0 g/dL   HCT 16.136.1 09.636.0 - 04.546.0 %   MCV 87.2 80.0 - 100.0 fL   MCH 26.3 26.0 - 34.0 pg   MCHC 30.2 30.0 - 36.0 g/dL   RDW 40.915.4 81.111.5 - 91.415.5 %   Platelets 185 150 - 400 K/uL   nRBC 0.0 0.0 - 0.2 %   Neutrophils Relative % 82 %   Neutro Abs 8.9 (H) 1.7 - 7.7 K/uL   Lymphocytes Relative 9 %   Lymphs Abs 1.0 0.7 - 4.0 K/uL   Monocytes Relative 8 %   Monocytes Absolute 0.8 0.1 - 1.0 K/uL   Eosinophils Relative 0 %   Eosinophils Absolute 0.0 0.0 - 0.5 K/uL   Basophils Relative 0 %   Basophils Absolute 0.0 0.0 - 0.1 K/uL   Immature Granulocytes 1 %   Abs Immature Granulocytes 0.13 (H) 0.00 - 0.07 K/uL    Comment: Performed at [facility], [address] [place], [place] [address]  Comprehensive metabolic panel     Status: Abnormal   Collection Time: 01/02/19 10:08 AM  Result Value Ref Range   Sodium 146 (H) 135 - 145 mmol/L   Potassium 2.5 (LL) 3.5 - 5.1 mmol/L    Comment: CRITICAL RESULT CALLED TO, READ BACK BY AND VERIFIED WITH: EPLING,G AT 1127 ON 01/02/2019 BY JPM    Chloride 112 (H) 98 - 111 mmol/L   CO2 24 22 - 32 mmol/L   Glucose, Bld 267 (H) 70 - 99 mg/dL   BUN 22 8 - 23 mg/dL   Creatinine, Ser 6.211.17 (H) 0.44 - 1.00 mg/dL   Calcium 8.5 (L) 8.9 - 10.3 mg/dL   Total Protein 6.3 (L) 6.5 - 8.1 g/dL   Albumin 3.1 (L) 3.5 - 5.0 g/dL   AST 27 15 - 41 U/L   ALT 17 0 - 44 U/L   Alkaline Phosphatase 75 38 - 126 U/L   Total Bilirubin 0.8 0.3 - 1.2 mg/dL   GFR calc non Af Amer 41 (L) >60 mL/min   GFR calc Af Amer 47 (L) >60 mL/min   Anion gap 10 5 - 15    Comment: Performed at [facility], [address] [place], [place] [address]  Magnesium     Status: None   Collection Time: 01/02/19 10:08 AM  Result Value Ref Range    Magnesium 1.7 1.7 - 2.4 mg/dL    Comment: Performed at [facility],  [address], [place], [place] [address]  Procalcitonin - Baseline     Status: None   Collection Time: 01/02/19 10:08 AM  Result Value Ref Range   Procalcitonin 0.13 ng/mL    Comment:        Interpretation: PCT (Procalcitonin) <= 0.5 ng/mL: Systemic infection (sepsis) is not likely. Local bacterial infection is possible. (NOTE)       Sepsis PCT Algorithm           Lower Respiratory Tract                                      Infection PCT Algorithm    ----------------------------     ----------------------------         PCT < 0.25 ng/mL                PCT < 0.10 ng/mL         Strongly encourage             Strongly discourage   discontinuation of antibiotics    initiation of antibiotics    ----------------------------     -----------------------------       PCT 0.25 - 0.50 ng/mL            PCT 0.10 - 0.25 ng/mL               OR       >80% decrease in PCT            Discourage initiation of                                            antibiotics      Encourage discontinuation           of antibiotics    ----------------------------     -----------------------------         PCT >= 0.50 ng/mL              PCT 0.26 - 0.50 ng/mL               AND        <80% decrease in PCT             Encourage initiation of                                             antibiotics       Encourage continuation           of antibiotics    ----------------------------     -----------------------------        PCT >= 0.50 ng/mL                  PCT > 0.50 ng/mL               AND         increase in PCT                  Strongly encourage  initiation of antibiotics    Strongly encourage escalation           of antibiotics                                     -----------------------------                                           PCT <= 0.25 ng/mL                                                  OR                                        > 80% decrease in PCT                                     Discontinue / Do not initiate                                             antibiotics Performed at [facility], [address] [place], [place] [address]   Glucose, capillary     Status: Abnormal   Collection Time: 01/02/19 11:29 AM  Result Value Ref Range   Glucose-Capillary 190 (H) 70 - 99 mg/dL  Glucose, capillary     Status: Abnormal   Collection Time: 01/02/19  3:36 PM  Result Value Ref Range   Glucose-Capillary 157 (H) 70 - 99 mg/dL  Glucose, capillary     Status: Abnormal   Collection Time: 01/02/19  7:31 PM  Result Value Ref Range   Glucose-Capillary 210 (H) 70 - 99 mg/dL  Glucose, capillary     Status: Abnormal   Collection Time: 01/03/19 12:13 AM  Result Value Ref Range   Glucose-Capillary 188 (H) 70 - 99 mg/dL  Glucose, capillary     Status: Abnormal   Collection Time: 01/03/19  4:49 AM  Result Value Ref Range   Glucose-Capillary 110 (H) 70 - 99 mg/dL  CBC with Differential/Platelet     Status: Abnormal   Collection Time: 01/03/19  5:12 AM  Result Value Ref Range   WBC 12.3 (H) 4.0 - 10.5 K/uL   RBC 4.34 3.87 - 5.11 MIL/uL   Hemoglobin 11.3 (L) 12.0 - 15.0 g/dL   HCT 13.0 86.5 - 78.4 %   MCV 85.7 80.0 - 100.0 fL   MCH 26.0 26.0 - 34.0 pg   MCHC 30.4 30.0 - 36.0 g/dL   RDW 69.6 29.5 - 28.4 %   Platelets 190 150 - 400 K/uL   nRBC 0.0 0.0 - 0.2 %   Neutrophils Relative % 70 %   Neutro Abs 8.5 (H) 1.7 - 7.7 K/uL   Lymphocytes Relative 18 %   Lymphs Abs 2.2 0.7 - 4.0 K/uL   Monocytes Relative 11 %   Monocytes Absolute 1.4 (H)  0.1 - 1.0 K/uL   Eosinophils Relative 0 %   Eosinophils Absolute 0.0 0.0 - 0.5 K/uL   Basophils Relative 0 %   Basophils Absolute 0.0 0.0 - 0.1 K/uL   Immature Granulocytes 1 %   Abs Immature Granulocytes 0.16 (H) 0.00 - 0.07 K/uL    Comment: Performed at [facility], [address], [place], [place] [address]  Comprehensive metabolic panel     Status: Abnormal   Collection Time: 01/03/19  5:12 AM  Result Value Ref Range   Sodium 145 135 - 145 mmol/L   Potassium 3.0 (L) 3.5 - 5.1 mmol/L   Chloride 112 (H) 98 - 111 mmol/L   CO2 24 22 - 32 mmol/L   Glucose, Bld 116 (H) 70 - 99 mg/dL   BUN 19 8 - 23 mg/dL   Creatinine, Ser 6.57 (H) 0.44 - 1.00 mg/dL   Calcium 8.5 (L) 8.9 - 10.3 mg/dL   Total Protein 5.9 (L) 6.5 - 8.1 g/dL   Albumin 2.9 (L) 3.5 - 5.0 g/dL   AST 27 15 - 41 U/L   ALT 16 0 - 44 U/L   Alkaline Phosphatase 67 38 - 126 U/L   Total Bilirubin 0.4 0.3 - 1.2 mg/dL   GFR calc non Af Amer 38 (L) >60 mL/min   GFR calc Af Amer 44 (L) >60 mL/min   Anion gap 9 5 - 15    Comment: Performed at [facility], [address], [place], [place] [address]  Glucose, capillary     Status: Abnormal   Collection Time: 01/03/19  7:47 AM  Result Value Ref Range   Glucose-Capillary 113 (H) 70 - 99 mg/dL    Imaging / Studies: No results found.  Medications / Allergies: per chart  Antibiotics: Anti-infectives (From admission, onward)   Start     Dose/Rate Route Frequency Ordered Stop   12/31/18 1400  piperacillin-tazobactam (ZOSYN) IVPB 3.375 g     3.375 g 12.5 mL/hr over 240 Minutes Intravenous Every 8 hours 12/31/18 0856     12/31/18 0915  piperacillin-tazobactam (ZOSYN) IVPB 3.375 g     3.375 g 100 mL/hr over 30 Minutes Intravenous  Once 12/31/18 0900 12/31/18 1150        Note: Portions of this report may have been transcribed using voice recognition software. Every effort was made to ensure accuracy; however, inadvertent computerized transcription errors may be present.   Any transcriptional errors that result from this process are unintentional.     Ardeth Sportsman, MD, FACS, MASCRS Gastrointestinal and Minimally Invasive Surgery    [address], [address] [place], [place] [address] [phone] Main / Paging [phone]  Fax

## 2019-01-03 NOTE — Progress Notes (Signed)
PROGRESS NOTE    Sonya [place] M Ibbotson  VQQ:[id number] DOB: 04/24/1926 DOA: 12/29/2018 PCP: Alysia Penna, MD   Brief Narrative:  Sonya Chavez is a 85 year old pleasant female with a history of hypertension hyperlipidemia, type 2 diabetes mellitus, CAD status post CABG and history of hysterectomy and prior small bowel obstruction in March 2020 presented to ED on 12/29/2018 with a complaint of nausea, vomiting which started early a.m.  She did not have any abdominal pain, diarrhea, chills, fever, hematuria, dysuria or any other complaint.  Further work-up in the emergency department included CT of the abdomen and pelvis which revealed small bowel obstruction in left lower quadrant and possible additions.  Thick-walled gallbladder with large gallstone and possible acute cholecystitis.  She was subsequently admitted to hospital service and general surgery was consulted.  Ultrasound abdomen was done which again shows possible suspicion of acute cholecystitis.  She happens to be on Zosyn.  She has remained confused since past 2-3 days.  Afebrile with no leukocytosis.  Consultants:   General surgery  Procedures:   None  Antimicrobials:   Zosyn starting 12/31/2018   Subjective: Patient seen and examined.  She has improved significantly.  She is much more alert and oriented now than yesterday.  Nursing also confirmed that she has been doing much better.  She had no complaint.  She knew that she was in the [facility] and in [facility].  Objective: Vitals:   01/02/19 0449 01/02/19 1515 01/02/19 1934 01/03/19 0452  BP: (!) 194/64 (!) 175/70 (!) 170/78 (!) 161/94  Pulse: 64 67 79 79  Resp: 18 18 20 17   Temp: (!) 97.2 F (36.2 C) 97.9 F (36.6 C) 97.6 F (36.4 C) 98 F (36.7 C)  TempSrc: Axillary Axillary Oral Oral  SpO2: 93% 94% 95% 96%  Weight: 57.3 kg   59.6 kg  Height:        Intake/Output Summary (Last 24 hours) at 01/03/2019 1318 Last data filed at  01/02/2019 2236 Gross per 24 hour  Intake 950 ml  Output 550 ml  Net 400 ml   Filed Weights   01/01/19 0500 01/02/19 0449 01/03/19 0452  Weight: 59.5 kg 57.3 kg 59.6 kg    Examination:  General exam: Appears calm and comfortable  Respiratory system: Clear to auscultation. Respiratory effort normal. Cardiovascular system: S1 & S2 heard, RRR. No JVD, murmurs, rubs, gallops or clicks. No pedal edema. Gastrointestinal system: Abdomen is nondistended, soft and nontender. No organomegaly or masses felt. Normal bowel sounds heard. Central nervous system: Alert and oriented. No focal neurological deficits. Extremities: Symmetric 5 x 5 power. Skin: No rashes, lesions or ulcers Psychiatry: Judgement and insight appear normal. Mood & affect appropriate.    Data Reviewed: I have personally reviewed following labs and imaging studies  CBC: Recent Labs  Lab 12/29/18 1519 12/30/18 0557 12/31/18 1010 01/01/19 0636 01/02/19 1008 01/03/19 0512  WBC 11.5* 14.4* 9.7 7.4 10.8* 12.3*  NEUTROABS 9.6*  --  7.5 5.2 8.9* 8.5*  HGB 10.8* 9.9* 10.6* 10.0* 10.9* 11.3*  HCT 35.3* 33.6* 35.2* 33.8* 36.1 37.2  MCV 84.9 87.7 89.3 89.2 87.2 85.7  PLT 186 164 167 156 185 190   Basic Metabolic Panel: Recent Labs  Lab 12/30/18 0557 12/31/18 1010 01/01/19 0636 01/02/19 1008 01/03/19 0512  NA 143 146* 149* 146* 145  K 3.7 3.4* 2.8* 2.5* 3.0*  CL 108 109 113* 112* 112*  CO2 26 24 23 24 24   GLUCOSE 145* 140* 150* 267* 116*  BUN 34* 32* 33* 22 19  CREATININE 1.31* 1.14* 1.19* 1.17* 1.24*  CALCIUM 8.8* 8.6* 8.6* 8.5* 8.5*  MG  --  1.9  --  1.7  --    GFR: Estimated Creatinine Clearance: 23.4 mL/min (A) (by C-G formula based on SCr of 1.24 mg/dL (H)). Liver Function Tests: Recent Labs  Lab 12/30/18 0557 12/31/18 1010 01/01/19 0636 01/02/19 1008 01/03/19 0512  AST 13* ALT ALKPHOS 74 81 75 75 67  BILITOT 0.7 0.6 1.3* 0.8 0.4  PROT 6.4* 6.6 6.6 6.3* 5.9*  ALBUMIN  3.4* 3.5 3.5 3.1* 2.9*   Recent Labs  Lab 12/29/18 1519 12/31/18 1010  LIPASE 26 18   No results for input(s): AMMONIA in the last 168 hours. Coagulation Profile: No results for input(s): INR, PROTIME in the last 168 hours. Cardiac Enzymes: No results for input(s): CKTOTAL, CKMB, CKMBINDEX, TROPONINI in the last 168 hours. BNP (last 3 results) No results for input(s): PROBNP in the last 8760 hours. HbA1C: No results for input(s): HGBA1C in the last 72 hours. CBG: Recent Labs  Lab 01/02/19 1931 01/03/19 0013 01/03/19 0449 01/03/19 0747 01/03/19 1147  GLUCAP 210* 188* 110* 113* 310*   Lipid Profile: No results for input(s): CHOL, HDL, LDLCALC, TRIG, CHOLHDL, LDLDIRECT in the last 72 hours. Thyroid Function Tests: No results for input(s): TSH, T4TOTAL, FREET4, T3FREE, THYROIDAB in the last 72 hours. Anemia Panel: No results for input(s): VITAMINB12, FOLATE, FERRITIN, TIBC, IRON, RETICCTPCT in the last 72 hours. Sepsis Labs: Recent Labs  Lab 01/02/19 1008  PROCALCITON 0.13    Recent Results (from the past 240 hour(s))  SARS Coronavirus 2 (CEPHEID - Performed in [facility] hospital lab), Hosp Order     Status: None   Collection Time: 12/29/18  6:14 PM  Result Value Ref Range Status   SARS Coronavirus 2 NEGATIVE NEGATIVE Final    Comment: (NOTE) If result is NEGATIVE SARS-CoV-2 target nucleic acids are NOT DETECTED. The SARS-CoV-2 RNA is generally detectable in upper and lower  respiratory specimens during the acute phase of infection. The lowest  concentration of SARS-CoV-2 viral copies this assay can detect is 250  copies / mL. A negative result does not preclude SARS-CoV-2 infection  and should not be used as the sole basis for treatment or other  patient management decisions.  A negative result may occur with  improper specimen collection / handling, submission of specimen other  than nasopharyngeal swab, presence of viral mutation(s) within the  areas targeted  by this assay, and inadequate number of viral copies  (<250 copies / mL). A negative result must be combined with clinical  observations, patient history, and epidemiological information. If result is POSITIVE SARS-CoV-2 target nucleic acids are DETECTED. The SARS-CoV-2 RNA is generally detectable in upper and lower  respiratory specimens dur ing the acute phase of infection.  Positive  results are indicative of active infection with SARS-CoV-2.  Clinical  correlation with patient history and other diagnostic information is  necessary to determine patient infection status.  Positive results do  not rule out bacterial infection or co-infection with other viruses. If result is PRESUMPTIVE POSTIVE SARS-CoV-2 nucleic acids MAY BE PRESENT.   A presumptive positive result was obtained on the submitted specimen  and confirmed on repeat testing.  While 2019 novel coronavirus  (SARS-CoV-2) nucleic acids may be present in the submitted sample  additional confirmatory testing may be necessary for epidemiological  and /  or clinical management purposes  to differentiate between  SARS-CoV-2 and other Sarbecovirus currently known to infect humans.  If clinically indicated additional testing with an alternate test  methodology ([id number]) is advised. The SARS-CoV-2 RNA is generally  detectable in upper and lower respiratory sp ecimens during the acute  phase of infection. The [phone] result is Negative. Fact Sheet for Patients:  BoilerBrush.com.cyhttps://www.fda.gov/media/136312/download Fact Sheet for Healthcare Providers: https://pope.com/https://www.fda.gov/media/136313/download This test is not yet approved or cleared by the [place] FDA and has been authorized for detection and/or diagnosis of SARS-CoV-2 by FDA under an Emergency Use Authorization (EUA).  This EUA will remain in effect (meaning this test can be used) for the duration of the COVID-19 declaration under Section 564(b)(1) of the Act, 21 U.S.C. section  360bbb-3(b)(1), unless the authorization is terminated or revoked sooner. Performed at [facility], [address] [place], [place] [id number]       Radiology Studies: No results found.  Scheduled Meds: . amLODipine  10 mg Oral QHS  . aspirin EC  81 mg Oral QHS  . carvedilol  12.5 mg Oral BID  . chlorthalidone  25 mg Oral Daily  . hydrALAZINE  5 mg Intravenous TID  . hydrALAZINE  25 mg Oral TID  . insulin aspart  0-9 Units Subcutaneous Q4H  . losartan  100 mg Oral Daily  . potassium chloride  40 mEq Oral BID  . QUEtiapine  25 mg Oral QHS   Continuous Infusions: . sodium chloride 100 mL/hr at 01/02/19 1855  . piperacillin-tazobactam (ZOSYN)  IV 3.375 g (01/03/19 0600)     LOS: 5 days   Assessment & Plan:   Principal Problem:   SBO (small bowel obstruction) (HCC) Active Problems:   HTN (hypertension)   DM (diabetes mellitus) (HCC)   CAD (coronary artery disease)   Delirium due to another medical condition   AKI (acute kidney injury) (HCC)   Cholelithiasis  Small bowel obstruction: Underwent small bowel follow-through which was negative so she was ruled out of small bowel obstruction.  Surgery has seen her and cleared her to advance her diet.  She is tolerating full liquid.  Advancing to soft diet.  Possible acute cholecystitis with cholelithiasis: Ultrasound abdomen shows possibility of acute cholecystitis.  Surgery does not seem to think she has cholecystitis.  Understandably, she is afebrile with no leukocytosis and she is no more tender on my examination as well so we will forego any HIDA scan but I will continue her antibiotics as long as she is here so we can complete the course.   Acute metabolic encephalopathy/delirium/agitation: Doing much better.  Continue with no morning labs and Seroquel at night and keeping the room bright and lit during the day.  Chronic kidney disease stage III: At baseline.  Continue to watch.  Hypertension: Blood  pressure much better.  Continue carvedilol, amlodipine and hydralazine.  Continue PRN hydralazine as well.  Type 2 diabetes mellitus: Controlled.  Continue SSI.  CAD status post CABG: Continue aspirin.  DVT prophylaxis: Lovenox and SCD Code Status: Full code Family Communication: Called patient's husband Mr. Raiford NobleRonald Marrin again today and updated him and answered all his questions. Disposition Plan: To be determined pending clinical improvement   Time spent: 29 minutes.   Sonya Clossavi Marolyn Urschel, MD [facility] Pager [phone]  If 7PM-7AM, please contact night-coverage [email or web address] Password [email or web address] 01/03/2019, 1:18 PM

## 2019-01-04 ENCOUNTER — Inpatient Hospital Stay (HOSPITAL_COMMUNITY): Payer: Medicare Other

## 2019-01-04 LAB — CBC WITH DIFFERENTIAL/PLATELET
Abs Immature Granulocytes: 0.19 10*3/uL — ABNORMAL HIGH (ref 0.00–0.07)
Basophils Absolute: 0.1 10*3/uL (ref 0.0–0.1)
Basophils Relative: 1 %
Eosinophils Absolute: 0 10*3/uL (ref 0.0–0.5)
Eosinophils Relative: 0 %
HCT: 36.6 % (ref 36.0–46.0)
Hemoglobin: 10.9 g/dL — ABNORMAL LOW (ref 12.0–15.0)
Immature Granulocytes: 2 %
Lymphocytes Relative: 19 %
Lymphs Abs: 2 10*3/uL (ref 0.7–4.0)
MCH: 25.6 pg — ABNORMAL LOW (ref 26.0–34.0)
MCHC: 29.8 g/dL — ABNORMAL LOW (ref 30.0–36.0)
MCV: 85.9 fL (ref 80.0–100.0)
Monocytes Absolute: 1.1 10*3/uL — ABNORMAL HIGH (ref 0.1–1.0)
Monocytes Relative: 10 %
Neutro Abs: 7 10*3/uL (ref 1.7–7.7)
Neutrophils Relative %: 68 %
Platelets: 169 10*3/uL (ref 150–400)
RBC: 4.26 MIL/uL (ref 3.87–5.11)
RDW: 15.9 % — ABNORMAL HIGH (ref 11.5–15.5)
WBC: 10.3 10*3/uL (ref 4.0–10.5)
nRBC: 0 % (ref 0.0–0.2)

## 2019-01-04 LAB — COMPREHENSIVE METABOLIC PANEL
ALT: 17 U/L (ref 0–44)
AST: 28 U/L (ref 15–41)
Albumin: 2.8 g/dL — ABNORMAL LOW (ref 3.5–5.0)
Alkaline Phosphatase: 66 U/L (ref 38–126)
Anion gap: 10 (ref 5–15)
BUN: 21 mg/dL (ref 8–23)
CO2: 18 mmol/L — ABNORMAL LOW (ref 22–32)
Calcium: 8 mg/dL — ABNORMAL LOW (ref 8.9–10.3)
Chloride: 115 mmol/L — ABNORMAL HIGH (ref 98–111)
Creatinine, Ser: 1.61 mg/dL — ABNORMAL HIGH (ref 0.44–1.00)
GFR calc Af Amer: 32 mL/min — ABNORMAL LOW (ref >60)
GFR calc non Af Amer: 28 mL/min — ABNORMAL LOW (ref >60)
Glucose, Bld: 164 mg/dL — ABNORMAL HIGH (ref 70–99)
Potassium: 4.3 mmol/L (ref 3.5–5.1)
Sodium: 143 mmol/L (ref 135–145)
Total Bilirubin: 0.6 mg/dL (ref 0.3–1.2)
Total Protein: 5.9 g/dL — ABNORMAL LOW (ref 6.5–8.1)

## 2019-01-04 LAB — GLUCOSE, CAPILLARY
Glucose-Capillary: 118 mg/dL — ABNORMAL HIGH (ref 70–99)
Glucose-Capillary: 133 mg/dL — ABNORMAL HIGH (ref 70–99)
Glucose-Capillary: 134 mg/dL — ABNORMAL HIGH (ref 70–99)
Glucose-Capillary: 135 mg/dL — ABNORMAL HIGH (ref 70–99)
Glucose-Capillary: 206 mg/dL — ABNORMAL HIGH (ref 70–99)
Glucose-Capillary: 209 mg/dL — ABNORMAL HIGH (ref 70–99)

## 2019-01-04 LAB — MAGNESIUM: Magnesium: 1.5 mg/dL — ABNORMAL LOW (ref 1.7–2.4)

## 2019-01-04 MED ORDER — PIPERACILLIN-TAZOBACTAM IN DEX 2-0.25 GM/50ML IV SOLN
2.2500 g | Freq: Three times a day (TID) | INTRAVENOUS | Status: DC
  Administered 2019-01-04 – 2019-01-05 (×2): 2.25 g via INTRAVENOUS
  Filled 2019-01-04 (×4): qty 50

## 2019-01-04 NOTE — Progress Notes (Signed)
PROGRESS NOTE    [place] M Meyering  WUJ:[id number] DOB: 03-08-1926 DOA: 12/29/2018 PCP: Alysia Penna, MD   Brief Narrative:  Sonya Chavez is a 85 year old pleasant female with a history of hypertension hyperlipidemia, type 2 diabetes mellitus, CAD status post CABG and history of hysterectomy and prior small bowel obstruction in March 2020 presented to ED on 12/29/2018 with a complaint of nausea, vomiting which started early a.m.  She did not have any abdominal pain, diarrhea, chills, fever, hematuria, dysuria or any other complaint.  Further work-up in the emergency department included CT of the abdomen and pelvis which revealed small bowel obstruction in left lower quadrant and possible additions.  Thick-walled gallbladder with large gallstone and possible acute cholecystitis.  She was subsequently admitted to hospital service and general surgery was consulted.  Ultrasound abdomen was done which again shows possible suspicion of acute cholecystitis.  She happens to be on Zosyn.  She started confusion and had delirium for at least 3 days and today for the first time, she is completely alert and oriented x3..  Afebrile with no leukocytosis for past 4 to 5 days.  Patient was supposed to have HIDA scan today but she refused.  She is refusing the labs and all medications as well.  Consultants:   General surgery  Procedures:   None  Antimicrobials:   Zosyn starting 12/31/2018   Subjective: Patient seen and examined.  When I entered the room, she was talking to her husband.  Nurse was in the room as well.  Per nurse, patient went down for HIDA scan but did not let them do this.  Patient is slightly angry at something while she is talking to the husband.  I talked with her husband as well right in front of her who agreed with proceeding with HIDA scan but patient was adamant on refusing it.  Husband later on call her son who called her as well but according to the nurses, she continues  to refuse to go for HIDA scan and also refusing medications and labs.  Patient was completely alert and oriented.  Objective: Vitals:   01/03/19 1349 01/03/19 1350 01/03/19 2024 01/04/19 0431  BP: 127/70  133/74 113/71  Pulse: (!) 58  82 78  Resp: Temp:  98.8 F (37.1 C) 98.6 F (37 C) 98.5 F (36.9 C)  TempSrc:  Oral Oral Oral  SpO2: 97%  98% 98%  Weight:      Height:        Intake/Output Summary (Last 24 hours) at 01/04/2019 1306 Last data filed at 01/04/2019 0700 Gross per 24 hour  Intake 1144.48 ml  Output -  Net 1144.48 ml   Filed Weights   01/01/19 0500 01/02/19 0449 01/03/19 0452  Weight: 59.5 kg 57.3 kg 59.6 kg    Examination:  General exam: Appears calm and comfortable  Respiratory system: Clear to auscultation. Respiratory effort normal. Cardiovascular system: S1 & S2 heard, RRR. No JVD, murmurs, rubs, gallops or clicks. No pedal edema. Gastrointestinal system: Abdomen is nondistended, soft and nontender. No organomegaly or masses felt. Normal bowel sounds heard. Central nervous system: Alert and oriented. No focal neurological deficits. Extremities: Symmetric 5 x 5 power. Skin: No rashes, lesions or ulcers Psychiatry: Judgement and insight appear normal. Mood & affect appropriate.  Data Reviewed: I have personally reviewed following labs and imaging studies  CBC: Recent Labs  Lab 12/31/18 1010 01/01/19 0636 01/02/19 1008 01/03/19 0512 01/04/19 1240  WBC 9.7 7.4 10.8*  12.3* 10.3  NEUTROABS 7.5 5.2 8.9* 8.5* 7.0  HGB 10.6* 10.0* 10.9* 11.3* 10.9*  HCT 35.2* 33.8* 36.1 37.2 36.6  MCV 89.3 89.2 87.2 85.7 85.9  PLT 167 156 185 190 169   Basic Metabolic Panel: Recent Labs  Lab 12/30/18 0557 12/31/18 1010 01/01/19 0636 01/02/19 1008 01/03/19 0512  NA 143 146* 149* 146* 145  K 3.7 3.4* 2.8* 2.5* 3.0*  CL 108 109 113* 112* 112*  CO2 26 24 23 24 24   GLUCOSE 145* 140* 150* 267* 116*  BUN 34* 32* 33* 22 19  CREATININE 1.31* 1.14* 1.19*  1.17* 1.24*  CALCIUM 8.8* 8.6* 8.6* 8.5* 8.5*  MG  --  1.9  --  1.7  --    GFR: Estimated Creatinine Clearance: 23.4 mL/min (A) (by C-G formula based on SCr of 1.24 mg/dL (H)). Liver Function Tests: Recent Labs  Lab 12/30/18 0557 12/31/18 1010 01/01/19 0636 01/02/19 1008 01/03/19 0512  AST 13* 23 27 27 27   ALT 10 14 15 17 16   ALKPHOS 74 81 75 75 67  BILITOT 0.7 0.6 1.3* 0.8 0.4  PROT 6.4* 6.6 6.6 6.3* 5.9*  ALBUMIN 3.4* 3.5 3.5 3.1* 2.9*   Recent Labs  Lab 12/29/18 1519 12/31/18 1010  LIPASE 26 18   No results for input(s): AMMONIA in the last 168 hours. Coagulation Profile: No results for input(s): INR, PROTIME in the last 168 hours. Cardiac Enzymes: No results for input(s): CKTOTAL, CKMB, CKMBINDEX, TROPONINI in the last 168 hours. BNP (last 3 results) No results for input(s): PROBNP in the last 8760 hours. HbA1C: No results for input(s): HGBA1C in the last 72 hours. CBG: Recent Labs  Lab 01/03/19 2019 01/04/19 0020 01/04/19 0426 01/04/19 0752 01/04/19 1147  GLUCAP 103* 135* 133* 118* 134*   Lipid Profile: No results for input(s): CHOL, HDL, LDLCALC, TRIG, CHOLHDL, LDLDIRECT in the last 72 hours. Thyroid Function Tests: No results for input(s): TSH, T4TOTAL, FREET4, T3FREE, THYROIDAB in the last 72 hours. Anemia Panel: No results for input(s): VITAMINB12, FOLATE, FERRITIN, TIBC, IRON, RETICCTPCT in the last 72 hours. Sepsis Labs: Recent Labs  Lab 01/02/19 1008  PROCALCITON 0.13    Recent Results (from the past 240 hour(s))  SARS Coronavirus 2 (CEPHEID - Performed in [facility] hospital lab), Hosp Order     Status: None   Collection Time: 12/29/18  6:14 PM  Result Value Ref Range Status   SARS Coronavirus 2 NEGATIVE NEGATIVE Final    Comment: (NOTE) If result is NEGATIVE SARS-CoV-2 target nucleic acids are NOT DETECTED. The SARS-CoV-2 RNA is generally detectable in upper and lower  respiratory specimens during the acute phase of infection. The  lowest  concentration of SARS-CoV-2 viral copies this assay can detect is 250  copies / mL. A negative result does not preclude SARS-CoV-2 infection  and should not be used as the sole basis for treatment or other  patient management decisions.  A negative result may occur with  improper specimen collection / handling, submission of specimen other  than nasopharyngeal swab, presence of viral mutation(s) within the  areas targeted by this assay, and inadequate number of viral copies  (<250 copies / mL). A negative result must be combined with clinical  observations, patient history, and epidemiological information. If result is POSITIVE SARS-CoV-2 target nucleic acids are DETECTED. The SARS-CoV-2 RNA is generally detectable in upper and lower  respiratory specimens dur ing the acute phase of infection.  Positive  results are indicative of active infection with  SARS-CoV-2.  Clinical  correlation with patient history and other diagnostic information is  necessary to determine patient infection status.  Positive results do  not rule out bacterial infection or co-infection with other viruses. If result is PRESUMPTIVE POSTIVE SARS-CoV-2 nucleic acids MAY BE PRESENT.   A presumptive positive result was obtained on the submitted specimen  and confirmed on repeat testing.  While 2019 novel coronavirus  (SARS-CoV-2) nucleic acids may be present in the submitted sample  additional confirmatory testing may be necessary for epidemiological  and / or clinical management purposes  to differentiate between  SARS-CoV-2 and other Sarbecovirus currently known to infect humans.  If clinically indicated additional testing with an alternate test  methodology [phone]) is advised. The SARS-CoV-2 RNA is generally  detectable in upper and lower respiratory sp ecimens during the acute  phase of infection. The expected result is Negative. Fact Sheet for Patients:  BoilerBrush.com.cy  Fact Sheet for Healthcare Providers: https://pope.com/ This test is not yet approved or cleared by the [place] FDA and has been authorized for detection and/or diagnosis of SARS-CoV-2 by FDA under an Emergency Use Authorization (EUA).  This EUA will remain in effect (meaning this test can be used) for the duration of the COVID-19 declaration under Section 564(b)(1) of the Act, 21 U.S.C. section 360bbb-3(b)(1), unless the authorization is terminated or revoked sooner. Performed at [facility], [address] [place], [place] [address]       Radiology Studies: No results found.  Scheduled Meds: . amLODipine  10 mg Oral QHS  . aspirin EC  81 mg Oral QHS  . carvedilol  12.5 mg Oral BID  . chlorthalidone  25 mg Oral Daily  . hydrALAZINE  5 mg Intravenous TID  . hydrALAZINE  25 mg Oral TID  . insulin aspart  0-9 Units Subcutaneous Q4H  . losartan  100 mg Oral Daily  . potassium chloride  40 mEq Oral BID  . QUEtiapine  25 mg Oral QHS   Continuous Infusions: . sodium chloride Stopped (01/04/19 0545)  . piperacillin-tazobactam (ZOSYN)  IV 3.375 g (01/04/19 0545)     LOS: 6 days   Assessment & Plan:   Principal Problem:   SBO (small bowel obstruction) (HCC) Active Problems:   HTN (hypertension)   DM (diabetes mellitus) (HCC)   CAD (coronary artery disease)   Delirium due to another medical condition   AKI (acute kidney injury) (HCC)   Cholelithiasis  Small bowel obstruction: Underwent small bowel follow-through which was negative so she was ruled out of small bowel obstruction.  Surgery has seen her and cleared her to advance her diet.  She is tolerating full liquid.  Advancing to soft diet.  Possible acute cholecystitis with cholelithiasis: Ultrasound abdomen shows possibility of acute cholecystitis.  Surgery does not seem to think she has cholecystitis.  Understandably, she is afebrile with no leukocytosis.  She was tender  on my examination up until day before yesterday but she has been nontender since yesterday and today.  We tried to convince her to get HIDA scan but she is refusing.  We will continue antibiotics for now as long as she is in the hospital.   Acute metabolic encephalopathy/delirium/agitation: Resolved.  Continue Seroquel at night and avoiding disturbance at night.  Chronic kidney disease stage III: At baseline.  Continue to watch.  Hypertension: Blood pressure controlled.  Continue carvedilol, amlodipine and hydralazine.  Continue PRN hydralazine as well.  Type 2 diabetes mellitus: Controlled.  Continue SSI.  CAD status post CABG: Continue aspirin.  DVT prophylaxis: Lovenox and SCD Code Status: Full code Family Communication: Discussed with patient and her husband over the phone. Disposition Plan: To be determined pending clinical improvement, PT was consulted yesterday but was not able to see her.  I have reconsulted PT today.  She might qualify for SNF.   Time spent: 40 minutes   Sonya Clossavi Reiss Mowrey, MD [facility] Pager [phone]  If 7PM-7AM, please contact night-coverage [email or web address] Password [email or web address] 01/04/2019, 1:06 PM

## 2019-01-04 NOTE — Progress Notes (Signed)
Patient refusing all her morning medications, Dr Jacqulyn Bath notified. Will keep monitoring

## 2019-01-04 NOTE — Evaluation (Signed)
Physical Therapy Evaluation Patient Details Name: Sonya Chavez MRN: [id number] DOB: 1926-03-27 Today's Date: 01/04/2019   History of Present Illness  85 yo female admitted with SBO, cholelithiasis. Hx of AMS, MI, DM, CABG, CAD  Clinical Impression  On eval, pt required Min guard-Min assist for mobility. She walked ~100 feet with use of a RW for stability, balance. Pt presents with general weakness, and impaired gait /balance. Pt is displeased with her stay. She exhibits some mild short term memory deficits and some distrust regarding some of the staff. She participated well during this PT session. Pt could return home as long as she has sufficient care in home. If husband is unable to provide current level of care, may need to consider SNF placement.     Follow Up Recommendations Home health PT;Supervision/Assistance - 24 hour(as long as pt will have sufficient supervision/assist from family)    Equipment Recommendations  None recommended by PT    Recommendations for Other Services       Precautions / Restrictions Precautions Precautions: Fall Restrictions Weight Bearing Restrictions: No      Mobility  Bed Mobility Overal bed mobility: Needs Assistance Bed Mobility: Supine to Sit     Supine to sit: Min guard;HOB elevated     General bed mobility comments: Assist for trunk and to scoot to EOB. Increased time. Cues for technique, safety.   Transfers Overall transfer level: Needs assistance Equipment used: Rolling walker (2 wheeled) Transfers: Sit to/from Stand Sit to Stand: Min guard         General transfer comment: close guard for safety. VCs safety, hand placement.   Ambulation/Gait Ambulation/Gait assistance: Min guard;Min assist Gait Distance (Feet): 100 Feet Assistive device: Rolling walker (2 wheeled) Gait Pattern/deviations: Step-through pattern;Decreased stride length     General Gait Details: Mostly close guard for safety. Intermittent assist to  manage RW safely. Pt tolerated distance well.   Stairs            Wheelchair Mobility    Modified Rankin (Stroke Patients Only)       Balance Overall balance assessment: Needs assistance         Standing balance support: Bilateral upper extremity supported Standing balance-Leahy Scale: Poor                               Pertinent Vitals/Pain Pain Assessment: No/denies pain    Home Living Family/patient expects to be discharged to:: Private residence Living Arrangements: Spouse/significant other Available Help at Discharge: Family;Available 24 hours/day Type of Home: House Home Access: Stairs to enter Entrance Stairs-Rails: Doctor, general practiceight;Left Entrance Stairs-Number of Steps: 3 Home Layout: One level Home Equipment: Environmental consultantWalker - 2 wheels      Prior Function Level of Independence: Independent               Hand Dominance        Extremity/Trunk Assessment   Upper Extremity Assessment Upper Extremity Assessment: Generalized weakness    Lower Extremity Assessment Lower Extremity Assessment: Generalized weakness    Cervical / Trunk Assessment Cervical / Trunk Assessment: Kyphotic  Communication   Communication: No difficulties  Cognition Arousal/Alertness: Awake/alert Behavior During Therapy: WFL for tasks assessed/performed   Area of Impairment: Memory                     Memory: Decreased short-term memory  General Comments      Exercises     Assessment/Plan    PT Assessment Patient needs continued PT services  PT Problem List Decreased strength;Decreased balance;Decreased mobility;Decreased activity tolerance;Decreased cognition;Decreased knowledge of use of DME       PT Treatment Interventions DME instruction;Gait training;Therapeutic activities;Functional mobility training;Balance training;Patient/family education;Therapeutic exercise    PT Goals (Current goals can be found in the Care Plan section)   Acute Rehab PT Goals Patient Stated Goal: home PT Goal Formulation: With patient Time For Goal Achievement: 01/18/19 Potential to Achieve Goals: Good    Frequency Min 3X/week   Barriers to discharge        Co-evaluation               AM-PAC PT "6 Clicks" Mobility  Outcome Measure Help needed turning from your back to your side while in a flat bed without using bedrails?: A Little Help needed moving from lying on your back to sitting on the side of a flat bed without using bedrails?: A Little Help needed moving to and from a bed to a chair (including a wheelchair)?: A Little Help needed standing up from a chair using your arms (e.g., wheelchair or bedside chair)?: A Little Help needed to walk in hospital room?: A Little Help needed climbing 3-5 steps with a railing? : A Little 6 Click Score: 18    End of Session Equipment Utilized During Treatment: Gait belt Activity Tolerance: Patient tolerated treatment well Patient left: in chair;with call bell/phone within reach;with chair alarm set   PT Visit Diagnosis: Unsteadiness on feet (R26.81);Muscle weakness (generalized) (M62.81);Difficulty in walking, not elsewhere classified (R26.2)    Time: 3846-6599 PT Time Calculation (min) (ACUTE ONLY): 39 min   Charges:   PT Evaluation $PT Eval Moderate Complexity: 1 Mod PT Treatments $Gait Training: 8-22 mins $Therapeutic Activity: 8-22 mins         Rebeca Alert, PT Acute Rehabilitation Services Pager: [phone] Office: [phone]

## 2019-01-04 NOTE — Care Management Important Message (Signed)
Important Message  Patient Details IM letter given to Marcelle Smiling RN to present to the Patient Name: Sonya Chavez MRN: [id number] Date of Birth: 1925-11-15   Medicare Important Message Given:  Yes    Caren Macadam 01/04/2019, 11:29 AM

## 2019-01-04 NOTE — Progress Notes (Signed)
PHARMACY NOTE -  ANTIBIOTIC RENAL DOSE ADJUSTMENT   Patient has been initiated on zosyn for Cholelithiasis/possible cholecystitis  SCr 1.61, estimated CrCl 18 ml/min  Plan: decrease zosyn to 2.25 gm IV q8h for CrCl < 20 ml/min  Herby Abraham, Pharm.D 01/04/2019 3:02 PM

## 2019-01-04 NOTE — Progress Notes (Signed)
    CC: Nausea and vomiting  Subjective: Patient is awake and talking this a.m.  Her only complaint about her stomach when she was hungry but she was tender when we palpated her right upper quadrant.  Her abdomen soft not distended     Objective: Vital signs in last 24 hours: Temp:  [98.5 F (36.9 C)-98.8 F (37.1 C)] 98.5 F (36.9 C) (06/01 0431) Pulse Rate:  [58-82] 78 (06/01 0431) Resp:  [16-20] 16 (06/01 0431) BP: (113-133)/(70-74) 113/71 (06/01 0431) SpO2:  [97 %-98 %] 98 % (06/01 0431) Last BM Date: 01/01/19 460 PO 700 IV Urine x 1 recorded Stool x 1 recorded Afebrile VSSNo labs today  WBC up over the weekend  HIDA pending  Intake/Output from previous day: 05/31 0701 - 06/01 0700 In: 1244.5 [P.O.:460; I.V.:674.1; IV Piggyback:110.3] Out: -  Intake/Output this shift: No intake/output data recorded.  General appearance: alert, cooperative and no distress GI: soft some tendernes RUQ this AM on palpation, not compliant prior.  Soft non distended abdomen  Lab Results:  Recent Labs    01/02/19 1008 01/03/19 0512  WBC 10.8* 12.3*  HGB 10.9* 11.3*  HCT 36.1 37.2  PLT 185 190    BMET Recent Labs    01/02/19 1008 01/03/19 0512  NA 146* 145  K 2.5* 3.0*  CL 112* 112*  CO2 24 24  GLUCOSE 267* 116*  BUN 22 19  CREATININE 1.17* 1.24*  CALCIUM 8.5* 8.5*   PT/INR No results for input(s): LABPROT, INR in the last 72 hours.  Recent Labs  Lab 12/30/18 0557 12/31/18 1010 01/01/19 0636 01/02/19 1008 01/03/19 0512  AST 13* 23 27 27 27   ALT 10 14 15 17 16   ALKPHOS 74 81 75 75 67  BILITOT 0.7 0.6 1.3* 0.8 0.4  PROT 6.4* 6.6 6.6 6.3* 5.9*  ALBUMIN 3.4* 3.5 3.5 3.1* 2.9*     Lipase     Component Value Date/Time   LIPASE 18 12/31/2018 1010     Medications: . amLODipine  10 mg Oral QHS  . aspirin EC  81 mg Oral QHS  . carvedilol  12.5 mg Oral BID  . chlorthalidone  25 mg Oral Daily  . hydrALAZINE  5 mg Intravenous TID  . hydrALAZINE  25 mg  Oral TID  . insulin aspart  0-9 Units Subcutaneous Q4H  . losartan  100 mg Oral Daily  . potassium chloride  40 mEq Oral BID  . QUEtiapine  25 mg Oral QHS   . sodium chloride Stopped (01/04/19 0545)  . piperacillin-tazobactam (ZOSYN)  IV 3.375 g (01/04/19 0545)   Assessment/Plan Acute metabolic encephalopathy - much improved Type 2 diabetes Hypertension Chronic kidney disease stage III CAD COVID negative Hypokalemia potassium 2.8  SBO  - SB protocol show contrast throughout the colon/No SBO currently  - advance diet as tolerated Cholelithiasis/possible cholecystitis  - WBC up some yesterday, LFT's remain normal, some tenderness RUQ this     AM  - HIDA pendings   FEN: IV fluids/n.p.o. TM:YTRZN 5/28 >> day 5 DVT: Lovenox  Plan:  Await HIDA       LOS: 6 days    Ivi Griffith 01/04/2019 [phone]

## 2019-01-05 LAB — COMPREHENSIVE METABOLIC PANEL
ALT: 18 U/L (ref 0–44)
AST: 22 U/L (ref 15–41)
Albumin: 2.8 g/dL — ABNORMAL LOW (ref 3.5–5.0)
Alkaline Phosphatase: 62 U/L (ref 38–126)
Anion gap: 8 (ref 5–15)
BUN: 22 mg/dL (ref 8–23)
CO2: 19 mmol/L — ABNORMAL LOW (ref 22–32)
Calcium: 8 mg/dL — ABNORMAL LOW (ref 8.9–10.3)
Chloride: 117 mmol/L — ABNORMAL HIGH (ref 98–111)
Creatinine, Ser: 1.65 mg/dL — ABNORMAL HIGH (ref 0.44–1.00)
GFR calc Af Amer: 31 mL/min — ABNORMAL LOW (ref >60)
GFR calc non Af Amer: 27 mL/min — ABNORMAL LOW (ref >60)
Glucose, Bld: 165 mg/dL — ABNORMAL HIGH (ref 70–99)
Potassium: 3.9 mmol/L (ref 3.5–5.1)
Sodium: 144 mmol/L (ref 135–145)
Total Bilirubin: 0.8 mg/dL (ref 0.3–1.2)
Total Protein: 5.5 g/dL — ABNORMAL LOW (ref 6.5–8.1)

## 2019-01-05 LAB — GLUCOSE, CAPILLARY
Glucose-Capillary: 115 mg/dL — ABNORMAL HIGH (ref 70–99)
Glucose-Capillary: 165 mg/dL — ABNORMAL HIGH (ref 70–99)
Glucose-Capillary: 188 mg/dL — ABNORMAL HIGH (ref 70–99)
Glucose-Capillary: 86 mg/dL (ref 70–99)

## 2019-01-05 MED ORDER — MAGNESIUM SULFATE 2 GM/50ML IV SOLN
2.0000 g | Freq: Once | INTRAVENOUS | Status: AC
  Administered 2019-01-05: 10:00:00 2 g via INTRAVENOUS
  Filled 2019-01-05: qty 50

## 2019-01-05 NOTE — Discharge Instructions (Signed)
Delirium  Delirium is a state of mental confusion. It comes on quickly and causes significant changes in a person's thinking and behavior. People with delirium usually have trouble paying attention to what is going on or knowing where they are. They may become very withdrawn or very emotional and unable to sit still. They may even see or feel things that are not there (hallucinations). Delirium is a sign of a serious underlying medical condition.  What are the causes?  Delirium occurs when something suddenly affects the signals that the brain sends out. Brain signals can be affected by anything that puts severe stress on the body and brain and causes brain chemicals to be out of balance. The most common causes of delirium include:  Infections. These may be bacterial, viral, fungal, or protozoal.  Medicines. These include many over-the-counter and prescription medicines.  Recreational drugs.  Substance withdrawal. This occurs with sudden discontinuation of alcohol, certain medicines, or recreational drugs.  Surgery.  Sudden vascular events, such as stroke, brain hemorrhage, and severe migraine.  Other brain disorders, such as tumors, seizures, and physical head trauma.  Metabolic disorders, such as kidney or liver failure.  Low blood oxygen (anoxia). This may occur with lung disease, cardiac arrest, or carbon monoxide poisoning.  Hormone imbalances (endocrinopathies), such as an overactive thyroid (hyperthyroidism) or underactive thyroid (hypothyroidism).  Vitamin deficiencies.  What increases the risk?  This condition is more likely to develop in:  Children.  Older people.  People who live alone.  People who have vision loss or hearing loss.  People who have existing brain disease, such as dementia.  People who have long-lasting (chronic) medical conditions, such as heart disease.  People who are hospitalized for long periods of time.  What are the signs or symptoms?  Delirium starts with a sudden change in a  person's thinking or behavior. Symptoms come and go (fluctuate) over time, and they are often worse at the end of the day. Symptoms include:  Not being able to stay awake (drowsiness) or pay attention.  Being confused about places, time, and people.  Forgetfulness.  Having extreme energy levels. These may be low or high.  Changes in sleep patterns.  Extreme mood swings, such as anger or anxiety.  Focusing on things or ideas that are not important.  Rambling and senseless talking.  Difficulty speaking, understanding speech, or both.  Hallucinations.  Tremor or unsteady gait.  How is this diagnosed?  People with delirium may not realize that they have the condition. Often, a family member or health care provider is the first person to notice the changes. The health care provider will obtain a detailed history of current symptoms, medical issues, medicines, and recreational drug use. The health care provider will perform a mental status examination by:  Asking questions to check for confusion.  Watching for abnormal behavior.  The health care provider may perform a physical exam and order lab tests or additional studies to determine the cause of the delirium.  How is this treated?  Treatment of delirium depends on the cause and severity. Delirium usually goes away within days or weeks of treating the underlying cause. In the meantime, the person should not be left alone because he or she may accidentally cause self-harm. Treatment includes supportive care, such as:  Increased light during the day and decreased light at night.  Low noise level.  Uninterrupted sleep.  A regular daily schedule.  Clocks and calendars to help with orientation.  Familiar objects, including   the person's pictures and clothing.  Frequent visits from familiar family and friends.  Healthy diet.  Exercise.  In more severe cases of delirium, medicine may be prescribed to help the person to keep calm and think more clearly.  Follow these  instructions at home:  Any supportive care should be continued as told by the health care provider.  All medicines should be used as told by the health care provider. This is important.  The health care provider should be consulted before over-the-counter medicines, herbs, or supplements are used.  All follow-up visits should be kept as told by the health care provider. This is important.  Alcohol and recreational drugs should be avoided as told by the health care provider.  Contact a health care provider if:  Symptoms do not get better or they become worse.  New symptoms of delirium develop.  Caring for the person at home does not seem safe.  Eating, drinking, or communicating stops.  There are side effects of medicines, such as changes in sleep patterns, dizziness, weight gain, restlessness, movement changes, or tremors.  Get help right away if:  Serious thoughts occur about self-harm or about hurting others.  There are serious side effects of medicine, such as:  Swelling of the face, lips, tongue, or throat.  Fever, confusion, muscle spasms, or seizures.  This information is not intended to replace advice given to you by your health care provider. Make sure you discuss any questions you have with your health care provider.  Document Released: 04/15/2012 Document Revised: 12/28/2015 Document Reviewed: 09/14/2014  Elsevier Interactive Patient Education  2019 [employer]

## 2019-01-05 NOTE — Progress Notes (Signed)
CC:  Nausea and vomiting  Subjective: She sitting up in bed and pretty much with it this morning.  She knows she is in the hospital she thinks it is common but she is not really sure.  She just kind of ignores the other questions she does not want to answer.  She says she had chicken and rice and fresh fruit for supper last evening.  She is not aware of any nausea or discomfort with eating.  On exam this morning she said she could feel me pushing but she did not have any discomfort with palpation of her abdomen.  She refused her HIDA scan and family could not talk her into it.  Objective: Vital signs in last 24 hours: Temp:  [97.5 F (36.4 C)-98.7 F (37.1 C)] 97.5 F (36.4 C) (06/02 0420) Pulse Rate:  [71-83] 82 (06/02 0420) Resp:  [14-17] 17 (06/02 0420) BP: (110-143)/(59-66) 110/59 (06/02 0420) SpO2:  [97 %-99 %] 97 % (06/02 0420) Weight:  [64.3 kg] 64.3 kg (06/02 0517) Last BM Date: 01/04/19  Intake/Output from previous day: 06/01 0701 - 06/02 0700 In: 2250.1 [P.O.:480; I.V.:1720.1; IV Piggyback:50] Out: 710 [Urine:710] Intake/Output this shift: No intake/output data recorded.  General appearance: alert, cooperative and no distress GI: soft, non-tender; bowel sounds normal; no masses,  no organomegaly  Lab Results:  Recent Labs    01/03/19 0512 01/04/19 1240  WBC 12.3* 10.3  HGB 11.3* 10.9*  HCT 37.2 36.6  PLT 190 169    BMET Recent Labs    01/03/19 0512 01/04/19 1240  NA 145 143  K 3.0* 4.3  CL 112* 115*  CO2 24 18*  GLUCOSE 116* 164*  BUN 19 21  CREATININE 1.24* 1.61*  CALCIUM 8.5* 8.0*   PT/INR No results for input(s): LABPROT, INR in the last 72 hours.  Recent Labs  Lab 12/31/18 1010 01/01/19 0636 01/02/19 1008 01/03/19 0512 01/04/19 1240  AST 23 27 27 27 28   ALT 14 15 17 16 17   ALKPHOS 81 75 75 67 66  BILITOT 0.6 1.3* 0.8 0.4 0.6  PROT 6.6 6.6 6.3* 5.9* 5.9*  ALBUMIN 3.5 3.5 3.1* 2.9* 2.8*     Lipase     Component Value  Date/Time   LIPASE 18 12/31/2018 1010     Medications: . amLODipine  10 mg Oral QHS  . aspirin EC  81 mg Oral QHS  . carvedilol  12.5 mg Oral BID  . chlorthalidone  25 mg Oral Daily  . hydrALAZINE  5 mg Intravenous TID  . hydrALAZINE  25 mg Oral TID  . insulin aspart  0-9 Units Subcutaneous Q4H  . losartan  100 mg Oral Daily  . potassium chloride  40 mEq Oral BID  . QUEtiapine  25 mg Oral QHS    Assessment/Plan Acute metabolic encephalopathy - much improved Type 2 diabetes Hypertension Chronic kidney disease stage III CAD COVID negative Hypokalemia potassium 2.8  SBO - SB protocol show contrast throughout the colon/No SBO currently - advance diet as tolerated Cholelithiasis/possible cholecystitis - WBC up some yesterday, LFT's remain normal, some tenderness RUQ this     AM  - HIDA refused   FEN: IV fluids/n.p.o. BJ:YNWGNd:Zosyn 5/28 >> day 5 DVT: Lovenox   Plan: Labs yesterday show normal white count LFTs are normal.  Currently no surgical indication for cholecystectomy currently.  Agree with giving her regular diet and see how she does.  Please call if anything changes, or we can be of further assistance.  LOS: 7 days    Xaviera Flaten 01/05/2019 [phone]

## 2019-01-05 NOTE — TOC Progression Note (Signed)
Transition of Care [facility]) - Progression Note    Patient Details  Name: Sonya Chavez MRN: [id number] Date of Birth: [birth year]  [facility]) CM/SW Contact  Golda Acre, RN Phone Number: 01/05/2019, 8:59 AM  Clinical Narrative:    hhc through [facility]   Expected Discharge Plan: Home w Home Health Services Barriers to Discharge: No Barriers Identified  Expected Discharge Plan and Services Expected Discharge Plan: Home w Home Health Services   Discharge Planning Services: CM Consult Post Acute Care Choice: Home Health Living arrangements for the past 2 months: Single Family Home                           HH Arranged: RN, PT [facility] Agency: [facility] Date HH Agency Contacted: 01/05/19 Time HH Agency Contacted: [phone] Representative spoke with at [facility] Agency: karen   Social Determinants of Health (SDOH) Interventions    Readmission Risk Interventions No flowsheet data found.

## 2019-01-05 NOTE — Discharge Summary (Signed)
Physician Discharge Summary  [place] M Woolford UTM:[id number] DOB: 1926-07-10 DOA: 12/29/2018  PCP: Alysia Penna, MD  Admit date: 12/29/2018 Discharge date: 01/05/2019  Admitted From: Home Disposition: Home  Recommendations for Outpatient Follow-up:  1. Follow up with PCP in 1-2 weeks 2. Please obtain BMP/CBC in one week 3. Please follow up on the following pending results:  Home Health: Yes Equipment/Devices: None  Discharge Condition: Stable CODE STATUS: Full code Diet recommendation: Regular  Subjective: Seen and examined.  She has no complaints.  She is alert and oriented.  She wants to go home.  Brief/Interim Summary: Sonya Chavez is a 85 year old pleasant female with a history of hypertension hyperlipidemia, type 2 diabetes mellitus, CAD status post CABG and history of hysterectomy and prior small bowel obstruction in March 2020 presented to ED on 12/29/2018 with a complaint of nausea, vomiting which started early a.m.  She did not have any abdominal pain, diarrhea, chills, fever, hematuria, dysuria or any other complaint.  Further work-up in the emergency department included CT of the abdomen and pelvis which revealed small bowel obstruction in left lower quadrant and possible additions.  Thick-walled gallbladder with large gallstone and possible acute cholecystitis.  She was subsequently admitted to hospital service and general surgery was consulted.  Ultrasound abdomen was done which again shows possible suspicion of acute cholecystitis.  She was continued on Zosyn.  She then started having delirium which had since resolved with nightly Seroquel and other modifications.  Per surgery, she did not seem to have signs or symptoms of acute cholecystitis and she was not a surgical candidate but despite of that, I personally wanted to do HIDA scan her which could not be done on the weekend and when Monday came, patient became alert and oriented and daily she refused to do the  test.  I did talk to her husband who was in agreement to do the test and try to convince her however she remained very rigid and adamant on not doing it.  She has not had any abdominal pain and no abdominal tenderness since past 3 days now and she has received 7 days of IV Zosyn anyways.  She was seen by PT OT and they recommended home health which has been arranged for her right now that patient is hemodynamically stable with no electrolyte abnormalities and her renal function at her baseline so she is going to be discharged home.  Patient is in agreement.  I also called her husband Sonya Chavez over the phone who is also in agreement.  Of note, she was not given her antihypertensives here and her blood pressure remained stable.  She seems to be on several medications.  I am taking the liberty to discontinue chlorthalidone and losartan but resume carvedilol, amlodipine and hydralazine.  I am also discontinuing her metformin.  When seen by PCP, this medication should be reviewed and resumed back if indicated.  Discharge Diagnoses:  Principal Problem:   SBO (small bowel obstruction) (HCC) Active Problems:   HTN (hypertension)   DM (diabetes mellitus) (HCC)   CAD (coronary artery disease)   Delirium due to another medical condition   CKD (chronic kidney disease), stage III [facility])   Cholelithiasis    Discharge Instructions  Discharge Instructions    Discharge patient   Complete by:  As directed    Discharge disposition:  06-Home-Health Care Svc   Discharge patient date:  01/05/2019     Allergies as of 01/05/2019      Reactions   Statins  Other (See Comments)   myalgia myalgias myalgias Myalgias   Crestor [rosuvastatin] Other (See Comments)   Myalgias   Lipitor [atorvastatin] Other (See Comments)   myalgias   Zocor [simvastatin] Other (See Comments)   myalgias      Medication List    STOP taking these medications   chlorthalidone 25 MG tablet Commonly known as:  HYGROTON   losartan 100  MG tablet Commonly known as:  COZAAR   metFORMIN 500 MG tablet Commonly known as:  GLUCOPHAGE     TAKE these medications   alendronate 70 MG tablet Commonly known as:  FOSAMAX Take 70 mg by mouth every Monday.   amLODipine 10 MG tablet Commonly known as:  NORVASC Take 10 mg by mouth at bedtime.   aspirin EC 81 MG tablet Take 81 mg by mouth at bedtime.   CALCIUM CITRATE + PO Take 1 tablet by mouth daily.   carvedilol 6.25 MG tablet Commonly known as:  COREG Take 12.5 mg by mouth 2 (two) times daily.   gabapentin 300 MG capsule Commonly known as:  NEURONTIN Take 300 mg by mouth at bedtime.   hydrALAZINE 25 MG tablet Commonly known as:  APRESOLINE Take 25 mg by mouth 3 (three) times daily.   lovastatin 40 MG tablet Commonly known as:  MEVACOR Take 40 mg by mouth every evening.      Follow-up Information    Alysia Penna, MD Follow up in 1 week(s).   Specialty:  Internal Medicine Contact information: [address] [place] [address] [phone]          Allergies  Allergen Reactions  . Statins Other (See Comments)    myalgia myalgias myalgias Myalgias   . Crestor [Rosuvastatin] Other (See Comments)    Myalgias  . Lipitor [Atorvastatin] Other (See Comments)    myalgias  . Zocor [Simvastatin] Other (See Comments)    myalgias    Consultations: General surgery   Procedures/Studies: Ct Abdomen Pelvis W Contrast  Result Date: 12/29/2018 CLINICAL DATA:  Reduced oral intake with nausea and vomiting since last night at about 4 a.m. EXAM: CT ABDOMEN AND PELVIS WITH CONTRAST TECHNIQUE: Multidetector CT imaging of the abdomen and pelvis was performed using the standard protocol following bolus administration of intravenous contrast. CONTRAST:  75mL OMNIPAQUE IOHEXOL 300 MG/ML  SOLN COMPARISON:  10/20/2017 and radiographs from 12/29/2018 FINDINGS: Lower chest: Distended and thick-walled esophagus. Possible paraesophageal edema. Atherosclerotic  calcification of the thoracic aorta and coronary arteries with mild cardiomegaly. Trace bilateral pleural effusions. Prominent pulmonary venous structures. Hepatobiliary: Notable gallbladder wall thickening with complex gallstone including some nitrogen gas phenomenon, the presumed gallstone measures about 8.0 by 3.7 cm. Stable mild intrahepatic and extrahepatic biliary dilatation. Pancreas: Mostly atrophic pancreas. Dorsal pancreatic duct dilatation in part of the pancreatic head. Spleen: 1.9 cm hypodense splenic lesion is technically nonspecific on image 20/2, and measured about 0.6 cm on 12/18/2016. Adrenals/Urinary Tract: Both adrenal glands appear normal. Small hypodense left renal lesions are statistically likely to be small cysts but technically nonspecific. Stomach/Bowel: Proximal small bowel obstruction in the left lower quadrant along what is likely an angulated loop of bowel with transition point shown about on image 48/5. Proximal to this the loops of small bowel measure up to 5.4 cm in diameter, all distal bowel is normal caliber. There is notable sigmoid colon diverticulosis without active diverticulitis. Postoperative findings in the right small bowel. Distended stomach. Vascular/Lymphatic: Aortoiliac atherosclerotic vascular disease. 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 Reproductive: Uterus absent.  Adnexa unremarkable. Other: Small amount of free fluid along the right paracolic gutter region. Low-level edema the omentum. Musculoskeletal: 7 mm of degenerative anterolisthesis at L4-5 with resulting bilateral foraminal impingement. IMPRESSION: 1. Small bowel obstruction in the left lower quadrant, with distended stomach and distended small bowel extending down to what appears to probably be an angulated loop of small bowel, query adhesion. There is a trace amount of free fluid especially in the right paracolic gutter. 2. Thick-walled gallbladder with chronic  very large gallstone measuring up to 8 cm in diameter. Acute cholecystitis is not excluded. 3. Distended and thick-walled esophagus. Although the distension may be from the distal obstruction, wall thickening suggests esophagitis. 4. Trace bilateral pleural effusions.  Distended pulmonary veins. 5.  Aortic Atherosclerosis (ICD10-I70.0).  Coronary atherosclerosis. 6. Mild chronic intrahepatic and extrahepatic biliary dilatation. Stable. Dilated dorsal pancreatic duct in the pancreatic head. 7. Nonspecific hypodense lesion in the spleen has enlarged compared to 12/18/2016. 8. Sigmoid colon diverticulosis. 9. 7 mm of degenerative anterolisthesis at L4-5 resulting in bilateral foraminal impingement at this level. Electronically Signed   By: Gaylyn Rong M.D.   On: 12/29/2018 18:08   US Abdomen Limited  Result Date: 12/30/2018 CLINICAL DATA:  Cholecystitis. EXAM: ULTRASOUND ABDOMEN LIMITED RIGHT UPPER QUADRANT COMPARISON:  None. FINDINGS: Gallbladder: There is diffuse gallbladder wall thickening measuring up to approximately 9 mm. The sonographic Murphy sign cannot be adequately assessed. The gallbladder is filled with gallstones as evidence by a wall echo shadow complex. Common bile duct: Diameter: 11.3 mm. Liver: The liver was suboptimally evaluated secondary to patient condition. There is some intrahepatic biliary ductal dilatation. Portal vein is patent on color Doppler imaging with normal direction of blood flow towards the liver. IMPRESSION: 1. Cholelithiasis with gallbladder wall thickening. Findings are suspicious for acute cholecystitis despite the inability to evaluate for a positive sonographic Murphy sign. If further evaluation is required, HIDA scan can be performed. 2. Dilated common bile duct with intrahepatic biliary ductal dilatation is concerning for an obstructing process such is choledocholithiasis. 3. Suboptimal evaluation of the liver secondary to poor sonographic windows. Electronically  Signed   By: Katherine Mantle M.D.   On: 12/30/2018 15:31   Dg Abdomen Acute W/chest  Result Date: 12/29/2018 CLINICAL DATA:  Nausea and vomiting.  Hypertension. EXAM: DG ABDOMEN ACUTE W/ 1V CHEST COMPARISON:  Chest radiograph Dec 28, 2017; abdomen radiograph October 22, 2017 FINDINGS: PA chest: No edema or consolidation. There is mild scarring the left apex. Heart size and pulmonary vascular normal. No adenopathy. There is aortic atherosclerosis. Patient is status post median sternotomy. Supine and upright abdomen: There is no bowel dilatation. There are scattered air-fluid levels. No free air. There are vascular calcifications in each common and external iliac artery. Postoperative changes noted in the right abdomen. IMPRESSION: Multiple air-fluid levels which may indicate enteritis or ileus. A degree of bowel obstruction is a consideration given this appearance. No bowel dilatation is noted, however with most loops of bowel fluid-filled. No free air. No edema or consolidation. Status post median sternotomy. Aortic Atherosclerosis (ICD10-I70.0). Electronically Signed   By: Bretta Bang III M.D.   On: 12/29/2018 16:49   Dg Abd Portable 1v-small Bowel Obstruction Protocol-initial, 8 Hr Delay  Result Date: 12/31/2018 CLINICAL DATA:  Possible small bowel obstruction. 8 hour film after oral contrast administration. EXAM: PORTABLE ABDOMEN - 1 VIEW COMPARISON:  Single-view of the abdomen 12/31/2018 at 1347 hours FINDINGS: Contrast material is now seen  throughout a decompressed colon. No small bowel dilatation is identified. NG tube remains in place. IMPRESSION: Negative for small bowel obstruction.  No acute finding. Electronically Signed   By: Drusilla Kannerhomas  Dalessio M.D.   On: 12/31/2018 19:51   Dg Abd Portable 1v-small Bowel Protocol-position Verification  Result Date: 12/31/2018 CLINICAL DATA:  NG tube placement EXAM: PORTABLE ABDOMEN - 1 VIEW COMPARISON:  CT dated 12/29/2018. FINDINGS: The NG tube tip  projects over the gastric antrum/pylorus. The tip is pointed distally. The bowel gas pattern is nonspecific and nonobstructive. Oral contrast is seen in several small bowel loops as well as the majority of the colon. IMPRESSION: NG tube tip projects over the gastric antrum/pylorus. Electronically Signed   By: Katherine Mantlehristopher  Green M.D.   On: 12/31/2018 14:10      Discharge Exam: Vitals:   01/05/19 0420 01/05/19 1003  BP: (!) 110/59 (!) 145/63  Pulse: 82   Resp: 17   Temp: (!) 97.5 F (36.4 C)   SpO2: 97%    Vitals:   01/04/19 2131 01/05/19 0420 01/05/19 0517 01/05/19 1003  BP: (!) 143/66 (!) 110/59  (!) 145/63  Pulse: 83 82    Resp: 17 17    Temp: 98.7 F (37.1 C) (!) 97.5 F (36.4 C)    TempSrc: Oral Oral    SpO2: 99% 97%    Weight:   64.3 kg   Height:        General: Pt is alert, awake, not in acute distress Cardiovascular: RRR, S1/S2 +, no rubs, no gallops Respiratory: CTA bilaterally, no wheezing, no rhonchi Abdominal: Soft, NT, ND, bowel sounds + Extremities: no edema, no cyanosis    The results of significant diagnostics from this hospitalization (including imaging, microbiology, ancillary and laboratory) are listed below for reference.     Microbiology: Recent Results (from the past 240 hour(s))  SARS Coronavirus 2 (CEPHEID - Performed in [facility] lab), Hosp Order     Status: None   Collection Time: 12/29/18  6:14 PM  Result Value Ref Range Status   SARS Coronavirus 2 NEGATIVE NEGATIVE Final    Comment: (NOTE) If result is NEGATIVE SARS-CoV-2 target nucleic acids are NOT DETECTED. The SARS-CoV-2 RNA is generally detectable in upper and lower  respiratory specimens during the acute phase of infection. The lowest  concentration of SARS-CoV-2 viral copies this assay can detect is 250  copies / mL. A negative result does not preclude SARS-CoV-2 infection  and should not be used as the sole basis for treatment or other  patient management decisions.  A  negative result may occur with  improper specimen collection / handling, submission of specimen other  than nasopharyngeal swab, presence of viral mutation(s) within the  areas targeted by this assay, and inadequate number of viral copies  (<250 copies / mL). A negative result must be combined with clinical  observations, patient history, and epidemiological information. If result is POSITIVE SARS-CoV-2 target nucleic acids are DETECTED. The SARS-CoV-2 RNA is generally detectable in upper and lower  respiratory specimens dur ing the acute phase of infection.  Positive  results are indicative of active infection with SARS-CoV-2.  Clinical  correlation with patient history and other diagnostic information is  necessary to determine patient infection status.  Positive results do  not rule out bacterial infection or co-infection with other viruses. If result is PRESUMPTIVE POSTIVE SARS-CoV-2 nucleic acids MAY BE PRESENT.   A presumptive positive result was obtained on the submitted specimen  and confirmed  on repeat testing.  While 2019 novel coronavirus  (SARS-CoV-2) nucleic acids may be present in the submitted sample  additional confirmatory testing may be necessary for epidemiological  and / or clinical management purposes  to differentiate between  SARS-CoV-2 and other Sarbecovirus currently known to infect humans.  If clinically indicated additional testing with an alternate test  methodology [phone]) is advised. The SARS-CoV-2 RNA is generally  detectable in upper and lower respiratory sp ecimens during the acute  phase of infection. The expected result is Negative. Fact Sheet for Patients:  BoilerBrush.com.cy Fact Sheet for Healthcare Providers: https://pope.com/ This test is not yet approved or cleared by the [place] FDA and has been authorized for detection and/or diagnosis of SARS-CoV-2 by FDA under an Emergency Use  Authorization (EUA).  This EUA will remain in effect (meaning this test can be used) for the duration of the COVID-19 declaration under Section 564(b)(1) of the Act, 21 U.S.C. section 360bbb-3(b)(1), unless the authorization is terminated or revoked sooner. Performed at [facility], [address], [place], [place] [address]      Labs: BNP (last 3 results) No results for input(s): BNP in the last 8760 hours. Basic Metabolic Panel: Recent Labs  Lab 12/31/18 1010 01/01/19 0636 01/02/19 1008 01/03/19 0512 01/04/19 1240 01/05/19 0928  NA 146* 149* 146* 145 143 144  K 3.4* 2.8* 2.5* 3.0* 4.3 3.9  CL 109 113* 112* 112* 115* 117*  CO2 18* 19*  GLUCOSE 140* 150* 267* 116* 164* 165*  BUN 32* 33* CREATININE 1.14* 1.19* 1.17* 1.24* 1.61* 1.65*  CALCIUM 8.6* 8.6* 8.5* 8.5* 8.0* 8.0*  MG 1.9  --  1.7  --  1.5*  --    Liver Function Tests: Recent Labs  Lab 01/01/19 0636 01/02/19 1008 01/03/19 0512 01/04/19 1240 01/05/19 0928  AST ALT ALKPHOS 75 75 67 66 62  BILITOT 1.3* 0.8 0.4 0.6 0.8  PROT 6.6 6.3* 5.9* 5.9* 5.5*  ALBUMIN 3.5 3.1* 2.9* 2.8* 2.8*   Recent Labs  Lab 12/29/18 1519 12/31/18 1010  LIPASE 26 18   No results for input(s): AMMONIA in the last 168 hours. CBC: Recent Labs  Lab 12/31/18 1010 01/01/19 0636 01/02/19 1008 01/03/19 0512 01/04/19 1240  WBC 9.7 7.4 10.8* 12.3* 10.3  NEUTROABS 7.5 5.2 8.9* 8.5* 7.0  HGB 10.6* 10.0* 10.9* 11.3* 10.9*  HCT 35.2* 33.8* 36.1 37.2 36.6  MCV 89.3 89.2 87.2 85.7 85.9  PLT 167 156 185 190 169   Cardiac Enzymes: No results for input(s): CKTOTAL, CKMB, CKMBINDEX, TROPONINI in the last 168 hours. BNP: Invalid input(s): POCBNP CBG: Recent Labs  Lab 01/04/19 1634 01/04/19 2127 01/05/19 0005 01/05/19 0416 01/05/19 0743  GLUCAP 206* 209* 165* 86 115*   D-Dimer No results for input(s): DDIMER in the last 72 hours. Hgb A1c No results  for input(s): HGBA1C in the last 72 hours. Lipid Profile No results for input(s): CHOL, HDL, LDLCALC, TRIG, CHOLHDL, LDLDIRECT in the last 72 hours. Thyroid function studies No results for input(s): TSH, T4TOTAL, T3FREE, THYROIDAB in the last 72 hours.  Invalid input(s): FREET3 Anemia work up No results for input(s): VITAMINB12, FOLATE, FERRITIN, TIBC, IRON, RETICCTPCT in the last 72 hours. Urinalysis    Component Value Date/Time   COLORURINE YELLOW 12/29/2018 1657   APPEARANCEUR CLEAR 12/29/2018 1657   LABSPEC 1.013 12/29/2018 1657   PHURINE 8.0 12/29/2018  1657   GLUCOSEU NEGATIVE 12/29/2018 1657   HGBUR NEGATIVE 12/29/2018 1657   BILIRUBINUR NEGATIVE 12/29/2018 1657   KETONESUR 5 (A) 12/29/2018 1657   PROTEINUR 100 (A) 12/29/2018 1657   UROBILINOGEN 0.2 06/03/2013 1938   NITRITE NEGATIVE 12/29/2018 1657   LEUKOCYTESUR NEGATIVE 12/29/2018 1657   Sepsis Labs Invalid input(s): PROCALCITONIN,  WBC,  LACTICIDVEN Microbiology Recent Results (from the past 240 hour(s))  SARS Coronavirus 2 (CEPHEID - Performed in [facility] hospital lab), Hosp Order     Status: None   Collection Time: 12/29/18  6:14 PM  Result Value Ref Range Status   SARS Coronavirus 2 NEGATIVE NEGATIVE Final    Comment: (NOTE) If result is NEGATIVE SARS-CoV-2 target nucleic acids are NOT DETECTED. The SARS-CoV-2 RNA is generally detectable in upper and lower  respiratory specimens during the acute phase of infection. The lowest  concentration of SARS-CoV-2 viral copies this assay can detect is 250  copies / mL. A negative result does not preclude SARS-CoV-2 infection  and should not be used as the sole basis for treatment or other  patient management decisions.  A negative result may occur with  improper specimen collection / handling, submission of specimen other  than nasopharyngeal swab, presence of viral mutation(s) within the  areas targeted by this assay, and inadequate number of viral copies  (<250  copies / mL). A negative result must be combined with clinical  observations, patient history, and epidemiological information. If result is POSITIVE SARS-CoV-2 target nucleic acids are DETECTED. The SARS-CoV-2 RNA is generally detectable in upper and lower  respiratory specimens dur ing the acute phase of infection.  Positive  results are indicative of active infection with SARS-CoV-2.  Clinical  correlation with patient history and other diagnostic information is  necessary to determine patient infection status.  Positive results do  not rule out bacterial infection or co-infection with other viruses. If result is PRESUMPTIVE POSTIVE SARS-CoV-2 nucleic acids MAY BE PRESENT.   A presumptive positive result was obtained on the submitted specimen  and confirmed on repeat testing.  While 2019 novel coronavirus  (SARS-CoV-2) nucleic acids may be present in the submitted sample  additional confirmatory testing may be necessary for epidemiological  and / or clinical management purposes  to differentiate between  SARS-CoV-2 and other Sarbecovirus currently known to infect humans.  If clinically indicated additional testing with an alternate test  methodology [phone]) is advised. The SARS-CoV-2 RNA is generally  detectable in upper and lower respiratory sp ecimens during the acute  phase of infection. The expected result is Negative. Fact Sheet for Patients:  BoilerBrush.com.cy Fact Sheet for Healthcare Providers: https://pope.com/ This test is not yet approved or cleared by the [place] FDA and has been authorized for detection and/or diagnosis of SARS-CoV-2 by FDA under an Emergency Use Authorization (EUA).  This EUA will remain in effect (meaning this test can be used) for the duration of the COVID-19 declaration under Section 564(b)(1) of the Act, 21 U.S.C. section 360bbb-3(b)(1), unless the authorization is terminated or revoked  sooner. Performed at [facility], [address], [place], [place] [address]      Time coordinating discharge: 45 minutes  SIGNED:   Hughie Closs, MD  [facility] 01/05/2019, 11:23 AM Pager [phone]  If 7PM-7AM, please contact night-coverage [email or web address] Password [email or web address]

## 2019-01-06 DIAGNOSIS — E1122 Type 2 diabetes mellitus with diabetic chronic kidney disease: Secondary | ICD-10-CM | POA: Diagnosis not present

## 2019-01-06 DIAGNOSIS — K56609 Unspecified intestinal obstruction, unspecified as to partial versus complete obstruction: Secondary | ICD-10-CM | POA: Diagnosis not present

## 2019-01-06 DIAGNOSIS — I2510 Atherosclerotic heart disease of native coronary artery without angina pectoris: Secondary | ICD-10-CM | POA: Diagnosis not present

## 2019-01-06 DIAGNOSIS — Z951 Presence of aortocoronary bypass graft: Secondary | ICD-10-CM | POA: Diagnosis not present

## 2019-01-06 DIAGNOSIS — N183 Chronic kidney disease, stage 3 (moderate): Secondary | ICD-10-CM | POA: Diagnosis not present

## 2019-01-06 DIAGNOSIS — Z7982 Long term (current) use of aspirin: Secondary | ICD-10-CM | POA: Diagnosis not present

## 2019-01-06 DIAGNOSIS — I129 Hypertensive chronic kidney disease with stage 1 through stage 4 chronic kidney disease, or unspecified chronic kidney disease: Secondary | ICD-10-CM | POA: Diagnosis not present

## 2019-01-06 DIAGNOSIS — K8020 Calculus of gallbladder without cholecystitis without obstruction: Secondary | ICD-10-CM | POA: Diagnosis not present

## 2019-01-07 DIAGNOSIS — I2510 Atherosclerotic heart disease of native coronary artery without angina pectoris: Secondary | ICD-10-CM | POA: Diagnosis not present

## 2019-01-07 DIAGNOSIS — K56609 Unspecified intestinal obstruction, unspecified as to partial versus complete obstruction: Secondary | ICD-10-CM | POA: Diagnosis not present

## 2019-01-07 DIAGNOSIS — N183 Chronic kidney disease, stage 3 (moderate): Secondary | ICD-10-CM | POA: Diagnosis not present

## 2019-01-07 DIAGNOSIS — I129 Hypertensive chronic kidney disease with stage 1 through stage 4 chronic kidney disease, or unspecified chronic kidney disease: Secondary | ICD-10-CM | POA: Diagnosis not present

## 2019-01-07 DIAGNOSIS — K8020 Calculus of gallbladder without cholecystitis without obstruction: Secondary | ICD-10-CM | POA: Diagnosis not present

## 2019-01-07 DIAGNOSIS — E1122 Type 2 diabetes mellitus with diabetic chronic kidney disease: Secondary | ICD-10-CM | POA: Diagnosis not present

## 2019-01-13 DIAGNOSIS — I130 Hypertensive heart and chronic kidney disease with heart failure and stage 1 through stage 4 chronic kidney disease, or unspecified chronic kidney disease: Secondary | ICD-10-CM | POA: Diagnosis not present

## 2019-01-13 DIAGNOSIS — I25118 Atherosclerotic heart disease of native coronary artery with other forms of angina pectoris: Secondary | ICD-10-CM | POA: Diagnosis not present

## 2019-01-13 DIAGNOSIS — Z8719 Personal history of other diseases of the digestive system: Secondary | ICD-10-CM | POA: Diagnosis not present

## 2019-01-13 DIAGNOSIS — E1140 Type 2 diabetes mellitus with diabetic neuropathy, unspecified: Secondary | ICD-10-CM | POA: Diagnosis not present

## 2019-01-13 DIAGNOSIS — M1990 Unspecified osteoarthritis, unspecified site: Secondary | ICD-10-CM | POA: Diagnosis not present

## 2019-01-13 DIAGNOSIS — E7849 Other hyperlipidemia: Secondary | ICD-10-CM | POA: Diagnosis not present

## 2019-01-13 DIAGNOSIS — D649 Anemia, unspecified: Secondary | ICD-10-CM | POA: Diagnosis not present

## 2019-01-13 DIAGNOSIS — I509 Heart failure, unspecified: Secondary | ICD-10-CM | POA: Diagnosis not present

## 2019-01-13 DIAGNOSIS — N183 Chronic kidney disease, stage 3 (moderate): Secondary | ICD-10-CM | POA: Diagnosis not present

## 2019-01-13 DIAGNOSIS — N179 Acute kidney failure, unspecified: Secondary | ICD-10-CM | POA: Diagnosis not present

## 2019-01-13 DIAGNOSIS — M810 Age-related osteoporosis without current pathological fracture: Secondary | ICD-10-CM | POA: Diagnosis not present

## 2019-01-13 DIAGNOSIS — K2970 Gastritis, unspecified, without bleeding: Secondary | ICD-10-CM | POA: Diagnosis not present

## 2019-01-13 DIAGNOSIS — E1169 Type 2 diabetes mellitus with other specified complication: Secondary | ICD-10-CM | POA: Diagnosis not present

## 2019-01-13 DIAGNOSIS — R7989 Other specified abnormal findings of blood chemistry: Secondary | ICD-10-CM | POA: Diagnosis not present

## 2019-01-14 DIAGNOSIS — K56609 Unspecified intestinal obstruction, unspecified as to partial versus complete obstruction: Secondary | ICD-10-CM | POA: Diagnosis not present

## 2019-01-14 DIAGNOSIS — E1122 Type 2 diabetes mellitus with diabetic chronic kidney disease: Secondary | ICD-10-CM | POA: Diagnosis not present

## 2019-01-14 DIAGNOSIS — N183 Chronic kidney disease, stage 3 (moderate): Secondary | ICD-10-CM | POA: Diagnosis not present

## 2019-01-14 DIAGNOSIS — K8020 Calculus of gallbladder without cholecystitis without obstruction: Secondary | ICD-10-CM | POA: Diagnosis not present

## 2019-01-14 DIAGNOSIS — I129 Hypertensive chronic kidney disease with stage 1 through stage 4 chronic kidney disease, or unspecified chronic kidney disease: Secondary | ICD-10-CM | POA: Diagnosis not present

## 2019-01-14 DIAGNOSIS — I2510 Atherosclerotic heart disease of native coronary artery without angina pectoris: Secondary | ICD-10-CM | POA: Diagnosis not present

## 2019-01-15 DIAGNOSIS — I130 Hypertensive heart and chronic kidney disease with heart failure and stage 1 through stage 4 chronic kidney disease, or unspecified chronic kidney disease: Secondary | ICD-10-CM | POA: Diagnosis not present

## 2019-01-15 DIAGNOSIS — R82998 Other abnormal findings in urine: Secondary | ICD-10-CM | POA: Diagnosis not present

## 2019-01-18 DIAGNOSIS — I130 Hypertensive heart and chronic kidney disease with heart failure and stage 1 through stage 4 chronic kidney disease, or unspecified chronic kidney disease: Secondary | ICD-10-CM | POA: Diagnosis not present

## 2019-01-18 DIAGNOSIS — D649 Anemia, unspecified: Secondary | ICD-10-CM | POA: Diagnosis not present

## 2019-01-19 DIAGNOSIS — I2510 Atherosclerotic heart disease of native coronary artery without angina pectoris: Secondary | ICD-10-CM | POA: Diagnosis not present

## 2019-01-19 DIAGNOSIS — N183 Chronic kidney disease, stage 3 (moderate): Secondary | ICD-10-CM | POA: Diagnosis not present

## 2019-01-19 DIAGNOSIS — I129 Hypertensive chronic kidney disease with stage 1 through stage 4 chronic kidney disease, or unspecified chronic kidney disease: Secondary | ICD-10-CM | POA: Diagnosis not present

## 2019-01-19 DIAGNOSIS — K56609 Unspecified intestinal obstruction, unspecified as to partial versus complete obstruction: Secondary | ICD-10-CM | POA: Diagnosis not present

## 2019-01-19 DIAGNOSIS — E1122 Type 2 diabetes mellitus with diabetic chronic kidney disease: Secondary | ICD-10-CM | POA: Diagnosis not present

## 2019-01-19 DIAGNOSIS — K8020 Calculus of gallbladder without cholecystitis without obstruction: Secondary | ICD-10-CM | POA: Diagnosis not present

## 2019-01-26 DIAGNOSIS — I129 Hypertensive chronic kidney disease with stage 1 through stage 4 chronic kidney disease, or unspecified chronic kidney disease: Secondary | ICD-10-CM | POA: Diagnosis not present

## 2019-01-26 DIAGNOSIS — I509 Heart failure, unspecified: Secondary | ICD-10-CM | POA: Diagnosis not present

## 2019-01-26 DIAGNOSIS — Z1331 Encounter for screening for depression: Secondary | ICD-10-CM | POA: Diagnosis not present

## 2019-01-26 DIAGNOSIS — N183 Chronic kidney disease, stage 3 (moderate): Secondary | ICD-10-CM | POA: Diagnosis not present

## 2019-01-26 DIAGNOSIS — Z0000 Encounter for general adult medical examination without abnormal findings: Secondary | ICD-10-CM | POA: Diagnosis not present

## 2019-01-26 DIAGNOSIS — I2510 Atherosclerotic heart disease of native coronary artery without angina pectoris: Secondary | ICD-10-CM | POA: Diagnosis not present

## 2019-01-26 DIAGNOSIS — E785 Hyperlipidemia, unspecified: Secondary | ICD-10-CM | POA: Diagnosis not present

## 2019-01-26 DIAGNOSIS — K8020 Calculus of gallbladder without cholecystitis without obstruction: Secondary | ICD-10-CM | POA: Diagnosis not present

## 2019-01-26 DIAGNOSIS — E1121 Type 2 diabetes mellitus with diabetic nephropathy: Secondary | ICD-10-CM | POA: Diagnosis not present

## 2019-01-26 DIAGNOSIS — I6522 Occlusion and stenosis of left carotid artery: Secondary | ICD-10-CM | POA: Diagnosis not present

## 2019-01-26 DIAGNOSIS — E1122 Type 2 diabetes mellitus with diabetic chronic kidney disease: Secondary | ICD-10-CM | POA: Diagnosis not present

## 2019-01-26 DIAGNOSIS — E1169 Type 2 diabetes mellitus with other specified complication: Secondary | ICD-10-CM | POA: Diagnosis not present

## 2019-01-26 DIAGNOSIS — M810 Age-related osteoporosis without current pathological fracture: Secondary | ICD-10-CM | POA: Diagnosis not present

## 2019-01-26 DIAGNOSIS — I130 Hypertensive heart and chronic kidney disease with heart failure and stage 1 through stage 4 chronic kidney disease, or unspecified chronic kidney disease: Secondary | ICD-10-CM | POA: Diagnosis not present

## 2019-01-26 DIAGNOSIS — D649 Anemia, unspecified: Secondary | ICD-10-CM | POA: Diagnosis not present

## 2019-01-26 DIAGNOSIS — R809 Proteinuria, unspecified: Secondary | ICD-10-CM | POA: Diagnosis not present

## 2019-01-26 DIAGNOSIS — K56609 Unspecified intestinal obstruction, unspecified as to partial versus complete obstruction: Secondary | ICD-10-CM | POA: Diagnosis not present

## 2019-01-26 DIAGNOSIS — I25118 Atherosclerotic heart disease of native coronary artery with other forms of angina pectoris: Secondary | ICD-10-CM | POA: Diagnosis not present

## 2019-02-01 DIAGNOSIS — I129 Hypertensive chronic kidney disease with stage 1 through stage 4 chronic kidney disease, or unspecified chronic kidney disease: Secondary | ICD-10-CM | POA: Diagnosis not present

## 2019-02-01 DIAGNOSIS — I2510 Atherosclerotic heart disease of native coronary artery without angina pectoris: Secondary | ICD-10-CM | POA: Diagnosis not present

## 2019-02-01 DIAGNOSIS — E1122 Type 2 diabetes mellitus with diabetic chronic kidney disease: Secondary | ICD-10-CM | POA: Diagnosis not present

## 2019-02-01 DIAGNOSIS — K56609 Unspecified intestinal obstruction, unspecified as to partial versus complete obstruction: Secondary | ICD-10-CM | POA: Diagnosis not present

## 2019-02-01 DIAGNOSIS — N183 Chronic kidney disease, stage 3 (moderate): Secondary | ICD-10-CM | POA: Diagnosis not present

## 2019-02-01 DIAGNOSIS — K8020 Calculus of gallbladder without cholecystitis without obstruction: Secondary | ICD-10-CM | POA: Diagnosis not present

## 2019-02-05 DIAGNOSIS — Z951 Presence of aortocoronary bypass graft: Secondary | ICD-10-CM | POA: Diagnosis not present

## 2019-02-05 DIAGNOSIS — I129 Hypertensive chronic kidney disease with stage 1 through stage 4 chronic kidney disease, or unspecified chronic kidney disease: Secondary | ICD-10-CM | POA: Diagnosis not present

## 2019-02-05 DIAGNOSIS — Z7982 Long term (current) use of aspirin: Secondary | ICD-10-CM | POA: Diagnosis not present

## 2019-02-05 DIAGNOSIS — K8020 Calculus of gallbladder without cholecystitis without obstruction: Secondary | ICD-10-CM | POA: Diagnosis not present

## 2019-02-05 DIAGNOSIS — I2510 Atherosclerotic heart disease of native coronary artery without angina pectoris: Secondary | ICD-10-CM | POA: Diagnosis not present

## 2019-02-05 DIAGNOSIS — K56609 Unspecified intestinal obstruction, unspecified as to partial versus complete obstruction: Secondary | ICD-10-CM | POA: Diagnosis not present

## 2019-02-05 DIAGNOSIS — N183 Chronic kidney disease, stage 3 (moderate): Secondary | ICD-10-CM | POA: Diagnosis not present

## 2019-02-05 DIAGNOSIS — E1122 Type 2 diabetes mellitus with diabetic chronic kidney disease: Secondary | ICD-10-CM | POA: Diagnosis not present

## 2019-02-06 DIAGNOSIS — E1122 Type 2 diabetes mellitus with diabetic chronic kidney disease: Secondary | ICD-10-CM | POA: Diagnosis not present

## 2019-02-06 DIAGNOSIS — I2510 Atherosclerotic heart disease of native coronary artery without angina pectoris: Secondary | ICD-10-CM | POA: Diagnosis not present

## 2019-02-06 DIAGNOSIS — I129 Hypertensive chronic kidney disease with stage 1 through stage 4 chronic kidney disease, or unspecified chronic kidney disease: Secondary | ICD-10-CM | POA: Diagnosis not present

## 2019-02-06 DIAGNOSIS — N183 Chronic kidney disease, stage 3 (moderate): Secondary | ICD-10-CM | POA: Diagnosis not present

## 2019-02-06 DIAGNOSIS — K8020 Calculus of gallbladder without cholecystitis without obstruction: Secondary | ICD-10-CM | POA: Diagnosis not present

## 2019-02-06 DIAGNOSIS — K56609 Unspecified intestinal obstruction, unspecified as to partial versus complete obstruction: Secondary | ICD-10-CM | POA: Diagnosis not present

## 2019-02-12 DIAGNOSIS — K8020 Calculus of gallbladder without cholecystitis without obstruction: Secondary | ICD-10-CM | POA: Diagnosis not present

## 2019-02-12 DIAGNOSIS — N183 Chronic kidney disease, stage 3 (moderate): Secondary | ICD-10-CM | POA: Diagnosis not present

## 2019-02-12 DIAGNOSIS — I2510 Atherosclerotic heart disease of native coronary artery without angina pectoris: Secondary | ICD-10-CM | POA: Diagnosis not present

## 2019-02-12 DIAGNOSIS — K56609 Unspecified intestinal obstruction, unspecified as to partial versus complete obstruction: Secondary | ICD-10-CM | POA: Diagnosis not present

## 2019-02-12 DIAGNOSIS — I129 Hypertensive chronic kidney disease with stage 1 through stage 4 chronic kidney disease, or unspecified chronic kidney disease: Secondary | ICD-10-CM | POA: Diagnosis not present

## 2019-02-12 DIAGNOSIS — E1122 Type 2 diabetes mellitus with diabetic chronic kidney disease: Secondary | ICD-10-CM | POA: Diagnosis not present

## 2019-02-18 DIAGNOSIS — E1122 Type 2 diabetes mellitus with diabetic chronic kidney disease: Secondary | ICD-10-CM | POA: Diagnosis not present

## 2019-02-18 DIAGNOSIS — K56609 Unspecified intestinal obstruction, unspecified as to partial versus complete obstruction: Secondary | ICD-10-CM | POA: Diagnosis not present

## 2019-02-18 DIAGNOSIS — I129 Hypertensive chronic kidney disease with stage 1 through stage 4 chronic kidney disease, or unspecified chronic kidney disease: Secondary | ICD-10-CM | POA: Diagnosis not present

## 2019-02-18 DIAGNOSIS — K8020 Calculus of gallbladder without cholecystitis without obstruction: Secondary | ICD-10-CM | POA: Diagnosis not present

## 2019-02-18 DIAGNOSIS — N183 Chronic kidney disease, stage 3 (moderate): Secondary | ICD-10-CM | POA: Diagnosis not present

## 2019-02-18 DIAGNOSIS — I2510 Atherosclerotic heart disease of native coronary artery without angina pectoris: Secondary | ICD-10-CM | POA: Diagnosis not present

## 2019-02-22 DIAGNOSIS — K8020 Calculus of gallbladder without cholecystitis without obstruction: Secondary | ICD-10-CM | POA: Diagnosis not present

## 2019-02-22 DIAGNOSIS — N183 Chronic kidney disease, stage 3 (moderate): Secondary | ICD-10-CM | POA: Diagnosis not present

## 2019-02-22 DIAGNOSIS — I2510 Atherosclerotic heart disease of native coronary artery without angina pectoris: Secondary | ICD-10-CM | POA: Diagnosis not present

## 2019-02-22 DIAGNOSIS — E1122 Type 2 diabetes mellitus with diabetic chronic kidney disease: Secondary | ICD-10-CM | POA: Diagnosis not present

## 2019-02-22 DIAGNOSIS — I129 Hypertensive chronic kidney disease with stage 1 through stage 4 chronic kidney disease, or unspecified chronic kidney disease: Secondary | ICD-10-CM | POA: Diagnosis not present

## 2019-02-22 DIAGNOSIS — K56609 Unspecified intestinal obstruction, unspecified as to partial versus complete obstruction: Secondary | ICD-10-CM | POA: Diagnosis not present

## 2019-02-24 DIAGNOSIS — E1122 Type 2 diabetes mellitus with diabetic chronic kidney disease: Secondary | ICD-10-CM | POA: Diagnosis not present

## 2019-02-24 DIAGNOSIS — I129 Hypertensive chronic kidney disease with stage 1 through stage 4 chronic kidney disease, or unspecified chronic kidney disease: Secondary | ICD-10-CM | POA: Diagnosis not present

## 2019-02-24 DIAGNOSIS — I2510 Atherosclerotic heart disease of native coronary artery without angina pectoris: Secondary | ICD-10-CM | POA: Diagnosis not present

## 2019-02-24 DIAGNOSIS — K56609 Unspecified intestinal obstruction, unspecified as to partial versus complete obstruction: Secondary | ICD-10-CM | POA: Diagnosis not present

## 2019-02-24 DIAGNOSIS — K8020 Calculus of gallbladder without cholecystitis without obstruction: Secondary | ICD-10-CM | POA: Diagnosis not present

## 2019-02-24 DIAGNOSIS — N183 Chronic kidney disease, stage 3 (moderate): Secondary | ICD-10-CM | POA: Diagnosis not present

## 2019-03-01 DIAGNOSIS — K8020 Calculus of gallbladder without cholecystitis without obstruction: Secondary | ICD-10-CM | POA: Diagnosis not present

## 2019-03-01 DIAGNOSIS — E1122 Type 2 diabetes mellitus with diabetic chronic kidney disease: Secondary | ICD-10-CM | POA: Diagnosis not present

## 2019-03-01 DIAGNOSIS — N183 Chronic kidney disease, stage 3 (moderate): Secondary | ICD-10-CM | POA: Diagnosis not present

## 2019-03-01 DIAGNOSIS — I2510 Atherosclerotic heart disease of native coronary artery without angina pectoris: Secondary | ICD-10-CM | POA: Diagnosis not present

## 2019-03-01 DIAGNOSIS — K56609 Unspecified intestinal obstruction, unspecified as to partial versus complete obstruction: Secondary | ICD-10-CM | POA: Diagnosis not present

## 2019-03-01 DIAGNOSIS — I129 Hypertensive chronic kidney disease with stage 1 through stage 4 chronic kidney disease, or unspecified chronic kidney disease: Secondary | ICD-10-CM | POA: Diagnosis not present

## 2019-03-03 ENCOUNTER — Other Ambulatory Visit: Payer: Self-pay

## 2019-03-03 MED ORDER — HYDRALAZINE HCL 25 MG PO TABS: 25.0000 mg | ORAL_TABLET | Freq: Three times a day (TID) | ORAL | 3 refills | Status: DC

## 2019-04-26 DIAGNOSIS — I872 Venous insufficiency (chronic) (peripheral): Secondary | ICD-10-CM | POA: Diagnosis not present

## 2019-04-26 DIAGNOSIS — I509 Heart failure, unspecified: Secondary | ICD-10-CM | POA: Diagnosis not present

## 2019-04-26 DIAGNOSIS — N183 Chronic kidney disease, stage 3 (moderate): Secondary | ICD-10-CM | POA: Diagnosis not present

## 2019-04-26 DIAGNOSIS — M810 Age-related osteoporosis without current pathological fracture: Secondary | ICD-10-CM | POA: Diagnosis not present

## 2019-04-26 DIAGNOSIS — E1169 Type 2 diabetes mellitus with other specified complication: Secondary | ICD-10-CM | POA: Diagnosis not present

## 2019-04-26 DIAGNOSIS — I130 Hypertensive heart and chronic kidney disease with heart failure and stage 1 through stage 4 chronic kidney disease, or unspecified chronic kidney disease: Secondary | ICD-10-CM | POA: Diagnosis not present

## 2019-04-26 DIAGNOSIS — M170 Bilateral primary osteoarthritis of knee: Secondary | ICD-10-CM | POA: Diagnosis not present

## 2019-05-07 DIAGNOSIS — Z23 Encounter for immunization: Secondary | ICD-10-CM | POA: Diagnosis not present

## 2019-06-08 DIAGNOSIS — M810 Age-related osteoporosis without current pathological fracture: Secondary | ICD-10-CM | POA: Diagnosis not present

## 2019-06-08 DIAGNOSIS — N1832 Chronic kidney disease, stage 3b: Secondary | ICD-10-CM | POA: Diagnosis not present

## 2019-07-07 DIAGNOSIS — I878 Other specified disorders of veins: Secondary | ICD-10-CM | POA: Diagnosis not present

## 2019-07-07 DIAGNOSIS — N1832 Chronic kidney disease, stage 3b: Secondary | ICD-10-CM | POA: Diagnosis not present

## 2019-07-07 DIAGNOSIS — M810 Age-related osteoporosis without current pathological fracture: Secondary | ICD-10-CM | POA: Diagnosis not present

## 2019-07-07 DIAGNOSIS — I130 Hypertensive heart and chronic kidney disease with heart failure and stage 1 through stage 4 chronic kidney disease, or unspecified chronic kidney disease: Secondary | ICD-10-CM | POA: Diagnosis not present

## 2019-07-07 DIAGNOSIS — D649 Anemia, unspecified: Secondary | ICD-10-CM | POA: Diagnosis not present

## 2019-07-07 DIAGNOSIS — E1169 Type 2 diabetes mellitus with other specified complication: Secondary | ICD-10-CM | POA: Diagnosis not present

## 2019-07-13 ENCOUNTER — Other Ambulatory Visit (HOSPITAL_COMMUNITY): Payer: Self-pay | Admitting: *Deleted

## 2019-07-13 DIAGNOSIS — E1169 Type 2 diabetes mellitus with other specified complication: Secondary | ICD-10-CM | POA: Diagnosis not present

## 2019-07-13 DIAGNOSIS — N1832 Chronic kidney disease, stage 3b: Secondary | ICD-10-CM | POA: Diagnosis not present

## 2019-07-14 ENCOUNTER — Other Ambulatory Visit: Payer: Self-pay

## 2019-07-14 ENCOUNTER — Ambulatory Visit (HOSPITAL_COMMUNITY)
Admission: RE | Admit: 2019-07-14 | Discharge: 2019-07-14 | Disposition: A | Discharge status: Home / Self Care | Payer: Medicare Other | Source: Ambulatory Visit | Attending: Internal Medicine | Admitting: Internal Medicine

## 2019-07-14 DIAGNOSIS — R9082 White matter disease, unspecified: Secondary | ICD-10-CM | POA: Diagnosis not present

## 2019-07-14 DIAGNOSIS — Z20828 Contact with and (suspected) exposure to other viral communicable diseases: Secondary | ICD-10-CM | POA: Diagnosis not present

## 2019-07-14 DIAGNOSIS — D72829 Elevated white blood cell count, unspecified: Secondary | ICD-10-CM | POA: Diagnosis not present

## 2019-07-14 DIAGNOSIS — R5383 Other fatigue: Secondary | ICD-10-CM | POA: Diagnosis not present

## 2019-07-14 DIAGNOSIS — M810 Age-related osteoporosis without current pathological fracture: Secondary | ICD-10-CM | POA: Insufficient documentation

## 2019-07-14 DIAGNOSIS — E870 Hyperosmolality and hypernatremia: Secondary | ICD-10-CM | POA: Diagnosis not present

## 2019-07-14 DIAGNOSIS — B961 Klebsiella pneumoniae [K. pneumoniae] as the cause of diseases classified elsewhere: Secondary | ICD-10-CM | POA: Diagnosis not present

## 2019-07-14 DIAGNOSIS — F05 Delirium due to known physiological condition: Secondary | ICD-10-CM | POA: Diagnosis not present

## 2019-07-14 DIAGNOSIS — K56609 Unspecified intestinal obstruction, unspecified as to partial versus complete obstruction: Secondary | ICD-10-CM | POA: Diagnosis not present

## 2019-07-14 DIAGNOSIS — K819 Cholecystitis, unspecified: Secondary | ICD-10-CM | POA: Diagnosis not present

## 2019-07-14 DIAGNOSIS — R4182 Altered mental status, unspecified: Secondary | ICD-10-CM | POA: Diagnosis not present

## 2019-07-14 DIAGNOSIS — R0902 Hypoxemia: Secondary | ICD-10-CM | POA: Diagnosis not present

## 2019-07-14 DIAGNOSIS — N390 Urinary tract infection, site not specified: Secondary | ICD-10-CM | POA: Diagnosis not present

## 2019-07-14 MED ORDER — DENOSUMAB 60 MG/ML ~~LOC~~ SOSY
PREFILLED_SYRINGE | SUBCUTANEOUS | Status: AC
  Administered 2019-07-14: 60 mg via SUBCUTANEOUS
  Filled 2019-07-14: qty 1

## 2019-07-14 MED ORDER — DENOSUMAB 60 MG/ML ~~LOC~~ SOSY: 60.0000 mg | PREFILLED_SYRINGE | Freq: Once | SUBCUTANEOUS | Status: AC

## 2019-07-14 NOTE — Discharge Instructions (Signed)
Denosumab injection °What is this medicine? °DENOSUMAB (den oh sue mab) slows bone breakdown. Prolia is used to treat osteoporosis in women after menopause and in men, and in people who are taking corticosteroids for 6 months or more. Xgeva is used to treat a high calcium level due to cancer and to prevent bone fractures and other bone problems caused by multiple myeloma or cancer bone metastases. Xgeva is also used to treat giant cell tumor of the bone. °This medicine may be used for other purposes; ask your health care provider or pharmacist if you have questions. °COMMON BRAND NAME(S): Prolia, XGEVA °What should I tell my health care provider before I take this medicine? °They need to know if you have any of these conditions: °· dental disease °· having surgery or tooth extraction °· infection °· kidney disease °· low levels of calcium or Vitamin D in the blood °· malnutrition °· on hemodialysis °· skin conditions or sensitivity °· thyroid or parathyroid disease °· an unusual reaction to denosumab, other medicines, foods, dyes, or preservatives °· pregnant or trying to get pregnant °· breast-feeding °How should I use this medicine? °This medicine is for injection under the skin. It is given by a health care professional in a hospital or clinic setting. °A special MedGuide will be given to you before each treatment. Be sure to read this information carefully each time. °For Prolia, talk to your pediatrician regarding the use of this medicine in children. Special care may be needed. For Xgeva, talk to your pediatrician regarding the use of this medicine in children. While this drug may be prescribed for children as young as 13 years for selected conditions, precautions do apply. °Overdosage: If you think you have taken too much of this medicine contact a poison control center or emergency room at once. °NOTE: This medicine is only for you. Do not share this medicine with others. °What if I miss a dose? °It is  important not to miss your dose. Call your doctor or health care professional if you are unable to keep an appointment. °What may interact with this medicine? °Do not take this medicine with any of the following medications: °· other medicines containing denosumab °This medicine may also interact with the following medications: °· medicines that lower your chance of fighting infection °· steroid medicines like prednisone or cortisone °This list may not describe all possible interactions. Give your health care provider a list of all the medicines, herbs, non-prescription drugs, or dietary supplements you use. Also tell them if you smoke, drink alcohol, or use illegal drugs. Some items may interact with your medicine. °What should I watch for while using this medicine? °Visit your doctor or health care professional for regular checks on your progress. Your doctor or health care professional may order blood tests and other tests to see how you are doing. °Call your doctor or health care professional for advice if you get a fever, chills or sore throat, or other symptoms of a cold or flu. Do not treat yourself. This drug may decrease your body's ability to fight infection. Try to avoid being around people who are sick. °You should make sure you get enough calcium and vitamin D while you are taking this medicine, unless your doctor tells you not to. Discuss the foods you eat and the vitamins you take with your health care professional. °See your dentist regularly. Brush and floss your teeth as directed. Before you have any dental work done, tell your dentist you are   receiving this medicine. Do not become pregnant while taking this medicine or for 5 months after stopping it. Talk with your doctor or health care professional about your birth control options while taking this medicine. Women should inform their doctor if they wish to become pregnant or think they might be pregnant. There is a potential for serious side  effects to an unborn child. Talk to your health care professional or pharmacist for more information. What side effects may I notice from receiving this medicine? Side effects that you should report to your doctor or health care professional as soon as possible:  allergic reactions like skin rash, itching or hives, swelling of the face, lips, or tongue  bone pain  breathing problems  dizziness  jaw pain, especially after dental work  redness, blistering, peeling of the skin  signs and symptoms of infection like fever or chills; cough; sore throat; pain or trouble passing urine  signs of low calcium like fast heartbeat, muscle cramps or muscle pain; pain, tingling, numbness in the hands or feet; seizures  unusual bleeding or bruising  unusually weak or tired Side effects that usually do not require medical attention (report to your doctor or health care professional if they continue or are bothersome):  constipation  diarrhea  headache  joint pain  loss of appetite  muscle pain  runny nose  tiredness  upset stomach This list may not describe all possible side effects. Call your doctor for medical advice about side effects. You may report side effects to FDA at 1-800-FDA-1088. Where should I keep my medicine? This medicine is only given in a clinic, doctor's office, or other health care setting and will not be stored at home. NOTE: This sheet is a summary. It may not cover all possible information. If you have questions about this medicine, talk to your doctor, pharmacist, or health care provider.  2020 Elsevier/Gold Standard (2017-11-28 16:10:44)

## 2019-07-16 ENCOUNTER — Encounter (HOSPITAL_COMMUNITY): Payer: Self-pay

## 2019-07-16 ENCOUNTER — Emergency Department (HOSPITAL_COMMUNITY): Payer: Medicare Other

## 2019-07-16 ENCOUNTER — Inpatient Hospital Stay (HOSPITAL_COMMUNITY)
Admission: EM | Admit: 2019-07-16 | Discharge: 2019-07-20 | DRG: 389 | Disposition: A | Discharge status: Home / Self Care | Payer: Medicare Other | Attending: Internal Medicine | Admitting: Internal Medicine

## 2019-07-16 ENCOUNTER — Other Ambulatory Visit: Payer: Self-pay

## 2019-07-16 DIAGNOSIS — R0902 Hypoxemia: Secondary | ICD-10-CM | POA: Diagnosis not present

## 2019-07-16 DIAGNOSIS — R778 Other specified abnormalities of plasma proteins: Secondary | ICD-10-CM | POA: Diagnosis present

## 2019-07-16 DIAGNOSIS — Z0189 Encounter for other specified special examinations: Secondary | ICD-10-CM

## 2019-07-16 DIAGNOSIS — Z951 Presence of aortocoronary bypass graft: Secondary | ICD-10-CM

## 2019-07-16 DIAGNOSIS — E1122 Type 2 diabetes mellitus with diabetic chronic kidney disease: Secondary | ICD-10-CM | POA: Diagnosis present

## 2019-07-16 DIAGNOSIS — R4182 Altered mental status, unspecified: Secondary | ICD-10-CM | POA: Diagnosis not present

## 2019-07-16 DIAGNOSIS — E785 Hyperlipidemia, unspecified: Secondary | ICD-10-CM | POA: Diagnosis present

## 2019-07-16 DIAGNOSIS — K56609 Unspecified intestinal obstruction, unspecified as to partial versus complete obstruction: Secondary | ICD-10-CM | POA: Diagnosis not present

## 2019-07-16 DIAGNOSIS — E1142 Type 2 diabetes mellitus with diabetic polyneuropathy: Secondary | ICD-10-CM | POA: Diagnosis present

## 2019-07-16 DIAGNOSIS — D72829 Elevated white blood cell count, unspecified: Secondary | ICD-10-CM

## 2019-07-16 DIAGNOSIS — E1151 Type 2 diabetes mellitus with diabetic peripheral angiopathy without gangrene: Secondary | ICD-10-CM | POA: Diagnosis present

## 2019-07-16 DIAGNOSIS — B961 Klebsiella pneumoniae [K. pneumoniae] as the cause of diseases classified elsewhere: Secondary | ICD-10-CM | POA: Diagnosis present

## 2019-07-16 DIAGNOSIS — K819 Cholecystitis, unspecified: Secondary | ICD-10-CM

## 2019-07-16 DIAGNOSIS — I10 Essential (primary) hypertension: Secondary | ICD-10-CM | POA: Diagnosis present

## 2019-07-16 DIAGNOSIS — Z7983 Long term (current) use of bisphosphonates: Secondary | ICD-10-CM

## 2019-07-16 DIAGNOSIS — R5383 Other fatigue: Secondary | ICD-10-CM | POA: Diagnosis not present

## 2019-07-16 DIAGNOSIS — N183 Chronic kidney disease, stage 3 unspecified: Secondary | ICD-10-CM | POA: Diagnosis present

## 2019-07-16 DIAGNOSIS — I2510 Atherosclerotic heart disease of native coronary artery without angina pectoris: Secondary | ICD-10-CM | POA: Diagnosis present

## 2019-07-16 DIAGNOSIS — Z20828 Contact with and (suspected) exposure to other viral communicable diseases: Secondary | ICD-10-CM | POA: Diagnosis present

## 2019-07-16 DIAGNOSIS — E119 Type 2 diabetes mellitus without complications: Secondary | ICD-10-CM

## 2019-07-16 DIAGNOSIS — I252 Old myocardial infarction: Secondary | ICD-10-CM

## 2019-07-16 DIAGNOSIS — E876 Hypokalemia: Secondary | ICD-10-CM | POA: Diagnosis present

## 2019-07-16 DIAGNOSIS — N390 Urinary tract infection, site not specified: Secondary | ICD-10-CM | POA: Diagnosis present

## 2019-07-16 DIAGNOSIS — F05 Delirium due to known physiological condition: Secondary | ICD-10-CM | POA: Diagnosis present

## 2019-07-16 DIAGNOSIS — N1831 Chronic kidney disease, stage 3a: Secondary | ICD-10-CM | POA: Diagnosis present

## 2019-07-16 DIAGNOSIS — E870 Hyperosmolality and hypernatremia: Secondary | ICD-10-CM | POA: Diagnosis present

## 2019-07-16 DIAGNOSIS — K8020 Calculus of gallbladder without cholecystitis without obstruction: Secondary | ICD-10-CM | POA: Diagnosis present

## 2019-07-16 DIAGNOSIS — Z7982 Long term (current) use of aspirin: Secondary | ICD-10-CM

## 2019-07-16 DIAGNOSIS — K828 Other specified diseases of gallbladder: Secondary | ICD-10-CM | POA: Diagnosis present

## 2019-07-16 DIAGNOSIS — I129 Hypertensive chronic kidney disease with stage 1 through stage 4 chronic kidney disease, or unspecified chronic kidney disease: Secondary | ICD-10-CM | POA: Diagnosis present

## 2019-07-16 DIAGNOSIS — R7989 Other specified abnormal findings of blood chemistry: Secondary | ICD-10-CM | POA: Diagnosis present

## 2019-07-16 DIAGNOSIS — D649 Anemia, unspecified: Secondary | ICD-10-CM | POA: Diagnosis present

## 2019-07-16 LAB — CBC WITH DIFFERENTIAL/PLATELET
Abs Immature Granulocytes: 0.08 10*3/uL — ABNORMAL HIGH (ref 0.00–0.07)
Basophils Absolute: 0 10*3/uL (ref 0.0–0.1)
Basophils Relative: 0 %
Eosinophils Absolute: 0 10*3/uL (ref 0.0–0.5)
Eosinophils Relative: 0 %
HCT: 36.4 % (ref 36.0–46.0)
Hemoglobin: 11.3 g/dL — ABNORMAL LOW (ref 12.0–15.0)
Immature Granulocytes: 0 %
Lymphocytes Relative: 4 %
Lymphs Abs: 0.8 10*3/uL (ref 0.7–4.0)
MCH: 26 pg (ref 26.0–34.0)
MCHC: 31 g/dL (ref 30.0–36.0)
MCV: 83.7 fL (ref 80.0–100.0)
Monocytes Absolute: 1.1 10*3/uL — ABNORMAL HIGH (ref 0.1–1.0)
Monocytes Relative: 6 %
Neutro Abs: 16.2 10*3/uL — ABNORMAL HIGH (ref 1.7–7.7)
Neutrophils Relative %: 90 %
Platelets: 173 10*3/uL (ref 150–400)
RBC: 4.35 MIL/uL (ref 3.87–5.11)
RDW: 15.8 % — ABNORMAL HIGH (ref 11.5–15.5)
WBC: 18.2 10*3/uL — ABNORMAL HIGH (ref 4.0–10.5)
nRBC: 0 % (ref 0.0–0.2)

## 2019-07-16 LAB — URINALYSIS, ROUTINE W REFLEX MICROSCOPIC
Bilirubin Urine: NEGATIVE
Glucose, UA: 150 mg/dL — AB
Hgb urine dipstick: NEGATIVE
Ketones, ur: NEGATIVE mg/dL
Nitrite: NEGATIVE
Protein, ur: 100 mg/dL — AB
Specific Gravity, Urine: 1.017 (ref 1.005–1.030)
pH: 7 (ref 5.0–8.0)

## 2019-07-16 LAB — PROTIME-INR
INR: 1.1 (ref 0.8–1.2)
Prothrombin Time: 14 seconds (ref 11.4–15.2)

## 2019-07-16 LAB — COMPREHENSIVE METABOLIC PANEL
ALT: 10 U/L (ref 0–44)
AST: 16 U/L (ref 15–41)
Albumin: 4 g/dL (ref 3.5–5.0)
Alkaline Phosphatase: 67 U/L (ref 38–126)
Anion gap: 15 (ref 5–15)
BUN: 42 mg/dL — ABNORMAL HIGH (ref 8–23)
CO2: 27 mmol/L (ref 22–32)
Calcium: 9.2 mg/dL (ref 8.9–10.3)
Chloride: 102 mmol/L (ref 98–111)
Creatinine, Ser: 1.61 mg/dL — ABNORMAL HIGH (ref 0.44–1.00)
GFR calc Af Amer: 32 mL/min — ABNORMAL LOW (ref >60)
GFR calc non Af Amer: 28 mL/min — ABNORMAL LOW (ref >60)
Glucose, Bld: 284 mg/dL — ABNORMAL HIGH (ref 70–99)
Potassium: 3.8 mmol/L (ref 3.5–5.1)
Sodium: 144 mmol/L (ref 135–145)
Total Bilirubin: 1 mg/dL (ref 0.3–1.2)
Total Protein: 7.3 g/dL (ref 6.5–8.1)

## 2019-07-16 LAB — LACTIC ACID, PLASMA: Lactic Acid, Venous: 1.7 mmol/L (ref 0.5–1.9)

## 2019-07-16 LAB — AMMONIA: Ammonia: 14 umol/L (ref 9–35)

## 2019-07-16 LAB — POC SARS CORONAVIRUS 2 AG -  ED: SARS Coronavirus 2 Ag: NEGATIVE

## 2019-07-16 LAB — LIPASE, BLOOD: Lipase: 25 U/L (ref 11–51)

## 2019-07-16 LAB — TSH: TSH: 3.156 u[IU]/mL (ref 0.350–4.500)

## 2019-07-16 LAB — TROPONIN I (HIGH SENSITIVITY): Troponin I (High Sensitivity): 36 ng/L — ABNORMAL HIGH (ref <18)

## 2019-07-16 NOTE — ED Notes (Signed)
Per EMS pt AOx4, pt refusing/unable to answer questions at this time making assessment difficult.

## 2019-07-16 NOTE — ED Notes (Signed)
Pt SPO2 89% RA, pt placed on 2 lpm O2 via [place]

## 2019-07-16 NOTE — ED Triage Notes (Signed)
Pt bib [facility] w/ c/o generalized body aches and weakness. Pt AOx4, no fever or cough. EMS VSS

## 2019-07-16 NOTE — ED Provider Notes (Signed)
11:28 PM  Assumed care from Dr. Sherry Ruffing.  Patient here with AMS and weakness.  Unclear baseline and LSN.  Unable to get in touch with family.  Hypoxic to 86% on RA.   Has leukocytosis of 18,000 but no fever.    12:00 AM  Husband at bedside.  Spoke with Dr. Sherry Ruffing.  Reports patient normally functional at home.  Husband reports N/V today and she presents similarly with SBO.  Will obtain CTAP.    1:50 AM  Pt's urine appears slightly infected.  Will give IV Rocephin.  Culture pending.  Previous culture in 2019 grew Klebsiella sensitive to cephalosporins.  Chest x-ray clear.  D-dimer significantly elevated.  CT of the head, abdomen pelvis pending.  Will add on CTA of the chest.  She does have history of chronic kidney disease and creatinine is stable.  Will give reduce IV contrast dose.  We will continue to hydrate patient.  5:15 AM  Pt's head CT shows no acute abnormality.  CT of the chest shows no pulmonary embolus.  She does have a small left pleural effusion with basilar atelectasis but no signs of aspiration or consolidation.  CT of the abdomen pelvis shows a small bowel obstruction with no clear transition point but similar to study in May 2020.  She also has a dilated and thickened gallbladder with a large complex gallstone that is unchanged since May 2020.  It appears surgery saw patient during her last admission for this and patient refused HIDA scan.  She has received Rocephin for UTI.  We will add on Flagyl.  Will place NG tube and discuss with medicine for admission.  We will also consult surgery given this bowel obstruction and abnormal appearing gallbladder.  Likely would be very poor surgical candidate.  5:41 AM Discussed patient's case with hospitatlist, Dr. Maudie Mercury.  I have recommended admission and patient (and family if present) agree with this plan. Admitting physician will place admission orders.   I reviewed all nursing notes, vitals, pertinent previous records and interpreted all EKGs, lab  and urine results, imaging (as available).  5:51 AM  Discussed with Dr. Grandville Silos on-call for general surgery.  Appreciate his help.  Surgery will see patient in consultation.   CRITICAL CARE Performed by: Pryor Curia   Total critical care time: 45 minutes  Critical care time was exclusive of separately billable procedures and treating other patients.  Critical care was necessary to treat or prevent imminent or life-threatening deterioration.  Critical care was time spent personally by me on the following activities: development of treatment plan with patient and/or surrogate as well as nursing, discussions with consultants, evaluation of patient's response to treatment, examination of patient, obtaining history from patient or surrogate, ordering and performing treatments and interventions, ordering and review of laboratory studies, ordering and review of radiographic studies, pulse oximetry and re-evaluation of patient's condition.    Boysie Bonebrake, Delice Bison, DO 07/17/19 [phone]

## 2019-07-16 NOTE — ED Provider Notes (Signed)
[facility] Provider Note   CSN: [id number] Arrival date & time: 07/16/19  2130     History Chief Complaint  Patient presents with  . Generalized Body Aches    [place] Chavez is a 85 y.o. female.  The history is provided by the EMS personnel and medical records (EMS report from nursing). The history is limited by the condition of the patient.  Altered Mental Status Presenting symptoms: confusion   Severity:  Unable to specify Episode history:  Unable to specify Timing:  Unable to specify Progression:  Unable to specify Associated symptoms: no abdominal pain and no agitation        LVL 5 caveat for AMS  Past Medical History:  Diagnosis Date  . Arthritis    "qwhere"  . Coronary artery disease   . Hypercholesteremia   . Hypertension   . Myocardial infarction (HCC) 2003  . SBO (small bowel obstruction) (HCC) 12/18/2016  . Stenosis of subclavian artery (HCC)   . Type II diabetes mellitus [facility](HCC)     Patient Active Problem List   Diagnosis Date Noted  . Cholelithiasis 01/02/2019  . CKD (chronic kidney disease), stage III 10/21/2017  . Delirium due to another medical condition 12/18/2016  . SBO (small bowel obstruction) (HCC) 12/18/2016  . 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 Leukocytosis 12/18/2016  . Peripheral artery disease (HCC) 11/08/2014  . PAD (peripheral artery disease) (HCC) 10/24/2014  . LBBB (left bundle branch block) 09/27/2014    Class: Chronic  . Subclavian artery stenosis, left (HCC) 09/27/2014  . Numbness and tingling in left hand 03/08/2013  . HTN (hypertension) 03/08/2013  . DM (diabetes mellitus) (HCC) 03/08/2013  . CAD (coronary artery disease) 03/08/2013    Past Surgical History:  Procedure Laterality Date  . ABDOMINAL HYSTERECTOMY  1970  . BOWEL RESECTION N/A 12/20/2016   Procedure: SMALL BOWEL RESECTION;  Surgeon: Abigail MiyamotoBlackman, Douglas, MD;  Location: [facility] OR;  Service: General;  Laterality: N/A;    . CARDIAC CATHETERIZATION    . CATARACT EXTRACTION Bilateral 2000's  . [place]ONARY [place]  [place]  . LAPAROTOMY N/A 12/20/2016   Procedure: EXPLORATORY LAPAROTOMY;  Surgeon: Abigail MiyamotoBlackman, Douglas, MD;  [facility];  Service: General;  Laterality: N/A;  . LEFT HEART CATHETERIZATION WITH CORONARY/GRAFT ANGIOGRAM N/A 09/27/2014   Procedure: LEFT HEART CATHETERIZATION WITH Isabel CapriceORONARY/GRAFT ANGIOGRAM;  Surgeon: Pamella PertJagadeesh R Ganji, MD;  [facility];  Service: Cardiovascular;  Laterality: N/A;  . UNILATERAL UPPER EXTREMEITY ANGIOGRAM N/A 09/27/2014   Procedure: SUBCLAVIAN ARTERIOGRAM ;  Surgeon: Pamella PertJagadeesh R Ganji, MD;  [facility];  Service: Cardiovascular;  Laterality: N/A;  . UNILATERAL UPPER EXTREMEITY ANGIOGRAM N/A 11/09/2014   Procedure: Left subclavian stent ;  Surgeon: Yates DecampJay Ganji, MD;  [facility];  Service: Cardiovascular;  Laterality: N/A;  . VISCERAL ANGIOGRAM N/A 10/25/2014   Procedure: Laurence SlateSUBCLAVIAN ANGIOGRAM;  Surgeon: Yates DecampJay Ganji, MD;  [facility];  Service: Cardiovascular;  Laterality: N/A;     OB History   No obstetric history on file.     Family History  Problem Relation Age of Onset  . Gallbladder disease Neg Hx     Social History   Tobacco Use  . Smoking status: Never Smoker  . Smokeless tobacco: Never Used  Substance Use Topics  . Alcohol use: No  . Drug use: No    Home Medications Prior to Admission medications   Medication Sig Start Date End Date  Taking? Authorizing Provider  alendronate (FOSAMAX) 70 MG tablet Take 70 mg by mouth every Monday.  12/08/14   [provider]  amLODipine (NORVASC) 10 MG tablet Take 10 mg by mouth at bedtime.  10/12/14   [provider]  aspirin EC 81 MG tablet Take 81 mg by mouth at bedtime.     [provider]  Calcium Citrate-Vitamin D (CALCIUM CITRATE + PO) Take 1 tablet by mouth daily.     [provider]  carvedilol (COREG) 6.25 MG tablet  Take 12.5 mg by mouth 2 (two) times daily. 10/14/18   [provider]  gabapentin (NEURONTIN) 300 MG capsule Take 300 mg by mouth at bedtime.  04/09/17   [provider]  hydrALAZINE (APRESOLINE) 25 MG tablet Take 1 tablet (25 mg total) by mouth 3 (three) times daily. 03/03/19   Yates Decamp, MD  lovastatin (MEVACOR) 40 MG tablet Take 40 mg by mouth every evening. 10/13/18   [provider]    Allergies    Statins, Crestor [rosuvastatin], Lipitor [atorvastatin], and Zocor [simvastatin]  Review of Systems   Review of Systems  Unable to perform ROS: Mental status change  Gastrointestinal: Negative for abdominal pain.  Psychiatric/Behavioral: Positive for confusion. Negative for agitation.    Physical Exam Updated Vital Signs BP (!) 177/75   Pulse 81   Temp 98.4 F (36.9 C) (Oral)   Resp (!) 22   SpO2 98%   Physical Exam Vitals and nursing note reviewed.  Constitutional:      General: She is not in acute distress.    Appearance: She is well-developed. She is not ill-appearing, toxic-appearing or diaphoretic.  HENT:     Head: Normocephalic and atraumatic.     Right Ear: External ear normal.     Left Ear: External ear normal.     Nose: Nose normal. No congestion or rhinorrhea.     Mouth/Throat:     Mouth: Mucous membranes are dry.     Pharynx: No oropharyngeal exudate or posterior oropharyngeal erythema.  Eyes:     Extraocular Movements: Extraocular movements intact.     Conjunctiva/sclera: Conjunctivae normal.     Pupils: Pupils are equal, round, and reactive to light.  Cardiovascular:     Rate and Rhythm: Normal rate and regular rhythm.     Pulses: Normal pulses.     Heart sounds: No murmur.  Pulmonary:     Effort: No respiratory distress.     Breath sounds: No stridor. Rhonchi present. No wheezing or rales.  Chest:     Chest wall: No tenderness.  Abdominal:     General: Abdomen is flat. There is no distension.     Tenderness: There is no  abdominal tenderness. There is no right CVA tenderness, left CVA tenderness or rebound.  Musculoskeletal:        General: No tenderness.     Cervical back: Normal range of motion and neck supple.  Skin:    General: Skin is warm.     Findings: No erythema or rash.  Neurological:     Mental Status: She is alert.     GCS: GCS eye subscore is 4. GCS verbal subscore is 3. GCS motor subscore is 5.     Motor: No abnormal muscle tone.     Deep Tendon Reflexes: Reflexes are normal and symmetric.     Comments: Patient is only repeating "uh huh" to all questions asked.  Family is not present to provide history.  Unclear what  her baseline is.  She is moving all extremities and does not appear to have numbness.     ED Results / Procedures / Treatments   Labs (all labs ordered are listed, but only abnormal results are displayed) Labs Reviewed  CBC WITH DIFFERENTIAL/PLATELET - Abnormal; Notable for the following components:      Result Value   WBC 18.2 (*)    Hemoglobin 11.3 (*)    RDW 15.8 (*)    Neutro Abs 16.2 (*)    Monocytes Absolute 1.1 (*)    Abs Immature Granulocytes 0.08 (*)    All other components within normal limits  COMPREHENSIVE METABOLIC PANEL - Abnormal; Notable for the following components:   Glucose, Bld 284 (*)    BUN 42 (*)    Creatinine, Ser 1.61 (*)    GFR calc non Af Amer 28 (*)    GFR calc Af Amer 32 (*)    All other components within normal limits  URINALYSIS, ROUTINE W REFLEX MICROSCOPIC - Abnormal; Notable for the following components:   APPearance HAZY (*)    Glucose, UA 150 (*)    Protein, ur 100 (*)    Leukocytes,Ua LARGE (*)    Bacteria, UA RARE (*)    Non Squamous Epithelial 0-5 (*)    All other components within normal limits  TROPONIN I (HIGH SENSITIVITY) - Abnormal; Notable for the following components:   Troponin I (High Sensitivity) 36 (*)    All other components within normal limits  URINE CULTURE  CULTURE, BLOOD (ROUTINE X 2)  CULTURE, BLOOD  (ROUTINE X 2)  SARS CORONAVIRUS 2 (TAT 6-24 HRS)  AMMONIA  PROTIME-INR  TSH  LACTIC ACID, PLASMA  LIPASE, BLOOD  LACTIC ACID, PLASMA  BRAIN NATRIURETIC PEPTIDE  D-DIMER, QUANTITATIVE (NOT AT [facility])  RAPID URINE DRUG SCREEN, HOSP PERFORMED  POC SARS CORONAVIRUS 2 AG -  ED  TROPONIN I (HIGH SENSITIVITY)    EKG EKG Interpretation  Date/Time:  Friday July 16 2019 21:30:56 EST Ventricular Rate:  80 PR Interval:    QRS Duration: 150 QT Interval:  440 QTC Calculation: 508 R Axis:   133 Text Interpretation: Sinus rhythm Consider left ventricular hypertrophy Borderline T abnormalities, lateral leads Prolonged QT interval When compared to prior, less wandering baseline. NO STEMI Confirmed by Theda Belfast ([id number]) on 07/16/2019 9:33:50 PM   Radiology DG Chest Portable 1 View  Result Date: 07/16/2019 CLINICAL DATA:  Generalized weakness EXAM: PORTABLE CHEST 1 VIEW COMPARISON:  12/28/2017 FINDINGS: Cardiac shadow is stable. Postsurgical changes are seen. Aortic calcifications are noted. Left subclavian arterial stent is seen. The overall inspiratory effort is poor with crowding of the vascular markings. No focal infiltrate or sizable effusion is seen. No bony abnormality is noted. IMPRESSION: Poor inspiratory effort without acute abnormality. Electronically Signed   By: Alcide Clever Sonya.D.   On: 07/16/2019 23:08    Procedures Procedures (including critical care time)  Medications Ordered in ED Medications  sodium chloride 0.9 % bolus 500 mL (has no administration in time range)    ED Course  I have reviewed the triage vital signs and the nursing notes.  Pertinent labs & imaging results that were available during my care of the patient were reviewed by me and considered in my medical decision making (see chart for details).    MDM Rules/Calculators/A&P                           [place] Sonya  Haddox is a 85 y.o. female with a past medical history significant for  hypertension, diabetes, CAD status post CABG, prior small bowel obstruction, and CKD who presents with altered mental status and hypoxia.  Patient is only saying "uh huh" when asked any questions.  She is not able to say if she is having any complaints.  Patient was found have oxygen saturations in the mid 80s on arrival on room air.  It does not appear she is on oxygen at home.  Initially, family was going to come back but as she felt warm to the touch and was hypoxic, we will test her for coronavirus before family will be allowed in the room.  When I tried to find the family to speak with them in the waiting room, they had left thus we have no history aside from the hypoxia and EMS report that she has had generalized aches and weakness.  On exam, patient's lungs are coarse bilaterally.  Chest and abdomen do not appear to be tender.  Abdomen does not appear distended.  She is not grimacing when I push on her abdomen.  She was able to move both of her arms and was moving both of her legs.  She had symmetric pupils with normal extraocular movements.  Symmetric smile.  Patient is following some commands.  EKG shows no STEMI.  Clinically patient will likely need admission as she is hypoxic.  Although she has history of SBO, her abdomen was nondistended and nontender, do not feel she has a small bowel obstruction.  Given her coarse breath sounds, warm to the touch, and hypoxia, will get chest x-ray and labs.  CT head will be obtained due to her inability to communicate successfully.  Unclear timeline for patient's difficulty speaking.  Anticipate admission for the hypoxia and further work-up of altered mental status.  \12:02 AM Patient's husband arrived and was able to provide further information.  He reports that this is "the same way" she acted when she had her last bowel obstruction.  He thinks this is her third bowel obstruction.  He reports that she had nausea and vomits morning when she got up and has  been acting abnormally since.  He reports that when she is having the bowel suction she does not communicate well or speak.  He reports he did have a bowel movement earlier this evening and it did not look abnormal.  He thought she was having abdominal pain today although he agrees her abdomen does not seem distended.  He denies she has had fevers or chills or significant cough.  She did not tell him of any urinary symptoms by his report.  Due to the patient's hypoxia, I do feel she need admission however given her history of bowel obstruction with similar presentation with the nausea and vomiting, will get CT abdomen pelvis to further evaluate.  Care transferred to oncoming team while awaiting results of CT scan and other labs and admission.    Final Clinical Impression(s) / ED Diagnoses Final diagnoses:  Hypoxia  Fatigue, unspecified type     Clinical Impression: 1. Hypoxia   2. Fatigue, unspecified type     Disposition: Awaiting results of diagnostic work-up however given her new hypoxia and altered mental status, anticipate admission.  This note was prepared with assistance of Systems analyst. Occasional wrong-word or sound-a-like substitutions may have occurred due to the inherent limitations of voice recognition software.     Sonya Chavez, Gwenyth Allegra, MD 07/17/19 [phone]

## 2019-07-16 NOTE — ED Notes (Signed)
Attempted to call patients emergency contact to transfer to Dr. Sherry Ruffing, no answer

## 2019-07-17 ENCOUNTER — Inpatient Hospital Stay (HOSPITAL_COMMUNITY): Payer: Medicare Other

## 2019-07-17 ENCOUNTER — Emergency Department (HOSPITAL_COMMUNITY): Payer: Medicare Other

## 2019-07-17 ENCOUNTER — Other Ambulatory Visit (HOSPITAL_COMMUNITY): Payer: Medicare Other

## 2019-07-17 DIAGNOSIS — Z951 Presence of aortocoronary bypass graft: Secondary | ICD-10-CM | POA: Diagnosis not present

## 2019-07-17 DIAGNOSIS — E1151 Type 2 diabetes mellitus with diabetic peripheral angiopathy without gangrene: Secondary | ICD-10-CM | POA: Diagnosis present

## 2019-07-17 DIAGNOSIS — E1142 Type 2 diabetes mellitus with diabetic polyneuropathy: Secondary | ICD-10-CM | POA: Diagnosis present

## 2019-07-17 DIAGNOSIS — E870 Hyperosmolality and hypernatremia: Secondary | ICD-10-CM | POA: Diagnosis present

## 2019-07-17 DIAGNOSIS — K56609 Unspecified intestinal obstruction, unspecified as to partial versus complete obstruction: Secondary | ICD-10-CM | POA: Diagnosis present

## 2019-07-17 DIAGNOSIS — R778 Other specified abnormalities of plasma proteins: Secondary | ICD-10-CM | POA: Diagnosis present

## 2019-07-17 DIAGNOSIS — I129 Hypertensive chronic kidney disease with stage 1 through stage 4 chronic kidney disease, or unspecified chronic kidney disease: Secondary | ICD-10-CM | POA: Diagnosis present

## 2019-07-17 DIAGNOSIS — R4182 Altered mental status, unspecified: Secondary | ICD-10-CM | POA: Diagnosis not present

## 2019-07-17 DIAGNOSIS — E119 Type 2 diabetes mellitus without complications: Secondary | ICD-10-CM | POA: Diagnosis not present

## 2019-07-17 DIAGNOSIS — R0902 Hypoxemia: Secondary | ICD-10-CM | POA: Diagnosis present

## 2019-07-17 DIAGNOSIS — E1122 Type 2 diabetes mellitus with diabetic chronic kidney disease: Secondary | ICD-10-CM | POA: Diagnosis present

## 2019-07-17 DIAGNOSIS — Z7983 Long term (current) use of bisphosphonates: Secondary | ICD-10-CM | POA: Diagnosis not present

## 2019-07-17 DIAGNOSIS — K5669 Other partial intestinal obstruction: Secondary | ICD-10-CM | POA: Diagnosis not present

## 2019-07-17 DIAGNOSIS — I252 Old myocardial infarction: Secondary | ICD-10-CM | POA: Diagnosis not present

## 2019-07-17 DIAGNOSIS — D649 Anemia, unspecified: Secondary | ICD-10-CM

## 2019-07-17 DIAGNOSIS — Z4682 Encounter for fitting and adjustment of non-vascular catheter: Secondary | ICD-10-CM | POA: Diagnosis not present

## 2019-07-17 DIAGNOSIS — N183 Chronic kidney disease, stage 3 unspecified: Secondary | ICD-10-CM | POA: Diagnosis not present

## 2019-07-17 DIAGNOSIS — Z20828 Contact with and (suspected) exposure to other viral communicable diseases: Secondary | ICD-10-CM | POA: Diagnosis present

## 2019-07-17 DIAGNOSIS — R7989 Other specified abnormal findings of blood chemistry: Secondary | ICD-10-CM | POA: Diagnosis present

## 2019-07-17 DIAGNOSIS — E876 Hypokalemia: Secondary | ICD-10-CM | POA: Diagnosis present

## 2019-07-17 DIAGNOSIS — E785 Hyperlipidemia, unspecified: Secondary | ICD-10-CM | POA: Diagnosis present

## 2019-07-17 DIAGNOSIS — R9082 White matter disease, unspecified: Secondary | ICD-10-CM | POA: Diagnosis not present

## 2019-07-17 DIAGNOSIS — N1831 Chronic kidney disease, stage 3a: Secondary | ICD-10-CM | POA: Diagnosis present

## 2019-07-17 DIAGNOSIS — F05 Delirium due to known physiological condition: Secondary | ICD-10-CM | POA: Diagnosis present

## 2019-07-17 DIAGNOSIS — N390 Urinary tract infection, site not specified: Secondary | ICD-10-CM | POA: Diagnosis present

## 2019-07-17 DIAGNOSIS — B961 Klebsiella pneumoniae [K. pneumoniae] as the cause of diseases classified elsewhere: Secondary | ICD-10-CM | POA: Diagnosis present

## 2019-07-17 DIAGNOSIS — I2510 Atherosclerotic heart disease of native coronary artery without angina pectoris: Secondary | ICD-10-CM | POA: Diagnosis present

## 2019-07-17 DIAGNOSIS — K819 Cholecystitis, unspecified: Secondary | ICD-10-CM | POA: Diagnosis not present

## 2019-07-17 DIAGNOSIS — Z7982 Long term (current) use of aspirin: Secondary | ICD-10-CM | POA: Diagnosis not present

## 2019-07-17 DIAGNOSIS — I361 Nonrheumatic tricuspid (valve) insufficiency: Secondary | ICD-10-CM | POA: Diagnosis not present

## 2019-07-17 DIAGNOSIS — K8020 Calculus of gallbladder without cholecystitis without obstruction: Secondary | ICD-10-CM | POA: Diagnosis present

## 2019-07-17 LAB — GLUCOSE, CAPILLARY
Glucose-Capillary: 104 mg/dL — ABNORMAL HIGH (ref 70–99)
Glucose-Capillary: 112 mg/dL — ABNORMAL HIGH (ref 70–99)
Glucose-Capillary: 152 mg/dL — ABNORMAL HIGH (ref 70–99)

## 2019-07-17 LAB — RAPID URINE DRUG SCREEN, HOSP PERFORMED
Amphetamines: NOT DETECTED
Barbiturates: NOT DETECTED
Benzodiazepines: NOT DETECTED
Cocaine: NOT DETECTED
Opiates: NOT DETECTED
Tetrahydrocannabinol: NOT DETECTED

## 2019-07-17 LAB — BRAIN NATRIURETIC PEPTIDE: B Natriuretic Peptide: 278.7 pg/mL — ABNORMAL HIGH (ref 0.0–100.0)

## 2019-07-17 LAB — CBG MONITORING, ED
Glucose-Capillary: 234 mg/dL — ABNORMAL HIGH (ref 70–99)
Glucose-Capillary: 123 mg/dL — ABNORMAL HIGH (ref 70–99)

## 2019-07-17 LAB — TROPONIN I (HIGH SENSITIVITY): Troponin I (High Sensitivity): 6 ng/L (ref <18)

## 2019-07-17 LAB — SARS CORONAVIRUS 2 (TAT 6-24 HRS): SARS Coronavirus 2: NEGATIVE

## 2019-07-17 LAB — D-DIMER, QUANTITATIVE: D-Dimer, Quant: >20 ug/mL-FEU — ABNORMAL HIGH (ref 0.00–0.50)

## 2019-07-17 MED ORDER — IOHEXOL 350 MG/ML SOLN: 80.0000 mL | Freq: Once | INTRAVENOUS | Status: DC | PRN

## 2019-07-17 MED ORDER — SODIUM CHLORIDE 0.9 % IV SOLN
1.0000 g | Freq: Once | INTRAVENOUS | Status: AC
  Administered 2019-07-17: 1 g via INTRAVENOUS
  Filled 2019-07-17: qty 10

## 2019-07-17 MED ORDER — SODIUM CHLORIDE 0.9 % IV SOLN
1.0000 g | INTRAVENOUS | Status: DC
  Administered 2019-07-18 – 2019-07-19 (×2): 1 g via INTRAVENOUS
  Filled 2019-07-17 (×2): qty 1

## 2019-07-17 MED ORDER — ONDANSETRON HCL 4 MG/2ML IJ SOLN: 4.0000 mg | Freq: Four times a day (QID) | INTRAMUSCULAR | Status: DC | PRN

## 2019-07-17 MED ORDER — INSULIN ASPART 100 UNIT/ML ~~LOC~~ SOLN
0.0000 [IU] | SUBCUTANEOUS | Status: DC
  Administered 2019-07-17: 3 [IU] via SUBCUTANEOUS
  Administered 2019-07-17: 2 [IU] via SUBCUTANEOUS
  Administered 2019-07-17 – 2019-07-18 (×2): 1 [IU] via SUBCUTANEOUS
  Administered 2019-07-18 (×3): 2 [IU] via SUBCUTANEOUS
  Administered 2019-07-19 (×2): 1 [IU] via SUBCUTANEOUS
  Administered 2019-07-19: 3 [IU] via SUBCUTANEOUS
  Administered 2019-07-19: 1 [IU] via SUBCUTANEOUS

## 2019-07-17 MED ORDER — LIDOCAINE HCL URETHRAL/MUCOSAL 2 % EX GEL
1.0000 "application " | Freq: Once | CUTANEOUS | Status: AC
  Administered 2019-07-17: 1 via TOPICAL
  Filled 2019-07-17: qty 20

## 2019-07-17 MED ORDER — IOHEXOL 350 MG/ML SOLN
80.0000 mL | Freq: Once | INTRAVENOUS | Status: AC | PRN
  Administered 2019-07-17: 80 mL via INTRAVENOUS

## 2019-07-17 MED ORDER — ASPIRIN EC 81 MG PO TBEC
81.0000 mg | DELAYED_RELEASE_TABLET | Freq: Every day | ORAL | Status: DC
  Administered 2019-07-19: 81 mg via ORAL
  Filled 2019-07-17: qty 1

## 2019-07-17 MED ORDER — ACETAMINOPHEN 325 MG PO TABS: 650.0000 mg | ORAL_TABLET | Freq: Four times a day (QID) | ORAL | Status: DC | PRN

## 2019-07-17 MED ORDER — SODIUM CHLORIDE 0.9 % IV SOLN
INTRAVENOUS | Status: AC
  Administered 2019-07-17 – 2019-07-18 (×2): via INTRAVENOUS

## 2019-07-17 MED ORDER — PRAVASTATIN SODIUM 40 MG PO TABS: 40.0000 mg | ORAL_TABLET | Freq: Every day | ORAL | Status: DC

## 2019-07-17 MED ORDER — SODIUM CHLORIDE 0.9 % IV SOLN
INTRAVENOUS | Status: DC
  Administered 2019-07-17: 03:00:00 via INTRAVENOUS

## 2019-07-17 MED ORDER — DIATRIZOATE MEGLUMINE & SODIUM 66-10 % PO SOLN
90.0000 mL | Freq: Once | ORAL | Status: AC
  Administered 2019-07-17: 90 mL via NASOGASTRIC
  Filled 2019-07-17: qty 90

## 2019-07-17 MED ORDER — ACETAMINOPHEN 650 MG RE SUPP: 650.0000 mg | Freq: Four times a day (QID) | RECTAL | Status: DC | PRN

## 2019-07-17 MED ORDER — LORAZEPAM 2 MG/ML IJ SOLN
0.5000 mg | Freq: Once | INTRAMUSCULAR | Status: AC
  Administered 2019-07-17: 0.5 mg via INTRAVENOUS
  Filled 2019-07-17: qty 1

## 2019-07-17 MED ORDER — METOPROLOL TARTRATE 5 MG/5ML IV SOLN
2.5000 mg | Freq: Two times a day (BID) | INTRAVENOUS | Status: DC
  Administered 2019-07-17 – 2019-07-18 (×4): 2.5 mg via INTRAVENOUS
  Filled 2019-07-17 (×4): qty 5

## 2019-07-17 MED ORDER — HYDROMORPHONE HCL 1 MG/ML IJ SOLN
0.5000 mg | Freq: Four times a day (QID) | INTRAMUSCULAR | Status: DC | PRN
  Administered 2019-07-17: 0.5 mg via INTRAVENOUS
  Filled 2019-07-17: qty 1

## 2019-07-17 MED ORDER — METRONIDAZOLE IN NACL 5-0.79 MG/ML-% IV SOLN
500.0000 mg | Freq: Once | INTRAVENOUS | Status: AC
  Administered 2019-07-17: 500 mg via INTRAVENOUS
  Filled 2019-07-17: qty 100

## 2019-07-17 MED ORDER — SODIUM CHLORIDE 0.9 % IV BOLUS
500.0000 mL | Freq: Once | INTRAVENOUS | Status: AC
  Administered 2019-07-17: 500 mL via INTRAVENOUS

## 2019-07-17 MED ORDER — HYDRALAZINE HCL 20 MG/ML IJ SOLN: 5.0000 mg | Freq: Four times a day (QID) | INTRAMUSCULAR | Status: DC | PRN

## 2019-07-17 MED ORDER — ENOXAPARIN SODIUM 30 MG/0.3ML ~~LOC~~ SOLN
30.0000 mg | SUBCUTANEOUS | Status: DC
  Administered 2019-07-17 – 2019-07-19 (×3): 30 mg via SUBCUTANEOUS
  Filled 2019-07-17 (×3): qty 0.3

## 2019-07-17 MED ORDER — HYDRALAZINE HCL 20 MG/ML IJ SOLN
10.0000 mg | Freq: Three times a day (TID) | INTRAMUSCULAR | Status: DC
  Administered 2019-07-17 – 2019-07-19 (×6): 10 mg via INTRAVENOUS
  Filled 2019-07-17 (×6): qty 1

## 2019-07-17 NOTE — ED Notes (Signed)
Hydralazine 10mg  was held for Low BP

## 2019-07-17 NOTE — Consult Note (Signed)
Reason for Consult:abd pain Referring Physician: Dr Kim  [name] is an 85 y.o. female.  HPI: 74 yof with mmp who presents with abd pain and n/v. Her husband provides all the information. She is not really communicative now. She has been confused and agitated since this started yesterday.  He states she had a bm yesterday before coming to hospital but doesn't know more about flatus/stool.  No blood noted.  She has undergone elap/sbr/appy in 2018 by Dr Ninfa Linden here.  Her husband doesn't remember more surgeries.  She was admitted in 2019 and 2020 with what looked to be an sbo. She has known abnormal gb on imaging with large stone. This is really unchanged and from discussing with husband I dont think this is symptomatic.  She underwent ct here that shows what appears to be an sbo    Past Medical History:  Diagnosis Date  . Arthritis    "qwhere"  . Coronary artery disease   . Hypercholesteremia   . Hypertension   . Myocardial infarction ([place]) 2003  . SBO (small bowel obstruction) ([place]) 12/18/2016  . Stenosis of subclavian artery ([place])   . Type II diabetes mellitus ([place])     Past Surgical History:  Procedure Laterality Date  . ABDOMINAL HYSTERECTOMY  1970  . BOWEL RESECTION N/A 12/20/2016   Procedure: SMALL BOWEL RESECTION;  Surgeon: Coralie Keens, MD;  Location: [facility];  Service: General;  Laterality: N/A;  . CARDIAC CATHETERIZATION    . CATARACT EXTRACTION Bilateral 2000's  . CORONARY ARTERY BYPASS GRAFT  2003   in [place], [place]  . LAPAROTOMY N/A 12/20/2016   Procedure: EXPLORATORY LAPAROTOMY;  Surgeon: Coralie Keens, MD;  Location: [facility];  Service: General;  Laterality: N/A;  . LEFT HEART CATHETERIZATION WITH CORONARY/GRAFT ANGIOGRAM N/A 09/27/2014   Procedure: LEFT HEART CATHETERIZATION WITH Beatrix Fetters;  Surgeon: Laverda Page, MD;  Location: [facility] CATH LAB;  Service: Cardiovascular;  Laterality: N/A;  . UNILATERAL UPPER EXTREMEITY ANGIOGRAM N/A  09/27/2014   Procedure: SUBCLAVIAN ARTERIOGRAM ;  Surgeon: Laverda Page, MD;  Location: [facility] CATH LAB;  Service: Cardiovascular;  Laterality: N/A;  . UNILATERAL UPPER EXTREMEITY ANGIOGRAM N/A 11/09/2014   Procedure: Left subclavian stent ;  Surgeon: Adrian Prows, MD;  Location: [facility] CATH LAB;  Service: Cardiovascular;  Laterality: N/A;  . VISCERAL ANGIOGRAM N/A 10/25/2014   Procedure: Blanchard Kelch;  Surgeon: Adrian Prows, MD;  Location: [facility] CATH LAB;  Service: Cardiovascular;  Laterality: N/A;    Family History  Problem Relation Age of Onset  . Gallbladder disease Neg Hx     Social History:  reports that she has never smoked. She has never used smokeless tobacco. She reports that she does not drink alcohol or use drugs.  Allergies:  Allergies  Allergen Reactions  . Statins Other (See Comments)    myalgia myalgias myalgias Myalgias   . Crestor [Rosuvastatin] Other (See Comments)    Myalgias  . Lipitor [Atorvastatin] Other (See Comments)    myalgias  . Zocor [Simvastatin] Other (See Comments)    myalgias    Medications: I have reviewed the patient's current medications.  Results for orders placed or performed during the hospital encounter of 07/16/19 (from the past 48 hour(s))  CBC with Differential     Status: Abnormal   Collection Time: 07/16/19 10:29 PM  Result Value Ref Range   WBC 18.2 (H) 4.0 - 10.5 K/uL   RBC 4.35 3.87 - 5.11 MIL/uL   Hemoglobin 11.3 (L) 12.0 -  15.0 g/dL   HCT 36.4 36.0 - 46.0 %   MCV 83.7 80.0 - 100.0 fL   MCH 26.0 26.0 - 34.0 pg   MCHC 31.0 30.0 - 36.0 g/dL   RDW 15.8 (H) 11.5 - 15.5 %   Platelets 173 150 - 400 K/uL   nRBC 0.0 0.0 - 0.2 %   Neutrophils Relative % 90 %   Neutro Abs 16.2 (H) 1.7 - 7.7 K/uL   Lymphocytes Relative 4 %   Lymphs Abs 0.8 0.7 - 4.0 K/uL   Monocytes Relative 6 %   Monocytes Absolute 1.1 (H) 0.1 - 1.0 K/uL   Eosinophils Relative 0 %   Eosinophils Absolute 0.0 0.0 - 0.5 K/uL   Basophils Relative 0 %   Basophils  Absolute 0.0 0.0 - 0.1 K/uL   Immature Granulocytes 0 %   Abs Immature Granulocytes 0.08 (H) 0.00 - 0.07 K/uL    Comment: Performed at [facility] [address] [place], [place] [address]  Comprehensive metabolic panel     Status: Abnormal   Collection Time: 07/16/19 10:29 PM  Result Value Ref Range   Sodium 144 135 - 145 mmol/L   Potassium 3.8 3.5 - 5.1 mmol/L   Chloride 102 98 - 111 mmol/L   CO2 27 22 - 32 mmol/L   Glucose, Bld 284 (H) 70 - 99 mg/dL   BUN 42 (H) 8 - 23 mg/dL   Creatinine, Ser 1.61 (H) 0.44 - 1.00 mg/dL   Calcium 9.2 8.9 - 10.3 mg/dL   Total Protein 7.3 6.5 - 8.1 g/dL   Albumin 4.0 3.5 - 5.0 g/dL   AST 16 15 - 41 U/L   ALT 10 0 - 44 U/L   Alkaline Phosphatase 67 38 - 126 U/L   Total Bilirubin 1.0 0.3 - 1.2 mg/dL   GFR calc non Af Amer 28 (L) >60 mL/min   GFR calc Af Amer 32 (L) >60 mL/min   Anion gap 15 5 - 15    Comment: Performed at [facility] [address] [place], [place] [address]  Ammonia     Status: None   Collection Time: 07/16/19 10:29 PM  Result Value Ref Range   Ammonia 14 9 - 35 umol/L    Comment: Performed at [facility], [place] [address] [place], [place] [address]  Protime-INR     Status: None   Collection Time: 07/16/19 10:29 PM  Result Value Ref Range   Prothrombin Time 14.0 11.4 - 15.2 seconds   INR 1.1 0.8 - 1.2    Comment: (NOTE) INR goal varies based on device and disease states. Performed at [facility], [place] [address], [place], [place] [address]   Troponin I (High Sensitivity)     Status: Abnormal   Collection Time: 07/16/19 10:29 PM  Result Value Ref Range   Troponin I (High Sensitivity) 36 (H) <18 ng/L    Comment: (NOTE) Elevated high sensitivity troponin I (hsTnI) values and significant  changes across serial measurements may suggest ACS but many other  chronic and acute conditions are known to elevate hsTnI results.  Refer to the "Links" section for chest pain algorithms and additional    guidance. Performed at [facility], [place] [address] [place], [place] [address]   Lipase, blood     Status: None   Collection Time: 07/16/19 10:29 PM  Result Value Ref Range   Lipase 25 11 - 51 U/L  Comment: Performed at [facility], [place] [address], [place], [place] [address]  Urinalysis, Routine w reflex microscopic     Status: Abnormal   Collection Time: 07/16/19 10:30 PM  Result Value Ref Range   Color, Urine YELLOW YELLOW   APPearance HAZY (A) CLEAR   Specific Gravity, Urine 1.017 1.005 - 1.030   pH 7.0 5.0 - 8.0   Glucose, UA 150 (A) NEGATIVE mg/dL   Hgb urine dipstick NEGATIVE NEGATIVE   Bilirubin Urine NEGATIVE NEGATIVE   Ketones, ur NEGATIVE NEGATIVE mg/dL   Protein, ur 100 (A) NEGATIVE mg/dL   Nitrite NEGATIVE NEGATIVE   Leukocytes,Ua LARGE (A) NEGATIVE   RBC / HPF 0-5 0 - 5 RBC/hpf   WBC, UA 11-20 0 - 5 WBC/hpf   Bacteria, UA RARE (A) NONE SEEN   Squamous Epithelial / LPF 11-20 0 - 5   Non Squamous Epithelial 0-5 (A) NONE SEEN    Comment: Performed at [facility], [place] [address] [place], [place] [address]  Lactic acid, plasma     Status: None   Collection Time: 07/16/19 10:31 PM  Result Value Ref Range   Lactic Acid, Venous 1.7 0.5 - 1.9 mmol/L    Comment: Performed at [facility] [address], [place], [place] [address]  TSH     Status: None   Collection Time: 07/16/19 10:40 PM  Result Value Ref Range   TSH 3.156 0.350 - 4.500 uIU/mL    Comment: Performed by a 3rd Generation assay with a functional sensitivity of <=0.01 uIU/mL. Performed at [facility], [place] [address], [place], [place] [address]   Brain natriuretic peptide     Status: Abnormal   Collection Time: 07/16/19 10:40 PM  Result Value Ref Range   B Natriuretic Peptide 278.7 (H) 0.0 - 100.0 pg/mL    Comment: Performed at [facility] [address], [place], [place] [address]  POC SARS Coronavirus 2 Ag-ED - Nasal Swab (BD Veritor Kit)     Status: None    Collection Time: 07/16/19 11:15 PM  Result Value Ref Range   SARS Coronavirus 2 Ag NEGATIVE NEGATIVE    Comment: (NOTE) SARS-CoV-2 antigen NOT DETECTED.  Negative results are presumptive.  Negative results do not preclude SARS-CoV-2 infection and should not be used as the sole basis for treatment or other patient management decisions, including infection  control decisions, particularly in the presence of clinical signs and  symptoms consistent with COVID-19, or in those who have been in contact with the virus.  Negative results must be combined with clinical observations, patient history, and epidemiological information. The expected result is Negative. Fact Sheet for Patients: PodPark.tn Fact Sheet for Healthcare Providers: GiftContent.is This test is not yet approved or cleared by the [place] FDA and  has been authorized for detection and/or diagnosis of SARS-CoV-2 by FDA under an Emergency Use Authorization (EUA).  This EUA will remain in effect (meaning this test can be used) for the duration of  the COVID-19 de claration under Section 564(b)(1) of the Act, 21 U.S.C. section 360bbb-3(b)(1), unless the authorization is terminated or revoked sooner.   SARS CORONAVIRUS 2 (TAT 6-24 HRS) Nasopharyngeal Nasopharyngeal Swab     Status: None   Collection Time: 07/16/19 11:20 PM   Specimen: Nasopharyngeal Swab  Result Value Ref Range   SARS Coronavirus 2 NEGATIVE NEGATIVE    Comment: (NOTE) SARS-CoV-2 target nucleic acids are NOT DETECTED. The SARS-CoV-2 RNA is generally  detectable in upper and lower respiratory specimens during the acute phase of infection. Negative results do not preclude SARS-CoV-2 infection, do not rule out co-infections with other pathogens, and should not be used as the sole basis for treatment or other patient management decisions. Negative results must be combined with clinical  observations, patient history, and epidemiological information. The expected result is Negative. Fact Sheet for Patients: SugarRoll.be Fact Sheet for Healthcare Providers: https://www.woods-mathews.com/ This test is not yet approved or cleared by the [place] FDA and  has been authorized for detection and/or diagnosis of SARS-CoV-2 by FDA under an Emergency Use Authorization (EUA). This EUA will remain  in effect (meaning this test can be used) for the duration of the COVID-19 declaration under Section 56 4(b)(1) of the Act, 21 U.S.C. section 360bbb-3(b)(1), unless the authorization is terminated or revoked sooner. Performed at [facility], [place] [address] [place], [place] [address]   D-dimer, quantitative (not at [facility])     Status: Abnormal   Collection Time: 07/16/19 11:31 PM  Result Value Ref Range   D-Dimer, Quant >20.00 (H) 0.00 - 0.50 ug/mL-FEU    Comment: REPEATED TO VERIFY (NOTE) At the manufacturer cut-off of 0.50 ug/mL FEU, this assay has been documented to exclude PE with a sensitivity and negative predictive value of 97 to 99%.  At this time, this assay has not been approved by the FDA to exclude DVT/VTE. Results should be correlated with clinical presentation. Performed at [facility], [place] [address], [place], [place] [address]   Urine rapid drug screen (hosp performed)not at [facility]     Status: None   Collection Time: 07/16/19 11:31 PM  Result Value Ref Range   Opiates NONE DETECTED NONE DETECTED   Cocaine NONE DETECTED NONE DETECTED   Benzodiazepines NONE DETECTED NONE DETECTED   Amphetamines NONE DETECTED NONE DETECTED   Tetrahydrocannabinol NONE DETECTED NONE DETECTED   Barbiturates NONE DETECTED NONE DETECTED    Comment: (NOTE) DRUG SCREEN FOR MEDICAL PURPOSES ONLY.  IF CONFIRMATION IS NEEDED FOR ANY PURPOSE, NOTIFY LAB WITHIN 5 DAYS. LOWEST DETECTABLE LIMITS FOR URINE DRUG SCREEN Drug Class                      Cutoff (ng/mL) Amphetamine and metabolites    1000 Barbiturate and metabolites    200 Benzodiazepine                 158 Tricyclics and metabolites     300 Opiates and metabolites        300 Cocaine and metabolites        300 THC                            50 Performed at [facility], [place] [address], [place], [place] [address]   Troponin I (High Sensitivity)     Status: None   Collection Time: 07/17/19  1:54 AM  Result Value Ref Range   Troponin I (High Sensitivity) 6 <18 ng/L    Comment: (NOTE) Elevated high sensitivity troponin I (hsTnI) values and significant  changes across serial measurements may suggest ACS but many other  chronic and acute conditions are known to elevate hsTnI results.  Refer to the Links section for chest pain algorithms and additional  guidance. Performed at [facility], [place] [address] [place], [place] [address]     CT Head Wo Contrast  Result Date: 07/17/2019 CLINICAL DATA:  Encephalopathy EXAM: CT HEAD WITHOUT CONTRAST TECHNIQUE: Contiguous axial images were obtained from the base of the skull through the vertex without intravenous contrast. COMPARISON:  Head CT 12/03/2018 FINDINGS: Brain: There is no mass, hemorrhage or extra-axial collection. The size and configuration of the ventricles and extra-axial CSF spaces are normal. There is hypoattenuation of the white matter, most commonly indicating chronic small vessel disease. Vascular: Atherosclerotic calcification of the internal carotid arteries at the skull base. No abnormal hyperdensity of the major intracranial arteries or dural venous sinuses. Skull: The visualized skull base, calvarium and extracranial soft tissues are normal. Sinuses/Orbits: No fluid levels or advanced mucosal thickening of the visualized paranasal sinuses. No mastoid or middle ear effusion. The orbits are normal. IMPRESSION: Findings of chronic small vessel disease without acute intracranial abnormality.  Electronically Signed   By: Ulyses Jarred M.D.   On: 07/17/2019 04:56   CT Angio Chest PE W and/or Wo Contrast  Result Date: 07/17/2019 CLINICAL DATA:  Generalized body aches, weakness and hypoxia EXAM: CT ANGIOGRAPHY CHEST CT ABDOMEN AND PELVIS WITH CONTRAST TECHNIQUE: Multidetector CT imaging of the chest was performed using the standard protocol during bolus administration of intravenous contrast. Multiplanar CT image reconstructions and MIPs were obtained to evaluate the vascular anatomy. Multidetector CT imaging of the abdomen and pelvis was performed using the standard protocol during bolus administration of intravenous contrast. CONTRAST:  32m OMNIPAQUE IOHEXOL 350 MG/ML SOLN COMPARISON:  None. FINDINGS: CTA CHEST FINDINGS Cardiovascular: Contrast injection is sufficient to demonstrate satisfactory opacification of the pulmonary arteries to the segmental level. There is no pulmonary embolus. The main pulmonary artery is within normal limits for size. There is no CT evidence of acute right heart strain. There is calcific atherosclerosis of the aorta. There is a stent in the proximal left subclavian artery. There is a normal 3-vessel arch branching pattern. Heart size is normal, without pericardial effusion. Mediastinum/Nodes: The esophagus is patulous and fluid-filled. There is no mediastinal or axillary lymphadenopathy. Lungs/Pleura: Small left pleural effusion with basilar atelectasis. Left apical scarring. Musculoskeletal: Remote median sternotomy no chest wall abnormality. Review of the MIP images confirms the above findings. CT ABDOMEN and PELVIS FINDINGS Hepatobiliary: Normal hepatic contours and density. No visible biliary dilatation. Unchanged appearance of dilated and thickened gallbladder with large complex gallstone. Pancreas: Normal contours without ductal dilatation. No peripancreatic fluid collection. Spleen: Hypodense focus of the medial spleen is unchanged. Adrenals/Urinary Tract:  --Adrenal glands: Normal. --Right kidney/ureter: No hydronephrosis or perinephric stranding. No nephrolithiasis. No obstructing ureteral stones. --Left kidney/ureter: No hydronephrosis or perinephric stranding. No nephrolithiasis. No obstructing ureteral stones. --Urinary bladder: Unremarkable. Stomach/Bowel: --Stomach/Duodenum: Stomach is distended. There is marked dilatation of the duodenum, similar to the prior examination. --Small bowel: There is dilatation of the proximal small bowel which is fluid filled and quite similar in configuration to the study from 12/29/2018. There is no clear transition point but the ileum is decompressed. --Colon: There is rectosigmoid diverticulosis without acute inflammation. --Appendix: Not visualized Vascular/Lymphatic: Atherosclerotic calcification is present within the non-aneurysmal abdominal aorta, without hemodynamically significant stenosis. No abdominal or pelvic lymphadenopathy. Reproductive: Status post hysterectomy. No adnexal mass. Musculoskeletal. Grade 1 L4-5 anterolisthesis with right L4 pars interarticularis defect. Other: None. IMPRESSION: 1. No pulmonary embolus or acute aortic syndrome. 2. Small bowel obstruction with markedly dilated esophagus, stomach, duodenum and jejunum. No clear transition point is identified, but the configuration of the small bowel is similar to the study of 12/29/18. 3. Small left pleural  effusion and basilar atelectasis. 4. Unchanged appearance of dilated and thickened gallbladder with large complex gallstone. 5. Aortic Atherosclerosis (ICD10-I70.0). Electronically Signed   By: Ulyses Jarred M.D.   On: 07/17/2019 05:12   CT ABDOMEN PELVIS W CONTRAST  Result Date: 07/17/2019 CLINICAL DATA:  Generalized body aches, weakness and hypoxia EXAM: CT ANGIOGRAPHY CHEST CT ABDOMEN AND PELVIS WITH CONTRAST TECHNIQUE: Multidetector CT imaging of the chest was performed using the standard protocol during bolus administration of intravenous  contrast. Multiplanar CT image reconstructions and MIPs were obtained to evaluate the vascular anatomy. Multidetector CT imaging of the abdomen and pelvis was performed using the standard protocol during bolus administration of intravenous contrast. CONTRAST:  58m OMNIPAQUE IOHEXOL 350 MG/ML SOLN COMPARISON:  None. FINDINGS: CTA CHEST FINDINGS Cardiovascular: Contrast injection is sufficient to demonstrate satisfactory opacification of the pulmonary arteries to the segmental level. There is no pulmonary embolus. The main pulmonary artery is within normal limits for size. There is no CT evidence of acute right heart strain. There is calcific atherosclerosis of the aorta. There is a stent in the proximal left subclavian artery. There is a normal 3-vessel arch branching pattern. Heart size is normal, without pericardial effusion. Mediastinum/Nodes: The esophagus is patulous and fluid-filled. There is no mediastinal or axillary lymphadenopathy. Lungs/Pleura: Small left pleural effusion with basilar atelectasis. Left apical scarring. Musculoskeletal: Remote median sternotomy no chest wall abnormality. Review of the MIP images confirms the above findings. CT ABDOMEN and PELVIS FINDINGS Hepatobiliary: Normal hepatic contours and density. No visible biliary dilatation. Unchanged appearance of dilated and thickened gallbladder with large complex gallstone. Pancreas: Normal contours without ductal dilatation. No peripancreatic fluid collection. Spleen: Hypodense focus of the medial spleen is unchanged. Adrenals/Urinary Tract: --Adrenal glands: Normal. --Right kidney/ureter: No hydronephrosis or perinephric stranding. No nephrolithiasis. No obstructing ureteral stones. --Left kidney/ureter: No hydronephrosis or perinephric stranding. No nephrolithiasis. No obstructing ureteral stones. --Urinary bladder: Unremarkable. Stomach/Bowel: --Stomach/Duodenum: Stomach is distended. There is marked dilatation of the duodenum, similar  to the prior examination. --Small bowel: There is dilatation of the proximal small bowel which is fluid filled and quite similar in configuration to the study from 12/29/2018. There is no clear transition point but the ileum is decompressed. --Colon: There is rectosigmoid diverticulosis without acute inflammation. --Appendix: Not visualized Vascular/Lymphatic: Atherosclerotic calcification is present within the non-aneurysmal abdominal aorta, without hemodynamically significant stenosis. No abdominal or pelvic lymphadenopathy. Reproductive: Status post hysterectomy. No adnexal mass. Musculoskeletal. Grade 1 L4-5 anterolisthesis with right L4 pars interarticularis defect. Other: None. IMPRESSION: 1. No pulmonary embolus or acute aortic syndrome. 2. Small bowel obstruction with markedly dilated esophagus, stomach, duodenum and jejunum. No clear transition point is identified, but the configuration of the small bowel is similar to the study of 12/29/18. 3. Small left pleural effusion and basilar atelectasis. 4. Unchanged appearance of dilated and thickened gallbladder with large complex gallstone. 5. Aortic Atherosclerosis (ICD10-I70.0). Electronically Signed   By: KUlyses JarredM.D.   On: 07/17/2019 05:12   DG Chest Portable 1 View  Result Date: 07/16/2019 CLINICAL DATA:  Generalized weakness EXAM: PORTABLE CHEST 1 VIEW COMPARISON:  12/28/2017 FINDINGS: Cardiac shadow is stable. Postsurgical changes are seen. Aortic calcifications are noted. Left subclavian arterial stent is seen. The overall inspiratory effort is poor with crowding of the vascular markings. No focal infiltrate or sizable effusion is seen. No bony abnormality is noted. IMPRESSION: Poor inspiratory effort without acute abnormality. Electronically Signed   By: MInez CatalinaM.D.   On: 07/16/2019 23:08  Review of Systems  Unable to perform ROS: Mental status change   Blood pressure 96/69, pulse 78, temperature 98.2 F (36.8 C), temperature  source Rectal, resp. rate (!) 21, SpO2 96 %. Physical Exam  Constitutional: She appears listless. She appears cachectic.  HENT:  Head: Normocephalic and atraumatic.  Eyes: No scleral icterus.  Cardiovascular: Normal rate and regular rhythm.  Respiratory: Effort normal and breath sounds normal.  GI: She exhibits no distension. Bowel sounds are decreased. There is no abdominal tenderness. No hernia.    Musculoskeletal:        General: No tenderness.     Cervical back: Neck supple.  Neurological: She appears listless. GCS eye subscore is 4. GCS verbal subscore is 5. GCS motor subscore is 6.    Assessment/Plan: SBO -appears to be sbo again clinically and radiologically, I do not think she has chronic gb issues or that this is cause of acute problem -appreciate [facility] admission and will follow with you for hopeful nonsurgical resolution -sbo protocol, ng in will write for contrast -ok for pharm dvt prophylaxis -does not need abx  Rolm Bookbinder 07/17/2019, 7:57 AM

## 2019-07-17 NOTE — Progress Notes (Signed)
[facility]  PROGRESS NOTE  [place] M Goshert ZOX:[id number] DOB: September 19, 1925 DOA: 07/16/2019 PCP: Alysia Penna, MD Admit date - 07/16/2019   Admitting Physician Pearson Grippe, MD  Outpatient Primary MD for the patient is Alysia Penna, MD  LOS - 0 Brief Narrative   Sonya Chavez is a 85 y.o. year old female with medical history significant for CAD status post CABG, HTN, type 2 diabetes, prior small bowel obstruction (hospitalized 01/2019 with concern for cholecystitis but patient declined HIDA scan , March/2020) who presented on 07/16/2019 with emesis and vomiting x1 day with associated worsening confusion reported by husband and was found to have small bowel obstruction with AMS and likely UTI.   Husband provides all relevant history, states patient has been feeling well before suddenly developing nausea and emesis episodes with worsening confusion. He brought her to the ED because he was concerned she had developed another obstruction.  He reports she had a normal bowel movement the day prior.  In the ED: T-max 98.9 blood pressure 177/68.  Lab work notable for glucose of 234, D-dimer greater than 20, BNP 278.  UA with pyuria with rare bacteria.  WBC 18.2.  Creatinine 1.61 (baseline). Chest x-ray no acute findings.  CTA obtained due to elevated D-dimer with reported hypoxia of 86% on room air in ED, it was negative for PE or any abnormal lung findings but did confirm small bowel obstruction.  NG tube was placed, surgery was consulted, patient was [facility].     Subjective  Sonya Chavez today is groaning in bed. Husband provides all history.   A & P   Small bowel obstruction.  Presume risk factor being adhesions given previous abdominal surgeries.  Per husband this is third episode.  Abdomen is nonacute, patient seems uncomfortable, emesis episodes at home, not safe for PO currently. --Continue NG tube with suction -Surgery consulted,  SBO protocol with Gastrografin -IV 0.5 mg Dilaudid every 6 hours as needed severe pain -IV Zofran as needed  Hypoactive Delirium .  In the setting of SBO and associated pain, possible UTI.  CT head negative for acute abnormalities.  TSH, ammonia,Electrolytes within normal limits per husband baseline is functional and independent of ADLs and corroborated on previous documentation from last hospital stay in 01/2019.  Not following commands on my exam but purposely moving all extremities without focal deficits. -Delirium precautions -Expect improvement with management of SBO  Elevated D-dimer.  Likely result of inflammatory marker increased from SBO.  CTA negative for PE and patient has no O2 requirements -Closely monitor  Pyuria on UA. Ua with large leukocytes, rare bacteria, 11-20 squamous epithelial,Could be contaminant given epithelial cells, very confused on presentation so can't ask about urinary symptoms --Agree with IV ceftriaxone, follow up urine culture  Leukocytosis.  Suspect stress leukocytosis in the setting of SBO, but also could be related to likely UTI.  Patient remains afebrile, chest x-ray/CT chest/abdomen with no other overt infectious process.  -Trend CBC, monitor blood cultures  Dilated and thickened gall bladder with large complex gallstone, chronic. Unchanged from 12/2018. Surgery does not think this is cause of acute problems. Patient declined HIDA scan last admission in 01/2019 LFTs/T bilirubin wnl. No visibile biliary dilatation on CT imaging, LFTs and T bilirubin wnl -received flagyl in ED, surgery recommends not continuing --closely monitor   High-sensitivity troponin.  Initial level 36--6. Not consistent with ACS given decrease in 2 hours. No symptoms and no acute ischemic changes on EKG.  -  TTE ordered on admission   CKD, stage III, stable.  Most recent creatinine was 01/2019 1.6, currently remained stable -Avoid nephrotoxins, monitor BMP, monitor urine output   Normocytic anemia, chronic.  Hemoglobin stable -Daily CBC  Hypertension, elevated, ranging 150s-170s.  In setting of not been able to tolerate p.o. patient does not go home meds here l -Holding home amlodipine, carvedilol, hydralazine giving SBO, -IV hydralazine as needed, IV hydralazine 10 mg scheduled q8 Hrs, d/c IV metoprolol  Hyperlipidemia, stable.  -- Given n.p.o. status we will continue to hold home statin therapies  Type 2 diabetes with peripheral neuropathy.  A1c. 7.5(12/2018) -Sliding scale as needed--n.p.o. protocol, monitor CBGs -Holding home gabapentin given n.p.o. status.  CAD.  History of CABG.  No obvious cardiac symptoms. -Holding home aspirin and statin given n.p.o. status. -Closely monitor.    Family Communication  : Husband updated at bedside, son updated her husband's phone  Code Status : Full code, confirmed by husband  Disposition Plan  : Close monitoring of NG tube, abdominal exam  Consults  : Surgery  Procedures  : TTE pending  DVT Prophylaxis  :  Lovenox  Lab Results  Component Value Date   PLT 173 07/16/2019    Diet :  Diet Order            Diet NPO time specified Except for: Sips with Meds  Diet effective now               Inpatient Medications Scheduled Meds: . diatrizoate meglumine-sodium  90 mL Per NG tube Once  . enoxaparin (LOVENOX) injection  30 mg Subcutaneous Q24H  . hydrALAZINE  10 mg Intravenous Q8H  . insulin aspart  0-9 Units Subcutaneous Q4H  . metoprolol tartrate  2.5 mg Intravenous BID   Continuous Infusions: . sodium chloride Stopped (07/17/19 1238)  . sodium chloride 75 mL/hr at 07/17/19 1237  . [START ON 07/18/2019] cefTRIAXone (ROCEPHIN)  IV     PRN Meds:.acetaminophen **OR** acetaminophen, hydrALAZINE, HYDROmorphone (DILAUDID) injection, ondansetron (ZOFRAN) IV  Antibiotics  :   Anti-infectives (From admission, onward)   Start     Dose/Rate Route Frequency Ordered Stop   07/18/19 0200  cefTRIAXone  (ROCEPHIN) 1 g in sodium chloride 0.9 % 100 mL IVPB     1 g 200 mL/hr over 30 Minutes Intravenous Every 24 hours 07/17/19 0636     07/17/19 0545  metroNIDAZOLE (FLAGYL) IVPB 500 mg     500 mg 100 mL/hr over 60 Minutes Intravenous  Once 07/17/19 0540 07/17/19 0710   07/17/19 0200  cefTRIAXone (ROCEPHIN) 1 g in sodium chloride 0.9 % 100 mL IVPB     1 g 200 mL/hr over 30 Minutes Intravenous  Once 07/17/19 0153 07/17/19 0350       Objective   Vitals:   07/17/19 1215 07/17/19 1245 07/17/19 1257 07/17/19 1330  BP: (!) 161/47 (!) 164/55  (!) 156/46  Pulse: 73 85 93 73  Resp:   20 18  Temp:      TempSrc:      SpO2: 98% 97% 92% 98%    SpO2: 98 % O2 Flow Rate (L/min): 2 L/min  Wt Readings from Last 3 Encounters:  01/05/19 64.3 kg  12/03/18 59 kg  12/28/17 59.4 kg     Intake/Output Summary (Last 24 hours) at 07/17/2019 1408 Last data filed at 07/17/2019 1238 Gross per 24 hour  Intake 1700 ml  Output 1100 ml  Net 600 ml    Physical Exam:  Lying  in bed, groaning Keeps eyes closed on my exam Moving all extremities on her own but not following commands NG tube in place draining bilious fluid Abdomen soft, nondistended, nontender, minimal bowel sounds No appreciable rashes Lungs clear bilaterally, on room air, normal respiratory effort Heart regular rate and rhythm, no edema    I have personally reviewed the following:   Data Reviewed:  CBC Recent Labs  Lab 07/16/19 2229  WBC 18.2*  HGB 11.3*  HCT 36.4  PLT 173  MCV 83.7  MCH 26.0  MCHC 31.0  RDW 15.8*  LYMPHSABS 0.8  MONOABS 1.1*  EOSABS 0.0  BASOSABS 0.0    Chemistries  Recent Labs  Lab 07/16/19 2229  NA 144  K 3.8  CL 102  CO2 27  GLUCOSE 284*  BUN 42*  CREATININE 1.61*  CALCIUM 9.2  AST 16  ALT 10  ALKPHOS 67  BILITOT 1.0   ------------------------------------------------------------------------------------------------------------------ No results for input(s): CHOL, HDL, LDLCALC,  TRIG, CHOLHDL, LDLDIRECT in the last 72 hours.  Lab Results  Component Value Date   HGBA1C 7.5 (H) 12/30/2018   ------------------------------------------------------------------------------------------------------------------ Recent Labs    07/16/19 2240  TSH 3.156   ------------------------------------------------------------------------------------------------------------------ No results for input(s): VITAMINB12, FOLATE, FERRITIN, TIBC, IRON, RETICCTPCT in the last 72 hours.  Coagulation profile Recent Labs  Lab 07/16/19 2229  INR 1.1    Recent Labs    07/16/19 2331  DDIMER >20.00*    Cardiac Enzymes No results for input(s): CKMB, TROPONINI, MYOGLOBIN in the last 168 hours.  Invalid input(s): CK ------------------------------------------------------------------------------------------------------------------    Component Value Date/Time   BNP 278.7 (H) 07/16/2019 2240    Micro Results Recent Results (from the past 240 hour(s))  SARS CORONAVIRUS 2 (TAT 6-24 HRS) Nasopharyngeal Nasopharyngeal Swab     Status: None   Collection Time: 07/16/19 11:20 PM   Specimen: Nasopharyngeal Swab  Result Value Ref Range Status   SARS Coronavirus 2 NEGATIVE NEGATIVE Final    Comment: (NOTE) SARS-CoV-2 target nucleic acids are NOT DETECTED. The SARS-CoV-2 RNA is generally detectable in upper and lower respiratory specimens during the acute phase of infection. Negative results do not preclude SARS-CoV-2 infection, do not rule out co-infections with other pathogens, and should not be used as the sole basis for treatment or other patient management decisions. Negative results must be combined with clinical observations, patient history, and epidemiological information. The expected result is Negative. Fact Sheet for Patients: HairSlick.no Fact Sheet for Healthcare Providers: quierodirigir.com This test is not yet approved  or cleared by the [place] FDA and  has been authorized for detection and/or diagnosis of SARS-CoV-2 by FDA under an Emergency Use Authorization (EUA). This EUA will remain  in effect (meaning this test can be used) for the duration of the COVID-19 declaration under Section 56 4(b)(1) of the Act, 21 U.S.C. section 360bbb-3(b)(1), unless the authorization is terminated or revoked sooner. Performed at [facility], [address], [place], [place] [address]     Radiology Reports CT Head Wo Contrast  Result Date: 07/17/2019 CLINICAL DATA:  Encephalopathy EXAM: CT HEAD WITHOUT CONTRAST TECHNIQUE: Contiguous axial images were obtained from the base of the skull through the vertex without intravenous contrast. COMPARISON:  Head CT 12/03/2018 FINDINGS: Brain: There is no mass, hemorrhage or extra-axial collection. The size and configuration of the ventricles and extra-axial CSF spaces are normal. There is hypoattenuation of the white matter, most commonly indicating chronic small vessel disease. Vascular: Atherosclerotic calcification of the internal carotid arteries at  the skull base. No abnormal hyperdensity of the major intracranial arteries or dural venous sinuses. Skull: The visualized skull base, calvarium and extracranial soft tissues are normal. Sinuses/Orbits: No fluid levels or advanced mucosal thickening of the visualized paranasal sinuses. No mastoid or middle ear effusion. The orbits are normal. IMPRESSION: Findings of chronic small vessel disease without acute intracranial abnormality. Electronically Signed   By: Deatra RobinsonKevin  Herman M.D.   On: 07/17/2019 04:56   CT Angio Chest PE W and/or Wo Contrast  Result Date: 07/17/2019 CLINICAL DATA:  Generalized body aches, weakness and hypoxia EXAM: CT ANGIOGRAPHY CHEST CT ABDOMEN AND PELVIS WITH CONTRAST TECHNIQUE: Multidetector CT imaging of the chest was performed using the standard protocol during bolus administration of intravenous  contrast. Multiplanar CT image reconstructions and MIPs were obtained to evaluate the vascular anatomy. Multidetector CT imaging of the abdomen and pelvis was performed using the standard protocol during bolus administration of intravenous contrast. CONTRAST:  80mL OMNIPAQUE IOHEXOL 350 MG/ML SOLN COMPARISON:  None. FINDINGS: CTA CHEST FINDINGS Cardiovascular: Contrast injection is sufficient to demonstrate satisfactory opacification of the pulmonary arteries to the segmental level. There is no pulmonary embolus. The main pulmonary artery is within normal limits for size. There is no CT evidence of acute right heart strain. There is calcific atherosclerosis of the aorta. There is a stent in the proximal left subclavian artery. There is a normal 3-vessel arch branching pattern. Heart size is normal, without pericardial effusion. Mediastinum/Nodes: The esophagus is patulous and fluid-filled. There is no mediastinal or axillary lymphadenopathy. Lungs/Pleura: Small left pleural effusion with basilar atelectasis. Left apical scarring. Musculoskeletal: Remote median sternotomy no chest wall abnormality. Review of the MIP images confirms the above findings. CT ABDOMEN and PELVIS FINDINGS Hepatobiliary: Normal hepatic contours and density. No visible biliary dilatation. Unchanged appearance of dilated and thickened gallbladder with large complex gallstone. Pancreas: Normal contours without ductal dilatation. No peripancreatic fluid collection. Spleen: Hypodense focus of the medial spleen is unchanged. Adrenals/Urinary Tract: --Adrenal glands: Normal. --Right kidney/ureter: No hydronephrosis or perinephric stranding. No nephrolithiasis. No obstructing ureteral stones. --Left kidney/ureter: No hydronephrosis or perinephric stranding. No nephrolithiasis. No obstructing ureteral stones. --Urinary bladder: Unremarkable. Stomach/Bowel: --Stomach/Duodenum: Stomach is distended. There is marked dilatation of the duodenum, similar  to the prior examination. --Small bowel: There is dilatation of the proximal small bowel which is fluid filled and quite similar in configuration to the study from 12/29/2018. There is no clear transition point but the ileum is decompressed. --Colon: There is rectosigmoid diverticulosis without acute inflammation. --Appendix: Not visualized Vascular/Lymphatic: Atherosclerotic calcification is present within the non-aneurysmal abdominal aorta, without hemodynamically significant stenosis. No abdominal or pelvic lymphadenopathy. Reproductive: Status post hysterectomy. No adnexal mass. Musculoskeletal. Grade 1 L4-5 anterolisthesis with right L4 pars interarticularis defect. Other: None. IMPRESSION: 1. No pulmonary embolus or acute aortic syndrome. 2. Small bowel obstruction with markedly dilated esophagus, stomach, duodenum and jejunum. No clear transition point is identified, but the configuration of the small bowel is similar to the study of 12/29/18. 3. Small left pleural effusion and basilar atelectasis. 4. Unchanged appearance of dilated and thickened gallbladder with large complex gallstone. 5. Aortic Atherosclerosis (ICD10-I70.0). Electronically Signed   By: Deatra RobinsonKevin  Herman M.D.   On: 07/17/2019 05:12   CT ABDOMEN PELVIS W CONTRAST  Result Date: 07/17/2019 CLINICAL DATA:  Generalized body aches, weakness and hypoxia EXAM: CT ANGIOGRAPHY CHEST CT ABDOMEN AND PELVIS WITH CONTRAST TECHNIQUE: Multidetector CT imaging of the chest was performed using the standard protocol during bolus administration of  intravenous contrast. Multiplanar CT image reconstructions and MIPs were obtained to evaluate the vascular anatomy. Multidetector CT imaging of the abdomen and pelvis was performed using the standard protocol during bolus administration of intravenous contrast. CONTRAST:  32mL OMNIPAQUE IOHEXOL 350 MG/ML SOLN COMPARISON:  None. FINDINGS: CTA CHEST FINDINGS Cardiovascular: Contrast injection is sufficient to  demonstrate satisfactory opacification of the pulmonary arteries to the segmental level. There is no pulmonary embolus. The main pulmonary artery is within normal limits for size. There is no CT evidence of acute right heart strain. There is calcific atherosclerosis of the aorta. There is a stent in the proximal left subclavian artery. There is a normal 3-vessel arch branching pattern. Heart size is normal, without pericardial effusion. Mediastinum/Nodes: The esophagus is patulous and fluid-filled. There is no mediastinal or axillary lymphadenopathy. Lungs/Pleura: Small left pleural effusion with basilar atelectasis. Left apical scarring. Musculoskeletal: Remote median sternotomy no chest wall abnormality. Review of the MIP images confirms the above findings. CT ABDOMEN and PELVIS FINDINGS Hepatobiliary: Normal hepatic contours and density. No visible biliary dilatation. Unchanged appearance of dilated and thickened gallbladder with large complex gallstone. Pancreas: Normal contours without ductal dilatation. No peripancreatic fluid collection. Spleen: Hypodense focus of the medial spleen is unchanged. Adrenals/Urinary Tract: --Adrenal glands: Normal. --Right kidney/ureter: No hydronephrosis or perinephric stranding. No nephrolithiasis. No obstructing ureteral stones. --Left kidney/ureter: No hydronephrosis or perinephric stranding. No nephrolithiasis. No obstructing ureteral stones. --Urinary bladder: Unremarkable. Stomach/Bowel: --Stomach/Duodenum: Stomach is distended. There is marked dilatation of the duodenum, similar to the prior examination. --Small bowel: There is dilatation of the proximal small bowel which is fluid filled and quite similar in configuration to the study from 12/29/2018. There is no clear transition point but the ileum is decompressed. --Colon: There is rectosigmoid diverticulosis without acute inflammation. --Appendix: Not visualized Vascular/Lymphatic: Atherosclerotic calcification is  present within the non-aneurysmal abdominal aorta, without hemodynamically significant stenosis. No abdominal or pelvic lymphadenopathy. Reproductive: Status post hysterectomy. No adnexal mass. Musculoskeletal. Grade 1 L4-5 anterolisthesis with right L4 pars interarticularis defect. Other: None. IMPRESSION: 1. No pulmonary embolus or acute aortic syndrome. 2. Small bowel obstruction with markedly dilated esophagus, stomach, duodenum and jejunum. No clear transition point is identified, but the configuration of the small bowel is similar to the study of 12/29/18. 3. Small left pleural effusion and basilar atelectasis. 4. Unchanged appearance of dilated and thickened gallbladder with large complex gallstone. 5. Aortic Atherosclerosis (ICD10-I70.0). Electronically Signed   By: Ulyses Jarred M.D.   On: 07/17/2019 05:12   DG Chest Portable 1 View  Result Date: 07/16/2019 CLINICAL DATA:  Generalized weakness EXAM: PORTABLE CHEST 1 VIEW COMPARISON:  12/28/2017 FINDINGS: Cardiac shadow is stable. Postsurgical changes are seen. Aortic calcifications are noted. Left subclavian arterial stent is seen. The overall inspiratory effort is poor with crowding of the vascular markings. No focal infiltrate or sizable effusion is seen. No bony abnormality is noted. IMPRESSION: Poor inspiratory effort without acute abnormality. Electronically Signed   By: Inez Catalina M.D.   On: 07/16/2019 23:08   DG Abd Portable 1V-Small Bowel Protocol-Position Verification  Result Date: 07/17/2019 CLINICAL DATA:  Nasogastric tube placement. EXAM: PORTABLE ABDOMEN - 1 VIEW COMPARISON:  Dec 31, 2018. FINDINGS: The bowel gas pattern is normal. Nasogastric tube tip is seen in proximal stomach. No radio-opaque calculi or other significant radiographic abnormality are seen. IMPRESSION: Nasogastric tube tip seen in proximal stomach. No evidence of bowel obstruction or ileus. Electronically Signed   By: Marijo Conception M.D.   On:  07/17/2019 09:53      Time Spent in minutes  30     Laverna Peace M.D on 07/17/2019 at 2:08 PM  To page go to [email or web address] - password [email or web address]

## 2019-07-17 NOTE — H&P (Addendum)
TRH H&P    Patient Demographics:    Sonya Chavez, is a 85 y.o. female  MRN: [id number]  DOB - 1925/08/31  Admit Date - 07/16/2019  Referring MD/NP/PA: Erasmo Downer Ward  Outpatient Primary MD for the patient is Velna Hatchet, MD  Patient coming from:  home  Chief complaint-  AMS   HPI:    Sonya Chavez  is a 84 y.o. female,  w hypertension, hyperlipidemia, Dm2, CAD s/p CABG, apparently presents with generalized achiness and altered mental status.    In ED,  T 98.4, P 81, R 20, Bp 177/69  Pox 91% on RA  CT brain  IMPRESSION: Findings of chronic small vessel disease without acute intracranial abnormality.  CTA chest/ abd/ pelvis IMPRESSION: 1. No pulmonary embolus or acute aortic syndrome. 2. Small bowel obstruction with markedly dilated esophagus, stomach, duodenum and jejunum. No clear transition point is identified, but the configuration of the small bowel is similar to the study of 12/29/18. 3. Small left pleural effusion and basilar atelectasis. 4. Unchanged appearance of dilated and thickened gallbladder with large complex gallstone. 5. Aortic Atherosclerosis (ICD10-I70.0).  Wbc 18.2, Hgb 11.3, Plt 173 Na 144, K 3.8, Bun 42, Creatinine 1.61 Glucose 284 Ast 16, Alt 10 Ammonia 14 INR 1.1 TSH 3.156 Lipase 25 BNP 278 Trop 36 -> 6 Urinalysis wbc 11-20  covid -19 negative  Blood culture x2 pending  Pt will be admitted for AMS secondary to acute lower uti, and also SBO, and elevated troponin -> normalized. , and porcelain gallbladder.     Review of systems:    In addition to the HPI above, unable to provide due to AMS No Fever-chills, No Headache, No changes with Vision or hearing, No problems swallowing food or Liquids, No Chest pain, Cough or Shortness of Breath, No Abdominal pain, No Nausea or Vomiting, bowel movements are regular, No Blood in stool or Urine, No  dysuria, No new skin rashes or bruises, No new joints pains-aches,  No new weakness, tingling, numbness in any extremity, No recent weight gain or loss, No polyuria, polydypsia or polyphagia, No significant Mental Stressors.  All other systems reviewed and are negative.    Past History of the following :    Past Medical History:  Diagnosis Date  . Arthritis    "qwhere"  . Coronary artery disease   . Hypercholesteremia   . Hypertension   . Myocardial infarction ([facility]) 2003  . SBO (small bowel obstruction) ([facility]) 12/18/2016  . Stenosis of subclavian artery ([facility])   . Type II diabetes mellitus ([place])       Past Surgical History:  Procedure Laterality Date  . ABDOMINAL HYSTERECTOMY  1970  . BOWEL RESECTION N/A 12/20/2016   Procedure: SMALL BOWEL RESECTION;  Surgeon: Coralie Keens, MD;  Location: [facility];  Service: General;  Laterality: N/A;  . CARDIAC CATHETERIZATION    . CATARACT EXTRACTION Bilateral 2000's  . CORONARY ARTERY BYPASS GRAFT  2003   in [place], [place]  . LAPAROTOMY N/A 12/20/2016   Procedure: EXPLORATORY LAPAROTOMY;  Surgeon: Ninfa Linden,  Nathaneil Canary, MD;  Location: [facility];  Service: General;  Laterality: N/A;  . LEFT HEART CATHETERIZATION WITH CORONARY/GRAFT ANGIOGRAM N/A 09/27/2014   Procedure: LEFT HEART CATHETERIZATION WITH Beatrix Fetters;  Surgeon: Laverda Page, MD;  Location[facility] CATH LAB;  Service: Cardiovascular;  Laterality: N/A;  . UNILATERAL UPPER EXTREMEITY ANGIOGRAM N/A 09/27/2014   Procedure: SUBCLAVIAN ARTERIOGRAM ;  Surgeon: Laverda Page, MD;  Location: [facility] CATH LAB;  Service: Cardiovascular;  Laterality: N/A;  . UNILATERAL UPPER EXTREMEITY ANGIOGRAM N/A 11/09/2014   Procedure: Left subclavian stent ;  Surgeon: [place] Prows, MD;  Location: [facility] CATH LAB;  Service: Cardiovascular;  Laterality: N/A;  . VISCERAL ANGIOGRAM N/A 10/25/2014   Procedure: Blanchard Kelch;  Surgeon: [place] Prows, MD;  Location: [facility] CATH LAB;  Service: Cardiovascular;   Laterality: N/A;      Social History:      Social History   Tobacco Use  . Smoking status: Never Smoker  . Smokeless tobacco: Never Used  Substance Use Topics  . Alcohol use: No       Family History :     Family History  Problem Relation Age of Onset  . Gallbladder disease Neg Hx        Home Medications:   Prior to Admission medications   Medication Sig Start Date End Date Taking? Authorizing Provider  alendronate (FOSAMAX) 70 MG tablet Take 70 mg by mouth every Monday.  12/08/14   [provider]  amLODipine (NORVASC) 10 MG tablet Take 10 mg by mouth at bedtime.  10/12/14   [provider]  aspirin EC 81 MG tablet Take 81 mg by mouth at bedtime.     [provider]  Calcium Citrate-Vitamin D (CALCIUM CITRATE + PO) Take 1 tablet by mouth daily.     [provider]  carvedilol (COREG) 6.25 MG tablet Take 12.5 mg by mouth 2 (two) times daily. 10/14/18   [provider]  gabapentin (NEURONTIN) 300 MG capsule Take 300 mg by mouth at bedtime.  04/09/17   [provider]  hydrALAZINE (APRESOLINE) 25 MG tablet Take 1 tablet (25 mg total) by mouth 3 (three) times daily. 03/03/19   [place] Prows, MD  lovastatin (MEVACOR) 40 MG tablet Take 40 mg by mouth every evening. 10/13/18   [provider]     Allergies:     Allergies  Allergen Reactions  . Statins Other (See Comments)    myalgia myalgias myalgias Myalgias   . Crestor [Rosuvastatin] Other (See Comments)    Myalgias  . Lipitor [Atorvastatin] Other (See Comments)    myalgias  . Zocor [Simvastatin] Other (See Comments)    myalgias     Physical Exam:   Vitals  Blood pressure (!) 166/87, pulse 74, temperature 98.2 F (36.8 C), temperature source Rectal, resp. rate (!) 22, SpO2 96 %.  1.  General: axoxo1 (person)  2. Psychiatric: euthymic  3. Neurologic: Nonfocal, cn2-12 intact, reflexes 2+ symmetric, diffuse with no clonus, motor 5/5 in all 4 ext  4.  HEENMT:  Anicteric, pupils 1.50m symmetric, direct, consensual, near intact NGT in place Neck: no jvd  5. Respiratory : CTAB  6. Cardiovascular : rrr s1, s2, no m/g/r  7. Gastrointestinal:  Abd: soft, nt, nd, +bs  8. Skin:  Ext: no c/c/e, no rash  9.Musculoskeletal:  Good ROM    Data Review:    CBC Recent Labs  Lab 07/16/19 2229  WBC 18.2*  HGB 11.3*  HCT 36.4  PLT 173  MCV  83.7  MCH 26.0  MCHC 31.0  RDW 15.8*  LYMPHSABS 0.8  MONOABS 1.1*  EOSABS 0.0  BASOSABS 0.0   ------------------------------------------------------------------------------------------------------------------  Results for orders placed or performed during the hospital encounter of 07/16/19 (from the past 48 hour(s))  CBC with Differential     Status: Abnormal   Collection Time: 07/16/19 10:29 PM  Result Value Ref Range   WBC 18.2 (H) 4.0 - 10.5 K/uL   RBC 4.35 3.87 - 5.11 MIL/uL   Hemoglobin 11.3 (L) 12.0 - 15.0 g/dL   HCT 36.4 36.0 - 46.0 %   MCV 83.7 80.0 - 100.0 fL   MCH 26.0 26.0 - 34.0 pg   MCHC 31.0 30.0 - 36.0 g/dL   RDW 15.8 (H) 11.5 - 15.5 %   Platelets 173 150 - 400 K/uL   nRBC 0.0 0.0 - 0.2 %   Neutrophils Relative % 90 %   Neutro Abs 16.2 (H) 1.7 - 7.7 K/uL   Lymphocytes Relative 4 %   Lymphs Abs 0.8 0.7 - 4.0 K/uL   Monocytes Relative 6 %   Monocytes Absolute 1.1 (H) 0.1 - 1.0 K/uL   Eosinophils Relative 0 %   Eosinophils Absolute 0.0 0.0 - 0.5 K/uL   Basophils Relative 0 %   Basophils Absolute 0.0 0.0 - 0.1 K/uL   Immature Granulocytes 0 %   Abs Immature Granulocytes 0.08 (H) 0.00 - 0.07 K/uL    Comment: Performed at [facility], [address] [place], [place] [address]  Comprehensive metabolic panel     Status: Abnormal   Collection Time: 07/16/19 10:29 PM  Result Value Ref Range   Sodium 144 135 - 145 mmol/L   Potassium 3.8 3.5 - 5.1 mmol/L   Chloride 102 98 - 111 mmol/L   CO2 27 22 - 32 mmol/L   Glucose, Bld 284 (H) 70 - 99 mg/dL   BUN 42 (H) 8  - 23 mg/dL   Creatinine, Ser 1.61 (H) 0.44 - 1.00 mg/dL   Calcium 9.2 8.9 - 10.3 mg/dL   Total Protein 7.3 6.5 - 8.1 g/dL   Albumin 4.0 3.5 - 5.0 g/dL   AST 16 15 - 41 U/L   ALT 10 0 - 44 U/L   Alkaline Phosphatase 67 38 - 126 U/L   Total Bilirubin 1.0 0.3 - 1.2 mg/dL   GFR calc non Af Amer 28 (L) >60 mL/min   GFR calc Af Amer 32 (L) >60 mL/min   Anion gap 15 5 - 15    Comment: Performed at [facility] [address] [place], [place] [address]  Ammonia     Status: None   Collection Time: 07/16/19 10:29 PM  Result Value Ref Range   Ammonia 14 9 - 35 umol/L    Comment: Performed at [facility], [place] [address], [place], [place] [address]  Protime-INR     Status: None   Collection Time: 07/16/19 10:29 PM  Result Value Ref Range   Prothrombin Time 14.0 11.4 - 15.2 seconds   INR 1.1 0.8 - 1.2    Comment: (NOTE) INR goal varies based on device and disease states. Performed at [facility], [place] [address] [place],  [address]   Troponin I (High Sensitivity)     Status: Abnormal   Collection Time: 07/16/19 10:29 PM  Result Value Ref Range   Troponin I (High Sensitivity) 36 (H) <18 ng/L    Comment: (NOTE) Elevated high sensitivity troponin  I (hsTnI) values and significant  changes across serial measurements may suggest ACS but many other  chronic and acute conditions are known to elevate hsTnI results.  Refer to the "Links" section for chest pain algorithms and additional  guidance. Performed at [facility], [place] [address], [place], [place] [address]   Lipase, blood     Status: None   Collection Time: 07/16/19 10:29 PM  Result Value Ref Range   Lipase 25 11 - 51 U/L    Comment: Performed at [facility] [address] [place], [place] [address]  Urinalysis, Routine w reflex microscopic     Status: Abnormal   Collection Time: 07/16/19 10:30 PM  Result Value Ref Range   Color, Urine YELLOW YELLOW   APPearance HAZY (A) CLEAR   Specific  Gravity, Urine 1.017 1.005 - 1.030   pH 7.0 5.0 - 8.0   Glucose, UA 150 (A) NEGATIVE mg/dL   Hgb urine dipstick NEGATIVE NEGATIVE   Bilirubin Urine NEGATIVE NEGATIVE   Ketones, ur NEGATIVE NEGATIVE mg/dL   Protein, ur 100 (A) NEGATIVE mg/dL   Nitrite NEGATIVE NEGATIVE   Leukocytes,Ua LARGE (A) NEGATIVE   RBC / HPF 0-5 0 - 5 RBC/hpf   WBC, UA 11-20 0 - 5 WBC/hpf   Bacteria, UA RARE (A) NONE SEEN   Squamous Epithelial / LPF 11-20 0 - 5   Non Squamous Epithelial 0-5 (A) NONE SEEN    Comment: Performed at [facility], [place] [address] [place], [place] [address]  Lactic acid, plasma     Status: None   Collection Time: 07/16/19 10:31 PM  Result Value Ref Range   Lactic Acid, Venous 1.7 0.5 - 1.9 mmol/L    Comment: Performed at [facility] [address], [place], [place] [address]  TSH     Status: None   Collection Time: 07/16/19 10:40 PM  Result Value Ref Range   TSH 3.156 0.350 - 4.500 uIU/mL    Comment: Performed by a 3rd Generation assay with a functional sensitivity of <=0.01 uIU/mL. Performed at [facility], [place] [address], [place], [place] [address]   Brain natriuretic peptide     Status: Abnormal   Collection Time: 07/16/19 10:40 PM  Result Value Ref Range   B Natriuretic Peptide 278.7 (H) 0.0 - 100.0 pg/mL    Comment: Performed at [facility] [address], [place], [place] [address]  POC SARS Coronavirus 2 Ag-ED - Nasal Swab (BD Veritor Kit)     Status: None   Collection Time: 07/16/19 11:15 PM  Result Value Ref Range   SARS Coronavirus 2 Ag NEGATIVE NEGATIVE    Comment: (NOTE) SARS-CoV-2 antigen NOT DETECTED.  Negative results are presumptive.  Negative results do not preclude SARS-CoV-2 infection and should not be used as the sole basis for treatment or other patient management decisions, including infection  control decisions, particularly in the presence of clinical signs and  symptoms consistent with COVID-19, or in those who have been  in contact with the virus.  Negative results must be combined with clinical observations, patient history, and epidemiological information. The expected result is Negative. Fact Sheet for Patients: PodPark.tn Fact Sheet for Healthcare Providers: GiftContent.is This test is not yet approved or cleared by the [place] FDA and  has been authorized for detection and/or diagnosis of SARS-CoV-2 by FDA under an Emergency Use Authorization (EUA).  This EUA will remain in effect (meaning this test  can be used) for the duration of  the COVID-19 de claration under Section 564(b)(1) of the Act, 21 U.S.C. section 360bbb-3(b)(1), unless the authorization is terminated or revoked sooner.   SARS CORONAVIRUS 2 (TAT 6-24 HRS) Nasopharyngeal Nasopharyngeal Swab     Status: None   Collection Time: 07/16/19 11:20 PM   Specimen: Nasopharyngeal Swab  Result Value Ref Range   SARS Coronavirus 2 NEGATIVE NEGATIVE    Comment: (NOTE) SARS-CoV-2 target nucleic acids are NOT DETECTED. The SARS-CoV-2 RNA is generally detectable in upper and lower respiratory specimens during the acute phase of infection. Negative results do not preclude SARS-CoV-2 infection, do not rule out co-infections with other pathogens, and should not be used as the sole basis for treatment or other patient management decisions. Negative results must be combined with clinical observations, patient history, and epidemiological information. The expected result is Negative. Fact Sheet for Patients: SugarRoll.be Fact Sheet for Healthcare Providers: https://www.woods-mathews.com/ This test is not yet approved or cleared by the [place] FDA and  has been authorized for detection and/or diagnosis of SARS-CoV-2 by FDA under an Emergency Use Authorization (EUA). This EUA will remain  in effect (meaning this test can be used) for the  duration of the COVID-19 declaration under Section 56 4(b)(1) of the Act, 21 U.S.C. section 360bbb-3(b)(1), unless the authorization is terminated or revoked sooner. Performed at [facility], [place] [address] [place], [place] [address]   D-dimer, quantitative (not at [facility])     Status: Abnormal   Collection Time: 07/16/19 11:31 PM  Result Value Ref Range   D-Dimer, Quant >20.00 (H) 0.00 - 0.50 ug/mL-FEU    Comment: REPEATED TO VERIFY (NOTE) At the manufacturer cut-off of 0.50 ug/mL FEU, this assay has been documented to exclude PE with a sensitivity and negative predictive value of 97 to 99%.  At this time, this assay has not been approved by the FDA to exclude DVT/VTE. Results should be correlated with clinical presentation. Performed at [facility], [place] [address], [place], [place] [address]   Urine rapid drug screen (hosp performed)not at [facility]     Status: None   Collection Time: 07/16/19 11:31 PM  Result Value Ref Range   Opiates NONE DETECTED NONE DETECTED   Cocaine NONE DETECTED NONE DETECTED   Benzodiazepines NONE DETECTED NONE DETECTED   Amphetamines NONE DETECTED NONE DETECTED   Tetrahydrocannabinol NONE DETECTED NONE DETECTED   Barbiturates NONE DETECTED NONE DETECTED    Comment: (NOTE) DRUG SCREEN FOR MEDICAL PURPOSES ONLY.  IF CONFIRMATION IS NEEDED FOR ANY PURPOSE, NOTIFY LAB WITHIN 5 DAYS. LOWEST DETECTABLE LIMITS FOR URINE DRUG SCREEN Drug Class                     Cutoff (ng/mL) Amphetamine and metabolites    1000 Barbiturate and metabolites    200 Benzodiazepine                 672 Tricyclics and metabolites     300 Opiates and metabolites        300 Cocaine and metabolites        300 THC                            50 Performed at [facility], [place] [address] [place], [place] [address]   Troponin I (High Sensitivity)     Status: None   Collection Time: 07/17/19  1:54  AM  Result Value Ref Range   Troponin I (High Sensitivity) 6 <18  ng/L    Comment: (NOTE) Elevated high sensitivity troponin I (hsTnI) values and significant  changes across serial measurements may suggest ACS but many other  chronic and acute conditions are known to elevate hsTnI results.  Refer to the Links section for chest pain algorithms and additional  guidance. Performed at [facility], [place] [address] [place], [place] [address]     Chemistries  Recent Labs  Lab 07/16/19 2229  NA 144  K 3.8  CL 102  CO2 27  GLUCOSE 284*  BUN 42*  CREATININE 1.61*  CALCIUM 9.2  AST 16  ALT 10  ALKPHOS 67  BILITOT 1.0   ------------------------------------------------------------------------------------------------------------------  ------------------------------------------------------------------------------------------------------------------ GFR: CrCl cannot be calculated (Unknown ideal weight.). Liver Function Tests: Recent Labs  Lab 07/16/19 2229  AST 16  ALT 10  ALKPHOS 67  BILITOT 1.0  PROT 7.3  ALBUMIN 4.0   Recent Labs  Lab 07/16/19 2229  LIPASE 25   Recent Labs  Lab 07/16/19 2229  AMMONIA 14   Coagulation Profile: Recent Labs  Lab 07/16/19 2229  INR 1.1   Cardiac Enzymes: No results for input(s): CKTOTAL, CKMB, CKMBINDEX, TROPONINI in the last 168 hours. BNP (last 3 results) No results for input(s): PROBNP in the last 8760 hours. HbA1C: No results for input(s): HGBA1C in the last 72 hours. CBG: No results for input(s): GLUCAP in the last 168 hours. Lipid Profile: No results for input(s): CHOL, HDL, LDLCALC, TRIG, CHOLHDL, LDLDIRECT in the last 72 hours. Thyroid Function Tests: Recent Labs    07/16/19 2240  TSH 3.156   Anemia Panel: No results for input(s): VITAMINB12, FOLATE, FERRITIN, TIBC, IRON, RETICCTPCT in the last 72 hours.  --------------------------------------------------------------------------------------------------------------- Urine analysis:    Component Value Date/Time    COLORURINE YELLOW 07/16/2019 2230   APPEARANCEUR HAZY (A) 07/16/2019 2230   LABSPEC 1.017 07/16/2019 2230   PHURINE 7.0 07/16/2019 2230   GLUCOSEU 150 (A) 07/16/2019 2230   HGBUR NEGATIVE 07/16/2019 2230   BILIRUBINUR NEGATIVE 07/16/2019 2230   KETONESUR NEGATIVE 07/16/2019 2230   PROTEINUR 100 (A) 07/16/2019 2230   UROBILINOGEN 0.2 06/03/2013 1938   NITRITE NEGATIVE 07/16/2019 2230   LEUKOCYTESUR LARGE (A) 07/16/2019 2230      Imaging Results:    CT Head Wo Contrast  Result Date: 07/17/2019 CLINICAL DATA:  Encephalopathy EXAM: CT HEAD WITHOUT CONTRAST TECHNIQUE: Contiguous axial images were obtained from the base of the skull through the vertex without intravenous contrast. COMPARISON:  Head CT 12/03/2018 FINDINGS: Brain: There is no mass, hemorrhage or extra-axial collection. The size and configuration of the ventricles and extra-axial CSF spaces are normal. There is hypoattenuation of the white matter, most commonly indicating chronic small vessel disease. Vascular: Atherosclerotic calcification of the internal carotid arteries at the skull base. No abnormal hyperdensity of the major intracranial arteries or dural venous sinuses. Skull: The visualized skull base, calvarium and extracranial soft tissues are normal. Sinuses/Orbits: No fluid levels or advanced mucosal thickening of the visualized paranasal sinuses. No mastoid or middle ear effusion. The orbits are normal. IMPRESSION: Findings of chronic small vessel disease without acute intracranial abnormality. Electronically Signed   By: Ulyses Jarred M.D.   On: 07/17/2019 04:56   CT Angio Chest PE W and/or Wo Contrast  Result Date: 07/17/2019 CLINICAL DATA:  Generalized body aches, weakness and hypoxia EXAM: CT ANGIOGRAPHY CHEST CT ABDOMEN AND PELVIS WITH CONTRAST TECHNIQUE: Multidetector CT imaging of  the chest was performed using the standard protocol during bolus administration of intravenous contrast. Multiplanar CT image  reconstructions and MIPs were obtained to evaluate the vascular anatomy. Multidetector CT imaging of the abdomen and pelvis was performed using the standard protocol during bolus administration of intravenous contrast. CONTRAST:  69m OMNIPAQUE IOHEXOL 350 MG/ML SOLN COMPARISON:  None. FINDINGS: CTA CHEST FINDINGS Cardiovascular: Contrast injection is sufficient to demonstrate satisfactory opacification of the pulmonary arteries to the segmental level. There is no pulmonary embolus. The main pulmonary artery is within normal limits for size. There is no CT evidence of acute right heart strain. There is calcific atherosclerosis of the aorta. There is a stent in the proximal left subclavian artery. There is a normal 3-vessel arch branching pattern. Heart size is normal, without pericardial effusion. Mediastinum/Nodes: The esophagus is patulous and fluid-filled. There is no mediastinal or axillary lymphadenopathy. Lungs/Pleura: Small left pleural effusion with basilar atelectasis. Left apical scarring. Musculoskeletal: Remote median sternotomy no chest wall abnormality. Review of the MIP images confirms the above findings. CT ABDOMEN and PELVIS FINDINGS Hepatobiliary: Normal hepatic contours and density. No visible biliary dilatation. Unchanged appearance of dilated and thickened gallbladder with large complex gallstone. Pancreas: Normal contours without ductal dilatation. No peripancreatic fluid collection. Spleen: Hypodense focus of the medial spleen is unchanged. Adrenals/Urinary Tract: --Adrenal glands: Normal. --Right kidney/ureter: No hydronephrosis or perinephric stranding. No nephrolithiasis. No obstructing ureteral stones. --Left kidney/ureter: No hydronephrosis or perinephric stranding. No nephrolithiasis. No obstructing ureteral stones. --Urinary bladder: Unremarkable. Stomach/Bowel: --Stomach/Duodenum: Stomach is distended. There is marked dilatation of the duodenum, similar to the prior examination.  --Small bowel: There is dilatation of the proximal small bowel which is fluid filled and quite similar in configuration to the study from 12/29/2018. There is no clear transition point but the ileum is decompressed. --Colon: There is rectosigmoid diverticulosis without acute inflammation. --Appendix: Not visualized Vascular/Lymphatic: Atherosclerotic calcification is present within the non-aneurysmal abdominal aorta, without hemodynamically significant stenosis. No abdominal or pelvic lymphadenopathy. Reproductive: Status post hysterectomy. No adnexal mass. Musculoskeletal. Grade 1 L4-5 anterolisthesis with right L4 pars interarticularis defect. Other: None. IMPRESSION: 1. No pulmonary embolus or acute aortic syndrome. 2. Small bowel obstruction with markedly dilated esophagus, stomach, duodenum and jejunum. No clear transition point is identified, but the configuration of the small bowel is similar to the study of 12/29/18. 3. Small left pleural effusion and basilar atelectasis. 4. Unchanged appearance of dilated and thickened gallbladder with large complex gallstone. 5. Aortic Atherosclerosis (ICD10-I70.0). Electronically Signed   By: KUlyses JarredM.D.   On: 07/17/2019 05:12   CT ABDOMEN PELVIS W CONTRAST  Result Date: 07/17/2019 CLINICAL DATA:  Generalized body aches, weakness and hypoxia EXAM: CT ANGIOGRAPHY CHEST CT ABDOMEN AND PELVIS WITH CONTRAST TECHNIQUE: Multidetector CT imaging of the chest was performed using the standard protocol during bolus administration of intravenous contrast. Multiplanar CT image reconstructions and MIPs were obtained to evaluate the vascular anatomy. Multidetector CT imaging of the abdomen and pelvis was performed using the standard protocol during bolus administration of intravenous contrast. CONTRAST:  814mOMNIPAQUE IOHEXOL 350 MG/ML SOLN COMPARISON:  None. FINDINGS: CTA CHEST FINDINGS Cardiovascular: Contrast injection is sufficient to demonstrate satisfactory  opacification of the pulmonary arteries to the segmental level. There is no pulmonary embolus. The main pulmonary artery is within normal limits for size. There is no CT evidence of acute right heart strain. There is calcific atherosclerosis of the aorta. There is a stent in the proximal left subclavian artery. There is a  normal 3-vessel arch branching pattern. Heart size is normal, without pericardial effusion. Mediastinum/Nodes: The esophagus is patulous and fluid-filled. There is no mediastinal or axillary lymphadenopathy. Lungs/Pleura: Small left pleural effusion with basilar atelectasis. Left apical scarring. Musculoskeletal: Remote median sternotomy no chest wall abnormality. Review of the MIP images confirms the above findings. CT ABDOMEN and PELVIS FINDINGS Hepatobiliary: Normal hepatic contours and density. No visible biliary dilatation. Unchanged appearance of dilated and thickened gallbladder with large complex gallstone. Pancreas: Normal contours without ductal dilatation. No peripancreatic fluid collection. Spleen: Hypodense focus of the medial spleen is unchanged. Adrenals/Urinary Tract: --Adrenal glands: Normal. --Right kidney/ureter: No hydronephrosis or perinephric stranding. No nephrolithiasis. No obstructing ureteral stones. --Left kidney/ureter: No hydronephrosis or perinephric stranding. No nephrolithiasis. No obstructing ureteral stones. --Urinary bladder: Unremarkable. Stomach/Bowel: --Stomach/Duodenum: Stomach is distended. There is marked dilatation of the duodenum, similar to the prior examination. --Small bowel: There is dilatation of the proximal small bowel which is fluid filled and quite similar in configuration to the study from 12/29/2018. There is no clear transition point but the ileum is decompressed. --Colon: There is rectosigmoid diverticulosis without acute inflammation. --Appendix: Not visualized Vascular/Lymphatic: Atherosclerotic calcification is present within the  non-aneurysmal abdominal aorta, without hemodynamically significant stenosis. No abdominal or pelvic lymphadenopathy. Reproductive: Status post hysterectomy. No adnexal mass. Musculoskeletal. Grade 1 L4-5 anterolisthesis with right L4 pars interarticularis defect. Other: None. IMPRESSION: 1. No pulmonary embolus or acute aortic syndrome. 2. Small bowel obstruction with markedly dilated esophagus, stomach, duodenum and jejunum. No clear transition point is identified, but the configuration of the small bowel is similar to the study of 12/29/18. 3. Small left pleural effusion and basilar atelectasis. 4. Unchanged appearance of dilated and thickened gallbladder with large complex gallstone. 5. Aortic Atherosclerosis (ICD10-I70.0). Electronically Signed   By: Ulyses Jarred M.D.   On: 07/17/2019 05:12   DG Chest Portable 1 View  Result Date: 07/16/2019 CLINICAL DATA:  Generalized weakness EXAM: PORTABLE CHEST 1 VIEW COMPARISON:  12/28/2017 FINDINGS: Cardiac shadow is stable. Postsurgical changes are seen. Aortic calcifications are noted. Left subclavian arterial stent is seen. The overall inspiratory effort is poor with crowding of the vascular markings. No focal infiltrate or sizable effusion is seen. No bony abnormality is noted. IMPRESSION: Poor inspiratory effort without acute abnormality. Electronically Signed   By: Inez Catalina M.D.   On: 07/16/2019 23:08       Assessment & Plan:    Active Problems:   HTN (hypertension)   DM (diabetes mellitus) (HCC)   SBO (small bowel obstruction) (HCC)   CKD (chronic kidney disease), stage III   Elevated troponin   Acute lower UTI  SBO NPO Ns iv Dilaudid 0.23m iv q6h prn zofran 4432miv q6h prn  Surgery consulted by ED, appreciate input  Acute lower uti Urine culture Rocephin 1gm iv qday  Leukocytosis secondary to uti Check cbc in am  Elevated troponin Check cardiac echo  CKD stage 3 Check cmp in am  CAD s/p CABG Cont aspirin  Cont  Lovastatin-> Pravastatin Carvedilol -> metoprolol 2.32m46mv bid Hydralazine-> hydralazine 15m73m tid and 32mg 22mq6h prn sbp >160  Dm2 fsbs q4h, ISS   DVT Prophylaxis-   Lovenox - SCDs   AM Labs Ordered, also please review Full Orders  Family Communication: Admission, patients condition and plan of care including tests being ordered have been discussed with the patient  who indicate understanding and agree with the plan and Code Status.  Code Status:  FULL CODE per patient,  husband present with patient in ED   Admission status:  Inpatient: Based on patients clinical presentation and evaluation of above clinical data, I have made determination that patient meets Inpatient criteria at this time. Pt has SBO and will require > 2 nites stay,  Pt has high risk of clinical deterioration .   Time spent in minutes : 55 minutes   Jani Gravel M.D on 07/17/2019 at 6:38 AM

## 2019-07-17 NOTE — Progress Notes (Signed)
Pt just ripped apart mitten and the small beads from inside of the mitten are all over the floor and her. Pt moaning and not cooperating with staff when trying to get clean from BM and the beads from mitten. Pt appears to be in pain by moaning and grimacing. Husband stated that he does want pt to have some pain medicine. IV Dilaudid PRN .5mg  was given at this time and pt was cleaned up. Pt appears to be resting comfortably now. Will continue to monitor.   Eleanora Neighbor, RN

## 2019-07-18 ENCOUNTER — Inpatient Hospital Stay (HOSPITAL_COMMUNITY): Payer: Medicare Other

## 2019-07-18 DIAGNOSIS — E870 Hyperosmolality and hypernatremia: Secondary | ICD-10-CM

## 2019-07-18 DIAGNOSIS — E876 Hypokalemia: Secondary | ICD-10-CM

## 2019-07-18 DIAGNOSIS — I361 Nonrheumatic tricuspid (valve) insufficiency: Secondary | ICD-10-CM

## 2019-07-18 DIAGNOSIS — I10 Essential (primary) hypertension: Secondary | ICD-10-CM

## 2019-07-18 DIAGNOSIS — E119 Type 2 diabetes mellitus without complications: Secondary | ICD-10-CM

## 2019-07-18 DIAGNOSIS — R4182 Altered mental status, unspecified: Secondary | ICD-10-CM

## 2019-07-18 DIAGNOSIS — N183 Chronic kidney disease, stage 3 unspecified: Secondary | ICD-10-CM

## 2019-07-18 DIAGNOSIS — N390 Urinary tract infection, site not specified: Secondary | ICD-10-CM

## 2019-07-18 DIAGNOSIS — F05 Delirium due to known physiological condition: Secondary | ICD-10-CM

## 2019-07-18 LAB — GLUCOSE, CAPILLARY
Glucose-Capillary: 117 mg/dL — ABNORMAL HIGH (ref 70–99)
Glucose-Capillary: 125 mg/dL — ABNORMAL HIGH (ref 70–99)
Glucose-Capillary: 134 mg/dL — ABNORMAL HIGH (ref 70–99)
Glucose-Capillary: 150 mg/dL — ABNORMAL HIGH (ref 70–99)
Glucose-Capillary: 161 mg/dL — ABNORMAL HIGH (ref 70–99)
Glucose-Capillary: 175 mg/dL — ABNORMAL HIGH (ref 70–99)

## 2019-07-18 LAB — COMPREHENSIVE METABOLIC PANEL
ALT: 9 U/L (ref 0–44)
AST: 15 U/L (ref 15–41)
Albumin: 3.2 g/dL — ABNORMAL LOW (ref 3.5–5.0)
Alkaline Phosphatase: 47 U/L (ref 38–126)
Anion gap: 14 (ref 5–15)
BUN: 34 mg/dL — ABNORMAL HIGH (ref 8–23)
CO2: 25 mmol/L (ref 22–32)
Calcium: 8.3 mg/dL — ABNORMAL LOW (ref 8.9–10.3)
Chloride: 110 mmol/L (ref 98–111)
Creatinine, Ser: 1.42 mg/dL — ABNORMAL HIGH (ref 0.44–1.00)
GFR calc Af Amer: 37 mL/min — ABNORMAL LOW (ref >60)
GFR calc non Af Amer: 32 mL/min — ABNORMAL LOW (ref >60)
Glucose, Bld: 156 mg/dL — ABNORMAL HIGH (ref 70–99)
Potassium: 3.1 mmol/L — ABNORMAL LOW (ref 3.5–5.1)
Sodium: 149 mmol/L — ABNORMAL HIGH (ref 135–145)
Total Bilirubin: 0.8 mg/dL (ref 0.3–1.2)
Total Protein: 6 g/dL — ABNORMAL LOW (ref 6.5–8.1)

## 2019-07-18 LAB — CBC
HCT: 28.6 % — ABNORMAL LOW (ref 36.0–46.0)
Hemoglobin: 8.9 g/dL — ABNORMAL LOW (ref 12.0–15.0)
MCH: 26 pg (ref 26.0–34.0)
MCHC: 31.1 g/dL (ref 30.0–36.0)
MCV: 83.6 fL (ref 80.0–100.0)
Platelets: 143 10*3/uL — ABNORMAL LOW (ref 150–400)
RBC: 3.42 MIL/uL — ABNORMAL LOW (ref 3.87–5.11)
RDW: 16 % — ABNORMAL HIGH (ref 11.5–15.5)
WBC: 10 10*3/uL (ref 4.0–10.5)
nRBC: 0 % (ref 0.0–0.2)

## 2019-07-18 LAB — ECHOCARDIOGRAM COMPLETE

## 2019-07-18 MED ORDER — KCL IN DEXTROSE-NACL 20-5-0.45 MEQ/L-%-% IV SOLN
INTRAVENOUS | Status: AC
  Administered 2019-07-18 – 2019-07-19 (×2): via INTRAVENOUS
  Filled 2019-07-18 (×2): qty 1000

## 2019-07-18 NOTE — Progress Notes (Signed)
[facility]  PROGRESS NOTE  Sonya Chavez ZOX:[id number] DOB: [birth year] DOA: 07/16/2019 PCP: Alysia Penna, MD Admit date - 07/16/2019   Admitting Physician Pearson Grippe, MD  Outpatient Primary MD for the patient is Alysia Penna, MD  LOS - 1 Brief Narrative   Sonya Chavez is a 85 y.o. year old female with medical history significant for CAD status post CABG, HTN, type 2 diabetes, prior small bowel obstruction (hospitalized 01/2019 with concern for cholecystitis but patient declined HIDA scan , March/2020) who presented on 07/16/2019 with emesis and vomiting x1 day with associated worsening confusion reported by husband and was found to have small bowel obstruction with AMS and likely UTI.   Husband provides all relevant history, states patient has been feeling well before suddenly developing nausea and emesis episodes with worsening confusion. He brought her to the ED because he was concerned she had developed another obstruction.  He reports she had a normal bowel movement the day prior.  In the ED: T-max 98.9 blood pressure 177/68.  Lab work notable for glucose of 234, D-dimer greater than 20, BNP 278.  UA with pyuria with rare bacteria.  WBC 18.2.  Creatinine 1.61 (baseline). Chest x-ray no acute findings.  CTA obtained due to elevated D-dimer with reported hypoxia of 86% on room air in ED, it was negative for PE or any abnormal lung findings but did confirm small bowel obstruction.  NG tube was placed, surgery was consulted, patient was [facility].     Subjective  Ms.  Chavez today is a bit more alert today she opens her eyes while I am in the room.  Husband at bedside  A & P   Small bowel obstruction, resolved.  Presume risk factor being adhesions given previous abdominal surgeries.  Per husband this is third episode.  Repeat x-ray on SBO Gastrografin protocol shows resolution of SBO abdomen is nonacute,  --Given improvement  in x-ray, surgery recommends discontinuing NG tube, advance diet to clears and monitor -IV 0.5 mg Dilaudid every 6 hours as needed severe pain -IV Zofran as needed,  Hypoactive Delirium slowly improving in the setting of SBO and associated pain, UTI and hypernatremia.  CT head negative for acute abnormalities.  TSH, ammonia,Electrolytes within normal limits per husband baseline is functional and independent of ADLs and corroborated on previous documentation from last hospital stay in 01/2019.  Not following commands on my exam but purposely moving all extremities without focal deficits and now opening eyes to voice -Delirium precautions -Closely monitor  Hypernatremia in the setting of diminished p.o. intake. -Start D5 half-normal saline -Monitor Advancing diet to clear liquids -Trend BMP  Hypokalemia.  Not having any emesis episodes here. -Supplement IV now -trend BMP  Elevated D-dimer.  Likely result of inflammatory marker increased from SBO.  CTA negative for PE and patient has no O2 requirements -Closely monitor  Klebsiella UTI Urine culture positive for Klebsiella, awaiting sensitivities --Agree with IV ceftriaxone, follow up urine culture sensitivities  Leukocytosis, improving.  Suspect stress leukocytosis in the setting of SBO, but also could be related to likely UTI.  Patient remains afebrile, chest x-ray/CT chest/abdomen with no other overt infectious process.  -Trend CBC, monitor blood cultures  Dilated and thickened gall bladder with large complex gallstone, chronic. Unchanged from 12/2018. Surgery does not think this is cause of acute problems. Patient declined HIDA scan last admission in 01/2019 LFTs/T bilirubin wnl. No visibile biliary dilatation on CT imaging, LFTs and T bilirubin wnl -  received flagyl in ED, surgery recommends not continuing --closely monitor   High-sensitivity troponin.  Initial level 36--6. Not consistent with ACS given decrease in 2 hours. No symptoms  and no acute ischemic changes on EKG.  -TTE ordered on admission  CKD, stage III, stable.  Most recent creatinine was 01/2019 1.6, currently remained stable -Avoid nephrotoxins, monitor BMP, monitor urine output  Normocytic anemia, chronic.  Hemoglobin stable -Daily CBC  Hypertension, still elevated but improved.  Blood pressure range 148-156/53-51.  In setting of not been able to tolerate p.o.  -Holding home amlodipine, carvedilol, hydralazine giving SBO, -IV hydralazine as needed, IV hydralazine 10 mg scheduled q8 Hrs, d/c IV metoprolol  Hyperlipidemia, stable.  -- Given n.p.o. status we will continue to hold home statin therapies  Type 2 diabetes with peripheral neuropathy.  A1c. 7.5(12/2018) -Sliding scale as needed--n.p.o. protocol, monitor CBGs -Holding home gabapentin given n.p.o. status.  CAD.  History of CABG.  No obvious cardiac symptoms. -Holding home aspirin and statin given n.p.o. status. -Closely monitor.    Family Communication  : Husband updated at bedside  Code Status : Full code, confirmed by husband on day of admission  Disposition Plan  : Allow clear liquids, monitor and advance diet as tolerated, IV ceftriaxone while awaiting urine culture sensitivities  Consults  : Surgery  Procedures  : TTE pending  DVT Prophylaxis  :  Lovenox  Lab Results  Component Value Date   PLT 143 (L) 07/18/2019    Diet :  Diet Order            Diet clear liquid Room service appropriate? Yes; Fluid consistency: Thin  Diet effective now               Inpatient Medications Scheduled Meds: . aspirin EC  81 mg Oral QHS  . enoxaparin (LOVENOX) injection  30 mg Subcutaneous Q24H  . hydrALAZINE  10 mg Intravenous Q8H  . insulin aspart  0-9 Units Subcutaneous Q4H  . metoprolol tartrate  2.5 mg Intravenous BID   Continuous Infusions: . cefTRIAXone (ROCEPHIN)  IV 1 g (07/18/19 0233)  . dextrose 5 % and 0.45 % NaCl with KCl 20 mEq/L 75 mL/hr at 07/18/19 1344   PRN  Meds:.acetaminophen **OR** acetaminophen, hydrALAZINE, HYDROmorphone (DILAUDID) injection, ondansetron (ZOFRAN) IV  Antibiotics  :   Anti-infectives (From admission, onward)   Start     Dose/Rate Route Frequency Ordered Stop   07/18/19 0200  cefTRIAXone (ROCEPHIN) 1 g in sodium chloride 0.9 % 100 mL IVPB     1 g 200 mL/hr over 30 Minutes Intravenous Every 24 hours 07/17/19 0636     07/17/19 0545  metroNIDAZOLE (FLAGYL) IVPB 500 mg     500 mg 100 mL/hr over 60 Minutes Intravenous  Once 07/17/19 0540 07/17/19 0710   07/17/19 0200  cefTRIAXone (ROCEPHIN) 1 g in sodium chloride 0.9 % 100 mL IVPB     1 g 200 mL/hr over 30 Minutes Intravenous  Once 07/17/19 0153 07/17/19 0350       Objective   Vitals:   07/17/19 2023 07/18/19 0459 07/18/19 0910 07/18/19 1209  BP:  (!) 156/53    Pulse: 89 72 (P) 72   Resp:  14    Temp:  97.8 F (36.6 C) (P) 98.4 F (36.9 C) 100 F (37.8 C)  TempSrc:  Axillary (P) Axillary Oral  SpO2: 92% 94%      SpO2: 94 % O2 Flow Rate (L/min): 2 L/min  Wt Readings from Last 3  Encounters:  01/05/19 64.3 kg  12/03/18 59 kg  12/28/17 59.4 kg     Intake/Output Summary (Last 24 hours) at 07/18/2019 1535 Last data filed at 07/18/2019 1300 Gross per 24 hour  Intake 1403.75 ml  Output 160 ml  Net 1243.75 ml    Physical Exam:  Lying in bed, opens eyes occasionally to voice Moving all extremities on her own but not following commands NG tube in place draining bilious fluid Regular rate and rhythm, systolic ejection murmur present, no edema Abdomen soft, nondistended, nontender, minimal bowel sounds No appreciable rashes Lungs clear bilaterally, on room air, normal respiratory effort    I have personally reviewed the following:   Data Reviewed:  CBC Recent Labs  Lab 07/16/19 2229 07/18/19 0509  WBC 18.2* 10.0  HGB 11.3* 8.9*  HCT 36.4 28.6*  PLT 173 143*  MCV 83.7 83.6  MCH 26.0 26.0  MCHC 31.0 31.1  RDW 15.8* 16.0*  LYMPHSABS 0.8  --     MONOABS 1.1*  --   EOSABS 0.0  --   BASOSABS 0.0  --     Chemistries  Recent Labs  Lab 07/16/19 2229 07/18/19 0509  NA 144 149*  K 3.8 3.1*  CL 102 110  CO2 27 25  GLUCOSE 284* 156*  BUN 42* 34*  CREATININE 1.61* 1.42*  CALCIUM 9.2 8.3*  AST 16 15  ALT 10 9  ALKPHOS 67 47  BILITOT 1.0 0.8   ------------------------------------------------------------------------------------------------------------------ No results for input(s): CHOL, HDL, LDLCALC, TRIG, CHOLHDL, LDLDIRECT in the last 72 hours.  Lab Results  Component Value Date   HGBA1C 7.5 (H) 12/30/2018   ------------------------------------------------------------------------------------------------------------------ Recent Labs    07/16/19 2240  TSH 3.156   ------------------------------------------------------------------------------------------------------------------ No results for input(s): VITAMINB12, FOLATE, FERRITIN, TIBC, IRON, RETICCTPCT in the last 72 hours.  Coagulation profile Recent Labs  Lab 07/16/19 2229  INR 1.1    Recent Labs    07/16/19 2331  DDIMER >20.00*    Cardiac Enzymes No results for input(s): CKMB, TROPONINI, MYOGLOBIN in the last 168 hours.  Invalid input(s): CK ------------------------------------------------------------------------------------------------------------------    Component Value Date/Time   BNP 278.7 (H) 07/16/2019 2240    Micro Results Recent Results (from the past 240 hour(s))  Blood culture (routine x 2)     Status: None (Preliminary result)   Collection Time: 07/16/19 11:10 PM   Specimen: BLOOD LEFT WRIST  Result Value Ref Range Status   Specimen Description BLOOD LEFT WRIST  Final   Special Requests   Final    BOTTLES DRAWN AEROBIC AND ANAEROBIC Blood Culture results may not be optimal due to an inadequate volume of blood received in culture bottles   Culture   Final    NO GROWTH 1 DAY Performed at [facility], [address]  [place], [place] [address]    Report Status PENDING  Incomplete  Urine culture     Status: Abnormal (Preliminary result)   Collection Time: 07/16/19 11:20 PM   Specimen: Urine, Random  Result Value Ref Range Status   Specimen Description URINE, RANDOM  Final   Special Requests   Final    NONE Performed at [facility], [address] [place], [place] [address]    Culture >=100,000 COLONIES/mL KLEBSIELLA PNEUMONIAE (A)  Final   Report Status PENDING  Incomplete  SARS CORONAVIRUS 2 (TAT 6-24 HRS) Nasopharyngeal Nasopharyngeal Swab     Status: None   Collection Time: 07/16/19 11:20 PM   Specimen: Nasopharyngeal Swab  Result Value Ref Range Status   SARS Coronavirus 2 NEGATIVE NEGATIVE Final    Comment: (NOTE) SARS-CoV-2 target nucleic acids are NOT DETECTED. The SARS-CoV-2 RNA is generally detectable in upper and lower respiratory specimens during the acute phase of infection. Negative results do not preclude SARS-CoV-2 infection, do not rule out co-infections with other pathogens, and should not be used as the sole basis for treatment or other patient management decisions. Negative results must be combined with clinical observations, patient history, and epidemiological information. The expected result is Negative. Fact Sheet for Patients: HairSlick.nohttps://www.fda.gov/media/138098/download Fact Sheet for Healthcare Providers: quierodirigir.comhttps://www.fda.gov/media/138095/download This test is not yet approved or cleared by the [place] FDA and  has been authorized for detection and/or diagnosis of SARS-CoV-2 by FDA under an Emergency Use Authorization (EUA). This EUA will remain  in effect (meaning this test can be used) for the duration of the COVID-19 declaration under Section 56 4(b)(1) of the Act, 21 U.S.C. section 360bbb-3(b)(1), unless the authorization is terminated or revoked sooner. Performed at [facility], [address] [place], [place] [address]   Blood culture (routine  x 2)     Status: None (Preliminary result)   Collection Time: 07/17/19  1:54 AM   Specimen: Site Not Specified; Blood  Result Value Ref Range Status   Specimen Description SITE NOT SPECIFIED  Final   Special Requests   Final    AEROBIC BOTTLE ONLY Blood Culture results may not be optimal due to an inadequate volume of blood received in culture bottles   Culture   Final    NO GROWTH 1 DAY Performed at [facility], [address] [place], [place] [address]    Report Status PENDING  Incomplete    Radiology Reports CT Head Wo Contrast  Result Date: 07/17/2019 CLINICAL DATA:  Encephalopathy EXAM: CT HEAD WITHOUT CONTRAST TECHNIQUE: Contiguous axial images were obtained from the base of the skull through the vertex without intravenous contrast. COMPARISON:  Head CT 12/03/2018 FINDINGS: Brain: There is no mass, hemorrhage or extra-axial collection. The size and configuration of the ventricles and extra-axial CSF spaces are normal. There is hypoattenuation of the white matter, most commonly indicating chronic small vessel disease. Vascular: Atherosclerotic calcification of the internal carotid arteries at the skull base. No abnormal hyperdensity of the major intracranial arteries or dural venous sinuses. Skull: The visualized skull base, calvarium and extracranial soft tissues are normal. Sinuses/Orbits: No fluid levels or advanced mucosal thickening of the visualized paranasal sinuses. No mastoid or middle ear effusion. The orbits are normal. IMPRESSION: Findings of chronic small vessel disease without acute intracranial abnormality. Electronically Signed   By: Deatra RobinsonKevin  Herman M.D.   On: 07/17/2019 04:56   CT Angio Chest PE W and/or Wo Contrast  Result Date: 07/17/2019 CLINICAL DATA:  Generalized body aches, weakness and hypoxia EXAM: CT ANGIOGRAPHY CHEST CT ABDOMEN AND PELVIS WITH CONTRAST TECHNIQUE: Multidetector CT imaging of the chest was performed using the standard protocol during bolus  administration of intravenous contrast. Multiplanar CT image reconstructions and MIPs were obtained to evaluate the vascular anatomy. Multidetector CT imaging of the abdomen and pelvis was performed using the standard protocol during bolus administration of intravenous contrast. CONTRAST:  80mL OMNIPAQUE IOHEXOL 350 MG/ML SOLN COMPARISON:  None. FINDINGS: CTA CHEST FINDINGS Cardiovascular: Contrast injection is sufficient to demonstrate satisfactory opacification of the pulmonary arteries to the segmental level. There is no pulmonary embolus. The main pulmonary artery is within normal limits for size. There is no  CT evidence of acute right heart strain. There is calcific atherosclerosis of the aorta. There is a stent in the proximal left subclavian artery. There is a normal 3-vessel arch branching pattern. Heart size is normal, without pericardial effusion. Mediastinum/Nodes: The esophagus is patulous and fluid-filled. There is no mediastinal or axillary lymphadenopathy. Lungs/Pleura: Small left pleural effusion with basilar atelectasis. Left apical scarring. Musculoskeletal: Remote median sternotomy no chest wall abnormality. Review of the MIP images confirms the above findings. CT ABDOMEN and PELVIS FINDINGS Hepatobiliary: Normal hepatic contours and density. No visible biliary dilatation. Unchanged appearance of dilated and thickened gallbladder with large complex gallstone. Pancreas: Normal contours without ductal dilatation. No peripancreatic fluid collection. Spleen: Hypodense focus of the medial spleen is unchanged. Adrenals/Urinary Tract: --Adrenal glands: Normal. --Right kidney/ureter: No hydronephrosis or perinephric stranding. No nephrolithiasis. No obstructing ureteral stones. --Left kidney/ureter: No hydronephrosis or perinephric stranding. No nephrolithiasis. No obstructing ureteral stones. --Urinary bladder: Unremarkable. Stomach/Bowel: --Stomach/Duodenum: Stomach is distended. There is marked  dilatation of the duodenum, similar to the prior examination. --Small bowel: There is dilatation of the proximal small bowel which is fluid filled and quite similar in configuration to the study from 12/29/2018. There is no clear transition point but the ileum is decompressed. --Colon: There is rectosigmoid diverticulosis without acute inflammation. --Appendix: Not visualized Vascular/Lymphatic: Atherosclerotic calcification is present within the non-aneurysmal abdominal aorta, without hemodynamically significant stenosis. No abdominal or pelvic lymphadenopathy. Reproductive: Status post hysterectomy. No adnexal mass. Musculoskeletal. Grade 1 L4-5 anterolisthesis with right L4 pars interarticularis defect. Other: None. IMPRESSION: 1. No pulmonary embolus or acute aortic syndrome. 2. Small bowel obstruction with markedly dilated esophagus, stomach, duodenum and jejunum. No clear transition point is identified, but the configuration of the small bowel is similar to the study of 12/29/18. 3. Small left pleural effusion and basilar atelectasis. 4. Unchanged appearance of dilated and thickened gallbladder with large complex gallstone. 5. Aortic Atherosclerosis (ICD10-I70.0). Electronically Signed   By: Ulyses Jarred M.D.   On: 07/17/2019 05:12   CT ABDOMEN PELVIS W CONTRAST  Result Date: 07/17/2019 CLINICAL DATA:  Generalized body aches, weakness and hypoxia EXAM: CT ANGIOGRAPHY CHEST CT ABDOMEN AND PELVIS WITH CONTRAST TECHNIQUE: Multidetector CT imaging of the chest was performed using the standard protocol during bolus administration of intravenous contrast. Multiplanar CT image reconstructions and MIPs were obtained to evaluate the vascular anatomy. Multidetector CT imaging of the abdomen and pelvis was performed using the standard protocol during bolus administration of intravenous contrast. CONTRAST:  64mL OMNIPAQUE IOHEXOL 350 MG/ML SOLN COMPARISON:  None. FINDINGS: CTA CHEST FINDINGS Cardiovascular: Contrast  injection is sufficient to demonstrate satisfactory opacification of the pulmonary arteries to the segmental level. There is no pulmonary embolus. The main pulmonary artery is within normal limits for size. There is no CT evidence of acute right heart strain. There is calcific atherosclerosis of the aorta. There is a stent in the proximal left subclavian artery. There is a normal 3-vessel arch branching pattern. Heart size is normal, without pericardial effusion. Mediastinum/Nodes: The esophagus is patulous and fluid-filled. There is no mediastinal or axillary lymphadenopathy. Lungs/Pleura: Small left pleural effusion with basilar atelectasis. Left apical scarring. Musculoskeletal: Remote median sternotomy no chest wall abnormality. Review of the MIP images confirms the above findings. CT ABDOMEN and PELVIS FINDINGS Hepatobiliary: Normal hepatic contours and density. No visible biliary dilatation. Unchanged appearance of dilated and thickened gallbladder with large complex gallstone. Pancreas: Normal contours without ductal dilatation. No peripancreatic fluid collection. Spleen: Hypodense focus of the medial spleen is  unchanged. Adrenals/Urinary Tract: --Adrenal glands: Normal. --Right kidney/ureter: No hydronephrosis or perinephric stranding. No nephrolithiasis. No obstructing ureteral stones. --Left kidney/ureter: No hydronephrosis or perinephric stranding. No nephrolithiasis. No obstructing ureteral stones. --Urinary bladder: Unremarkable. Stomach/Bowel: --Stomach/Duodenum: Stomach is distended. There is marked dilatation of the duodenum, similar to the prior examination. --Small bowel: There is dilatation of the proximal small bowel which is fluid filled and quite similar in configuration to the study from 12/29/2018. There is no clear transition point but the ileum is decompressed. --Colon: There is rectosigmoid diverticulosis without acute inflammation. --Appendix: Not visualized Vascular/Lymphatic:  Atherosclerotic calcification is present within the non-aneurysmal abdominal aorta, without hemodynamically significant stenosis. No abdominal or pelvic lymphadenopathy. Reproductive: Status post hysterectomy. No adnexal mass. Musculoskeletal. Grade 1 L4-5 anterolisthesis with right L4 pars interarticularis defect. Other: None. IMPRESSION: 1. No pulmonary embolus or acute aortic syndrome. 2. Small bowel obstruction with markedly dilated esophagus, stomach, duodenum and jejunum. No clear transition point is identified, but the configuration of the small bowel is similar to the study of 12/29/18. 3. Small left pleural effusion and basilar atelectasis. 4. Unchanged appearance of dilated and thickened gallbladder with large complex gallstone. 5. Aortic Atherosclerosis (ICD10-I70.0). Electronically Signed   By: Deatra Robinson M.D.   On: 07/17/2019 05:12   DG Chest Portable 1 View  Result Date: 07/16/2019 CLINICAL DATA:  Generalized weakness EXAM: PORTABLE CHEST 1 VIEW COMPARISON:  12/28/2017 FINDINGS: Cardiac shadow is stable. Postsurgical changes are seen. Aortic calcifications are noted. Left subclavian arterial stent is seen. The overall inspiratory effort is poor with crowding of the vascular markings. No focal infiltrate or sizable effusion is seen. No bony abnormality is noted. IMPRESSION: Poor inspiratory effort without acute abnormality. Electronically Signed   By: Alcide Clever M.D.   On: 07/16/2019 23:08   DG Abd Portable 1V-Small Bowel Obstruction Protocol-initial, 8 hr delay  Result Date: 07/17/2019 CLINICAL DATA:  Small-bowel obstruction EXAM: PORTABLE ABDOMEN - 1 VIEW COMPARISON:  Same day study FINDINGS: Nasogastric tube is seen coiled within the stomach. There is enteric contrast present throughout the colon. No dilated loops of small bowel are seen. Excreted contrast noted within the urinary bladder. IMPRESSION: No evidence of small-bowel obstruction. Oral contrast is present within the colon.  Electronically Signed   By: Duanne Guess M.D.   On: 07/17/2019 22:56   DG Abd Portable 1V-Small Bowel Protocol-Position Verification  Result Date: 07/17/2019 CLINICAL DATA:  Nasogastric tube placement. EXAM: PORTABLE ABDOMEN - 1 VIEW COMPARISON:  Dec 31, 2018. FINDINGS: The bowel gas pattern is normal. Nasogastric tube tip is seen in proximal stomach. No radio-opaque calculi or other significant radiographic abnormality are seen. IMPRESSION: Nasogastric tube tip seen in proximal stomach. No evidence of bowel obstruction or ileus. Electronically Signed   By: Lupita Raider M.D.   On: 07/17/2019 09:53     Time Spent in minutes  30     Laverna Peace M.D on 07/18/2019 at 3:35 PM  To page go to [email or web address] - password [facility]

## 2019-07-18 NOTE — Progress Notes (Addendum)
  Echocardiogram 2D Echocardiogram has been performed.  Technically difficult study due to poor patient compliance. Patient proceeded to sit up and try to lay prone on tech's arm. Unable to assess further images due to patient position.  Clancy Leiner A Yocheved Depner 07/18/2019, 5:09 PM

## 2019-07-18 NOTE — Progress Notes (Signed)
RN called pharmacy to let them know husband has brought in medications for verification on pt's med list. Med Req on way to check them, per pharmacy.   Eleanora Neighbor, RN

## 2019-07-18 NOTE — Progress Notes (Signed)
Bed scale not working and pt is unable to stand.    Eleanora Neighbor, RN

## 2019-07-18 NOTE — Progress Notes (Signed)
Subjective/Chief Complaint: Does not say much   Objective: Vital signs in last 24 hours: Temp:  [97.8 F (36.6 C)-99.4 F (37.4 C)] (P) 98.4 F (36.9 C) (12/13 0910) Pulse Rate:  [72-93] (P) 72 (12/13 0910) Resp:  [14-20] 14 (12/13 0459) BP: (139-172)/(46-65) 156/53 (12/13 0459) SpO2:  [87 %-100 %] 94 % (12/13 0459) Last BM Date: 07/16/19(PTA)  Intake/Output from previous day: 12/12 0701 - 12/13 0700 In: 2503.8 [I.V.:2303.8; IV Piggyback:200] Out: 160 [Emesis/NG output:160] Intake/Output this shift: No intake/output data recorded.  General appearance: cooperative Resp: clear to auscultation bilaterally GI: soft, NT  Lab Results:  Recent Labs    07/16/19 2229 07/18/19 0509  WBC 18.2* 10.0  HGB 11.3* 8.9*  HCT 36.4 28.6*  PLT 173 143*   BMET Recent Labs    07/16/19 2229 07/18/19 0509  NA 144 149*  K 3.8 3.1*  CL 102 110  CO2 27 25  GLUCOSE 284* 156*  BUN 42* 34*  CREATININE 1.61* 1.42*  CALCIUM 9.2 8.3*   PT/INR Recent Labs    07/16/19 2229  LABPROT 14.0  INR 1.1   ABG No results for input(s): PHART, HCO3 in the last 72 hours.  Invalid input(s): PCO2, PO2  Studies/Results: CT Head Wo Contrast  Result Date: 07/17/2019 CLINICAL DATA:  Encephalopathy EXAM: CT HEAD WITHOUT CONTRAST TECHNIQUE: Contiguous axial images were obtained from the base of the skull through the vertex without intravenous contrast. COMPARISON:  Head CT 12/03/2018 FINDINGS: Brain: There is no mass, hemorrhage or extra-axial collection. The size and configuration of the ventricles and extra-axial CSF spaces are normal. There is hypoattenuation of the white matter, most commonly indicating chronic small vessel disease. Vascular: Atherosclerotic calcification of the internal carotid arteries at the skull base. No abnormal hyperdensity of the major intracranial arteries or dural venous sinuses. Skull: The visualized skull base, calvarium and extracranial soft tissues are normal.  Sinuses/Orbits: No fluid levels or advanced mucosal thickening of the visualized paranasal sinuses. No mastoid or middle ear effusion. The orbits are normal. IMPRESSION: Findings of chronic small vessel disease without acute intracranial abnormality. Electronically Signed   By: Deatra RobinsonKevin  Herman M.D.   On: 07/17/2019 04:56   CT Angio Chest PE W and/or Wo Contrast  Result Date: 07/17/2019 CLINICAL DATA:  Generalized body aches, weakness and hypoxia EXAM: CT ANGIOGRAPHY CHEST CT ABDOMEN AND PELVIS WITH CONTRAST TECHNIQUE: Multidetector CT imaging of the chest was performed using the standard protocol during bolus administration of intravenous contrast. Multiplanar CT image reconstructions and MIPs were obtained to evaluate the vascular anatomy. Multidetector CT imaging of the abdomen and pelvis was performed using the standard protocol during bolus administration of intravenous contrast. CONTRAST:  80mL OMNIPAQUE IOHEXOL 350 MG/ML SOLN COMPARISON:  None. FINDINGS: CTA CHEST FINDINGS Cardiovascular: Contrast injection is sufficient to demonstrate satisfactory opacification of the pulmonary arteries to the segmental level. There is no pulmonary embolus. The main pulmonary artery is within normal limits for size. There is no CT evidence of acute right heart strain. There is calcific atherosclerosis of the aorta. There is a stent in the proximal left subclavian artery. There is a normal 3-vessel arch branching pattern. Heart size is normal, without pericardial effusion. Mediastinum/Nodes: The esophagus is patulous and fluid-filled. There is no mediastinal or axillary lymphadenopathy. Lungs/Pleura: Small left pleural effusion with basilar atelectasis. Left apical scarring. Musculoskeletal: Remote median sternotomy no chest wall abnormality. Review of the MIP images confirms the above findings. CT ABDOMEN and PELVIS FINDINGS Hepatobiliary: Normal hepatic contours and  density. No visible biliary dilatation. Unchanged  appearance of dilated and thickened gallbladder with large complex gallstone. Pancreas: Normal contours without ductal dilatation. No peripancreatic fluid collection. Spleen: Hypodense focus of the medial spleen is unchanged. Adrenals/Urinary Tract: --Adrenal glands: Normal. --Right kidney/ureter: No hydronephrosis or perinephric stranding. No nephrolithiasis. No obstructing ureteral stones. --Left kidney/ureter: No hydronephrosis or perinephric stranding. No nephrolithiasis. No obstructing ureteral stones. --Urinary bladder: Unremarkable. Stomach/Bowel: --Stomach/Duodenum: Stomach is distended. There is marked dilatation of the duodenum, similar to the prior examination. --Small bowel: There is dilatation of the proximal small bowel which is fluid filled and quite similar in configuration to the study from 12/29/2018. There is no clear transition point but the ileum is decompressed. --Colon: There is rectosigmoid diverticulosis without acute inflammation. --Appendix: Not visualized Vascular/Lymphatic: Atherosclerotic calcification is present within the non-aneurysmal abdominal aorta, without hemodynamically significant stenosis. No abdominal or pelvic lymphadenopathy. Reproductive: Status post hysterectomy. No adnexal mass. Musculoskeletal. Grade 1 L4-5 anterolisthesis with right L4 pars interarticularis defect. Other: None. IMPRESSION: 1. No pulmonary embolus or acute aortic syndrome. 2. Small bowel obstruction with markedly dilated esophagus, stomach, duodenum and jejunum. No clear transition point is identified, but the configuration of the small bowel is similar to the study of 12/29/18. 3. Small left pleural effusion and basilar atelectasis. 4. Unchanged appearance of dilated and thickened gallbladder with large complex gallstone. 5. Aortic Atherosclerosis (ICD10-I70.0). Electronically Signed   By: Ulyses Jarred M.D.   On: 07/17/2019 05:12   CT ABDOMEN PELVIS W CONTRAST  Result Date: 07/17/2019 CLINICAL  DATA:  Generalized body aches, weakness and hypoxia EXAM: CT ANGIOGRAPHY CHEST CT ABDOMEN AND PELVIS WITH CONTRAST TECHNIQUE: Multidetector CT imaging of the chest was performed using the standard protocol during bolus administration of intravenous contrast. Multiplanar CT image reconstructions and MIPs were obtained to evaluate the vascular anatomy. Multidetector CT imaging of the abdomen and pelvis was performed using the standard protocol during bolus administration of intravenous contrast. CONTRAST:  29mL OMNIPAQUE IOHEXOL 350 MG/ML SOLN COMPARISON:  None. FINDINGS: CTA CHEST FINDINGS Cardiovascular: Contrast injection is sufficient to demonstrate satisfactory opacification of the pulmonary arteries to the segmental level. There is no pulmonary embolus. The main pulmonary artery is within normal limits for size. There is no CT evidence of acute right heart strain. There is calcific atherosclerosis of the aorta. There is a stent in the proximal left subclavian artery. There is a normal 3-vessel arch branching pattern. Heart size is normal, without pericardial effusion. Mediastinum/Nodes: The esophagus is patulous and fluid-filled. There is no mediastinal or axillary lymphadenopathy. Lungs/Pleura: Small left pleural effusion with basilar atelectasis. Left apical scarring. Musculoskeletal: Remote median sternotomy no chest wall abnormality. Review of the MIP images confirms the above findings. CT ABDOMEN and PELVIS FINDINGS Hepatobiliary: Normal hepatic contours and density. No visible biliary dilatation. Unchanged appearance of dilated and thickened gallbladder with large complex gallstone. Pancreas: Normal contours without ductal dilatation. No peripancreatic fluid collection. Spleen: Hypodense focus of the medial spleen is unchanged. Adrenals/Urinary Tract: --Adrenal glands: Normal. --Right kidney/ureter: No hydronephrosis or perinephric stranding. No nephrolithiasis. No obstructing ureteral stones. --Left  kidney/ureter: No hydronephrosis or perinephric stranding. No nephrolithiasis. No obstructing ureteral stones. --Urinary bladder: Unremarkable. Stomach/Bowel: --Stomach/Duodenum: Stomach is distended. There is marked dilatation of the duodenum, similar to the prior examination. --Small bowel: There is dilatation of the proximal small bowel which is fluid filled and quite similar in configuration to the study from 12/29/2018. There is no clear transition point but the ileum is decompressed. --Colon: There is  rectosigmoid diverticulosis without acute inflammation. --Appendix: Not visualized Vascular/Lymphatic: Atherosclerotic calcification is present within the non-aneurysmal abdominal aorta, without hemodynamically significant stenosis. No abdominal or pelvic lymphadenopathy. Reproductive: Status post hysterectomy. No adnexal mass. Musculoskeletal. Grade 1 L4-5 anterolisthesis with right L4 pars interarticularis defect. Other: None. IMPRESSION: 1. No pulmonary embolus or acute aortic syndrome. 2. Small bowel obstruction with markedly dilated esophagus, stomach, duodenum and jejunum. No clear transition point is identified, but the configuration of the small bowel is similar to the study of 12/29/18. 3. Small left pleural effusion and basilar atelectasis. 4. Unchanged appearance of dilated and thickened gallbladder with large complex gallstone. 5. Aortic Atherosclerosis (ICD10-I70.0). Electronically Signed   By: Deatra Robinson M.D.   On: 07/17/2019 05:12   DG Chest Portable 1 View  Result Date: 07/16/2019 CLINICAL DATA:  Generalized weakness EXAM: PORTABLE CHEST 1 VIEW COMPARISON:  12/28/2017 FINDINGS: Cardiac shadow is stable. Postsurgical changes are seen. Aortic calcifications are noted. Left subclavian arterial stent is seen. The overall inspiratory effort is poor with crowding of the vascular markings. No focal infiltrate or sizable effusion is seen. No bony abnormality is noted. IMPRESSION: Poor inspiratory  effort without acute abnormality. Electronically Signed   By: Alcide Clever M.D.   On: 07/16/2019 23:08   DG Abd Portable 1V-Small Bowel Obstruction Protocol-initial, 8 hr delay  Result Date: 07/17/2019 CLINICAL DATA:  Small-bowel obstruction EXAM: PORTABLE ABDOMEN - 1 VIEW COMPARISON:  Same day study FINDINGS: Nasogastric tube is seen coiled within the stomach. There is enteric contrast present throughout the colon. No dilated loops of small bowel are seen. Excreted contrast noted within the urinary bladder. IMPRESSION: No evidence of small-bowel obstruction. Oral contrast is present within the colon. Electronically Signed   By: Duanne Guess M.D.   On: 07/17/2019 22:56   DG Abd Portable 1V-Small Bowel Protocol-Position Verification  Result Date: 07/17/2019 CLINICAL DATA:  Nasogastric tube placement. EXAM: PORTABLE ABDOMEN - 1 VIEW COMPARISON:  Dec 31, 2018. FINDINGS: The bowel gas pattern is normal. Nasogastric tube tip is seen in proximal stomach. No radio-opaque calculi or other significant radiographic abnormality are seen. IMPRESSION: Nasogastric tube tip seen in proximal stomach. No evidence of bowel obstruction or ileus. Electronically Signed   By: Lupita Raider M.D.   On: 07/17/2019 09:53    Anti-infectives: Anti-infectives (From admission, onward)   Start     Dose/Rate Route Frequency Ordered Stop   07/18/19 0200  cefTRIAXone (ROCEPHIN) 1 g in sodium chloride 0.9 % 100 mL IVPB     1 g 200 mL/hr over 30 Minutes Intravenous Every 24 hours 07/17/19 0636     07/17/19 0545  metroNIDAZOLE (FLAGYL) IVPB 500 mg     500 mg 100 mL/hr over 60 Minutes Intravenous  Once 07/17/19 0540 07/17/19 0710   07/17/19 0200  cefTRIAXone (ROCEPHIN) 1 g in sodium chloride 0.9 % 100 mL IVPB     1 g 200 mL/hr over 30 Minutes Intravenous  Once 07/17/19 0153 07/17/19 0350      Assessment/Plan: SBO - resolved, D/C NGT, clears and adv as tolerated  Gallstone - chronic  LOS: 1 day    Liz Malady 07/18/2019

## 2019-07-19 ENCOUNTER — Telehealth: Payer: Self-pay | Admitting: Cardiology

## 2019-07-19 DIAGNOSIS — E876 Hypokalemia: Secondary | ICD-10-CM

## 2019-07-19 LAB — CBC
HCT: 27.7 % — ABNORMAL LOW (ref 36.0–46.0)
Hemoglobin: 8.5 g/dL — ABNORMAL LOW (ref 12.0–15.0)
MCH: 25.6 pg — ABNORMAL LOW (ref 26.0–34.0)
MCHC: 30.7 g/dL (ref 30.0–36.0)
MCV: 83.4 fL (ref 80.0–100.0)
Platelets: 126 10*3/uL — ABNORMAL LOW (ref 150–400)
RBC: 3.32 MIL/uL — ABNORMAL LOW (ref 3.87–5.11)
RDW: 15.9 % — ABNORMAL HIGH (ref 11.5–15.5)
WBC: 6 10*3/uL (ref 4.0–10.5)
nRBC: 0 % (ref 0.0–0.2)

## 2019-07-19 LAB — GLUCOSE, CAPILLARY
Glucose-Capillary: 114 mg/dL — ABNORMAL HIGH (ref 70–99)
Glucose-Capillary: 134 mg/dL — ABNORMAL HIGH (ref 70–99)
Glucose-Capillary: 145 mg/dL — ABNORMAL HIGH (ref 70–99)
Glucose-Capillary: 147 mg/dL — ABNORMAL HIGH (ref 70–99)
Glucose-Capillary: 178 mg/dL — ABNORMAL HIGH (ref 70–99)
Glucose-Capillary: 248 mg/dL — ABNORMAL HIGH (ref 70–99)

## 2019-07-19 LAB — COMPREHENSIVE METABOLIC PANEL
ALT: 9 U/L (ref 0–44)
AST: 14 U/L — ABNORMAL LOW (ref 15–41)
Albumin: 2.8 g/dL — ABNORMAL LOW (ref 3.5–5.0)
Alkaline Phosphatase: 44 U/L (ref 38–126)
Anion gap: 9 (ref 5–15)
BUN: 23 mg/dL (ref 8–23)
CO2: 26 mmol/L (ref 22–32)
Calcium: 8.1 mg/dL — ABNORMAL LOW (ref 8.9–10.3)
Chloride: 111 mmol/L (ref 98–111)
Creatinine, Ser: 1.18 mg/dL — ABNORMAL HIGH (ref 0.44–1.00)
GFR calc Af Amer: 47 mL/min — ABNORMAL LOW (ref >60)
GFR calc non Af Amer: 40 mL/min — ABNORMAL LOW (ref >60)
Glucose, Bld: 152 mg/dL — ABNORMAL HIGH (ref 70–99)
Potassium: 3.1 mmol/L — ABNORMAL LOW (ref 3.5–5.1)
Sodium: 146 mmol/L — ABNORMAL HIGH (ref 135–145)
Total Bilirubin: 0.4 mg/dL (ref 0.3–1.2)
Total Protein: 5.7 g/dL — ABNORMAL LOW (ref 6.5–8.1)

## 2019-07-19 LAB — MAGNESIUM: Magnesium: 1.6 mg/dL — ABNORMAL LOW (ref 1.7–2.4)

## 2019-07-19 LAB — URINE CULTURE: Culture: >=100000 — AB

## 2019-07-19 LAB — HEMOGLOBIN A1C
Hgb A1c MFr Bld: 7.7 % — ABNORMAL HIGH (ref 4.8–5.6)
Mean Plasma Glucose: 174.29 mg/dL

## 2019-07-19 MED ORDER — CARVEDILOL 12.5 MG PO TABS
12.5000 mg | ORAL_TABLET | Freq: Two times a day (BID) | ORAL | Status: DC
  Administered 2019-07-19 – 2019-07-20 (×3): 12.5 mg via ORAL
  Filled 2019-07-19 (×3): qty 1

## 2019-07-19 MED ORDER — INSULIN ASPART 100 UNIT/ML ~~LOC~~ SOLN
0.0000 [IU] | Freq: Three times a day (TID) | SUBCUTANEOUS | Status: DC
  Administered 2019-07-20: 1 [IU] via SUBCUTANEOUS
  Administered 2019-07-20: 3 [IU] via SUBCUTANEOUS

## 2019-07-19 MED ORDER — POTASSIUM CHLORIDE CRYS ER 20 MEQ PO TBCR
40.0000 meq | EXTENDED_RELEASE_TABLET | ORAL | Status: AC
  Administered 2019-07-19 (×2): 40 meq via ORAL
  Filled 2019-07-19 (×2): qty 2

## 2019-07-19 MED ORDER — GABAPENTIN 300 MG PO CAPS
600.0000 mg | ORAL_CAPSULE | Freq: Every day | ORAL | Status: DC
  Administered 2019-07-19: 600 mg via ORAL
  Filled 2019-07-19: qty 2

## 2019-07-19 MED ORDER — AMLODIPINE BESYLATE 10 MG PO TABS
10.0000 mg | ORAL_TABLET | Freq: Every day | ORAL | Status: DC
  Administered 2019-07-19: 10 mg via ORAL
  Filled 2019-07-19: qty 1

## 2019-07-19 MED ORDER — HYDRALAZINE HCL 25 MG PO TABS
25.0000 mg | ORAL_TABLET | Freq: Three times a day (TID) | ORAL | Status: DC
  Administered 2019-07-19 – 2019-07-20 (×4): 25 mg via ORAL
  Filled 2019-07-19 (×4): qty 1

## 2019-07-19 NOTE — Telephone Encounter (Signed)
Postpone visit 4 weeks

## 2019-07-19 NOTE — Plan of Care (Signed)
  Problem: Nutrition: Goal: Adequate nutrition will be maintained Outcome: Progressing   Problem: Coping: Goal: Level of anxiety will decrease Outcome: Progressing   

## 2019-07-19 NOTE — Progress Notes (Signed)
Pt much more alert this am and talking.   Eleanora Neighbor, RN

## 2019-07-19 NOTE — Progress Notes (Signed)
[facility]  PROGRESS NOTE  [place] M Berghuis ZOX:[id number] DOB: 01-20-1926 DOA: 07/16/2019 PCP: Alysia Penna, MD Admit date - 07/16/2019   Admitting Physician Pearson Grippe, MD  Outpatient Primary MD for the patient is Alysia Penna, MD  LOS - 2 Brief Narrative   [place] [name] is a 85 y.o. year old female with medical history significant for CAD status post CABG, HTN, type 2 diabetes, prior small bowel obstruction (hospitalized 01/2019 with concern for cholecystitis but patient declined HIDA scan , March/2020) who presented on 07/16/2019 with emesis and vomiting x1 day with associated worsening confusion reported by husband and was found to have small bowel obstruction with AMS and likely UTI.   Husband provides all relevant history, states patient has been feeling well before suddenly developing nausea and emesis episodes with worsening confusion. He brought her to the ED because he was concerned she had developed another obstruction.  He reports she had a normal bowel movement the day prior.  In the ED: T-max 98.9 blood pressure 177/68.  Lab work notable for glucose of 234, D-dimer greater than 20, BNP 278.  UA with pyuria with rare bacteria.  WBC 18.2.  Creatinine 1.61 (baseline). Chest x-ray no acute findings.  CTA obtained due to elevated D-dimer with reported hypoxia of 86% on room air in ED, it was negative for PE or any abnormal lung findings but did confirm small bowel obstruction.  NG tube was placed, surgery was consulted, patient was admitted [facility].     Subjective  Ms.  [name] today continues to improve and level of alertness.  Able to say good morning to me, denies any current abdominal pain.  No shortness of breath.  Ready to advance diet husband at bedside  A & P   Small bowel obstruction, resolved.  Repeat x-ray on SBO Gastrografin protocol shows resolution of SBO abdomen is nonacute, clinically also improved, NG tube removed  on 12/13, tolerating clear liquid --Advance to soft diet -Tramadol every 6 hours moderate pain -Tylenol every 6 hours mild pain -DC IV 0.5 mg Dilaudid every 6 hours as needed severe pain -IV Zofran as needed,  Hypoactive Delirium continues to improve in the setting of SBO and associated pain, UTI and hypernatremia.  CT head negative for acute abnormalities.  TSH, ammonia,Electrolytes within normal limits per husband baseline is functional and independent of ADLs and corroborated on previous documentation from last hospital stay in 01/2019.  Follows command on my exam, alert to self only -Continue to monitor BMP -Delirium precautions -Closely monitor  Hypernatremia, improving in the setting of diminished p.o. intake.  Improved with D5 half-normal saline -Advance to soft diet, repeat BMP in a.m., if able to maintain normal without IV fluids anticipate discharge in 24 hours -Monitor  Hypokalemia.  Not having any emesis episodes here. -Supplement oral -trend BMP  Elevated D-dimer.  Likely result of inflammatory marker increased from SBO.  CTA negative for PE and patient has no O2 requirements -Closely monitor  Klebsiella UTI Urine culture positive for Klebsiella,  --Completed 3-day course of IV ceftriaxone  Leukocytosis, resolved.  Suspect stress leukocytosis in the setting of SBO, but also could be related to likely UTI.  Patient remains afebrile, chest x-ray/CT chest/abdomen with no other overt infectious process.  Blood cultures remain negative  Dilated and thickened gall bladder with large complex gallstone, chronic. Unchanged from 12/2018. Surgery does not think this is cause of acute problems. Patient declined HIDA scan last admission in 01/2019 LFTs/T bilirubin  wnl. No visibile biliary dilatation on CT imaging, LFTs and T bilirubin wnl -received flagyl in ED, surgery recommends not continuing --closely monitor   High-sensitivity troponin.  Initial level 36--6. Not consistent with ACS  given decrease in 2 hours. No symptoms and no acute ischemic changes on EKG. TTE without wall motion abnormalities, preserved EF  CKD, stage III, stable.  Most recent creatinine was 01/2019 1.6, currently remained stable -Avoid nephrotoxins, monitor BMP, monitor urine output  Normocytic anemia, chronic.  Hemoglobin stable -Daily CBC  Hypertension, still elevated to 160s.  In setting of not been able to tolerate p.o.  -Resume home amlodipine, carvedilol, hydralazine  -IV hydralazine as needed,  -DC IV hydralazine 10 mg scheduled q8 Hrs,   Hyperlipidemia, stable.  --Hold home statin given reported myalgias  Type 2 diabetes with peripheral neuropathy.  A1c. 7.5(12/2018) -Sliding scale as needed-monitor CBGs -Resume home gabapentin   CAD.  History of CABG.  No obvious cardiac symptoms. -Resume home aspirin   -Closely monitor.    Family Communication  : Husband updated at bedside  Code Status : Full code, confirmed by husband on day of admission  Disposition Plan  : Advance to soft diet, repeat BMP in a.m., if able to maintain normal sodium without IV fluids anticipate discharge in 24 hours  Consults  : Surgery  Procedures  : TTE 12/14  DVT Prophylaxis  :  Lovenox  Lab Results  Component Value Date   PLT 126 (L) 07/19/2019    Diet :  Diet Order            DIET SOFT Room service appropriate? Yes; Fluid consistency: Thin  Diet effective now               Inpatient Medications Scheduled Meds: . amLODipine  10 mg Oral QHS  . aspirin EC  81 mg Oral QHS  . carvedilol  12.5 mg Oral BID WC  . enoxaparin (LOVENOX) injection  30 mg Subcutaneous Q24H  . gabapentin  600 mg Oral QHS  . hydrALAZINE  25 mg Oral TID  . insulin aspart  0-9 Units Subcutaneous Q4H   Continuous Infusions:  PRN Meds:.acetaminophen **OR** acetaminophen, hydrALAZINE, HYDROmorphone (DILAUDID) injection, ondansetron (ZOFRAN) IV  Antibiotics  :   Anti-infectives (From admission, onward)   Start      Dose/Rate Route Frequency Ordered Stop   07/18/19 0200  cefTRIAXone (ROCEPHIN) 1 g in sodium chloride 0.9 % 100 mL IVPB  Status:  Discontinued     1 g 200 mL/hr over 30 Minutes Intravenous Every 24 hours 07/17/19 0636 07/19/19 0857   07/17/19 0545  metroNIDAZOLE (FLAGYL) IVPB 500 mg     500 mg 100 mL/hr over 60 Minutes Intravenous  Once 07/17/19 0540 07/17/19 0710   07/17/19 0200  cefTRIAXone (ROCEPHIN) 1 g in sodium chloride 0.9 % 100 mL IVPB     1 g 200 mL/hr over 30 Minutes Intravenous  Once 07/17/19 0153 07/17/19 0350       Objective   Vitals:   07/18/19 1616 07/18/19 1959 07/19/19 0411 07/19/19 0754  BP: (!) 168/60 (!) 166/61 (!) 167/54 (!) 159/52  Pulse: 66 61 63 67  Resp: Temp: 99.3 F (37.4 C) 98.2 F (36.8 C) (!) 97.5 F (36.4 C) 98.6 F (37 C)  TempSrc: Oral Oral Oral Oral  SpO2: 95% 94% 99% 99%    SpO2: 99 % O2 Flow Rate (L/min): 2 L/min  Wt Readings from Last 3 Encounters:  01/05/19  64.3 kg  12/03/18 59 kg  12/28/17 59.4 kg     Intake/Output Summary (Last 24 hours) at 07/19/2019 1309 Last data filed at 07/19/2019 0941 Gross per 24 hour  Intake 679.33 ml  Output 500 ml  Net 179.33 ml    Physical Exam:  Sitting up in bed comfortably Following commands, alert to self/husband only Regular rate and rhythm, systolic ejection murmur present, no edema Abdomen soft, nondistended, nontender, minimal bowel sounds No appreciable rashes Lungs clear bilaterally, on room air, normal respiratory effort    I have personally reviewed the following:   Data Reviewed:  CBC Recent Labs  Lab 07/16/19 2229 07/18/19 0509 07/19/19 0611  WBC 18.2* 10.0 6.0  HGB 11.3* 8.9* 8.5*  HCT 36.4 28.6* 27.7*  PLT 173 143* 126*  MCV 83.7 83.6 83.4  MCH 26.0 26.0 25.6*  MCHC 31.0 31.1 30.7  RDW 15.8* 16.0* 15.9*  LYMPHSABS 0.8  --   --   MONOABS 1.1*  --   --   EOSABS 0.0  --   --   BASOSABS 0.0  --   --     Chemistries  Recent Labs  Lab  07/16/19 2229 07/18/19 0509 07/19/19 0611  NA 144 149* 146*  K 3.8 3.1* 3.1*  CL 102 110 111  CO2 GLUCOSE 284* 156* 152*  BUN 42* 34* 23  CREATININE 1.61* 1.42* 1.18*  CALCIUM 9.2 8.3* 8.1*  AST 16 15 14*  ALT ALKPHOS 67 47 44  BILITOT 1.0 0.8 0.4   ------------------------------------------------------------------------------------------------------------------ No results for input(s): CHOL, HDL, LDLCALC, TRIG, CHOLHDL, LDLDIRECT in the last 72 hours.  Lab Results  Component Value Date   HGBA1C 7.5 (H) 12/30/2018   ------------------------------------------------------------------------------------------------------------------ Recent Labs    07/16/19 2240  TSH 3.156   ------------------------------------------------------------------------------------------------------------------ No results for input(s): VITAMINB12, FOLATE, FERRITIN, TIBC, IRON, RETICCTPCT in the last 72 hours.  Coagulation profile Recent Labs  Lab 07/16/19 2229  INR 1.1    Recent Labs    07/16/19 2331  DDIMER >20.00*    Cardiac Enzymes No results for input(s): CKMB, TROPONINI, MYOGLOBIN in the last 168 hours.  Invalid input(s): CK ------------------------------------------------------------------------------------------------------------------    Component Value Date/Time   BNP 278.7 (H) 07/16/2019 2240    Micro Results Recent Results (from the past 240 hour(s))  Blood culture (routine x 2)     Status: None (Preliminary result)   Collection Time: 07/16/19 11:10 PM   Specimen: BLOOD LEFT WRIST  Result Value Ref Range Status   Specimen Description BLOOD LEFT WRIST  Final   Special Requests   Final    BOTTLES DRAWN AEROBIC AND ANAEROBIC Blood Culture results may not be optimal due to an inadequate volume of blood received in culture bottles   Culture   Final    NO GROWTH 2 DAYS Performed at [facility], [address], [place], [place] [address]    Report  Status PENDING  Incomplete  Urine culture     Status: Abnormal   Collection Time: 07/16/19 11:20 PM   Specimen: Urine, Random  Result Value Ref Range Status   Specimen Description URINE, RANDOM  Final   Special Requests   Final    NONE Performed at [facility], [address], [place], [place] [address]    Culture >=100,000 COLONIES/mL KLEBSIELLA PNEUMONIAE (A)  Final   Report Status 07/19/2019 FINAL  Final   Organism ID, Bacteria KLEBSIELLA PNEUMONIAE (A)  Final  Susceptibility   Klebsiella pneumoniae - MIC*    AMPICILLIN >=32 RESISTANT Resistant     CEFAZOLIN <=4 SENSITIVE Sensitive     CEFTRIAXONE <=1 SENSITIVE Sensitive     CIPROFLOXACIN <=0.25 SENSITIVE Sensitive     GENTAMICIN <=1 SENSITIVE Sensitive     IMIPENEM <=0.25 SENSITIVE Sensitive     NITROFURANTOIN 128 RESISTANT Resistant     TRIMETH/SULFA <=20 SENSITIVE Sensitive     AMPICILLIN/SULBACTAM 8 SENSITIVE Sensitive     PIP/TAZO <=4 SENSITIVE Sensitive     * >=100,000 COLONIES/mL KLEBSIELLA PNEUMONIAE  SARS CORONAVIRUS 2 (TAT 6-24 HRS) Nasopharyngeal Nasopharyngeal Swab     Status: None   Collection Time: 07/16/19 11:20 PM   Specimen: Nasopharyngeal Swab  Result Value Ref Range Status   SARS Coronavirus 2 NEGATIVE NEGATIVE Final    Comment: (NOTE) SARS-CoV-2 target nucleic acids are NOT DETECTED. The SARS-CoV-2 RNA is generally detectable in upper and lower respiratory specimens during the acute phase of infection. Negative results do not preclude SARS-CoV-2 infection, do not rule out co-infections with other pathogens, and should not be used as the sole basis for treatment or other patient management decisions. Negative results must be combined with clinical observations, patient history, and epidemiological information. The expected result is Negative. Fact Sheet for Patients: HairSlick.no Fact Sheet for Healthcare  Providers: quierodirigir.com This test is not yet approved or cleared by the [place] FDA and  has been authorized for detection and/or diagnosis of SARS-CoV-2 by FDA under an Emergency Use Authorization (EUA). This EUA will remain  in effect (meaning this test can be used) for the duration of the COVID-19 declaration under Section 56 4(b)(1) of the Act, 21 U.S.C. section 360bbb-3(b)(1), unless the authorization is terminated or revoked sooner. Performed at [facility], [address] [place], [place] [address]   Blood culture (routine x 2)     Status: None (Preliminary result)   Collection Time: 07/17/19  1:54 AM   Specimen: Site Not Specified; Blood  Result Value Ref Range Status   Specimen Description SITE NOT SPECIFIED  Final   Special Requests   Final    AEROBIC BOTTLE ONLY Blood Culture results may not be optimal due to an inadequate volume of blood received in culture bottles   Culture   Final    NO GROWTH 2 DAYS Performed at [facility], [address] [place], [place] [address]    Report Status PENDING  Incomplete    Radiology Reports CT Head Wo Contrast  Result Date: 07/17/2019 CLINICAL DATA:  Encephalopathy EXAM: CT HEAD WITHOUT CONTRAST TECHNIQUE: Contiguous axial images were obtained from the base of the skull through the vertex without intravenous contrast. COMPARISON:  Head CT 12/03/2018 FINDINGS: Brain: There is no mass, hemorrhage or extra-axial collection. The size and configuration of the ventricles and extra-axial CSF spaces are normal. There is hypoattenuation of the white matter, most commonly indicating chronic small vessel disease. Vascular: Atherosclerotic calcification of the internal carotid arteries at the skull base. No abnormal hyperdensity of the major intracranial arteries or dural venous sinuses. Skull: The visualized skull base, calvarium and extracranial soft tissues are normal. Sinuses/Orbits: No fluid  levels or advanced mucosal thickening of the visualized paranasal sinuses. No mastoid or middle ear effusion. The orbits are normal. IMPRESSION: Findings of chronic small vessel disease without acute intracranial abnormality. Electronically Signed   By: Deatra Robinson M.D.   On: 07/17/2019 04:56   CT Angio Chest PE W and/or Wo Contrast  Result Date: 07/17/2019 CLINICAL DATA:  Generalized body aches, weakness and hypoxia EXAM: CT ANGIOGRAPHY CHEST CT ABDOMEN AND PELVIS WITH CONTRAST TECHNIQUE: Multidetector CT imaging of the chest was performed using the standard protocol during bolus administration of intravenous contrast. Multiplanar CT image reconstructions and MIPs were obtained to evaluate the vascular anatomy. Multidetector CT imaging of the abdomen and pelvis was performed using the standard protocol during bolus administration of intravenous contrast. CONTRAST:  66mL OMNIPAQUE IOHEXOL 350 MG/ML SOLN COMPARISON:  None. FINDINGS: CTA CHEST FINDINGS Cardiovascular: Contrast injection is sufficient to demonstrate satisfactory opacification of the pulmonary arteries to the segmental level. There is no pulmonary embolus. The main pulmonary artery is within normal limits for size. There is no CT evidence of acute right heart strain. There is calcific atherosclerosis of the aorta. There is a stent in the proximal left subclavian artery. There is a normal 3-vessel arch branching pattern. Heart size is normal, without pericardial effusion. Mediastinum/Nodes: The esophagus is patulous and fluid-filled. There is no mediastinal or axillary lymphadenopathy. Lungs/Pleura: Small left pleural effusion with basilar atelectasis. Left apical scarring. Musculoskeletal: Remote median sternotomy no chest wall abnormality. Review of the MIP images confirms the above findings. CT ABDOMEN and PELVIS FINDINGS Hepatobiliary: Normal hepatic contours and density. No visible biliary dilatation. Unchanged appearance of dilated and  thickened gallbladder with large complex gallstone. Pancreas: Normal contours without ductal dilatation. No peripancreatic fluid collection. Spleen: Hypodense focus of the medial spleen is unchanged. Adrenals/Urinary Tract: --Adrenal glands: Normal. --Right kidney/ureter: No hydronephrosis or perinephric stranding. No nephrolithiasis. No obstructing ureteral stones. --Left kidney/ureter: No hydronephrosis or perinephric stranding. No nephrolithiasis. No obstructing ureteral stones. --Urinary bladder: Unremarkable. Stomach/Bowel: --Stomach/Duodenum: Stomach is distended. There is marked dilatation of the duodenum, similar to the prior examination. --Small bowel: There is dilatation of the proximal small bowel which is fluid filled and quite similar in configuration to the study from 12/29/2018. There is no clear transition point but the ileum is decompressed. --Colon: There is rectosigmoid diverticulosis without acute inflammation. --Appendix: Not visualized Vascular/Lymphatic: Atherosclerotic calcification is present within the non-aneurysmal abdominal aorta, without hemodynamically significant stenosis. No abdominal or pelvic lymphadenopathy. Reproductive: Status post hysterectomy. No adnexal mass. Musculoskeletal. Grade 1 L4-5 anterolisthesis with right L4 pars interarticularis defect. Other: None. IMPRESSION: 1. No pulmonary embolus or acute aortic syndrome. 2. Small bowel obstruction with markedly dilated esophagus, stomach, duodenum and jejunum. No clear transition point is identified, but the configuration of the small bowel is similar to the study of 12/29/18. 3. Small left pleural effusion and basilar atelectasis. 4. Unchanged appearance of dilated and thickened gallbladder with large complex gallstone. 5. Aortic Atherosclerosis (ICD10-I70.0). Electronically Signed   By: Deatra Robinson M.D.   On: 07/17/2019 05:12   CT ABDOMEN PELVIS W CONTRAST  Result Date: 07/17/2019 CLINICAL DATA:  Generalized body  aches, weakness and hypoxia EXAM: CT ANGIOGRAPHY CHEST CT ABDOMEN AND PELVIS WITH CONTRAST TECHNIQUE: Multidetector CT imaging of the chest was performed using the standard protocol during bolus administration of intravenous contrast. Multiplanar CT image reconstructions and MIPs were obtained to evaluate the vascular anatomy. Multidetector CT imaging of the abdomen and pelvis was performed using the standard protocol during bolus administration of intravenous contrast. CONTRAST:  69mL OMNIPAQUE IOHEXOL 350 MG/ML SOLN COMPARISON:  None. FINDINGS: CTA CHEST FINDINGS Cardiovascular: Contrast injection is sufficient to demonstrate satisfactory opacification of the pulmonary arteries to the segmental level. There is no pulmonary embolus. The main pulmonary artery is within normal limits for size. There is no CT  evidence of acute right heart strain. There is calcific atherosclerosis of the aorta. There is a stent in the proximal left subclavian artery. There is a normal 3-vessel arch branching pattern. Heart size is normal, without pericardial effusion. Mediastinum/Nodes: The esophagus is patulous and fluid-filled. There is no mediastinal or axillary lymphadenopathy. Lungs/Pleura: Small left pleural effusion with basilar atelectasis. Left apical scarring. Musculoskeletal: Remote median sternotomy no chest wall abnormality. Review of the MIP images confirms the above findings. CT ABDOMEN and PELVIS FINDINGS Hepatobiliary: Normal hepatic contours and density. No visible biliary dilatation. Unchanged appearance of dilated and thickened gallbladder with large complex gallstone. Pancreas: Normal contours without ductal dilatation. No peripancreatic fluid collection. Spleen: Hypodense focus of the medial spleen is unchanged. Adrenals/Urinary Tract: --Adrenal glands: Normal. --Right kidney/ureter: No hydronephrosis or perinephric stranding. No nephrolithiasis. No obstructing ureteral stones. --Left kidney/ureter: No  hydronephrosis or perinephric stranding. No nephrolithiasis. No obstructing ureteral stones. --Urinary bladder: Unremarkable. Stomach/Bowel: --Stomach/Duodenum: Stomach is distended. There is marked dilatation of the duodenum, similar to the prior examination. --Small bowel: There is dilatation of the proximal small bowel which is fluid filled and quite similar in configuration to the study from 12/29/2018. There is no clear transition point but the ileum is decompressed. --Colon: There is rectosigmoid diverticulosis without acute inflammation. --Appendix: Not visualized Vascular/Lymphatic: Atherosclerotic calcification is present within the non-aneurysmal abdominal aorta, without hemodynamically significant stenosis. No abdominal or pelvic lymphadenopathy. Reproductive: Status post hysterectomy. No adnexal mass. Musculoskeletal. Grade 1 L4-5 anterolisthesis with right L4 pars interarticularis defect. Other: None. IMPRESSION: 1. No pulmonary embolus or acute aortic syndrome. 2. Small bowel obstruction with markedly dilated esophagus, stomach, duodenum and jejunum. No clear transition point is identified, but the configuration of the small bowel is similar to the study of 12/29/18. 3. Small left pleural effusion and basilar atelectasis. 4. Unchanged appearance of dilated and thickened gallbladder with large complex gallstone. 5. Aortic Atherosclerosis (ICD10-I70.0). Electronically Signed   By: Deatra Robinson M.D.   On: 07/17/2019 05:12   DG Chest Portable 1 View  Result Date: 07/16/2019 CLINICAL DATA:  Generalized weakness EXAM: PORTABLE CHEST 1 VIEW COMPARISON:  12/28/2017 FINDINGS: Cardiac shadow is stable. Postsurgical changes are seen. Aortic calcifications are noted. Left subclavian arterial stent is seen. The overall inspiratory effort is poor with crowding of the vascular markings. No focal infiltrate or sizable effusion is seen. No bony abnormality is noted. IMPRESSION: Poor inspiratory effort without  acute abnormality. Electronically Signed   By: Alcide Clever M.D.   On: 07/16/2019 23:08   DG Abd Portable 1V-Small Bowel Obstruction Protocol-initial, 8 hr delay  Result Date: 07/17/2019 CLINICAL DATA:  Small-bowel obstruction EXAM: PORTABLE ABDOMEN - 1 VIEW COMPARISON:  Same day study FINDINGS: Nasogastric tube is seen coiled within the stomach. There is enteric contrast present throughout the colon. No dilated loops of small bowel are seen. Excreted contrast noted within the urinary bladder. IMPRESSION: No evidence of small-bowel obstruction. Oral contrast is present within the colon. Electronically Signed   By: Duanne Guess M.D.   On: 07/17/2019 22:56   DG Abd Portable 1V-Small Bowel Protocol-Position Verification  Result Date: 07/17/2019 CLINICAL DATA:  Nasogastric tube placement. EXAM: PORTABLE ABDOMEN - 1 VIEW COMPARISON:  Dec 31, 2018. FINDINGS: The bowel gas pattern is normal. Nasogastric tube tip is seen in proximal stomach. No radio-opaque calculi or other significant radiographic abnormality are seen. IMPRESSION: Nasogastric tube tip seen in proximal stomach. No evidence of bowel obstruction or ileus. Electronically Signed   By: Roque Lias  Jr M.D.   On: 07/17/2019 09:53   ECHOCARDIOGRAM COMPLETE  Result Date: 07/18/2019   ECHOCARDIOGRAM REPORT   Patient Name:   [place] Date of Exam: 07/18/2019 Medical Rec #:  [id number]              Height:       62.0 in Accession #:    [id number]             Weight:       141.6 lb Date of Birth:  [birth year]              BSA:          1.65 m Patient Age:    89 years               BP:           168/60 mmHg Patient Gender: F                      HR:           66 bpm. Exam Location:  Inpatient Procedure: 2D Echo Indications:    Elevated troponin  History:        Patient has prior history of Echocardiogram examinations, most                 recent 03/19/2013. Risk Factors:Hypertension and Diabetes. CKD                 LBBB (left bundle branch  block).  Sonographer:    Leda Minhelsea Turrentine Referring Phys: [phone] Pearson GrippeJAMES KIM  Sonographer Comments: Technically difficult study due to poor patient compliance. Patient proceeded to sit up and try to lay supine on tech's arm. Unable to assess further images due to patient position. IMPRESSIONS  1. Patient refused proper positioning for this study. Limited images were obtrained. Not all cardiac structures fully examined due to this.  2. Left ventricular ejection fraction, by visual estimation, is 55 to 60%. The left ventricle has normal function. There is borderline left ventricular hypertrophy.  3. Abnormal septal motion consistent with left bundle branch block.  4. Left ventricular diastolic function could not be evaluated.  5. The left ventricle has no regional wall motion abnormalities.  6. Global right ventricle has normal systolic function.The right ventricular size is normal. No increase in right ventricular wall thickness.  7. Left atrial size was not well visualized.  8. Right atrial size was not well visualized.  9. Trivial pericardial effusion is present. 10. The mitral valve is degenerative. Trivial mitral valve regurgitation. 11. The tricuspid valve is grossly normal. Tricuspid valve regurgitation is mild. 12. The aortic valve was not well visualized. Aortic valve regurgitation is not visualized. 13. The pulmonic valve was grossly normal. Pulmonic valve regurgitation is not visualized. 14. Mild plaque invoving the aortic root. 15. The interatrial septum was not well visualized. FINDINGS  Left Ventricle: Left ventricular ejection fraction, by visual estimation, is 55 to 60%. The left ventricle has normal function. The left ventricle has no regional wall motion abnormalities. The left ventricular internal cavity size was the left ventricle is normal in size. There is borderline left ventricular hypertrophy. Concentric left ventricular hypertrophy. Abnormal (paradoxical) septal motion, consistent with left  bundle branch block. Left ventricular diastolic function could not be evaluated. Right Ventricle: The right ventricular size is normal. No increase in right ventricular wall thickness. Global RV systolic function is has normal systolic function. Left Atrium: Left atrial size was  not well visualized. Right Atrium: Right atrial size was not well visualized Pericardium: Trivial pericardial effusion is present. Mitral Valve: The mitral valve is degenerative in appearance. Trivial mitral valve regurgitation. Tricuspid Valve: The tricuspid valve is grossly normal. Tricuspid valve regurgitation is mild. Aortic Valve: The aortic valve was not well visualized. Aortic valve regurgitation is not visualized. Mild to moderate aortic valve annular calcification. There is moderate calcification of the aortic valve. Pulmonic Valve: The pulmonic valve was grossly normal. Pulmonic valve regurgitation is not visualized. Pulmonic regurgitation is not visualized. Aorta: The aortic root is normal in size and structure. There is mild, layered plaque involving the aortic root. IAS/Shunts: The interatrial septum was not well visualized.  LEFT VENTRICLE PLAX 2D LVIDd:         4.20 cm  Diastology LVIDs:         2.70 cm  LV e' medial:   4.79 cm/s LV PW:         1.10 cm  LV E/e' medial: 21.5 LV IVS:        1.10 cm LVOT diam:     1.80 cm LV SV:         52 ml LV SV Index:   30.48 LVOT Area:     2.54 cm  LEFT ATRIUM         Index LA diam:    4.00 cm 2.42 cm/m   AORTA Ao Root diam: 2.90 cm MITRAL VALVE                         TRICUSPID VALVE MV Area (PHT): 4.49 cm              TR Peak grad:   42.2 mmHg MV PHT:        49.01 msec            TR Vmax:        325.00 cm/s MV Decel Time: 169 msec MV E velocity: 103.00 cm/s 103 cm/s  SHUNTS MV A velocity: 102.00 cm/s 70.3 cm/s Systemic Diam: 1.80 cm MV E/A ratio:  1.01        1.5  Eleonore Chiquito MD Electronically signed by Eleonore Chiquito MD Signature Date/Time: 07/18/2019/5:36:36 PM    Final      Time  Spent in minutes  30     Desiree Hane M.D on 07/19/2019 at 1:09 PM  To page go to [email or web address] - password [facility]

## 2019-07-19 NOTE — Progress Notes (Signed)
Subjective/Chief Complaint: No complaints, much more awake, having bowel function   Objective: Vital signs in last 24 hours: Temp:  [97.5 F (36.4 C)-100 F (37.8 C)] 98.6 F (37 C) (12/14 0754) Pulse Rate:  [61-67] 67 (12/14 0754) Resp:  [16-18] 16 (12/14 0754) BP: (159-168)/(52-61) 159/52 (12/14 0754) SpO2:  [94 %-99 %] 99 % (12/14 0754) Last BM Date: 07/18/19  Intake/Output from previous day: 12/13 0701 - 12/14 0700 In: 319.3 [I.V.:319.3] Out: 500 [Urine:500] Intake/Output this shift: Total I/O In: 120 [P.O.:120] Out: -   GI: soft nontender bs present  Lab Results:  Recent Labs    07/18/19 0509 07/19/19 0611  WBC 10.0 6.0  HGB 8.9* 8.5*  HCT 28.6* 27.7*  PLT 143* 126*   BMET Recent Labs    07/18/19 0509 07/19/19 0611  NA 149* 146*  K 3.1* 3.1*  CL 110 111  CO2 25 26  GLUCOSE 156* 152*  BUN 34* 23  CREATININE 1.42* 1.18*  CALCIUM 8.3* 8.1*   PT/INR Recent Labs    07/16/19 2229  LABPROT 14.0  INR 1.1   ABG No results for input(s): PHART, HCO3 in the last 72 hours.  Invalid input(s): PCO2, PO2  Studies/Results: DG Abd Portable 1V-Small Bowel Obstruction Protocol-initial, 8 hr delay  Result Date: 07/17/2019 CLINICAL DATA:  Small-bowel obstruction EXAM: PORTABLE ABDOMEN - 1 VIEW COMPARISON:  Same day study FINDINGS: Nasogastric tube is seen coiled within the stomach. There is enteric contrast present throughout the colon. No dilated loops of small bowel are seen. Excreted contrast noted within the urinary bladder. IMPRESSION: No evidence of small-bowel obstruction. Oral contrast is present within the colon. Electronically Signed   By: Duanne GuessNicholas  Plundo M.D.   On: 07/17/2019 22:56   DG Abd Portable 1V-Small Bowel Protocol-Position Verification  Result Date: 07/17/2019 CLINICAL DATA:  Nasogastric tube placement. EXAM: PORTABLE ABDOMEN - 1 VIEW COMPARISON:  Dec 31, 2018. FINDINGS: The bowel gas pattern is normal. Nasogastric tube tip is seen in  proximal stomach. No radio-opaque calculi or other significant radiographic abnormality are seen. IMPRESSION: Nasogastric tube tip seen in proximal stomach. No evidence of bowel obstruction or ileus. Electronically Signed   By: Lupita RaiderJames  Green Jr M.D.   On: 07/17/2019 09:53   ECHOCARDIOGRAM COMPLETE  Result Date: 07/18/2019   ECHOCARDIOGRAM REPORT   Patient Name:   [place] Date of Exam: 07/18/2019 Medical Rec #:  [id number]              Height:       62.0 in Accession #:    [id number]             Weight:       141.6 lb Date of Birth:  [birth year]              BSA:          1.65 m Patient Age:    85 years               BP:           168/60 mmHg Patient Gender: F                      HR:           66 bpm. Exam Location:  Inpatient Procedure: 2D Echo Indications:    Elevated troponin  History:        Patient has prior history of Echocardiogram examinations, most  recent 03/19/2013. Risk Factors:Hypertension and Diabetes. CKD                 LBBB (left bundle branch block).  Sonographer:    Leda Min Referring Phys: [phone] Pearson Grippe  Sonographer Comments: Technically difficult study due to poor patient compliance. Patient proceeded to sit up and try to lay supine on tech's arm. Unable to assess further images due to patient position. IMPRESSIONS  1. Patient refused proper positioning for this study. Limited images were obtrained. Not all cardiac structures fully examined due to this.  2. Left ventricular ejection fraction, by visual estimation, is 55 to 60%. The left ventricle has normal function. There is borderline left ventricular hypertrophy.  3. Abnormal septal motion consistent with left bundle branch block.  4. Left ventricular diastolic function could not be evaluated.  5. The left ventricle has no regional wall motion abnormalities.  6. Global right ventricle has normal systolic function.The right ventricular size is normal. No increase in right ventricular wall thickness.   7. Left atrial size was not well visualized.  8. Right atrial size was not well visualized.  9. Trivial pericardial effusion is present. 10. The mitral valve is degenerative. Trivial mitral valve regurgitation. 11. The tricuspid valve is grossly normal. Tricuspid valve regurgitation is mild. 12. The aortic valve was not well visualized. Aortic valve regurgitation is not visualized. 13. The pulmonic valve was grossly normal. Pulmonic valve regurgitation is not visualized. 14. Mild plaque invoving the aortic root. 15. The interatrial septum was not well visualized. FINDINGS  Left Ventricle: Left ventricular ejection fraction, by visual estimation, is 55 to 60%. The left ventricle has normal function. The left ventricle has no regional wall motion abnormalities. The left ventricular internal cavity size was the left ventricle is normal in size. There is borderline left ventricular hypertrophy. Concentric left ventricular hypertrophy. Abnormal (paradoxical) septal motion, consistent with left bundle branch block. Left ventricular diastolic function could not be evaluated. Right Ventricle: The right ventricular size is normal. No increase in right ventricular wall thickness. Global RV systolic function is has normal systolic function. Left Atrium: Left atrial size was not well visualized. Right Atrium: Right atrial size was not well visualized Pericardium: Trivial pericardial effusion is present. Mitral Valve: The mitral valve is degenerative in appearance. Trivial mitral valve regurgitation. Tricuspid Valve: The tricuspid valve is grossly normal. Tricuspid valve regurgitation is mild. Aortic Valve: The aortic valve was not well visualized. Aortic valve regurgitation is not visualized. Mild to moderate aortic valve annular calcification. There is moderate calcification of the aortic valve. Pulmonic Valve: The pulmonic valve was grossly normal. Pulmonic valve regurgitation is not visualized. Pulmonic regurgitation is not  visualized. Aorta: The aortic root is normal in size and structure. There is mild, layered plaque involving the aortic root. IAS/Shunts: The interatrial septum was not well visualized.  LEFT VENTRICLE PLAX 2D LVIDd:         4.20 cm  Diastology LVIDs:         2.70 cm  LV e' medial:   4.79 cm/s LV PW:         1.10 cm  LV E/e' medial: 21.5 LV IVS:        1.10 cm LVOT diam:     1.80 cm LV SV:         52 ml LV SV Index:   30.48 LVOT Area:     2.54 cm  LEFT ATRIUM         Index LA diam:  4.00 cm 2.42 cm/m   AORTA Ao Root diam: 2.90 cm MITRAL VALVE                         TRICUSPID VALVE MV Area (PHT): 4.49 cm              TR Peak grad:   42.2 mmHg MV PHT:        49.01 msec            TR Vmax:        325.00 cm/s MV Decel Time: 169 msec MV E velocity: 103.00 cm/s 103 cm/s  SHUNTS MV A velocity: 102.00 cm/s 70.3 cm/s Systemic Diam: 1.80 cm MV E/A ratio:  1.01        1.5  Eleonore Chiquito MD Electronically signed by Eleonore Chiquito MD Signature Date/Time: 07/18/2019/5:36:36 PM    Final     Anti-infectives: Anti-infectives (From admission, onward)   Start     Dose/Rate Route Frequency Ordered Stop   07/18/19 0200  cefTRIAXone (ROCEPHIN) 1 g in sodium chloride 0.9 % 100 mL IVPB  Status:  Discontinued     1 g 200 mL/hr over 30 Minutes Intravenous Every 24 hours 07/17/19 0636 07/19/19 0857   07/17/19 0545  metroNIDAZOLE (FLAGYL) IVPB 500 mg     500 mg 100 mL/hr over 60 Minutes Intravenous  Once 07/17/19 0540 07/17/19 0710   07/17/19 0200  cefTRIAXone (ROCEPHIN) 1 g in sodium chloride 0.9 % 100 mL IVPB     1 g 200 mL/hr over 30 Minutes Intravenous  Once 07/17/19 0153 07/17/19 0350      Assessment/Plan: SBO-resolved clinically and radiologically, soft diet, dc today from surgical standpoint, will sign off Gallstones- asx, has been present for some time, with her comorbidities and age would not treat  Rolm Bookbinder 07/19/2019

## 2019-07-19 NOTE — Plan of Care (Signed)
  Problem: Clinical Measurements: Goal: Will remain free from infection Outcome: Progressing Note: Pt has not shown any new signs of infection during my care.    Problem: Safety: Goal: Ability to remain free from injury will improve Outcome: Progressing Note: Pt has remained safe with call bell within reach during  my care.    Problem: Nutrition: Goal: Adequate nutrition will be maintained Outcome: Not Progressing Note: Pt continues to have a poor appetite during my shift. Pt encouraged to eat and drink.

## 2019-07-20 DIAGNOSIS — R778 Other specified abnormalities of plasma proteins: Secondary | ICD-10-CM

## 2019-07-20 DIAGNOSIS — I2510 Atherosclerotic heart disease of native coronary artery without angina pectoris: Secondary | ICD-10-CM

## 2019-07-20 DIAGNOSIS — Z0189 Encounter for other specified special examinations: Secondary | ICD-10-CM

## 2019-07-20 DIAGNOSIS — R7989 Other specified abnormal findings of blood chemistry: Secondary | ICD-10-CM

## 2019-07-20 DIAGNOSIS — K828 Other specified diseases of gallbladder: Secondary | ICD-10-CM

## 2019-07-20 DIAGNOSIS — K56609 Unspecified intestinal obstruction, unspecified as to partial versus complete obstruction: Principal | ICD-10-CM

## 2019-07-20 DIAGNOSIS — D72829 Elevated white blood cell count, unspecified: Secondary | ICD-10-CM

## 2019-07-20 LAB — CBC
HCT: 27.5 % — ABNORMAL LOW (ref 36.0–46.0)
Hemoglobin: 8.6 g/dL — ABNORMAL LOW (ref 12.0–15.0)
MCH: 26 pg (ref 26.0–34.0)
MCHC: 31.3 g/dL (ref 30.0–36.0)
MCV: 83.1 fL (ref 80.0–100.0)
Platelets: 123 10*3/uL — ABNORMAL LOW (ref 150–400)
RBC: 3.31 MIL/uL — ABNORMAL LOW (ref 3.87–5.11)
RDW: 15.8 % — ABNORMAL HIGH (ref 11.5–15.5)
WBC: 6 10*3/uL (ref 4.0–10.5)
nRBC: 0 % (ref 0.0–0.2)

## 2019-07-20 LAB — COMPREHENSIVE METABOLIC PANEL
ALT: 10 U/L (ref 0–44)
AST: 14 U/L — ABNORMAL LOW (ref 15–41)
Albumin: 2.7 g/dL — ABNORMAL LOW (ref 3.5–5.0)
Alkaline Phosphatase: 42 U/L (ref 38–126)
Anion gap: 7 (ref 5–15)
BUN: 18 mg/dL (ref 8–23)
CO2: 25 mmol/L (ref 22–32)
Calcium: 7.7 mg/dL — ABNORMAL LOW (ref 8.9–10.3)
Chloride: 111 mmol/L (ref 98–111)
Creatinine, Ser: 1.32 mg/dL — ABNORMAL HIGH (ref 0.44–1.00)
GFR calc Af Amer: 41 mL/min — ABNORMAL LOW (ref >60)
GFR calc non Af Amer: 35 mL/min — ABNORMAL LOW (ref >60)
Glucose, Bld: 143 mg/dL — ABNORMAL HIGH (ref 70–99)
Potassium: 4.4 mmol/L (ref 3.5–5.1)
Sodium: 143 mmol/L (ref 135–145)
Total Bilirubin: 0.6 mg/dL (ref 0.3–1.2)
Total Protein: 5.2 g/dL — ABNORMAL LOW (ref 6.5–8.1)

## 2019-07-20 LAB — GLUCOSE, CAPILLARY
Glucose-Capillary: 128 mg/dL — ABNORMAL HIGH (ref 70–99)
Glucose-Capillary: 148 mg/dL — ABNORMAL HIGH (ref 70–99)
Glucose-Capillary: 213 mg/dL — ABNORMAL HIGH (ref 70–99)

## 2019-07-20 NOTE — Discharge Summary (Signed)
[place] M Schelling SWF:[id number] DOB: 06-10-1926 DOA: 07/16/2019  PCP: Alysia Penna, MD  Admit date: 07/16/2019 Discharge date: 07/22/2019   Admitted From: home Disposition:  home  Recommendations for Outpatient Follow-up:  1. Follow up with PCP in 1-2 weeks 2. Please obtain BMP/CBC in one week 3.  4. Please follow up on the following pending results:  Home Health:NO Equipment/Devices: none  Discharge Condition:STABLE CODE STATUS:FULL Diet recommendation: Heart Healthy   Brief/Interim Summary: History of present illness:  Sonya Chavez is a 85 y.o. year old female with medical history significant for CAD status post CABG, HTN, type 2 diabetes, prior small bowel obstruction (hospitalized 01/2019 with concern for cholecystitis but patient declined HIDA scan , March/2020) who presented on 07/16/2019 with emesis and vomiting x1 day with associated worsening confusion reported by husband and was found to have small bowel obstruction with AMS and likely UTI.   Husband provides all relevant history, states patient has been feeling well before suddenly developing nausea and emesis episodes with worsening confusion. He brought her to the ED because he was concerned she had developed another obstruction.  He reports she had a normal bowel movement the day prior.  In the ED: T-max 98.9 blood pressure 177/68.  Lab work notable for glucose of 234, D-dimer greater than 20, BNP 278.  UA with pyuria with rare bacteria.  WBC 18.2.  Creatinine 1.61 (baseline). Chest x-ray no acute findings.  CTA obtained due to elevated D-dimer with reported hypoxia of 86% on room air in ED, it was negative for PE or any abnormal lung findings but did confirm small bowel obstruction.  NG tube was placed, surgery was consulted, patient was [facility].  Remaining hospital course addressed in problem based format below:   Hospital Course:   Small bowel obstruction,  resolved.   With NGT x 2 days as recommended by surgery,  IVF, IV antiemetics and medical management SBO resolved clinically and radiographically on  on SBO Gastrografin protocol. Now tolerating normal diet -continue regular diet on discharge -PCP follow up  Hypoactive Delirium, resolved in the setting of SBO and associated pain, UTI and hypernatremia.  CT head negative for acute abnormalities.  TSH, ammonia,Electrolytes within normal limits per husband baseline is functional and independent of ADLs and corroborated on previous documentation from last hospital stay in 01/2019.  On discharge day alert and oriented x 4 and following all commands, and very conversant  Hypernatremia, resolved In the setting of diminished p.o. intake from SBO.  Improved with D5 half-normal saline and was able to tolerate oral diet with resolution of hypernatremia prior to discharge ( Na 143 on dischare day down from peak of 149  Hypokalemia.   In setting of nausea and possible emesis at home related to SBO. Resolved with supplementation. K 4.4 on discharge   Elevated D-dimer.  Likely result of inflammatory marker increased from SBO and UTI.  CTA negative for PE and patient has no O2 requirements  Klebsiella UTI Urine culture positive for Klebsiella,  --Completed 3-day course of IV ceftriaxone while hospitalized --No urinary complaints prior to discharge  Leukocytosis, resolved.  Suspect stress leukocytosis in the setting of SBO, but also could be related to likely UTI.  Patient remains afebrile, chest x-ray/CT chest/abdomen with no other overt infectious process.  Blood cultures remained negative. WBC wnl on discharge  Dilated and thickened gall bladder with large complex gallstone, chronic.  Unchanged from 12/2018. Surgery does not think this is cause of acute  problems. Patient declined HIDA scan on last admission in 01/2019 LFTs/T bilirubin wnl during this hospitalization. No visibile biliary dilatation on CT  imaging, LFTs and T bilirubin wnl -received flagyl in ED, surgery recommended not continuing --closely monitor   High-sensitivity troponin trend.   Initial level 36 then down to6. Not consistent with ACS given decrease in 2 hours. No symptoms and no acute ischemic changes on EKG. TTE without wall motion abnormalities, preserved EF  CKD, stage IIIA, stable.   Most recent creatinine was 01/2019 1.6,  remained stable during hospital stay   Normocytic anemia, chronic.   Hemoglobin remained stable   Hypertension,  Elevated during hospital stay when she was NPO and required scheduled IV hydralazine and labetaol. Was able to resume home meds prior to discharge once she tolerated PO - continue home BP meds:  amlodipine, carvedilol, hydralazine, chlorthalidone   Hyperlipidemia, stable.  -- home statin  Type 2 diabetes with peripheral neuropathy.   A1c. 7.5(12/2018). Monitored CBG and used sliding scale as needed during hospital stay.  -Home gabapentin, and metformin   CAD.  History of CABG.  No obvious cardiac symptoms. -home aspirin       Consultations:  Surgery  Procedures/Studies: NGT tube, 12/11-  Subjective:  Discharge Exam: Vitals:   07/20/19 0427 07/20/19 0926  BP: (!) 129/53 (!) 123/45  Pulse: 72 76  Resp: 16 18  Temp: 98.1 F (36.7 C) 98.1 F (36.7 C)  SpO2: 96% 98%   Vitals:   07/19/19 1637 07/19/19 2040 07/20/19 0427 07/20/19 0926  BP: (!) 166/51 (!) 164/55 (!) 129/53 (!) 123/45  Pulse: (!) 58 75 72 76  Resp: Temp: 98.9 F (37.2 C) 99.6 F (37.6 C) 98.1 F (36.7 C) 98.1 F (36.7 C)  TempSrc: Oral Oral Oral Oral  SpO2: 93% 97% 96% 98%    General: Lying in bed comfortably, conversant Eyes: EOMI, anicteric ENT: Oral Mucosa clear and moist Cardiovascular: regular rate and rhythm, no murmurs, rubs or gallops, no edema, Respiratory: Normal respiratory effort on room air, lungs clear to auscultation bilaterally Abdomen: soft,  non-distended, non-tender, normal bowel sounds Skin: No Rash Neurologic: Alert, oriented to place([place]),time ( year and month), self. no deficits, following all commands Psychiatric:Appropriate affect, and mood  Discharge Diagnoses:  Active Problems:   HTN (hypertension)   DM (diabetes mellitus) (HCC)   CAD (coronary artery disease)   Delirium due to another medical condition   SBO (small bowel obstruction) (HCC)   Porcelain gallbladder   Leukocytosis   CKD (chronic kidney disease), stage III   Cholelithiasis   Elevated troponin   Acute lower UTI   Positive D dimer   Normocytic anemia   Hypernatremia   Hypokalemia    Discharge Instructions  Discharge Instructions    Diet - low sodium heart healthy   Complete by: As directed    Increase activity slowly   Complete by: As directed      Allergies as of 07/20/2019      Reactions   Statins Other (See Comments)   Myalgias   Other    NO FOODS WITH SEEDS   Crestor [rosuvastatin] Other (See Comments)   Myalgias   Lipitor [atorvastatin] Other (See Comments)   myalgias   Zocor [simvastatin] Other (See Comments)   myalgias      Medication List    TAKE these medications   acetaminophen 500 MG tablet Commonly known as: TYLENOL Take 500-1,000 mg by mouth daily.   alendronate  70 MG tablet Commonly known as: FOSAMAX Take 70 mg by mouth every Monday.   amLODipine 10 MG tablet Commonly known as: NORVASC Take 10 mg by mouth at bedtime.   aspirin EC 81 MG tablet Take 81 mg by mouth at bedtime.   carvedilol 6.25 MG tablet Commonly known as: COREG Take 12.5 mg by mouth 2 (two) times daily.   chlorthalidone 25 MG tablet Commonly known as: HYGROTON Take 25 mg by mouth daily.   gabapentin 300 MG capsule Commonly known as: NEURONTIN Take 600 mg by mouth at bedtime.   hydrALAZINE 25 MG tablet Commonly known as: APRESOLINE Take 1 tablet (25 mg total) by mouth 3 (three) times daily.   lovastatin 40 MG  tablet Commonly known as: MEVACOR Take 40 mg by mouth every evening.   metFORMIN 500 MG tablet Commonly known as: GLUCOPHAGE Take 500 mg by mouth at bedtime.   pantoprazole 40 MG tablet Commonly known as: PROTONIX Take 40 mg by mouth daily.   triamcinolone cream 0.1 % Commonly known as: KENALOG Apply 1 application topically 2 (two) times daily.   Vitamin D3 50 MCG (2000 UT) Tabs Take 1 tablet by mouth daily.       Allergies  Allergen Reactions  . Statins Other (See Comments)    Myalgias   . Other     NO FOODS WITH SEEDS  . Crestor [Rosuvastatin] Other (See Comments)    Myalgias  . Lipitor [Atorvastatin] Other (See Comments)    myalgias  . Zocor [Simvastatin] Other (See Comments)    myalgias        The results of significant diagnostics from this hospitalization (including imaging, microbiology, ancillary and laboratory) are listed below for reference.     Microbiology: Recent Results (from the past 240 hour(s))  Blood culture (routine x 2)     Status: None   Collection Time: 07/16/19 11:10 PM   Specimen: BLOOD LEFT WRIST  Result Value Ref Range Status   Specimen Description BLOOD LEFT WRIST  Final   Special Requests   Final    BOTTLES DRAWN AEROBIC AND ANAEROBIC Blood Culture results may not be optimal due to an inadequate volume of blood received in culture bottles   Culture   Final    NO GROWTH 5 DAYS Performed at [facility], [address], [place], [place] [address]    Report Status 07/22/2019 FINAL  Final  Urine culture     Status: Abnormal   Collection Time: 07/16/19 11:20 PM   Specimen: Urine, Random  Result Value Ref Range Status   Specimen Description URINE, RANDOM  Final   Special Requests   Final    NONE Performed at [facility], [address], [place], [place] [address]    Culture >=100,000 COLONIES/mL KLEBSIELLA PNEUMONIAE (A)  Final   Report Status 07/19/2019 FINAL  Final   Organism ID, Bacteria KLEBSIELLA PNEUMONIAE  (A)  Final      Susceptibility   Klebsiella pneumoniae - MIC*    AMPICILLIN >=32 RESISTANT Resistant     CEFAZOLIN <=4 SENSITIVE Sensitive     CEFTRIAXONE <=1 SENSITIVE Sensitive     CIPROFLOXACIN <=0.25 SENSITIVE Sensitive     GENTAMICIN <=1 SENSITIVE Sensitive     IMIPENEM <=0.25 SENSITIVE Sensitive     NITROFURANTOIN 128 RESISTANT Resistant     TRIMETH/SULFA <=20 SENSITIVE Sensitive     AMPICILLIN/SULBACTAM 8 SENSITIVE Sensitive     PIP/TAZO <=4 SENSITIVE Sensitive     * >=100,000  COLONIES/mL KLEBSIELLA PNEUMONIAE  SARS CORONAVIRUS 2 (TAT 6-24 HRS) Nasopharyngeal Nasopharyngeal Swab     Status: None   Collection Time: 07/16/19 11:20 PM   Specimen: Nasopharyngeal Swab  Result Value Ref Range Status   SARS Coronavirus 2 NEGATIVE NEGATIVE Final    Comment: (NOTE) SARS-CoV-2 target nucleic acids are NOT DETECTED. The SARS-CoV-2 RNA is generally detectable in upper and lower respiratory specimens during the acute phase of infection. Negative results do not preclude SARS-CoV-2 infection, do not rule out co-infections with other pathogens, and should not be used as the sole basis for treatment or other patient management decisions. Negative results must be combined with clinical observations, patient history, and epidemiological information. The expected result is Negative. Fact Sheet for Patients: HairSlick.no Fact Sheet for Healthcare Providers: quierodirigir.com This test is not yet approved or cleared by the [place] FDA and  has been authorized for detection and/or diagnosis of SARS-CoV-2 by FDA under an Emergency Use Authorization (EUA). This EUA will remain  in effect (meaning this test can be used) for the duration of the COVID-19 declaration under Section 56 4(b)(1) of the Act, 21 U.S.C. section 360bbb-3(b)(1), unless the authorization is terminated or revoked sooner. Performed at [facility], [address] [place], [place] [address]   Blood culture (routine x 2)     Status: None   Collection Time: 07/17/19  1:54 AM   Specimen: Site Not Specified; Blood  Result Value Ref Range Status   Specimen Description SITE NOT SPECIFIED  Final   Special Requests   Final    AEROBIC BOTTLE ONLY Blood Culture results may not be optimal due to an inadequate volume of blood received in culture bottles   Culture   Final    NO GROWTH 5 DAYS Performed at [facility], [address], [place], [place] [address]    Report Status 07/22/2019 FINAL  Final     Labs: BNP (last 3 results) Recent Labs    07/16/19 2240  BNP 278.7*   Basic Metabolic Panel: Recent Labs  Lab 07/16/19 2229 07/18/19 0509 07/19/19 0611 07/20/19 0628  NA 144 149* 146* 143  K 3.8 3.1* 3.1* 4.4  CL 102 110 111 111  CO2 GLUCOSE 284* 156* 152* 143*  BUN 42* 34* 23 18  CREATININE 1.61* 1.42* 1.18* 1.32*  CALCIUM 9.2 8.3* 8.1* 7.7*  MG  --   --  1.6*  --    Liver Function Tests: Recent Labs  Lab 07/16/19 2229 07/18/19 0509 07/19/19 0611 07/20/19 0628  AST 16 15 14* 14*  ALT ALKPHOS 67 47 44 42  BILITOT 1.0 0.8 0.4 0.6  PROT 7.3 6.0* 5.7* 5.2*  ALBUMIN 4.0 3.2* 2.8* 2.7*   Recent Labs  Lab 07/16/19 2229  LIPASE 25   Recent Labs  Lab 07/16/19 2229  AMMONIA 14   CBC: Recent Labs  Lab 07/16/19 2229 07/18/19 0509 07/19/19 0611 07/20/19 0628  WBC 18.2* 10.0 6.0 6.0  NEUTROABS 16.2*  --   --   --   HGB 11.3* 8.9* 8.5* 8.6*  HCT 36.4 28.6* 27.7* 27.5*  MCV 83.7 83.6 83.4 83.1  PLT 173 143* 126* 123*   Cardiac Enzymes: No results for input(s): CKTOTAL, CKMB, CKMBINDEX, TROPONINI in the last 168 hours. BNP: Invalid input(s): POCBNP CBG: Recent Labs  Lab 07/19/19 1948 07/19/19 2353 07/20/19 0428 07/20/19 0730 07/20/19 1105  GLUCAP 178* 145* 128* 148* 213*  D-Dimer No results for input(s): DDIMER in the last 72 hours. Hgb A1c No results for input(s): HGBA1C in  the last 72 hours. Lipid Profile No results for input(s): CHOL, HDL, LDLCALC, TRIG, CHOLHDL, LDLDIRECT in the last 72 hours. Thyroid function studies No results for input(s): TSH, T4TOTAL, T3FREE, THYROIDAB in the last 72 hours.  Invalid input(s): FREET3 Anemia work up No results for input(s): VITAMINB12, FOLATE, FERRITIN, TIBC, IRON, RETICCTPCT in the last 72 hours. Urinalysis    Component Value Date/Time   COLORURINE YELLOW 07/16/2019 2230   APPEARANCEUR HAZY (A) 07/16/2019 2230   LABSPEC 1.017 07/16/2019 2230   PHURINE 7.0 07/16/2019 2230   GLUCOSEU 150 (A) 07/16/2019 2230   HGBUR NEGATIVE 07/16/2019 2230   BILIRUBINUR NEGATIVE 07/16/2019 2230   KETONESUR NEGATIVE 07/16/2019 2230   PROTEINUR 100 (A) 07/16/2019 2230   UROBILINOGEN 0.2 06/03/2013 1938   NITRITE NEGATIVE 07/16/2019 2230   LEUKOCYTESUR LARGE (A) 07/16/2019 2230   Sepsis Labs Invalid input(s): PROCALCITONIN,  WBC,  LACTICIDVEN Microbiology Recent Results (from the past 240 hour(s))  Blood culture (routine x 2)     Status: None   Collection Time: 07/16/19 11:10 PM   Specimen: BLOOD LEFT WRIST  Result Value Ref Range Status   Specimen Description BLOOD LEFT WRIST  Final   Special Requests   Final    BOTTLES DRAWN AEROBIC AND ANAEROBIC Blood Culture results may not be optimal due to an inadequate volume of blood received in culture bottles   Culture   Final    NO GROWTH 5 DAYS Performed at [facility], [address] [place], [place] [address]    Report Status 07/22/2019 FINAL  Final  Urine culture     Status: Abnormal   Collection Time: 07/16/19 11:20 PM   Specimen: Urine, Random  Result Value Ref Range Status   Specimen Description URINE, RANDOM  Final   Special Requests   Final    NONE Performed at [facility], [address] [place], [place] [address]    Culture >=100,000 COLONIES/mL KLEBSIELLA PNEUMONIAE (A)  Final   Report Status 07/19/2019 FINAL  Final   Organism ID, Bacteria  KLEBSIELLA PNEUMONIAE (A)  Final      Susceptibility   Klebsiella pneumoniae - MIC*    AMPICILLIN >=32 RESISTANT Resistant     CEFAZOLIN <=4 SENSITIVE Sensitive     CEFTRIAXONE <=1 SENSITIVE Sensitive     CIPROFLOXACIN <=0.25 SENSITIVE Sensitive     GENTAMICIN <=1 SENSITIVE Sensitive     IMIPENEM <=0.25 SENSITIVE Sensitive     NITROFURANTOIN 128 RESISTANT Resistant     TRIMETH/SULFA <=20 SENSITIVE Sensitive     AMPICILLIN/SULBACTAM 8 SENSITIVE Sensitive     PIP/TAZO <=4 SENSITIVE Sensitive     * >=100,000 COLONIES/mL KLEBSIELLA PNEUMONIAE  SARS CORONAVIRUS 2 (TAT 6-24 HRS) Nasopharyngeal Nasopharyngeal Swab     Status: None   Collection Time: 07/16/19 11:20 PM   Specimen: Nasopharyngeal Swab  Result Value Ref Range Status   SARS Coronavirus 2 NEGATIVE NEGATIVE Final    Comment: (NOTE) SARS-CoV-2 target nucleic acids are NOT DETECTED. The SARS-CoV-2 RNA is generally detectable in upper and lower respiratory specimens during the acute phase of infection. Negative results do not preclude SARS-CoV-2 infection, do not rule out co-infections with other pathogens, and should not be used as the sole basis for treatment or other patient management decisions. Negative results must be combined with clinical observations, patient history, and epidemiological information. The expected result is Negative. Fact  Sheet for Patients: SugarRoll.be Fact Sheet for Healthcare Providers: https://www.woods-mathews.com/ This test is not yet approved or cleared by the [place] FDA and  has been authorized for detection and/or diagnosis of SARS-CoV-2 by FDA under an Emergency Use Authorization (EUA). This EUA will remain  in effect (meaning this test can be used) for the duration of the COVID-19 declaration under Section 56 4(b)(1) of the Act, 21 U.S.C. section 360bbb-3(b)(1), unless the authorization is terminated or revoked sooner. Performed at [facility], [place] [address], [place], [place] [address]   Blood culture (routine x 2)     Status: None   Collection Time: 07/17/19  1:54 AM   Specimen: Site Not Specified; Blood  Result Value Ref Range Status   Specimen Description SITE NOT SPECIFIED  Final   Special Requests   Final    AEROBIC BOTTLE ONLY Blood Culture results may not be optimal due to an inadequate volume of blood received in culture bottles   Culture   Final    NO GROWTH 5 DAYS Performed at [facility], [place] [address], [place], [place] [address]    Report Status 07/22/2019 FINAL  Final     Time coordinating discharge: Over 30 minutes  SIGNED:   Desiree Hane, MD  [facility] 07/22/2019, 1:19 PM Pager   If 7PM-7AM, please contact night-coverage [email or web address] Password [email or web address]

## 2019-07-20 NOTE — Progress Notes (Signed)
[place] Behrendt to be discharged Home per MD order. Discussed prescriptions and follow up appointments with the patient. Prescriptions given to patient; medication list explained in detail. Patient verbalized understanding.  Skin clean, dry and intact without evidence of skin break down, no evidence of skin tears noted. IV catheter discontinued intact. Site without signs and symptoms of complications. Dressing and pressure applied. Pt denies pain at the site currently. No complaints noted.  Patient free of lines, drains, and wounds.   An After Visit Summary (AVS) was printed and given to the patient. Patient escorted via wheelchair, and discharged home via private auto.  Shela Commons, RN

## 2019-07-22 ENCOUNTER — Ambulatory Visit: Payer: PRIVATE HEALTH INSURANCE | Admitting: Cardiology

## 2019-07-22 LAB — CULTURE, BLOOD (ROUTINE X 2)
Culture: NO GROWTH
Culture: NO GROWTH

## 2019-08-27 ENCOUNTER — Ambulatory Visit (INDEPENDENT_AMBULATORY_CARE_PROVIDER_SITE_OTHER): Payer: Medicare Other | Admitting: Cardiology

## 2019-08-27 ENCOUNTER — Other Ambulatory Visit: Payer: Self-pay

## 2019-08-27 ENCOUNTER — Ambulatory Visit: Payer: PRIVATE HEALTH INSURANCE | Admitting: Cardiology

## 2019-08-27 ENCOUNTER — Encounter: Payer: Self-pay | Admitting: Cardiology

## 2019-08-27 VITALS — BP 163/68 | HR 68 | Temp 98.2°F | Resp 16 | Ht 62.0 in | Wt 142.2 lb

## 2019-08-27 DIAGNOSIS — Z951 Presence of aortocoronary bypass graft: Secondary | ICD-10-CM | POA: Diagnosis not present

## 2019-08-27 DIAGNOSIS — I10 Essential (primary) hypertension: Secondary | ICD-10-CM | POA: Diagnosis not present

## 2019-08-27 DIAGNOSIS — I2510 Atherosclerotic heart disease of native coronary artery without angina pectoris: Secondary | ICD-10-CM | POA: Diagnosis not present

## 2019-08-27 DIAGNOSIS — I739 Peripheral vascular disease, unspecified: Secondary | ICD-10-CM | POA: Diagnosis not present

## 2019-08-27 DIAGNOSIS — E782 Mixed hyperlipidemia: Secondary | ICD-10-CM | POA: Diagnosis not present

## 2019-08-27 MED ORDER — HYDRALAZINE HCL 50 MG PO TABS: 50.0000 mg | ORAL_TABLET | Freq: Three times a day (TID) | ORAL | 3 refills | Status: DC

## 2019-08-27 MED ORDER — LOVASTATIN 40 MG PO TABS: 40.0000 mg | ORAL_TABLET | Freq: Every evening | ORAL | 11 refills | Status: DC

## 2019-08-27 NOTE — Progress Notes (Signed)
Primary Physician:  Velna Hatchet, MD   Patient ID: Sonya Chavez, female    DOB: May 02, 1926, 86 y.o.   MRN: [id number]  Subjective:    Chief Complaint  Patient presents with  . Coronary Artery Disease  . Follow-up    1 year    HPI: Sonya Chavez  is a 86 y.o. female  with known coronary artery disease and CABG on 06/04/2002, LBBB, peripheral arterial disease with left subclavian artery stenosis S/P stenting in 2016 with resolution of dizziness, controlled DM, hypertension, stage 3 CKD, dyslipidemia presents here for annual visit.   Doing well and has not had any chest pain and denies any symptoms of claudication and upper extremities.  Past Medical History:  Diagnosis Date  . Arthritis    "qwhere"  . Coronary artery disease   . Hypercholesteremia   . Hypertension   . Myocardial infarction ([place]) 2003  . SBO (small bowel obstruction) ([place]) 12/18/2016  . Stenosis of subclavian artery ([place])   . Type II diabetes mellitus ([place])     Past Surgical History:  Procedure Laterality Date  . ABDOMINAL HYSTERECTOMY  1970  . BOWEL RESECTION N/A 12/20/2016   Procedure: SMALL BOWEL RESECTION;  Surgeon: Coralie Keens, MD;  Location: [facility];  Service: General;  Laterality: N/A;  . CARDIAC CATHETERIZATION    . CATARACT EXTRACTION Bilateral 2000's  . CORONARY ARTERY BYPASS GRAFT  2003   in [place], [place]  . LAPAROTOMY N/A 12/20/2016   Procedure: EXPLORATORY LAPAROTOMY;  Surgeon: Coralie Keens, MD;  Location: [facility];  Service: General;  Laterality: N/A;  . LEFT HEART CATHETERIZATION WITH CORONARY/GRAFT ANGIOGRAM N/A 09/27/2014   Procedure: LEFT HEART CATHETERIZATION WITH Beatrix Fetters;  Surgeon: Laverda Page, MD;  Location: [facility] C[facility] LAB;  Service: Cardiovascular;  Laterality: N/A;  . UNILATERAL UPPER EXTREMEITY ANGIOGRAM N/A 09/27/2014   Procedure: SUBCLAVIAN ARTERIOGRAM ;  Surgeon: Laverda Page, MD;  Location: [facility] CATH LAB;  Service:  Cardiovascular;  Laterality: N/A;  . UNILATERAL UPPER EXTREMEITY ANGIOGRAM N/A 11/09/2014   Procedure: Left subclavian stent ;  Surgeon: Adrian Prows, MD;  Location: [facility] CATH LAB;  Service: Cardiovascular;  Laterality: N/A;  . VISCERAL ANGIOGRAM N/A 10/25/2014   Procedure: Blanchard Kelch;  Surgeon: Adrian Prows, MD;  Location: [facility] CATH LAB;  Service: Cardiovascular;  Laterality: N/A;    Social History   Socioeconomic History  . Marital status: Married    Spouse name: Not on file  . Number of children: 1  . Years of education: Not on file  . Highest education level: Not on file  Occupational History  . Not on file  Tobacco Use  . Smoking status: Never Smoker  . Smokeless tobacco: Never Used  Substance and Sexual Activity  . Alcohol use: No  . Drug use: No  . Sexual activity: Not Currently  Other Topics Concern  . Not on file  Social History Narrative  . Not on file   Social Determinants of Health   Financial Resource Strain:   . Difficulty of Paying Living Expenses: Not on file  Food Insecurity:   . Worried About Charity fundraiser in the Last Year: Not on file  . Ran Out of Food in the Last Year: Not on file  Transportation Needs:   . Lack of Transportation (Medical): Not on file  . Lack of Transportation (Non-Medical): Not on file  Physical Activity:   . Days of Exercise per Week: Not on file  . Minutes of  Exercise per Session: Not on file  Stress:   . Feeling of Stress : Not on file  Social Connections:   . Frequency of Communication with Friends and Family: Not on file  . Frequency of Social Gatherings with Friends and Family: Not on file  . Attends Religious Services: Not on file  . Active Member of Clubs or Organizations: Not on file  . Attends Archivist Meetings: Not on file  . Marital Status: Not on file  Intimate Partner Violence:   . Fear of Current or Ex-Partner: Not on file  . Emotionally Abused: Not on file  . Physically Abused: Not on file  .  Sexually Abused: Not on file    Review of Systems  Constitution: Negative for decreased appetite, malaise/fatigue, weight gain and weight loss.  Eyes: Negative for visual disturbance.  Cardiovascular: Negative for chest pain, claudication, dyspnea on exertion, leg swelling, orthopnea, palpitations and syncope.  Respiratory: Negative for hemoptysis and wheezing.   Endocrine: Negative for cold intolerance and heat intolerance.  Hematologic/Lymphatic: Does not bruise/bleed easily.  Skin: Negative for nail changes.  Musculoskeletal: Negative for muscle weakness and myalgias.  Gastrointestinal: Negative for abdominal pain, change in bowel habit, nausea and vomiting.  Neurological: Negative for difficulty with concentration, dizziness, focal weakness and headaches.  Psychiatric/Behavioral: Negative for altered mental status and suicidal ideas.  All other systems reviewed and are negative.     Objective:  Blood pressure (!) 163/68, pulse 68, temperature 98.2 F (36.8 C), temperature source Temporal, resp. rate 16, height '5\' 2"'  (1.575 m), weight 142 lb 3.2 oz (64.5 kg), SpO2 97 %. Body mass index is 26.01 kg/m.    Physical Exam  Constitutional: She is oriented to person, place, and time. Vital signs are normal. She appears well-developed and well-nourished.  HENT:  Head: Normocephalic and atraumatic.  Cardiovascular: Normal rate, regular rhythm and intact distal pulses.  Murmur heard.  Crescendo early systolic murmur is present with a grade of 2/6 at the upper right sternal border. Pulses:      Carotid pulses are on the right side with bruit and on the left side with bruit.      Femoral pulses are 2+ on the right side and 2+ on the left side.      Popliteal pulses are 1+ on the right side and 1+ on the left side.       Dorsalis pedis pulses are 1+ on the right side and 1+ on the left side.       Posterior tibial pulses are 0 on the right side and 0 on the left side.  Pulmonary/Chest:  Effort normal and breath sounds normal. No accessory muscle usage. No respiratory distress.  Abdominal: Soft. Bowel sounds are normal. She exhibits abdominal bruit.  Musculoskeletal:        General: Normal range of motion.     Cervical back: Normal range of motion.  Neurological: She is alert and oriented to person, place, and time.  Skin: Skin is warm and dry.  Vitals reviewed.  Radiology: No results found.  Laboratory examination:   01/06/2017: Glucose 155, creatinine 1.3, EGFR 38.6/46.7, potassium 4.5, CMP otherwise normal. RBC 3.7, hemoglobin 9.5, hematocrit 29.6, MCV 79.8, MCH 25.5, RDW 15.5%. CBC otherwise normal. Cholesterol 162, triglycerides 95, HDL 47, LDL 96. Apolipoprotein B 81, TSH 2.6, Vitamin D 79.  CMP Latest Ref Rng & Units 07/20/2019 07/19/2019 07/18/2019  Glucose 70 - 99 mg/dL 143(H) 152(H) 156(H)  BUN 8 - 23 mg/dL 18  23 34(H)  Creatinine 0.44 - 1.00 mg/dL 1.32(H) 1.18(H) 1.42(H)  Sodium 135 - 145 mmol/L 143 146(H) 149(H)  Potassium 3.5 - 5.1 mmol/L 4.4 3.1(L) 3.1(L)  Chloride 98 - 111 mmol/L 111 111 110  CO2 22 - 32 mmol/L '25 26 25  ' Calcium 8.9 - 10.3 mg/dL 7.7(L) 8.1(L) 8.3(L)  Total Protein 6.5 - 8.1 g/dL 5.2(L) 5.7(L) 6.0(L)  Total Bilirubin 0.3 - 1.2 mg/dL 0.6 0.4 0.8  Alkaline Phos 38 - 126 U/L 42 44 47  AST 15 - 41 U/L 14(L) 14(L) 15  ALT 0 - 44 U/L '10 9 9   ' CBC Latest Ref Rng & Units 07/20/2019 07/19/2019 07/18/2019  WBC 4.0 - 10.5 K/uL 6.0 6.0 10.0  Hemoglobin 12.0 - 15.0 g/dL 8.6(L) 8.5(L) 8.9(L)  Hematocrit 36.0 - 46.0 % 27.5(L) 27.7(L) 28.6(L)  Platelets 150 - 400 K/uL 123(L) 126(L) 143(L)   Lipid Panel     Component Value Date/Time   CHOL 193 03/09/2013 0500   TRIG 78 03/09/2013 0500   HDL 57 03/09/2013 0500   CHOLHDL 3.4 03/09/2013 0500   VLDL 16 03/09/2013 0500   LDLCALC 120 (H) 03/09/2013 0500   HEMOGLOBIN A1C Lab Results  Component Value Date   HGBA1C 7.7 (H) 07/19/2019   MPG 174.29 07/19/2019   TSH Recent Labs    07/16/19 2240   TSH 3.156    PRN Meds:. Medications Discontinued During This Encounter  Medication Reason  . hydrALAZINE (APRESOLINE) 25 MG tablet Dose change  . lovastatin (MEVACOR) 40 MG tablet Reorder   Current Meds  Medication Sig  . acetaminophen (TYLENOL) 500 MG tablet Take 500-1,000 mg by mouth daily.  Marland Kitchen alendronate (FOSAMAX) 70 MG tablet Take 70 mg by mouth every Monday.   Marland Kitchen amLODipine (NORVASC) 10 MG tablet Take 10 mg by mouth at bedtime.   Marland Kitchen aspirin EC 81 MG tablet Take 81 mg by mouth at bedtime.   . carvedilol (COREG) 6.25 MG tablet Take 12.5 mg by mouth 2 (two) times daily.  . chlorthalidone (HYGROTON) 25 MG tablet Take 25 mg by mouth daily.  . Cholecalciferol (VITAMIN D3) 50 MCG (2000 UT) TABS Take 1 tablet by mouth daily.  Marland Kitchen gabapentin (NEURONTIN) 300 MG capsule Take 600 mg by mouth at bedtime.   . metFORMIN (GLUCOPHAGE) 500 MG tablet Take 500 mg by mouth at bedtime.  . pantoprazole (PROTONIX) 40 MG tablet Take 40 mg by mouth daily.  Marland Kitchen triamcinolone cream (KENALOG) 0.1 % Apply 1 application topically 2 (two) times daily.  . [DISCONTINUED] hydrALAZINE (APRESOLINE) 25 MG tablet Take 1 tablet (25 mg total) by mouth 3 (three) times daily.    Cardiac Studies:   Echocardiogram 03/10/2018: Left ventricle cavity is normal in size. Mild concentric hypertrophy of the left ventricle. Abnormal septal wall motion due to post-operative coronary artery bypass graft. Doppler evidence of grade II (pseudonormal) diastolic dysfunction, elevated LAP. Calculated EF 55%. Left atrial cavity is normal in size. 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 Trace aortic valve stenosis. Aortic valve mean gradient of 9 mmHg, Vmax of 2.0 m/s. Calculated aortic valve area by continuity equation is 1.35 cm. Mild (Grade I) mitral regurgitation. Moderate tricuspid regurgitation. Estimated pulmonary artery systolic pressure 10-07 mmHg. 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 Estimated RA pressure 10-15  mmHg. Compared to previous study in 2016, trace AS is new. Otherwise, no significant changes.  CABG 06/04/2002 with LIMA to LAD and SVG to ramus intermediate and OM1 and OM 2 in a sequential fashion, SVG to  distal RCA. Performed at [facility] in [place], [place]. Coronary and subclavian arteriogram on 09/27/2014: Patent LIMA to LAD, SVG to OM1 and OM 3 and SVG to RCA. Mid LAD with a high-grade 99% stenosis but protected by LIMA to LAD. Left subclavian artery 95% stenosis.  Carotid artery duplex 07/18/2016: Minimal stenosis in the bilateral internal carotid artery (minimal). Antegrade normal vertebral artery flow. Compared to the study done on 02/09/2015, less than 16-49% bilateral carotid stenosis no longer present.  Left SCA angioplasty 11/07/2014: Via brachial approach 8.0 x 29 mm Genesis balloon expandable stent.   Upper extremity arterial duplex 07/18/2016: Mildly abnormal (biphasic) Doppler waveforms of the right subclavian artery. Normal Doppler waveforms are seen throughout the left upper extremity arteries. Antegrade bilateral normal vertebral waveform noted. Study suggests patent left subclavian stent and insignificant stenosis right subclavian artery. Compared to the study done on 06/30/2013, left subclavian stenosis is no longer present.  Assessment:   Coronary artery disease involving native coronary artery of native heart without angina pectoris - Plan: EKG 12-Lead  S/P CABG (coronary artery bypass graft)  PAD (peripheral artery disease) (HCC)  Primary hypertension  Mixed hyperlipidemia  EKG 08/27/2019: Normal sinus rhythm at 69 bpm, left bundle branch block, no further analysis due to LBBB   Recommendations:   Patient is here for 1 year office visit, overall doing well.  Despite her advanced age, she continues to be very active in her house.  She has not had any symptoms of angina.  No clinical evidence of heart failure.  She has previously had stenting to left  subclavian artery, there is no symptoms suggestive of claudication.  Her blood pressure is markedly elevated today, will increase her hydralazine to 50 mg 3 times daily.  She reports her labs that were recently performed at PCP office were stable, will request for records.  I have refilled her lovastatin.  I would like to see her back in approximately 6 weeks to follow-up on hypertension.  Miquel Dunn, MSN, APRN, FNP-C [facility]. [place] Office: [phone] Fax: [phone]

## 2019-08-30 ENCOUNTER — Encounter: Payer: Self-pay | Admitting: Cardiology

## 2019-09-16 DIAGNOSIS — H5201 Hypermetropia, right eye: Secondary | ICD-10-CM | POA: Diagnosis not present

## 2019-09-16 DIAGNOSIS — E113291 Type 2 diabetes mellitus with mild nonproliferative diabetic retinopathy without macular edema, right eye: Secondary | ICD-10-CM | POA: Diagnosis not present

## 2019-09-16 DIAGNOSIS — Z961 Presence of intraocular lens: Secondary | ICD-10-CM | POA: Diagnosis not present

## 2019-09-20 ENCOUNTER — Other Ambulatory Visit: Payer: Self-pay

## 2019-09-20 ENCOUNTER — Emergency Department (HOSPITAL_COMMUNITY): Payer: Medicare Other

## 2019-09-20 ENCOUNTER — Ambulatory Visit: Payer: Medicare Other

## 2019-09-20 ENCOUNTER — Emergency Department (HOSPITAL_COMMUNITY)
Admission: EM | Admit: 2019-09-20 | Discharge: 2019-09-20 | Disposition: A | Discharge status: Home / Self Care | Payer: Medicare Other | Attending: Emergency Medicine | Admitting: Emergency Medicine

## 2019-09-20 DIAGNOSIS — I2510 Atherosclerotic heart disease of native coronary artery without angina pectoris: Secondary | ICD-10-CM | POA: Insufficient documentation

## 2019-09-20 DIAGNOSIS — I10 Essential (primary) hypertension: Secondary | ICD-10-CM | POA: Diagnosis not present

## 2019-09-20 DIAGNOSIS — Z7982 Long term (current) use of aspirin: Secondary | ICD-10-CM | POA: Insufficient documentation

## 2019-09-20 DIAGNOSIS — E119 Type 2 diabetes mellitus without complications: Secondary | ICD-10-CM | POA: Insufficient documentation

## 2019-09-20 DIAGNOSIS — Y999 Unspecified external cause status: Secondary | ICD-10-CM | POA: Insufficient documentation

## 2019-09-20 DIAGNOSIS — Y939 Activity, unspecified: Secondary | ICD-10-CM | POA: Insufficient documentation

## 2019-09-20 DIAGNOSIS — Y92000 Kitchen of unspecified non-institutional (private) residence as  the place of occurrence of the external cause: Secondary | ICD-10-CM | POA: Insufficient documentation

## 2019-09-20 DIAGNOSIS — W07XXXA Fall from chair, initial encounter: Secondary | ICD-10-CM | POA: Insufficient documentation

## 2019-09-20 DIAGNOSIS — Z7984 Long term (current) use of oral hypoglycemic drugs: Secondary | ICD-10-CM | POA: Insufficient documentation

## 2019-09-20 DIAGNOSIS — S0990XA Unspecified injury of head, initial encounter: Secondary | ICD-10-CM

## 2019-09-20 DIAGNOSIS — S0181XA Laceration without foreign body of other part of head, initial encounter: Secondary | ICD-10-CM | POA: Insufficient documentation

## 2019-09-20 DIAGNOSIS — Z79899 Other long term (current) drug therapy: Secondary | ICD-10-CM | POA: Diagnosis not present

## 2019-09-20 DIAGNOSIS — W19XXXA Unspecified fall, initial encounter: Secondary | ICD-10-CM

## 2019-09-20 NOTE — ED Notes (Signed)
Pt went to x-ray.

## 2019-09-20 NOTE — ED Notes (Signed)
All appropriate discharge materials reviewed at length with patient. Time for questions provided. Pt has no other questions at this time and verbalizes understanding of all provided materials.  

## 2019-09-20 NOTE — Discharge Instructions (Addendum)
Your head CT is reassuring and the small cut on your face appears to be healing well.  You can apply ice for the small amount of swelling surrounding this, use Tylenol as needed for discomfort.  Follow-up with your regular doctor.  Return for any new or worsening symptoms.

## 2019-09-20 NOTE — ED Triage Notes (Signed)
Pt reports at 8pm last night she fell asleep in the kitchen in a chair and fell out of chair hitting the right side of her head. Pt has small lac on right side of head. No bleeding. Pt denies any other injury from fall and states has been walking today without issues. Pt is alert and ox4.

## 2019-09-20 NOTE — ED Provider Notes (Signed)
[facility] EMERGENCY DEPARTMENT Provider Note   CSN: [id number] Arrival date & time: 09/20/19  1107     History Chief Complaint  Patient presents with  . Fall    [name] is a 86 y.o. female.  [name] is a 86 y.o. female with a history of hypertension, hyperlipidemia, CAD, diabetes, SBO, subclavian artery stenosis, who presents to the ED for evaluation after a fall.  She reports last night around 8 PM she was sitting up in a chair in her kitchen when she dozed off causing her to fall sideways out of the chair, she struck the right side of her head on the ground and sustained a small laceration just above the right eyebrow that is already well-healing.  She states that she did not have any loss of consciousness has had a mild ache over the right side of her head.  She denies any vision changes, dizziness, nausea, vomiting, numbness weakness or tingling.  She denies any pain in her neck or back.  She reports that her right arm was a little sore right after the fall but now there is no pain and she denies any swelling or deformity.  She states her tetanus shot was updated within the last year.  Patient is on aspirin but no other blood thinners.  No other aggravating or alleviating factors        Past Medical History:  Diagnosis Date  . Arthritis    "qwhere"  . Coronary artery disease   . Hypercholesteremia   . Hypertension   . Myocardial infarction ([place]) 2003  . SBO (small bowel obstruction) ([place]) 12/18/2016  . Stenosis of subclavian artery ([place])   . Type II diabetes mellitus [facility])     Patient Active Problem List   Diagnosis Date Noted  . Hypernatremia 07/18/2019  . Hypokalemia 07/18/2019  . Elevated troponin 07/17/2019  . Acute lower UTI 07/17/2019  . Positive D dimer 07/17/2019  . Normocytic anemia 07/17/2019  . Cholelithiasis 01/02/2019  . CKD (chronic kidney disease), stage III 10/21/2017  . Delirium due to another medical  condition 12/18/2016  . SBO (small bowel obstruction) ([place]) 12/18/2016  . 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 Leukocytosis 12/18/2016  . Peripheral artery disease ([place]) 11/08/2014  . PAD (peripheral artery disease) ([place]) 10/24/2014  . LBBB (left bundle branch block) 09/27/2014    Class: Chronic  . Subclavian artery stenosis, left ([place]) 09/27/2014  . Numbness and tingling in left hand 03/08/2013  . HTN (hypertension) 03/08/2013  . DM (diabetes mellitus) ([place]) 03/08/2013  . CAD (coronary artery disease) 03/08/2013    Past Surgical History:  Procedure Laterality Date  . ABDOMINAL HYSTERECTOMY  1970  . BOWEL RESECTION N/A 12/20/2016   Procedure: SMALL BOWEL RESECTION;  Surgeon: Coralie Keens, MD;  Location: [facility];  Service: General;  Laterality: N/A;  . CARDIAC CATHETERIZATION    . CATARACT EXTRACTION Bilateral 2000's  . CORONARY ARTERY BYPASS GRAFT  2003   in [place], [place]  . LAPAROTOMY N/A 12/20/2016   Procedure: EXPLORATORY LAPAROTOMY;  Surgeon: Coralie Keens, MD;  Location: [facility];  Service: General;  Laterality: N/A;  . LEFT HEART CATHETERIZATION WITH CORONARY/GRAFT ANGIOGRAM N/A 09/27/2014   Procedure: LEFT HEART CATHETERIZATION WITH Beatrix Fetters;  Surgeon: Laverda Page, MD;  Location: [facility] CATH LAB;  Service: Cardiovascular;  Laterality: N/A;  . UNILATERAL UPPER EXTREMEITY ANGIOGRAM N/A 09/27/2014   Procedure: SUBCLAVIAN ARTERIOGRAM ;  Surgeon: Laverda Page, MD;  Location: [facility] CATH LAB;  Service: Cardiovascular;  Laterality: N/A;  . UNILATERAL UPPER EXTREMEITY ANGIOGRAM N/A 11/09/2014   Procedure: Left subclavian stent ;  Surgeon: Yates Decamp, MD;  Location: [facility] CATH LAB;  Service: Cardiovascular;  Laterality: N/A;  . VISCERAL ANGIOGRAM N/A 10/25/2014   Procedure: Laurence Slate;  Surgeon: Yates Decamp, MD;  Location: [facility] CATH LAB;  Service: Cardiovascular;  Laterality: N/A;     OB History   No obstetric history on file.     Family History  Problem  Relation Age of Onset  . Diabetes Father   . Gallbladder disease Neg Hx     Social History   Tobacco Use  . Smoking status: Never Smoker  . Smokeless tobacco: Never Used  Substance Use Topics  . Alcohol use: No  . Drug use: No    Home Medications Prior to Admission medications   Medication Sig Start Date End Date Taking? Authorizing Provider  acetaminophen (TYLENOL) 500 MG tablet Take 500-1,000 mg by mouth daily.    [provider]  alendronate (FOSAMAX) 70 MG tablet Take 70 mg by mouth every Monday.  12/08/14   [provider]  amLODipine (NORVASC) 10 MG tablet Take 10 mg by mouth at bedtime.  10/12/14   [provider]  aspirin EC 81 MG tablet Take 81 mg by mouth at bedtime.     [provider]  carvedilol (COREG) 6.25 MG tablet Take 12.5 mg by mouth 2 (two) times daily. 10/14/18   [provider]  chlorthalidone (HYGROTON) 25 MG tablet Take 25 mg by mouth daily. 03/17/19   [provider]  Cholecalciferol (VITAMIN D3) 50 MCG (2000 UT) TABS Take 1 tablet by mouth daily.    [provider]  gabapentin (NEURONTIN) 300 MG capsule Take 600 mg by mouth at bedtime.  04/09/17   [provider]  hydrALAZINE (APRESOLINE) 50 MG tablet Take 1 tablet (50 mg total) by mouth 3 (three) times daily. 08/27/19 11/25/19  Toniann Fail, NP  lovastatin (MEVACOR) 40 MG tablet Take 1 tablet (40 mg total) by mouth every evening. 08/27/19   Toniann Fail, NP  metFORMIN (GLUCOPHAGE) 500 MG tablet Take 500 mg by mouth at bedtime. 06/01/19   [provider]  pantoprazole (PROTONIX) 40 MG tablet Take 40 mg by mouth daily. 07/04/19   [provider]  triamcinolone cream (KENALOG) 0.1 % Apply 1 application topically 2 (two) times daily. 06/28/19   [provider]    Allergies    Statins, Other, Crestor [rosuvastatin], Lipitor [atorvastatin], and Zocor [simvastatin]  Review of Systems   Review of Systems    Constitutional: Negative for chills and fever.  HENT: Negative for facial swelling.   Eyes: Negative for pain and visual disturbance.  Gastrointestinal: Negative for nausea and vomiting.  Musculoskeletal: Negative for arthralgias, back pain, myalgias and neck pain.  Skin: Positive for wound. Negative for color change and rash.  Neurological: Negative for dizziness, syncope, facial asymmetry, speech difficulty, weakness, light-headedness, numbness and headaches.    Physical Exam Updated Vital Signs BP (!) 185/65 (BP Location: Right Arm)   Pulse 61   Temp 98.8 F (37.1 C) (Oral)   Resp 16   SpO2 100%   Physical Exam Vitals and nursing note reviewed.  Constitutional:      General: She is not in acute distress.    Appearance: Normal appearance. She is well-developed and normal weight. She is not ill-appearing or diaphoretic.  HENT:     Head: Normocephalic.  Comments: 2 cm laceration just above the lateral right eyebrow already well healing with small amount of surrounding tenderness and swelling, no other bony tenderness over the orbit, no other scalp hematomas, deformity or step-off noted, neg battle sign, no CSF ottorhea Eyes:     General:        Right eye: No discharge.        Left eye: No discharge.     Pupils: Pupils are equal, round, and reactive to light.  Neck:     Comments: No midline tenderness, normal ROM Cardiovascular:     Rate and Rhythm: Normal rate and regular rhythm.     Heart sounds: Normal heart sounds.  Pulmonary:     Effort: Pulmonary effort is normal. No respiratory distress.     Breath sounds: Normal breath sounds. No wheezing or rales.     Comments: Respirations equal and unlabored, patient able to speak in full sentences, lungs clear to auscultation bilaterally, chest NTTP Chest:     Chest wall: No tenderness.  Abdominal:     General: Bowel sounds are normal. There is no distension.     Palpations: Abdomen is soft. There is no mass.      Tenderness: There is no abdominal tenderness. There is no guarding.  Musculoskeletal:        General: No deformity.     Cervical back: Neck supple.     Comments: T-spine and L-spine nontender to palpation at midline. Patient moves all extremities without difficulty. All joints supple and easily movable, no erythema, swelling or palpable deformity, all compartments soft.  Skin:    General: Skin is warm and dry.     Capillary Refill: Capillary refill takes less than 2 seconds.  Neurological:     Mental Status: She is alert.     Coordination: Coordination normal.     Comments: Speech is clear, able to follow commands CN III-XII intact Normal strength in upper and lower extremities bilaterally including dorsiflexion and plantar flexion, strong and equal grip strength Sensation normal to light and sharp touch Moves extremities without ataxia, coordination intact Ambulatory with steady gait  Psychiatric:        Mood and Affect: Mood normal.        Behavior: Behavior normal.     ED Results / Procedures / Treatments   Labs (all labs ordered are listed, but only abnormal results are displayed) Labs Reviewed - No data to display  EKG None  Radiology CT Head Wo Contrast  Result Date: 09/20/2019 CLINICAL DATA:  Head trauma. Fell asleep in the kitchen while sitting in chair. Larey Seat out of chair and hit right-side of head. Small laceration on right-side of head. EXAM: CT HEAD WITHOUT CONTRAST TECHNIQUE: Contiguous axial images were obtained from the base of the skull through the vertex without intravenous contrast. COMPARISON:  07/17/2019 FINDINGS: Brain: No evidence of acute infarction, hemorrhage, hydrocephalus, extra-axial collection or mass lesion/mass effect. There is mild diffuse low-attenuation within the subcortical and periventricular white matter compatible with chronic microvascular disease. Vascular: No hyperdense vessel or unexpected calcification. Skull: Normal. Negative for  fracture or focal lesion. Sinuses/Orbits: No acute finding. Other: None IMPRESSION: 1. No acute intracranial abnormalities. 2. Chronic small vessel ischemic change and brain atrophy. Electronically Signed   By: Signa Kell Sonya.D.   On: 09/20/2019 12:19    Procedures Procedures (including critical care time)  Medications Ordered in ED Medications - No data to display  ED Course  I have reviewed the triage vital  signs and the nursing notes.  Pertinent labs & imaging results that were available during my care of the patient were reviewed by me and considered in my medical decision making (see chart for details).    MDM Rules/Calculators/A&P                      86 year old female presents after a fall last night after she fell asleep sitting up in her chair.  She struck the right side of her head and has a small cut but is already well healing and will not require any repair, tetanus is up-to-date.  Her neurologic exam is without deficits, she has no midline spinal tenderness.  No other signs of injury on exam.  CT of the head ordered given head injury on aspirin which shows no acute intracranial abnormalities.  Patient has been ambulatory today without difficulty.  At this time she is stable for discharge home.  Discussed strict return precautions.  Patient expressed understanding and agreement with plan.  Discharged home in good condition.  Patient discussed with Dr. Stevie Kern, who saw patient as well and agrees with plan.  Final Clinical Impression(s) / ED Diagnoses Final diagnoses:  Fall, initial encounter  Injury of head, initial encounter  Facial laceration, initial encounter    Rx / DC Orders ED Discharge Orders    None       Legrand Rams 09/23/19 [id number]    Milagros Loll, MD 09/23/19 [phone]

## 2019-10-04 DIAGNOSIS — Z8719 Personal history of other diseases of the digestive system: Secondary | ICD-10-CM | POA: Diagnosis not present

## 2019-10-04 DIAGNOSIS — D649 Anemia, unspecified: Secondary | ICD-10-CM | POA: Diagnosis not present

## 2019-10-04 DIAGNOSIS — M25561 Pain in right knee: Secondary | ICD-10-CM | POA: Diagnosis not present

## 2019-10-04 DIAGNOSIS — R413 Other amnesia: Secondary | ICD-10-CM | POA: Diagnosis not present

## 2019-10-04 DIAGNOSIS — R410 Disorientation, unspecified: Secondary | ICD-10-CM | POA: Diagnosis not present

## 2019-10-04 DIAGNOSIS — F05 Delirium due to known physiological condition: Secondary | ICD-10-CM | POA: Diagnosis not present

## 2019-10-04 DIAGNOSIS — Z7189 Other specified counseling: Secondary | ICD-10-CM | POA: Diagnosis not present

## 2019-10-04 DIAGNOSIS — R202 Paresthesia of skin: Secondary | ICD-10-CM | POA: Diagnosis not present

## 2019-10-04 DIAGNOSIS — M25562 Pain in left knee: Secondary | ICD-10-CM | POA: Diagnosis not present

## 2019-10-04 DIAGNOSIS — R82998 Other abnormal findings in urine: Secondary | ICD-10-CM | POA: Diagnosis not present

## 2019-10-04 DIAGNOSIS — R634 Abnormal weight loss: Secondary | ICD-10-CM | POA: Diagnosis not present

## 2019-10-04 DIAGNOSIS — R296 Repeated falls: Secondary | ICD-10-CM | POA: Diagnosis not present

## 2019-10-04 DIAGNOSIS — I130 Hypertensive heart and chronic kidney disease with heart failure and stage 1 through stage 4 chronic kidney disease, or unspecified chronic kidney disease: Secondary | ICD-10-CM | POA: Diagnosis not present

## 2019-10-04 DIAGNOSIS — N1832 Chronic kidney disease, stage 3b: Secondary | ICD-10-CM | POA: Diagnosis not present

## 2019-10-05 ENCOUNTER — Emergency Department (HOSPITAL_COMMUNITY): Payer: Medicare Other

## 2019-10-05 ENCOUNTER — Inpatient Hospital Stay (HOSPITAL_COMMUNITY)
Admission: EM | Admit: 2019-10-05 | Discharge: 2019-10-11 | DRG: 280 | Disposition: A | Discharge status: Skilled Nursing Facility | Payer: Medicare Other | Attending: Internal Medicine | Admitting: Internal Medicine

## 2019-10-05 DIAGNOSIS — I252 Old myocardial infarction: Secondary | ICD-10-CM

## 2019-10-05 DIAGNOSIS — R404 Transient alteration of awareness: Secondary | ICD-10-CM | POA: Diagnosis not present

## 2019-10-05 DIAGNOSIS — R627 Adult failure to thrive: Secondary | ICD-10-CM | POA: Diagnosis present

## 2019-10-05 DIAGNOSIS — I5031 Acute diastolic (congestive) heart failure: Secondary | ICD-10-CM | POA: Diagnosis present

## 2019-10-05 DIAGNOSIS — E785 Hyperlipidemia, unspecified: Secondary | ICD-10-CM | POA: Diagnosis present

## 2019-10-05 DIAGNOSIS — E876 Hypokalemia: Secondary | ICD-10-CM | POA: Diagnosis present

## 2019-10-05 DIAGNOSIS — E860 Dehydration: Secondary | ICD-10-CM | POA: Diagnosis present

## 2019-10-05 DIAGNOSIS — Z7984 Long term (current) use of oral hypoglycemic drugs: Secondary | ICD-10-CM

## 2019-10-05 DIAGNOSIS — Z951 Presence of aortocoronary bypass graft: Secondary | ICD-10-CM

## 2019-10-05 DIAGNOSIS — F0281 Dementia in other diseases classified elsewhere with behavioral disturbance: Secondary | ICD-10-CM | POA: Diagnosis present

## 2019-10-05 DIAGNOSIS — R531 Weakness: Secondary | ICD-10-CM

## 2019-10-05 DIAGNOSIS — Z955 Presence of coronary angioplasty implant and graft: Secondary | ICD-10-CM

## 2019-10-05 DIAGNOSIS — Z79899 Other long term (current) drug therapy: Secondary | ICD-10-CM

## 2019-10-05 DIAGNOSIS — N179 Acute kidney failure, unspecified: Secondary | ICD-10-CM | POA: Diagnosis not present

## 2019-10-05 DIAGNOSIS — N1832 Chronic kidney disease, stage 3b: Secondary | ICD-10-CM | POA: Diagnosis present

## 2019-10-05 DIAGNOSIS — Z20822 Contact with and (suspected) exposure to covid-19: Secondary | ICD-10-CM | POA: Diagnosis not present

## 2019-10-05 DIAGNOSIS — I447 Left bundle-branch block, unspecified: Secondary | ICD-10-CM | POA: Diagnosis present

## 2019-10-05 DIAGNOSIS — R0602 Shortness of breath: Secondary | ICD-10-CM | POA: Diagnosis not present

## 2019-10-05 DIAGNOSIS — R778 Other specified abnormalities of plasma proteins: Secondary | ICD-10-CM | POA: Diagnosis present

## 2019-10-05 DIAGNOSIS — G309 Alzheimer's disease, unspecified: Secondary | ICD-10-CM | POA: Diagnosis not present

## 2019-10-05 DIAGNOSIS — Z66 Do not resuscitate: Secondary | ICD-10-CM | POA: Diagnosis present

## 2019-10-05 DIAGNOSIS — Z6823 Body mass index (BMI) 23.0-23.9, adult: Secondary | ICD-10-CM

## 2019-10-05 DIAGNOSIS — I10 Essential (primary) hypertension: Secondary | ICD-10-CM | POA: Diagnosis not present

## 2019-10-05 DIAGNOSIS — Z9842 Cataract extraction status, left eye: Secondary | ICD-10-CM

## 2019-10-05 DIAGNOSIS — Z9841 Cataract extraction status, right eye: Secondary | ICD-10-CM

## 2019-10-05 DIAGNOSIS — M1990 Unspecified osteoarthritis, unspecified site: Secondary | ICD-10-CM | POA: Diagnosis present

## 2019-10-05 DIAGNOSIS — Z7401 Bed confinement status: Secondary | ICD-10-CM

## 2019-10-05 DIAGNOSIS — E7800 Pure hypercholesterolemia, unspecified: Secondary | ICD-10-CM | POA: Diagnosis present

## 2019-10-05 DIAGNOSIS — I130 Hypertensive heart and chronic kidney disease with heart failure and stage 1 through stage 4 chronic kidney disease, or unspecified chronic kidney disease: Secondary | ICD-10-CM | POA: Diagnosis present

## 2019-10-05 DIAGNOSIS — I214 Non-ST elevation (NSTEMI) myocardial infarction: Principal | ICD-10-CM | POA: Diagnosis present

## 2019-10-05 DIAGNOSIS — Z888 Allergy status to other drugs, medicaments and biological substances status: Secondary | ICD-10-CM

## 2019-10-05 DIAGNOSIS — Z7982 Long term (current) use of aspirin: Secondary | ICD-10-CM

## 2019-10-05 DIAGNOSIS — Z7983 Long term (current) use of bisphosphonates: Secondary | ICD-10-CM

## 2019-10-05 DIAGNOSIS — I2510 Atherosclerotic heart disease of native coronary artery without angina pectoris: Secondary | ICD-10-CM | POA: Diagnosis present

## 2019-10-05 DIAGNOSIS — N183 Chronic kidney disease, stage 3 unspecified: Secondary | ICD-10-CM | POA: Diagnosis present

## 2019-10-05 DIAGNOSIS — R6251 Failure to thrive (child): Secondary | ICD-10-CM | POA: Diagnosis present

## 2019-10-05 DIAGNOSIS — R32 Unspecified urinary incontinence: Secondary | ICD-10-CM | POA: Diagnosis present

## 2019-10-05 DIAGNOSIS — F0280 Dementia in other diseases classified elsewhere without behavioral disturbance: Secondary | ICD-10-CM | POA: Diagnosis not present

## 2019-10-05 DIAGNOSIS — Z961 Presence of intraocular lens: Secondary | ICD-10-CM | POA: Diagnosis present

## 2019-10-05 DIAGNOSIS — R52 Pain, unspecified: Secondary | ICD-10-CM | POA: Diagnosis not present

## 2019-10-05 DIAGNOSIS — I358 Other nonrheumatic aortic valve disorders: Secondary | ICD-10-CM | POA: Diagnosis present

## 2019-10-05 DIAGNOSIS — Z515 Encounter for palliative care: Secondary | ICD-10-CM

## 2019-10-05 DIAGNOSIS — Z90710 Acquired absence of both cervix and uterus: Secondary | ICD-10-CM

## 2019-10-05 DIAGNOSIS — F05 Delirium due to known physiological condition: Secondary | ICD-10-CM | POA: Diagnosis present

## 2019-10-05 DIAGNOSIS — I440 Atrioventricular block, first degree: Secondary | ICD-10-CM | POA: Diagnosis not present

## 2019-10-05 DIAGNOSIS — E1122 Type 2 diabetes mellitus with diabetic chronic kidney disease: Secondary | ICD-10-CM | POA: Diagnosis present

## 2019-10-05 DIAGNOSIS — E119 Type 2 diabetes mellitus without complications: Secondary | ICD-10-CM

## 2019-10-05 DIAGNOSIS — Z833 Family history of diabetes mellitus: Secondary | ICD-10-CM

## 2019-10-05 LAB — LIPID PANEL
Cholesterol: 204 mg/dL — ABNORMAL HIGH (ref 0–200)
HDL: 62 mg/dL (ref >40)
LDL Cholesterol: 128 mg/dL — ABNORMAL HIGH (ref 0–99)
Total CHOL/HDL Ratio: 3.3 RATIO
Triglycerides: 71 mg/dL (ref <150)
VLDL: 14 mg/dL (ref 0–40)

## 2019-10-05 LAB — CBC WITH DIFFERENTIAL/PLATELET
Abs Immature Granulocytes: 0.01 10*3/uL (ref 0.00–0.07)
Basophils Absolute: 0 10*3/uL (ref 0.0–0.1)
Basophils Relative: 0 %
Eosinophils Absolute: 0 10*3/uL (ref 0.0–0.5)
Eosinophils Relative: 0 %
HCT: 42.4 % (ref 36.0–46.0)
Hemoglobin: 12.4 g/dL (ref 12.0–15.0)
Immature Granulocytes: 0 %
Lymphocytes Relative: 29 %
Lymphs Abs: 1.6 10*3/uL (ref 0.7–4.0)
MCH: 25.1 pg — ABNORMAL LOW (ref 26.0–34.0)
MCHC: 29.2 g/dL — ABNORMAL LOW (ref 30.0–36.0)
MCV: 85.7 fL (ref 80.0–100.0)
Monocytes Absolute: 0.5 10*3/uL (ref 0.1–1.0)
Monocytes Relative: 10 %
Neutro Abs: 3.3 10*3/uL (ref 1.7–7.7)
Neutrophils Relative %: 61 %
Platelets: 170 10*3/uL (ref 150–400)
RBC: 4.95 MIL/uL (ref 3.87–5.11)
RDW: 16 % — ABNORMAL HIGH (ref 11.5–15.5)
WBC: 5.4 10*3/uL (ref 4.0–10.5)
nRBC: 0 % (ref 0.0–0.2)

## 2019-10-05 LAB — CBC
HCT: 40.4 % (ref 36.0–46.0)
Hemoglobin: 12.3 g/dL (ref 12.0–15.0)
MCH: 25.6 pg — ABNORMAL LOW (ref 26.0–34.0)
MCHC: 30.4 g/dL (ref 30.0–36.0)
MCV: 84.2 fL (ref 80.0–100.0)
Platelets: 171 10*3/uL (ref 150–400)
RBC: 4.8 MIL/uL (ref 3.87–5.11)
RDW: 16 % — ABNORMAL HIGH (ref 11.5–15.5)
WBC: 6.5 10*3/uL (ref 4.0–10.5)
nRBC: 0 % (ref 0.0–0.2)

## 2019-10-05 LAB — COMPREHENSIVE METABOLIC PANEL
ALT: 12 U/L (ref 0–44)
AST: 16 U/L (ref 15–41)
Albumin: 3.7 g/dL (ref 3.5–5.0)
Alkaline Phosphatase: 75 U/L (ref 38–126)
Anion gap: 11 (ref 5–15)
BUN: 20 mg/dL (ref 8–23)
CO2: 24 mmol/L (ref 22–32)
Calcium: 9 mg/dL (ref 8.9–10.3)
Chloride: 108 mmol/L (ref 98–111)
Creatinine, Ser: 1.48 mg/dL — ABNORMAL HIGH (ref 0.44–1.00)
GFR calc Af Amer: 35 mL/min — ABNORMAL LOW (ref >60)
GFR calc non Af Amer: 30 mL/min — ABNORMAL LOW (ref >60)
Glucose, Bld: 144 mg/dL — ABNORMAL HIGH (ref 70–99)
Potassium: 3.8 mmol/L (ref 3.5–5.1)
Sodium: 143 mmol/L (ref 135–145)
Total Bilirubin: 1 mg/dL (ref 0.3–1.2)
Total Protein: 7 g/dL (ref 6.5–8.1)

## 2019-10-05 LAB — URINALYSIS, ROUTINE W REFLEX MICROSCOPIC
Bilirubin Urine: NEGATIVE
Glucose, UA: 50 mg/dL — AB
Hgb urine dipstick: NEGATIVE
Ketones, ur: NEGATIVE mg/dL
Leukocytes,Ua: NEGATIVE
Nitrite: NEGATIVE
Protein, ur: 100 mg/dL — AB
Specific Gravity, Urine: 1.006 (ref 1.005–1.030)
pH: 8 (ref 5.0–8.0)

## 2019-10-05 LAB — TROPONIN I (HIGH SENSITIVITY)
Troponin I (High Sensitivity): 20 ng/L — ABNORMAL HIGH (ref <18)
Troponin I (High Sensitivity): 41 ng/L — ABNORMAL HIGH (ref <18)

## 2019-10-05 MED ORDER — ALENDRONATE SODIUM 70 MG PO TABS: 70.0000 mg | ORAL_TABLET | ORAL | Status: DC

## 2019-10-05 MED ORDER — CARVEDILOL 6.25 MG PO TABS
6.2500 mg | ORAL_TABLET | Freq: Two times a day (BID) | ORAL | Status: DC
  Administered 2019-10-05: 6.25 mg via ORAL
  Filled 2019-10-05: qty 1

## 2019-10-05 MED ORDER — ACETAMINOPHEN 325 MG PO TABS
650.0000 mg | ORAL_TABLET | ORAL | Status: DC | PRN
  Administered 2019-10-06 – 2019-10-11 (×6): 650 mg via ORAL
  Filled 2019-10-05 (×7): qty 2

## 2019-10-05 MED ORDER — AMLODIPINE BESYLATE 10 MG PO TABS
10.0000 mg | ORAL_TABLET | Freq: Every day | ORAL | Status: DC
  Administered 2019-10-06 – 2019-10-10 (×5): 10 mg via ORAL
  Filled 2019-10-05 (×5): qty 1

## 2019-10-05 MED ORDER — HYDRALAZINE HCL 25 MG PO TABS
50.0000 mg | ORAL_TABLET | Freq: Once | ORAL | Status: AC
  Administered 2019-10-05: 50 mg via ORAL
  Filled 2019-10-05: qty 2

## 2019-10-05 MED ORDER — ONDANSETRON HCL 4 MG/2ML IJ SOLN: 4.0000 mg | Freq: Four times a day (QID) | INTRAMUSCULAR | Status: DC | PRN

## 2019-10-05 MED ORDER — CHLORTHALIDONE 25 MG PO TABS
25.0000 mg | ORAL_TABLET | Freq: Every day | ORAL | Status: DC
  Administered 2019-10-06 – 2019-10-10 (×5): 25 mg via ORAL
  Filled 2019-10-05 (×5): qty 1

## 2019-10-05 MED ORDER — ENOXAPARIN SODIUM 30 MG/0.3ML ~~LOC~~ SOLN
30.0000 mg | Freq: Every day | SUBCUTANEOUS | Status: DC
  Filled 2019-10-05: qty 0.3

## 2019-10-05 MED ORDER — ACETAMINOPHEN 500 MG PO TABS: 500.0000 mg | ORAL_TABLET | Freq: Every day | ORAL | Status: DC

## 2019-10-05 MED ORDER — CARVEDILOL 12.5 MG PO TABS
12.5000 mg | ORAL_TABLET | Freq: Two times a day (BID) | ORAL | Status: DC
  Administered 2019-10-06 – 2019-10-11 (×11): 12.5 mg via ORAL
  Filled 2019-10-05 (×11): qty 1

## 2019-10-05 MED ORDER — LORAZEPAM 1 MG PO TABS: 0.5000 mg | ORAL_TABLET | Freq: Once | ORAL | Status: DC

## 2019-10-05 MED ORDER — HYDRALAZINE HCL 50 MG PO TABS
50.0000 mg | ORAL_TABLET | Freq: Three times a day (TID) | ORAL | Status: DC
  Administered 2019-10-06: 50 mg via ORAL
  Filled 2019-10-05: qty 1

## 2019-10-05 MED ORDER — ALPRAZOLAM 0.25 MG PO TABS
0.5000 mg | ORAL_TABLET | Freq: Once | ORAL | Status: AC
  Administered 2019-10-05: 0.5 mg via ORAL
  Filled 2019-10-05: qty 2

## 2019-10-05 MED ORDER — VITAMIN D 25 MCG (1000 UNIT) PO TABS
2000.0000 [IU] | ORAL_TABLET | Freq: Every day | ORAL | Status: DC
  Administered 2019-10-06 – 2019-10-11 (×5): 2000 [IU] via ORAL
  Filled 2019-10-05 (×5): qty 2

## 2019-10-05 MED ORDER — METFORMIN HCL 500 MG PO TABS: 500.0000 mg | ORAL_TABLET | Freq: Every day | ORAL | Status: DC

## 2019-10-05 MED ORDER — TRIAMCINOLONE ACETONIDE 0.1 % EX CREA
1.0000 "application " | TOPICAL_CREAM | Freq: Two times a day (BID) | CUTANEOUS | Status: DC
  Administered 2019-10-06 – 2019-10-11 (×9): 1 via TOPICAL
  Filled 2019-10-05: qty 15

## 2019-10-05 MED ORDER — PANTOPRAZOLE SODIUM 40 MG PO TBEC
40.0000 mg | DELAYED_RELEASE_TABLET | Freq: Every day | ORAL | Status: DC
  Administered 2019-10-06 – 2019-10-11 (×6): 40 mg via ORAL
  Filled 2019-10-05 (×6): qty 1

## 2019-10-05 MED ORDER — OLANZAPINE 5 MG PO TBDP
5.0000 mg | ORAL_TABLET | Freq: Once | ORAL | Status: AC
  Administered 2019-10-05: 5 mg via ORAL
  Filled 2019-10-05: qty 1

## 2019-10-05 MED ORDER — GABAPENTIN 300 MG PO CAPS
600.0000 mg | ORAL_CAPSULE | Freq: Every day | ORAL | Status: DC
  Administered 2019-10-06 – 2019-10-10 (×5): 600 mg via ORAL
  Filled 2019-10-05 (×5): qty 2

## 2019-10-05 MED ORDER — SODIUM CHLORIDE 0.9 % IV SOLN: INTRAVENOUS | Status: DC

## 2019-10-05 MED ORDER — PRAVASTATIN SODIUM 40 MG PO TABS
40.0000 mg | ORAL_TABLET | Freq: Every day | ORAL | Status: DC
  Administered 2019-10-06 – 2019-10-11 (×6): 40 mg via ORAL
  Filled 2019-10-05 (×6): qty 1

## 2019-10-05 MED ORDER — ASPIRIN EC 81 MG PO TBEC
81.0000 mg | DELAYED_RELEASE_TABLET | Freq: Every day | ORAL | Status: DC
  Administered 2019-10-06 – 2019-10-10 (×5): 81 mg via ORAL
  Filled 2019-10-05 (×5): qty 1

## 2019-10-05 MED ORDER — ALPRAZOLAM 0.5 MG PO TABS
0.5000 mg | ORAL_TABLET | Freq: Three times a day (TID) | ORAL | Status: DC | PRN
  Administered 2019-10-06 – 2019-10-11 (×5): 0.5 mg via ORAL
  Filled 2019-10-05 (×5): qty 1

## 2019-10-05 NOTE — ED Notes (Signed)
Pt having conversations with people not in the room, difficult to follow commands.  PA notified.

## 2019-10-05 NOTE — TOC Initial Note (Signed)
Transition of Care [facility]) - Initial/Assessment Note    Patient Details  Name: [place] M Muma MRN: [id number] Date of Birth: [birth year]  Transition of [facility]) CM/SW Contact:    Sherie Don, LCSW Phone Number: 10/05/2019, 6:12 PM  Clinical Narrative: Received consult for patient to be assessed for SNF placement. Met with patient to complete TOC initial assessment. Due to dementia, patient was a poor historian and reported she was seeing other people in the room with CSWs. CSWs also spoke with patient's sister, Meryl Crutch, after patient's husband, Toluwani Ruder, was unable to answer the phone to speak with CSWs. Sister reported patient's ability to complete self-care has deteriorated as evidenced by sister finding the patient sitting in her diarrhea when she comes by the home to check on the patient, urinating around the home, and attempting to use the stove to cook. Sister also reported issues with patient and her husband being able to pay their bills and patient exhibiting concerning behavior such as throwing chairs in the home.  CSW called APS to file a report and spoke with Andrika. A report was filed due to safety concerns with patient's ability to perform her ADLs in the home, inability to pay bills, and concerns about the safety of the home environment due to a recent fall that brought patient to the ED on 09/20/2019.        Expected Discharge Plan: Skilled Nursing Facility Barriers to Discharge: Continued Medical Work up  Patient Goals and CMS Choice Patient states their goals for this hospitalization and ongoing recovery are:: To return home CMS Medicare.gov Compare Post Acute Care list provided to:: Patient Represenative (must comment)(Ethel (sister) PH: [phone]) Choice offered to / list presented to : Meryl Crutch (sister) [facility]: [phone])  Expected Discharge Plan and Services Expected Discharge Plan: [place] Choice: [place] arrangements for the past 2 months: Single Family Home                  Prior Living Arrangements/Services Living arrangements for the past 2 months: Single Family Home Lives with:: Spouse Patient language and need for interpreter reviewed:: Yes Do you feel safe going back to the place where you live?: No   Per sister's report, patient lives with husband and they are currently unable to care for themselves or each other  Need for Family Participation in Patient Care: Yes (Comment)(Patient has dementia and is poor historian) Care giver support system in place?: Yes (comment)(Ethel (sister) PH: [phone])   Criminal Activity/Legal Involvement Pertinent to Current Situation/Hospitalization: No - Comment as needed  Activities of Daily Living    Permission Sought/Granted    Emotional Assessment Appearance:: Appears stated age Attitude/Demeanor/Rapport: Other (comment)(Patient was confused and unable to remember her name at times) Affect (typically observed): Anxious, Overwhelmed, Tearful/Crying Orientation: : Fluctuating Orientation (Suspected and/or reported Sundowners)   Psych Involvement: No (comment)  Admission diagnosis:  Weakness Patient Active Problem List   Diagnosis Date Noted  . Hypernatremia 07/18/2019  . Hypokalemia 07/18/2019  . Elevated troponin 07/17/2019  . Acute lower UTI 07/17/2019  . Positive D dimer 07/17/2019  . Normocytic anemia 07/17/2019  . Cholelithiasis 01/02/2019  . CKD (chronic kidney disease), stage III 10/21/2017  . Delirium due to another medical condition 12/18/2016  . SBO (small bowel obstruction) ([place]) 12/18/2016  . 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 Leukocytosis 12/18/2016  . Peripheral artery disease ([place]) 11/08/2014  . PAD (peripheral artery disease) ([place]) 10/24/2014  .  LBBB (left bundle branch block) 09/27/2014    Class: Chronic  . Subclavian artery stenosis, left ([place]) 09/27/2014  . Numbness and tingling in left  hand 03/08/2013  . HTN (hypertension) 03/08/2013  . DM (diabetes mellitus) ([place]) 03/08/2013  . CAD (coronary artery disease) 03/08/2013   PCP:  Velna Hatchet, MD Pharmacy:   [id number] [address] [place]Phone: [phone]: [phone]  Readmission Risk Interventions No flowsheet data found.

## 2019-10-05 NOTE — Progress Notes (Signed)
CSW was able to contact Pts son and wife via phone @ [phone]. Son and wife are located in [place] and are unable to physically cone to hospital. CSW will update via email and phone with information and resources for memory care as well as SNF options.  CSW will also update Pts chart with son's contact information.

## 2019-10-05 NOTE — ED Triage Notes (Signed)
Pt presents after family called EMS for generalized weakness, poor appetite, ambulation and self cares over the last 2 weeks.  Saw PMD yesterday and urine sample was taken.  Pt has hx of dementia and poor historian.  Pt denies any pain or SOB, states she feels like herself.  Pt in NAD, breathing wnl.

## 2019-10-05 NOTE — ED Notes (Signed)
Pt continues to shiver and state  "I'm so sick" but is unable to give any other information. Medication given as order and warm blankets provided to pt.

## 2019-10-05 NOTE — ED Provider Notes (Signed)
I assumed care of patient at shift change from previous team, please see their note for full H&P. Briefly patient is here for evaluation of generally feeling weak.  She has Alzheimer's dementia which has reportedly been getting worse especially with sundowning. She has reportedly been throwing chairs and urinating on the floor and there is concern about her husband's safety.   Physical Exam  BP (!) 109/93   Pulse 80   Temp 97.6 F (36.4 C) (Oral)   Resp 19   SpO2 97%   Physical Exam HENT:     Head: Normocephalic.  Cardiovascular:     Rate and Rhythm: Normal rate.  Pulmonary:     Effort: Pulmonary effort is normal. No respiratory distress.  Abdominal:     General: Abdomen is flat.  Neurological:     Mental Status: She is alert.     Comments: Patient is awake, alert.  She keeps repeating "I feel sick."  She is unable to answer questions.     ED Course/Procedures   Clinical Course as of Oct 04 2212  Tue Oct 05, 2019  1932 I spoke with Dr   [EH]    Clinical Course User Index [EH] Cristina Gong, PA-C    Procedures   Labs Reviewed  URINALYSIS, ROUTINE W REFLEX MICROSCOPIC - Abnormal; Notable for the following components:      Result Value   Color, Urine STRAW (*)    Glucose, UA 50 (*)    Protein, ur 100 (*)    Bacteria, UA RARE (*)    All other components within normal limits  CBC WITH DIFFERENTIAL/PLATELET - Abnormal; Notable for the following components:   MCH 25.1 (*)    MCHC 29.2 (*)    RDW 16.0 (*)    All other components within normal limits  COMPREHENSIVE METABOLIC PANEL - Abnormal; Notable for the following components:   Glucose, Bld 144 (*)    Creatinine, Ser 1.48 (*)    GFR calc non Af Amer 30 (*)    GFR calc Af Amer 35 (*)    All other components within normal limits  TROPONIN I (HIGH SENSITIVITY) - Abnormal; Notable for the following components:   Troponin I (High Sensitivity) 20 (*)    All other components within normal limits  TROPONIN I  (HIGH SENSITIVITY) - Abnormal; Notable for the following components:   Troponin I (High Sensitivity) 41 (*)    All other components within normal limits  URINE CULTURE  SARS CORONAVIRUS 2 (TAT 6-24 HRS)  CBC  CREATININE, SERUM  LIPID PANEL  TROPONIN I (HIGH SENSITIVITY)     MDM  Plan to follow up on delta troponin, TOC team  Delta troponin is normal.  TOC team requested PT.OT consult which was ordered.  1710: RN reports patient is becoming increasingly agitated. Orders placed for med tech to prioritize review, based on history it appears she takes xanax as needed at home per chart and [place] PMP review.  This was ordered.  We will continue to monitor.  Initial troponin was 20, delta troponin was 41.  While in the emergency room patient continued to be agitated, EKG reviewed and she is given olanzapine after discussion with Dr. Adela Lank.  Given that patient reportedly earlier did not voice any chest pain, and is not currently able to indicate if she has chest pain or not patient will be admitted to trend troponins.  I spoke with her husband who states that he would want things done up until  the point when she is unable to breathe on her own or her heart stops beating in which case she would not want intubation or CPR.  I spoke with Dr. Jonelle Sidle who will see the patient for admission.  Note: Portions of this report may have been transcribed using voice recognition software. Every effort was made to ensure accuracy; however, inadvertent computerized transcription errors may be present   Remained hemodynamically stable while in my care.          Lorin Glass, PA-C 10/05/19 Hawkins, [place], DO 10/05/19 2220

## 2019-10-05 NOTE — [place] FL2 (Signed)
[place] MEDICAID FL2 LEVEL OF CARE SCREENING TOOL     IDENTIFICATION  Patient Name: Sonya Chavez Birthdate: [birth year] Sex: female Admission Date (Current Location): 10/05/2019  [facility] and [place] Number:  Producer, television/film/video and Address:  [facility], [address] [place], [place] [address]      Provider Number: [id number]  Attending Physician Name and Address:  Rometta Emery, MD  Relative Name and Phone Number:  [name] (sister) PH:[phone]    Current Level of Care: Hospital Recommended Level of Care: Skilled Nursing Facility Prior Approval Number:    Date Approved/Denied:   PASRR Number:    Discharge Plan: SNF    Current Diagnoses: Patient Active Problem List   Diagnosis Date Noted  . Weakness generalized 10/05/2019  . Hypernatremia 07/18/2019  . Hypokalemia 07/18/2019  . Elevated troponin 07/17/2019  . Acute lower UTI 07/17/2019  . Positive D dimer 07/17/2019  . Normocytic anemia 07/17/2019  . Cholelithiasis 01/02/2019  . CKD (chronic kidney disease), stage III 10/21/2017  . Delirium due to another medical condition 12/18/2016  . SBO (small bowel obstruction) (HCC) 12/18/2016  . 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 Leukocytosis 12/18/2016  . Peripheral artery disease (HCC) 11/08/2014  . PAD (peripheral artery disease) (HCC) 10/24/2014  . LBBB (left bundle branch block) 09/27/2014  . Subclavian artery stenosis, left (HCC) 09/27/2014  . Numbness and tingling in left hand 03/08/2013  . HTN (hypertension) 03/08/2013  . DM (diabetes mellitus) (HCC) 03/08/2013  . CAD (coronary artery disease) 03/08/2013    Orientation RESPIRATION BLADDER Height & Weight     (Orientation fluctuates and patient could not remember her name at times)  Normal Incontinent Weight:   Height:     BEHAVIORAL SYMPTOMS/MOOD NEUROLOGICAL BOWEL NUTRITION STATUS      Incontinent    AMBULATORY STATUS COMMUNICATION OF NEEDS Skin   Limited  Assist Verbally Normal                       Personal Care Assistance Level of Assistance  Bathing, Dressing Bathing Assistance: Limited assistance   Dressing Assistance: Limited assistance     Functional Limitations Info             SPECIAL CARE FACTORS FREQUENCY                       Contractures Contractures Info: Not present    Additional Factors Info  Allergies   Allergies Info: Statins: Crestor (Rosuvastatin), Lipitor (Atorvastatin), Zocor (Simvastatin)           Current Medications (10/05/2019):  This is the current hospital active medication list Current Facility-Administered Medications  Medication Dose Route Frequency Provider Last Rate Last Admin  . 0.9 %  sodium chloride infusion   Intravenous Continuous Rometta Emery, MD      . Melene Muller ON 10/06/2019] acetaminophen (TYLENOL) tablet 500-1,000 mg  500-1,000 mg Oral Daily Earlie Lou L, MD      . acetaminophen (TYLENOL) tablet 650 mg  650 mg Oral Q4H PRN Rometta Emery, MD      . Melene Muller ON 10/11/2019] alendronate (FOSAMAX) tablet 70 mg  70 mg Oral Q Mon Garba, Mohammad L, MD      . ALPRAZolam Prudy Feeler) tablet 0.5 mg  0.5 mg Oral TID PRN Earlie Lou L, MD      . amLODipine (NORVASC) tablet 10 mg  10 mg Oral QHS Rometta Emery, MD      .  aspirin EC tablet 81 mg  81 mg Oral QHS Garba, Mohammad L, MD      . carvedilol (COREG) tablet 12.5 mg  12.5 mg Oral BID Gala Romney L, MD      . carvedilol (COREG) tablet 6.25 mg  6.25 mg Oral BID WC Gala Romney L, MD   6.25 mg at 10/05/19 1701  . [START ON 10/06/2019] chlorthalidone (HYGROTON) tablet 25 mg  25 mg Oral Daily Garba, Mohammad L, MD      . enoxaparin (LOVENOX) injection 30 mg  30 mg Subcutaneous Q24H Garba, Mohammad L, MD      . gabapentin (NEURONTIN) capsule 600 mg  600 mg Oral QHS Garba, Mohammad L, MD      . hydrALAZINE (APRESOLINE) tablet 50 mg  50 mg Oral TID Gala Romney L, MD      . metFORMIN (GLUCOPHAGE) tablet 500 mg  500 mg  Oral QHS Jonelle Sidle, Mohammad L, MD      . ondansetron (ZOFRAN) injection 4 mg  4 mg Intravenous Q6H PRN Elwyn Reach, MD      . Derrill Memo ON 10/06/2019] pantoprazole (PROTONIX) EC tablet 40 mg  40 mg Oral Daily Elwyn Reach, MD      . Derrill Memo ON 10/06/2019] pravastatin (PRAVACHOL) tablet 40 mg  40 mg Oral q1800 Garba, Mohammad L, MD      . triamcinolone cream (KENALOG) 0.1 % 1 application  1 application Topical BID Elwyn Reach, MD      . Derrill Memo ON 10/06/2019] Vitamin D3 TABS 1 tablet  1 tablet Oral Daily Elwyn Reach, MD       Current Outpatient Medications  Medication Sig Dispense Refill  . acetaminophen (TYLENOL) 500 MG tablet Take 500-1,000 mg by mouth daily.    Marland Kitchen alendronate (FOSAMAX) 70 MG tablet Take 70 mg by mouth every Monday.     . ALPRAZolam (XANAX) 0.5 MG tablet Take 0.5 mg by mouth 3 (three) times daily as needed.    Marland Kitchen amLODipine (NORVASC) 10 MG tablet Take 10 mg by mouth at bedtime.     Marland Kitchen aspirin EC 81 MG tablet Take 81 mg by mouth at bedtime.     . carvedilol (COREG) 6.25 MG tablet Take 12.5 mg by mouth 2 (two) times daily.    . chlorthalidone (HYGROTON) 25 MG tablet Take 25 mg by mouth daily.    . Cholecalciferol (VITAMIN D3) 50 MCG (2000 UT) TABS Take 1 tablet by mouth daily.    Marland Kitchen gabapentin (NEURONTIN) 300 MG capsule Take 600 mg by mouth at bedtime.     . hydrALAZINE (APRESOLINE) 50 MG tablet Take 1 tablet (50 mg total) by mouth 3 (three) times daily. 90 tablet 3  . lovastatin (MEVACOR) 40 MG tablet Take 1 tablet (40 mg total) by mouth every evening. 30 tablet 11  . metFORMIN (GLUCOPHAGE) 500 MG tablet Take 500 mg by mouth at bedtime.    . pantoprazole (PROTONIX) 40 MG tablet Take 40 mg by mouth daily.    Marland Kitchen triamcinolone cream (KENALOG) 0.1 % Apply 1 application topically 2 (two) times daily.       Discharge Medications: Please see discharge summary for a list of discharge medications.  Relevant Imaging Results:  Relevant Lab Results:   Additional  Information SSN#: [id number]  Sherie Don, LCSW

## 2019-10-05 NOTE — Social Work (Signed)
EDCSWs met with Pt at bedside. Pt was confused at to whereabouts but was calmed with speech. CSWs will continue to follow.

## 2019-10-05 NOTE — ED Notes (Signed)
Pt shivering and repeatedly saying that "I'm so sick", but cannot pinpoint any sx.  Denies any pain, or nausea.  Warm blankets applied without relief of sx.

## 2019-10-05 NOTE — ED Notes (Signed)
Pt continues to be restless and will not take PO medications at this time. This RN has attempted to reorient the pt but the will not take them. Pt just states " Im so sick, the medicine makes me sick."

## 2019-10-05 NOTE — Evaluation (Signed)
Physical Therapy Evaluation Patient Details Name: Sonya Chavez MRN: [id number] DOB: Dec 14, 1925 Today's Date: 10/05/2019   History of Present Illness  Pt is 86 yo female with PMH for dementia, CAD, CABG, DMII, HTN, HLD, ACS, and SBO who presented to ED with poor appetite and generalized weakness.    Clinical Impression  Pt admitted with above diagnosis. Pt required min-mod A for transfers and to ambulate 19' with HHA.  She was unsteady and a fall risk.  Pt had difficulty with MMT commands but otherwise was able to participate with PT.  Pt is unsafe to return home with family from PT perspective and would need SNF level of care.  From prior notes (~9 months ago)  pt was min A and demonstrated better balance/ safety; would benefit from SNF to return to PLOF.  Pt may need long term placement in memory care unit in future due to dementia.  Pt currently with functional limitations due to the deficits listed below (see PT Problem List). Pt will benefit from skilled PT to increase their independence and safety with mobility to allow discharge to the venue listed below.       Follow Up Recommendations SNF;Other (comment)(Pt needs SNF level of care at this time and would likely benefit from long term memory care)    Equipment Recommendations  None recommended by PT    Recommendations for Other Services       Precautions / Restrictions Precautions Precautions: Fall      Mobility  Bed Mobility Overal bed mobility: Needs Assistance Bed Mobility: Supine to Sit;Sit to Supine     Supine to sit: Mod assist Sit to supine: Mod assist   General bed mobility comments: increased time and cues  Transfers Overall transfer level: Needs assistance Equipment used: 1 person hand held assist Transfers: Sit to/from Stand Sit to Stand: Min assist         General transfer comment: cues and assist for safety  Ambulation/Gait Ambulation/Gait assistance: Mod assist Gait Distance (Feet): 50  Feet Assistive device: 1 person hand held assist Gait Pattern/deviations: Decreased stride length;Staggering left;Staggering right;Shuffle;Narrow base of support Gait velocity: decreased   General Gait Details: Reports does not use RW; required HHA of 1 and was unsteady with multiple LOB requiring mod A for balance  Stairs            Wheelchair Mobility    Modified Rankin (Stroke Patients Only)       Balance Overall balance assessment: Needs assistance Sitting-balance support: No upper extremity supported;Feet unsupported Sitting balance-Leahy Scale: Fair     Standing balance support: Single extremity supported;During functional activity Standing balance-Leahy Scale: Poor                               Pertinent Vitals/Pain Pain Assessment: No/denies pain    Home Living Family/patient expects to be discharged to:: Unsure Living Arrangements: Spouse/significant other Available Help at Discharge: Family;Available 24 hours/day Type of Home: House Home Access: Stairs to enter Entrance Stairs-Rails: Psychiatric nurse of Steps: 3 Home Layout: One level Home Equipment: Walker - 2 wheels Additional Comments: Pt unable to provide - home environment from prior note.    Prior Function           Comments: Pt unable to provide.  From note 9 months ago she was independent.  Per ED notes and family: pt with increasing weakness and family having difficulty managing her at home due  to dementia.  Pt states does not use RW.     Hand Dominance        Extremity/Trunk Assessment   Upper Extremity Assessment Upper Extremity Assessment: Difficult to assess due to impaired cognition;Generalized weakness    Lower Extremity Assessment Lower Extremity Assessment: Difficult to assess due to impaired cognition;Generalized weakness    Cervical / Trunk Assessment Cervical / Trunk Assessment: Normal  Communication   Communication: No difficulties   Cognition Arousal/Alertness: Awake/alert Behavior During Therapy: WFL for tasks assessed/performed Overall Cognitive Status: No family/caregiver present to determine baseline cognitive functioning                                 General Comments: Pt following ~75% of commands.  She was pleasant and cooperative with therapy.  Oriented to self only with increased time.  Pt not able to answer questions and on tangential topics (listing off family members)      General Comments General comments (skin integrity, edema, etc.): VSS; RN present    Exercises     Assessment/Plan    PT Assessment Patient needs continued PT services  PT Problem List Decreased strength;Decreased mobility;Decreased safety awareness;Decreased coordination;Decreased knowledge of precautions;Decreased activity tolerance;Decreased balance;Decreased knowledge of use of DME       PT Treatment Interventions DME instruction;Therapeutic activities;Gait training;Therapeutic exercise;Patient/family education;Balance training;Stair training;Functional mobility training    PT Goals (Current goals can be found in the Care Plan section)  Acute Rehab PT Goals Patient Stated Goal: unable to state PT Goal Formulation: Patient unable to participate in goal setting Time For Goal Achievement: 10/19/19 Potential to Achieve Goals: Good    Frequency Min 3X/week   Barriers to discharge Decreased caregiver support(spouse elderly and unable to provide assist (per chart))      Co-evaluation               AM-PAC PT "6 Clicks" Mobility  Outcome Measure Help needed turning from your back to your side while in a flat bed without using bedrails?: A Lot Help needed moving from lying on your back to sitting on the side of a flat bed without using bedrails?: A Lot Help needed moving to and from a bed to a chair (including a wheelchair)?: A Little Help needed standing up from a chair using your arms (e.g., wheelchair  or bedside chair)?: A Little Help needed to walk in hospital room?: A Lot Help needed climbing 3-5 steps with a railing? : A Lot 6 Click Score: 14    End of Session Equipment Utilized During Treatment: Gait belt Activity Tolerance: Patient tolerated treatment well Patient left: in bed;with call bell/phone within reach;with nursing/sitter in room Nurse Communication: Mobility status PT Visit Diagnosis: Muscle weakness (generalized) (M62.81);Unsteadiness on feet (R26.81)    Time: 2162-4469 PT Time Calculation (min) (ACUTE ONLY): 24 min   Charges:   PT Evaluation $PT Eval Low Complexity: 1 Low          Royetta Asal, PT Acute Rehab Services Pager [phone] [facility] [phone] [facility] [phone]   Rayetta Humphrey 10/05/2019, 5:55 PM

## 2019-10-05 NOTE — H&P (Signed)
History and Physical   Sonya [place] M Eve IDP:[id number] DOB: 06-06-1926 DOA: 10/05/2019  Referring MD/NP/PA: Deno Etienne, MD  PCP: Velna Hatchet, MD   Outpatient Specialists: None  Patient coming from: Home  Chief Complaint: Generalized weakness and failure to thrive  HPI: Sonya Chavez is a 86 y.o. female with medical history significant of coronary artery disease, hypertension, hyperlipidemia, diabetes, osteoarthritis who was brought in by family due to failure to thrive at home.  Patient was generally weak and debilitated.  She is unable to give adequate history.  History obtained over the phone with family member.  She is however initially bedbound in the last few weeks.  Her initial evaluation only shows escalating troponin which is positive.  Not sure if this is new or old.  There is concern for possible cardiac involvement.  Patient's family member indicated history of partial DNR.  He does not want intubation or CPR but patient is therefore being admitted with possible NSTEMI but will have observation.  Ultimate goal is for placement in a skilled facility as she is failing to thrive at home..  ED Course: Temperature 97.6 blood pressure 200/75 pulse 80 respirate 23 oxygen sat 95% on room air.  Chemistry relatively normal except for glucose 144.  Initial troponin was 20 and then second was 41.  EKG showed no acute findings.  Patient being admitted for observation to rule out MI  Review of Systems: As per HPI otherwise 10 point review of systems negative.    Past Medical History:  Diagnosis Date  . Arthritis    "qwhere"  . Coronary artery disease   . Hypercholesteremia   . Hypertension   . Myocardial infarction ([place]) 2003  . SBO (small bowel obstruction) ([place]) 12/18/2016  . Stenosis of subclavian artery ([place])   . Type II diabetes mellitus ([place])     Past Surgical History:  Procedure Laterality Date  . ABDOMINAL HYSTERECTOMY  1970  . BOWEL RESECTION N/A 12/20/2016     Procedure: SMALL BOWEL RESECTION;  Surgeon: Coralie Keens, MD;  Location: [facility];  Service: General;  Laterality: N/A;  . CARDIAC CATHETERIZATION    . CATARACT EXTRACTION Bilateral 2000's  . CORONARY ARTERY BYPASS GRAFT  2003   in [place], [place]  . LAPAROTOMY N/A 12/20/2016   Procedure: EXPLORATORY LAPAROTOMY;  Surgeon: Coralie Keens, MD;  Location: [facility];  Service: General;  Laterality: N/A;  . LEFT HEART CATHETERIZATION WITH CORONARY/GRAFT ANGIOGRAM N/A 09/27/2014   Procedure: LEFT HEART CATHETERIZATION WITH Beatrix Fetters;  Surgeon: Laverda Page, MD;  Location[facility] C[facility] LAB;  Service: Cardiovascular;  Laterality: N/A;  . UNILATERAL UPPER EXTREMEITY ANGIOGRAM N/A 09/27/2014   Procedure: SUBCLAVIAN ARTERIOGRAM ;  Surgeon: Laverda Page, MD;  Location: [facility] CATH LAB;  Service: Cardiovascular;  Laterality: N/A;  . UNILATERAL UPPER EXTREMEITY ANGIOGRAM N/A 11/09/2014   Procedure: Left subclavian stent ;  Surgeon: Adrian Prows, MD;  Location: [facility] CATH LAB;  Service: Cardiovascular;  Laterality: N/A;  . VISCERAL ANGIOGRAM N/A 10/25/2014   Procedure: Blanchard Kelch;  Surgeon: Adrian Prows, MD;  Location: [facility] CATH LAB;  Service: Cardiovascular;  Laterality: N/A;     reports that she has never smoked. She has never used smokeless tobacco. She reports that she does not drink alcohol or use drugs.  Allergies  Allergen Reactions  . Statins Other (See Comments)    Myalgias   . Other     NO FOODS WITH SEEDS  . Crestor [Rosuvastatin] Other (See Comments)  Myalgias  . Lipitor [Atorvastatin] Other (See Comments)    myalgias  . Zocor [Simvastatin] Other (See Comments)    myalgias    Family History  Problem Relation Age of Onset  . Diabetes Father   . Gallbladder disease Neg Hx      Prior to Admission medications   Medication Sig Start Date End Date Taking? Authorizing Provider  acetaminophen (TYLENOL) 500 MG tablet Take 500-1,000 mg by mouth daily.   Yes [provider]  alendronate (FOSAMAX) 70 MG tablet Take 70 mg by mouth every Monday.  12/08/14  Yes [provider]  ALPRAZolam Prudy Feeler) 0.5 MG tablet Take 0.5 mg by mouth 3 (three) times daily as needed. 10/01/19  Yes [provider]  amLODipine (NORVASC) 10 MG tablet Take 10 mg by mouth at bedtime.  10/12/14  Yes [provider]  aspirin EC 81 MG tablet Take 81 mg by mouth at bedtime.    Yes [provider]  carvedilol (COREG) 6.25 MG tablet Take 12.5 mg by mouth 2 (two) times daily. 10/14/18  Yes [provider]  chlorthalidone (HYGROTON) 25 MG tablet Take 25 mg by mouth daily. 03/17/19  Yes [provider]  Cholecalciferol (VITAMIN D3) 50 MCG (2000 UT) TABS Take 1 tablet by mouth daily.   Yes [provider]  gabapentin (NEURONTIN) 300 MG capsule Take 600 mg by mouth at bedtime.  04/09/17  Yes [provider]  hydrALAZINE (APRESOLINE) 50 MG tablet Take 1 tablet (50 mg total) by mouth 3 (three) times daily. 08/27/19 11/25/19 Yes Toniann Fail, NP  lovastatin (MEVACOR) 40 MG tablet Take 1 tablet (40 mg total) by mouth every evening. 08/27/19  Yes Toniann Fail, NP  metFORMIN (GLUCOPHAGE) 500 MG tablet Take 500 mg by mouth at bedtime. 06/01/19  Yes [provider]  pantoprazole (PROTONIX) 40 MG tablet Take 40 mg by mouth daily. 07/04/19  Yes [provider]  triamcinolone cream (KENALOG) 0.1 % Apply 1 application topically 2 (two) times daily. 06/28/19  Yes [provider]    Physical Exam: Vitals:   10/05/19 1600 10/05/19 1630 10/05/19 1700 10/05/19 1830  BP: (!) 170/105 (!) 180/67 (!) 178/79 (!) 144/100  Pulse:  80    Resp: (!) 22 (!) 22 (!) 21 (!) 23  Temp:      TempSrc:      SpO2:  98%        Constitutional: Frail, weak, chronically ill looking, laying in bed Vitals:   10/05/19 1600 10/05/19 1630 10/05/19 1700 10/05/19 1830  BP: (!) 170/105 (!) 180/67 (!) 178/79 (!) 144/100    Pulse:  80    Resp: (!) 22 (!) 22 (!) 21 (!) 23  Temp:      TempSrc:      SpO2:  98%     Eyes: PERRL, lids and conjunctivae normal ENMT: Mucous membranes are dry. Posterior pharynx clear of any exudate or lesions.Normal dentition.  Neck: normal, supple, no masses, no thyromegaly Respiratory: clear to auscultation bilaterally, no wheezing, no crackles. Normal respiratory effort. No accessory muscle use.  Cardiovascular: Regular rate and rhythm, no murmurs / rubs / gallops. No extremity edema. 2+ pedal pulses. No carotid bruits.  Abdomen: no tenderness, no masses palpated. No hepatosplenomegaly. Bowel sounds positive.  Musculoskeletal: no clubbing / cyanosis. No joint deformity upper and lower extremities. Good ROM, no contractures. Normal muscle tone.  Skin: no rashes, lesions, ulcers. No induration Neurologic: CN 2-12 grossly intact. Sensation intact, DTR normal. Strength  5/5 in all 4.  Psychiatric: Awake, not communicating, appears withdrawn.     Labs on Admission: I have personally reviewed following labs and imaging studies  CBC: Recent Labs  Lab 10/05/19 1400  WBC 5.4  NEUTROABS 3.3  HGB 12.4  HCT 42.4  MCV 85.7  PLT 170   Basic Metabolic Panel: Recent Labs  Lab 10/05/19 1400  NA 143  K 3.8  CL 108  CO2 24  GLUCOSE 144*  BUN 20  CREATININE 1.48*  CALCIUM 9.0   GFR: CrCl cannot be calculated (Unknown ideal weight.). Liver Function Tests: Recent Labs  Lab 10/05/19 1400  AST 16  ALT 12  ALKPHOS 75  BILITOT 1.0  PROT 7.0  ALBUMIN 3.7   No results for input(s): LIPASE, AMYLASE in the last 168 hours. No results for input(s): AMMONIA in the last 168 hours. Coagulation Profile: No results for input(s): INR, PROTIME in the last 168 hours. Cardiac Enzymes: No results for input(s): CKTOTAL, CKMB, CKMBINDEX, TROPONINI in the last 168 hours. BNP (last 3 results) No results for input(s): PROBNP in the last 8760 hours. HbA1C: No results for input(s): HGBA1C  in the last 72 hours. CBG: No results for input(s): GLUCAP in the last 168 hours. Lipid Profile: No results for input(s): CHOL, HDL, LDLCALC, TRIG, CHOLHDL, LDLDIRECT in the last 72 hours. Thyroid Function Tests: No results for input(s): TSH, T4TOTAL, FREET4, T3FREE, THYROIDAB in the last 72 hours. Anemia Panel: No results for input(s): VITAMINB12, FOLATE, FERRITIN, TIBC, IRON, RETICCTPCT in the last 72 hours. Urine analysis:    Component Value Date/Time   COLORURINE STRAW (A) 10/05/2019 1420   APPEARANCEUR CLEAR 10/05/2019 1420   LABSPEC 1.006 10/05/2019 1420   PHURINE 8.0 10/05/2019 1420   GLUCOSEU 50 (A) 10/05/2019 1420   HGBUR NEGATIVE 10/05/2019 1420   BILIRUBINUR NEGATIVE 10/05/2019 1420   KETONESUR NEGATIVE 10/05/2019 1420   PROTEINUR 100 (A) 10/05/2019 1420   UROBILINOGEN 0.2 06/03/2013 1938   NITRITE NEGATIVE 10/05/2019 1420   LEUKOCYTESUR NEGATIVE 10/05/2019 1420   Sepsis Labs: @LABRCNTIP (procalcitonin:4,lacticidven:4) )No results found for this or any previous visit (from the past 240 hour(s)).   Radiological Exams on Admission: DG Chest 2 View  Result Date: 10/05/2019 CLINICAL DATA:  Shortness of breath. EXAM: CHEST - 2 VIEW COMPARISON:  July 16, 2019. FINDINGS: Stable cardiomediastinal silhouette. Atherosclerosis of thoracic aorta is noted. Sternotomy wires are noted. No pneumothorax or pleural effusion is noted. No acute pulmonary disease is noted. Bony thorax is unremarkable. IMPRESSION: No active cardiopulmonary disease. Aortic Atherosclerosis (ICD10-I70.0). Electronically Signed   By: July 18, 2019 M.D.   On: 10/05/2019 14:49    EKG: Independently reviewed.  It shows sinus rhythm with no significant changes.  Assessment/Plan Active Problems:   Weakness generalized     #1 generalized weakness: Patient will be admitted for observation.  PT and OT consultation.  She has failed to thrive at home.  Placement will be planned to skilled nursing  facility.  #2 elevated troponin: Patient is a poor historian and not able to give adequate history.  She has history of coronary artery disease.  At this point appears to also have acute kidney injury with decreased GFR which may have popliteal for elevation.  We will admit for observation and cycle enzymes.  Get echocardiogram.  Monitor response.  #3 acute kidney injury: Hydrate and monitor urine function and creatinine.  #4 hypertension: Continue blood pressure control with home regimen.  Currently markedly elevated.  #5 hyperlipidemia: Initiate and maintain  statin if tolerated.     DVT prophylaxis: Lovenox Code Status: DNR Family Communication: Daughter over the phone Disposition Plan: Skilled nursing facility Consults called: None Admission status: Observation  Severity of Illness: The appropriate patient status for this patient is OBSERVATION. Observation status is judged to be reasonable and necessary in order to provide the required intensity of service to ensure the patient's safety. The patient's presenting symptoms, physical exam findings, and initial radiographic and laboratory data in the context of their medical condition is felt to place them at decreased risk for further clinical deterioration. Furthermore, it is anticipated that the patient will be medically stable for discharge from the hospital within 2 midnights of admission. The following factors support the patient status of observation.   " The patient's presenting symptoms include generalized weakness. " The physical exam findings include weak and debilitated. " The initial radiographic and laboratory data are elevated troponin.     Lonia Blood MD [facility] Pager [phone]  If 7PM-7AM, please contact night-coverage [email or web address] Password [facility]  10/05/2019, 8:39 PM

## 2019-10-05 NOTE — ED Notes (Signed)
Pt becoming increasingly confused and restless, pulling at her lines, but able to redirect.

## 2019-10-05 NOTE — Progress Notes (Signed)
CSW called Pt's husband @ [phone], but was unable to connect via telephone due to possible communication issues. CSW called Pts sister [name] @ [phone] to gather more information about living situation. Sister reports that Pt and husband are unable to cook for themselves. Sister states that Pt attempts to cook while sitting on stool and frequently falls off. Sister states that Pt and husband are unable to perform several ADLs.  Sister gave CSW Pts son # and information  [name] and [name] [phone]  CSWs will also call APS to report for safety issues.

## 2019-10-05 NOTE — ED Notes (Signed)
Returns from xray with no changes noted.  Pleasant, quiet and calm.  Asks when she will be able to go home.

## 2019-10-05 NOTE — NC FL2 (Addendum)
[place] MEDICAID FL2 LEVEL OF CARE SCREENING TOOL     IDENTIFICATION  Patient Name: [place] M Santone Birthdate: [birth year] Sex: female Admission Date (Current Location): 10/05/2019  [facility] and [place] Number:  Producer, television/film/video and Address:  [facility], [address], [place], [place] [address]      Provider Number: [id number]  Attending Physician Name and Address:  Rometta Emery, MD  Relative Name and Phone Number:  [name] (sister) PH:[phone]    Current Level of Care: Hospital Recommended Level of Care: Skilled Nursing Facility Prior Approval Number:    Date Approved/Denied:   PASRR Number:    Discharge Plan: SNF    Current Diagnoses: Patient Active Problem List   Diagnosis Date Noted  . Weakness generalized 10/05/2019  . Hypernatremia 07/18/2019  . Hypokalemia 07/18/2019  . Elevated troponin 07/17/2019  . Acute lower UTI 07/17/2019  . Positive D dimer 07/17/2019  . Normocytic anemia 07/17/2019  . Cholelithiasis 01/02/2019  . CKD (chronic kidney disease), stage III 10/21/2017  . Delirium due to another medical condition 12/18/2016  . SBO (small bowel obstruction) ([facility]) 12/18/2016  . 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 Leukocytosis 12/18/2016  . Peripheral artery disease ([facility]) 11/08/2014  . PAD (peripheral artery disease) ([facility]) 10/24/2014  . LBBB (left bundle branch block) 09/27/2014  . Subclavian artery stenosis, left ([facility]) 09/27/2014  . Numbness and tingling in left hand 03/08/2013  . HTN (hypertension) 03/08/2013  . DM (diabetes mellitus) ([facility]) 03/08/2013  . CAD (coronary artery disease) 03/08/2013    Orientation RESPIRATION BLADDER Height & Weight     (Orientation fluctuates and patient could not remember her name at times)  Normal Incontinent Weight:   Height:     BEHAVIORAL SYMPTOMS/MOOD NEUROLOGICAL BOWEL NUTRITION STATUS      Incontinent    AMBULATORY STATUS COMMUNICATION OF NEEDS Skin   Limited  Assist Verbally Normal                       Personal Care Assistance Level of Assistance  Bathing, Dressing Bathing Assistance: Limited assistance   Dressing Assistance: Limited assistance     Functional Limitations Info             SPECIAL CARE FACTORS FREQUENCY                       Contractures Contractures Info: Not present    Additional Factors Info  Allergies   Allergies Info: Statins: Crestor (Rosuvastatin), Lipitor (Atorvastatin), Zocor (Simvastatin)           Current Medications (10/05/2019):  This is the current hospital active medication list Current Facility-Administered Medications  Medication Dose Route Frequency Provider Last Rate Last Admin  . carvedilol (COREG) tablet 6.25 mg  6.25 mg Oral BID WC Evelena Leyden L, PA-C   6.25 mg at 10/05/19 1701   Current Outpatient Medications  Medication Sig Dispense Refill  . acetaminophen (TYLENOL) 500 MG tablet Take 500-1,000 mg by mouth daily.    Marland Kitchen alendronate (FOSAMAX) 70 MG tablet Take 70 mg by mouth every Monday.     . ALPRAZolam (XANAX) 0.5 MG tablet Take 0.5 mg by mouth 3 (three) times daily as needed.    Marland Kitchen amLODipine (NORVASC) 10 MG tablet Take 10 mg by mouth at bedtime.     Marland Kitchen aspirin EC 81 MG tablet Take 81 mg by mouth at bedtime.     . carvedilol (COREG) 6.25 MG tablet Take  12.5 mg by mouth 2 (two) times daily.    . chlorthalidone (HYGROTON) 25 MG tablet Take 25 mg by mouth daily.    . Cholecalciferol (VITAMIN D3) 50 MCG (2000 UT) TABS Take 1 tablet by mouth daily.    Marland Kitchen gabapentin (NEURONTIN) 300 MG capsule Take 600 mg by mouth at bedtime.     . hydrALAZINE (APRESOLINE) 50 MG tablet Take 1 tablet (50 mg total) by mouth 3 (three) times daily. 90 tablet 3  . lovastatin (MEVACOR) 40 MG tablet Take 1 tablet (40 mg total) by mouth every evening. 30 tablet 11  . metFORMIN (GLUCOPHAGE) 500 MG tablet Take 500 mg by mouth at bedtime.    . pantoprazole (PROTONIX) 40 MG tablet Take 40 mg by mouth daily.     Marland Kitchen triamcinolone cream (KENALOG) 0.1 % Apply 1 application topically 2 (two) times daily.       Discharge Medications: Please see discharge summary for a list of discharge medications.  Relevant Imaging Results:  Relevant Lab Results:   Additional Information SSN#: [id number]  Sherie Don, LCSW

## 2019-10-05 NOTE — ED Provider Notes (Signed)
[facility] EMERGENCY DEPARTMENT Provider Note   CSN: [id number] Arrival date & time: 10/05/19  1251     History No chief complaint on file.   Sonya Chavez is a 86 y.o. female with PMH significant for type II DM, HTN, HLD, ACS, and SBO who presents to the ED via EMS after family noticed that she had poor appetite and generalized weakness.  Patient has a history of dementia and is a relatively poor historian, but is in good spirits and in no acute distress on examination.  She states that her appetite remains intact and that she is eating regularly.  She does endorse mild suprapubic discomfort, but no other urinary symptoms.  She is incontinent and wears briefs regularly.  She is denying any headache or dizziness, chest pain or shortness of breath, nausea or vomiting, or change in bowel habits.  Her blood pressure is markedly elevated in the ED she reports that she did not take her medication this morning.  Obtained history from patient's husband, Gerlene Burdock, who states that her dementia has been worsening over the course of the past couple of weeks.  I then spoke with her sister, [name], who is concerned about the current living arrangement and states that the patient has been more aggressive and confused recently putting herself and her elderly husband at risk.  I reviewed patient's medical record and she has known CAD with CABG performed 06/04/2002 as well as subclavian artery stenosis status post stenting 2016.  Echocardiogram performed 03/10/2018 demonstrated LVEF 55% with grade 2 diastolic dysfunction.  HPI     Past Medical History:  Diagnosis Date   Arthritis    "qwhere"   Coronary artery disease    Hypercholesteremia    Hypertension    Myocardial infarction (HCC) 2003   SBO (small bowel obstruction) (HCC) 12/18/2016   Stenosis of subclavian artery (HCC)    Type II diabetes mellitus (HCC)     Patient Active Problem List   Diagnosis Date Noted    Hypernatremia 07/18/2019   Hypokalemia 07/18/2019   Elevated troponin 07/17/2019   Acute lower UTI 07/17/2019   Positive D dimer 07/17/2019   Normocytic anemia 07/17/2019   Cholelithiasis 01/02/2019   CKD (chronic kidney disease), stage III 10/21/2017   Delirium due to another medical condition 12/18/2016   SBO (small bowel obstruction) (HCC) 12/18/2016   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   Leukocytosis 12/18/2016   Peripheral artery disease (HCC) 11/08/2014   PAD (peripheral artery disease) (HCC) 10/24/2014   LBBB (left bundle branch block) 09/27/2014    Class: Chronic   Subclavian artery stenosis, left (HCC) 09/27/2014   Numbness and tingling in left hand 03/08/2013   HTN (hypertension) 03/08/2013   DM (diabetes mellitus) (HCC) 03/08/2013   CAD (coronary artery disease) 03/08/2013    Past Surgical History:  Procedure Laterality Date   ABDOMINAL HYSTERECTOMY  1970   BOWEL RESECTION N/A 12/20/2016   Procedure: SMALL BOWEL RESECTION;  Surgeon: Abigail Miyamoto, MD;  [facility] OR;  Service: General;  Laterality: N/A;   CARDIAC CATHETERIZATION     CATARACT EXTRACTION Bilateral 2000's   CORONARY ARTERY BYPASS GRAFT  2003   in [place], [place]   LAPAROTOMY N/A 12/20/2016   Procedure: EXPLORATORY LAPAROTOMY;  Surgeon: Abigail Miyamoto, MD;  Location: [facility] OR;  Service: General;  Laterality: N/A;   LEFT HEART CATHETERIZATION WITH CORONARY/GRAFT ANGIOGRAM N/A 09/27/2014   Procedure: LEFT HEART CATHETERIZATION WITH Isabel Caprice;  Surgeon: Pamella Pert, MD;  Location: [facility] CATH  LAB;  Service: Cardiovascular;  Laterality: N/A;   UNILATERAL UPPER EXTREMEITY ANGIOGRAM N/A 09/27/2014   Procedure: SUBCLAVIAN ARTERIOGRAM ;  Surgeon: Pamella Pert, MD;  Location: [facility] CATH LAB;  Service: Cardiovascular;  Laterality: N/A;   UNILATERAL UPPER EXTREMEITY ANGIOGRAM N/A 11/09/2014   Procedure: Left subclavian stent ;  Surgeon: Yates Decamp, MD;  Location: [facility] CATH  LAB;  Service: Cardiovascular;  Laterality: N/A;   VISCERAL ANGIOGRAM N/A 10/25/2014   Procedure: Laurence Slate;  Surgeon: Yates Decamp, MD;  Location: [facility] CATH LAB;  Service: Cardiovascular;  Laterality: N/A;     OB History   No obstetric history on file.     Family History  Problem Relation Age of Onset   Diabetes Father    Gallbladder disease Neg Hx     Social History   Tobacco Use   Smoking status: Never Smoker   Smokeless tobacco: Never Used  Substance Use Topics   Alcohol use: No   Drug use: No    Home Medications Prior to Admission medications   Medication Sig Start Date End Date Taking? Authorizing Provider  acetaminophen (TYLENOL) 500 MG tablet Take 500-1,000 mg by mouth daily.   Yes [provider]  alendronate (FOSAMAX) 70 MG tablet Take 70 mg by mouth every Monday.  12/08/14  Yes [provider]  ALPRAZolam Prudy Feeler) 0.5 MG tablet Take 0.5 mg by mouth 3 (three) times daily as needed. 10/01/19  Yes [provider]  amLODipine (NORVASC) 10 MG tablet Take 10 mg by mouth at bedtime.  10/12/14  Yes [provider]  aspirin EC 81 MG tablet Take 81 mg by mouth at bedtime.    Yes [provider]  carvedilol (COREG) 6.25 MG tablet Take 12.5 mg by mouth 2 (two) times daily. 10/14/18  Yes [provider]  chlorthalidone (HYGROTON) 25 MG tablet Take 25 mg by mouth daily. 03/17/19  Yes [provider]  Cholecalciferol (VITAMIN D3) 50 MCG (2000 UT) TABS Take 1 tablet by mouth daily.   Yes [provider]  gabapentin (NEURONTIN) 300 MG capsule Take 600 mg by mouth at bedtime.  04/09/17  Yes [provider]  hydrALAZINE (APRESOLINE) 50 MG tablet Take 1 tablet (50 mg total) by mouth 3 (three) times daily. 08/27/19 11/25/19 Yes Toniann Fail, NP  lovastatin (MEVACOR) 40 MG tablet Take 1 tablet (40 mg total) by mouth every evening. 08/27/19  Yes Toniann Fail, NP  metFORMIN (GLUCOPHAGE) 500 MG  tablet Take 500 mg by mouth at bedtime. 06/01/19  Yes [provider]  pantoprazole (PROTONIX) 40 MG tablet Take 40 mg by mouth daily. 07/04/19  Yes [provider]  triamcinolone cream (KENALOG) 0.1 % Apply 1 application topically 2 (two) times daily. 06/28/19  Yes [provider]    Allergies    Statins, Other, Crestor [rosuvastatin], Lipitor [atorvastatin], and Zocor [simvastatin]  Review of Systems   Review of Systems  Unable to perform ROS: Dementia    Physical Exam Updated Vital Signs BP (!) 178/53    Pulse (!) 59    Temp 97.6 F (36.4 C) (Oral)    Resp (!) 23    SpO2 95%   Physical Exam Vitals and nursing note reviewed. Exam conducted with a chaperone present.  Constitutional:      General: She is not in acute distress.    Appearance: Normal appearance.  HENT:     Head: Normocephalic and atraumatic.  Eyes:     General: No scleral icterus.  Conjunctiva/sclera: Conjunctivae normal.  Cardiovascular:     Rate and Rhythm: Normal rate and regular rhythm.     Pulses: Normal pulses.     Heart sounds: Normal heart sounds.  Pulmonary:     Effort: Pulmonary effort is normal. No respiratory distress.     Breath sounds: Normal breath sounds.  Abdominal:     Comments: Soft, nondistended.  Mild TTP in suprapubic region.  No TTP elsewhere.  No overlying skin changes.  No masses appreciated.  No guarding.  Musculoskeletal:     Cervical back: Normal range of motion. No rigidity.  Skin:    General: Skin is dry.     Capillary Refill: Capillary refill takes less than 2 seconds.  Neurological:     Mental Status: She is alert and oriented to person, place, and time.     GCS: GCS eye subscore is 4. GCS verbal subscore is 5. GCS motor subscore is 6.  Psychiatric:        Mood and Affect: Mood normal.        Behavior: Behavior normal.        Thought Content: Thought content normal.      ED Results / Procedures / Treatments   Labs (all labs ordered are  listed, but only abnormal results are displayed) Labs Reviewed  URINALYSIS, ROUTINE W REFLEX MICROSCOPIC - Abnormal; Notable for the following components:      Result Value   Color, Urine STRAW (*)    Glucose, UA 50 (*)    Protein, ur 100 (*)    Bacteria, UA RARE (*)    All other components within normal limits  CBC WITH DIFFERENTIAL/PLATELET - Abnormal; Notable for the following components:   MCH 25.1 (*)    MCHC 29.2 (*)    RDW 16.0 (*)    All other components within normal limits  COMPREHENSIVE METABOLIC PANEL - Abnormal; Notable for the following components:   Glucose, Bld 144 (*)    Creatinine, Ser 1.48 (*)    GFR calc non Af Amer 30 (*)    GFR calc Af Amer 35 (*)    All other components within normal limits  TROPONIN I (HIGH SENSITIVITY) - Abnormal; Notable for the following components:   Troponin I (High Sensitivity) 20 (*)    All other components within normal limits  URINE CULTURE  TROPONIN I (HIGH SENSITIVITY)    EKG EKG Interpretation  Date/Time:  Tuesday October 05 2019 12:53:07 EST Ventricular Rate:  77 PR Interval:    QRS Duration: 155 QT Interval:  448 QTC Calculation: 508 R Axis:   -42 Text Interpretation: Sinus rhythm Probable left atrial enlargement Left bundle branch block Confirmed by Raeford Razor [phone]) on 10/05/2019 2:18:38 PM   Radiology DG Chest 2 View  Result Date: 10/05/2019 CLINICAL DATA:  Shortness of breath. EXAM: CHEST - 2 VIEW COMPARISON:  July 16, 2019. FINDINGS: Stable cardiomediastinal silhouette. Atherosclerosis of thoracic aorta is noted. Sternotomy wires are noted. No pneumothorax or pleural effusion is noted. No acute pulmonary disease is noted. Bony thorax is unremarkable. IMPRESSION: No active cardiopulmonary disease. Aortic Atherosclerosis (ICD10-I70.0). Electronically Signed   By: Lupita Raider M.D.   On: 10/05/2019 14:49    Procedures Procedures (including critical care time)  Medications Ordered in ED Medications    carvedilol (COREG) tablet 6.25 mg (has no administration in time range)  hydrALAZINE (APRESOLINE) tablet 50 mg (50 mg Oral Given 10/05/19 1422)    ED Course  I have reviewed the triage  vital signs and the nursing notes.  Pertinent labs & imaging results that were available during my care of the patient were reviewed by me and considered in my medical decision making (see chart for details).    MDM Rules/Calculators/A&P                      I reviewed patient's chest x-ray which demonstrated no acute cardiopulmonary findings.  EKG largely unchanged compared to prior tracings.  Patient's lab work obtained is largely unremarkable.  Her CKD is consistent with her baseline.  She is denying any and all symptoms on my examination.  Given her mildly elevated troponin of 20, will trend.  After speaking with patient's sister, Meryl Crutch, and her husband, Delfino Lovett, it sounds as though discharging patient home would put both the patient and her husband at risk.  They are requesting case management consult given her worsening Alzheimer's disease which I feel is appropriate.   Those concerns are what prompted them to call EMS.  Her sister does not live in town and her husband is unable to provide adequate care for her given her worsening cognition.  I have added Ethel's information to her demographics.   At shift change care was transferred to Wyn Quaker, PA-C who will follow pending studies, re-evaluate, speak with case management, and determine disposition.     Final Clinical Impression(s) / ED Diagnoses Final diagnoses:  Weakness  Alzheimer's dementia with behavioral disturbance, unspecified timing of dementia onset [facility])    Rx / DC Orders ED Discharge Orders    None       Corena Herter, PA-C 10/05/19 1553    Virgel Manifold, MD 10/10/19 1058

## 2019-10-05 NOTE — ED Notes (Signed)
Pt appears agitated and is pulling at gown and lines. PA notified.

## 2019-10-06 ENCOUNTER — Ambulatory Visit (HOSPITAL_COMMUNITY): Payer: Medicare Other

## 2019-10-06 DIAGNOSIS — R1311 Dysphagia, oral phase: Secondary | ICD-10-CM | POA: Diagnosis not present

## 2019-10-06 DIAGNOSIS — D649 Anemia, unspecified: Secondary | ICD-10-CM | POA: Diagnosis not present

## 2019-10-06 DIAGNOSIS — I959 Hypotension, unspecified: Secondary | ICD-10-CM | POA: Diagnosis not present

## 2019-10-06 DIAGNOSIS — F0281 Dementia in other diseases classified elsewhere with behavioral disturbance: Secondary | ICD-10-CM | POA: Diagnosis not present

## 2019-10-06 DIAGNOSIS — R2681 Unsteadiness on feet: Secondary | ICD-10-CM | POA: Diagnosis not present

## 2019-10-06 DIAGNOSIS — R079 Chest pain, unspecified: Secondary | ICD-10-CM

## 2019-10-06 DIAGNOSIS — I252 Old myocardial infarction: Secondary | ICD-10-CM | POA: Diagnosis not present

## 2019-10-06 DIAGNOSIS — Z20822 Contact with and (suspected) exposure to covid-19: Secondary | ICD-10-CM | POA: Diagnosis present

## 2019-10-06 DIAGNOSIS — N179 Acute kidney failure, unspecified: Secondary | ICD-10-CM | POA: Diagnosis present

## 2019-10-06 DIAGNOSIS — Z90710 Acquired absence of both cervix and uterus: Secondary | ICD-10-CM | POA: Diagnosis not present

## 2019-10-06 DIAGNOSIS — I10 Essential (primary) hypertension: Secondary | ICD-10-CM | POA: Diagnosis not present

## 2019-10-06 DIAGNOSIS — R6251 Failure to thrive (child): Secondary | ICD-10-CM | POA: Diagnosis present

## 2019-10-06 DIAGNOSIS — E1122 Type 2 diabetes mellitus with diabetic chronic kidney disease: Secondary | ICD-10-CM | POA: Diagnosis present

## 2019-10-06 DIAGNOSIS — G308 Other Alzheimer's disease: Secondary | ICD-10-CM | POA: Diagnosis not present

## 2019-10-06 DIAGNOSIS — F05 Delirium due to known physiological condition: Secondary | ICD-10-CM | POA: Diagnosis present

## 2019-10-06 DIAGNOSIS — M6389 Disorders of muscle in diseases classified elsewhere, multiple sites: Secondary | ICD-10-CM | POA: Diagnosis not present

## 2019-10-06 DIAGNOSIS — Z6823 Body mass index (BMI) 23.0-23.9, adult: Secondary | ICD-10-CM | POA: Diagnosis not present

## 2019-10-06 DIAGNOSIS — K219 Gastro-esophageal reflux disease without esophagitis: Secondary | ICD-10-CM | POA: Diagnosis not present

## 2019-10-06 DIAGNOSIS — I130 Hypertensive heart and chronic kidney disease with heart failure and stage 1 through stage 4 chronic kidney disease, or unspecified chronic kidney disease: Secondary | ICD-10-CM | POA: Diagnosis present

## 2019-10-06 DIAGNOSIS — G309 Alzheimer's disease, unspecified: Secondary | ICD-10-CM | POA: Diagnosis present

## 2019-10-06 DIAGNOSIS — E7800 Pure hypercholesterolemia, unspecified: Secondary | ICD-10-CM | POA: Diagnosis present

## 2019-10-06 DIAGNOSIS — I25119 Atherosclerotic heart disease of native coronary artery with unspecified angina pectoris: Secondary | ICD-10-CM | POA: Diagnosis not present

## 2019-10-06 DIAGNOSIS — I214 Non-ST elevation (NSTEMI) myocardial infarction: Secondary | ICD-10-CM | POA: Diagnosis present

## 2019-10-06 DIAGNOSIS — Z951 Presence of aortocoronary bypass graft: Secondary | ICD-10-CM | POA: Diagnosis not present

## 2019-10-06 DIAGNOSIS — Z66 Do not resuscitate: Secondary | ICD-10-CM | POA: Diagnosis present

## 2019-10-06 DIAGNOSIS — Z9181 History of falling: Secondary | ICD-10-CM | POA: Diagnosis not present

## 2019-10-06 DIAGNOSIS — M2550 Pain in unspecified joint: Secondary | ICD-10-CM | POA: Diagnosis not present

## 2019-10-06 DIAGNOSIS — E785 Hyperlipidemia, unspecified: Secondary | ICD-10-CM | POA: Diagnosis not present

## 2019-10-06 DIAGNOSIS — M6259 Muscle wasting and atrophy, not elsewhere classified, multiple sites: Secondary | ICD-10-CM | POA: Diagnosis not present

## 2019-10-06 DIAGNOSIS — R627 Adult failure to thrive: Secondary | ICD-10-CM | POA: Diagnosis present

## 2019-10-06 DIAGNOSIS — N1831 Chronic kidney disease, stage 3a: Secondary | ICD-10-CM | POA: Diagnosis not present

## 2019-10-06 DIAGNOSIS — I2510 Atherosclerotic heart disease of native coronary artery without angina pectoris: Secondary | ICD-10-CM | POA: Diagnosis present

## 2019-10-06 DIAGNOSIS — R278 Other lack of coordination: Secondary | ICD-10-CM | POA: Diagnosis not present

## 2019-10-06 DIAGNOSIS — M1990 Unspecified osteoarthritis, unspecified site: Secondary | ICD-10-CM | POA: Diagnosis present

## 2019-10-06 DIAGNOSIS — I5031 Acute diastolic (congestive) heart failure: Secondary | ICD-10-CM | POA: Diagnosis present

## 2019-10-06 DIAGNOSIS — N1832 Chronic kidney disease, stage 3b: Secondary | ICD-10-CM | POA: Diagnosis not present

## 2019-10-06 DIAGNOSIS — Z7401 Bed confinement status: Secondary | ICD-10-CM | POA: Diagnosis not present

## 2019-10-06 DIAGNOSIS — E876 Hypokalemia: Secondary | ICD-10-CM | POA: Diagnosis present

## 2019-10-06 DIAGNOSIS — F418 Other specified anxiety disorders: Secondary | ICD-10-CM | POA: Diagnosis not present

## 2019-10-06 DIAGNOSIS — E1165 Type 2 diabetes mellitus with hyperglycemia: Secondary | ICD-10-CM | POA: Diagnosis not present

## 2019-10-06 DIAGNOSIS — R778 Other specified abnormalities of plasma proteins: Secondary | ICD-10-CM | POA: Diagnosis not present

## 2019-10-06 DIAGNOSIS — E119 Type 2 diabetes mellitus without complications: Secondary | ICD-10-CM | POA: Diagnosis not present

## 2019-10-06 DIAGNOSIS — I447 Left bundle-branch block, unspecified: Secondary | ICD-10-CM | POA: Diagnosis present

## 2019-10-06 DIAGNOSIS — R41841 Cognitive communication deficit: Secondary | ICD-10-CM | POA: Diagnosis not present

## 2019-10-06 DIAGNOSIS — E7849 Other hyperlipidemia: Secondary | ICD-10-CM | POA: Diagnosis not present

## 2019-10-06 DIAGNOSIS — E46 Unspecified protein-calorie malnutrition: Secondary | ICD-10-CM | POA: Diagnosis not present

## 2019-10-06 DIAGNOSIS — Z515 Encounter for palliative care: Secondary | ICD-10-CM | POA: Diagnosis not present

## 2019-10-06 DIAGNOSIS — R531 Weakness: Secondary | ICD-10-CM | POA: Diagnosis not present

## 2019-10-06 DIAGNOSIS — E8809 Other disorders of plasma-protein metabolism, not elsewhere classified: Secondary | ICD-10-CM | POA: Diagnosis not present

## 2019-10-06 DIAGNOSIS — M6281 Muscle weakness (generalized): Secondary | ICD-10-CM | POA: Diagnosis not present

## 2019-10-06 DIAGNOSIS — E118 Type 2 diabetes mellitus with unspecified complications: Secondary | ICD-10-CM | POA: Diagnosis not present

## 2019-10-06 LAB — CBC
HCT: 39.7 % (ref 36.0–46.0)
Hemoglobin: 12.1 g/dL (ref 12.0–15.0)
MCH: 25.2 pg — ABNORMAL LOW (ref 26.0–34.0)
MCHC: 30.5 g/dL (ref 30.0–36.0)
MCV: 82.5 fL (ref 80.0–100.0)
Platelets: 172 10*3/uL (ref 150–400)
RBC: 4.81 MIL/uL (ref 3.87–5.11)
RDW: 16 % — ABNORMAL HIGH (ref 11.5–15.5)
WBC: 6.7 10*3/uL (ref 4.0–10.5)
nRBC: 0 % (ref 0.0–0.2)

## 2019-10-06 LAB — ECHOCARDIOGRAM COMPLETE: Weight: 1920 oz

## 2019-10-06 LAB — BASIC METABOLIC PANEL
Anion gap: 13 (ref 5–15)
BUN: 18 mg/dL (ref 8–23)
CO2: 20 mmol/L — ABNORMAL LOW (ref 22–32)
Calcium: 8.6 mg/dL — ABNORMAL LOW (ref 8.9–10.3)
Chloride: 109 mmol/L (ref 98–111)
Creatinine, Ser: 1.37 mg/dL — ABNORMAL HIGH (ref 0.44–1.00)
GFR calc Af Amer: 39 mL/min — ABNORMAL LOW (ref >60)
GFR calc non Af Amer: 33 mL/min — ABNORMAL LOW (ref >60)
Glucose, Bld: 105 mg/dL — ABNORMAL HIGH (ref 70–99)
Potassium: 4.1 mmol/L (ref 3.5–5.1)
Sodium: 142 mmol/L (ref 135–145)

## 2019-10-06 LAB — URINE CULTURE

## 2019-10-06 LAB — GLUCOSE, CAPILLARY
Glucose-Capillary: 91 mg/dL (ref 70–99)
Glucose-Capillary: 102 mg/dL — ABNORMAL HIGH (ref 70–99)
Glucose-Capillary: 107 mg/dL — ABNORMAL HIGH (ref 70–99)
Glucose-Capillary: 169 mg/dL — ABNORMAL HIGH (ref 70–99)

## 2019-10-06 LAB — CREATININE, SERUM
Creatinine, Ser: 1.41 mg/dL — ABNORMAL HIGH (ref 0.44–1.00)
GFR calc Af Amer: 37 mL/min — ABNORMAL LOW (ref >60)
GFR calc non Af Amer: 32 mL/min — ABNORMAL LOW (ref >60)

## 2019-10-06 LAB — SARS CORONAVIRUS 2 (TAT 6-24 HRS): SARS Coronavirus 2: NEGATIVE

## 2019-10-06 LAB — TROPONIN I (HIGH SENSITIVITY): Troponin I (High Sensitivity): 71 ng/L — ABNORMAL HIGH (ref <18)

## 2019-10-06 MED ORDER — HYDRALAZINE HCL 20 MG/ML IJ SOLN: 10.0000 mg | Freq: Four times a day (QID) | INTRAMUSCULAR | Status: DC | PRN

## 2019-10-06 MED ORDER — METFORMIN HCL 500 MG PO TABS: 500.0000 mg | ORAL_TABLET | Freq: Every day | ORAL | Status: DC

## 2019-10-06 MED ORDER — HYDRALAZINE HCL 50 MG PO TABS
100.0000 mg | ORAL_TABLET | Freq: Three times a day (TID) | ORAL | Status: DC
  Administered 2019-10-06 – 2019-10-11 (×12): 100 mg via ORAL
  Filled 2019-10-06 (×16): qty 2

## 2019-10-06 MED ORDER — ENOXAPARIN SODIUM 30 MG/0.3ML ~~LOC~~ SOLN
30.0000 mg | SUBCUTANEOUS | Status: DC
  Administered 2019-10-06 – 2019-10-11 (×6): 30 mg via SUBCUTANEOUS
  Filled 2019-10-06 (×5): qty 0.3

## 2019-10-06 NOTE — Progress Notes (Signed)
Physical Therapy Treatment Patient Details Name: Sonya Chavez MRN: [id number] DOB: 05/05/1926 Today's Date: 10/06/2019    History of Present Illness Pt is 86 yo female with PMH for dementia, CAD, CABG, DMII, HTN, HLD, ACS, and SBO who presented to ED with poor appetite and generalized weakness.    PT Comments    Pt did much better with tx today than previous day, better able to tolerate mobility and also needing decreased assistance. Cognition still highly limiting, pt noted to be speaking non-sencically throughout session. Pt able to sit edge of bed with feet supported with no loss of balance, able to ambulate approx 139ft with RW and mod a, needed assist with walker navigation and also propulsion. Pt did well with this. Pt was on room air and sats remained in 90s throughout session. Will continue to work with pt working on increasing independence and also activity tolerance.      Follow Up Recommendations  SNF;Other (comment)     Equipment Recommendations  None recommended by PT    Recommendations for Other Services       Precautions / Restrictions Precautions Precautions: Fall Precaution Comments: cognition Restrictions Weight Bearing Restrictions: No    Mobility  Bed Mobility Overal bed mobility: Needs Assistance Bed Mobility: Supine to Sit;Sit to Supine     Supine to sit: Mod assist Sit to supine: Min assist;Mod assist   General bed mobility comments: increased time and cues  Transfers Overall transfer level: Needs assistance Equipment used: Rolling walker (2 wheeled) Transfers: Sit to/from Stand Sit to Stand: Min assist         General transfer comment: cues and assist for safety  Ambulation/Gait Ambulation/Gait assistance: Mod assist Gait Distance (Feet): 100 Feet Assistive device: Rolling walker (2 wheeled) Gait Pattern/deviations: Decreased stride length;Staggering left;Staggering right;Shuffle;Narrow base of support Gait velocity:  decreased   General Gait Details: needs assist with walker navigation and also propagation   Stairs             Wheelchair Mobility    Modified Rankin (Stroke Patients Only)       Balance Overall balance assessment: Needs assistance Sitting-balance support: No upper extremity supported;Feet unsupported Sitting balance-Leahy Scale: Fair Sitting balance - Comments: sat edge of bed unsupported with no LObs   Standing balance support: During functional activity;Bilateral upper extremity supported Standing balance-Leahy Scale: Poor                              Cognition Arousal/Alertness: Lethargic Behavior During Therapy: Restless;Flat affect Overall Cognitive Status: No family/caregiver present to determine baseline cognitive functioning                                 General Comments: Pt restless, pulling at tele cords and nelcor. Pt required mod to max repeat of instructions due to poor recall. Pt required mod to max cues to initiate tasks. Pt only oriented to self.       Exercises      General Comments General comments (skin integrity, edema, etc.): RN cleared therapy to work with pt. No signs/symptoms of distress. HR and SpO2 remained stable on room air.       Pertinent Vitals/Pain Pain Assessment: No/denies pain    Home Living Family/patient expects to be discharged to:: Unsure Living Arrangements: Spouse/significant other Available Help at Discharge: Family;Available 24 hours/day Type of Home: House Home Access:  Stairs to enter Entrance Stairs-Rails: Right;Left Home Layout: One level Home Equipment: Walker - 2 wheels Additional Comments: Pt unable to provide PLOF. Attempted to call family with no answer. PLOF obtained from 2020 admission.     Prior Function Level of Independence: Independent      Comments: Pt unable to provide.  From note 9 months ago she was independent.  Per ED notes and family: pt with increasing weakness  and family having difficulty managing her at home due to dementia.  Pt states does not use RW.   PT Goals (current goals can now be found in the care plan section) Acute Rehab PT Goals Patient Stated Goal: unable to state PT Goal Formulation: Patient unable to participate in goal setting Time For Goal Achievement: 10/19/19 Potential to Achieve Goals: Good Progress towards PT goals: Progressing toward goals    Frequency    Min 3X/week      PT Plan Current plan remains appropriate    Co-evaluation PT/OT/SLP Co-Evaluation/Treatment: Yes Reason for Co-Treatment: For patient/therapist safety;To address functional/ADL transfers PT goals addressed during session: Mobility/safety with mobility;Balance;Proper use of DME;Strengthening/ROM OT goals addressed during session: ADL's and self-care;Strengthening/ROM      AM-PAC PT "6 Clicks" Mobility   Outcome Measure  Help needed turning from your back to your side while in a flat bed without using bedrails?: A Lot Help needed moving from lying on your back to sitting on the side of a flat bed without using bedrails?: A Little Help needed moving to and from a bed to a chair (including a wheelchair)?: A Little Help needed standing up from a chair using your arms (e.g., wheelchair or bedside chair)?: A Little Help needed to walk in hospital room?: A Lot Help needed climbing 3-5 steps with a railing? : A Lot 6 Click Score: 15    End of Session Equipment Utilized During Treatment: Gait belt Activity Tolerance: Patient limited by fatigue;Patient limited by lethargy Patient left: in bed;with call bell/phone within reach;with bed alarm set Nurse Communication: Mobility status PT Visit Diagnosis: Muscle weakness (generalized) (M62.81);Unsteadiness on feet (R26.81)     Time: 6213-0865 PT Time Calculation (min) (ACUTE ONLY): 32 min  Charges:  $Gait Training: 8-22 mins                     Drema Pry, PT    Freddi Starr 10/06/2019, 1:23  PM

## 2019-10-06 NOTE — Progress Notes (Signed)
  Echocardiogram 2D Echocardiogram has been performed.  Stark Bray Swaim 10/06/2019, 11:35 AM

## 2019-10-06 NOTE — ED Notes (Signed)
Pt pulling at lines, removing herself from cardiac monitor, and trying to get out of bed. RN has attempted multiple times to reorient the pt without success. Pt pulled up in bed, covered back up, and placed back on cardiac monitor. Paging MD.

## 2019-10-06 NOTE — Progress Notes (Signed)
Occupational Therapy Evaluation Patient Details Name: [place] M Khachatryan MRN: [id number] DOB: 27-Jan-1926 Today's Date: 10/06/2019    History of Present Illness Pt is 86 yo female with PMH for dementia, CAD, CABG, DMII, HTN, HLD, ACS, and SBO who presented to ED with poor appetite and generalized weakness.   Clinical Impression   RN cleared therapy to work with pt. Cotx with PT to maximize pt safety and decrease strain on pt. Unable to obtain PLOF from pt due to confusion. Attempted to call family with no success. PLOF obtained from 2020 admission which reported that pt was independent. Per ED notes: pt with increasing weakness and family having difficulty managing her at home due to dementia. Pt tolerated sitting EOB ~10 min with supervision, noting 0 instances of LOB. Pt engaged in simple grooming/hygiene tasks while seated requiring min assist. Pt tolerated standing and ambulating ~3 min with RW and mod assist. Pt easily distracted by environment and personal conversation requiring mod cues to redirect back to tasks. Pt required increased time to complete all tasks. No signs/symptoms of distress with VSS on room air. Pt demonstrated decreased cognition, strength, endurance, balance, standing tolerance, activity tolerance, and safety awareness impacting ability to complete self-care and functional transfer tasks. Recommend skilled OT services to address above deficits in order to promote function and prevent further decline. Recommend SNF placement for additional rehab prior to discharge home.     Follow Up Recommendations  SNF;Supervision/Assistance - 24 hour    Equipment Recommendations  Other (comment)(TBD at next venue of care)    Recommendations for Other Services       Precautions / Restrictions Precautions Precautions: Fall Restrictions Weight Bearing Restrictions: No      Mobility Bed Mobility Overal bed mobility: Needs Assistance Bed Mobility: Supine to Sit;Sit to Supine      Supine to sit: Mod assist Sit to supine: Min assist;Mod assist   General bed mobility comments: increased time and cues  Transfers Overall transfer level: Needs assistance Equipment used: Rolling walker (2 wheeled) Transfers: Sit to/from Stand Sit to Stand: Min assist         General transfer comment: cues and assist for safety    Balance Overall balance assessment: Needs assistance Sitting-balance support: No upper extremity supported;Feet unsupported Sitting balance-Leahy Scale: Fair       Standing balance-Leahy Scale: Poor                             ADL either performed or assessed with clinical judgement   ADL Overall ADL's : Needs assistance/impaired Eating/Feeding: Minimal assistance;Sitting   Grooming: Minimal assistance;Sitting   Upper Body Bathing: Minimal assistance;Moderate assistance;Sitting   Lower Body Bathing: Maximal assistance;Sit to/from stand;Sitting/lateral leans   Upper Body Dressing : Minimal assistance;Moderate assistance;Sitting   Lower Body Dressing: Maximal assistance;Sit to/from stand;Sitting/lateral leans   Toilet Transfer: Minimal assistance;BSC   Toileting- Clothing Manipulation and Hygiene: Maximal assistance;Sit to/from stand       Functional mobility during ADLs: Minimal assistance;Rolling walker General ADL Comments: Pt tolerated sitting EOB ~10 min while engaging in therapy tasks. Noted 0 instances of LOB.      Vision         Perception     Praxis      Pertinent Vitals/Pain Pain Assessment: No/denies pain     Hand Dominance Right   Extremity/Trunk Assessment Upper Extremity Assessment Upper Extremity Assessment: Generalized weakness(BUE ROM WFL)   Lower Extremity Assessment Lower Extremity Assessment:  Defer to PT evaluation       Communication Communication Communication: No difficulties   Cognition Arousal/Alertness: Lethargic Behavior During Therapy: Restless;Flat affect Overall  Cognitive Status: No family/caregiver present to determine baseline cognitive functioning                                 General Comments: Pt restless, pulling at tele cords and nelcor. Pt required mod to max repeat of instructions due to poor recall. Pt required mod to max cues to initiate tasks. Pt only oriented to self.    General Comments  RN cleared therapy to work with pt. No signs/symptoms of distress. HR and SpO2 remained stable on room air.     Exercises     Shoulder Instructions      Home Living Family/patient expects to be discharged to:: Unsure Living Arrangements: Spouse/significant other Available Help at Discharge: Family;Available 24 hours/day Type of Home: House Home Access: Stairs to enter [employer] of Steps: 3 Entrance Stairs-Rails: Right;Left Home Layout: One level               Home Equipment: Walker - 2 wheels   Additional Comments: Pt unable to provide PLOF. Attempted to call family with no answer. PLOF obtained from 2020 admission.       Prior Functioning/Environment Level of Independence: Independent        Comments: Pt unable to provide.  From note 9 months ago she was independent.  Per ED notes and family: pt with increasing weakness and family having difficulty managing her at home due to dementia.  Pt states does not use RW.        OT Problem List: Decreased strength;Decreased activity tolerance;Impaired balance (sitting and/or standing);Decreased cognition;Decreased safety awareness      OT Treatment/Interventions: Self-care/ADL training;Therapeutic exercise;Neuromuscular education;Energy conservation;DME and/or AE instruction;Therapeutic activities;Patient/family education;Balance training    OT Goals(Current goals can be found in the care plan section) Acute Rehab OT Goals Patient Stated Goal: unable to state Time For Goal Achievement: 10/20/19 Potential to Achieve Goals: Fair ADL Goals Pt Will Perform  Grooming: with set-up;sitting Pt Will Perform Upper Body Bathing: with set-up;sitting Pt Will Transfer to Toilet: with min guard assist;ambulating;regular height toilet Pt Will Perform Toileting - Clothing Manipulation and hygiene: with min assist;sit to/from stand Additional ADL Goal #1: Pt to complete bed mobility with supervision in preparation for ADLs. Additional ADL Goal #2: Pt to tolerate standing up to 5 min with min guard, in preparation for ADLs and transfers.  OT Frequency: Min 2X/week   Barriers to D/C:            Co-evaluation PT/OT/SLP Co-Evaluation/Treatment: Yes Reason for Co-Treatment: For patient/therapist safety;To address functional/ADL transfers   OT goals addressed during session: ADL's and self-care;Strengthening/ROM      AM-PAC OT "6 Clicks" Daily Activity     Outcome Measure Help from another person eating meals?: A Little Help from another person taking care of personal grooming?: A Little Help from another person toileting, which includes using toliet, bedpan, or urinal?: A Lot Help from another person bathing (including washing, rinsing, drying)?: A Lot Help from another person to put on and taking off regular upper body clothing?: A Lot Help from another person to put on and taking off regular lower body clothing?: A Lot 6 Click Score: 14   End of Session Equipment Utilized During Treatment: Gait belt;Rolling walker Nurse Communication: Mobility status  Activity Tolerance:  Patient limited by fatigue Patient left: in bed;with call bell/phone within reach;with bed alarm set  OT Visit Diagnosis: Unsteadiness on feet (R26.81);Muscle weakness (generalized) (M62.81);Other abnormalities of gait and mobility (R26.89)                Time: 7366-8159 OT Time Calculation (min): 32 min Charges:  OT General Charges $OT Visit: 1 Visit OT Evaluation $OT Eval Low Complexity: 1 Low  Peterson Ao OTR/L [phone]  Peterson Ao 10/06/2019, 1:06 PM

## 2019-10-06 NOTE — NC FL2 (Signed)
[place] MEDICAID FL2 LEVEL OF CARE SCREENING TOOL     IDENTIFICATION  Patient Name: Sonya Chavez Birthdate: [birth year] Sex: female Admission Date (Current Location): 10/05/2019  [facility] and [place] Number:  Herbalist and Address:  [facility] [place], [place] [address] [place], [place] [address]      Provider Number: [id number]  Attending Physician Name and Address:  Darliss Cheney, MD  Relative Name and Phone Number:  Meryl Crutch (sister) [phone]    Current Level of Care: Hospital Recommended Level of Care: [place] Prior Approval Number:    Date Approved/Denied:   PASRR Number: pending  Discharge Plan: SNF    Current Diagnoses: Patient Active Problem List   Diagnosis Date Noted  . Weakness generalized 10/05/2019  . Hypernatremia 07/18/2019  . Hypokalemia 07/18/2019  . Elevated troponin 07/17/2019  . Acute lower UTI 07/17/2019  . Positive D dimer 07/17/2019  . Normocytic anemia 07/17/2019  . Cholelithiasis 01/02/2019  . CKD (chronic kidney disease), stage III 10/21/2017  . Delirium due to another medical condition 12/18/2016  . SBO (small bowel obstruction) ([place]) 12/18/2016  . 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 Leukocytosis 12/18/2016  . Peripheral artery disease ([place]) 11/08/2014  . PAD (peripheral artery disease) ([place]) 10/24/2014  . LBBB (left bundle branch block) 09/27/2014  . Subclavian artery stenosis, left ([place]) 09/27/2014  . Numbness and tingling in left hand 03/08/2013  . HTN (hypertension) 03/08/2013  . DM (diabetes mellitus) ([place]) 03/08/2013  . CAD (coronary artery disease) 03/08/2013    Orientation RESPIRATION BLADDER Height & Weight     Self(Orientation fluctuates and patient could not remember her name at times)  Normal Incontinent, External catheter Weight: 120 lb (54.4 kg) Height:     BEHAVIORAL SYMPTOMS/MOOD NEUROLOGICAL BOWEL NUTRITION STATUS      Incontinent Diet(see discharge  summary)  AMBULATORY STATUS COMMUNICATION OF NEEDS Skin   Extensive Assist Verbally Normal                       Personal Care Assistance Level of Assistance  Bathing, Dressing, Feeding Bathing Assistance: Maximum assistance Feeding assistance: Independent Dressing Assistance: Maximum assistance     Functional Limitations Info  Sight, Hearing, Speech Sight Info: Adequate Hearing Info: Impaired Speech Info: Adequate    SPECIAL CARE FACTORS FREQUENCY  OT (By licensed OT), PT (By licensed PT)     PT Frequency: 5x week OT Frequency: 5x week            Contractures Contractures Info: Not present    Additional Factors Info  Allergies, Code Status Code Status Info: Full Code Allergies Info: Statins: Crestor (Rosuvastatin), Lipitor (Atorvastatin), Zocor (Simvastatin)           Current Medications (10/06/2019):  This is the current hospital active medication list Current Facility-Administered Medications  Medication Dose Route Frequency Provider Last Rate Last Admin  . 0.9 %  sodium chloride infusion   Intravenous Continuous Elwyn Reach, MD 75 mL/hr at 10/06/19 0303 New Bag at 10/06/19 0303  . acetaminophen (TYLENOL) tablet 650 mg  650 mg Oral Q4H PRN Gala Romney L, MD      . ALPRAZolam Duanne Moron) tablet 0.5 mg  0.5 mg Oral TID PRN Gala Romney L, MD      . amLODipine (NORVASC) tablet 10 mg  10 mg Oral QHS Gala Romney L, MD      . aspirin EC tablet 81 mg  81 mg Oral QHS Elwyn Reach, MD      .  carvedilol (COREG) tablet 12.5 mg  12.5 mg Oral BID Earlie Lou L, MD      . chlorthalidone (HYGROTON) tablet 25 mg  25 mg Oral Daily Mikeal Hawthorne, Mohammad L, MD      . cholecalciferol (VITAMIN D3) tablet 2,000 Units  2,000 Units Oral Daily Garba, Mohammad L, MD      . enoxaparin (LOVENOX) injection 30 mg  30 mg Subcutaneous QHS Garba, Mohammad L, MD      . gabapentin (NEURONTIN) capsule 600 mg  600 mg Oral QHS Garba, Mohammad L, MD      . hydrALAZINE (APRESOLINE)  tablet 50 mg  50 mg Oral TID Earlie Lou L, MD      . metFORMIN (GLUCOPHAGE) tablet 500 mg  500 mg Oral Q supper Stevphen Rochester, RPH      . ondansetron (ZOFRAN) injection 4 mg  4 mg Intravenous Q6H PRN Mikeal Hawthorne, Mohammad L, MD      . pantoprazole (PROTONIX) EC tablet 40 mg  40 mg Oral Daily Garba, Mohammad L, MD      . pravastatin (PRAVACHOL) tablet 40 mg  40 mg Oral q1800 Garba, Mohammad L, MD      . triamcinolone cream (KENALOG) 0.1 % 1 application  1 application Topical BID Rometta Emery, MD         Discharge Medications: Please see discharge summary for a list of discharge medications.  Relevant Imaging Results:  Relevant Lab Results:   Additional Information SSN#: [id number]  Doy Hutching, LCSW

## 2019-10-06 NOTE — Plan of Care (Signed)
Pt drowsy but arousable oriented to self confused calm denied pain on room air 97% sats no acute distress noted on tle monitored by staff. Bed bound purwick in place for urination inc of bowel. Poor appetite, bed in low position mats in place call bell within reach will continue to monitor

## 2019-10-06 NOTE — ED Notes (Signed)
PAGED TRIAD TO RN ELIZABETH--Maya Scholer 

## 2019-10-06 NOTE — Progress Notes (Signed)
PROGRESS NOTE    [place] M Lajara  HWE:[id number] DOB: November 22, 1925 DOA: 10/05/2019 PCP: Alysia Penna, MD   Brief Narrative:  HPI: Sonya Chavez is a 86 y.o. female with medical history significant of coronary artery disease, hypertension, hyperlipidemia, diabetes, osteoarthritis who was brought in by family due to failure to thrive at home.  Patient was generally weak and debilitated.  She is unable to give adequate history.  History obtained over the phone with family member.  She is however initially bedbound in the last few weeks.  Her initial evaluation only shows escalating troponin which is positive.  Not sure if this is new or old.  There is concern for possible cardiac involvement.  Patient's family member indicated history of partial DNR.  He does not want intubation or CPR but patient is therefore being admitted with possible NSTEMI but will have observation.  Ultimate goal is for placement in a skilled facility as she is failing to thrive at home..  ED Course: Temperature 97.6 blood pressure 200/75 pulse 80 respirate 23 oxygen sat 95% on room air.  Chemistry relatively normal except for glucose 144.  Initial troponin was 20 and then second was 41.  EKG showed no acute findings.  Patient being admitted for observation to rule out MI  Assessment & Plan:   Principal Problem:   Weakness generalized Active Problems:   HTN (hypertension)   DM (diabetes mellitus) (HCC)   CAD (coronary artery disease)   CKD (chronic kidney disease), stage III   Elevated troponin   Generalized weakness/failure to thrive/memory issues/?  Dementia: Patient is completely alert however she is oriented to herself only.  Could not tell me the year, current place in the month.  Talked to patient's son Sonya Chavez and her sister Sonya Chavez and they confirmed that patient has been having memory issues lately and this is her baseline.  She was seen by PT OT and they recommend SNF.  TOC on board.  Elevated  troponin: No chest pain or any other symptoms of ACS.  Troponin 20> 41>71.  Echo ordered and results pending.  Monitor.  CKD stage IIIb: H&P has stated acute kidney injury however if one would do thorough chart review, it seems like patient has chronic kidney disease stage III and her creatinine as at her baseline.  Watch closely.  Essential hypertension: Blood pressure significantly elevated despite of resuming her home medications which are chlorthalidone, amlodipine, carvedilol and hydralazine.  Due to her CKD, we are unable to prescribe ARB or ACEI.  Will increase hydralazine dose to 200 mg 3 times daily.  Add as needed hydralazine.  Watch closely.  Hyperlipidemia: Continue home statin  DVT prophylaxis: Lovenox   Code Status: DNR, present on admission.  Interestingly, H&P had clearly stated that patient's family had wanted DNR but patient is full code here.  I called patient's Sister Sonya Chavez and her son Sonya Chavez and they confirmed that patient would like to be DNR.  We will switch her to DNR. Family Communication: None present at bedside.  Family discussion as above Patient is from: Home Disposition Plan: SNF Barriers to discharge: TOC working on placement, she is medically stable and cleared for discharge.   Estimated body mass index is 21.95 kg/m as calculated from the following:   Height as of 08/27/19: 5\' 2"  (1.575 m).   Weight as of this encounter: 54.4 kg.      Nutritional status:               Consultants:  None  Procedures:   None  Antimicrobials:   None   Subjective: Patient seen and examined.  She was alert and oriented to person only.  No other complaint.  Objective: Vitals:   10/06/19 0800 10/06/19 0900 10/06/19 1000 10/06/19 1300  BP:    (!) 181/64  Pulse: (!) 58 60 79 66  Resp: 17 15 17 19   Temp:    (!) 97.5 F (36.4 C)  TempSrc:    Oral  SpO2: 97% 94% 100%   Weight:        Intake/Output Summary (Last 24 hours) at 10/06/2019 1340 Last data  filed at 10/05/2019 1906 Gross per 24 hour  Intake -  Output 900 ml  Net -900 ml   Filed Weights   10/06/19 0436  Weight: 54.4 kg    Examination:  General exam: Appears calm and comfortable  Respiratory system: Clear to auscultation. Respiratory effort normal. Cardiovascular system: S1 & S2 heard, RRR. No JVD, murmurs, rubs, gallops or clicks. No pedal edema. Gastrointestinal system: Abdomen is nondistended, soft and nontender. No organomegaly or masses felt. Normal bowel sounds heard. Central nervous system: Alert and oriented x1. No focal neurological deficits. Extremities: Symmetric 5 x 5 power. Skin: No rashes, lesions or ulcers Psychiatry: Judgement and insight appear poor. Mood & affect appropriate.    Data Reviewed: I have personally reviewed following labs and imaging studies  CBC: Recent Labs  Lab 10/05/19 1400 10/05/19 2313 10/06/19 1250  WBC 5.4 6.5 6.7  NEUTROABS 3.3  --   --   HGB 12.4 12.3 12.1  HCT 42.4 40.4 39.7  MCV 85.7 84.2 82.5  PLT 170 171 172   Basic Metabolic Panel: Recent Labs  Lab 10/05/19 1400 10/05/19 2313 10/06/19 1250  NA 143  --  142  K 3.8  --  4.1  CL 108  --  109  CO2 24  --  20*  GLUCOSE 144*  --  105*  BUN 20  --  18  CREATININE 1.48* 1.41* 1.37*  CALCIUM 9.0  --  8.6*   GFR: Estimated Creatinine Clearance: 20.7 mL/min (A) (by C-G formula based on SCr of 1.37 mg/dL (H)). Liver Function Tests: Recent Labs  Lab 10/05/19 1400  AST 16  ALT 12  ALKPHOS 75  BILITOT 1.0  PROT 7.0  ALBUMIN 3.7   No results for input(s): LIPASE, AMYLASE in the last 168 hours. No results for input(s): AMMONIA in the last 168 hours. Coagulation Profile: No results for input(s): INR, PROTIME in the last 168 hours. Cardiac Enzymes: No results for input(s): CKTOTAL, CKMB, CKMBINDEX, TROPONINI in the last 168 hours. BNP (last 3 results) No results for input(s): PROBNP in the last 8760 hours. HbA1C: No results for input(s): HGBA1C in the last  72 hours. CBG: Recent Labs  Lab 10/06/19 0727 10/06/19 1136  GLUCAP 91 102*   Lipid Profile: Recent Labs    10/05/19 2313  CHOL 204*  HDL 62  LDLCALC 128*  TRIG 71  CHOLHDL 3.3   Thyroid Function Tests: No results for input(s): TSH, T4TOTAL, FREET4, T3FREE, THYROIDAB in the last 72 hours. Anemia Panel: No results for input(s): VITAMINB12, FOLATE, FERRITIN, TIBC, IRON, RETICCTPCT in the last 72 hours. Sepsis Labs: No results for input(s): PROCALCITON, LATICACIDVEN in the last 168 hours.  Recent Results (from the past 240 hour(s))  Urine culture     Status: Abnormal   Collection Time: 10/05/19  2:20 PM   Specimen: Urine, Random  Result Value Ref Range Status  Specimen Description URINE, RANDOM  Final   Special Requests   Final    NONE Performed at [facility], [place] [address], [place], [place] [address]    Culture MULTIPLE SPECIES PRESENT, SUGGEST RECOLLECTION (A)  Final   Report Status 10/06/2019 FINAL  Final  SARS CORONAVIRUS 2 (TAT 6-24 HRS) Nasopharyngeal Nasopharyngeal Swab     Status: None   Collection Time: 10/05/19  7:27 PM   Specimen: Nasopharyngeal Swab  Result Value Ref Range Status   SARS Coronavirus 2 NEGATIVE NEGATIVE Final    Comment: (NOTE) SARS-CoV-2 target nucleic acids are NOT DETECTED. The SARS-CoV-2 RNA is generally detectable in upper and lower respiratory specimens during the acute phase of infection. Negative results do not preclude SARS-CoV-2 infection, do not rule out co-infections with other pathogens, and should not be used as the sole basis for treatment or other patient management decisions. Negative results must be combined with clinical observations, patient history, and epidemiological information. The expected result is Negative. Fact Sheet for Patients: SugarRoll.be Fact Sheet for Healthcare Providers: https://www.woods-mathews.com/ This test is not yet approved or cleared by the  [place] FDA and  has been authorized for detection and/or diagnosis of SARS-CoV-2 by FDA under an Emergency Use Authorization (EUA). This EUA will remain  in effect (meaning this test can be used) for the duration of the COVID-19 declaration under Section 56 4(b)(1) of the Act, 21 U.S.C. section 360bbb-3(b)(1), unless the authorization is terminated or revoked sooner. Performed at [facility], [place] [address], [place], [place] [address]       Radiology Studies: DG Chest 2 View  Result Date: 10/05/2019 CLINICAL DATA:  Shortness of breath. EXAM: CHEST - 2 VIEW COMPARISON:  July 16, 2019. FINDINGS: Stable cardiomediastinal silhouette. Atherosclerosis of thoracic aorta is noted. Sternotomy wires are noted. No pneumothorax or pleural effusion is noted. No acute pulmonary disease is noted. Bony thorax is unremarkable. IMPRESSION: No active cardiopulmonary disease. Aortic Atherosclerosis (ICD10-I70.0). Electronically Signed   By: Marijo Conception M.D.   On: 10/05/2019 14:49    Scheduled Meds: . amLODipine  10 mg Oral QHS  . aspirin EC  81 mg Oral QHS  . carvedilol  12.5 mg Oral BID  . chlorthalidone  25 mg Oral Daily  . cholecalciferol  2,000 Units Oral Daily  . enoxaparin (LOVENOX) injection  30 mg Subcutaneous QHS  . gabapentin  600 mg Oral QHS  . hydrALAZINE  50 mg Oral TID  . metFORMIN  500 mg Oral Q supper  . pantoprazole  40 mg Oral Daily  . pravastatin  40 mg Oral q1800  . triamcinolone cream  1 application Topical BID   Continuous Infusions: . sodium chloride 75 mL/hr at 10/06/19 0303     LOS: 0 days   Time spent: 37 minutes   Darliss Cheney, MD [facility]  10/06/2019, 1:40 PM   To contact the attending provider between 7A-7P or the covering provider during after hours 7P-7A, please log into the web site [email or web address].

## 2019-10-07 DIAGNOSIS — Z66 Do not resuscitate: Secondary | ICD-10-CM | POA: Diagnosis not present

## 2019-10-07 DIAGNOSIS — Z515 Encounter for palliative care: Secondary | ICD-10-CM

## 2019-10-07 DIAGNOSIS — G309 Alzheimer's disease, unspecified: Secondary | ICD-10-CM | POA: Diagnosis present

## 2019-10-07 DIAGNOSIS — F02818 Dementia in other diseases classified elsewhere, unspecified severity, with other behavioral disturbance: Secondary | ICD-10-CM | POA: Diagnosis present

## 2019-10-07 DIAGNOSIS — F0281 Dementia in other diseases classified elsewhere with behavioral disturbance: Secondary | ICD-10-CM

## 2019-10-07 LAB — CBC
HCT: 34.6 % — ABNORMAL LOW (ref 36.0–46.0)
Hemoglobin: 10.7 g/dL — ABNORMAL LOW (ref 12.0–15.0)
MCH: 25.1 pg — ABNORMAL LOW (ref 26.0–34.0)
MCHC: 30.9 g/dL (ref 30.0–36.0)
MCV: 81.2 fL (ref 80.0–100.0)
Platelets: 162 10*3/uL (ref 150–400)
RBC: 4.26 MIL/uL (ref 3.87–5.11)
RDW: 15.9 % — ABNORMAL HIGH (ref 11.5–15.5)
WBC: 5.6 10*3/uL (ref 4.0–10.5)
nRBC: 0 % (ref 0.0–0.2)

## 2019-10-07 LAB — GLUCOSE, CAPILLARY
Glucose-Capillary: 143 mg/dL — ABNORMAL HIGH (ref 70–99)
Glucose-Capillary: 138 mg/dL — ABNORMAL HIGH (ref 70–99)
Glucose-Capillary: 135 mg/dL — ABNORMAL HIGH (ref 70–99)

## 2019-10-07 LAB — BASIC METABOLIC PANEL
Anion gap: 12 (ref 5–15)
BUN: 20 mg/dL (ref 8–23)
CO2: 23 mmol/L (ref 22–32)
Calcium: 8.6 mg/dL — ABNORMAL LOW (ref 8.9–10.3)
Chloride: 108 mmol/L (ref 98–111)
Creatinine, Ser: 1.42 mg/dL — ABNORMAL HIGH (ref 0.44–1.00)
GFR calc Af Amer: 37 mL/min — ABNORMAL LOW (ref >60)
GFR calc non Af Amer: 32 mL/min — ABNORMAL LOW (ref >60)
Glucose, Bld: 129 mg/dL — ABNORMAL HIGH (ref 70–99)
Potassium: 3.7 mmol/L (ref 3.5–5.1)
Sodium: 143 mmol/L (ref 135–145)

## 2019-10-07 NOTE — Progress Notes (Signed)
PROGRESS NOTE    Sonya Chavez [place] M [place]  ZDG:[id number] DOB: Nov 22, 1925 DOA: 10/05/2019 PCP: Alysia Penna, MD   Brief Narrative:  HPI: Sonya Chavez Chavez is a 86 y.o. female with medical history significant of coronary artery disease, hypertension, hyperlipidemia, diabetes, osteoarthritis who was brought in by family due to failure to thrive at home.  Patient was generally weak and debilitated.  She is unable to give adequate history.  History obtained over the phone with family member.  She is however initially bedbound in the last few weeks.  Her initial evaluation only shows escalating troponin which is positive.  Not sure if this is new or old.  There is concern for possible cardiac involvement.  Patient's family member indicated history of partial DNR.  He does not want intubation or CPR but patient is therefore being admitted with possible NSTEMI but will have observation.  Ultimate goal is for placement in a skilled facility as she is failing to thrive at home..  ED Course: Temperature 97.6 blood pressure 200/75 pulse 80 respirate 23 oxygen sat 95% on room air.  Chemistry relatively normal except for glucose 144.  Initial troponin was 20 and then second was 41.  EKG showed no acute findings.  Patient being admitted for observation to rule out MI  Assessment & Plan:   Principal Problem:   Weakness generalized Active Problems:   HTN (hypertension)   DM (diabetes mellitus) (HCC)   CAD (coronary artery disease)   CKD (chronic kidney disease), stage III   Elevated troponin   Failure to thrive (0-17)   Generalized weakness/failure to thrive/memory issues/?  Dementia: Patient was slightly lethargic today.  Apparently, she did not sleep well last night.  She was oriented x1 yesterday only.  Talked to patient's son Sonya Chavez Chavez and her sister Sonya Chavez Chavez yesterday and they confirmed that patient has been having memory issues lately and this is her baseline.  She was seen by PT OT and they recommend  SNF.  TOC on board.  Elevated troponin/ NSTEMI: No chest pain or any other symptoms of ACS.  Troponin 20> 41>71.  Echo shows wall motion abnormality and diastolic dysfunction but preserved ejection fraction.  Called and consulted on-call cardiologist Dr. Rosemary Holms who will see patient.  CKD stage IIIb: H&P has stated acute kidney injury however if one would do thorough chart review, it seems like patient has chronic kidney disease stage III and her creatinine as at her baseline.  Watch closely.  Essential hypertension: Blood pressure now fairly controlled.  Continue current regimen.  Hyperlipidemia: Continue home statin  DVT prophylaxis: Lovenox   Code Status: DNR, present on admission.  Family Communication: None present at bedside.  Called and informed patient's son over the phone about new diagnosis of non-ST elevation MI. Patient is from: Home Disposition Plan: SNF Barriers to discharge: New diagnosis of non-ST elevation MI.  Cardiology consultation pending.  Placement pending.  TOC consulted.   Estimated body mass index is 23.41 kg/m as calculated from the following:   Height as of 08/27/19: 5\' 2"  (1.575 m).   Weight as of this encounter: 58.1 kg.      Nutritional status:               Consultants:   None  Procedures:   None  Antimicrobials:   None   Subjective: Patient seen and examined earlier.  She was slightly lethargic.  Reportedly, she did not sleep well last night.  She had no complaints.  She looked comfortable.  Objective:  Vitals:   10/06/19 1912 10/06/19 2119 10/06/19 2229 10/07/19 0625  BP: (!) 138/53 (!) 152/65 (!) 167/68 (!) 105/51  Pulse: 75 76  70  Resp: (!) 24 17  (!) 22  Temp: 97.6 F (36.4 C)   98.1 F (36.7 C)  TempSrc: Oral   Axillary  SpO2: 97% 100%  97%  Weight:    58.1 kg    Intake/Output Summary (Last 24 hours) at 10/07/2019 1340 Last data filed at 10/06/2019 2201 Gross per 24 hour  Intake --  Output 850 ml  Net -850 ml    Filed Weights   10/06/19 0436 10/07/19 0625  Weight: 54.4 kg 58.1 kg    Examination:  General exam: Appears calm and comfortable but lethargic Respiratory system: Clear to auscultation. Respiratory effort normal. Cardiovascular system: S1 & S2 heard, RRR. No JVD, murmurs, rubs, gallops or clicks. No pedal edema. Gastrointestinal system: Abdomen is nondistended, soft and nontender. No organomegaly or masses felt. Normal bowel sounds heard. Central nervous system: Lethargic and oriented x1 only.  No focal neurological deficits. Extremities: Symmetric 5 x 5 power. Skin: No rashes, lesions or ulcers.     Data Reviewed: I have personally reviewed following labs and imaging studies  CBC: Recent Labs  Lab 10/05/19 1400 10/05/19 2313 10/06/19 1250 10/07/19 0412  WBC 5.4 6.5 6.7 5.6  NEUTROABS 3.3  --   --   --   HGB 12.4 12.3 12.1 10.7*  HCT 42.4 40.4 39.7 34.6*  MCV 85.7 84.2 82.5 81.2  PLT 170 171 172 162   Basic Metabolic Panel: Recent Labs  Lab 10/05/19 1400 10/05/19 2313 10/06/19 1250 10/07/19 0412  NA 143  --  142 143  K 3.8  --  4.1 3.7  CL 108  --  109 108  CO2 24  --  20* 23  GLUCOSE 144*  --  105* 129*  BUN 20  --  18 20  CREATININE 1.48* 1.41* 1.37* 1.42*  CALCIUM 9.0  --  8.6* 8.6*   GFR: Estimated Creatinine Clearance: 20 mL/min (A) (by C-G formula based on SCr of 1.42 mg/dL (H)). Liver Function Tests: Recent Labs  Lab 10/05/19 1400  AST 16  ALT 12  ALKPHOS 75  BILITOT 1.0  PROT 7.0  ALBUMIN 3.7   No results for input(s): LIPASE, AMYLASE in the last 168 hours. No results for input(s): AMMONIA in the last 168 hours. Coagulation Profile: No results for input(s): INR, PROTIME in the last 168 hours. Cardiac Enzymes: No results for input(s): CKTOTAL, CKMB, CKMBINDEX, TROPONINI in the last 168 hours. BNP (last 3 results) No results for input(s): PROBNP in the last 8760 hours. HbA1C: No results for input(s): HGBA1C in the last 72  hours. CBG: Recent Labs  Lab 10/06/19 1136 10/06/19 1600 10/06/19 2147 10/07/19 0740 10/07/19 1057  GLUCAP 102* 107* 169* 143* 138*   Lipid Profile: Recent Labs    10/05/19 2313  CHOL 204*  HDL 62  LDLCALC 128*  TRIG 71  CHOLHDL 3.3   Thyroid Function Tests: No results for input(s): TSH, T4TOTAL, FREET4, T3FREE, THYROIDAB in the last 72 hours. Anemia Panel: No results for input(s): VITAMINB12, FOLATE, FERRITIN, TIBC, IRON, RETICCTPCT in the last 72 hours. Sepsis Labs: No results for input(s): PROCALCITON, LATICACIDVEN in the last 168 hours.  Recent Results (from the past 240 hour(s))  Urine culture     Status: Abnormal   Collection Time: 10/05/19  2:20 PM   Specimen: Urine, Random  Result Value Ref Range  Status   Specimen Description URINE, RANDOM  Final   Special Requests   Final    NONE Performed at [facility], [place] [address] [place], [place] [address]    Culture MULTIPLE SPECIES PRESENT, SUGGEST RECOLLECTION (A)  Final   Report Status 10/06/2019 FINAL  Final  SARS CORONAVIRUS 2 (TAT 6-24 HRS) Nasopharyngeal Nasopharyngeal Swab     Status: None   Collection Time: 10/05/19  7:27 PM   Specimen: Nasopharyngeal Swab  Result Value Ref Range Status   SARS Coronavirus 2 NEGATIVE NEGATIVE Final    Comment: (NOTE) SARS-CoV-2 target nucleic acids are NOT DETECTED. The SARS-CoV-2 RNA is generally detectable in upper and lower respiratory specimens during the acute phase of infection. Negative results do not preclude SARS-CoV-2 infection, do not rule out co-infections with other pathogens, and should not be used as the sole basis for treatment or other patient management decisions. Negative results must be combined with clinical observations, patient history, and epidemiological information. The expected result is Negative. Fact Sheet for Patients: SugarRoll.be Fact Sheet for Healthcare  Providers: https://www.woods-mathews.com/ This test is not yet approved or cleared by the [place] FDA and  has been authorized for detection and/or diagnosis of SARS-CoV-2 by FDA under an Emergency Use Authorization (EUA). This EUA will remain  in effect (meaning this test can be used) for the duration of the COVID-19 declaration under Section 56 4(b)(1) of the Act, 21 U.S.C. section 360bbb-3(b)(1), unless the authorization is terminated or revoked sooner. Performed at [facility], [place] [address] [place], [place] [address]       Radiology Studies: DG Chest 2 View  Result Date: 10/05/2019 CLINICAL DATA:  Shortness of breath. EXAM: CHEST - 2 VIEW COMPARISON:  July 16, 2019. FINDINGS: Stable cardiomediastinal silhouette. Atherosclerosis of thoracic aorta is noted. Sternotomy wires are noted. No pneumothorax or pleural effusion is noted. No acute pulmonary disease is noted. Bony thorax is unremarkable. IMPRESSION: No active cardiopulmonary disease. Aortic Atherosclerosis (ICD10-I70.0). Electronically Signed   By: Marijo Conception M.D.   On: 10/05/2019 14:49   ECHOCARDIOGRAM COMPLETE  Result Date: 10/06/2019    ECHOCARDIOGRAM REPORT   Patient Name:   Sonya Chavez Chavez Date of Exam: 10/06/2019 Medical Rec #:  [id number]              Height:       62.0 in Accession #:    [id number]             Weight:       120.0 lb Date of Birth:  Jan 08, 1926              BSA:          1.539 m Patient Age:    9 years               BP:           182/55 mmHg Patient Gender: F                      HR:           56 bpm. Exam Location:  Inpatient Procedure: 2D Echo, Cardiac Doppler and Color Doppler Indications:    Chest Pain 786.50  History:        Patient has prior history of Echocardiogram examinations, most                 recent 07/18/2019. CAD, Arrythmias:LBBB; Risk  Factors:Hypertension, Diabetes and Non-Smoker. PAD. Elevated                 troponin.  Sonographer:     Renella Cunas RDCS Referring Phys: [id number] MOHAMMAD L GARBA IMPRESSIONS  1. Left ventricular ejection fraction, by estimation, is 55%. The left ventricle has normal function. The left ventricle demonstrates regional wall motion abnormalities, septal-lateral dyssynchrony consistent with LBBB. There is moderate left ventricular hypertrophy. Left ventricular diastolic parameters are consistent with Grade I diastolic dysfunction (impaired relaxation).  2. Right ventricular systolic function is mildly reduced. The right ventricular size is normal. Tricuspid regurgitation signal is inadequate for assessing PA pressure.  3. The aortic valve is tricuspid. Aortic valve regurgitation is not visualized. Mild to moderate aortic valve sclerosis/calcification is present, without any evidence of aortic stenosis.  4. The mitral valve is normal in structure and function. No evidence of mitral valve regurgitation. No evidence of mitral stenosis.  5. The IVC was not visualized. FINDINGS  Left Ventricle: Left ventricular ejection fraction, by estimation, is 55%. The left ventricle has normal function. The left ventricle demonstrates regional wall motion abnormalities. The left ventricular internal cavity size was normal in size. There is  moderate left ventricular hypertrophy. Left ventricular diastolic parameters are consistent with Grade I diastolic dysfunction (impaired relaxation). Right Ventricle: The right ventricular size is normal. No increase in right ventricular wall thickness. Right ventricular systolic function is mildly reduced. Tricuspid regurgitation signal is inadequate for assessing PA pressure. Left Atrium: Left atrial size was normal in size. Right Atrium: Right atrial size was normal in size. Pericardium: There is no evidence of pericardial effusion. Mitral Valve: The mitral valve is normal in structure and function. There is moderate calcification of the mitral valve leaflet(s). Moderate mitral annular calcification. No  evidence of mitral valve regurgitation. No evidence of mitral valve stenosis. Tricuspid Valve: The tricuspid valve is normal in structure. Tricuspid valve regurgitation is not demonstrated. Aortic Valve: The aortic valve is tricuspid. Aortic valve regurgitation is not visualized. Mild to moderate aortic valve sclerosis/calcification is present, without any evidence of aortic stenosis. Pulmonic Valve: The pulmonic valve was normal in structure. Pulmonic valve regurgitation is not visualized. No evidence of pulmonic stenosis. Aorta: The aortic root is normal in size and structure. Venous: The inferior vena cava was not well visualized. IAS/Shunts: No atrial level shunt detected by color flow Doppler.  LEFT VENTRICLE PLAX 2D LVIDd:         3.50 cm     Diastology LVIDs:         2.70 cm     LV e' lateral:   3.55 cm/s LV PW:         0.90 cm     LV E/e' lateral: 16.9 LV IVS:        0.90 cm     LV e' medial:    2.98 cm/s                            LV E/e' medial:  20.1  LV Volumes (MOD) LV vol d, MOD A2C: 92.5 ml LV vol d, MOD A4C: 76.0 ml LV vol s, MOD A2C: 48.9 ml LV vol s, MOD A4C: 44.4 ml LV SV MOD A2C:     43.6 ml LV SV MOD A4C:     76.0 ml LV SV MOD BP:      37.6 ml RIGHT VENTRICLE RV S prime:     5.96 cm/s  TAPSE (M-mode): 1.1 cm LEFT ATRIUM             Index       RIGHT ATRIUM          Index LA diam:        2.80 cm 1.82 cm/m  RA Area:     8.47 cm LA Vol (A2C):   28.2 ml 18.33 ml/m RA Volume:   14.30 ml 9.29 ml/m LA Vol (A4C):   17.6 ml 11.44 ml/m LA Biplane Vol: 23.8 ml 15.47 ml/m  AORTIC VALVE LVOT Vmax:   74.70 cm/s LVOT Vmean:  49.900 cm/s LVOT VTI:    0.168 m  AORTA Ao Root diam: 2.60 cm MITRAL VALVE MV Area (PHT): 2.69 cm    SHUNTS MV Decel Time: 282 msec    Systemic VTI: 0.17 m MV E velocity: 59.90 cm/s MV A velocity: 91.70 cm/s MV E/A ratio:  0.65 Marca Ancona MD Electronically signed by Marca Ancona MD Signature Date/Time: 10/06/2019/5:36:27 PM    Final     Scheduled Meds: . amLODipine  10 mg Oral  QHS  . aspirin EC  81 mg Oral QHS  . carvedilol  12.5 mg Oral BID  . chlorthalidone  25 mg Oral Daily  . cholecalciferol  2,000 Units Oral Daily  . enoxaparin (LOVENOX) injection  30 mg Subcutaneous Q24H  . gabapentin  600 mg Oral QHS  . hydrALAZINE  100 mg Oral TID  . pantoprazole  40 mg Oral Daily  . pravastatin  40 mg Oral q1800  . triamcinolone cream  1 application Topical BID   Continuous Infusions: . sodium chloride 75 mL/hr at 10/07/19 1111     LOS: 1 day   Time spent: 33 minutes minutes   Hughie Closs, MD [facility]  10/07/2019, 1:40 PM   To contact the attending provider between 7A-7P or the covering provider during after hours 7P-7A, please log into the web site [email or web address].

## 2019-10-07 NOTE — Consult Note (Signed)
Consultation Note Date: 10/07/2019   Patient Name: Sonya Chavez  DOB: 09-Nov-1925  MRN: [id number]  Age / Sex: 86 y.o., female  PCP: Alysia Penna, MD Referring Physician: Hughie Closs, MD  Reason for Consultation: Establishing goals of care and Psychosocial/spiritual support  HPI/Patient Profile: 86 y.o. female  with past medical history of CAD s/p CABG, CKD 3, SBO, DM2 and dementia who was admitted on 10/05/2019 with failure to thrive. Her troponin was slightly elevated.  She is DNR.  Palliative was consulted for GOC.  Evaluations in the hospital indicate that Sonya Chavez remains confused and needs significant assistance for all ADLs.  She is unable to stand and needs max assist for even bed mobility.  Clinical Assessment and Goals of Care:  I have reviewed medical records including EPIC notes, labs and imaging, received report from the care team, examined the patient and then spoke on the phone with her son Westley Gambles  to discuss diagnosis prognosis, GOC, EOL wishes, disposition and options.  I introduced Palliative Medicine as specialized medical care for people living with serious illness. It focuses on providing relief from the symptoms and stress of a serious illness.   We discussed a brief life review of the patient. Sonya Chavez has 1 son, [name] who lives in [place], [place].  She has been married to her husband for over 50 years.  He is Joe's step father.  Joe explains that for the past 6 months his mother has been having short term memory loss but in the past 1.5 weeks she has become combative in the evenings, she has urinary incontinence, and takes her clothes off.  I asked Joe if he thought this was sundowning.  He replied "exactly".   Joe explains that his mother's appetite is very poor and that she has only been taking a couple of bites at each meal.   He expressed concern for his step  father who is in his 28s and is her full time care taker in the home.  Sonya Chavez has several sisters who try to help with her care but they are also in their 68s and it is a very difficult situation.    We discussed her current illness and what it means in the larger context of her on-going co-morbidities.  Natural disease trajectory and expectations at EOL were discussed.  I talked with Joe about dementia and my concerns that it will continue to get worse.  The hospitalization has probably made it worse.  Changing her living environment to SNF will make it worse.   We discussed concern for even further decrease in her desire to eat and drink when she goes to SNF.  Recurrent dehydration is expected with advancing dementia.  If she requires recurrent hospitalizations for this it will likely mean she is nearing EOL.  Joe understood.    We discussed dementia medications.  Unfortunately she is passed the stage where these would have a positive effect (early dementia).    I shared my concern that if  she is unable to follow instructions she will likely not do well with physical therapy at SNF and therefore will likely never re-gain her mobility.  Advanced directives, concepts specific to code status, artifical feeding and hydration, and rehospitalization were considered and discussed.  Sonya Chavez is a DNR.  Feeding tubes are medically indicated in patient's with advanced dementia.  At this point [name] and his father would want the patient to return to the hospital if it could benefit her.  We discussed Palliative Care follow up at SNF.  Joe would like for Palliative Care to follow his mother at SNF.  Questions and concerns were addressed.  The family was encouraged to call with questions or concerns.    Primary Decision Maker:  NEXT OF KIN Son [name] and Husband make decisions together.    SUMMARY OF RECOMMENDATIONS     Patient is DNR  Feeding tubes are never medically indicated in patient's  with advanced dementia.  Her family wants her to be able to return to the hospital.  Her decision making surrogate is her husband and son together  She has advanced dementia thus medications such as Aricept and Namenda would not have a positive effect   Plan to DC to SNF with Palliative Care to follow at SNF.  Son requests advice with applying for Medicaid.  Palliative inpatient will sign off.  Please call us back if we can be of assistance.  Code Status/Advance Care Planning:  DNR   Symptom Management:   Consider Olanzapine 2.5 mg QHS for sundowning if QTc is not prolonged.  Titrate up to 5.0 mg after 1 week.  Often Haldol is better than Xanax in patient's with dementia as Xanax can cause a paradoxical reaction.  Additional Recommendations (Limitations, Scope, Preferences):  Full Scope Treatment  Palliative Prophylaxis:   Delirium Protocol  Psycho-social/Spiritual:   Desire for further Chaplaincy support: requested.  Prognosis:   Poor PO intake, bedbound status, rapid decline.  Less than 6 months would not be surprising if her trajectory continues.   Discharge Planning: Skilled Nursing Facility for rehab with Palliative care service follow-up      Primary Diagnoses: Present on Admission: . CKD (chronic kidney disease), stage III . Elevated troponin . HTN (hypertension) . CAD (coronary artery disease) . Failure to thrive (0-17)   I have reviewed the medical record, interviewed the patient and family, and examined the patient. The following aspects are pertinent.  Past Medical History:  Diagnosis Date  . Arthritis    "qwhere"  . Coronary artery disease   . Hypercholesteremia   . Hypertension   . Myocardial infarction (HCC) 2003  . SBO (small bowel obstruction) (HCC) 12/18/2016  . Stenosis of subclavian artery (HCC)   . Type II diabetes mellitus (HCC)    Social History   Socioeconomic History  . Marital status: Married    Spouse name: Not on file    . Number of children: 1  . Years of education: Not on file  . Highest education level: Not on file  Occupational History  . Not on file  Tobacco Use  . Smoking status: Never Smoker  . Smokeless tobacco: Never Used  Substance and Sexual Activity  . Alcohol use: No  . Drug use: No  . Sexual activity: Not Currently  Other Topics Concern  . Not on file  Social History Narrative  . Not on file   Social Determinants of Health   Financial Resource Strain:   . Difficulty of Paying Living  Expenses: Not on file  Food Insecurity:   . Worried About Charity fundraiser in the Last Year: Not on file  . Ran Out of Food in the Last Year: Not on file  Transportation Needs:   . Lack of Transportation (Medical): Not on file  . Lack of Transportation (Non-Medical): Not on file  Physical Activity:   . Days of Exercise per Week: Not on file  . Minutes of Exercise per Session: Not on file  Stress:   . Feeling of Stress : Not on file  Social Connections:   . Frequency of Communication with Friends and Family: Not on file  . Frequency of Social Gatherings with Friends and Family: Not on file  . Attends Religious Services: Not on file  . Active Member of Clubs or Organizations: Not on file  . Attends Archivist Meetings: Not on file  . Marital Status: Not on file   Family History  Problem Relation Age of Onset  . Diabetes Father   . Gallbladder disease Neg Hx    Scheduled Meds: . amLODipine  10 mg Oral QHS  . aspirin EC  81 mg Oral QHS  . carvedilol  12.5 mg Oral BID  . chlorthalidone  25 mg Oral Daily  . cholecalciferol  2,000 Units Oral Daily  . enoxaparin (LOVENOX) injection  30 mg Subcutaneous Q24H  . gabapentin  600 mg Oral QHS  . hydrALAZINE  100 mg Oral TID  . pantoprazole  40 mg Oral Daily  . pravastatin  40 mg Oral q1800  . triamcinolone cream  1 application Topical BID   Continuous Infusions: . sodium chloride 75 mL/hr at 10/07/19 1111   PRN  Meds:.acetaminophen, ALPRAZolam, hydrALAZINE, ondansetron (ZOFRAN) IV Allergies  Allergen Reactions  . Statins Other (See Comments)    Myalgias   . Other     NO FOODS WITH SEEDS  . Crestor [Rosuvastatin] Other (See Comments)    Myalgias  . Lipitor [Atorvastatin] Other (See Comments)    myalgias  . Zocor [Simvastatin] Other (See Comments)    myalgias    Review of Systems confused.  Physical Exam  Very pleasant elderly female, confused, talkative, lying in bed CV rrr resp no distress Abdomen soft, nt, nd, +bs Ext 0 edema.  Vital Signs: BP (!) 122/41   Pulse 68   Temp 98 F (36.7 C) (Oral)   Resp (!) 22   Wt 58.1 kg   SpO2 94%   BMI 23.41 kg/m  Pain Scale: 0-10   Pain Score: 0-No pain   SpO2: SpO2: 94 % O2 Device:SpO2: 94 % O2 Flow Rate: .   IO: Intake/output summary:   Intake/Output Summary (Last 24 hours) at 10/07/2019 1805 Last data filed at 10/07/2019 1300 Gross per 24 hour  Intake 150 ml  Output 850 ml  Net -700 ml    LBM:   Baseline Weight: Weight: 54.4 kg Most recent weight: Weight: 58.1 kg     Palliative Assessment/Data: 20 - 30%     Time In: 4:00 Time Out: 5:10 Time Total: 70 min. Visit consisted of counseling and education dealing with the complex and emotionally intense issues surrounding the need for palliative care and symptom management in the setting of serious and potentially life-threatening illness. Greater than 50%  of this time was spent counseling and coordinating care related to the above assessment and plan.  Signed by: Florentina Jenny, PA-C Palliative Medicine  Please contact Palliative Medicine Team phone at [phone] for questions and  concerns.  For individual provider: See Loretha Stapler

## 2019-10-07 NOTE — Consult Note (Signed)
CARDIOLOGY CONSULT NOTE  Patient ID: [place] M Weedman MRN: [id number] DOB/AGE: [birth year] 86 y.o.  Admit date: 10/05/2019 Referring Physician: [facility] Reason for Consultation:  Troponin elevation  HPI:   86 y.o. African American female  with hypertension, hyperlipidemia, DM, CKD 3, CAD s/p CABG in 2003, PAD s/p Lt subclavian artery stenting in 2016, admitted with generalized weakness and failure to thrive. Cardiology consulted for troponin elevation.   She was brought to the hospital by her husband for generalized weakness, knee and toe pain.  Does not have any chest pain.  She has vague memory of shortness of breath, but does not have shortness of breath at this time.  Appears to have dementia, and unable to recall the events clearly.  Past Medical History:  Diagnosis Date  . Arthritis    "qwhere"  . Coronary artery disease   . Hypercholesteremia   . Hypertension   . Myocardial infarction ([place]) 2003  . SBO (small bowel obstruction) ([place]) 12/18/2016  . Stenosis of subclavian artery ([place])   . Type II diabetes mellitus ([place])      Past Surgical History:  Procedure Laterality Date  . ABDOMINAL HYSTERECTOMY  1970  . BOWEL RESECTION N/A 12/20/2016   Procedure: SMALL BOWEL RESECTION;  Surgeon: Coralie Keens, MD;  Location: [facility];  Service: General;  Laterality: N/A;  . CARDIAC CATHETERIZATION    . CATARACT EXTRACTION Bilateral 2000's  . CORONARY ARTERY BYPASS GRAFT  2003   in [place], [place]  . LAPAROTOMY N/A 12/20/2016   Procedure: EXPLORATORY LAPAROTOMY;  Surgeon: Coralie Keens, MD;  Location: [place] [facility];  Service: General;  Laterality: N/A;  . LEFT HEART CATHETERIZATION WITH CORONARY/GRAFT ANGIOGRAM N/A 09/27/2014   Procedure: LEFT HEART CATHETERIZATION WITH Beatrix Fetters;  Surgeon: Laverda Page, MD;  Location: [facility] CATH LAB;  Service: Cardiovascular;  Laterality: N/A;  . UNILATERAL UPPER EXTREMEITY ANGIOGRAM N/A 09/27/2014   Procedure: SUBCLAVIAN  ARTERIOGRAM ;  Surgeon: Laverda Page, MD;  Location: [facility] CATH LAB;  Service: Cardiovascular;  Laterality: N/A;  . UNILATERAL UPPER EXTREMEITY ANGIOGRAM N/A 11/09/2014   Procedure: Left subclavian stent ;  Surgeon: Adrian Prows, MD;  Location: [facility] CATH LAB;  Service: Cardiovascular;  Laterality: N/A;  . VISCERAL ANGIOGRAM N/A 10/25/2014   Procedure: Blanchard Kelch;  Surgeon: Adrian Prows, MD;  Location: [facility] CATH LAB;  Service: Cardiovascular;  Laterality: N/A;     Family History  Problem Relation Age of Onset  . Diabetes Father   . Gallbladder disease Neg Hx      Social History: Social History   Socioeconomic History  . Marital status: Married    Spouse name: Not on file  . Number of children: 1  . Years of education: Not on file  . Highest education level: Not on file  Occupational History  . Not on file  Tobacco Use  . Smoking status: Never Smoker  . Smokeless tobacco: Never Used  Substance and Sexual Activity  . Alcohol use: No  . Drug use: No  . Sexual activity: Not Currently  Other Topics Concern  . Not on file  Social History Narrative  . Not on file   Social Determinants of Health   Financial Resource Strain:   . Difficulty of Paying Living Expenses: Not on file  Food Insecurity:   . Worried About Charity fundraiser in the Last Year: Not on file  . Ran Out of Food in the Last Year: Not on file  Transportation Needs:   . Lack  of Transportation (Medical): Not on file  . Lack of Transportation (Non-Medical): Not on file  Physical Activity:   . Days of Exercise per Week: Not on file  . Minutes of Exercise per Session: Not on file  Stress:   . Feeling of Stress : Not on file  Social Connections:   . Frequency of Communication with Friends and Family: Not on file  . Frequency of Social Gatherings with Friends and Family: Not on file  . Attends Religious Services: Not on file  . Active Member of Clubs or Organizations: Not on file  . Attends Banker  Meetings: Not on file  . Marital Status: Not on file  Intimate Partner Violence:   . Fear of Current or Ex-Partner: Not on file  . Emotionally Abused: Not on file  . Physically Abused: Not on file  . Sexually Abused: Not on file     Medications Prior to Admission  Medication Sig Dispense Refill Last Dose  . acetaminophen (TYLENOL) 500 MG tablet Take 500-1,000 mg by mouth daily.   Past Month at Unknown time  . alendronate (FOSAMAX) 70 MG tablet Take 70 mg by mouth every Monday.    Past Month at Unknown time  . ALPRAZolam (XANAX) 0.5 MG tablet Take 0.5 mg by mouth 3 (three) times daily as needed.   Past Week at Unknown time  . amLODipine (NORVASC) 10 MG tablet Take 10 mg by mouth at bedtime.    Past Week at Unknown time  . aspirin EC 81 MG tablet Take 81 mg by mouth at bedtime.    Past Week at Unknown time  . carvedilol (COREG) 6.25 MG tablet Take 12.5 mg by mouth 2 (two) times daily.   Past Week at Unknown time  . chlorthalidone (HYGROTON) 25 MG tablet Take 25 mg by mouth daily.   Past Week at Unknown time  . Cholecalciferol (VITAMIN D3) 50 MCG (2000 UT) TABS Take 1 tablet by mouth daily.   Past Week at Unknown time  . gabapentin (NEURONTIN) 300 MG capsule Take 600 mg by mouth at bedtime.    Past Week at Unknown time  . hydrALAZINE (APRESOLINE) 50 MG tablet Take 1 tablet (50 mg total) by mouth 3 (three) times daily. 90 tablet 3 Past Week at Unknown time  . lovastatin (MEVACOR) 40 MG tablet Take 1 tablet (40 mg total) by mouth every evening. 30 tablet 11 Past Week at Unknown time  . metFORMIN (GLUCOPHAGE) 500 MG tablet Take 500 mg by mouth at bedtime.   Past Week at Unknown time  . pantoprazole (PROTONIX) 40 MG tablet Take 40 mg by mouth daily.   Past Week at Unknown time  . triamcinolone cream (KENALOG) 0.1 % Apply 1 application topically 2 (two) times daily.   Past Week at Unknown time    Review of Systems  Constitution: Positive for malaise/fatigue. Negative for decreased appetite,  weight gain and weight loss.  HENT: Negative for congestion.   Eyes: Negative for visual disturbance.  Cardiovascular: Negative for chest pain, dyspnea on exertion, leg swelling, palpitations and syncope.  Respiratory: Negative for cough.   Endocrine: Negative for cold intolerance.  Hematologic/Lymphatic: Does not bruise/bleed easily.  Skin: Negative for itching and rash.  Musculoskeletal: Negative for myalgias.  Gastrointestinal: Negative for abdominal pain, nausea and vomiting.  Genitourinary: Negative for dysuria.  Neurological: Negative for dizziness and weakness.  Psychiatric/Behavioral: The patient is not nervous/anxious.   All other systems reviewed and are negative.  Physical Exam: Physical Exam  Constitutional: She appears well-developed and well-nourished. No distress.  HENT:  Head: Normocephalic and atraumatic.  Eyes: Pupils are equal, round, and reactive to light. Conjunctivae are normal.  Neck: No JVD present.  Cardiovascular: Normal rate and regular rhythm. Exam reveals decreased pulses.  Murmur heard.  Early systolic murmur is present with a grade of 2/6 at the upper right sternal border. Pulmonary/Chest: Effort normal and breath sounds normal. She has no wheezes. She has no rales.  Abdominal: Soft. Bowel sounds are normal. There is no rebound.  Musculoskeletal:        General: No edema.  Lymphadenopathy:    She has no cervical adenopathy.  Neurological: She is alert. No cranial nerve deficit.  Oriented to self.  States it is [place], does not recall the name of the president.  Skin: Skin is warm and dry.  Psychiatric: She has a normal mood and affect.  Nursing note and vitals reviewed.    Labs:   Lab Results  Component Value Date   WBC 5.6 10/07/2019   HGB 10.7 (L) 10/07/2019   HCT 34.6 (L) 10/07/2019   MCV 81.2 10/07/2019   PLT 162 10/07/2019    Recent Labs  Lab 10/05/19 1400 10/05/19 2313 10/07/19 0412  NA 143   < > 143  K 3.8   < > 3.7  CL  108   < > 108  CO2 24   < > 23  BUN 20   < > 20  CREATININE 1.48*   < > 1.42*  CALCIUM 9.0   < > 8.6*  PROT 7.0  --   --   BILITOT 1.0  --   --   ALKPHOS 75  --   --   ALT 12  --   --   AST 16  --   --   GLUCOSE 144*   < > 129*   < > = values in this interval not displayed.    Lipid Panel     Component Value Date/Time   CHOL 204 (H) 10/05/2019 2313   TRIG 71 10/05/2019 2313   HDL 62 10/05/2019 2313   CHOLHDL 3.3 10/05/2019 2313   VLDL 14 10/05/2019 2313   LDLCALC 128 (H) 10/05/2019 2313    BNP (last 3 results) Recent Labs    07/16/19 2240  BNP 278.7*    HEMOGLOBIN A1C Lab Results  Component Value Date   HGBA1C 7.7 (H) 07/19/2019   MPG 174.29 07/19/2019   TSH Recent Labs    07/16/19 2240  TSH 3.156      Radiology: DG Chest 2 View  Result Date: 10/05/2019 CLINICAL DATA:  Shortness of breath. EXAM: CHEST - 2 VIEW COMPARISON:  July 16, 2019. FINDINGS: Stable cardiomediastinal silhouette. Atherosclerosis of thoracic aorta is noted. Sternotomy wires are noted. No pneumothorax or pleural effusion is noted. No acute pulmonary disease is noted. Bony thorax is unremarkable. IMPRESSION: No active cardiopulmonary disease. Aortic Atherosclerosis (ICD10-I70.0). Electronically Signed   By: Lupita Raider M.D.   On: 10/05/2019 14:49   ECHOCARDIOGRAM COMPLETE  Result Date: 10/06/2019    ECHOCARDIOGRAM REPORT   Patient Name:   [place] M Verhagen Date of Exam: 10/06/2019 Medical Rec #:  [id number]              Height:       62.0 in Accession #:    [id number]             Weight:  120.0 lb Date of Birth:  04-09-1926              BSA:          1.539 m Patient Age:    89 years               BP:           182/55 mmHg Patient Gender: F                      HR:           56 bpm. Exam Location:  Inpatient Procedure: 2D Echo, Cardiac Doppler and Color Doppler Indications:    Chest Pain 786.50  History:        Patient has prior history of Echocardiogram examinations, most                  recent 07/18/2019. CAD, Arrythmias:LBBB; Risk                 Factors:Hypertension, Diabetes and Non-Smoker. PAD. Elevated                 troponin.  Sonographer:    Renella Cunas RDCS Referring Phys: [id number] MOHAMMAD L GARBA IMPRESSIONS  1. Left ventricular ejection fraction, by estimation, is 55%. The left ventricle has normal function. The left ventricle demonstrates regional wall motion abnormalities, septal-lateral dyssynchrony consistent with LBBB. There is moderate left ventricular hypertrophy. Left ventricular diastolic parameters are consistent with Grade I diastolic dysfunction (impaired relaxation).  2. Right ventricular systolic function is mildly reduced. The right ventricular size is normal. Tricuspid regurgitation signal is inadequate for assessing PA pressure.  3. The aortic valve is tricuspid. Aortic valve regurgitation is not visualized. Mild to moderate aortic valve sclerosis/calcification is present, without any evidence of aortic stenosis.  4. The mitral valve is normal in structure and function. No evidence of mitral valve regurgitation. No evidence of mitral stenosis.  5. The IVC was not visualized. FINDINGS  Left Ventricle: Left ventricular ejection fraction, by estimation, is 55%. The left ventricle has normal function. The left ventricle demonstrates regional wall motion abnormalities. The left ventricular internal cavity size was normal in size. There is  moderate left ventricular hypertrophy. Left ventricular diastolic parameters are consistent with Grade I diastolic dysfunction (impaired relaxation). Right Ventricle: The right ventricular size is normal. No increase in right ventricular wall thickness. Right ventricular systolic function is mildly reduced. Tricuspid regurgitation signal is inadequate for assessing PA pressure. Left Atrium: Left atrial size was normal in size. Right Atrium: Right atrial size was normal in size. Pericardium: There is no evidence of pericardial effusion.  Mitral Valve: The mitral valve is normal in structure and function. There is moderate calcification of the mitral valve leaflet(s). Moderate mitral annular calcification. No evidence of mitral valve regurgitation. No evidence of mitral valve stenosis. Tricuspid Valve: The tricuspid valve is normal in structure. Tricuspid valve regurgitation is not demonstrated. Aortic Valve: The aortic valve is tricuspid. Aortic valve regurgitation is not visualized. Mild to moderate aortic valve sclerosis/calcification is present, without any evidence of aortic stenosis. Pulmonic Valve: The pulmonic valve was normal in structure. Pulmonic valve regurgitation is not visualized. No evidence of pulmonic stenosis. Aorta: The aortic root is normal in size and structure. Venous: The inferior vena cava was not well visualized. IAS/Shunts: No atrial level shunt detected by color flow Doppler.  LEFT VENTRICLE PLAX 2D LVIDd:         3.50 cm  Diastology LVIDs:         2.70 cm     LV e' lateral:   3.55 cm/s LV PW:         0.90 cm     LV E/e' lateral: 16.9 LV IVS:        0.90 cm     LV e' medial:    2.98 cm/s                            LV E/e' medial:  20.1  LV Volumes (MOD) LV vol d, MOD A2C: 92.5 ml LV vol d, MOD A4C: 76.0 ml LV vol s, MOD A2C: 48.9 ml LV vol s, MOD A4C: 44.4 ml LV SV MOD A2C:     43.6 ml LV SV MOD A4C:     76.0 ml LV SV MOD BP:      37.6 ml RIGHT VENTRICLE RV S prime:     5.96 cm/s TAPSE (M-mode): 1.1 cm LEFT ATRIUM             Index       RIGHT ATRIUM          Index LA diam:        2.80 cm 1.82 cm/m  RA Area:     8.47 cm LA Vol (A2C):   28.2 ml 18.33 ml/m RA Volume:   14.30 ml 9.29 ml/m LA Vol (A4C):   17.6 ml 11.44 ml/m LA Biplane Vol: 23.8 ml 15.47 ml/m  AORTIC VALVE LVOT Vmax:   74.70 cm/s LVOT Vmean:  49.900 cm/s LVOT VTI:    0.168 m  AORTA Ao Root diam: 2.60 cm MITRAL VALVE MV Area (PHT): 2.69 cm    SHUNTS MV Decel Time: 282 msec    Systemic VTI: 0.17 m MV E velocity: 59.90 cm/s MV A velocity: 91.70 cm/s MV  E/A ratio:  0.65 Marca Ancona MD Electronically signed by Marca Ancona MD Signature Date/Time: 10/06/2019/5:36:27 PM    Final     Scheduled Meds: . amLODipine  10 mg Oral QHS  . aspirin EC  81 mg Oral QHS  . carvedilol  12.5 mg Oral BID  . chlorthalidone  25 mg Oral Daily  . cholecalciferol  2,000 Units Oral Daily  . enoxaparin (LOVENOX) injection  30 mg Subcutaneous Q24H  . gabapentin  600 mg Oral QHS  . hydrALAZINE  100 mg Oral TID  . pantoprazole  40 mg Oral Daily  . pravastatin  40 mg Oral q1800  . triamcinolone cream  1 application Topical BID   Continuous Infusions: . sodium chloride 75 mL/hr at 10/07/19 1111   PRN Meds:.acetaminophen, ALPRAZolam, hydrALAZINE, ondansetron (ZOFRAN) IV  Results for [place], [place] M (MRN [id number]) as of 10/07/2019 15:24  Ref. Range 10/05/2019 14:00 10/05/2019 16:43 10/05/2019 23:13  Troponin I (High Sensitivity) Latest Ref Range: <18 ng/L 20 (H) 41 (H) 71 (H)    CARDIAC STUDIES:  EKG 10/05/2019: Sinus rhythm 77 bpm. Left atrial enlargement.  LBBB.  Echocardiogram 10/06/2019: 1. Left ventricular ejection fraction, by estimation, is 55%. The left  ventricle has normal function. The left ventricle demonstrates regional  wall motion abnormalities, septal-lateral dyssynchrony consistent with  LBBB. There is moderate left  ventricular hypertrophy. Left ventricular diastolic parameters are  consistent with Grade I diastolic dysfunction (impaired relaxation).  2. Right ventricular systolic function is mildly reduced. The right  ventricular size is normal. Tricuspid regurgitation signal is inadequate  for assessing PA  pressure.  3. The aortic valve is tricuspid. Aortic valve regurgitation is not  visualized. Mild to moderate aortic valve sclerosis/calcification is  present, without any evidence of aortic stenosis.  4. The mitral valve is normal in structure and function. No evidence of  mitral valve regurgitation. No evidence of mitral  stenosis.  5. The IVC was not visualized.   Carotid artery duplex 07/18/2016: Minimal stenosis in the bilateral internal carotid artery (minimal). Antegrade normal vertebral artery flow. Compared to the study done on 02/09/2015, less than 16-49% bilateral carotid stenosis no longer present.  Upper extremity arterial duplex 07/18/2016: Mildly abnormal (biphasic) Doppler waveforms of the right subclavian artery. Normal Doppler waveforms are seen throughout the left upper extremity arteries. Antegrade bilateral normal vertebral waveform noted. Study suggests patent left subclavian stent and insignificant stenosis right subclavian artery. Compared to the study done on 06/30/2013, left subclavian stenosis is no longer present.  Left SCA angioplasty 11/07/2014: Via brachial approach 8.0 x 29 mm Genesis balloon expandable stent.   CABG 06/04/2002: LIMA to LAD and SVG to ramus intermediate and OM1 and OM 2 in a sequential fashion, SVG to distal RCA. Performed at [facility] in [place]. Coronary and subclavian arteriogram on 09/27/2014: Patent LIMA to LAD, SVG to OM1 and OM 3 and SVG to RCA. Mid LAD with a high-grade 99% stenosis but protected by LIMA to LAD. Left subclavian artery 95% stenosis.  Assessment & Recommendations:   86 y.o. African American female  with hypertension, hyperlipidemia, DM, CKD 3, CAD s/p CABG in 2003, PAD s/p Lt subclavian artery stenting in 2016, admitted with generalized weakness and failure to thrive. Cardiology consulted for troponin elevation.   Troponin elevation: No complaints of chest pain.  She is comfortable without any hypoxia or respiratory distress.  High-sensitivity troponin elevation likely supply demand mismatch in the setting of known CAD s/p CABG, and hypertension on admission.  Stated wall motion abnormality on echocardiogram is secondary to postoperative status and left bundle branch block.  Not acute coronary syndrome presentation.  No  invasive work-up or management necessary.  Patient is pleasantly demented, is DNR, and would not benefit from any further troponin check or other cardiac work-up at this time.  Defer management of generalized weakness and eventual disposition to primary team.  I personally spoke with patient's husband over the phone and conveyed my recommendations.  Cardiology will sign off.  Elder NegusManish J Melayna Robarts, MD 10/07/2019, 2:32 PM [facility]. [place] Pager: [phone] Office: [phone] If no answer Cell [phone]

## 2019-10-07 NOTE — TOC Progression Note (Signed)
Transition of Care [facility]) - Progression Note    Patient Details  Name: [place] M Sokoloski MRN: [id number] Date of Birth: May 01, 1926  [facility]) CM/SW Contact  Nonda Lou, [place] Phone Number: 10/07/2019, 3:52 PM  Clinical Narrative:    Patient currently not fully oriented. CSW contacted patient's spouse Windy Fast [phone] and discussed PT recommendation of SNF placement at time of discharge. CSW provided patient's spouse with bed offers and he stated his preference is for [facility]. CSW agreed to keep Windy Fast updated on discharge planning. No further questions reported at this time.   CSW made telephone call to patient's son [name] to provide an update. He was provided information for the chosen facility. He requested to be contacted prior to patient being discharged. CSW will continue to follow for discharge planning needs.   Expected Discharge Plan: Skilled Nursing Facility Barriers to Discharge: Continued Medical Work up  Expected Discharge Plan and Services Expected Discharge Plan: Skilled Nursing Facility     Post Acute Care Choice: Skilled Nursing Facility Living arrangements for the past 2 months: Single Family Home                                       Social Determinants of Health (SDOH) Interventions    Readmission Risk Interventions No flowsheet data found.

## 2019-10-07 NOTE — Progress Notes (Cosign Needed)
Certification Statement Please be advised that the named patient's dementia diagnosis is primary and supersedes her mental illness.

## 2019-10-07 NOTE — Progress Notes (Signed)
Physical Therapy Treatment Patient Details Name: [place] M Considine MRN: [id number] DOB: 1925-11-19 Today's Date: 10/07/2019    History of Present Illness Pt is 86 yo female with PMH for dementia, CAD, CABG, DMII, HTN, HLD, ACS, and SBO who presented to ED with poor appetite and generalized weakness.    PT Comments    Pt did not tolerate tx very well today, she seemed lethargic but also needed increased assistance with all tasks and was noted to have declined balance and coordination. Pt needed mod/max a with all mobility, attempted to sit edge of bed but pt unable to tolerate without extremal support pt noted to be falling backwards and to right in sitting. Attempted standing and had same results pt was extremely retropulsive and at times falling back and to right again. With max cues, verbal and tactile, and max a was minimally able to stand. Pt still extremely confused noted to be talking nonsensically throughout. At this time dc recommendation to a SNF or possibly a memory care unit are still valid. Will continue to follow pt acutely and progress as she tolerates.      Follow Up Recommendations  SNF;Other (comment)     Equipment Recommendations  None recommended by PT    Recommendations for Other Services       Precautions / Restrictions Precautions Precautions: Fall Precaution Comments: cognition Restrictions Weight Bearing Restrictions: No    Mobility  Bed Mobility Overal bed mobility: Needs Assistance Bed Mobility: Supine to Sit;Sit to Supine     Supine to sit: Mod assist Sit to supine: Max assist   General bed mobility comments: initiated motiom but unable to complete w/o mod/max a, needed continued refirectio back to tasks as became preoccupied with leads  Transfers Overall transfer level: Needs assistance Equipment used: Rolling walker (2 wheeled) Transfers: Sit to/from Stand Sit to Stand: Max assist         General transfer comment: max a to stand and  even then leaning backwards, falling back and to right  Ambulation/Gait             General Gait Details: unable to safely ambulate this day as pt unable to stand without max a   Optometrist    Modified Rankin (Stroke Patients Only)       Balance Overall balance assessment: Needs assistance Sitting-balance support: Feet supported;Bilateral upper extremity supported Sitting balance-Leahy Scale: Poor Sitting balance - Comments: falling backwards and to right sitting edge of bed, unable to maintain without external support Postural control: Right lateral lean;Posterior lean Standing balance support: During functional activity;Bilateral upper extremity supported Standing balance-Leahy Scale: Poor Standing balance comment: unable to stand without max a, falling backwards                            Cognition Arousal/Alertness: Lethargic Behavior During Therapy: Flat affect Overall Cognitive Status: No family/caregiver present to determine baseline cognitive functioning                                 General Comments: pt confused more than previous, unable to participate in tx as previous      Exercises      General Comments General comments (skin integrity, edema, etc.): Pt seems to have functional decline since last session, needing increased assist and having poor trunk  control and balance.      Pertinent Vitals/Pain Pain Assessment: Faces Faces Pain Scale: Hurts even more Pain Location: LLE Pain Descriptors / Indicators: Guarding Pain Intervention(s): Limited activity within patient's tolerance;Monitored during session    Home Living                      Prior Function            PT Goals (current goals can now be found in the care plan section) Acute Rehab PT Goals Patient Stated Goal: unable to state PT Goal Formulation: Patient unable to participate in goal setting Time For Goal  Achievement: 10/19/19 Potential to Achieve Goals: Fair Progress towards PT goals: Not progressing toward goals - comment(today seems to have decline)    Frequency    Min 3X/week      PT Plan Current plan remains appropriate    Co-evaluation              AM-PAC PT "6 Clicks" Mobility   Outcome Measure  Help needed turning from your back to your side while in a flat bed without using bedrails?: A Lot Help needed moving from lying on your back to sitting on the side of a flat bed without using bedrails?: A Lot Help needed moving to and from a bed to a chair (including a wheelchair)?: A Lot Help needed standing up from a chair using your arms (e.g., wheelchair or bedside chair)?: A Lot Help needed to walk in hospital room?: Total Help needed climbing 3-5 steps with a railing? : Total 6 Click Score: 10    End of Session Equipment Utilized During Treatment: Gait belt Activity Tolerance: Patient limited by fatigue;Patient limited by lethargy;Patient limited by pain;Treatment limited secondary to medical complications (Comment) Patient left: in bed;with call bell/phone within reach   PT Visit Diagnosis: Muscle weakness (generalized) (M62.81);Unsteadiness on feet (R26.81)     Time: 2458-0998 PT Time Calculation (min) (ACUTE ONLY): 34 min  Charges:  $Therapeutic Activity: 23-37 mins                     Horald Chestnut, PT    Delford Field 10/07/2019, 10:02 AM

## 2019-10-08 DIAGNOSIS — G308 Other Alzheimer's disease: Secondary | ICD-10-CM

## 2019-10-08 DIAGNOSIS — E1165 Type 2 diabetes mellitus with hyperglycemia: Secondary | ICD-10-CM

## 2019-10-08 DIAGNOSIS — I2510 Atherosclerotic heart disease of native coronary artery without angina pectoris: Secondary | ICD-10-CM

## 2019-10-08 DIAGNOSIS — N1831 Chronic kidney disease, stage 3a: Secondary | ICD-10-CM

## 2019-10-08 DIAGNOSIS — R6251 Failure to thrive (child): Secondary | ICD-10-CM

## 2019-10-08 DIAGNOSIS — E118 Type 2 diabetes mellitus with unspecified complications: Secondary | ICD-10-CM

## 2019-10-08 DIAGNOSIS — I10 Essential (primary) hypertension: Secondary | ICD-10-CM

## 2019-10-08 LAB — BASIC METABOLIC PANEL
Anion gap: 11 (ref 5–15)
BUN: 15 mg/dL (ref 8–23)
CO2: 22 mmol/L (ref 22–32)
Calcium: 8.1 mg/dL — ABNORMAL LOW (ref 8.9–10.3)
Chloride: 110 mmol/L (ref 98–111)
Creatinine, Ser: 1.55 mg/dL — ABNORMAL HIGH (ref 0.44–1.00)
GFR calc Af Amer: 33 mL/min — ABNORMAL LOW (ref >60)
GFR calc non Af Amer: 29 mL/min — ABNORMAL LOW (ref >60)
Glucose, Bld: 144 mg/dL — ABNORMAL HIGH (ref 70–99)
Potassium: 3.5 mmol/L (ref 3.5–5.1)
Sodium: 143 mmol/L (ref 135–145)

## 2019-10-08 NOTE — Progress Notes (Signed)
PHYSICAL THERAPY PROGRESS REPORT  CLINICAL IMPRESSION: Pt did very well with tx this day. Last session was at approx same time of day but, pt was lethargic and groggy needing significantly more assist, noted retropulsion with standing and inability to ambulate for safety concerns. This session pt was able to get to edge of bed with minimal assistance and sit supported by BUE without loss of balance or falling backwards. Pt able to scoot to edge with cues, able to stand and pivot to commode, at bed side, with min assist. Once completed using commode needed increased assistance to stand, noted some retropulsion again. With cues for hand placement and walker management assist pt was able to complete sit to stand and pivot to bed with mod a. Pt was also able to ambulate in room approx 57ft with RW and mod a, pt had some difficulty with walker propagation especially with turns. Overall significant improvement from last session.  D/C RECOMMENDATION: SNF     10/08/19 0929  PT Visit Information  Last PT Received On 10/08/19  Assistance Needed +2  Reason for Co-Treatment Necessary to address cognition/behavior during functional activity;Complexity of the patient's impairments (multi-system involvement)  PT goals addressed during session Balance;Mobility/safety with mobility;Proper use of DME;Strengthening/ROM  History of Present Illness Pt is 86 yo female with PMH for dementia, CAD, CABG, DMII, HTN, HLD, ACS, and SBO who presented to ED with poor appetite and generalized weakness.  Subjective Data  Patient Stated Goal did not voice  Precautions  Precautions Fall  Precaution Comments cognition  Restrictions  Weight Bearing Restrictions No  Pain Assessment  Pain Assessment No/denies pain  Cognition  Arousal/Alertness Awake/alert  Behavior During Therapy Flat affect  Overall Cognitive Status History of cognitive impairments - at baseline  General Comments seems to be more alert than previous session  but still limited cognitively  Bed Mobility  Overal bed mobility Needs Assistance  Bed Mobility Supine to Sit;Sit to Supine  Supine to sit Min assist  Sit to supine Min assist  General bed mobility comments needed set up and cues to complete  Transfers  Overall transfer level Needs assistance  Equipment used Rolling walker (2 wheeled)  Transfers Sit to/from [employer] Transfers  Sit to Stand Mod assist;+2 safety/equipment  Stand pivot transfers Min assist;+2 safety/equipment  Ambulation/Gait  Ambulation/Gait assistance Mod assist  Gait Distance (Feet) 36 Feet  Assistive device Rolling walker (2 wheeled)  Gait Pattern/deviations Decreased stride length;Staggering left;Staggering right;Shuffle;Narrow base of support  General Gait Details was able to ambulate in room with mod a and RW, needed increased assist with turns and some walker management  Gait velocity decreased  Balance  Overall balance assessment Needs assistance  Sitting-balance support Feet supported  Sitting balance-Leahy Scale Fair  Sitting balance - Comments able to scoot to egde of bed and also sit unsupported today  Standing balance support During functional activity;Bilateral upper extremity supported  Standing balance-Leahy Scale Poor  Standing balance comment retropulsive in stance needing mod a to maintain  PT - End of Session  Equipment Utilized During Treatment Gait belt  Activity Tolerance Patient limited by fatigue;Patient limited by lethargy;Patient tolerated treatment well  Patient left in bed;with call bell/phone within reach;with bed alarm set  Nurse Communication Mobility status   PT - Assessment/Plan  PT Plan Current plan remains appropriate  PT Visit Diagnosis Muscle weakness (generalized) (M62.81);Unsteadiness on feet (R26.81)  PT Frequency (ACUTE ONLY) Min 3X/week  Follow Up Recommendations SNF;Other (comment)  PT equipment None recommended by  PT  AM-PAC PT "6 Clicks" Mobility Outcome  Measure (Version 2)  Help needed turning from your back to your side while in a flat bed without using bedrails? 3  Help needed moving from lying on your back to sitting on the side of a flat bed without using bedrails? 3  Help needed moving to and from a bed to a chair (including a wheelchair)? 2  Help needed standing up from a chair using your arms (e.g., wheelchair or bedside chair)? 2  Help needed to walk in hospital room? 2  Help needed climbing 3-5 steps with a railing?  1  6 Click Score 13  Consider Recommendation of Discharge To: CIR/SNF/LTACH  PT Goal Progression  Progress towards PT goals  (pt has one day of progress then another of non progression)  Acute Rehab PT Goals  PT Goal Formulation Patient unable to participate in goal setting  Time For Goal Achievement 10/19/19  Potential to Achieve Goals Fair  PT Time Calculation  PT Start Time (ACUTE ONLY) 0849  PT Stop Time (ACUTE ONLY) 0929  PT Time Calculation (min) (ACUTE ONLY) 40 min  PT General Charges  $$ ACUTE PT VISIT 1 Visit  PT Treatments  $Gait Training 8-22 mins    Drema Pry, PT

## 2019-10-08 NOTE — Social Work (Signed)
CSW uploaded dementia certification statement to [employer].   Verlon Au, CSW [phone]

## 2019-10-08 NOTE — Progress Notes (Signed)
Responded to Spiritual Consult.  Initiated a Dealer of presence with Mrs. Gangl and her husband.  Sat by her bedside and heard stories of her family, sons and grandchildren. 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 We had a few good laughs. Such a pleasant visit.  Chaplain available for return visit at any time.  Vernell Morgans Chaplain Resident

## 2019-10-08 NOTE — TOC Progression Note (Addendum)
Transition of Care [facility]) - Progression Note    Patient Details  Name: Sonya Chavez MRN: [id number] Date of Birth: Oct 29, 1925  Transition of Care [facility]) CM/SW Contact  Nonda Lou, [place] Phone Number: 10/08/2019, 1:21 PM  Clinical Narrative:    CSW made telephone call to patient's son Joey to inform him that patient's discharge to [facility] is still pending. He confirmed that he would like to be notified prior to patient's discharge.  Passr still pending, need dementia certification statement signed by MD. CSW has made request. TOC team will continue to follow & assist with discharge planning needs.   Expected Discharge Plan: Skilled Nursing Facility Barriers to Discharge: Continued Medical Work up  Expected Discharge Plan and Services Expected Discharge Plan: Skilled Nursing Facility     Post Acute Care Choice: Skilled Nursing Facility Living arrangements for the past 2 months: Single Family Home                                       Social Determinants of Health (SDOH) Interventions    Readmission Risk Interventions No flowsheet data found.

## 2019-10-08 NOTE — Progress Notes (Signed)
PROGRESS NOTE    [place] Sonya Paganelli  ZJQ:[id number] DOB: [birth year] DOA: 10/05/2019 PCP: Alysia Penna, MD     Brief Narrative:  [place] Sonya Richardsonis a 86 y.o.BF PMHx CAD, HTN, HLD, DM type II uncontrolled with complication,   Brought in by family due to failure to thrive at home. Patient was generally weak and debilitated. She is unable to give adequate history. History obtained over the phone with family member. She is however initially bedbound in the last few weeks. Her initial evaluation only shows escalating troponin which is positive. Not sure if this is new or old. There is concern for possible cardiac involvement. Patient's family member indicated history of partial DNR. He does not want intubation or CPR but patient is therefore being admitted with possible NSTEMI but will have observation. Ultimate goal is for placement in a skilled facility as she is failing to thrive at home..  ED Course:Temperature 97.6 blood pressure 200/75 pulse 80 respirate 23 oxygen sat 95% on room air. Chemistry relatively normal except for glucose 144. Initial troponin was 20and then second was 41. EKG showed no acute findings. Patient being admitted for observation to rule out MI   Subjective: A/O x3 (does not know where,), states wants to go home does not want to go to SNF.  Negative CP, negative S OB, negative abdominal pain.   Assessment & Plan:   Principal Problem:   Weakness generalized Active Problems:   HTN (hypertension)   DM (diabetes mellitus) (HCC)   CAD (coronary artery disease)   CKD (chronic kidney disease), stage III   Elevated troponin   Failure to thrive (0-17)   DNR (do not resuscitate)   Alzheimer's dementia with behavioral disturbance [facility])   Palliative care encounter   Generalized weakness/failure to thrive/memory issues/?  Dementia: Patient was slightly lethargic today.  Apparently, she did not sleep well last night.  She was oriented x1  yesterday only.  Talked to patient's son [name] and her sister [name] yesterday and they confirmed that patient has been having memory issues lately and this is her baseline.  She was seen by PT OT and they recommend SNF.  TOC on board.  Elevated troponin/ NSTEMI: No chest pain or any other symptoms of ACS.  Troponin 20> 41>71.  Echo shows wall motion abnormality and diastolic dysfunction but preserved ejection fraction.  Called and consulted on-call cardiologist Dr. Rosemary Holms who will see patient.  CKD stage IIIb: H&P has stated acute kidney injury however if one would do thorough chart review, it seems like patient has chronic kidney disease stage III and her creatinine as at her baseline.  Watch closely.  Essential hypertension: Blood pressure now fairly controlled.  Continue current regimen.  Hyperlipidemia: Continue home statin   DVT prophylaxis:  Code Status:  Family Communication:  Disposition Plan:    Consultants:    Procedures/Significant Events:    I have personally reviewed and interpreted all radiology studies and my findings are as above.  VENTILATOR SETTINGS:    Cultures   Antimicrobials:    Devices    LINES / TUBES:      Continuous Infusions: . sodium chloride 75 mL/hr at 10/08/19 1316     Objective: Vitals:   10/08/19 0310 10/08/19 1015 10/08/19 1531 10/08/19 1736  BP: 108/70 (!) 121/41 (!) 125/40 (!) 129/47  Pulse: 69 (!) 57 64   Resp: 17 19 19    Temp:   98.2 F (36.8 C)   TempSrc:   Oral   SpO2: 95%  96% 97%   Weight: 58.1 kg       Intake/Output Summary (Last 24 hours) at 10/08/2019 1908 Last data filed at 10/08/2019 1100 Gross per 24 hour  Intake 1274.95 ml  Output --  Net 1274.95 ml   Filed Weights   10/06/19 0436 10/07/19 0625 10/08/19 0310  Weight: 54.4 kg 58.1 kg 58.1 kg    Examination:  General: A/O x3 (does not know where), no acute respiratory distress, cachectic Eyes: negative scleral hemorrhage, negative anisocoria,  negative icterus ENT: Negative Runny nose, negative gingival bleeding, Neck:  Negative scars, masses, torticollis, lymphadenopathy, JVD Lungs: Clear to auscultation bilaterally without wheezes or crackles Cardiovascular: Regular rate and rhythm without murmur gallop or rub normal S1 and S2 Abdomen: negative abdominal pain, nondistended, positive soft, bowel sounds, no rebound, no ascites, no appreciable mass Extremities: No significant cyanosis, clubbing, or edema bilateral lower extremities Skin: Negative rashes, lesions, ulcers Psychiatric:  Negative depression, negative anxiety, negative fatigue, negative mania  Central nervous system:  Cranial nerves II through XII intact, tongue/uvula midline, all extremities muscle strength 5/5, sensation intact throughout, negative dysarthria, negative expressive aphasia, negative receptive aphasia.  .     Data Reviewed: Care during the described time interval was provided by me .  I have reviewed this patient's available data, including medical history, events of note, physical examination, and all test results as part of my evaluation.  CBC: Recent Labs  Lab 10/05/19 1400 10/05/19 2313 10/06/19 1250 10/07/19 0412  WBC 5.4 6.5 6.7 5.6  NEUTROABS 3.3  --   --   --   HGB 12.4 12.3 12.1 10.7*  HCT 42.4 40.4 39.7 34.6*  MCV 85.7 84.2 82.5 81.2  PLT 170 171 172 162   Basic Metabolic Panel: Recent Labs  Lab 10/05/19 1400 10/05/19 2313 10/06/19 1250 10/07/19 0412 10/08/19 0331  NA 143  --  142 143 143  K 3.8  --  4.1 3.7 3.5  CL 108  --  109 108 110  CO2 24  --  20* 23 22  GLUCOSE 144*  --  105* 129* 144*  BUN 20  --  18 20 15   CREATININE 1.48* 1.41* 1.37* 1.42* 1.55*  CALCIUM 9.0  --  8.6* 8.6* 8.1*   GFR: Estimated Creatinine Clearance: 18.3 mL/min (A) (by C-G formula based on SCr of 1.55 mg/dL (H)). Liver Function Tests: Recent Labs  Lab 10/05/19 1400  AST 16  ALT 12  ALKPHOS 75  BILITOT 1.0  PROT 7.0  ALBUMIN 3.7   No  results for input(s): LIPASE, AMYLASE in the last 168 hours. No results for input(s): AMMONIA in the last 168 hours. Coagulation Profile: No results for input(s): INR, PROTIME in the last 168 hours. Cardiac Enzymes: No results for input(s): CKTOTAL, CKMB, CKMBINDEX, TROPONINI in the last 168 hours. BNP (last 3 results) No results for input(s): PROBNP in the last 8760 hours. HbA1C: No results for input(s): HGBA1C in the last 72 hours. CBG: Recent Labs  Lab 10/06/19 1600 10/06/19 2147 10/07/19 0740 10/07/19 1057 10/07/19 1624  GLUCAP 107* 169* 143* 138* 135*   Lipid Profile: Recent Labs    10/05/19 2313  CHOL 204*  HDL 62  LDLCALC 128*  TRIG 71  CHOLHDL 3.3   Thyroid Function Tests: No results for input(s): TSH, T4TOTAL, FREET4, T3FREE, THYROIDAB in the last 72 hours. Anemia Panel: No results for input(s): VITAMINB12, FOLATE, FERRITIN, TIBC, IRON, RETICCTPCT in the last 72 hours. Sepsis Labs: No results for input(s): PROCALCITON, LATICACIDVEN  in the last 168 hours.  Recent Results (from the past 240 hour(s))  Urine culture     Status: Abnormal   Collection Time: 10/05/19  2:20 PM   Specimen: Urine, Random  Result Value Ref Range Status   Specimen Description URINE, RANDOM  Final   Special Requests   Final    NONE Performed at [facility], [address], [place], [place] [address]    Culture MULTIPLE SPECIES PRESENT, SUGGEST RECOLLECTION (A)  Final   Report Status 10/06/2019 FINAL  Final  SARS CORONAVIRUS 2 (TAT 6-24 HRS) Nasopharyngeal Nasopharyngeal Swab     Status: None   Collection Time: 10/05/19  7:27 PM   Specimen: Nasopharyngeal Swab  Result Value Ref Range Status   SARS Coronavirus 2 NEGATIVE NEGATIVE Final    Comment: (NOTE) SARS-CoV-2 target nucleic acids are NOT DETECTED. The SARS-CoV-2 RNA is generally detectable in upper and lower respiratory specimens during the acute phase of infection. Negative results do not preclude SARS-CoV-2  infection, do not rule out co-infections with other pathogens, and should not be used as the sole basis for treatment or other patient management decisions. Negative results must be combined with clinical observations, patient history, and epidemiological information. The expected result is Negative. Fact Sheet for Patients: SugarRoll.be Fact Sheet for Healthcare Providers: https://www.Kirin Pastorino-mathews.com/ This test is not yet approved or cleared by the [place] FDA and  has been authorized for detection and/or diagnosis of SARS-CoV-2 by FDA under an Emergency Use Authorization (EUA). This EUA will remain  in effect (meaning this test can be used) for the duration of the COVID-19 declaration under Section 56 4(b)(1) of the Act, 21 U.S.C. section 360bbb-3(b)(1), unless the authorization is terminated or revoked sooner. Performed at [facility], [place] [address] [place], [place] [address]          Radiology Studies: No results found.      Scheduled Meds: . amLODipine  10 mg Oral QHS  . aspirin EC  81 mg Oral QHS  . carvedilol  12.5 mg Oral BID  . chlorthalidone  25 mg Oral Daily  . cholecalciferol  2,000 Units Oral Daily  . enoxaparin (LOVENOX) injection  30 mg Subcutaneous Q24H  . gabapentin  600 mg Oral QHS  . hydrALAZINE  100 mg Oral TID  . pantoprazole  40 mg Oral Daily  . pravastatin  40 mg Oral q1800  . triamcinolone cream  1 application Topical BID   Continuous Infusions: . sodium chloride 75 mL/hr at 10/08/19 1316     LOS: 2 days    Time spent:40 min    Cait Locust, Geraldo Docker, MD [facility] Pager [phone]  If 7PM-7AM, please contact night-coverage [email or web address] Password [facility] 10/08/2019, 7:08 PM

## 2019-10-08 NOTE — Progress Notes (Signed)
Occupational Therapy Treatment Patient Details Name: [place] M Wendorf MRN: [id number] DOB: 09-28-1925 Today's Date: 10/08/2019    History of present illness Pt is 86 yo female with PMH for dementia, CAD, CABG, DMII, HTN, HLD, ACS, and SBO who presented to ED with poor appetite and generalized weakness.   OT comments  Patient seen for OT/PT session.  Patient demonstrated ability to complete bed mobility with min assist, toilet transfers with min-mod assist +2 safety, toileting with max assist and LB ADLs with max assist. Patient remains limited by generalized weakness, impaired balance, decreased activity tolerance and cognition.  Requires increased assist with ADLs, functional mobility with fatigue requiring increased physical support due to posterior lean, multimodal cueing to sequence.  SNF remains appropriate, believe she would benefit from a memory care unit.    Follow Up Recommendations  SNF;Supervision/Assistance - 24 hour(memory care)    Equipment Recommendations  Other (comment)(TBD at next venue of care)    Recommendations for Other Services      Precautions / Restrictions Precautions Precautions: Fall Precaution Comments: cognition Restrictions Weight Bearing Restrictions: No       Mobility Bed Mobility Overal bed mobility: Needs Assistance Bed Mobility: Supine to Sit;Sit to Supine     Supine to sit: Min assist Sit to supine: Min assist   General bed mobility comments: min assist to transition to/from EOB, increased time and effort with assist to complete motion and provide multimodal cueing   Transfers Overall transfer level: Needs assistance Equipment used: None;Rolling walker (2 wheeled) Transfers: Sit to/from [employer] Sit to Stand: Mod assist;+2 safety/equipment Stand pivot transfers: Min assist;+2 safety/equipment       General transfer comment: pt initally transferred stand pivot to [facility] with min assist to steady and sequence  technique, but then required increased assist to transition back to EOB with posterior lean, step by step cueing for technique and safety     Balance Overall balance assessment: Needs assistance Sitting-balance support: No upper extremity supported;Feet supported Sitting balance-Leahy Scale: Fair     Standing balance support: Bilateral upper extremity supported;During functional activity Standing balance-Leahy Scale: Poor Standing balance comment: relaint on BUE and external support, when fatigued noted increased posterior lean                            ADL either performed or assessed with clinical judgement   ADL Overall ADL's : Needs assistance/impaired     Grooming: Wash/dry hands;Set up;Bed level               Lower Body Dressing: Maximal assistance;Sit to/from stand Lower Body Dressing Details (indicate cue type and reason): assist for socks, min- mod assist sit to stand  Toilet Transfer: Minimal assistance;Moderate assistance;+2 for safety/equipment;Stand-pivot;RW;BSC   Toileting- Clothing Manipulation and Hygiene: Maximal assistance;+2 for safety/equipment;Sit to/from stand Toileting - Clothing Manipulation Details (indicate cue type and reason): able to pull mesh underwear down, but requires assist for hygiene and clothing up       Functional mobility during ADLs: Minimal assistance;Moderate assistance;+2 for safety/equipment;Rolling walker;Cueing for safety;Cueing for sequencing General ADL Comments: pt limited by cognition, weakness, balance      Vision       Perception     Praxis      Cognition Arousal/Alertness: Awake/alert Behavior During Therapy: Flat affect Overall Cognitive Status: History of cognitive impairments - at baseline  General Comments: patient with hx of cognitive deficits, following commands with increased time, requires increased multimodal cueing with fatigue.  Oriented to self  and place.         Exercises     Shoulder Instructions       General Comments VSS, on RA; RN informed of +BM, voiding urine on BSC now needing new purewick    Pertinent Vitals/ Pain       Pain Assessment: Faces Faces Pain Scale: No hurt  Home Living                                          Prior Functioning/Environment              Frequency  Min 2X/week        Progress Toward Goals  OT Goals(current goals can now be found in the care plan section)  Progress towards OT goals: Progressing toward goals  Acute Rehab OT Goals Patient Stated Goal: none stated  Plan Discharge plan remains appropriate;Frequency remains appropriate    Co-evaluation    PT/OT/SLP Co-Evaluation/Treatment: Yes Reason for Co-Treatment: Necessary to address cognition/behavior during functional activity;For patient/therapist safety;To address functional/ADL transfers   OT goals addressed during session: ADL's and self-care      AM-PAC OT "6 Clicks" Daily Activity     Outcome Measure   Help from another person eating meals?: A Little Help from another person taking care of personal grooming?: A Little Help from another person toileting, which includes using toliet, bedpan, or urinal?: A Lot Help from another person bathing (including washing, rinsing, drying)?: A Lot Help from another person to put on and taking off regular upper body clothing?: A Lot Help from another person to put on and taking off regular lower body clothing?: A Lot 6 Click Score: 14    End of Session Equipment Utilized During Treatment: Gait belt;Rolling walker  OT Visit Diagnosis: Unsteadiness on feet (R26.81);Muscle weakness (generalized) (M62.81);Other abnormalities of gait and mobility (R26.89)   Activity Tolerance Patient tolerated treatment well   Patient Left in bed;with call bell/phone within reach;with bed alarm set   Nurse Communication Mobility status;Other (comment)(BM, voiding  urine)        Time: 0350-0938 OT Time Calculation (min): 41 min  Charges: OT General Charges $OT Visit: 1 Visit OT Treatments $Self Care/Home Management : 23-37 mins  [place] Pager [phone] Office [phone]    Delight Stare 10/08/2019, 10:10 AM

## 2019-10-08 NOTE — Progress Notes (Addendum)
Patient BP 121/41 Left arm, 122/37 Right arm 7 HR 59. MD Joseph Art notified Hydralazine held for am dose. Patient arousable and alert with history of dementia. I will continue to monitor the patient closely.   Sheppard Evens RN   Held evening dose of Hydralazine related to soft BP. MD Joseph Art notified.

## 2019-10-09 DIAGNOSIS — I5031 Acute diastolic (congestive) heart failure: Secondary | ICD-10-CM

## 2019-10-09 DIAGNOSIS — I214 Non-ST elevation (NSTEMI) myocardial infarction: Principal | ICD-10-CM

## 2019-10-09 LAB — CBC
HCT: 30.6 % — ABNORMAL LOW (ref 36.0–46.0)
Hemoglobin: 9.2 g/dL — ABNORMAL LOW (ref 12.0–15.0)
MCH: 25.2 pg — ABNORMAL LOW (ref 26.0–34.0)
MCHC: 30.1 g/dL (ref 30.0–36.0)
MCV: 83.8 fL (ref 80.0–100.0)
Platelets: 141 10*3/uL — ABNORMAL LOW (ref 150–400)
RBC: 3.65 MIL/uL — ABNORMAL LOW (ref 3.87–5.11)
RDW: 15.9 % — ABNORMAL HIGH (ref 11.5–15.5)
WBC: 6.1 10*3/uL (ref 4.0–10.5)
nRBC: 0 % (ref 0.0–0.2)

## 2019-10-09 LAB — COMPREHENSIVE METABOLIC PANEL
ALT: 8 U/L (ref 0–44)
AST: 14 U/L — ABNORMAL LOW (ref 15–41)
Albumin: 2.5 g/dL — ABNORMAL LOW (ref 3.5–5.0)
Alkaline Phosphatase: 53 U/L (ref 38–126)
Anion gap: 9 (ref 5–15)
BUN: 15 mg/dL (ref 8–23)
CO2: 21 mmol/L — ABNORMAL LOW (ref 22–32)
Calcium: 7.7 mg/dL — ABNORMAL LOW (ref 8.9–10.3)
Chloride: 112 mmol/L — ABNORMAL HIGH (ref 98–111)
Creatinine, Ser: 1.63 mg/dL — ABNORMAL HIGH (ref 0.44–1.00)
GFR calc Af Amer: 31 mL/min — ABNORMAL LOW (ref >60)
GFR calc non Af Amer: 27 mL/min — ABNORMAL LOW (ref >60)
Glucose, Bld: 154 mg/dL — ABNORMAL HIGH (ref 70–99)
Potassium: 3.3 mmol/L — ABNORMAL LOW (ref 3.5–5.1)
Sodium: 142 mmol/L (ref 135–145)
Total Bilirubin: 0.4 mg/dL (ref 0.3–1.2)
Total Protein: 4.9 g/dL — ABNORMAL LOW (ref 6.5–8.1)

## 2019-10-09 LAB — HEMOGLOBIN A1C
Hgb A1c MFr Bld: 7 % — ABNORMAL HIGH (ref 4.8–5.6)
Mean Plasma Glucose: 154.2 mg/dL

## 2019-10-09 LAB — PHOSPHORUS: Phosphorus: 2.5 mg/dL (ref 2.5–4.6)

## 2019-10-09 LAB — LIPID PANEL
Cholesterol: 145 mg/dL (ref 0–200)
HDL: 36 mg/dL — ABNORMAL LOW (ref >40)
LDL Cholesterol: 93 mg/dL (ref 0–99)
Total CHOL/HDL Ratio: 4 RATIO
Triglycerides: 80 mg/dL (ref <150)
VLDL: 16 mg/dL (ref 0–40)

## 2019-10-09 LAB — MAGNESIUM: Magnesium: 1.5 mg/dL — ABNORMAL LOW (ref 1.7–2.4)

## 2019-10-09 MED ORDER — MAGNESIUM SULFATE 50 % IJ SOLN
3.0000 g | Freq: Once | INTRAVENOUS | Status: AC
  Administered 2019-10-09: 3 g via INTRAVENOUS
  Filled 2019-10-09: qty 6

## 2019-10-09 MED ORDER — POTASSIUM CHLORIDE CRYS ER 20 MEQ PO TBCR
60.0000 meq | EXTENDED_RELEASE_TABLET | Freq: Once | ORAL | Status: AC
  Administered 2019-10-09: 60 meq via ORAL
  Filled 2019-10-09: qty 3

## 2019-10-09 MED ORDER — DEXTROSE-NACL 5-0.9 % IV SOLN: INTRAVENOUS | Status: DC

## 2019-10-09 NOTE — Plan of Care (Signed)

## 2019-10-09 NOTE — Progress Notes (Signed)
PROGRESS NOTE    Sonya Chavez  IFO:[id number] DOB: April 29, 1926 DOA: 10/05/2019 PCP: Alysia Penna, MD     Brief Narrative:  Sonya Chavez a 86 y.o.BF PMHx CAD, CABG 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 Patient was generally weak and debilitated. She is unable to give adequate history. History obtained over the phone with family member. She is however initially bedbound in the last few weeks. Her initial evaluation only shows escalating troponin which is positive. Not sure if this is new or old. There is concern for possible cardiac involvement. Patient's family member indicated history of partial DNR. He does not want intubation or CPR but patient is therefore being admitted with possible NSTEMI but will have observation. Ultimate goal is for placement in a skilled facility as she is failing to thrive at home..  ED Course:Temperature 97.6 blood pressure 200/75 pulse 80 respirate 23 oxygen sat 95% on room air. Chemistry relatively normal except for glucose 144. Initial troponin was 20and then second was 41. EKG showed no acute findings. Patient being admitted for observation to rule out MI   Subjective: 3/6 A/O x2 (does not know where, why).  Again states does not want to go to SNF while husband is present.  Negative CP, negative S OB, negative abdominal pain.   Assessment & Plan:   Principal Problem:   Weakness generalized Active Problems:   HTN (hypertension)   DM (diabetes mellitus) (HCC)   CAD (coronary artery disease)   CKD (chronic kidney disease), stage III   Elevated troponin   Failure to thrive (0-17)   DNR (do not resuscitate)   Alzheimer's dementia with behavioral disturbance (HCC)   Palliative care encounter  Generalized weakness/failure to thrive -Out of bed to chair q shift -Ambulate q shift -Husband believes patient needs SNF, or some other  sort of palliative care program given her deteriorating health, patient stating I do not want to go to SNF. -NCM Steward Drone on board -Palliative care consulted  Dementia -Windy Fast (husband) confirms patient has been having increasing memory issues. -A/O x2 today  NSTEMI/Elevated troponin -Trend troponin Results for Vitale, Sony[place] M (MRN [id number]) as of 10/09/2019 07:28  Ref. Range 10/05/2019 14:00 10/05/2019 16:43 10/05/2019 23:13  Troponin I (High Sensitivity) Latest Ref Range: <18 ng/L 20 (H) 41 (H) 71 (H)  -Cardiology consulted Dr. Rosemary Holms -Per Dr. Rosemary Holms cardiology note 3/4; wall motion abnormality on echocardiogram is secondary to postoperative status and left bundle branch block.    NOT ACS presentation.  NO invasive work-up or management required.  He spoke with patient's husband, and conveyed his recommendations..    Acute diastolic CHF -Echocardiogram showed diastolic CHF see below -When you compare current echo to echo from 07/18/2019 appears to have worsening CHF. -Strict in and out -37ml -Daily weight Filed Weights   10/07/19 0625 10/08/19 0310 10/09/19 0508  Weight: 58.1 kg 58.1 kg 58.5 kg  -Amlodipine 10 mg daily -Coreg 12.5 mg BID -Hydralazine 100 mg TID -Hydralazine PRN  Essential HTN -See CHF  HLD -Pravastatin 40 mg daily  CKD stage IIIb (baseline Cr 1.32) Recent Labs  Lab 10/05/19 2313 10/06/19 1250 10/07/19 0412 10/08/19 0331 10/09/19 0428  CREATININE 1.41* 1.37* 1.42* 1.55* 1.63*  -3/6 gentle hydration D5-0.9% saline 55ml/hr  Hypokalemia -Potassium goal> 4 -K-Dur 60 mEq   Hypomagnesmia -Magnesium goal> 2 -Magnesium IV 3 g  Goals of care -PT/OT recommendation; SNF NOTE patient states  every day does not want to go to SNF -3/6 consult Palliative Care; Patient with failure to thrive, husband does not believe he will be able to take care of her long-term at home, interested in resources available to assist   DVT prophylaxis:  Code Status:   Family Communication: 3/6 husband at bedside discussed plan of care answered all questions.  Husband is supposed to speak with wife and children to determine short term/long term care plan.  Disposition Plan:    Consultants:  Cardiology  Procedures/Significant Events:  3/3 Echocardiogram;LVEF=55%.-The left ventricle  demonstrates regional wall motion abnormalities. -Moderate left ventricular hypertrophy.  -Grade 1 diastolic dysfunction left ventricular diastolic    I have personally reviewed and interpreted all radiology studies and my findings are as above.  VENTILATOR SETTINGS:    Cultures   Antimicrobials: Anti-infectives (From admission, onward)   None       Devices    LINES / TUBES:      Continuous Infusions: . sodium chloride 75 mL/hr at 10/09/19 0213     Objective: Vitals:   10/08/19 1531 10/08/19 1736 10/08/19 2131 10/09/19 0508  BP: (!) 125/40 (!) 129/47 135/69 (!) 118/39  Pulse: 64  64 61  Resp: 19  19 (!) 22  Temp: 98.2 F (36.8 C)  98.4 F (36.9 C) 98 F (36.7 C)  TempSrc: Oral  Oral Oral  SpO2: 97%  96% 94%  Weight:    58.5 kg    Intake/Output Summary (Last 24 hours) at 10/09/2019 1884 Last data filed at 10/09/2019 1660 Gross per 24 hour  Intake 494.95 ml  Output --  Net 494.95 ml   Filed Weights   10/07/19 0625 10/08/19 0310 10/09/19 0508  Weight: 58.1 kg 58.1 kg 58.5 kg   Physical Exam:  General: A/O x2 (does not know where, why), no acute respiratory distress, cachectic Eyes: negative scleral hemorrhage, negative anisocoria, negative icterus ENT: Negative Runny nose, negative gingival bleeding, Neck:  Negative scars, masses, torticollis, lymphadenopathy, JVD Lungs: Clear to auscultation bilaterally without wheezes or crackles Cardiovascular: Regular rate and rhythm without murmur gallop or rub normal S1 and S2 Abdomen: negative abdominal pain, nondistended, positive soft, bowel sounds, no rebound, no ascites, no appreciable  mass Extremities: No significant cyanosis, clubbing, or edema bilateral lower extremities Skin: Negative rashes, lesions, ulcers Psychiatric:  Negative depression, negative anxiety, negative fatigue, negative mania  Central nervous system:  Cranial nerves II through XII intact, tongue/uvula midline, all extremities muscle strength 5/5, sensation intact throughout, negative dysarthria, negative expressive aphasia, negative receptive aphasia.  .     Data Reviewed: Care during the described time interval was provided by me .  I have reviewed this patient's available data, including medical history, events of note, physical examination, and all test results as part of my evaluation.  CBC: Recent Labs  Lab 10/05/19 1400 10/05/19 2313 10/06/19 1250 10/07/19 0412 10/09/19 0428  WBC 5.4 6.5 6.7 5.6 6.1  NEUTROABS 3.3  --   --   --   --   HGB 12.4 12.3 12.1 10.7* 9.2*  HCT 42.4 40.4 39.7 34.6* 30.6*  MCV 85.7 84.2 82.5 81.2 83.8  PLT 170 171 172 162 630*   Basic Metabolic Panel: Recent Labs  Lab 10/05/19 1400 10/05/19 1400 10/05/19 2313 10/06/19 1250 10/07/19 0412 10/08/19 0331 10/09/19 0428  NA 143  --   --  142 143 143 142  K 3.8  --   --  4.1 3.7 3.5 3.3*  CL 108  --   --  109 108 110 112*  CO2 24  --   --  20* 23 22 21*  GLUCOSE 144*  --   --  105* 129* 144* 154*  BUN 20  --   --  18 20 15 15   CREATININE 1.48*   < > 1.41* 1.37* 1.42* 1.55* 1.63*  CALCIUM 9.0  --   --  8.6* 8.6* 8.1* 7.7*  MG  --   --   --   --   --   --  1.5*  PHOS  --   --   --   --   --   --  2.5   < > = values in this interval not displayed.   GFR: Estimated Creatinine Clearance: 17.4 mL/min (A) (by C-G formula based on SCr of 1.63 mg/dL (H)). Liver Function Tests: Recent Labs  Lab 10/05/19 1400 10/09/19 0428  AST 16 14*  ALT 12 8  ALKPHOS 75 53  BILITOT 1.0 0.4  PROT 7.0 4.9*  ALBUMIN 3.7 2.5*   No results for input(s): LIPASE, AMYLASE in the last 168 hours. No results for input(s):  AMMONIA in the last 168 hours. Coagulation Profile: No results for input(s): INR, PROTIME in the last 168 hours. Cardiac Enzymes: No results for input(s): CKTOTAL, CKMB, CKMBINDEX, TROPONINI in the last 168 hours. BNP (last 3 results) No results for input(s): PROBNP in the last 8760 hours. HbA1C: Recent Labs    10/09/19 0428  HGBA1C 7.0*   CBG: Recent Labs  Lab 10/06/19 1600 10/06/19 2147 10/07/19 0740 10/07/19 1057 10/07/19 1624  GLUCAP 107* 169* 143* 138* 135*   Lipid Profile: Recent Labs    10/09/19 0428  CHOL 145  HDL 36*  LDLCALC 93  TRIG 80  CHOLHDL 4.0   Thyroid Function Tests: No results for input(s): TSH, T4TOTAL, FREET4, T3FREE, THYROIDAB in the last 72 hours. Anemia Panel: No results for input(s): VITAMINB12, FOLATE, FERRITIN, TIBC, IRON, RETICCTPCT in the last 72 hours. Sepsis Labs: No results for input(s): PROCALCITON, LATICACIDVEN in the last 168 hours.  Recent Results (from the past 240 hour(s))  Urine culture     Status: Abnormal   Collection Time: 10/05/19  2:20 PM   Specimen: Urine, Random  Result Value Ref Range Status   Specimen Description URINE, RANDOM  Final   Special Requests   Final    NONE Performed at [facility], [address], [place], [place]    Culture MULTIPLE SPECIES PRESENT, SUGGEST RECOLLECTION (A)  Final   Report Status 10/06/2019 FINAL  Final  SARS CORONAVIRUS 2 (TAT 6-24 HRS) Nasopharyngeal Nasopharyngeal Swab     Status: None   Collection Time: 10/05/19  7:27 PM   Specimen: Nasopharyngeal Swab  Result Value Ref Range Status   SARS Coronavirus 2 NEGATIVE NEGATIVE Final    Comment: (NOTE) SARS-CoV-2 target nucleic acids are NOT DETECTED. The SARS-CoV-2 RNA is generally detectable in upper and lower respiratory specimens during the acute phase of infection. Negative results do not preclude SARS-CoV-2 infection, do not rule out co-infections with other pathogens, and should not be used as the sole  basis for treatment or other patient management decisions. Negative results must be combined with clinical observations, patient history, and epidemiological information. The expected result is Negative. Fact Sheet for Patients: 12/05/19 Fact Sheet for Healthcare Providers: HairSlick.no This test is not yet approved or cleared by the quierodirigir.com FDA and  has been authorized for detection and/or diagnosis of SARS-CoV-2 by FDA under  an Emergency Use Authorization (EUA). This EUA will remain  in effect (meaning this test can be used) for the duration of the COVID-19 declaration under Section 56 4(b)(1) of the Act, 21 U.S.C. section 360bbb-3(b)(1), unless the authorization is terminated or revoked sooner. Performed at [facility], [address], [place], [place] [address]          Radiology Studies: No results found.      Scheduled Meds: . amLODipine  10 mg Oral QHS  . aspirin EC  81 mg Oral QHS  . carvedilol  12.5 mg Oral BID  . chlorthalidone  25 mg Oral Daily  . cholecalciferol  2,000 Units Oral Daily  . enoxaparin (LOVENOX) injection  30 mg Subcutaneous Q24H  . gabapentin  600 mg Oral QHS  . hydrALAZINE  100 mg Oral TID  . pantoprazole  40 mg Oral Daily  . pravastatin  40 mg Oral q1800  . triamcinolone cream  1 application Topical BID   Continuous Infusions: . sodium chloride 75 mL/hr at 10/09/19 0213     LOS: 3 days    Time spent:40 min    Lawton Dollinger, Roselind Messier, MD [facility] Pager [phone]  If 7PM-7AM, please contact night-coverage [email or web address] Password [facility] 10/09/2019, 7:28 AM

## 2019-10-10 LAB — COMPREHENSIVE METABOLIC PANEL
ALT: 10 U/L (ref 0–44)
AST: 15 U/L (ref 15–41)
Albumin: 2.6 g/dL — ABNORMAL LOW (ref 3.5–5.0)
Alkaline Phosphatase: 56 U/L (ref 38–126)
Anion gap: 8 (ref 5–15)
BUN: 20 mg/dL (ref 8–23)
CO2: 20 mmol/L — ABNORMAL LOW (ref 22–32)
Calcium: 8 mg/dL — ABNORMAL LOW (ref 8.9–10.3)
Chloride: 114 mmol/L — ABNORMAL HIGH (ref 98–111)
Creatinine, Ser: 1.81 mg/dL — ABNORMAL HIGH (ref 0.44–1.00)
GFR calc Af Amer: 28 mL/min — ABNORMAL LOW (ref >60)
GFR calc non Af Amer: 24 mL/min — ABNORMAL LOW (ref >60)
Glucose, Bld: 227 mg/dL — ABNORMAL HIGH (ref 70–99)
Potassium: 4.6 mmol/L (ref 3.5–5.1)
Sodium: 142 mmol/L (ref 135–145)
Total Bilirubin: 0.5 mg/dL (ref 0.3–1.2)
Total Protein: 5.2 g/dL — ABNORMAL LOW (ref 6.5–8.1)

## 2019-10-10 LAB — CBC
HCT: 31.1 % — ABNORMAL LOW (ref 36.0–46.0)
Hemoglobin: 9.4 g/dL — ABNORMAL LOW (ref 12.0–15.0)
MCH: 24.9 pg — ABNORMAL LOW (ref 26.0–34.0)
MCHC: 30.2 g/dL (ref 30.0–36.0)
MCV: 82.5 fL (ref 80.0–100.0)
Platelets: 142 10*3/uL — ABNORMAL LOW (ref 150–400)
RBC: 3.77 MIL/uL — ABNORMAL LOW (ref 3.87–5.11)
RDW: 16 % — ABNORMAL HIGH (ref 11.5–15.5)
WBC: 6.3 10*3/uL (ref 4.0–10.5)
nRBC: 0 % (ref 0.0–0.2)

## 2019-10-10 LAB — MAGNESIUM: Magnesium: 2.2 mg/dL (ref 1.7–2.4)

## 2019-10-10 LAB — PHOSPHORUS: Phosphorus: 2.7 mg/dL (ref 2.5–4.6)

## 2019-10-10 MED ORDER — DEXTROSE 5 % IV SOLN: INTRAVENOUS | Status: DC

## 2019-10-10 MED ORDER — HALOPERIDOL LACTATE 5 MG/ML IJ SOLN
0.5000 mg | Freq: Once | INTRAMUSCULAR | Status: AC
  Administered 2019-10-10: 0.5 mg via INTRAVENOUS
  Filled 2019-10-10: qty 1

## 2019-10-10 MED ORDER — SODIUM CHLORIDE 0.45 % IV SOLN: INTRAVENOUS | Status: DC

## 2019-10-10 MED ORDER — DICLOFENAC SODIUM 1 % EX GEL
4.0000 g | Freq: Four times a day (QID) | CUTANEOUS | Status: DC
  Administered 2019-10-10 – 2019-10-11 (×7): 4 g via TOPICAL
  Filled 2019-10-10: qty 100

## 2019-10-10 NOTE — Progress Notes (Signed)
PROGRESS NOTE    Sonya Chavez Durnin  AXK:[id number] DOB: 1926-06-17 DOA: 10/05/2019 PCP: Alysia Penna, MD     Brief Narrative:  Sonya Chavez Richardsonis a 86 y.o.BF PMHx CAD, CABG 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 Patient was generally weak and debilitated. She is unable to give adequate history. History obtained over the phone with family member. She is however initially bedbound in the last few weeks. Her initial evaluation only shows escalating troponin which is positive. Not sure if this is new or old. There is concern for possible cardiac involvement. Patient's family member indicated history of partial DNR. He does not want intubation or CPR but patient is therefore being admitted with possible NSTEMI but will have observation. Ultimate goal is for placement in a skilled facility as she is failing to thrive at home..  ED Course:Temperature 97.6 blood pressure 200/75 pulse 80 respirate 23 oxygen sat 95% on room air. Chemistry relatively normal except for glucose 144. Initial troponin was 20and then second was 41. EKG showed no acute findings. Patient being admitted for observation to rule out MI   Subjective: 3/7 A/O x2 (does not know where, why).  Patient did know she was in the hospital.  Patient continues to not remember daily conversation regarding her going to an SNF.   Assessment & Plan:   Principal Problem:   Weakness generalized Active Problems:   HTN (hypertension)   DM (diabetes mellitus) (HCC)   CAD (coronary artery disease)   CKD (chronic kidney disease), stage III   Elevated troponin   Failure to thrive (0-17)   DNR (do not resuscitate)   Alzheimer's dementia with behavioral disturbance (HCC)   Palliative care encounter  Generalized weakness/failure to thrive -Out of bed to chair q shift -Ambulate q shift -Husband believes patient needs SNF, or some  other sort of palliative care program given her deteriorating health, patient stating I do not want to go to SNF. -NCM Steward Drone on board -Palliative care consulted  Dementia -Windy Fast (husband) confirms patient has been having increasing memory issues. -A/O x2 today -3/7 Haldol 0.5 mg; will need to be very judicious patient has QT prolongation see below  NSTEMI/Elevated troponin -Trend troponin Results for Whisnant, Sonya Chavez (MRN [id number]) as of 10/09/2019 07:28  Ref. Range 10/05/2019 14:00 10/05/2019 16:43 10/05/2019 23:13  Troponin I (High Sensitivity) Latest Ref Range: <18 ng/L 20 (H) 41 (H) 71 (H)  -Cardiology consulted Dr. Rosemary Holms -Per Dr. Rosemary Holms cardiology note 3/4; wall motion abnormality on echocardiogram is secondary to postoperative status and left bundle branch block.    NOT ACS presentation.  NO invasive work-up or management required.  He spoke with patient's husband, and conveyed his recommendations..    Acute diastolic CHF -Echocardiogram showed diastolic CHF see below -When you compare current echo to echo from 07/18/2019 appears to have worsening CHF. -Strict in and out - -Daily weight Filed Weights   10/08/19 0310 10/09/19 0508 10/10/19 0500  Weight: 58.1 kg 58.5 kg 60.3 kg  -Amlodipine 10 mg daily -Coreg 12.5 mg BID -3/7 Chlorthalidone 25 mg discontinue may be causing/contributing to acute renal failure -Hydralazine 100 mg TID -Hydralazine PRN -Gentle hydration D5 49ml/hr  QT prolongation -3/7 QT> 500 places patient at high risk of torsades.  Must be judicious in use of QT prolonging medication  Essential HTN -See CHF  HLD -Pravastatin 40 mg daily  Acute on CKD stage IIIb (baseline Cr  1.32) Recent Labs  Lab 10/06/19 1250 10/07/19 0412 10/08/19 0331 10/09/19 0428 10/10/19 0327  CREATININE 1.37* 1.42* 1.55* 1.63* 1.81*  -See CHF  Hypokalemia -Potassium goal> 4    Hypomagnesmia -Magnesium goal> 2   Goals of care -PT/OT  recommendation; SNF NOTE patient states every day does not want to go to SNF -3/6 consult Palliative Care; Patient with failure to thrive, husband does not believe he will be able to take care of her long-term at home, interested in resources available to assist   DVT prophylaxis: Lovenox Code Status: DNR Family Communication: 3/7 husband at bedside discussed plan of care answered all questions.  Husband is supposed to speak with wife and children to determine short term/long term care plan.  Disposition Plan: Awaiting [facility]   Consultants:  Cardiology  Procedures/Significant Events:  3/3 Echocardiogram;LVEF=55%.-The left ventricle  demonstrates regional wall motion abnormalities. -Moderate left ventricular hypertrophy.  -Grade 1 diastolic dysfunction left ventricular diastolic    I have personally reviewed and interpreted all radiology studies and my findings are as above.  VENTILATOR SETTINGS:    Cultures   Antimicrobials: Anti-infectives (From admission, onward)   None       Devices    LINES / TUBES:      Continuous Infusions: . dextrose 5 % and 0.9% NaCl 75 mL/hr at 10/09/19 1800     Objective: Vitals:   10/09/19 2046 10/09/19 2152 10/10/19 0500 10/10/19 0533  BP: 134/81 (!) 161/54  (!) 144/45  Pulse: 69   69  Resp: (!) 28   (!) 24  Temp: 98.7 F (37.1 C)   98.1 F (36.7 C)  TempSrc: Oral   Oral  SpO2: 100%   97%  Weight:   60.3 kg     Intake/Output Summary (Last 24 hours) at 10/10/2019 0759 Last data filed at 10/10/2019 0534 Gross per 24 hour  Intake 508.89 ml  Output 1100 ml  Net -591.11 ml   Filed Weights   10/08/19 0310 10/09/19 0508 10/10/19 0500  Weight: 58.1 kg 58.5 kg 60.3 kg   Physical Exam:  General: A/O x2 (does not know where, why), no acute respiratory distress, cachectic Eyes: negative scleral hemorrhage, negative anisocoria, negative icterus ENT: Negative Runny nose, negative gingival bleeding, Neck:  Negative scars, masses,  torticollis, lymphadenopathy, JVD Lungs: Clear to auscultation bilaterally without wheezes or crackles Cardiovascular: Regular rate and rhythm without murmur gallop or rub normal S1 and S2 Abdomen: negative abdominal pain, nondistended, positive soft, bowel sounds, no rebound, no ascites, no appreciable mass Extremities: No significant cyanosis, clubbing, or edema bilateral lower extremities Skin: Negative rashes, lesions, ulcers Psychiatric:  Negative depression, negative anxiety, negative fatigue, negative mania  Central nervous system:  Cranial nerves II through XII intact, tongue/uvula midline, all extremities muscle strength 5/5, sensation intact throughout, negative dysarthria, negative expressive aphasia, negative receptive aphasia.  .     Data Reviewed: Care during the described time interval was provided by me .  I have reviewed this patient's available data, including medical history, events of note, physical examination, and all test results as part of my evaluation.  CBC: Recent Labs  Lab 10/05/19 1400 10/05/19 1400 10/05/19 2313 10/06/19 1250 10/07/19 0412 10/09/19 0428 10/10/19 0327  WBC 5.4   < > 6.5 6.7 5.6 6.1 6.3  NEUTROABS 3.3  --   --   --   --   --   --   HGB 12.4   < > 12.3 12.1 10.7* 9.2* 9.4*  HCT 42.4   < >  40.4 39.7 34.6* 30.6* 31.1*  MCV 85.7   < > 84.2 82.5 81.2 83.8 82.5  PLT 170   < > 171 172 162 141* 142*   < > = values in this interval not displayed.   Basic Metabolic Panel: Recent Labs  Lab 10/06/19 1250 10/07/19 0412 10/08/19 0331 10/09/19 0428 10/10/19 0327  NA 142 143 143 142 142  K 4.1 3.7 3.5 3.3* 4.6  CL 109 108 110 112* 114*  CO2 20* 23 22 21* 20*  GLUCOSE 105* 129* 144* 154* 227*  BUN 18 20 15 15 20   CREATININE 1.37* 1.42* 1.55* 1.63* 1.81*  CALCIUM 8.6* 8.6* 8.1* 7.7* 8.0*  MG  --   --   --  1.5* 2.2  PHOS  --   --   --  2.5 2.7   GFR: Estimated Creatinine Clearance: 17 mL/min (A) (by C-G formula based on SCr of 1.81 mg/dL  (H)). Liver Function Tests: Recent Labs  Lab 10/05/19 1400 10/09/19 0428 10/10/19 0327  AST 16 14* 15  ALT 12 8 10   ALKPHOS 75 53 56  BILITOT 1.0 0.4 0.5  PROT 7.0 4.9* 5.2*  ALBUMIN 3.7 2.5* 2.6*   No results for input(s): LIPASE, AMYLASE in the last 168 hours. No results for input(s): AMMONIA in the last 168 hours. Coagulation Profile: No results for input(s): INR, PROTIME in the last 168 hours. Cardiac Enzymes: No results for input(s): CKTOTAL, CKMB, CKMBINDEX, TROPONINI in the last 168 hours. BNP (last 3 results) No results for input(s): PROBNP in the last 8760 hours. HbA1C: Recent Labs    10/09/19 0428  HGBA1C 7.0*   CBG: Recent Labs  Lab 10/06/19 1600 10/06/19 2147 10/07/19 0740 10/07/19 1057 10/07/19 1624  GLUCAP 107* 169* 143* 138* 135*   Lipid Profile: Recent Labs    10/09/19 0428  CHOL 145  HDL 36*  LDLCALC 93  TRIG 80  CHOLHDL 4.0   Thyroid Function Tests: No results for input(s): TSH, T4TOTAL, FREET4, T3FREE, THYROIDAB in the last 72 hours. Anemia Panel: No results for input(s): VITAMINB12, FOLATE, FERRITIN, TIBC, IRON, RETICCTPCT in the last 72 hours. Sepsis Labs: No results for input(s): PROCALCITON, LATICACIDVEN in the last 168 hours.  Recent Results (from the past 240 hour(s))  Urine culture     Status: Abnormal   Collection Time: 10/05/19  2:20 PM   Specimen: Urine, Random  Result Value Ref Range Status   Specimen Description URINE, RANDOM  Final   Special Requests   Final    NONE Performed at [facility], [address] [place], [address] [place]    Culture MULTIPLE SPECIES PRESENT, SUGGEST RECOLLECTION (A)  Final   Report Status 10/06/2019 FINAL  Final  SARS CORONAVIRUS 2 (TAT 6-24 HRS) Nasopharyngeal Nasopharyngeal Swab     Status: None   Collection Time: 10/05/19  7:27 PM   Specimen: Nasopharyngeal Swab  Result Value Ref Range Status   SARS Coronavirus 2 NEGATIVE NEGATIVE Final    Comment: (NOTE) SARS-CoV-2 target  nucleic acids are NOT DETECTED. The SARS-CoV-2 RNA is generally detectable in upper and lower respiratory specimens during the acute phase of infection. Negative results do not preclude SARS-CoV-2 infection, do not rule out co-infections with other pathogens, and should not be used as the sole basis for treatment or other patient management decisions. Negative results must be combined with clinical observations, patient history, and epidemiological information. The expected result is Negative. Fact Sheet for Patients: 12/06/2019 Fact Sheet for Healthcare Providers:  https://www.Donnalynn Wheeless-mathews.com/ This test is not yet approved or cleared by the [place] and  has been authorized for detection and/or diagnosis of SARS-CoV-2 by FDA under an Emergency Use Authorization (EUA). This EUA will remain  in effect (meaning this test can be used) for the duration of the COVID-19 declaration under Section 56 4(b)(1) of the Act, 21 U.S.C. section 360bbb-3(b)(1), unless the authorization is terminated or revoked sooner. Performed at [facility], [place] [address] [place], [place] [address]          Radiology Studies: No results found.      Scheduled Meds: . amLODipine  10 mg Oral QHS  . aspirin EC  81 mg Oral QHS  . carvedilol  12.5 mg Oral BID  . chlorthalidone  25 mg Oral Daily  . cholecalciferol  2,000 Units Oral Daily  . enoxaparin (LOVENOX) injection  30 mg Subcutaneous Q24H  . gabapentin  600 mg Oral QHS  . hydrALAZINE  100 mg Oral TID  . pantoprazole  40 mg Oral Daily  . pravastatin  40 mg Oral q1800  . triamcinolone cream  1 application Topical BID   Continuous Infusions: . dextrose 5 % and 0.9% NaCl 75 mL/hr at 10/09/19 1800     LOS: 4 days    Time spent:40 min    Morayma Godown, Geraldo Docker, MD [facility] Pager [phone]  If 7PM-7AM, please contact night-coverage [email or web address] Password [facility] 10/10/2019,  7:59 AM

## 2019-10-10 NOTE — Plan of Care (Signed)

## 2019-10-11 DIAGNOSIS — R0689 Other abnormalities of breathing: Secondary | ICD-10-CM | POA: Diagnosis not present

## 2019-10-11 DIAGNOSIS — E875 Hyperkalemia: Secondary | ICD-10-CM | POA: Diagnosis not present

## 2019-10-11 DIAGNOSIS — R41841 Cognitive communication deficit: Secondary | ICD-10-CM | POA: Diagnosis not present

## 2019-10-11 DIAGNOSIS — F0281 Dementia in other diseases classified elsewhere with behavioral disturbance: Secondary | ICD-10-CM | POA: Diagnosis not present

## 2019-10-11 DIAGNOSIS — I2510 Atherosclerotic heart disease of native coronary artery without angina pectoris: Secondary | ICD-10-CM | POA: Diagnosis not present

## 2019-10-11 DIAGNOSIS — R531 Weakness: Secondary | ICD-10-CM | POA: Diagnosis not present

## 2019-10-11 DIAGNOSIS — M6281 Muscle weakness (generalized): Secondary | ICD-10-CM | POA: Diagnosis not present

## 2019-10-11 DIAGNOSIS — E46 Unspecified protein-calorie malnutrition: Secondary | ICD-10-CM | POA: Diagnosis not present

## 2019-10-11 DIAGNOSIS — G309 Alzheimer's disease, unspecified: Secondary | ICD-10-CM | POA: Diagnosis not present

## 2019-10-11 DIAGNOSIS — R6251 Failure to thrive (child): Secondary | ICD-10-CM | POA: Diagnosis not present

## 2019-10-11 DIAGNOSIS — Z9181 History of falling: Secondary | ICD-10-CM | POA: Diagnosis not present

## 2019-10-11 DIAGNOSIS — I5031 Acute diastolic (congestive) heart failure: Secondary | ICD-10-CM | POA: Diagnosis not present

## 2019-10-11 DIAGNOSIS — R627 Adult failure to thrive: Secondary | ICD-10-CM | POA: Diagnosis not present

## 2019-10-11 DIAGNOSIS — Z7401 Bed confinement status: Secondary | ICD-10-CM | POA: Diagnosis not present

## 2019-10-11 DIAGNOSIS — E8809 Other disorders of plasma-protein metabolism, not elsewhere classified: Secondary | ICD-10-CM | POA: Diagnosis not present

## 2019-10-11 DIAGNOSIS — M25559 Pain in unspecified hip: Secondary | ICD-10-CM | POA: Diagnosis not present

## 2019-10-11 DIAGNOSIS — I959 Hypotension, unspecified: Secondary | ICD-10-CM | POA: Diagnosis not present

## 2019-10-11 DIAGNOSIS — N179 Acute kidney failure, unspecified: Secondary | ICD-10-CM

## 2019-10-11 DIAGNOSIS — R296 Repeated falls: Secondary | ICD-10-CM | POA: Diagnosis not present

## 2019-10-11 DIAGNOSIS — R4182 Altered mental status, unspecified: Secondary | ICD-10-CM | POA: Diagnosis not present

## 2019-10-11 DIAGNOSIS — E7849 Other hyperlipidemia: Secondary | ICD-10-CM | POA: Diagnosis not present

## 2019-10-11 DIAGNOSIS — N1832 Chronic kidney disease, stage 3b: Secondary | ICD-10-CM

## 2019-10-11 DIAGNOSIS — M6389 Disorders of muscle in diseases classified elsewhere, multiple sites: Secondary | ICD-10-CM | POA: Diagnosis not present

## 2019-10-11 DIAGNOSIS — R278 Other lack of coordination: Secondary | ICD-10-CM | POA: Diagnosis not present

## 2019-10-11 DIAGNOSIS — F418 Other specified anxiety disorders: Secondary | ICD-10-CM | POA: Diagnosis not present

## 2019-10-11 DIAGNOSIS — I10 Essential (primary) hypertension: Secondary | ICD-10-CM | POA: Diagnosis not present

## 2019-10-11 DIAGNOSIS — M6259 Muscle wasting and atrophy, not elsewhere classified, multiple sites: Secondary | ICD-10-CM | POA: Diagnosis not present

## 2019-10-11 DIAGNOSIS — M2550 Pain in unspecified joint: Secondary | ICD-10-CM | POA: Diagnosis not present

## 2019-10-11 DIAGNOSIS — R1311 Dysphagia, oral phase: Secondary | ICD-10-CM | POA: Diagnosis not present

## 2019-10-11 DIAGNOSIS — E119 Type 2 diabetes mellitus without complications: Secondary | ICD-10-CM | POA: Diagnosis not present

## 2019-10-11 DIAGNOSIS — D649 Anemia, unspecified: Secondary | ICD-10-CM | POA: Diagnosis not present

## 2019-10-11 DIAGNOSIS — R400 Somnolence: Secondary | ICD-10-CM | POA: Diagnosis not present

## 2019-10-11 DIAGNOSIS — F419 Anxiety disorder, unspecified: Secondary | ICD-10-CM | POA: Diagnosis not present

## 2019-10-11 DIAGNOSIS — N390 Urinary tract infection, site not specified: Secondary | ICD-10-CM | POA: Diagnosis not present

## 2019-10-11 DIAGNOSIS — E872 Acidosis: Secondary | ICD-10-CM | POA: Diagnosis not present

## 2019-10-11 DIAGNOSIS — I25119 Atherosclerotic heart disease of native coronary artery with unspecified angina pectoris: Secondary | ICD-10-CM | POA: Diagnosis not present

## 2019-10-11 DIAGNOSIS — F4325 Adjustment disorder with mixed disturbance of emotions and conduct: Secondary | ICD-10-CM | POA: Diagnosis not present

## 2019-10-11 DIAGNOSIS — R2681 Unsteadiness on feet: Secondary | ICD-10-CM | POA: Diagnosis not present

## 2019-10-11 DIAGNOSIS — I214 Non-ST elevation (NSTEMI) myocardial infarction: Secondary | ICD-10-CM | POA: Diagnosis not present

## 2019-10-11 DIAGNOSIS — K219 Gastro-esophageal reflux disease without esophagitis: Secondary | ICD-10-CM | POA: Diagnosis not present

## 2019-10-11 LAB — COMPREHENSIVE METABOLIC PANEL
ALT: 11 U/L (ref 0–44)
AST: 16 U/L (ref 15–41)
Albumin: 2.6 g/dL — ABNORMAL LOW (ref 3.5–5.0)
Alkaline Phosphatase: 56 U/L (ref 38–126)
Anion gap: 9 (ref 5–15)
BUN: 18 mg/dL (ref 8–23)
CO2: 19 mmol/L — ABNORMAL LOW (ref 22–32)
Calcium: 7.9 mg/dL — ABNORMAL LOW (ref 8.9–10.3)
Chloride: 111 mmol/L (ref 98–111)
Creatinine, Ser: 1.57 mg/dL — ABNORMAL HIGH (ref 0.44–1.00)
GFR calc Af Amer: 33 mL/min — ABNORMAL LOW (ref >60)
GFR calc non Af Amer: 28 mL/min — ABNORMAL LOW (ref >60)
Glucose, Bld: 187 mg/dL — ABNORMAL HIGH (ref 70–99)
Potassium: 4.1 mmol/L (ref 3.5–5.1)
Sodium: 139 mmol/L (ref 135–145)
Total Bilirubin: 0.6 mg/dL (ref 0.3–1.2)
Total Protein: 5.4 g/dL — ABNORMAL LOW (ref 6.5–8.1)

## 2019-10-11 LAB — CBC
HCT: 30.6 % — ABNORMAL LOW (ref 36.0–46.0)
Hemoglobin: 9.5 g/dL — ABNORMAL LOW (ref 12.0–15.0)
MCH: 25.6 pg — ABNORMAL LOW (ref 26.0–34.0)
MCHC: 31 g/dL (ref 30.0–36.0)
MCV: 82.5 fL (ref 80.0–100.0)
Platelets: 140 10*3/uL — ABNORMAL LOW (ref 150–400)
RBC: 3.71 MIL/uL — ABNORMAL LOW (ref 3.87–5.11)
RDW: 16.3 % — ABNORMAL HIGH (ref 11.5–15.5)
WBC: 7.4 10*3/uL (ref 4.0–10.5)
nRBC: 0 % (ref 0.0–0.2)

## 2019-10-11 LAB — PHOSPHORUS: Phosphorus: 2.7 mg/dL (ref 2.5–4.6)

## 2019-10-11 LAB — SARS CORONAVIRUS 2 (TAT 6-24 HRS): SARS Coronavirus 2: NEGATIVE

## 2019-10-11 LAB — MAGNESIUM: Magnesium: 1.9 mg/dL (ref 1.7–2.4)

## 2019-10-11 MED ORDER — DEXTROSE 5 % IV SOLN: 75.0000 mL | INTRAVENOUS | 0 refills | Status: DC

## 2019-10-11 MED ORDER — HYDRALAZINE HCL 100 MG PO TABS: 100.0000 mg | ORAL_TABLET | Freq: Three times a day (TID) | ORAL | 0 refills | Status: DC

## 2019-10-11 MED ORDER — DEXTROSE-NACL 5-0.45 % IV SOLN: 75.0000 mL | INTRAVENOUS | 0 refills | Status: DC

## 2019-10-11 MED ORDER — DICLOFENAC SODIUM 1 % EX GEL: 4.0000 g | Freq: Four times a day (QID) | CUTANEOUS | 0 refills | Status: DC

## 2019-10-11 MED ORDER — ACETAMINOPHEN 325 MG PO TABS: 650.0000 mg | ORAL_TABLET | ORAL | 0 refills | Status: DC | PRN

## 2019-10-11 NOTE — Progress Notes (Signed)
Physical Therapy Treatment Patient Details Name: [place] M Mcclinton MRN: [id number] DOB: 04/25/1926 Today's Date: 10/11/2019    History of Present Illness Pt is 86 yo female with PMH for dementia, CAD, CABG, DMII, HTN, HLD, ACS, and SBO who presented to ED with poor appetite and generalized weakness.    PT Comments    Pt more alert and increased ability to follow commands and process. Pt requiring minA for transfers but modA for standing balance. Pt remains at high falls risk and remains disoriented at baseline. Pt not at a level that the husband can care for her safely however aware that if patient goes to SNF he can't see her and her mentation will spiral. He strongly wants to take her home. We discussed possible options of getting temporary help via family, friends, or hired help until pt was stronger. Acute PT to cont to follow.  Follow Up Recommendations  SNF;Other (comment)(could go home with 24/7 assist provided by someone other than her husband and HHPT)     Equipment Recommendations  None recommended by PT    Recommendations for Other Services       Precautions / Restrictions Precautions Precautions: Fall Precaution Comments: cognition Restrictions Weight Bearing Restrictions: No    Mobility  Bed Mobility Overal bed mobility: Needs Assistance Bed Mobility: Supine to Sit     Supine to sit: Min assist     General bed mobility comments: minA for trunk elevation and to scooot forward, able to bring LEs off EOB  Transfers Overall transfer level: Needs assistance Equipment used: Rolling walker (2 wheeled) Transfers: Sit to/from Stand Sit to Stand: Min assist;+2 safety/equipment;Mod assist         General transfer comment: pt mod A x2 from EOB to power up but only minA to power up from chairx2 with verbal cues for safe hand placement , increased time as well  Ambulation/Gait Ambulation/Gait assistance: Mod assist Gait Distance (Feet): 10 Feet(x2, to/from  door) Assistive device: Rolling walker (2 wheeled) Gait Pattern/deviations: Decreased stride length;Staggering left;Staggering right;Shuffle;Narrow base of support Gait velocity: decreased Gait velocity interpretation: <1.8 ft/sec, indicate of risk for recurrent falls General Gait Details: pt with shuffling gait pattern in room with minA for RW management and directional verbal cues, pt with progressive trunk flexion and minimal step height   Stairs             Wheelchair Mobility    Modified Rankin (Stroke Patients Only)       Balance Overall balance assessment: Needs assistance Sitting-balance support: No upper extremity supported;Feet supported Sitting balance-Leahy Scale: Fair     Standing balance support: Single extremity supported Standing balance-Leahy Scale: Poor Standing balance comment: pt stood at sink to complete ADLs with OT and patient required modA from PT to maintain balance while combing hair, due to significant posterior lean, pt requiring minA while brushing teeth and washing face as pt leaning forward over sink. pt also with noted creeking at R hip/knee joint                            Cognition Arousal/Alertness: Awake/alert Behavior During Therapy: Flat affect Overall Cognitive Status: History of cognitive impairments - at baseline                                 General Comments: patient pleasant and cooperative, following commands with increased time.  Exercises      General Comments General comments (skin integrity, edema, etc.): VSS, spouse present today and discussed d/c recommendations, he wants to take her home however knows he can't do it all      Pertinent Vitals/Pain Pain Assessment: Faces Faces Pain Scale: Hurts a little bit Pain Location: generalized, arthitic joint pain in knees Pain Descriptors / Indicators: Discomfort Pain Intervention(s): Monitored during session    Home Living                       Prior Function            PT Goals (current goals can now be found in the care plan section) Acute Rehab PT Goals Patient Stated Goal: none stated Progress towards PT goals: Progressing toward goals    Frequency    Min 3X/week      PT Plan Current plan remains appropriate    Co-evaluation PT/OT/SLP Co-Evaluation/Treatment: Yes Reason for Co-Treatment: Necessary to address cognition/behavior during functional activity PT goals addressed during session: Mobility/safety with mobility OT goals addressed during session: ADL's and self-care      AM-PAC PT "6 Clicks" Mobility   Outcome Measure  Help needed turning from your back to your side while in a flat bed without using bedrails?: A Little Help needed moving from lying on your back to sitting on the side of a flat bed without using bedrails?: A Little Help needed moving to and from a bed to a chair (including a wheelchair)?: A Little Help needed standing up from a chair using your arms (e.g., wheelchair or bedside chair)?: A Little Help needed to walk in hospital room?: A Lot Help needed climbing 3-5 steps with a railing? : A Lot 6 Click Score: 16    End of Session Equipment Utilized During Treatment: Gait belt Activity Tolerance: Patient limited by fatigue;Patient limited by lethargy;Patient tolerated treatment well Patient left: with call bell/phone within reach;in chair;with chair alarm set;with family/visitor present Nurse Communication: Mobility status PT Visit Diagnosis: Muscle weakness (generalized) (M62.81);Unsteadiness on feet (R26.81)     Time: 1126-1206 PT Time Calculation (min) (ACUTE ONLY): 40 min  Charges:  $Gait Training: 8-22 mins                     Sonya Chavez, PT, DPT Acute Rehabilitation Services Pager #: [phone] Office #: [phone]    Sonya Chavez 10/11/2019, 2:04 PM

## 2019-10-11 NOTE — Progress Notes (Signed)
Report called to [facility]  [phone]) Rehabilitation receiving Nurse Elyn Peers, LPN. Questions answered and no concerns verbalized. Spouse a the bedside and aware of discharge to SNF. Awaiting PTAR to transfer out. Mikal Plane, BSN, RN

## 2019-10-11 NOTE — Plan of Care (Signed)

## 2019-10-11 NOTE — TOC Progression Note (Signed)
Transition of Care [facility]) - Progression Note    Patient Details  Name: Sonya Chavez MRN: [id number] Date of Birth: [birth year]  [facility]) CM/SW Contact  Nonda Lou, [place] Phone Number: 10/11/2019, 4:33 PM  Clinical Narrative:    CSW received updated PASRR. CSW contacted [employer] and spoke with French Ana [phone] and confirmed patient is able to discharge to facility. Currently waiting on Covid results. CSW will continue to follow & assist with discharge planning needs.   Expected Discharge Plan: Skilled Nursing Facility Barriers to Discharge: Continued Medical Work up  Expected Discharge Plan and Services Expected Discharge Plan: Skilled Nursing Facility     Post Acute Care Choice: Skilled Nursing Facility Living arrangements for the past 2 months: Single Family Home                                       Social Determinants of Health (SDOH) Interventions    Readmission Risk Interventions No flowsheet data found.

## 2019-10-11 NOTE — Progress Notes (Signed)
Occupational Therapy Treatment Patient Details Name: Sonya Chavez MRN: [id number] DOB: 20-Feb-1926 Today's Date: 10/11/2019    History of present illness Pt is 86 yo female with PMH for dementia, CAD, CABG, DMII, HTN, HLD, ACS, and SBO who presented to ED with poor appetite and generalized weakness.   OT comments  Patient supine in bed and agreeable to OT/PT session.  Patients spouse present and supportive.  Completing bed mobility with min assist, transfers with min assist +2 safety, in room mobility with min assist +2 safety; requires cueing throughout session for hand placement, sequencing, anterior lean (posterior bias). LB ADLs with max assist, grooming at sink min assist to sequence/problem solve and mod-max assist to maintain balance due to posterior lean.  Believe patient with best benefit from dc home due to cognition, discussed with spouse.  Continue to recommend SNF, unless spouse can find increased assist for home support. Will follow acutely.    Follow Up Recommendations  SNF;Supervision/Assistance - 24 hour(if can get increased support at home [facility] )    Equipment Recommendations  Other (comment)(TBD)    Recommendations for Other Services      Precautions / Restrictions Precautions Precautions: Fall       Mobility Bed Mobility Overal bed mobility: Needs Assistance Bed Mobility: Supine to Sit     Supine to sit: Min assist     General bed mobility comments: min assist to scoot forward, increased time and effort   Transfers Overall transfer level: Needs assistance Equipment used: Rolling walker (2 wheeled) Transfers: Sit to/from Stand Sit to Stand: Min assist;+2 safety/equipment         General transfer comment: min assist to power up and steady, cueing for hand placement and safety    Balance Overall balance assessment: Needs assistance Sitting-balance support: No upper extremity supported;Feet supported Sitting balance-Leahy Scale: Fair      Standing balance support: Bilateral upper extremity supported;No upper extremity supported;During functional activity Standing balance-Leahy Scale: Poor Standing balance comment: relaint on BUE and external support dynamically, posterior bias with static and dynamic balance                            ADL either performed or assessed with clinical judgement   ADL Overall ADL's : Needs assistance/impaired     Grooming: Wash/dry face;Brushing hair;Oral care;Standing;Minimal assistance Grooming Details (indicate cue type and reason): min assist to sequence and problem solve, up to mod approaching max assist for standing balance due to posterior bias              Lower Body Dressing: Maximal assistance;Sit to/from stand Lower Body Dressing Details (indicate cue type and reason): assist for socks, min assist sit to stand              Functional mobility during ADLs: Minimal assistance;Rolling walker;+2 for safety/equipment General ADL Comments: patient with improved sitting balance, following commands to sequence transfers. Continues to present with posterior lean with standing activities       Vision       Perception     Praxis      Cognition Arousal/Alertness: Awake/alert Behavior During Therapy: Flat affect Overall Cognitive Status: History of cognitive impairments - at baseline                                 General Comments: patient pleasant and cooperative, following commands with increased time.  Exercises     Shoulder Instructions       General Comments VSS, spouse present and supportive     Pertinent Vitals/ Pain       Pain Assessment: Faces Faces Pain Scale: Hurts a little bit Pain Location: generalized, arthitic joint pain  Pain Descriptors / Indicators: Discomfort Pain Intervention(s): Monitored during session;Repositioned  Home Living                                          Prior  Functioning/Environment              Frequency  Min 2X/week        Progress Toward Goals  OT Goals(current goals can now be found in the care plan section)  Progress towards OT goals: Progressing toward goals     Plan Discharge plan remains appropriate;Frequency remains appropriate    Co-evaluation    PT/OT/SLP Co-Evaluation/Treatment: Yes Reason for Co-Treatment: Necessary to address cognition/behavior during functional activity;For patient/therapist safety;To address functional/ADL transfers   OT goals addressed during session: ADL's and self-care      AM-PAC OT "6 Clicks" Daily Activity     Outcome Measure   Help from another person eating meals?: A Little Help from another person taking care of personal grooming?: A Little Help from another person toileting, which includes using toliet, bedpan, or urinal?: A Lot Help from another person bathing (including washing, rinsing, drying)?: A Lot Help from another person to put on and taking off regular upper body clothing?: A Little Help from another person to put on and taking off regular lower body clothing?: A Lot 6 Click Score: 15    End of Session Equipment Utilized During Treatment: Gait belt;Rolling walker  OT Visit Diagnosis: Unsteadiness on feet (R26.81);Muscle weakness (generalized) (M62.81);Other abnormalities of gait and mobility (R26.89)   Activity Tolerance Patient tolerated treatment well   Patient Left in chair;with call bell/phone within reach;with chair alarm set;with family/visitor present   Nurse Communication Mobility status        Time: 8295-6213 OT Time Calculation (min): 36 min  Charges: OT General Charges $OT Visit: 1 Visit OT Treatments $Self Care/Home Management : 8-22 mins  Echo Pager [phone] Office [phone]    Delight Stare 10/11/2019, 1:35 PM

## 2019-10-11 NOTE — Discharge Summary (Signed)
Physician Discharge Summary  Sonya [place] M Stoermer GYI:[id number] DOB: 09/27/1925 DOA: 10/05/2019  PCP: Alysia Penna, MD  Admit date: 10/05/2019 Discharge date: 10/11/2019  Time spent: 30 minutes  Recommendations for Outpatient Follow-up:   Generalized weakness/failure to thrive -Out of bed to chair q shift -Ambulate q shift -Husband believes patient needs SNF, or some other sort of palliative care program given her deteriorating health, patient stating I do not want to go to SNF.  Dementia -Windy Fast (husband) confirms patient has been having increasing memory issues. -A/O x2 today -3/7 Haldol 0.5 mg; will need to be very judicious patient has QT prolongation see below  NSTEMI/Elevated troponin Results for Buccheri, Sonya [place] M (MRN [id number]) as of 10/11/2019 17:52  Ref. Range 10/05/2019 14:00 10/05/2019 16:43 10/05/2019 23:13  Troponin I (High Sensitivity) Latest Ref Range: <18 ng/L 20 (H) 41 (H) 71 (H)  -Cardiology consulted Dr. Rosemary Holms -Per Dr. Rosemary Holms cardiology note 3/4; wall motion abnormality on echocardiogram is secondary to postoperative status and left bundle branch block.   NOT ACS presentation.  NO invasive work-up or management required.  He spoke with patient's husband, and conveyed his recommendations..   Acute diastolic CHF -Echocardiogram showed diastolic CHF see below -When you compare current echo to echo from 07/18/2019 appears to have worsening CHF. -Strict in and out +463ml -Daily weight Filed Weights   10/09/19 0508 10/10/19 0500 10/11/19 0558  Weight: 58.5 kg 60.3 kg 60.3 kg  -Amlodipine 10 mg daily -Coreg 12.5 mg BID -3/7 Chlorthalidone 25 mg discontinue may be causing/contributing to acute renal failure.  Would not restart this medication -Hydralazine 100 mg TID -Hydralazine PRN -Gentle hydration D5-0.45 saline 45ml/hr  QT prolongation -3/7 QT> 500 places patient at high risk of torsades.  Must be judicious in use of QT prolonging  medication  Essential HTN -See CHF  HLD -Pravastatin 40 mg daily  Acute on CKD stage IIIb (baseline Cr 1.32) Recent Labs  Lab 10/07/19 0412 10/08/19 0331 10/09/19 0428 10/10/19 0327 10/11/19 0321  CREATININE 1.42* 1.55* 1.63* 1.81* 1.57*  -Continue to gently hydrate at SNF  Hypokalemia -Potassium goal> 4    Hypomagnesmia -Magnesium goal> 2       Discharge Diagnoses:  Principal Problem:   Weakness generalized Active Problems:   HTN (hypertension)   DM (diabetes mellitus) (HCC)   CAD (coronary artery disease)   CKD (chronic kidney disease), stage III   Elevated troponin   Failure to thrive (86)   DNR (do not resuscitate)   Alzheimer's dementia with behavioral disturbance (HCC)   Palliative care encounter   Discharge Condition: Stable  Diet recommendation: Heart healthy  Filed Weights   10/09/19 0508 10/10/19 0500 10/11/19 0558  Weight: 58.5 kg 60.3 kg 60.3 kg    History of present illness:  86 y.o.BF PMHx CAD, CABG 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 Patient was generally weak and debilitated. She is unable to give adequate history. History obtained over the phone with family member. She is however initially bedbound in the last few weeks. Her initial evaluation only shows escalating troponin which is positive. Not sure if this is new or old. There is concern for possible cardiac involvement. Patient's family member indicated history of partial DNR. He does not want intubation or CPR but patient is therefore being admitted with possible NSTEMI but will have observation. Ultimate goal is for placement in a skilled facility as she is failing to thrive  at home.Marland Kitchen   Hospital Course:  Worked up for NSTEMI.  Cardiology felt not ACS presentation.  No invasive work-up or management required.  Patient does have acute acute diastolic CHF.  Patient treated for acute  on CKD stage IIIb, gently hydrated and creatinine has trended down though not back to baseline as of yet.  Will need to be followed by SNF.  Stable for discharge.  Procedures: 3/3 Echocardiogram;LVEF=55%.-The left ventricle  demonstrates regional wall motion abnormalities. -Moderate left ventricular hypertrophy.  -Grade 1 diastolic dysfunction left ventricular diastolic    Antibiotics Anti-infectives (From admission, onward)   None       Discharge Exam: Vitals:   10/10/19 2035 10/11/19 0558 10/11/19 1532 10/11/19 1605  BP: (!) 134/40 (!) 109/49 (!) 108/38 (!) 107/43  Pulse: 76 (!) 57  63  Resp: (!) 22 20    Temp: 98.6 F (37 C) 98 F (36.7 C)  (!) 97.4 F (36.3 C)  TempSrc: Oral Oral  Oral  SpO2: 94% 95%  97%  Weight:  60.3 kg      General: A/O x2 (does not know where, why), no acute respiratory distress, cachectic Eyes: negative scleral hemorrhage, negative anisocoria, negative icterus ENT: Negative Runny nose, negative gingival bleeding, Neck:  Negative scars, masses, torticollis, lymphadenopathy, JVD Lungs: Clear to auscultation bilaterally without wheezes or crackles Cardiovascular: Regular rate and rhythm without murmur gallop or rub normal S1 and S2   Discharge Instructions   Allergies as of 10/11/2019      Reactions   Statins Other (See Comments)   Myalgias   Other    NO FOODS WITH SEEDS   Crestor [rosuvastatin] Other (See Comments)   Myalgias   Lipitor [atorvastatin] Other (See Comments)   myalgias   Zocor [simvastatin] Other (See Comments)   myalgias      Medication List    STOP taking these medications   chlorthalidone 25 MG tablet Commonly known as: HYGROTON   metFORMIN 500 MG tablet Commonly known as: GLUCOPHAGE     TAKE these medications   acetaminophen 325 MG tablet Commonly known as: TYLENOL Take 2 tablets (650 mg total) by mouth every 4 (four) hours as needed for headache or mild pain. What changed:   medication strength  how much  to take  when to take this  reasons to take this   alendronate 70 MG tablet Commonly known as: FOSAMAX Take 70 mg by mouth every Monday.   ALPRAZolam 0.5 MG tablet Commonly known as: XANAX Take 0.5 mg by mouth 3 (three) times daily as needed.   amLODipine 10 MG tablet Commonly known as: NORVASC Take 10 mg by mouth at bedtime.   aspirin EC 81 MG tablet Take 81 mg by mouth at bedtime.   carvedilol 6.25 MG tablet Commonly known as: COREG Take 12.5 mg by mouth 2 (two) times daily.   dextrose 5 % and 0.45% NaCl infusion Inject 75 mLs into the vein continuous.   diclofenac Sodium 1 % Gel Commonly known as: VOLTAREN Apply 4 g topically 4 (four) times daily.   gabapentin 300 MG capsule Commonly known as: NEURONTIN Take 600 mg by mouth at bedtime.   hydrALAZINE 100 MG tablet Commonly known as: APRESOLINE Take 1 tablet (100 mg total) by mouth 3 (three) times daily. What changed:   medication strength  how much to take   lovastatin 40 MG tablet Commonly known as: MEVACOR Take 1 tablet (40 mg total) by mouth every evening.   pantoprazole 40 MG tablet  Commonly known as: PROTONIX Take 40 mg by mouth daily.   triamcinolone cream 0.1 % Commonly known as: KENALOG Apply 1 application topically 2 (two) times daily.   Vitamin D3 50 MCG (2000 UT) Tabs Take 1 tablet by mouth daily.      Allergies  Allergen Reactions  . Statins Other (See Comments)    Myalgias   . Other     NO FOODS WITH SEEDS  . Crestor [Rosuvastatin] Other (See Comments)    Myalgias  . Lipitor [Atorvastatin] Other (See Comments)    myalgias  . Zocor [Simvastatin] Other (See Comments)    myalgias   Contact information for after-discharge care    Destination    [facility]erred [facility] .   Service: Skilled Nursing Contact information: [address] [place] [address] [phone]               The results of significant diagnostics from this  hospitalization (including imaging, microbiology, ancillary and laboratory) are listed below for reference.    Significant Diagnostic Studies: DG Chest 2 View  Result Date: 10/05/2019 CLINICAL DATA:  Shortness of breath. EXAM: CHEST - 2 VIEW COMPARISON:  July 16, 2019. FINDINGS: Stable cardiomediastinal silhouette. Atherosclerosis of thoracic aorta is noted. Sternotomy wires are noted. No pneumothorax or pleural effusion is noted. No acute pulmonary disease is noted. Bony thorax is unremarkable. IMPRESSION: No active cardiopulmonary disease. Aortic Atherosclerosis (ICD10-I70.0). Electronically Signed   By: Lupita Raider M.D.   On: 10/05/2019 14:49   CT Head Wo Contrast  Result Date: 09/20/2019 CLINICAL DATA:  Head trauma. Fell asleep in the kitchen while sitting in chair. Larey Seat out of chair and hit right-side of head. Small laceration on right-side of head. EXAM: CT HEAD WITHOUT CONTRAST TECHNIQUE: Contiguous axial images were obtained from the base of the skull through the vertex without intravenous contrast. COMPARISON:  07/17/2019 FINDINGS: Brain: No evidence of acute infarction, hemorrhage, hydrocephalus, extra-axial collection or mass lesion/mass effect. There is mild diffuse low-attenuation within the subcortical and periventricular white matter compatible with chronic microvascular disease. Vascular: No hyperdense vessel or unexpected calcification. Skull: Normal. Negative for fracture or focal lesion. Sinuses/Orbits: No acute finding. Other: None IMPRESSION: 1. No acute intracranial abnormalities. 2. Chronic small vessel ischemic change and brain atrophy. Electronically Signed   By: Signa Kell M.D.   On: 09/20/2019 12:19   ECHOCARDIOGRAM COMPLETE  Result Date: 10/06/2019    ECHOCARDIOGRAM REPORT   Patient Name:   Sonya Chavez Date of Exam: 10/06/2019 Medical Rec #:  [id number]              Height:       62.0 in Accession #:    [id number]             Weight:       120.0 lb Date of  Birth:  1926-07-25              BSA:          1.539 m Patient Age:    86 years               BP:           182/55 mmHg Patient Gender: F                      HR:           56 bpm. Exam Location:  Inpatient Procedure: 2D Echo, Cardiac Doppler and Color  Doppler Indications:    Chest Pain 786.50  History:        Patient has prior history of Echocardiogram examinations, most                 recent 07/18/2019. CAD, Arrythmias:LBBB; Risk                 Factors:Hypertension, Diabetes and Non-Smoker. PAD. Elevated                 troponin.  Sonographer:    Renella CunasJulia Swaim RDCS Referring Phys: [id number] MOHAMMAD L GARBA IMPRESSIONS  1. Left ventricular ejection fraction, by estimation, is 55%. The left ventricle has normal function. The left ventricle demonstrates regional wall motion abnormalities, septal-lateral dyssynchrony consistent with LBBB. There is moderate left ventricular hypertrophy. Left ventricular diastolic parameters are consistent with Grade I diastolic dysfunction (impaired relaxation).  2. Right ventricular systolic function is mildly reduced. The right ventricular size is normal. Tricuspid regurgitation signal is inadequate for assessing PA pressure.  3. The aortic valve is tricuspid. Aortic valve regurgitation is not visualized. Mild to moderate aortic valve sclerosis/calcification is present, without any evidence of aortic stenosis.  4. The mitral valve is normal in structure and function. No evidence of mitral valve regurgitation. No evidence of mitral stenosis.  5. The IVC was not visualized. FINDINGS  Left Ventricle: Left ventricular ejection fraction, by estimation, is 55%. The left ventricle has normal function. The left ventricle demonstrates regional wall motion abnormalities. The left ventricular internal cavity size was normal in size. There is  moderate left ventricular hypertrophy. Left ventricular diastolic parameters are consistent with Grade I diastolic dysfunction (impaired relaxation). Right  Ventricle: The right ventricular size is normal. No increase in right ventricular wall thickness. Right ventricular systolic function is mildly reduced. Tricuspid regurgitation signal is inadequate for assessing PA pressure. Left Atrium: Left atrial size was normal in size. Right Atrium: Right atrial size was normal in size. Pericardium: There is no evidence of pericardial effusion. Mitral Valve: The mitral valve is normal in structure and function. There is moderate calcification of the mitral valve leaflet(s). Moderate mitral annular calcification. No evidence of mitral valve regurgitation. No evidence of mitral valve stenosis. Tricuspid Valve: The tricuspid valve is normal in structure. Tricuspid valve regurgitation is not demonstrated. Aortic Valve: The aortic valve is tricuspid. Aortic valve regurgitation is not visualized. Mild to moderate aortic valve sclerosis/calcification is present, without any evidence of aortic stenosis. Pulmonic Valve: The pulmonic valve was normal in structure. Pulmonic valve regurgitation is not visualized. No evidence of pulmonic stenosis. Aorta: The aortic root is normal in size and structure. Venous: The inferior vena cava was not well visualized. IAS/Shunts: No atrial level shunt detected by color flow Doppler.  LEFT VENTRICLE PLAX 2D LVIDd:         3.50 cm     Diastology LVIDs:         2.70 cm     LV e' lateral:   3.55 cm/s LV PW:         0.90 cm     LV E/e' lateral: 16.9 LV IVS:        0.90 cm     LV e' medial:    2.98 cm/s                            LV E/e' medial:  20.1  LV Volumes (MOD) LV vol d, MOD A2C: 92.5 ml LV vol  d, MOD A4C: 76.0 ml LV vol s, MOD A2C: 48.9 ml LV vol s, MOD A4C: 44.4 ml LV SV MOD A2C:     43.6 ml LV SV MOD A4C:     76.0 ml LV SV MOD BP:      37.6 ml RIGHT VENTRICLE RV S prime:     5.96 cm/s TAPSE (M-mode): 1.1 cm LEFT ATRIUM             Index       RIGHT ATRIUM          Index LA diam:        2.80 cm 1.82 cm/m  RA Area:     8.47 cm LA Vol (A2C):    28.2 ml 18.33 ml/m RA Volume:   14.30 ml 9.29 ml/m LA Vol (A4C):   17.6 ml 11.44 ml/m LA Biplane Vol: 23.8 ml 15.47 ml/m  AORTIC VALVE LVOT Vmax:   74.70 cm/s LVOT Vmean:  49.900 cm/s LVOT VTI:    0.168 m  AORTA Ao Root diam: 2.60 cm MITRAL VALVE MV Area (PHT): 2.69 cm    SHUNTS MV Decel Time: 282 msec    Systemic VTI: 0.17 m MV E velocity: 59.90 cm/s MV A velocity: 91.70 cm/s MV E/A ratio:  0.65 Marca Ancona MD Electronically signed by Marca Ancona MD Signature Date/Time: 10/06/2019/5:36:27 PM    Final     Microbiology: Recent Results (from the past 240 hour(s))  Urine culture     Status: Abnormal   Collection Time: 10/05/19  2:20 PM   Specimen: Urine, Random  Result Value Ref Range Status   Specimen Description URINE, RANDOM  Final   Special Requests   Final    NONE Performed at [facility], [address], [place], [place] [address]    Culture MULTIPLE SPECIES PRESENT, SUGGEST RECOLLECTION (A)  Final   Report Status 10/06/2019 FINAL  Final  SARS CORONAVIRUS 2 (TAT 6-24 HRS) Nasopharyngeal Nasopharyngeal Swab     Status: None   Collection Time: 10/05/19  7:27 PM   Specimen: Nasopharyngeal Swab  Result Value Ref Range Status   SARS Coronavirus 2 NEGATIVE NEGATIVE Final    Comment: (NOTE) SARS-CoV-2 target nucleic acids are NOT DETECTED. The SARS-CoV-2 RNA is generally detectable in upper and lower respiratory specimens during the acute phase of infection. Negative results do not preclude SARS-CoV-2 infection, do not rule out co-infections with other pathogens, and should not be used as the sole basis for treatment or other patient management decisions. Negative results must be combined with clinical observations, patient history, and epidemiological information. The expected result is Negative. Fact Sheet for Patients: HairSlick.no Fact Sheet for Healthcare Providers: quierodirigir.com This test is not yet approved  or cleared by the [place] FDA and  has been authorized for detection and/or diagnosis of SARS-CoV-2 by FDA under an Emergency Use Authorization (EUA). This EUA will remain  in effect (meaning this test can be used) for the duration of the COVID-19 declaration under Section 56 4(b)(1) of the Act, 21 U.S.C. section 360bbb-3(b)(1), unless the authorization is terminated or revoked sooner. Performed at [facility], [address], [place], [place] [address]   SARS CORONAVIRUS 2 (TAT 6-24 HRS) Nasopharyngeal Nasopharyngeal Swab     Status: None   Collection Time: 10/11/19 12:08 PM   Specimen: Nasopharyngeal Swab  Result Value Ref Range Status   SARS Coronavirus 2 NEGATIVE NEGATIVE Final    Comment: (NOTE) SARS-CoV-2 target nucleic acids are  NOT DETECTED. The SARS-CoV-2 RNA is generally detectable in upper and lower respiratory specimens during the acute phase of infection. Negative results do not preclude SARS-CoV-2 infection, do not rule out co-infections with other pathogens, and should not be used as the sole basis for treatment or other patient management decisions. Negative results must be combined with clinical observations, patient history, and epidemiological information. The expected result is Negative. Fact Sheet for Patients: SugarRoll.be Fact Sheet for Healthcare Providers: https://www.Emmeline Winebarger-mathews.com/ This test is not yet approved or cleared by the [place] FDA and  has been authorized for detection and/or diagnosis of SARS-CoV-2 by FDA under an Emergency Use Authorization (EUA). This EUA will remain  in effect (meaning this test can be used) for the duration of the COVID-19 declaration under Section 56 4(b)(1) of the Act, 21 U.S.C. section 360bbb-3(b)(1), unless the authorization is terminated or revoked sooner. Performed at [facility], [place] [address], [place], [place] [address]      Labs: Basic  Metabolic Panel: Recent Labs  Lab 10/07/19 [phone] 10/08/19 0331 10/09/19 0428 10/10/19 0327 10/11/19 0321  NA 143 143 142 142 139  K 3.7 3.5 3.3* 4.6 4.1  CL 108 110 112* 114* 111  CO2 23 22 21* 20* 19*  GLUCOSE 129* 144* 154* 227* 187*  BUN 20 15 15 20 18   CREATININE 1.42* 1.55* 1.63* 1.81* 1.57*  CALCIUM 8.6* 8.1* 7.7* 8.0* 7.9*  MG  --   --  1.5* 2.2 1.9  PHOS  --   --  2.5 2.7 2.7   Liver Function Tests: Recent Labs  Lab 10/05/19 1400 10/09/19 0428 10/10/19 0327 10/11/19 0321  AST 16 14* 15 16  ALT 12 8 10 11   ALKPHOS 75 53 56 56  BILITOT 1.0 0.4 0.5 0.6  PROT 7.0 4.9* 5.2* 5.4*  ALBUMIN 3.7 2.5* 2.6* 2.6*   No results for input(s): LIPASE, AMYLASE in the last 168 hours. No results for input(s): AMMONIA in the last 168 hours. CBC: Recent Labs  Lab 10/05/19 1400 10/05/19 2313 10/06/19 1250 10/07/19 0412 10/09/19 0428 10/10/19 0327 10/11/19 0321  WBC 5.4   < > 6.7 5.6 6.1 6.3 7.4  NEUTROABS 3.3  --   --   --   --   --   --   HGB 12.4   < > 12.1 10.7* 9.2* 9.4* 9.5*  HCT 42.4   < > 39.7 34.6* 30.6* 31.1* 30.6*  MCV 85.7   < > 82.5 81.2 83.8 82.5 82.5  PLT 170   < > 172 162 141* 142* 140*   < > = values in this interval not displayed.   Cardiac Enzymes: No results for input(s): CKTOTAL, CKMB, CKMBINDEX, TROPONINI in the last 168 hours. BNP: BNP (last 3 results) Recent Labs    07/16/19 2240  BNP 278.7*    ProBNP (last 3 results) No results for input(s): PROBNP in the last 8760 hours.  CBG: Recent Labs  Lab 10/06/19 1600 10/06/19 2147 10/07/19 0740 10/07/19 1057 10/07/19 1624  GLUCAP 107* 169* 143* 138* 135*       Signed:  Dia Crawford, MD [facility] [phone] pager

## 2019-10-11 NOTE — TOC Transition Note (Signed)
Transition of Care [facility]) - CM/SW Discharge Note   Patient Details  Name: Sonya Chavez MRN: [id number] Date of Birth: 04/11/1926  Transition of [facility] CM/SW Contact:  Nonda Lou, LCSW Phone Number: 10/11/2019, 6:35 PM   Clinical Narrative:    Patient will DC to: [facility] Anticipated DC date: 10/11/19 Family notified: Spouse at bedside Transport by: Sharin Mons   Per MD patient ready for DC to [facility]. RN, patient, patient's family, and facility notified of DC. Discharge Summary and FL2 sent to facility. RN to call report prior to discharge [phone]). DC packet on chart. Ambulance transport requested for patient.   CSW will sign off for now as social work intervention is no longer needed. Please consult [place] again if new needs arise.    Final next level of care: Skilled Nursing Facility Barriers to Discharge: Continued Medical Work up   Patient Goals and CMS Choice Patient states their goals for this hospitalization and ongoing recovery are:: To return home CMS Medicare.gov Compare Post Acute Care list provided to:: Patient Represenative (must comment)(Ethel (sister) PH: [phone]) Choice offered to / list presented to : [name] (sister) [facility]: [phone])  Discharge Placement                       Discharge Plan and Services     Post Acute Care Choice: Skilled Nursing Facility                               Social Determinants of Health (SDOH) Interventions     Readmission Risk Interventions No flowsheet data found.

## 2019-10-11 NOTE — Plan of Care (Signed)
  Problem: Education: Goal: Knowledge of General Education information will improve Outcome: Adequate for Discharge  Problem: Clinical Measurements: Goal: Ability to maintain clinical measurements within normal limits will improve Outcome: Completed/Met Goal: Will remain free from infection Outcome: Completed/Met Goal: Diagnostic test results will improve Outcome: Completed/Met Goal: Cardiovascular complication will be avoided Outcome: Completed/Met   Problem: Activity: Goal: Risk for activity intolerance will decrease Outcome: Adequate for Discharge   Problem: Nutrition: Goal: Adequate nutrition will be maintained Outcome: Completed/Met   Problem: Coping: Goal: Level of anxiety will decrease Outcome: Completed/Met   Problem: Elimination: Goal: Will not experience complications related to bowel motility Outcome: Completed/Met Goal: Will not experience complications related to urinary retention Outcome: Completed/Met   Problem: Skin Integrity: Goal: Risk for impaired skin integrity will decrease Outcome: Completed/Met   Problem: Safety: Goal: Ability to remain free from injury will improve Outcome: Completed/Met  Patient being discharged to [facility] with Palliative involvement.

## 2019-10-12 ENCOUNTER — Ambulatory Visit: Payer: PRIVATE HEALTH INSURANCE | Admitting: Cardiology

## 2019-10-13 DIAGNOSIS — E872 Acidosis: Secondary | ICD-10-CM | POA: Diagnosis not present

## 2019-10-13 DIAGNOSIS — G309 Alzheimer's disease, unspecified: Secondary | ICD-10-CM | POA: Diagnosis not present

## 2019-10-13 DIAGNOSIS — M25559 Pain in unspecified hip: Secondary | ICD-10-CM | POA: Diagnosis not present

## 2019-10-13 DIAGNOSIS — F0281 Dementia in other diseases classified elsewhere with behavioral disturbance: Secondary | ICD-10-CM | POA: Diagnosis not present

## 2019-10-14 DIAGNOSIS — R627 Adult failure to thrive: Secondary | ICD-10-CM | POA: Diagnosis not present

## 2019-10-14 DIAGNOSIS — N1832 Chronic kidney disease, stage 3b: Secondary | ICD-10-CM | POA: Diagnosis not present

## 2019-10-14 DIAGNOSIS — N179 Acute kidney failure, unspecified: Secondary | ICD-10-CM | POA: Diagnosis not present

## 2019-10-14 DIAGNOSIS — E8809 Other disorders of plasma-protein metabolism, not elsewhere classified: Secondary | ICD-10-CM | POA: Diagnosis not present

## 2019-10-15 DIAGNOSIS — E872 Acidosis: Secondary | ICD-10-CM | POA: Diagnosis not present

## 2019-10-15 DIAGNOSIS — G309 Alzheimer's disease, unspecified: Secondary | ICD-10-CM | POA: Diagnosis not present

## 2019-10-15 DIAGNOSIS — F0281 Dementia in other diseases classified elsewhere with behavioral disturbance: Secondary | ICD-10-CM | POA: Diagnosis not present

## 2019-10-15 DIAGNOSIS — E875 Hyperkalemia: Secondary | ICD-10-CM | POA: Diagnosis not present

## 2019-10-19 DIAGNOSIS — N1832 Chronic kidney disease, stage 3b: Secondary | ICD-10-CM | POA: Diagnosis not present

## 2019-10-19 DIAGNOSIS — N179 Acute kidney failure, unspecified: Secondary | ICD-10-CM | POA: Diagnosis not present

## 2019-10-19 DIAGNOSIS — E872 Acidosis: Secondary | ICD-10-CM | POA: Diagnosis not present

## 2019-10-22 ENCOUNTER — Other Ambulatory Visit: Payer: Self-pay | Admitting: *Deleted

## 2019-10-22 DIAGNOSIS — N1832 Chronic kidney disease, stage 3b: Secondary | ICD-10-CM | POA: Diagnosis not present

## 2019-10-22 DIAGNOSIS — N179 Acute kidney failure, unspecified: Secondary | ICD-10-CM | POA: Diagnosis not present

## 2019-10-22 DIAGNOSIS — D649 Anemia, unspecified: Secondary | ICD-10-CM | POA: Diagnosis not present

## 2019-10-22 DIAGNOSIS — E875 Hyperkalemia: Secondary | ICD-10-CM | POA: Diagnosis not present

## 2019-10-22 NOTE — Patient Outreach (Addendum)
Member screened for potential [facility] Care Management needs as a benefit of [employer] Medicare.  Sonya Chavez is currently receiving therapy at [facility]. Collaboration with [facility] RN. Writer made aware that facility has been communicating with member's son Sonya Chavez regarding disposition plans and needs. Facility reportedly has scheduled meeting with family next Friday.   Telephone call made to Sonya Chavez (son) [phone]. Voicemail message said Sonya Chavez. HIPAA compliant voicemail message left to request call back. Will plan to obtain the correct number for Sonya Chavez.   Telephone call to Sonya Chavez to speak with facility SW. She is off today. Back on Wednesday.   Will continue to follow for transition plans while member resides in SNF.  Addendum: 1700pm  Telephone call from Sonya Chavez (son) [phone]. Patient identifiers confirmed. Sonya Chavez states he and his wife Sonya Chavez have the same number. Sonya Chavez states he is not sure disposition plan as of yet. However, he reports that his stepfather is unable to assist member at home. States member's home is old and is not conducive for her and has narrow doorways and inadequate bathroom. Sonya Chavez states member is not mobile right now. Sonya Chavez appears to be open to long term care. Sonya Chavez states they have a care plan meeting with facility next Friday. Sonya Chavez agreeable to Clinical research associate emailing him Medicaid information for planning purposes. Advised that facility will be the experts in long term care process but writer can assist in his preparation. Both son and daughter in law express appreciation of writer's call.   [name], MSN-Ed, RN,BSN [facility] Post Acute Care Coordinator [phone] [facility]) [phone]  (Toll free office)

## 2019-10-28 ENCOUNTER — Other Ambulatory Visit: Payer: Self-pay | Admitting: *Deleted

## 2019-10-28 NOTE — Patient Outreach (Signed)
Screened for potential [facility] Care Management needs as a benefit of  [employer] Medicare.  Mrs. Wingler is currently receiving skilled therapy at [facility].   Writer attended telephonic interdisciplinary team meeting to assess for disposition needs and transition plan for resident.   Facility states the business office usually assists with Medicaid application process. Discussed that Clinical research associate has sent member's son information on what items to start gathering for Medicaid as well.  Facility plans to to have care plan meeting with son to discuss disposition plans. Writer will plan outreach to son again at later time.   Will continue to follow for transition plans while member resides in SNF.   [name], MSN-Ed, RN,BSN [facility] Post Acute Care Coordinator [phone] [facility]) [phone]  (Toll free office)

## 2019-10-29 DIAGNOSIS — R400 Somnolence: Secondary | ICD-10-CM | POA: Diagnosis not present

## 2019-10-29 DIAGNOSIS — R4182 Altered mental status, unspecified: Secondary | ICD-10-CM | POA: Diagnosis not present

## 2019-11-01 DIAGNOSIS — F4325 Adjustment disorder with mixed disturbance of emotions and conduct: Secondary | ICD-10-CM | POA: Diagnosis not present

## 2019-11-01 DIAGNOSIS — F0281 Dementia in other diseases classified elsewhere with behavioral disturbance: Secondary | ICD-10-CM | POA: Diagnosis not present

## 2019-11-01 DIAGNOSIS — G309 Alzheimer's disease, unspecified: Secondary | ICD-10-CM | POA: Diagnosis not present

## 2019-11-01 DIAGNOSIS — F419 Anxiety disorder, unspecified: Secondary | ICD-10-CM | POA: Diagnosis not present

## 2019-11-02 DIAGNOSIS — R296 Repeated falls: Secondary | ICD-10-CM | POA: Diagnosis not present

## 2019-11-02 DIAGNOSIS — R4182 Altered mental status, unspecified: Secondary | ICD-10-CM | POA: Diagnosis not present

## 2019-11-02 DIAGNOSIS — D649 Anemia, unspecified: Secondary | ICD-10-CM | POA: Diagnosis not present

## 2019-11-02 DIAGNOSIS — N390 Urinary tract infection, site not specified: Secondary | ICD-10-CM | POA: Diagnosis not present

## 2019-11-04 ENCOUNTER — Other Ambulatory Visit: Payer: Self-pay | Admitting: *Deleted

## 2019-11-04 NOTE — Patient Outreach (Signed)
[facility] Post- Acute Care Coordinator follow up.  Mrs. Sonya Chavez remains at [facility].   Telephone call made to member's son Sonya Chavez (son) [phone]. Spoke with daughter in law Sonya Chavez. Patient identifiers confirmed.   Sonya Chavez states she just got off the phone with the SW at [facility] discussing transition plans. Member is too high functioning for LTC. States [employer] SW is supposed assist them with submitting Medicaid application. Sonya Chavez reports member remains confused. States member's husband is not able to assist member despite him really wanting member to return home. Sonya Chavez and member's son Sonya Chavez would like member to go to ALF. However, not sure if member will be able to go to ALF due to finances. Expressed concern for member returning home with husband.   Discussed alternative plans if member doesn't go to ALF. Encouraged Sonya Chavez to consider paid caregiver assistance if member goes home. Sonya Chavez states facility SW is providing them a list.   Discussed [facility] Care Management follow up with [facility] RNCM and THN LCSW and [facility] services. Sonya Chavez is agreeable to [facility] Care Management and [facility] services in case member returns home. Writer to send [facility] and Tree surgeon and website address via email.  Discussed writer will continue to follow and plan appropriate referrals if member returns home.     [name], MSN-Ed, RN,BSN [facility] Post Acute Care Coordinator [phone] [facility]) [phone]  (Toll free office)

## 2019-11-08 DIAGNOSIS — R627 Adult failure to thrive: Secondary | ICD-10-CM | POA: Diagnosis not present

## 2019-11-08 DIAGNOSIS — N179 Acute kidney failure, unspecified: Secondary | ICD-10-CM | POA: Diagnosis not present

## 2019-11-08 DIAGNOSIS — E872 Acidosis: Secondary | ICD-10-CM | POA: Diagnosis not present

## 2019-11-08 DIAGNOSIS — E875 Hyperkalemia: Secondary | ICD-10-CM | POA: Diagnosis not present

## 2019-11-11 ENCOUNTER — Other Ambulatory Visit: Payer: Self-pay | Admitting: *Deleted

## 2019-11-11 NOTE — Patient Outreach (Signed)
Screened for potential [facility] Care Management needs as a benefit of  [employer] A[employer] Medicare.  Mrs. Starliper is currently receiving skilled therapy at [facility].   Writer attended telephonic interdisciplinary team meeting to assess for disposition needs and transition plan for resident.   Facility reports transition plan is now for long term care at [facility].   No identifiable [facility]N needs assessed at this time.    [name], MSN-Ed, RN,BSN [facility] Post Acute Care Coordinator [phone] [facility]) [phone]  (Toll free office)

## 2019-11-12 DIAGNOSIS — R278 Other lack of coordination: Secondary | ICD-10-CM | POA: Diagnosis not present

## 2019-11-12 DIAGNOSIS — M6389 Disorders of muscle in diseases classified elsewhere, multiple sites: Secondary | ICD-10-CM | POA: Diagnosis not present

## 2019-11-12 DIAGNOSIS — E7849 Other hyperlipidemia: Secondary | ICD-10-CM | POA: Diagnosis not present

## 2019-11-12 DIAGNOSIS — I25119 Atherosclerotic heart disease of native coronary artery with unspecified angina pectoris: Secondary | ICD-10-CM | POA: Diagnosis not present

## 2019-11-12 DIAGNOSIS — R630 Anorexia: Secondary | ICD-10-CM | POA: Diagnosis not present

## 2019-11-12 DIAGNOSIS — E8809 Other disorders of plasma-protein metabolism, not elsewhere classified: Secondary | ICD-10-CM | POA: Diagnosis not present

## 2019-11-12 DIAGNOSIS — M6259 Muscle wasting and atrophy, not elsewhere classified, multiple sites: Secondary | ICD-10-CM | POA: Diagnosis not present

## 2019-11-12 DIAGNOSIS — R627 Adult failure to thrive: Secondary | ICD-10-CM | POA: Diagnosis not present

## 2019-11-12 DIAGNOSIS — D649 Anemia, unspecified: Secondary | ICD-10-CM | POA: Diagnosis not present

## 2019-11-12 DIAGNOSIS — E119 Type 2 diabetes mellitus without complications: Secondary | ICD-10-CM | POA: Diagnosis not present

## 2019-11-12 DIAGNOSIS — G309 Alzheimer's disease, unspecified: Secondary | ICD-10-CM | POA: Diagnosis not present

## 2019-11-12 DIAGNOSIS — R41841 Cognitive communication deficit: Secondary | ICD-10-CM | POA: Diagnosis not present

## 2019-11-12 DIAGNOSIS — R2681 Unsteadiness on feet: Secondary | ICD-10-CM | POA: Diagnosis not present

## 2019-11-12 DIAGNOSIS — Z9181 History of falling: Secondary | ICD-10-CM | POA: Diagnosis not present

## 2019-11-12 DIAGNOSIS — I10 Essential (primary) hypertension: Secondary | ICD-10-CM | POA: Diagnosis not present

## 2019-11-12 DIAGNOSIS — N1832 Chronic kidney disease, stage 3b: Secondary | ICD-10-CM | POA: Diagnosis not present

## 2019-11-12 DIAGNOSIS — F418 Other specified anxiety disorders: Secondary | ICD-10-CM | POA: Diagnosis not present

## 2019-11-12 DIAGNOSIS — I5031 Acute diastolic (congestive) heart failure: Secondary | ICD-10-CM | POA: Diagnosis not present

## 2019-11-12 DIAGNOSIS — F0281 Dementia in other diseases classified elsewhere with behavioral disturbance: Secondary | ICD-10-CM | POA: Diagnosis not present

## 2019-11-12 DIAGNOSIS — K219 Gastro-esophageal reflux disease without esophagitis: Secondary | ICD-10-CM | POA: Diagnosis not present

## 2019-11-12 DIAGNOSIS — N179 Acute kidney failure, unspecified: Secondary | ICD-10-CM | POA: Diagnosis not present

## 2019-11-12 DIAGNOSIS — E46 Unspecified protein-calorie malnutrition: Secondary | ICD-10-CM | POA: Diagnosis not present

## 2019-11-12 DIAGNOSIS — M6281 Muscle weakness (generalized): Secondary | ICD-10-CM | POA: Diagnosis not present

## 2019-11-12 DIAGNOSIS — R1311 Dysphagia, oral phase: Secondary | ICD-10-CM | POA: Diagnosis not present

## 2019-11-15 ENCOUNTER — Other Ambulatory Visit: Payer: Self-pay | Admitting: *Deleted

## 2019-11-15 DIAGNOSIS — F419 Anxiety disorder, unspecified: Secondary | ICD-10-CM | POA: Diagnosis not present

## 2019-11-15 DIAGNOSIS — D649 Anemia, unspecified: Secondary | ICD-10-CM | POA: Diagnosis not present

## 2019-11-15 DIAGNOSIS — F0281 Dementia in other diseases classified elsewhere with behavioral disturbance: Secondary | ICD-10-CM | POA: Diagnosis not present

## 2019-11-15 DIAGNOSIS — F4325 Adjustment disorder with mixed disturbance of emotions and conduct: Secondary | ICD-10-CM | POA: Diagnosis not present

## 2019-11-15 DIAGNOSIS — R627 Adult failure to thrive: Secondary | ICD-10-CM | POA: Diagnosis not present

## 2019-11-15 DIAGNOSIS — N1832 Chronic kidney disease, stage 3b: Secondary | ICD-10-CM | POA: Diagnosis not present

## 2019-11-15 DIAGNOSIS — G309 Alzheimer's disease, unspecified: Secondary | ICD-10-CM | POA: Diagnosis not present

## 2019-11-15 NOTE — Patient Outreach (Addendum)
[facility] Post-Acute Care Coordinator follow up  Sonya Chavez is currently receiving skilled therapy at [facility]. Facility indicated last week during IDT that the transition plan was for LTC.  Spoke with [name] (married to son [name]) [phone]. [name] reports member will discharge from [facility] this week. States she has been told that member is doing too well for long term care. States Medicaid has been applied for and is pending. [name] unsure if Medicaid is for community or facility.   [name] inquires about what [employer] can offer. Explained [facility] Care Management services again for [name] RNCM for care coordination and complex case management and THN LCSW for meals, transportation, and long term care planning needs and discussion. [facility] home visits.   [name] affirms that member's husband gets confused and overwhelmed. Discussed that [name] will be contacted via three way call with [name] to discuss [facility] services and follow up.  Emphasized to [name] the importance of member having additional assistance in the home. Discussed that [name] is high fall risk. [name] endorses that the husband is unable to assist but is adamant that [name] returns home. Member remains confused. Also discussed that member could benefit from being in Memory Care ALF rather than home.   [name] states she is supposed to speak with facility SW today about discharge details. She will communicate with Clinical research associate afterwards.   [name] states she and her husband [name] will not come until they are fully vaccinated which will be next month.   Will plan to make referral to [facility]. Will inquire with [facility] CM Asst. Director about [facility] referral. Will make referral to [facility] RNCM and THN LCSW upon SNF dc.   APS referral made. Spoke with [facility]. Referral made due to member being unable to care for self and elderly husband unable to assist. High risk for falls and injury. Has poor  cognition. Per chart records, APS report was filed during last hospitalization. However, member transitioned to SNF at that time.  Will continue to follow.   [name], MSN-Ed, RN,BSN [facility] Post Acute Care Coordinator [phone] [name]) [phone]  (Toll free office)

## 2019-11-16 DIAGNOSIS — R630 Anorexia: Secondary | ICD-10-CM | POA: Diagnosis not present

## 2019-11-16 DIAGNOSIS — D649 Anemia, unspecified: Secondary | ICD-10-CM | POA: Diagnosis not present

## 2019-11-16 DIAGNOSIS — E872 Acidosis: Secondary | ICD-10-CM | POA: Diagnosis not present

## 2019-11-16 DIAGNOSIS — Z20822 Contact with and (suspected) exposure to covid-19: Secondary | ICD-10-CM | POA: Diagnosis not present

## 2019-11-16 DIAGNOSIS — N1832 Chronic kidney disease, stage 3b: Secondary | ICD-10-CM | POA: Diagnosis not present

## 2019-11-17 ENCOUNTER — Other Ambulatory Visit: Payer: Self-pay | Admitting: *Deleted

## 2019-11-17 DIAGNOSIS — R0989 Other specified symptoms and signs involving the circulatory and respiratory systems: Secondary | ICD-10-CM | POA: Diagnosis not present

## 2019-11-17 DIAGNOSIS — I10 Essential (primary) hypertension: Secondary | ICD-10-CM

## 2019-11-17 NOTE — Patient Outreach (Addendum)
[facility] Post-Acute Care Coordinator follow up  Notification from [facility] with update. Member will dc on Friday 11/19/19 to home with [facility]. Facility Medicaid was applied for not community.   Telephone call made to [name] (daughter-in-law). Patient identifiers confirmed. [name] called member's husband Mr. Figge on 3-way call to discuss [facility] services. Patient identifiers confirmed.  Explained [facility] Care Management services for care coordination, [facility] LCSW for meals and potential transportation, community resources. Explained [facility] for home visits and medication management and [facility] (approved by [facility] Asst Director) for caregiver services for short term.  Mr. Nevels is agreeable to all of the above services. States he will need help with managing member's medications. States member has a pill box but he doesn't know if it is correct. Mrs. Blizzard has dementia. Will request [facility] to assist with medication management.   Mr. Lamison gives Clinical research associate and team permission to contact [name] (daughter-in-law) [phone] for further collaboration and to assist in coordinating care. Mr. Rothrock and [name] state they are working on additional assistance arrangements for member.   Mr. Grega states he still drives and that he is going to visit Mrs. Stillson at the facility today. States he prefers that he receives call to schedule home visit tomorrow rather than today.   Will update [facility]. Will make [facility] CM referrals. Will make referral to [facility].    [name], MSN-Ed, RN,BSN [facility] Post Acute Care Coordinator [phone] [facility]) [phone]  (Toll free office)

## 2019-11-18 ENCOUNTER — Encounter: Payer: Self-pay | Admitting: *Deleted

## 2019-11-18 ENCOUNTER — Other Ambulatory Visit: Payer: Self-pay | Admitting: *Deleted

## 2019-11-18 NOTE — Patient Outreach (Addendum)
Sonya Chavez) Care Management  11/18/2019  [place] M Reinders 05-21-1926 [id number]   [place] had a lengthy conversation with Marthenia Rolling, [place] Coordinator with [place] Management, regarding patient and patient's current plan of care.  Ms. Nevada Crane informed CSW that patient will be discharging from [facility] [place] where patient currently resides to receive long-term care services, tomorrow (Friday, November 19, 2019), returning home to live with her husband, Lakysha Kossman.  Patient was released from [facility] because they reported that patient was "too high functioning" to remain at that level of care for long-term care services.  Ms. Nevada Crane has already made arrangements for patient to receive home health physical therapy, occupational therapy, nursing and aid services through [facility].  Ms. Nevada Crane has also referred patient to [facility], [place] Management, to oversee patient's care in the home.  Patient's referral to [facility] is still pending.  Ms. Nevada Crane obtained approval from management to enlist the help of Oren Section and her team of staff, with [facility], who have agreed to staff patient's case for the next several weeks, ensuring that patient has everything she needs to return home safely.       Ms. Nevada Crane reported that patient has already applied for Adult Medicaid, through the [place], and that patient's application is still pending approval.  Ms. Nevada Crane encouraged CSW to speak directly with patient's daughter-in-law, Roderic Palau (# [phone]), as Mrs. Donita Brooks is currently trying to manage patient's care from [facility], along with her husband, patient's son, Junious Dresser.  CSW was able to make initial contact with patient's husband, Ashlay Altieri today, to obtain verbal consent to speak with  Mrs. Donita Brooks to coordinate patient's care in the home.  CSW is aware that patient has very limited local family support.  CSW and Ms. Nevada Crane are concerned about how this 86 year old female, with Dementia, is going to be able to properly care for herself in the home, making her an extremely high risk for readmission into the hospital.  Apparently, Mr. Mehlman was adamant about patient returning home to live with him at time of discharge, despite the fact that he can barely care for himself, let alone both of them.  Mr. Dalia was also opposed to patient being placed into a long-term care assisted living facility; although, she would greatly benefit from memory care services in an assisted living facility, due to her current level of confusion and poor cognition.       Ms. Nevada Crane was able to make contact with Charlena Cross [phone]), Case Worker at the [place], Adult [employer], to follow-up on a previous report that had been filed while patient was hospitalized.  [place] plans to initiate a home visit with patient, hopefully within the next week, to ensure that patient has the capacity to care for herself independently in the home.  The initial report was filed by hospital staff due to patient's sister admitting that patient had been found in soiled clothing, trying to cook for herself and Mr. Heath, leaving the oven and stove on, just prior to patient being admitted into the hospital.  Staff also noted that patient had been experiencing frequent falls and was diagnosed with failure to thrive.  Ms. Nevada Crane is requesting that CSW assist patient with obtaining nutritional services, a month's supply of meals through [employer], courtesy  of [facility] [employer], music, as well as a referral to [employer], through [employer] [place].  CSW has referred patient to [employer], so she will receive her first set of two weeks worth of  meals early next week.  CSW has also referred patient for Mobile Me[employer], placing her and Mr. Meno on the extensive waiting list for services.   CSW was able to make contact with Mrs. Marylyn Ishihara today to perform the initial phone assessment on patient, as well as assess and assist with social work needs and services.  CSW introduced self, explained role and types of services provided through [employer] Care Management [facility] Care Management).  CSW further explained to Mrs. Herbin that CSW works with Ms. Margo Aye, verbalizing the reason for the call.  CSW obtained two HIPAA compliant identifiers from Mrs. Marylyn Ishihara, which included patient's name and date of birth.  Mrs. Marylyn Ishihara denied having any interest in placing patient into a long-term care facility, of any kind, wanting to know what resources are available to care for patient in the home.  CSW explained to Mrs. Herbin that if patient is eligible for Adult Medicaid, then she will be entitled to apply for free in-home care services through [employer] and [facility].  CSW agreed to email Mrs. Herbin ([email or web address]), per her request, an application for [employer] Energy manager), through the [facility] Department of [facility], in addition to an application for [employer]), through the [employer] of [employer] and [employer].  CSW will then follow-up with Mrs. Marylyn Ishihara next week, on Friday, November 26, 2019, around 11:00am, to confirm receipt of the applications.  Danford Bad, BSW, MSW, LCSW  Licensed Restaurant manager, fast food Health System  Mailing [place] [address], [place], [place] [address] Physical Address-[address], [place], [place] [address] Toll Free Main # [phone] Fax # [phone] Cell # [phone]  Office # [phone] [email or web address]

## 2019-11-18 NOTE — Patient Outreach (Signed)
[facility] Post-Acute Care Coordinator follow up  Message left for [facility] to see if they are able to accept referral.   Telephone call made to Sonya Chavez (daughter- in- law) to make aware [facility] is still pending. Will update once hear back from them.   Also discussed outpatient palliative care referral. [name] states she thinks it will be a good idea.   Noted palliative care discussions began in most recent hospitalization. Ongoing goals of care discussions still needed. Will make outpatient palliative care referral to [employer].    [name], MSN-Ed, RN,BSN [facility] Post Acute Care Coordinator [phone] [facility]) [phone]  (Toll free office)

## 2019-11-19 DIAGNOSIS — E875 Hyperkalemia: Secondary | ICD-10-CM | POA: Diagnosis not present

## 2019-11-19 DIAGNOSIS — G309 Alzheimer's disease, unspecified: Secondary | ICD-10-CM | POA: Diagnosis not present

## 2019-11-19 DIAGNOSIS — F0281 Dementia in other diseases classified elsewhere with behavioral disturbance: Secondary | ICD-10-CM | POA: Diagnosis not present

## 2019-11-19 DIAGNOSIS — E872 Acidosis: Secondary | ICD-10-CM | POA: Diagnosis not present

## 2019-11-22 ENCOUNTER — Other Ambulatory Visit: Payer: Self-pay

## 2019-11-22 ENCOUNTER — Telehealth: Payer: Self-pay

## 2019-11-22 ENCOUNTER — Other Ambulatory Visit: Payer: Medicare Other

## 2019-11-22 ENCOUNTER — Other Ambulatory Visit: Payer: Self-pay | Admitting: *Deleted

## 2019-11-22 DIAGNOSIS — I252 Old myocardial infarction: Secondary | ICD-10-CM | POA: Diagnosis not present

## 2019-11-22 DIAGNOSIS — Z9181 History of falling: Secondary | ICD-10-CM | POA: Diagnosis not present

## 2019-11-22 DIAGNOSIS — I5031 Acute diastolic (congestive) heart failure: Secondary | ICD-10-CM | POA: Diagnosis not present

## 2019-11-22 DIAGNOSIS — M1990 Unspecified osteoarthritis, unspecified site: Secondary | ICD-10-CM | POA: Diagnosis not present

## 2019-11-22 DIAGNOSIS — I130 Hypertensive heart and chronic kidney disease with heart failure and stage 1 through stage 4 chronic kidney disease, or unspecified chronic kidney disease: Secondary | ICD-10-CM | POA: Diagnosis not present

## 2019-11-22 DIAGNOSIS — R627 Adult failure to thrive: Secondary | ICD-10-CM | POA: Diagnosis not present

## 2019-11-22 DIAGNOSIS — I2510 Atherosclerotic heart disease of native coronary artery without angina pectoris: Secondary | ICD-10-CM | POA: Diagnosis not present

## 2019-11-22 DIAGNOSIS — N1832 Chronic kidney disease, stage 3b: Secondary | ICD-10-CM | POA: Diagnosis not present

## 2019-11-22 DIAGNOSIS — E1122 Type 2 diabetes mellitus with diabetic chronic kidney disease: Secondary | ICD-10-CM | POA: Diagnosis not present

## 2019-11-22 DIAGNOSIS — M6259 Muscle wasting and atrophy, not elsewhere classified, multiple sites: Secondary | ICD-10-CM | POA: Diagnosis not present

## 2019-11-22 DIAGNOSIS — Z515 Encounter for palliative care: Secondary | ICD-10-CM

## 2019-11-22 DIAGNOSIS — F0280 Dementia in other diseases classified elsewhere without behavioral disturbance: Secondary | ICD-10-CM | POA: Diagnosis not present

## 2019-11-22 DIAGNOSIS — E785 Hyperlipidemia, unspecified: Secondary | ICD-10-CM | POA: Diagnosis not present

## 2019-11-22 DIAGNOSIS — G309 Alzheimer's disease, unspecified: Secondary | ICD-10-CM | POA: Diagnosis not present

## 2019-11-22 NOTE — Patient Outreach (Signed)
[facility]) Care Management  11/22/2019  [place] M Snell [birth year] [id number]   Referral Date: 4/15 Referral Source: Post Acute Coordinator Date of Admission: 3/8  Diagnosis: Generalized Weakness Date of Discharge: 4/16 Facility: [facility]  Outreach attempt #, successful to daughter in law [place], member's identity verified.  This care manager introduced self and stated purpose of call.  [facility] care management services explained, she is familiar as [facility] nurse and CSW has already been in contact with her.  Social: Lives with husband, however noted that he may not be able to care for member nor for himself.  However, he has been adamant about her staying in the home.  Her son and daughter in law live in [place] but are working to provide support telephonically with keeping appointments and calls organized.  Explained to [place] the difference between [facility], [facility] home health, [facility], [facility] Palliative services, and [facility].   Conditions: Per chart, has history of HTN, CAD, PAD, DM, CKD, and alzheimer's.    Medications: Again, daughter in law out of town but report visiting home nurses are working to help husband with medication management.    Appointments: [name] unsure is member has appointment scheduled with PCP.  State message was left with office, waiting on call back to schedule visit.     Plan: RN CM will follow up with member/ daughter in law within the next week.  Will collaborate with Remote [facility] and CSW on plan of care.  Fall Risk  11/18/2019  Falls in the past year? 1  Number falls in past yr: 1  Injury with Fall? 1  Risk for fall due to : History of fall(s);Impaired balance/gait;Impaired mobility;Mental status change  Follow up Education provided;Falls prevention discussed   Depression screen [facility] 2/9 11/18/2019  Decreased Interest 0  Down, Depressed, Hopeless 0  PHQ - 2 Score 0   THN CM Care Plan Problem One     Most  Recent Value  Care Plan Problem One  Risk for readmission related to weakness/fall as evidenced by recent hospitalization requiring SNF stay  Role Documenting the Problem One  Care Management Coordinator  Care Plan for Problem One  Active  [facility] Long Term Goal   Member will not be admitted to acute care facility within the next 31 days  THN Long Term Goal Start Date  11/22/19  Interventions for Problem One Long Term Goal  Discharge instructions reviewed with member's daughter in law. Explained different agencies/disciplines and educated on plan of care  THN CM Short Term Goal #1   Member will have follow up appointment with PCP within the next 2 weeks  THN CM Short Term Goal #1 Start Date  11/22/19  Interventions for Short Term Goal #1  Call placed to PCP office to request appointment be scheduled  THN CM Short Term Goal #2   Family will report plan for increased support in the home within the next 2 weeks  THN CM Short Term Goal #2 Start Date  11/22/19  Interventions for Short Term Goal #2  Collaborate with PAC, CSW, and remote health team to establish plan of care     Kemper Durie, RN, MSN [facility] Care Management  [facility] Care Manager [phone]

## 2019-11-22 NOTE — Telephone Encounter (Signed)
Telephone call to patient to schedule palliative care visit with patient. Patient/family in agreement with home visit on 11-22-19 at 11:00AM.

## 2019-11-22 NOTE — Progress Notes (Signed)
COMMUNITY PALLIATIVE CARE SW NOTE  PATIENT NAME: Sonya Chavez DOB: March 05, 1926 MRN: [id number]  PRIMARY CARE PROVIDER: Velna Hatchet, MD  RESPONSIBLE PARTY:  Acct ID - Guarantor Home Phone Work Phone Relationship Acct Type  [id number] Sonya Chavez,* [phone]  Self P/F     [address], [place],  [address]     PLAN OF CARE and INTERVENTIONS:             1. GOALS OF CARE/ ADVANCE CARE PLANNING:  Patient is FULL CODE. Discussed code status at length. Goals of care form is complete, and noted that HCPOA is Sonya Chavez (patient's son) and Sonya Chavez. Goal is for patient to remain in the home as long as she is able.  2. SOCIAL/EMOTIONAL/SPIRITUAL ASSESSMENT/ INTERVENTIONS:  SW and RN met with patient and Sonya Chavez (patient's husband) in the home. Home health nurse from [facility] was present when team arrived. Home health nurse was discussing heart failure diagnosis and monitoring diet and weight. Home health nurse provided patient and Sonya Chavez with educational chart. Patient discharged from [facility] on 4/16. Patient is happy to be home. Patient denies pain. Patient said she is sleeping well. RN reviewed medications, patient has nebulizer solution but no nebulizer is present in the home. SW left VM for [employer]. Patient lives in the home with her husband. They have been married for over 18 years. Patient has one son, that lives in [place]. Patient noted many grandchildren and great grandchildren, hopeful that they will visit soon. SW provided education on palliative care, discussed goals, reviewed resources and provided supportive counseling. 3. PATIENT/CAREGIVER EDUCATION/ COPING:  Patient is alert, engaged during visit. Patient was able to answer questions appropriately but is confused at times. Patient is oriented to self and place, and at times situation. Patient is coping well overall and denies concerns, pleased to be home. Supportive family but limited local  support. 4. PERSONAL EMERGENCY PLAN:  Family will call 9-1-1 for emergencies. 5. COMMUNITY RESOURCES COORDINATION/ HEALTH CARE NAVIGATION:  Patient and Sonya Chavez are trying to manage care at home. Per Epic review, there are some care concerns and safety concerns in the home. It appears that an APS referral was made during hospitalization. Sonya Chavez, THN LCSW noted that [facility] has assigned case worker, Sonya Chavez [phone]). SW to discuss with Sonya Chavez. Patient is receiving home health PT, OT and SW with [facility]. [facility] with [facility] is also involved. Mom's Meals and Meals on Wheels referrals are in progress. 6. FINANCIAL/LEGAL CONCERNS/INTERVENTIONS:  None noted during visit. LCSW with THN has noted Medicaid was discussed with family.     SOCIAL HX:  Social History   Tobacco Use  . Smoking status: Never Smoker  . Smokeless tobacco: Never Used  Substance Use Topics  . Alcohol use: No    CODE STATUS: FULL CODE ADVANCED DIRECTIVES: Y MOST FORM COMPLETE:  No. HOSPICE EDUCATION PROVIDED: None.  PPS: Patient is mostly independent of ADLs physically, but needs standby assist and prompting due to dementia. Patient does use a rollator.  I spent 60 minutes with patient/family, from 11:45a-12:45p providing education, support and consultation.   Sonya Lombard, LCSW

## 2019-11-22 NOTE — Progress Notes (Signed)
PATIENT NAME: Sonya Chavez DOB: May 27, 1926 MRN: [id number]  PRIMARY CARE PROVIDER: Velna Hatchet, MD  RESPONSIBLE PARTY:  Acct ID - Guarantor Home Phone Work Phone Relationship Acct Type  [id number] Taraoluwa Thakur,* [phone]  Self P/F     [address], [place], [place] [address]    PLAN OF CARE and INTERVENTIONS:               1.  GOALS OF CARE/ ADVANCE CARE PLANNING:  [place] services and get stronger, remain at home for as long as patients care can be managed.               2.  PATIENT/CAREGIVER EDUCATION:  Education on palliative care services, education on foods low in sodium, education on need to weight self daily and call nurse on MD with weight gain of more than 3 lbs, education on fall precautions, support                 4. PERSONAL EMERGENCY PLAN:  Patients husband in home with patient.  Patient nor husband have Lifeline in place.                5.  DISEASE STATUS:  SW and RN made scheduled palliative care home visit. Palliative care team met with patient and her husband Sonya Chavez. Murray [name] from [facility] in home with patient and husband when palliative care team arrived. Patient admitted to [facility] today and will be receiving nursing, Social Work, PT and OT services. Patient returned home from [facility] over the weekend. Patient states she is glad to be home. Patient has difficulty with memory and husband also has memory issues. Patient denies having pain at the present time. Patient uses a rollator walker when ambulating. Education done by home health nurse on need to weigh self daily, education on low sodium diet and palliative care RN reinforce teaching and also provided education on medications as well as fall precautions. Patient's past medical history includes but not limited to hypertension, CAD, left bundle branch block, subclavian artery stenosis, peripheral artery disease, history of small bowel obstruction, diabetes, Alzheimer's  dementia, chronic kidney disease stage 3, weakness and failure to thrive. Patients weight today 119 pounds.  Patient was not eating well at facility. Patient vital signs are stable. Patient not currently on 02. Patient reports she ate good yesterday and husband feels patient may eat better since being home. Patient able to do own personal care and patient has a walk-in shower. Patient unable to take medications and husband assist patients with taking meds.  Husband administers patients Albuterol orally last PM to patient.  Education to husband that this medication is to be used in nebulizer. Social worker placed call to MD's office to see if patient needs to continue this med as patient does not have nebulizer in home. Nurse wrote on medication cards AM and PM as well as AM and PM both if patient had to take medications twice a day. Patient reports she slept well last PM.   Patient denies having any shortness of breath or cough. Patient has trace edema in her lower extremities. Patient and wife report they have received CoVid 19 vaccinations. Patient has 1 son and daughter-in-law who lives in [place]. Husband asked several times who is coming to visit next. Education to husband that home health will be coming as well as palliative care. Patient and husband in agreement with palliative care services. Patient and husband encouraged to contact  palliative care with questions or concerns.     HISTORY OF PRESENT ILLNESS: Patient is a 86 year old female who resides at home with her husband.  Patient receiving [facility] health services through [facility].  Patient and open to palliative care services and will be seen monthly and PRN.   CODE STATUS: Full Code  ADVANCED DIRECTIVES:No  MOST FORM: No  PPS: 50%   PHYSICAL EXAM:   VITALS: Today's Vitals   11/22/19 1149  BP: 130/76  Pulse: 93  Resp: 14  Temp: 99.6 F (37.6 C)  TempSrc: Temporal  SpO2: 96%  Weight: 119 lb (54 kg)  Height: _0  (1.651  m)  PainSc: 0-No pain    LUNGS: clear to auscultation  CARDIAC: Cor RRR  EXTREMITIES: 1+ edema in LE's SKIN: no visible open areas of skin breakdown  NEURO: positive for gait problems and memory problems       Sonya Simmer, RN

## 2019-11-26 ENCOUNTER — Other Ambulatory Visit: Payer: Self-pay | Admitting: *Deleted

## 2019-11-26 NOTE — Patient Outreach (Signed)
[facility]) Care Management  11/26/2019  [place] M Ferrari 1925-08-21 [id number]   CSW was able to make contact with patient's daughter-in-law, [name] today to follow-up regarding social work services and resources for patient.  CSW inquired about patient's recent consult with [facility], for which Mrs. Marylyn Ishihara indicated that she did not believe that these services would be necessary for patient at this time, due to patient not currently suffering from any type of pain symptoms.  Mrs. Marylyn Ishihara reported that patient appears to be pleased with the services that she is receiving through [facility], as well as [facility], but realizes that all of these services are only short-term, and that a long-term plan will need to be developed.    Mrs. Marylyn Ishihara stated that it has only been a few weeks since she applied for Adult Medicaid for patient, through the [facility] Department of [facility], realizing that it can take 45-60 days to process the application, perhaps even longer, due to COVID-19 restrictions.  CSW encouraged Mrs. Herbin to contact CSW as soon as she receives the approval/denial letter in the mail from the Department of Social Services, so that CSW can submit patient's applications for [employer] Orthoptist), through the [facility] Department of Health and [employer] and [employer]S Energy manager), through the [facility] Department of Social Services.    Mrs. Marylyn Ishihara voiced understanding and was agreeable to this plan.  CSW emailed copies of the applications (PCS and CAPS) to Mrs. Herbin, per her request, for her independent review.  CSW wanted to confirm with Mrs. Marylyn Ishihara that patient received her first set of meals (two weeks worth) through [employer], having submitted patient's application last week, on Thursday, November 18, 2019.  Mrs. Marylyn Ishihara admitted that patient  declined delivery of these meals, thinking that it would just be a frozen meal that she would need to heat up in the microwave.  CSW explained otherwise, reporting that these meals are tailor-made specifically for patient, with food allergies and dietary restrictions in mind.  With that being said, Mrs. Marylyn Ishihara encouraged CSW to go ahead and place a new referral to [employer] for patient, agreeing to contact patient today to explain the difference and obtain patient's approval for delivery.  CSW has placed a new referral, including patient's food preferences, so that patient will receive a months supply of meals, for a total of 28 days.  CSW then inquired about CSW's referral to [employer], through [employer] of [facility], made last week on patient's behalf.  Mrs. Marylyn Ishihara reported that patient "is not" interested in receiving Mobile Meals, apologizing for speaking prematurely.  CSW voiced understanding, agreeing to remove patient's name from the waiting list.  CSW is aware that a call may not be received from Mrs. Marylyn Ishihara for at least another month, month and a half, regarding the status of patient's Adult Medicaid application, but CSW has agreed to follow-up with Mrs. Marylyn Ishihara within the next three weeks, on Friday, Dec 17, 2019, around 9:00am, just to touch base.  CSW was able to confirm that Mrs. Marylyn Ishihara has the correct contact information for CSW, encouraging Mrs. Herbin to contact CSW directly if additional social work needs arise in the meantime, or if she has questions about patient's current plan of care.  Mrs. Marylyn Ishihara was most appreciative of the call and of all services provided through [employer] Care Management.  Danford Bad, BSW, MSW, [employer]  Licensed Clinical Social Worker  [place]  Mailing [place] [address], [place], [place] [address] Physical Address-[address], [place], [place] [address] Toll Free Main #  [phone] Fax # [phone] Cell # [phone]  Office # [phone] [email or web address]

## 2019-11-29 ENCOUNTER — Other Ambulatory Visit: Payer: Self-pay | Admitting: *Deleted

## 2019-11-29 ENCOUNTER — Other Ambulatory Visit: Payer: Self-pay | Admitting: Cardiology

## 2019-11-29 NOTE — Patient Outreach (Signed)
[facility]) Care Management  11/29/2019  [name] [birth year] [id number]   Weekly transition of care call placed to member's daughter in law Sonya Chavez.  She report member is reportedly doing much better than when she was discharged from rehab.  State she spoke with member yesterday and was told that she is able to prepare some meals now independently as well as bathe and dress on her own.  This care manager confirmed this with Sonya Chavez as [facility] collaborated with [place] from [facility] providers and was notified that they would not open case at this time.  Member was able to demonstrate performing ADL's with minimal assistance during assessment last week.  Sonya Chavez is made aware to notify this care manager should member's condition change and she need more assistance.    This care manager inquired about follow up with PCP office as message was left by this RNCM and the family to request appointment, no call has been received back.  Sonya Chavez will call again to both office and to member to verify appointment has been scheduled.  If not, she will notify this care manager to call office again.  She denies any urgent concerns at this time, will follow up within the next week.  THN CM Care Plan Problem One     Most Recent Value  Care Plan Problem One  Risk for readmission related to weakness/fall as evidenced by recent hospitalization requiring SNF stay  Role Documenting the Problem One  Care Management [place] for Problem One  Active  [facility] Long Term Goal   Member will not be admitted to acute care facility within the next 31 days  THN Long Term Goal Start Date  11/22/19  Interventions for Problem One Long Term Goal  Plan of care reviewed with daughter in law, including current community agencies in the home and their different functions.  THN CM Short Term Goal #1   Member will have follow up appointment with PCP within the next 2 weeks  THN CM Short Term Goal #1 Start Date   11/22/19  Interventions for Short Term Goal #1  Daughter in law advised to follow up with member and/or PCP office to verify scheduled appointment  [facility] CM Short Term Goal #2   Family will report plan for increased support in the home within the next 2 weeks  [facility] CM Short Term Goal #2 Start Date  11/22/19  [facility] CM Short Term Goal #2 Met Date  11/29/19      Valente David, RN, MSN [place] [phone]

## 2019-12-03 ENCOUNTER — Telehealth: Payer: Self-pay

## 2019-12-03 DIAGNOSIS — D649 Anemia, unspecified: Secondary | ICD-10-CM | POA: Diagnosis not present

## 2019-12-03 DIAGNOSIS — Z7189 Other specified counseling: Secondary | ICD-10-CM | POA: Diagnosis not present

## 2019-12-03 DIAGNOSIS — E8809 Other disorders of plasma-protein metabolism, not elsewhere classified: Secondary | ICD-10-CM | POA: Diagnosis not present

## 2019-12-03 DIAGNOSIS — R627 Adult failure to thrive: Secondary | ICD-10-CM | POA: Diagnosis not present

## 2019-12-03 DIAGNOSIS — N390 Urinary tract infection, site not specified: Secondary | ICD-10-CM | POA: Diagnosis not present

## 2019-12-03 DIAGNOSIS — R630 Anorexia: Secondary | ICD-10-CM | POA: Diagnosis not present

## 2019-12-03 DIAGNOSIS — I130 Hypertensive heart and chronic kidney disease with heart failure and stage 1 through stage 4 chronic kidney disease, or unspecified chronic kidney disease: Secondary | ICD-10-CM | POA: Diagnosis not present

## 2019-12-03 DIAGNOSIS — N1832 Chronic kidney disease, stage 3b: Secondary | ICD-10-CM | POA: Diagnosis not present

## 2019-12-03 DIAGNOSIS — F0281 Dementia in other diseases classified elsewhere with behavioral disturbance: Secondary | ICD-10-CM | POA: Diagnosis not present

## 2019-12-03 DIAGNOSIS — E875 Hyperkalemia: Secondary | ICD-10-CM | POA: Diagnosis not present

## 2019-12-03 DIAGNOSIS — E872 Acidosis: Secondary | ICD-10-CM | POA: Diagnosis not present

## 2019-12-03 DIAGNOSIS — G309 Alzheimer's disease, unspecified: Secondary | ICD-10-CM | POA: Diagnosis not present

## 2019-12-03 NOTE — Telephone Encounter (Signed)
Phone call placed received from Mcneil Sober NP. Lillia Abed provided update that patient had a visit today at PCP office and decided to change code status to a DNR. Primary Palliative team updated.

## 2019-12-07 ENCOUNTER — Other Ambulatory Visit: Payer: Self-pay | Admitting: *Deleted

## 2019-12-07 NOTE — Patient Outreach (Signed)
[facility]) Care Management  12/07/2019  [place] M Mooradian [birth year] [id number]   Weekly transition of care call placed to member's daughter in law, Vaughan Basta, no answer.  HIPAA compliant voice message left, will await call back.  If no call back, will follow up within the next 3-4 business days.     Update:  Call received back from Valier.  She report member has still been progressing well.  Confirmed via chart and Vaughan Basta that member did attend follow up appointment with PCP, no issues noted.  She report member has stated she continues to get stronger, no longer will need Mom's meals because she is now able to cook for herself and her husband.  Will notify CSW to stop deliveries per [facility] request.  Vaughan Basta feel member and her husband are at a good point now where they should be able to answer follow up questions.  Will follow up with member directly next week but will still collaborate with Vaughan Basta for management of care.  THN CM Care Plan Problem One     Most Recent Value  Care Plan Problem One  Risk for readmission related to weakness/fall as evidenced by recent hospitalization requiring SNF stay  Role Documenting the Problem One  Care Management [place] for Problem One  Active  [facility] Long Term Goal   Member will not be admitted to acute care facility within the next 31 days  THN Long Term Goal Start Date  11/22/19  [facility] CM Short Term Goal #1   Member will have follow up appointment with PCP within the next 2 weeks  THN CM Short Term Goal #1 Start Date  11/22/19  [facility] CM Short Term Goal #1 Met Date  12/07/19      Valente David, RN, MSN [place] [phone]

## 2019-12-16 ENCOUNTER — Other Ambulatory Visit: Payer: Self-pay | Admitting: *Deleted

## 2019-12-16 NOTE — Patient Outreach (Signed)
[facility]) Care Management  12/16/2019  [place] M Sturges 02-Dec-1925 [id number]   Weekly transition of care call placed to member, successful.  She report she is doing well, denies any pain/discomfort at this time.  Does not have much time to talk, report nurse is visiting at this time.  Report she and her husband have been able to manage her care with her remaining in the home. State daughter in law Vaughan Basta will be visiting the end of next week.  Denies any urgent concerns, denies any falls.  Will follow up within the next 2 weeks.  THN CM Care Plan Problem One     Most Recent Value  Care Plan Problem One  Risk for readmission related to weakness/fall as evidenced by recent hospitalization requiring SNF stay  Role Documenting the Problem One  Care Management [place] for Problem One  Active  [facility] Long Term Goal   Member will not be admitted to acute care facility within the next 31 days  THN Long Term Goal Start Date  11/22/19  [facility] Long Term Goal Met Date  12/16/19  [facility] CM Short Term Goal #1   Member will have follow up appointment with PCP within the next 2 weeks  THN CM Short Term Goal #1 Start Date  11/22/19  [facility] CM Short Term Goal #1 Met Date  12/07/19     Valente David, RN, MSN [place] [phone]

## 2019-12-17 ENCOUNTER — Ambulatory Visit: Payer: Medicare Other | Admitting: *Deleted

## 2019-12-17 ENCOUNTER — Encounter: Payer: Self-pay | Admitting: *Deleted

## 2019-12-17 ENCOUNTER — Other Ambulatory Visit: Payer: Self-pay | Admitting: *Deleted

## 2019-12-17 NOTE — Patient Outreach (Signed)
[facility]) Care Management  12/17/2019  [name] [birth year] [id number]  CSW was able to make contact with patient's daughter-in-law, Sonya Chavez today to follow-up regarding social work services and resources for patient, as well as to inquire about the status of patient's Adult Medicaid application, submitted to the [place] for processing, exactly three weeks ago.  Just prior to contacting Sonya Chavez, CSW contacted patient's Medicaid Case Worker, with the [place], Sonya Chavez [phone]. [phone]), to try and check the status of patient's Adult Medicaid application; however, Sonya Chavez was unavailable at the time of CSW's call.  A HIPAA compliant message was left on voicemail for Sonya Chavez and CSW is currently awaiting a return call.  CSW agreed to follow-up with Sonya Chavez to report findings of conversation with Sonya Chavez, if a return call is received.     Otherwise, CSW encouraged Sonya Chavez to follow-up with Sonya Chavez directly, as she is Haematologist and has signed a release of information form with Sonya Chavez, allowing her to communicate personal information regarding patient.  Sonya Chavez voiced understanding and was agreeable to this plan, indicating that she will probably begin calling Sonya Chavez next Friday, Dec 24, 2019, as it will have been exactly one month since patient's application for Adult Medicaid was submitted.  CSW reminded Sonya Chavez that it can take anywhere from 45-60 days to process the application, and perhaps even a little longer at present, due to COVID-19.  Unfortunately, the reality is that Sonya Chavez may be required to wait an additional month to learn whether or not patient has actually been approved or denied for Adult Medicaid coverage, after a letter is sent to patient's home.  Sonya Chavez reported that she  has already begun completion of patient's applications for CAPS Forensic scientist), through the [place], as well as [employer] Youth worker), through the [place].  Now, Sonya Chavez is just waiting for patient's approval/denial letter from the [place] in order to submit the CAPS and PCS applications for processing.  CSW inquired as to how CSW could be of further assistance to Sonya Chavez at this time, but Sonya Chavez denied the need for any additional social work services at present.  CSW was able to confirm that Sonya Chavez has the correct contact information for CSW, encouraging Sonya Chavez to contact CSW directly if additional social work needs arise in the near future.  Sonya Chavez stated that patient and her husband, Sonya Chavez are "getting along quite well", and that patient is better able to manage her activities of daily living independently.  Sonya Chavez requested that CSW cancel delivery of patient's remaining order for [employer], as patient has regained her strength and mobility, now able to prepare meals for herself and Sonya Chavez.  CSW will perform a case closure on patient, as all goals of treatment have been met from social work standpoint and no additional social work needs have been identified at this time.  CSW will notify patient's RNCM with [place] Management, Sonya Chavez of CSW's plans to close patient's case.  CSW will fax an update to patient's Primary Care Physician, Sonya Chavez to ensure that they are aware of CSW's involvement with patient's plan of care.    Sonya Chavez, Sonya Chavez, Sonya Chavez, [employer]  Licensed Clinical Social Worker  [place]  Mailing [place] [address] [place], [place] [address] Physical Address-[address], [place], [place]  [address] Toll Free Main # [phone] Fax # [phone] Cell # [phone]  Office # [phone] [email or web address]

## 2019-12-22 DIAGNOSIS — G309 Alzheimer's disease, unspecified: Secondary | ICD-10-CM | POA: Diagnosis not present

## 2019-12-23 ENCOUNTER — Telehealth: Payer: Self-pay

## 2019-12-23 NOTE — Telephone Encounter (Signed)
Telephone call to schedule palliative care visit.  Patient in agreement with RN making home visit 12/28/19 at 11:00 AM.

## 2019-12-28 ENCOUNTER — Other Ambulatory Visit: Payer: Medicare Other

## 2019-12-28 ENCOUNTER — Other Ambulatory Visit: Payer: Self-pay

## 2019-12-28 DIAGNOSIS — Z515 Encounter for palliative care: Secondary | ICD-10-CM

## 2019-12-28 NOTE — Progress Notes (Signed)
PATIENT NAME: Sonya Chavez DOB: 03-18-1926 MRN: [id number]  PRIMARY CARE PROVIDER: Alysia Penna, MD  RESPONSIBLE PARTY:  Acct ID - Guarantor Home Phone Work Phone Relationship Acct Type  [id number] Sonya Chavez,* [phone]  Self P/F     [address], [place], [place] [address]    PLAN OF CARE and INTERVENTIONS:               1.  GOALS OF CARE/ ADVANCE CARE PLANNING: Patient continues to receive Home Health Services. Goal is to continue to gain strength and remain in her home with her husband.               2.  PATIENT/CAREGIVER EDUCATION: Reinforced importance of fall prevention, monitoring weights. Provided education on monitoring for constipation (hx of SBO).                              3.  DISEASE STATUS: RN visit with patient today in her home. Patient's husband, Sonya Chavez, present, as well as, her son, Sonya Chavez and his wife, Sonya Chavez. Patient continues to receive [facility] from [facility]. Both patient and husband report that patient has become stronger and has been able to ambulate longer distances. Patient denies any pain at present. Patient's husband continues to monitor patient's weight, CBG and BP daily. Patient/husband aware to notify PCP or nurse if she has a weight gain over 3 pounds. Weight is up 3 pounds today from yesterday. Husband noted that there is a new scale in the home today. Provided education regarding fluid retention and he is aware that if weight increases again to notify PCP. Patient verbalized that she feels like she is eating better since being back at home. Encouraged patient/husband to reach out to Palliative care with any questions or concerns. Understanding expressed.    HISTORY OF PRESENT ILLNESS:  This is a 86 year old female residing in her home with her husband. Home Health services continue through [facility]. Palliative care will continue to follow patient monthly and prn.   CODE STATUS: DNR  ADVANCED DIRECTIVES: No MOST  FORM: No PPS: 50%   PHYSICAL EXAM:   VITALS: Today's Vitals   12/28/19 1600  BP: 140/80  Pulse: 70  Resp: 18  SpO2: 97%  Weight: 117 lb (53.1 kg)  PainSc: 0-No pain    LUNGS: Clear to Auscultation.  CARDIAC: Cor RRR EXTREMITIES: Warm, Dry. BLE edema 1+ SKIN: Denies any concerns or wounds NEURO: Occasional unsteady ambulation. Patient is awake, aware and verbally engaging. Patient and husband both report that patient has some difficulty with memory.        Estanislado Pandy, RN, BSN

## 2019-12-30 ENCOUNTER — Other Ambulatory Visit: Payer: Self-pay | Admitting: *Deleted

## 2019-12-30 NOTE — Patient Outreach (Signed)
[facility]) Care Management  12/30/2019  [place] M Mcquain 08-Sep-1925 [id number]   Call placed to member's daughter in law to follow up on member's status and recent home visit with palliative care.  No answer, HIPAA compliant voice message left.  Will follow up within the next 3-4 business days.  Kemper Durie, [place], MSN [facility] Care Management  [facility] Manager [phone]

## 2020-01-04 ENCOUNTER — Other Ambulatory Visit: Payer: Self-pay | Admitting: *Deleted

## 2020-01-04 NOTE — Patient Outreach (Signed)
[facility]) Care Management  01/04/2020  [place] Sonya Chavez Feb 04, 1926 [id number]   Outreach attempt #2, successful.  Call placed to member's daughter in law to follow up on member's ongoing progress.  She confirms that she and her husband were in town last week to visit member, state member was doing well.  She has continued to follow up with home health PT sessions, seeing signs of strength.  She report member's blood pressure had been slightly low but per chart, pressure was 140/90 during home visit with palliative care on 5/25.  Member and home health will continue to monitor trends and follow up with MD as needed.    State her appetite has still been diminished, started drinking boost.  Family will continue to encourage her to drink them.  Denies any urgent concerns at this time, encouraged to contact this care manager with questions.  THN CM Care Plan Problem One     Most Recent Value  Care Plan Problem One  Risk for injury as evidenced by dementia and history of falls  Role Documenting the Problem One  Care Management Coordinator  Care Plan for Problem One  Active  [facility] Long Term Goal   Member will not have any falls within the next 31 days  THN Long Term Goal Start Date  01/04/20  Interventions for Problem One Long Term Goal  Discussed with member's family interventions to decrease risk of fall, including consistent therapy with PT  THN CM Short Term Goal #1   Member will monitor and record blood pressure daily over the next 4 weeks  THN CM Short Term Goal #1 Start Date  01/04/20  Interventions for Short Term Goal #1  Family educated on importance of monitoring blood pressure and complications of hypotension, to include increassed risk of fall  THN CM Short Term Goal #2   Member will report increased oral intake within the next 4 weeks  THN CM Short Term Goal #2 Start Date  01/04/20  Interventions for Short Term Goal #2  Importance of adequate nutrition discussed  with family.  Encouraged to offer Boost/Ensure for increased protein and nutrient intake     Kemper Durie, Charity fundraiser, MSN [facility] Care Management  [facility] Manager [phone]

## 2020-01-28 ENCOUNTER — Telehealth: Payer: Self-pay

## 2020-01-28 DIAGNOSIS — Z515 Encounter for palliative care: Secondary | ICD-10-CM

## 2020-01-28 NOTE — Telephone Encounter (Signed)
Palliative care SW made TC to patient to schedule in home monthly visit. Patient shared hat she di not feel an in home visit was needed ans he was doing fine. Per patient is eating better and has incorporated boost/ensure into her diet. She has not had any falls and is ambulating with her cane well. Patient reported that she sleeps well and normally goes to bed around 930pm and wakes up around 8am the next morning. Patient shared that she has not ad any medication changes that she know of. She is able to shower independently ut only showers when her husband is with her to assist if needed. Patient shared that she is not longer receiving home health PT, Patient shared that her son and DIL visited from [place] last week and had a good visit with them. Patient stated that her church is will be allowing in person services this month and she and her husband are looking forward to returning to church. Patient had no needs or concerns of SW at this time and agreed upon an in person visit 7/20 @10am .

## 2020-02-03 ENCOUNTER — Other Ambulatory Visit: Payer: Self-pay | Admitting: *Deleted

## 2020-02-03 NOTE — Patient Outreach (Signed)
[facility]) Care Management  02/03/2020  [place] M Ibanez [birth year] [id number]   Call placed to member to follow up on management of health conditions, no answer.  Unable to leave voice message.  Call then placed to daughter in law [name], she report member has been doing well with improving her strength and independence but is still having trouble with her appetite.  She has boost and per notes, member report drinking them but [name] state this is not always true.  State member was on medication previously that increased appetite that worked for member, she will have her husband (member's son) call PCP to discuss and request new prescription.  She expresses concern regarding member's husband's ability to adequately manage her medications.  Report he does the best he can but can get confused at times.  He was having a visiting nurse fill pill box, [name] is uncertain if this is still happening.  This care manager will follow up with the home health agency and with palliative care to inquire about this service.  [name] denies any urgent concerns, will follow up within the next month.  Goals Addressed            This Visit's Progress   . Have 3 meals a day       CARE PLAN ENTRY (see longitudinal plan of care for additional care plan information)  Current Barriers:  Marland Kitchen Knowledge Deficits related to adequate diet  Nurse Case Manager Clinical Goal(s):  Marland Kitchen Over the next 31 days, patient will verbalize understanding of plan for increasing meal intake . Over the next 28 days, patient will demonstrate improved adherence to prescribed treatment plan for protein intake as evidenced bydrinking boost . Over the next 28 days, the patient will demonstrate ongoing self health care management ability as evidenced by preparing adequate meals*  Interventions:  . Inter-disciplinary care team collaboration (see longitudinal plan of care) . Reviewed medications with patient and discussed  importance of boost/protein supplement . Discussed plans with patient for ongoing care management follow up and provided patient with direct contact information for care management team  Patient Self Care Activities:  . Patient verbalizes understanding of plan to increase protein and food intake . Attends all scheduled provider appointments . Calls provider office for new concerns or questions   Initial goal documentation       Kemper Durie, RN, MSN [facility] Care Management  [facility] Manager [phone]

## 2020-02-18 DIAGNOSIS — I509 Heart failure, unspecified: Secondary | ICD-10-CM | POA: Diagnosis not present

## 2020-02-18 DIAGNOSIS — N1832 Chronic kidney disease, stage 3b: Secondary | ICD-10-CM | POA: Diagnosis not present

## 2020-02-18 DIAGNOSIS — R627 Adult failure to thrive: Secondary | ICD-10-CM | POA: Diagnosis not present

## 2020-02-18 DIAGNOSIS — M810 Age-related osteoporosis without current pathological fracture: Secondary | ICD-10-CM | POA: Diagnosis not present

## 2020-02-18 DIAGNOSIS — Z1331 Encounter for screening for depression: Secondary | ICD-10-CM | POA: Diagnosis not present

## 2020-02-18 DIAGNOSIS — E1169 Type 2 diabetes mellitus with other specified complication: Secondary | ICD-10-CM | POA: Diagnosis not present

## 2020-02-18 DIAGNOSIS — F0281 Dementia in other diseases classified elsewhere with behavioral disturbance: Secondary | ICD-10-CM | POA: Diagnosis not present

## 2020-02-18 DIAGNOSIS — Z0000 Encounter for general adult medical examination without abnormal findings: Secondary | ICD-10-CM | POA: Diagnosis not present

## 2020-02-18 DIAGNOSIS — I10 Essential (primary) hypertension: Secondary | ICD-10-CM | POA: Diagnosis not present

## 2020-02-18 DIAGNOSIS — M170 Bilateral primary osteoarthritis of knee: Secondary | ICD-10-CM | POA: Diagnosis not present

## 2020-02-18 DIAGNOSIS — I130 Hypertensive heart and chronic kidney disease with heart failure and stage 1 through stage 4 chronic kidney disease, or unspecified chronic kidney disease: Secondary | ICD-10-CM | POA: Diagnosis not present

## 2020-02-18 DIAGNOSIS — R82998 Other abnormal findings in urine: Secondary | ICD-10-CM | POA: Diagnosis not present

## 2020-02-18 DIAGNOSIS — G309 Alzheimer's disease, unspecified: Secondary | ICD-10-CM | POA: Diagnosis not present

## 2020-02-18 DIAGNOSIS — E785 Hyperlipidemia, unspecified: Secondary | ICD-10-CM | POA: Diagnosis not present

## 2020-02-22 ENCOUNTER — Other Ambulatory Visit: Payer: Medicare Other

## 2020-02-22 ENCOUNTER — Telehealth: Payer: Self-pay

## 2020-02-22 ENCOUNTER — Other Ambulatory Visit: Payer: Self-pay

## 2020-02-22 DIAGNOSIS — Z515 Encounter for palliative care: Secondary | ICD-10-CM

## 2020-02-22 NOTE — Progress Notes (Signed)
COMMUNITY PALLIATIVE CARE SW NOTE  PATIENT NAME: Sonya Chavez DOB: 1926/01/20 MRN: [id number]  PRIMARY CARE PROVIDER: Velna Hatchet, MD  RESPONSIBLE PARTY:  Acct ID - Guarantor Home Phone Work Phone Relationship Acct Type  [id number] Aemilia Dedrick,* [id number]  Self P/F     [address], [place], [place] [address]     PLAN OF CARE and INTERVENTIONS:             1. GOALS OF CARE/ ADVANCE CARE PLANNING: Patient is DNR. HCPOA is Wille Glaser (patient's son) and [name]. Goal is for patient to remain in the home as long as she is able. 2. SOCIAL/EMOTIONAL/SPIRITUAL ASSESSMENT/ INTERVENTIONS: SW met with patient and husband in the home for scheduled visit. Patient shared that she has been doing well and does not have any concerns for SW/palliative care at this time. Patient shared that she has increased her eating, although she does not eat breakfast often. Pt continues to drink supplements. Patient weighs self daily and husband writes all weights down, patients weight this morning was 116lb. Pt shared that she usually sleeps well at HS but sometimes is awaken by pain in her legs and feet from arthritis, patient stated that she would get up and have a cup of coffee when that happens and usually is able to go back to sleep without any issue. Husband shared that patient had a f/u appt with PCP last week and no medication changes were made. Pt shares that grandchildren are expected to be in town from [place] this weekend, patient and husband are looking forward to visit. Patient was up and down during whole visit showing SW pictures of family and friends. At end of visit patient and husband walked outside with SW, with no issue pt held onto husband hand for balance. Pt shared that she appreciates SW visits but does not feel SW needs to visit every month, due to her being fine and doing well. SW acknowledged pts statement but still encouraged a visit for next month, pt in agreement. No concerns at this time,  Palliative care will continue to follow. 3. PATIENT/CAREGIVER EDUCATION/ COPING:  Patient is alert, engaged during visit. Patient was able to answer questions appropriately but is confused at times, looks to husband to assist with remembering some things. Patient is oriented to self and place, and at times situation. Patient is coping well overall and denies concerns. Patient shares that she enjoys cooking and washing dishes and does this at least 2-3x/week. Supportive family but limited local support, patients church family has started making visits again and a sister who lives nearby that offers to cook Sunday dinner every weekend. 4. PERSONAL EMERGENCY PLAN:  Patient to call 911 for emergencies. 5. COMMUNITY RESOURCES COORDINATION/ HEALTH CARE NAVIGATION:  Husband manages patients care mainly, however patients son and DIL assist as much as possible from  A distance. Husband manages medications and has weekly pill box set up. FINANCIAL/LEGAL CONCERNS/INTERVENTIONS:  None.     SOCIAL HX:  Social History   Tobacco Use  . Smoking status: Never Smoker  . Smokeless tobacco: Never Used  Substance Use Topics  . Alcohol use: No    CODE STATUS: DNR ADVANCED DIRECTIVES: Y MOST FORM COMPLETE:  N HOSPICE EDUCATION PROVIDED: N  PPS: Patient is ambulatory without AD, but has RW, rollator, SPC, quad cane and WC. Husband assist wit bathing when needed. Patient has shower chair but states it is too large for bathroom.    Time Spent: 1 hr.  Doreene Eland, [place]

## 2020-02-24 NOTE — Telephone Encounter (Signed)
na

## 2020-03-06 ENCOUNTER — Other Ambulatory Visit: Payer: Self-pay | Admitting: *Deleted

## 2020-03-06 NOTE — Patient Outreach (Signed)
[facility]) Care Management  03/06/2020  [place] Sonya Chavez Dec 19, 1925 [id number]   Call placed to member to follow up on management of chronic medical conditions.  Spoke to husband, state member is doing well.  Report she was last by PCP a couple weeks ago, no concerns noted.  State member has remained able to increase her independence with ADL's and cooking.  He continues to help with medication management and scheduling office visits.  State her appetite has improved and she is taking in Boost for additional support. He monitors her weight daily, today it has decreased to 112 pounds but report it has been steady at 114 pounds.  She is no longer receiving home health but [facility] remains active with RN and CSW visits.  Denies any urgent concerns, advised to contact this care manager with questions.  Also advised to contact his daughter in law [place] with problems as well as she has been helping to manage conditions.  He verbalizes understanding, agrees to follow up within the next month.  Goals Addressed              This Visit's Progress   .  Per husband: Remain strong and independent (pt-stated)        CARE PLAN ENTRY (see longitudinal plan of care for additional care plan information)  Current Barriers:  . Cognitive Deficits  Nurse Case Manager Clinical Goal(s):  Marland Kitchen Over the next 31 days, patient will verbalize understanding of plan for long term management of dementia . Over the next 28 days, patient will meet with RN Care Manager to address increased strength and independence . Over the next 28 days, patient will demonstrate improved health management independence as evidenced byperforming ADL's independently  Interventions:  . Inter-disciplinary care team collaboration (see longitudinal plan of care) . Advised patient to contact this care manager with problems . Discussed plans with patient for ongoing care management follow up and provided patient with  direct contact information for care management team . Reviewed scheduled/upcoming provider appointments including:   Patient Self Care Activities:  . Attends all scheduled provider appointments . Performs ADL's independently . Calls provider office for new concerns or questions . Does not contact provider office for questions/concerns  Initial goal documentation       Kemper Durie, RN, MSN [facility] Care Management  [facility] Manager [phone]

## 2020-04-05 ENCOUNTER — Other Ambulatory Visit: Payer: Self-pay | Admitting: *Deleted

## 2020-04-05 NOTE — Patient Outreach (Signed)
[facility]) Care Management  04/05/2020  [place] Sonya Chavez 04/18/1926 [id number]   Call placed to member/husband to follow up on management of chronic medical conditions, no answer. Unable to leave voice message.  Will follow up within the next 3-4 business days, if remain unsuccessful will contact member's daughter in law.  Kemper Durie, [place], MSN [facility] Care Management  [facility] Manager [phone]

## 2020-04-11 ENCOUNTER — Other Ambulatory Visit: Payer: Self-pay | Admitting: *Deleted

## 2020-04-11 NOTE — Patient Outreach (Signed)
[facility]) Care Management  04/11/2020  [place] M Glidden 21-Nov-1925 [id number]    Outreach attempt #2, unsuccessful to member/husband.  HIPAA complaint voice message left.  Call then placed to member's daughter in law.  She report she last spoke to them yesterday, state member was doing well.  State member's husband has been helping to manage her health care, including medications.  They have been about to get out more and interact with friends/family in the area safely.  She denies any urgent concerns at this time, provides member's husband's cell phone number [phone]) in case they don't answer phone during next outreach attempt.  Will follow up with member/husband within the next month.  Goals Addressed              This Visit's Progress   .  Per husband: Remain strong and independent (pt-stated)   On track     CARE PLAN ENTRY (see longitudinal plan of care for additional care plan information)  Current Barriers:  . Cognitive Deficits  Nurse Case Manager Clinical Goal(s):  Marland Kitchen Over the next 31 days, patient will verbalize understanding of plan for long term management of dementia . Over the next 28 days, patient will meet with RN Care Manager to address increased strength and independence . Over the next 28 days, patient will demonstrate improved health management independence as evidenced byperforming ADL's independently  Interventions:  . Inter-disciplinary care team collaboration (see longitudinal plan of care) . Advised patient to contact this care manager with problems . Discussed plans with patient for ongoing care management follow up and provided patient with direct contact information for care management team . Reviewed scheduled/upcoming provider appointments including:   Patient Self Care Activities:  . Attends all scheduled provider appointments . Performs ADL's independently . Calls provider office for new concerns or questions . Does  not contact provider office for questions/concerns  Initial goal documentation       Kemper Durie, RN, MSN [facility] Care Management  [facility] Manager [phone]

## 2020-05-03 ENCOUNTER — Telehealth: Payer: Self-pay

## 2020-05-03 NOTE — Telephone Encounter (Signed)
Palliative care SW outreached patient and patients husband to complete telephonic visit. SW lef HIPPA compliant VM. Awaiting phone call.

## 2020-05-13 ENCOUNTER — Inpatient Hospital Stay (HOSPITAL_COMMUNITY)
Admission: EM | Admit: 2020-05-13 | Discharge: 2020-05-16 | DRG: 388 | Disposition: A | Discharge status: Home Health | Payer: Medicare Other | Attending: Internal Medicine | Admitting: Internal Medicine

## 2020-05-13 ENCOUNTER — Inpatient Hospital Stay (HOSPITAL_COMMUNITY): Payer: Medicare Other

## 2020-05-13 ENCOUNTER — Other Ambulatory Visit: Payer: Self-pay

## 2020-05-13 ENCOUNTER — Emergency Department (HOSPITAL_COMMUNITY): Payer: Medicare Other

## 2020-05-13 ENCOUNTER — Encounter (HOSPITAL_COMMUNITY): Payer: Self-pay

## 2020-05-13 DIAGNOSIS — E785 Hyperlipidemia, unspecified: Secondary | ICD-10-CM | POA: Diagnosis present

## 2020-05-13 DIAGNOSIS — K565 Intestinal adhesions [bands], unspecified as to partial versus complete obstruction: Principal | ICD-10-CM | POA: Diagnosis present

## 2020-05-13 DIAGNOSIS — I252 Old myocardial infarction: Secondary | ICD-10-CM | POA: Diagnosis not present

## 2020-05-13 DIAGNOSIS — S72114A Nondisplaced fracture of greater trochanter of right femur, initial encounter for closed fracture: Secondary | ICD-10-CM | POA: Diagnosis present

## 2020-05-13 DIAGNOSIS — R079 Chest pain, unspecified: Secondary | ICD-10-CM | POA: Diagnosis not present

## 2020-05-13 DIAGNOSIS — Z7983 Long term (current) use of bisphosphonates: Secondary | ICD-10-CM | POA: Diagnosis not present

## 2020-05-13 DIAGNOSIS — F0281 Dementia in other diseases classified elsewhere with behavioral disturbance: Secondary | ICD-10-CM | POA: Diagnosis present

## 2020-05-13 DIAGNOSIS — R1111 Vomiting without nausea: Secondary | ICD-10-CM | POA: Diagnosis not present

## 2020-05-13 DIAGNOSIS — G309 Alzheimer's disease, unspecified: Secondary | ICD-10-CM | POA: Diagnosis present

## 2020-05-13 DIAGNOSIS — Z833 Family history of diabetes mellitus: Secondary | ICD-10-CM

## 2020-05-13 DIAGNOSIS — N1831 Chronic kidney disease, stage 3a: Secondary | ICD-10-CM | POA: Diagnosis present

## 2020-05-13 DIAGNOSIS — M47816 Spondylosis without myelopathy or radiculopathy, lumbar region: Secondary | ICD-10-CM | POA: Diagnosis not present

## 2020-05-13 DIAGNOSIS — S72144A Nondisplaced intertrochanteric fracture of right femur, initial encounter for closed fracture: Secondary | ICD-10-CM | POA: Diagnosis not present

## 2020-05-13 DIAGNOSIS — Z8781 Personal history of (healed) traumatic fracture: Secondary | ICD-10-CM

## 2020-05-13 DIAGNOSIS — Z951 Presence of aortocoronary bypass graft: Secondary | ICD-10-CM | POA: Diagnosis not present

## 2020-05-13 DIAGNOSIS — M1990 Unspecified osteoarthritis, unspecified site: Secondary | ICD-10-CM | POA: Diagnosis present

## 2020-05-13 DIAGNOSIS — E1151 Type 2 diabetes mellitus with diabetic peripheral angiopathy without gangrene: Secondary | ICD-10-CM | POA: Diagnosis present

## 2020-05-13 DIAGNOSIS — E1122 Type 2 diabetes mellitus with diabetic chronic kidney disease: Secondary | ICD-10-CM | POA: Diagnosis present

## 2020-05-13 DIAGNOSIS — K5669 Other partial intestinal obstruction: Secondary | ICD-10-CM | POA: Diagnosis not present

## 2020-05-13 DIAGNOSIS — Z888 Allergy status to other drugs, medicaments and biological substances status: Secondary | ICD-10-CM

## 2020-05-13 DIAGNOSIS — R110 Nausea: Secondary | ICD-10-CM | POA: Diagnosis not present

## 2020-05-13 DIAGNOSIS — I2510 Atherosclerotic heart disease of native coronary artery without angina pectoris: Secondary | ICD-10-CM | POA: Diagnosis present

## 2020-05-13 DIAGNOSIS — Z90710 Acquired absence of both cervix and uterus: Secondary | ICD-10-CM

## 2020-05-13 DIAGNOSIS — K56609 Unspecified intestinal obstruction, unspecified as to partial versus complete obstruction: Secondary | ICD-10-CM | POA: Diagnosis present

## 2020-05-13 DIAGNOSIS — G308 Other Alzheimer's disease: Secondary | ICD-10-CM | POA: Diagnosis not present

## 2020-05-13 DIAGNOSIS — D849 Immunodeficiency, unspecified: Secondary | ICD-10-CM | POA: Diagnosis present

## 2020-05-13 DIAGNOSIS — K6389 Other specified diseases of intestine: Secondary | ICD-10-CM | POA: Diagnosis not present

## 2020-05-13 DIAGNOSIS — M25451 Effusion, right hip: Secondary | ICD-10-CM | POA: Diagnosis not present

## 2020-05-13 DIAGNOSIS — Z20822 Contact with and (suspected) exposure to covid-19: Secondary | ICD-10-CM | POA: Diagnosis present

## 2020-05-13 DIAGNOSIS — E876 Hypokalemia: Secondary | ICD-10-CM | POA: Diagnosis not present

## 2020-05-13 DIAGNOSIS — I129 Hypertensive chronic kidney disease with stage 1 through stage 4 chronic kidney disease, or unspecified chronic kidney disease: Secondary | ICD-10-CM | POA: Diagnosis present

## 2020-05-13 DIAGNOSIS — S72111A Displaced fracture of greater trochanter of right femur, initial encounter for closed fracture: Secondary | ICD-10-CM | POA: Diagnosis not present

## 2020-05-13 DIAGNOSIS — Z7982 Long term (current) use of aspirin: Secondary | ICD-10-CM | POA: Diagnosis not present

## 2020-05-13 DIAGNOSIS — R1110 Vomiting, unspecified: Secondary | ICD-10-CM | POA: Diagnosis not present

## 2020-05-13 DIAGNOSIS — Z0189 Encounter for other specified special examinations: Secondary | ICD-10-CM

## 2020-05-13 DIAGNOSIS — E7800 Pure hypercholesterolemia, unspecified: Secondary | ICD-10-CM | POA: Diagnosis present

## 2020-05-13 DIAGNOSIS — I10 Essential (primary) hypertension: Secondary | ICD-10-CM | POA: Diagnosis not present

## 2020-05-13 DIAGNOSIS — Z79899 Other long term (current) drug therapy: Secondary | ICD-10-CM | POA: Diagnosis not present

## 2020-05-13 DIAGNOSIS — E119 Type 2 diabetes mellitus without complications: Secondary | ICD-10-CM | POA: Diagnosis not present

## 2020-05-13 DIAGNOSIS — N183 Chronic kidney disease, stage 3 unspecified: Secondary | ICD-10-CM | POA: Diagnosis present

## 2020-05-13 DIAGNOSIS — F02818 Dementia in other diseases classified elsewhere, unspecified severity, with other behavioral disturbance: Secondary | ICD-10-CM | POA: Diagnosis present

## 2020-05-13 DIAGNOSIS — Z4682 Encounter for fitting and adjustment of non-vascular catheter: Secondary | ICD-10-CM | POA: Diagnosis not present

## 2020-05-13 LAB — COMPREHENSIVE METABOLIC PANEL
ALT: 12 U/L (ref 0–44)
AST: 27 U/L (ref 15–41)
Albumin: 4.2 g/dL (ref 3.5–5.0)
Alkaline Phosphatase: 69 U/L (ref 38–126)
Anion gap: 13 (ref 5–15)
BUN: 22 mg/dL (ref 8–23)
CO2: 27 mmol/L (ref 22–32)
Calcium: 9.6 mg/dL (ref 8.9–10.3)
Chloride: 102 mmol/L (ref 98–111)
Creatinine, Ser: 1.32 mg/dL — ABNORMAL HIGH (ref 0.44–1.00)
GFR, Estimated: 35 mL/min — ABNORMAL LOW (ref >60)
Glucose, Bld: 167 mg/dL — ABNORMAL HIGH (ref 70–99)
Potassium: 4.2 mmol/L (ref 3.5–5.1)
Sodium: 142 mmol/L (ref 135–145)
Total Bilirubin: 0.8 mg/dL (ref 0.3–1.2)
Total Protein: 7.8 g/dL (ref 6.5–8.1)

## 2020-05-13 LAB — URINALYSIS, ROUTINE W REFLEX MICROSCOPIC
Bacteria, UA: NONE SEEN
Bilirubin Urine: NEGATIVE
Glucose, UA: NEGATIVE mg/dL
Hgb urine dipstick: NEGATIVE
Ketones, ur: 5 mg/dL — AB
Leukocytes,Ua: NEGATIVE
Nitrite: NEGATIVE
Protein, ur: >=300 mg/dL — AB
Specific Gravity, Urine: 1.023 (ref 1.005–1.030)
pH: 6 (ref 5.0–8.0)

## 2020-05-13 LAB — CBC WITH DIFFERENTIAL/PLATELET
Abs Immature Granulocytes: 0.02 10*3/uL (ref 0.00–0.07)
Basophils Absolute: 0 10*3/uL (ref 0.0–0.1)
Basophils Relative: 0 %
Eosinophils Absolute: 0 10*3/uL (ref 0.0–0.5)
Eosinophils Relative: 0 %
HCT: 31.8 % — ABNORMAL LOW (ref 36.0–46.0)
Hemoglobin: 10 g/dL — ABNORMAL LOW (ref 12.0–15.0)
Immature Granulocytes: 0 %
Lymphocytes Relative: 5 %
Lymphs Abs: 0.6 10*3/uL — ABNORMAL LOW (ref 0.7–4.0)
MCH: 27 pg (ref 26.0–34.0)
MCHC: 31.4 g/dL (ref 30.0–36.0)
MCV: 85.9 fL (ref 80.0–100.0)
Monocytes Absolute: 0.7 10*3/uL (ref 0.1–1.0)
Monocytes Relative: 6 %
Neutro Abs: 10.6 10*3/uL — ABNORMAL HIGH (ref 1.7–7.7)
Neutrophils Relative %: 89 %
Platelets: 205 10*3/uL (ref 150–400)
RBC: 3.7 MIL/uL — ABNORMAL LOW (ref 3.87–5.11)
RDW: 17.2 % — ABNORMAL HIGH (ref 11.5–15.5)
WBC: 11.8 10*3/uL — ABNORMAL HIGH (ref 4.0–10.5)
nRBC: 0 % (ref 0.0–0.2)

## 2020-05-13 LAB — OCCULT BLOOD GASTRIC / DUODENUM (SPECIMEN CUP): Occult Blood, Gastric: POSITIVE — AB

## 2020-05-13 LAB — RESPIRATORY PANEL BY RT PCR (FLU A&B, COVID)
Influenza A by PCR: NEGATIVE
Influenza B by PCR: NEGATIVE
SARS Coronavirus 2 by RT PCR: NEGATIVE

## 2020-05-13 LAB — LIPASE, BLOOD: Lipase: 32 U/L (ref 11–51)

## 2020-05-13 LAB — LACTIC ACID, PLASMA: Lactic Acid, Venous: 1.2 mmol/L (ref 0.5–1.9)

## 2020-05-13 LAB — TROPONIN I (HIGH SENSITIVITY)
Troponin I (High Sensitivity): 12 ng/L (ref <18)
Troponin I (High Sensitivity): 9 ng/L (ref <18)

## 2020-05-13 MED ORDER — MORPHINE SULFATE (PF) 2 MG/ML IV SOLN
1.0000 mg | INTRAVENOUS | Status: DC | PRN
  Administered 2020-05-13 – 2020-05-14 (×2): 1 mg via INTRAVENOUS
  Filled 2020-05-13 (×2): qty 1

## 2020-05-13 MED ORDER — HYDRALAZINE HCL 20 MG/ML IJ SOLN
5.0000 mg | Freq: Once | INTRAMUSCULAR | Status: AC
  Administered 2020-05-13: 5 mg via INTRAVENOUS
  Filled 2020-05-13: qty 1

## 2020-05-13 MED ORDER — ONDANSETRON HCL 4 MG/2ML IJ SOLN: 4.0000 mg | Freq: Four times a day (QID) | INTRAMUSCULAR | Status: DC | PRN

## 2020-05-13 MED ORDER — HYDRALAZINE HCL 20 MG/ML IJ SOLN
5.0000 mg | Freq: Once | INTRAMUSCULAR | Status: AC
  Filled 2020-05-13: qty 1

## 2020-05-13 MED ORDER — ACETAMINOPHEN 325 MG PO TABS: 650.0000 mg | ORAL_TABLET | Freq: Four times a day (QID) | ORAL | Status: DC | PRN

## 2020-05-13 MED ORDER — IOHEXOL 300 MG/ML  SOLN
75.0000 mL | Freq: Once | INTRAMUSCULAR | Status: AC | PRN
  Administered 2020-05-13: 75 mL via INTRAVENOUS

## 2020-05-13 MED ORDER — SODIUM CHLORIDE 0.9 % IV SOLN: Freq: Once | INTRAVENOUS | Status: AC

## 2020-05-13 MED ORDER — ACETAMINOPHEN 650 MG RE SUPP: 650.0000 mg | Freq: Four times a day (QID) | RECTAL | Status: DC | PRN

## 2020-05-13 MED ORDER — HYDRALAZINE HCL 20 MG/ML IJ SOLN
INTRAMUSCULAR | Status: AC
  Administered 2020-05-13: 5 mg via INTRAVENOUS
  Filled 2020-05-13: qty 1

## 2020-05-13 MED ORDER — HEPARIN SODIUM (PORCINE) 5000 UNIT/ML IJ SOLN
5000.0000 [IU] | Freq: Three times a day (TID) | INTRAMUSCULAR | Status: DC
  Administered 2020-05-13 – 2020-05-16 (×9): 5000 [IU] via SUBCUTANEOUS
  Filled 2020-05-13 (×9): qty 1

## 2020-05-13 MED ORDER — METOPROLOL TARTRATE 5 MG/5ML IV SOLN
5.0000 mg | Freq: Four times a day (QID) | INTRAVENOUS | Status: DC
  Administered 2020-05-13 – 2020-05-15 (×6): 5 mg via INTRAVENOUS
  Filled 2020-05-13 (×6): qty 5

## 2020-05-13 MED ORDER — ONDANSETRON HCL 4 MG PO TABS: 4.0000 mg | ORAL_TABLET | Freq: Four times a day (QID) | ORAL | Status: DC | PRN

## 2020-05-13 MED ORDER — SODIUM CHLORIDE (PF) 0.9 % IJ SOLN
INTRAMUSCULAR | Status: AC
  Filled 2020-05-13: qty 50

## 2020-05-13 NOTE — Progress Notes (Addendum)
Ambulatory 86 year old patient admitted to the hospital for small bowel obstruction noted to have greater trochanteric fracture on CT abdomen pelvis  She has been weightbearing without difficulty  CT scan is reviewed and shows greater trochanteric fracture with some extension towards the lesser trochanter.  Plan nonweightbearing for now with clinical evaluation early a.m.  This could be a difficult operative decision if she is clinically asymptomatic but does have radiographic areas of concern about fracture.  Plan thin cut CT scan tonight to aid in surgical decision making.  Pelvic MRI would be the ideal study for this but the MRI scan at [facility] long is not functional at this time.

## 2020-05-13 NOTE — ED Notes (Signed)
Attempted to call husband at home phone number once and cell number twice w/ no answer.

## 2020-05-13 NOTE — ED Provider Notes (Signed)
[facility]-EMERGENCY DEPT Provider Note   CSN: [id number] Arrival date & time: 05/13/20  0744     History Chief Complaint  Patient presents with   Emesis    Sonya Chavez is a 86 y.o. female.  86 year old female with history of dementia, CAD, MI, HTN, DM, CKD III, brought in by EMS from home where she lives with her husband for nausea and vomiting for the past few days. Patient is a difficult historian, answers "a little bit" to diarrhea, chest pain, shortness of breath. Reports decreased urine out put, denies fevers, sick contacts. Level 5 caveat due to dementia.         Past Medical History:  Diagnosis Date   Arthritis    "qwhere"   Coronary artery disease    Hypercholesteremia    Hypertension    Myocardial infarction [facility]) 2003   SBO (small bowel obstruction) (HCC) 12/18/2016   Stenosis of subclavian artery (HCC)    Type II diabetes mellitus (HCC)     Patient Active Problem List   Diagnosis Date Noted   DNR (do not resuscitate) 10/07/2019   Alzheimer's dementia with behavioral disturbance (HCC)    Palliative care encounter    Failure to thrive (0-17) 10/06/2019   Weakness generalized 10/05/2019   Hypernatremia 07/18/2019   Hypokalemia 07/18/2019   Elevated troponin 07/17/2019   Acute lower UTI 07/17/2019   Positive D dimer 07/17/2019   Normocytic anemia 07/17/2019   Cholelithiasis 01/02/2019   CKD (chronic kidney disease), stage III (HCC) 10/21/2017   Delirium due to another medical condition 12/18/2016   SBO (small bowel obstruction) (HCC) 12/18/2016   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   Leukocytosis 12/18/2016   Peripheral artery disease (HCC) 11/08/2014   PAD (peripheral artery disease) (HCC) 10/24/2014   LBBB (left bundle branch block) 09/27/2014    Class: Chronic   Subclavian artery stenosis, left (HCC) 09/27/2014   Numbness and tingling in left hand 03/08/2013   HTN (hypertension)  03/08/2013   DM (diabetes mellitus) (HCC) 03/08/2013   CAD (coronary artery disease) 03/08/2013    Past Surgical History:  Procedure Laterality Date   ABDOMINAL HYSTERECTOMY  1970   BOWEL RESECTION N/A 12/20/2016   Procedure: SMALL BOWEL RESECTION;  Surgeon: Abigail Miyamoto, MD;  Location[facility] OR;  Service: General;  Laterality: N/A;   CARDIAC CATHETERIZATION     CATARACT EXTRACTION Bilateral 2000's   CORONARY ARTERY BYPASS GRAFT  2003   in [place], [place]   LAPAROTOMY N/A 12/20/2016   Procedure: EXPLORATORY LAPAROTOMY;  Surgeon: Abigail Miyamoto, MD;  Location: [facility] OR;  Service: General;  Laterality: N/A;   LEFT HEART CATHETERIZATION WITH CORONARY/GRAFT ANGIOGRAM N/A 09/27/2014   Procedure: LEFT HEART CATHETERIZATION WITH Isabel Caprice;  Surgeon: Pamella Pert, MD;  Location: [facility] CATH LAB;  Service: Cardiovascular;  Laterality: N/A;   UNILATERAL UPPER EXTREMEITY ANGIOGRAM N/A 09/27/2014   Procedure: SUBCLAVIAN ARTERIOGRAM ;  Surgeon: Pamella Pert, MD;  Location: [facility] CATH LAB;  Service: Cardiovascular;  Laterality: N/A;   UNILATERAL UPPER EXTREMEITY ANGIOGRAM N/A 11/09/2014   Procedure: Left subclavian stent ;  Surgeon: Yates Decamp, MD;  Location: [facility] CATH LAB;  Service: Cardiovascular;  Laterality: N/A;   VISCERAL ANGIOGRAM N/A 10/25/2014   Procedure: Laurence Slate;  Surgeon: Yates Decamp, MD;  Location: [facility] CATH LAB;  Service: Cardiovascular;  Laterality: N/A;     OB History   No obstetric history on file.     Family History  Problem Relation Age of Onset  Diabetes Father    Gallbladder disease Neg Hx     Social History   Tobacco Use   Smoking status: Never Smoker   Smokeless tobacco: Never Used  Vaping Use   Vaping Use: Never used  Substance Use Topics   Alcohol use: No   Drug use: No    Home Medications Prior to Admission medications   Medication Sig Start Date End Date Taking? Authorizing Provider  acetaminophen (TYLENOL) 325 MG  tablet Take 2 tablets (650 mg total) by mouth every 4 (four) hours as needed for headache or mild pain. 10/11/19   Drema DallasWoods, Curtis J, MD  alendronate (FOSAMAX) 70 MG tablet Take 70 mg by mouth every Monday.  12/08/14   [provider]  ALPRAZolam Prudy Feeler(XANAX) 0.5 MG tablet Take 0.5 mg by mouth 3 (three) times daily as needed. 10/01/19   [provider]  amLODipine (NORVASC) 10 MG tablet Take 10 mg by mouth at bedtime.  10/12/14   [provider]  aspirin EC 81 MG tablet Take 81 mg by mouth at bedtime.     [provider]  carvedilol (COREG) 6.25 MG tablet Take 12.5 mg by mouth 2 (two) times daily. 10/14/18   [provider]  Cholecalciferol (VITAMIN D3) 50 MCG (2000 UT) TABS Take 1 tablet by mouth daily.    [provider]  Dextrose-Sodium Chloride (DEXTROSE 5 % AND 0.45% NACL) infusion Inject 75 mLs into the vein continuous. 10/11/19   Drema DallasWoods, Curtis J, MD  diclofenac Sodium (VOLTAREN) 1 % GEL Apply 4 g topically 4 (four) times daily. 10/11/19   Drema DallasWoods, Curtis J, MD  gabapentin (NEURONTIN) 300 MG capsule Take 600 mg by mouth at bedtime.  04/09/17   [provider]  hydrALAZINE (APRESOLINE) 100 MG tablet Take 1 tablet (100 mg total) by mouth 3 (three) times daily. 10/11/19   Drema DallasWoods, Curtis J, MD  lovastatin (MEVACOR) 40 MG tablet Take 1 tablet (40 mg total) by mouth every evening. 08/27/19   Toniann FailKelley, Ashton Haynes, NP  pantoprazole (PROTONIX) 40 MG tablet Take 40 mg by mouth daily. 07/04/19   [provider]  triamcinolone cream (KENALOG) 0.1 % Apply 1 application topically 2 (two) times daily. 06/28/19   [provider]    Allergies    Statins, Other, Crestor [rosuvastatin], Lipitor [atorvastatin], and Zocor [simvastatin]  Review of Systems   Review of Systems  Unable to perform ROS: Dementia  Gastrointestinal: Positive for nausea and vomiting.  Allergic/Immunologic: Positive for immunocompromised state.    Physical Exam Updated Vital  Signs BP (!) 191/62 (BP Location: Right Arm)    Pulse 74    Temp 97.7 F (36.5 C) (Oral)    Resp 18    Ht 5\' 5"  (1.651 m)    Wt 53.1 kg    SpO2 95%    BMI 19.48 kg/m   Physical Exam Vitals and nursing note reviewed.  Constitutional:      General: She is not in acute distress.    Appearance: She is well-developed. She is not toxic-appearing or diaphoretic.     Comments: Frail appearing, non toxic  HENT:     Head: Normocephalic and atraumatic.     Mouth/Throat:     Mouth: Mucous membranes are dry.  Cardiovascular:     Rate and Rhythm: Normal rate and regular rhythm.     Pulses: Normal pulses.     Heart sounds: No murmur heard.   Pulmonary:     Effort: Pulmonary effort is normal.  Breath sounds: Normal breath sounds.  Abdominal:     Palpations: Abdomen is soft.     Tenderness: There is abdominal tenderness in the left upper quadrant and left lower quadrant.  Musculoskeletal:     Right lower leg: No edema.     Left lower leg: No edema.  Skin:    General: Skin is warm and dry.     Findings: No erythema or rash.  Neurological:     Mental Status: She is alert.     Comments: Oriented to self  Psychiatric:        Behavior: Behavior normal.     ED Results / Procedures / Treatments   Labs (all labs ordered are listed, but only abnormal results are displayed) Labs Reviewed  CBC WITH DIFFERENTIAL/PLATELET - Abnormal; Notable for the following components:      Result Value   WBC 11.8 (*)    RBC 3.70 (*)    Hemoglobin 10.0 (*)    HCT 31.8 (*)    RDW 17.2 (*)    Neutro Abs 10.6 (*)    Lymphs Abs 0.6 (*)    All other components within normal limits  COMPREHENSIVE METABOLIC PANEL - Abnormal; Notable for the following components:   Glucose, Bld 167 (*)    Creatinine, Ser 1.32 (*)    GFR, Estimated 35 (*)    All other components within normal limits  URINALYSIS, ROUTINE W REFLEX MICROSCOPIC - Abnormal; Notable for the following components:   Ketones, ur 5 (*)    Protein,  ur >=300 (*)    All other components within normal limits  OCCULT BLOOD GASTRIC / DUODENUM (SPECIMEN CUP) - Abnormal; Notable for the following components:   Occult Blood, Gastric POSITIVE (*)    All other components within normal limits  RESPIRATORY PANEL BY RT PCR (FLU A&B, COVID)  LIPASE, BLOOD  LACTIC ACID, PLASMA  TROPONIN I (HIGH SENSITIVITY)  TROPONIN I (HIGH SENSITIVITY)    EKG None  Radiology DG Abdomen 1 View  Result Date: 05/13/2020 CLINICAL DATA:  Evaluate NG tube placement. EXAM: ABDOMEN - 1 VIEW COMPARISON:  Abdominal radiograph 05/13/2020. FINDINGS: Enteric tube tip and side-port project over the left upper quadrant. Multiple monitoring leads overlie the patient. Patchy opacities lung bases bilaterally. Contrast within the urinary bladder. Mild gaseous distended loops of bowel within the central abdomen. Lumbar spine degenerative changes. IMPRESSION: Enteric tube tip and side-port project over the left upper quadrant. Electronically Signed   By: Annia Belt M.D.   On: 05/13/2020 14:03   CT Abdomen Pelvis W Contrast  Result Date: 05/13/2020 CLINICAL DATA:  Nausea vomiting EXAM: CT ABDOMEN AND PELVIS WITH CONTRAST TECHNIQUE: Multidetector CT imaging of the abdomen and pelvis was performed using the standard protocol following bolus administration of intravenous contrast. CONTRAST:  75mL OMNIPAQUE IOHEXOL 300 MG/ML  SOLN COMPARISON:  July 17, 2019 FINDINGS: Lower chest: No acute abnormality. Hepatobiliary: Markedly thickened gallbladder wall with a complex partially calcified gallstone, unchanged in comparison to prior. Unchanged prominence of the common bile duct measuring up to 7 mm. No new focal hepatic abnormality. Pancreas: Atrophic in appearance. No new peripancreatic fat stranding. Spleen: Unchanged hypoenhancing mass of the superomedial spleen, likely a benign lesion. Adrenals/Urinary Tract: Adrenals are unremarkable. Kidneys enhance symmetrically. No hydronephrosis.  Subcentimeter hypodense lesions are too small to accurately characterize. Bladder is unremarkable. Stomach/Bowel: There is dilation of the stomach and proximal duodenum, similar in comparison to prior. There is mild dilation of multiple loops of small bowel with  decompression of distal loops of small bowel and colon. Transition point is likely in the mid abdomen (series 47, image 81). The configuration of bowel is unchanged comparison to priors. Severe diverticulosis of the sigmoid rectal colon. No pneumatosis or portal venous gas. Vascular/Lymphatic: Severe atherosclerotic calcifications. No new suspicious lymphadenopathy. Reproductive: Status post hysterectomy. Other: There is asymmetric enhancement in the LEFT breast with an 11 mm area of enhancement in central outer LEFT breast (series 2, image 21). Musculoskeletal: Age-indeterminate fracture of the RIGHT greater trochanter. There is a fracture line extending into the inter trochanteric femur (series 5, image 70). Grade 1 anterolisthesis of L4-5. IMPRESSION: 1. Small-bowel obstruction with transition point likely in the mid abdomen. No pneumatosis or portal venous gas. Configuration of obstruction is similar in comparison to previous small bowel obstruction in December 2020. 2. Age-indeterminate fracture of the RIGHT greater trochanter. There is a likely fracture line extending into the inter trochanteric femur. Consider further evaluation with a dedicated hip radiographs. 3. Asymmetric nodular enhancement in the LEFT breast. Recommend correlation with physical exam or mammographic history if clinically indicated. 4. Markedly thickened gallbladder wall with a complex partially calcified gallstone, unchanged in comparison to prior study. These results were called by telephone at the time of interpretation on 05/13/2020 at 12:00 pm to provider [facility] , who verbally acknowledged these results. Electronically Signed   By: Meda Klinefelter MD   On: 05/13/2020  12:05   DG Chest Port 1 View  Result Date: 05/13/2020 CLINICAL DATA:  Chest pain EXAM: PORTABLE CHEST 1 VIEW COMPARISON:  October 05, 2019 FINDINGS: The mediastinal contour and cardiac silhouette are stable. Patient status post prior the the [employer] and CABG. There is no focal infiltrate, pulmonary edema, or pleural effusion. Bony structures are unremarkable. IMPRESSION: No acute cardiopulmonary disease identified. Electronically Signed   By: Sherian Rein M.D.   On: 05/13/2020 09:49   DG Hip Unilat With Pelvis 2-3 Views Right  Result Date: 05/13/2020 CLINICAL DATA:  Fracture EXAM: DG HIP (WITH OR WITHOUT PELVIS) 2-3V RIGHT COMPARISON:  Same day CT FINDINGS: Revisualization of a nondisplaced fracture of the RIGHT greater trochanter. Fracture line extends into the inter trochanteric proximal RIGHT femur. Osteopenia. Excreted contrast within the renal collecting system. Vascular calcifications. IMPRESSION: Revisualization of a nondisplaced fracture of the RIGHT greater trochanter with fracture line likely extending into the intertrochanteric femur. Evaluation for additional subtle fractures could be accomplished with a dedicated pelvic MRI if clinically desired. Electronically Signed   By: Meda Klinefelter MD   On: 05/13/2020 14:06    Procedures Procedures (including critical care time)  Medications Ordered in ED Medications  sodium chloride (PF) 0.9 % injection (  Not Given 05/13/20 1206)  hydrALAZINE (APRESOLINE) injection 5 mg (has no administration in time range)  iohexol (OMNIPAQUE) 300 MG/ML solution 75 mL (75 mLs Intravenous Contrast Given 05/13/20 1132)  hydrALAZINE (APRESOLINE) injection 5 mg (5 mg Intravenous Given 05/13/20 1304)    ED Course  I have reviewed the triage vital signs and the nursing notes.  Pertinent labs & imaging results that were available during my care of the patient were reviewed by me and considered in my medical decision making (see chart for details).  Clinical  Course as of May 13 1449  Sat May 13, 2020  6991 86 year old female with history of dementia brought in by EMS from home for nausea and vomiting.  Patient is not a reliable historian.  Patient has mild generalized abdominal tenderness on exam with  dry mucous membranes. Patient was given IV fluids and Zofran, labs obtained, CBC with white blood cell count 11.8, baseline anemia.  CMP without significant findings, known chronic kidney disease.  Urinalysis positive for ketones and protein, no UTI.  Lactic acid within normal limits at 1.2.  Troponin x2 unchanged.  Lipase within normal meds.  Covid test negative. Patient had CT abdomen pelvis which shows small bowel obstruction and known gallbladder disease.  Discussed with radiologist, findings prior to her last CT which was completed in December 2020 when she was admitted for SBO.  Radiologist mentions concern for right greater trochanter fracture.  Reassessment of hips, patient reports mild pain with range of motion of both of her legs, no tenderness with palpation of her pelvis or proximal femurs. Plan is to add on x-ray right hip. Patient did have 1 episode of emesis while in the ER today, reported by patient's nurse to be brown stool in appearance. NG tube was ordered as well as consult with hospitalist for admission and general surgery. Review of prior records from December 2020 admission, patient's SBO was managed with NG tube at that time. Case discussed with Dr. Ronaldo Miyamoto with [facility] hospitalist service will consult for admission, requests management of patient's hypertension, IV hydralazine ordered. Consult with general surgery pending.   [LM]  1327 Case discussed with Dr. Carolynne Edouard with general surgery who will consult tomorrow.    [LM]  1428 XR with right greater trochanter fracture. Discussed with Dr. August Saucer with [facility] who requests MRI of the pelvis and call back with results.     [LM]  1446 Discussed MRI plan with Dr. Ronaldo Miyamoto who will follow up  with ortho with results.    [LM]    Clinical Course User Index [LM] Alden Hipp   MDM Rules/Calculators/A&P                          Final Clinical Impression(s) / ED Diagnoses Final diagnoses:  Small bowel obstruction (HCC)  Closed nondisplaced intertrochanteric fracture of right femur, initial encounter [facility])    Rx / DC Orders ED Discharge Orders    None       Jeannie Fend, PA-C 05/13/20 1450    Tegeler, Canary Brim, MD 05/13/20 2040

## 2020-05-13 NOTE — ED Notes (Signed)
Called report to Susan, RN.

## 2020-05-13 NOTE — Consult Note (Signed)
Reason for Consult:vomiting Referring Physician: Dr. Kyle  [place] Sonya Chavez is an 86 y.o. female.  HPI: The patient is a 86 year old black female who presents with a couple day history of nausea and vomiting.  She has apparently had some abdominal pain at home.  The family states this is the second or third time this is happened to her in the last year or so.  She is very comfortable now.  She has no complaints.  She came to the emergency department where a CT scan showed a small bowel obstruction.  Past Medical History:  Diagnosis Date  . Arthritis    "qwhere"  . Coronary artery disease   . Hypercholesteremia   . Hypertension   . Myocardial infarction (HCC) 2003  . SBO (small bowel obstruction) (HCC) 12/18/2016  . Stenosis of subclavian artery (HCC)   . Type II diabetes mellitus (HCC)     Past Surgical History:  Procedure Laterality Date  . ABDOMINAL HYSTERECTOMY  1970  . BOWEL RESECTION N/A 12/20/2016   Procedure: SMALL BOWEL RESECTION;  Surgeon: Abigail Miyamoto, MD;  Location: [facility] OR;  Service: General;  Laterality: N/A;  . CARDIAC CATHETERIZATION    . CATARACT EXTRACTION Bilateral 2000's  . CORONARY ARTERY BYPASS GRAFT  2003   in [place], [place]  . LAPAROTOMY N/A 12/20/2016   Procedure: EXPLORATORY LAPAROTOMY;  Surgeon: Abigail Miyamoto, MD;  Location: [facility] OR;  Service: General;  Laterality: N/A;  . LEFT HEART CATHETERIZATION WITH CORONARY/GRAFT ANGIOGRAM N/A 09/27/2014   Procedure: LEFT HEART CATHETERIZATION WITH Isabel Caprice;  Surgeon: Pamella Pert, MD;  Location: [facility] CATH LAB;  Service: Cardiovascular;  Laterality: N/A;  . UNILATERAL UPPER EXTREMEITY ANGIOGRAM N/A 09/27/2014   Procedure: SUBCLAVIAN ARTERIOGRAM ;  Surgeon: Pamella Pert, MD;  Location: [facility] CATH LAB;  Service: Cardiovascular;  Laterality: N/A;  . UNILATERAL UPPER EXTREMEITY ANGIOGRAM N/A 11/09/2014   Procedure: Left subclavian stent ;  Surgeon: Yates Decamp, MD;  Location: [facility] CATH LAB;   Service: Cardiovascular;  Laterality: N/A;  . VISCERAL ANGIOGRAM N/A 10/25/2014   Procedure: Laurence Slate;  Surgeon: Yates Decamp, MD;  Location: [facility] CATH LAB;  Service: Cardiovascular;  Laterality: N/A;    Family History  Problem Relation Age of Onset  . Diabetes Father   . Gallbladder disease Neg Hx     Social History:  reports that she has never smoked. She has never used smokeless tobacco. She reports that she does not drink alcohol and does not use drugs.  Allergies:  Allergies  Allergen Reactions  . Statins Other (See Comments)    Myalgias   . Other     NO FOODS WITH SEEDS  . Crestor [Rosuvastatin] Other (See Comments)    Myalgias  . Lipitor [Atorvastatin] Other (See Comments)    myalgias  . Zocor [Simvastatin] Other (See Comments)    myalgias    Medications: I have reviewed the patient's current medications.  Results for orders placed or performed during the hospital encounter of 05/13/20 (from the past 48 hour(s))  CBC with Differential     Status: Abnormal   Collection Time: 05/13/20  8:24 AM  Result Value Ref Range   WBC 11.8 (H) 4.0 - 10.5 K/uL   RBC 3.70 (L) 3.87 - 5.11 MIL/uL   Hemoglobin 10.0 (L) 12.0 - 15.0 g/dL   HCT 33.8 (L) 36 - 46 %   MCV 85.9 80.0 - 100.0 fL   MCH 27.0 26.0 - 34.0 pg   MCHC 31.4 30.0 - 36.0  g/dL   RDW 84.117.2 (H) 32.411.5 - 40.115.5 %   Platelets 205 150 - 400 K/uL   nRBC 0.0 0.0 - 0.2 %   Neutrophils Relative % 89 %   Neutro Abs 10.6 (H) 1.7 - 7.7 K/uL   Lymphocytes Relative 5 %   Lymphs Abs 0.6 (L) 0.7 - 4.0 K/uL   Monocytes Relative 6 %   Monocytes Absolute 0.7 0.1 - 1.0 K/uL   Eosinophils Relative 0 %   Eosinophils Absolute 0.0 0 - 0 K/uL   Basophils Relative 0 %   Basophils Absolute 0.0 0 - 0 K/uL   Immature Granulocytes 0 %   Abs Immature Granulocytes 0.02 0.00 - 0.07 K/uL    Comment: Performed at [facility], [address] [place], [place] [address]  Comprehensive metabolic panel     Status: Abnormal    Collection Time: 05/13/20  8:24 AM  Result Value Ref Range   Sodium 142 135 - 145 mmol/L   Potassium 4.2 3.5 - 5.1 mmol/L   Chloride 102 98 - 111 mmol/L   CO2 27 22 - 32 mmol/L   Glucose, Bld 167 (H) 70 - 99 mg/dL    Comment: Glucose reference range applies only to samples taken after fasting for at least 8 hours.   BUN 22 8 - 23 mg/dL   Creatinine, Ser 3.661.32 (H) 0.44 - 1.00 mg/dL   Calcium 9.6 8.9 - 44.010.3 mg/dL   Total Protein 7.8 6.5 - 8.1 g/dL   Albumin 4.2 3.5 - 5.0 g/dL   AST 27 15 - 41 U/L   ALT 12 0 - 44 U/L   Alkaline Phosphatase 69 38 - 126 U/L   Total Bilirubin 0.8 0.3 - 1.2 mg/dL   GFR, Estimated 35 (L) >60 mL/min   Anion gap 13 5 - 15    Comment: Performed at [facility], [address] [place], [place] [address]  Lipase, blood     Status: None   Collection Time: 05/13/20  8:24 AM  Result Value Ref Range   Lipase 32 11 - 51 U/L    Comment: Performed at [facility], [address] [place], [place] [address]  Urinalysis, Routine w reflex microscopic Urine, Catheterized     Status: Abnormal   Collection Time: 05/13/20  8:24 AM  Result Value Ref Range   Color, Urine YELLOW YELLOW   APPearance CLEAR CLEAR   Specific Gravity, Urine 1.023 1.005 - 1.030   pH 6.0 5.0 - 8.0   Glucose, UA NEGATIVE NEGATIVE mg/dL   Hgb urine dipstick NEGATIVE NEGATIVE   Bilirubin Urine NEGATIVE NEGATIVE   Ketones, ur 5 (A) NEGATIVE mg/dL   Protein, ur >=875>=300 (A) NEGATIVE mg/dL   Nitrite NEGATIVE NEGATIVE   Leukocytes,Ua NEGATIVE NEGATIVE   RBC / HPF 0-5 0 - 5 RBC/hpf   WBC, UA 0-5 0 - 5 WBC/hpf   Bacteria, UA NONE SEEN NONE SEEN   Squamous Epithelial / LPF 0-5 0 - 5   Hyaline Casts, UA PRESENT     Comment: Performed at [facility], [address] [place], [place] [address]  Troponin I (High Sensitivity)     Status: None   Collection Time: 05/13/20  8:24 AM  Result Value Ref Range   Troponin I (High Sensitivity) 12 <18 ng/L     Comment: (NOTE) Elevated high sensitivity troponin I (hsTnI) values and significant  changes across serial measurements may suggest ACS but many other  chronic and acute conditions are known to elevate hsTnI results.  Refer to the "Links" section for chest pain algorithms and additional  guidance. Performed at [facility], [address] [place] [address], [place], [place] [address]   Lactic acid, plasma     Status: None   Collection Time: 05/13/20  8:25 AM  Result Value Ref Range   Lactic Acid, Venous 1.2 0.5 - 1.9 mmol/L    Comment: Performed at [facility], [address] [place], [place] [address]  Troponin I (High Sensitivity)     Status: None   Collection Time: 05/13/20 10:32 AM  Result Value Ref Range   Troponin I (High Sensitivity) 9 <18 ng/L    Comment: (NOTE) Elevated high sensitivity troponin I (hsTnI) values and significant  changes across serial measurements may suggest ACS but many other  chronic and acute conditions are known to elevate hsTnI results.  Refer to the "Links" section for chest pain algorithms and additional  guidance. Performed at [facility], [address] [place], [place] [address]   Occult bld gastric/duodenum (cup to lab)     Status: Abnormal   Collection Time: 05/13/20 11:37 AM  Result Value Ref Range   Occult Blood, Gastric POSITIVE (A) NEGATIVE    Comment: Performed at [facility], [address], [place], [place] [address]  Respiratory Panel by RT PCR (Flu A&B, Covid) - Nasopharyngeal Swab     Status: None   Collection Time: 05/13/20 11:44 AM   Specimen: Nasopharyngeal Swab  Result Value Ref Range   SARS Coronavirus 2 by RT PCR NEGATIVE NEGATIVE    Comment: (NOTE) SARS-CoV-2 target nucleic acids are NOT DETECTED.  The SARS-CoV-2 RNA is generally detectable in upper respiratoy specimens during the acute phase of infection. The lowest concentration of SARS-CoV-2 viral  copies this assay can detect is 131 copies/mL. A negative result does not preclude SARS-Cov-2 infection and should not be used as the sole basis for treatment or other patient management decisions. A negative result may occur with  improper specimen collection/handling, submission of specimen other than nasopharyngeal swab, presence of viral mutation(s) within the areas targeted by this assay, and inadequate number of viral copies (<131 copies/mL). A negative result must be combined with clinical observations, patient history, and epidemiological information. The expected result is Negative.  Fact Sheet for Patients:  https://www.moore.com/  Fact Sheet for Healthcare Providers:  https://www.young.biz/  This test is no t yet approved or cleared by the [place] FDA and  has been authorized for detection and/or diagnosis of SARS-CoV-2 by FDA under an Emergency Use Authorization (EUA). This EUA will remain  in effect (meaning this test can be used) for the duration of the COVID-19 declaration under Section 564(b)(1) of the Act, 21 U.S.C. section 360bbb-3(b)(1), unless the authorization is terminated or revoked sooner.     Influenza A by PCR NEGATIVE NEGATIVE   Influenza B by PCR NEGATIVE NEGATIVE    Comment: (NOTE) The Xpert Xpress SARS-CoV-2/FLU/RSV assay is intended as an aid in  the diagnosis of influenza from Nasopharyngeal swab specimens and  should not be used as a sole basis for treatment. Nasal washings and  aspirates are unacceptable for Xpert Xpress SARS-CoV-2/FLU/RSV  testing.  Fact Sheet for Patients: https://www.moore.com/  Fact Sheet for Healthcare Providers: https://www.young.biz/  This test is not yet approved or cleared by the [place] FDA and  has been authorized for detection and/or diagnosis of SARS-CoV-2 by  FDA under an  Emergency Use Authorization (EUA). This EUA will  remain  in effect (meaning this test can be used) for the duration of the  Covid-19 declaration under Section 564(b)(1) of the Act, 21  U.S.C. section 360bbb-3(b)(1), unless the authorization is  terminated or revoked. Performed at [facility], [address] [place], [place] [address]     DG Abdomen 1 View  Result Date: 05/13/2020 CLINICAL DATA:  Evaluate NG tube placement. EXAM: ABDOMEN - 1 VIEW COMPARISON:  Abdominal radiograph 05/13/2020. FINDINGS: Enteric tube tip and side-port project over the left upper quadrant. Multiple monitoring leads overlie the patient. Patchy opacities lung bases bilaterally. Contrast within the urinary bladder. Mild gaseous distended loops of bowel within the central abdomen. Lumbar spine degenerative changes. IMPRESSION: Enteric tube tip and side-port project over the left upper quadrant. Electronically Signed   By: Annia Belt M.D.   On: 05/13/2020 14:03   CT Abdomen Pelvis W Contrast  Result Date: 05/13/2020 CLINICAL DATA:  Nausea vomiting EXAM: CT ABDOMEN AND PELVIS WITH CONTRAST TECHNIQUE: Multidetector CT imaging of the abdomen and pelvis was performed using the standard protocol following bolus administration of intravenous contrast. CONTRAST:  75mL OMNIPAQUE IOHEXOL 300 MG/ML  SOLN COMPARISON:  July 17, 2019 FINDINGS: Lower chest: No acute abnormality. Hepatobiliary: Markedly thickened gallbladder wall with a complex partially calcified gallstone, unchanged in comparison to prior. Unchanged prominence of the common bile duct measuring up to 7 mm. No new focal hepatic abnormality. Pancreas: Atrophic in appearance. No new peripancreatic fat stranding. Spleen: Unchanged hypoenhancing mass of the superomedial spleen, likely a benign lesion. Adrenals/Urinary Tract: Adrenals are unremarkable. Kidneys enhance symmetrically. No hydronephrosis. Subcentimeter hypodense lesions are too small to accurately characterize. Bladder is unremarkable.  Stomach/Bowel: There is dilation of the stomach and proximal duodenum, similar in comparison to prior. There is mild dilation of multiple loops of small bowel with decompression of distal loops of small bowel and colon. Transition point is likely in the mid abdomen (series 47, image 81). The configuration of bowel is unchanged comparison to priors. Severe diverticulosis of the sigmoid rectal colon. No pneumatosis or portal venous gas. Vascular/Lymphatic: Severe atherosclerotic calcifications. No new suspicious lymphadenopathy. Reproductive: Status post hysterectomy. Other: There is asymmetric enhancement in the LEFT breast with an 11 mm area of enhancement in central outer LEFT breast (series 2, image 21). Musculoskeletal: Age-indeterminate fracture of the RIGHT greater trochanter. There is a fracture line extending into the inter trochanteric femur (series 5, image 70). Grade 1 anterolisthesis of L4-5. IMPRESSION: 1. Small-bowel obstruction with transition point likely in the mid abdomen. No pneumatosis or portal venous gas. Configuration of obstruction is similar in comparison to previous small bowel obstruction in December 2020. 2. Age-indeterminate fracture of the RIGHT greater trochanter. There is a likely fracture line extending into the inter trochanteric femur. Consider further evaluation with a dedicated hip radiographs. 3. Asymmetric nodular enhancement in the LEFT breast. Recommend correlation with physical exam or mammographic history if clinically indicated. 4. Markedly thickened gallbladder wall with a complex partially calcified gallstone, unchanged in comparison to prior study. These results were called by telephone at the time of interpretation on 05/13/2020 at 12:00 pm to provider [facility] , who verbally acknowledged these results. Electronically Signed   By: Meda Klinefelter MD   On: 05/13/2020 12:05   DG Chest Port 1 View  Result Date: 05/13/2020 CLINICAL DATA:  Chest pain EXAM: PORTABLE  CHEST 1 VIEW COMPARISON:  October 05, 2019 FINDINGS: The mediastinal contour and cardiac  silhouette are stable. Patient status post prior the the [employer] and CABG. There is no focal infiltrate, pulmonary edema, or pleural effusion. Bony structures are unremarkable. IMPRESSION: No acute cardiopulmonary disease identified. Electronically Signed   By: Sherian Rein M.D.   On: 05/13/2020 09:49   DG Hip Unilat With Pelvis 2-3 Views Right  Result Date: 05/13/2020 CLINICAL DATA:  Fracture EXAM: DG HIP (WITH OR WITHOUT PELVIS) 2-3V RIGHT COMPARISON:  Same day CT FINDINGS: Revisualization of a nondisplaced fracture of the RIGHT greater trochanter. Fracture line extends into the inter trochanteric proximal RIGHT femur. Osteopenia. Excreted contrast within the renal collecting system. Vascular calcifications. IMPRESSION: Revisualization of a nondisplaced fracture of the RIGHT greater trochanter with fracture line likely extending into the intertrochanteric femur. Evaluation for additional subtle fractures could be accomplished with a dedicated pelvic MRI if clinically desired. Electronically Signed   By: Meda Klinefelter MD   On: 05/13/2020 14:06    Review of Systems  Constitutional: Negative.   HENT: Negative.   Eyes: Negative.   Respiratory: Negative.   Cardiovascular: Negative.   Gastrointestinal: Positive for abdominal pain, nausea and vomiting.  Endocrine: Negative.   Genitourinary: Negative.   Musculoskeletal: Negative.   Skin: Negative.   Allergic/Immunologic: Negative.   Neurological: Negative.   Hematological: Negative.   Psychiatric/Behavioral: Negative.    Blood pressure (!) 191/62, pulse 74, temperature 97.7 F (36.5 C), temperature source Oral, resp. rate 18, height  (1.651 m), weight 53.1 kg, SpO2 95 %. Physical Exam Vitals reviewed.  Constitutional:      General: She is not in acute distress.    Appearance: Normal appearance. She is not ill-appearing.  HENT:     Head:  Normocephalic and atraumatic.     Right Ear: External ear normal.     Left Ear: External ear normal.     Nose: Nose normal.     Mouth/Throat:     Mouth: Mucous membranes are moist.     Pharynx: Oropharynx is clear.  Eyes:     General: No scleral icterus.    Extraocular Movements: Extraocular movements intact.     Conjunctiva/sclera: Conjunctivae normal.     Pupils: Pupils are equal, round, and reactive to light.  Cardiovascular:     Rate and Rhythm: Normal rate and regular rhythm.     Pulses: Normal pulses.     Heart sounds: Normal heart sounds.     Comments: No pitting edema lower extr Abdominal:     General: Abdomen is flat.     Palpations: Abdomen is soft.     Tenderness: There is no abdominal tenderness.     Comments: Mild distension  Musculoskeletal:        General: No tenderness or deformity. Normal range of motion.     Cervical back: Normal range of motion and neck supple. No tenderness.  Lymphadenopathy:     Comments: No groin or cervical lymphadenopathy  Skin:    General: Skin is warm and dry.     Coloration: Skin is not jaundiced.  Neurological:     General: No focal deficit present.     Mental Status: She is alert and oriented to person, place, and time.  Psychiatric:        Mood and Affect: Mood normal.        Behavior: Behavior normal.     Assessment/Plan: The patient appears to have a small bowel obstruction but seems very comfortable without any abdominal pain.  I would agree with bowel rest and  NG tube decompression.  In the morning she should start the small bowel protocol and we will follow her progress.  The family is very concerned about the possibility of having surgery at her age so as long as she is comfortable we will try to manage her nonoperatively.  If she does not improve though she may require exploration.  I discussed this with the family and they understand and agree.  Chevis Pretty III 05/13/2020, 8:00 PM

## 2020-05-13 NOTE — ED Triage Notes (Signed)
Pt came from home via EMS. Lives w/ husband. Several emesis episodes yesterday and today. Can tolerate fluids, but didn't get much sleep last night. Unable to take daily meds last night and have not had morning meds today. Denies nausea otw here. Hasn't vomited w/ EMS  BS: 188

## 2020-05-13 NOTE — ED Notes (Signed)
Pt is only oriented to self

## 2020-05-13 NOTE — ED Notes (Signed)
Patient had large amount of brown emesis with flecks of brown matter in vomit.

## 2020-05-13 NOTE — ED Notes (Signed)
X-ray at bedside

## 2020-05-13 NOTE — H&P (Signed)
History and Physical    [place] M Bundick WUJ:[id number] DOB: 06-23-1926 DOA: 05/13/2020  PCP: Alysia Penna, MD  Patient coming from: Home  Chief Complaint: N/V  HPI: [place] [name] is a 86 y.o. female with medical history significant of recurrent SBO, dementia, HTN. Presenting with 3 day of N. Hx is from husband. He reports that she has has worsening appetite over the last 3 days d/t nausea. She has had some diarrhea as well. He thought that she would be ok until this morning last night and this morning when she began having significant vomiting. At that point, he decided she should come to the ED. She was not given any treatment at home for the N/V/D. He denies any fever. She denies abdominal pain. They deny any other aggravating or alleviating factors.  ED Course: CT revealed SBO and right hip fracture. General surgery and orthopedics were consulted. TR[facility] was called for admission.   Review of Systems:  Denies CP, palpitations, dizziness, ab pain. Remainder of review of systems is negative for all not mentioned in HPI.    PMHx Past Medical History:  Diagnosis Date  . Arthritis    "qwhere"  . Coronary artery disease   . Hypercholesteremia   . Hypertension   . Myocardial infarction (HCC) 2003  . SBO (small bowel obstruction) (HCC) 12/18/2016  . Stenosis of subclavian artery (HCC)   . Type II diabetes mellitus (HCC)     PSHx Past Surgical History:  Procedure Laterality Date  . ABDOMINAL HYSTERECTOMY  1970  . BOWEL RESECTION N/A 12/20/2016   Procedure: SMALL BOWEL RESECTION;  Surgeon: Abigail Miyamoto, MD;  Location: [facility] OR;  Service: General;  Laterality: N/A;  . CARDIAC CATHETERIZATION    . CATARACT EXTRACTION Bilateral 2000's  . CORONARY ARTERY BYPASS GRAFT  2003   in [place], [place]  . LAPAROTOMY N/A 12/20/2016   Procedure: EXPLORATORY LAPAROTOMY;  Surgeon: Abigail Miyamoto, MD;  Location: [facility] OR;  Service: General;  Laterality: N/A;  . LEFT HEART  CATHETERIZATION WITH CORONARY/GRAFT ANGIOGRAM N/A 09/27/2014   Procedure: LEFT HEART CATHETERIZATION WITH Isabel Caprice;  Surgeon: Pamella Pert, MD;  Location: [facility] CATH LAB;  Service: Cardiovascular;  Laterality: N/A;  . UNILATERAL UPPER EXTREMEITY ANGIOGRAM N/A 09/27/2014   Procedure: SUBCLAVIAN ARTERIOGRAM ;  Surgeon: Pamella Pert, MD;  Location: [facility] CATH LAB;  Service: Cardiovascular;  Laterality: N/A;  . UNILATERAL UPPER EXTREMEITY ANGIOGRAM N/A 11/09/2014   Procedure: Left subclavian stent ;  Surgeon: Yates Decamp, MD;  Location: [facility] CATH LAB;  Service: Cardiovascular;  Laterality: N/A;  . VISCERAL ANGIOGRAM N/A 10/25/2014   Procedure: Laurence Slate;  Surgeon: Yates Decamp, MD;  Location: [facility] CATH LAB;  Service: Cardiovascular;  Laterality: N/A;    SocHx  reports that she has never smoked. She has never used smokeless tobacco. She reports that she does not drink alcohol and does not use drugs.  Allergies  Allergen Reactions  . Statins Other (See Comments)    Myalgias   . Other     NO FOODS WITH SEEDS  . Crestor [Rosuvastatin] Other (See Comments)    Myalgias  . Lipitor [Atorvastatin] Other (See Comments)    myalgias  . Zocor [Simvastatin] Other (See Comments)    myalgias    FamHx Family History  Problem Relation Age of Onset  . Diabetes Father   . Gallbladder disease Neg Hx     Prior to Admission medications   Medication Sig Start Date End Date Taking? Authorizing Provider  acetaminophen (  TYLENOL) 325 MG tablet Take 2 tablets (650 mg total) by mouth every 4 (four) hours as needed for headache or mild pain. 10/11/19   Drema DallasWoods, Curtis J, MD  alendronate (FOSAMAX) 70 MG tablet Take 70 mg by mouth every Monday.  12/08/14   [provider]  ALPRAZolam Prudy Feeler(XANAX) 0.5 MG tablet Take 0.5 mg by mouth 3 (three) times daily as needed. 10/01/19   [provider]  amLODipine (NORVASC) 10 MG tablet Take 10 mg by mouth at bedtime.  10/12/14   [provider]    aspirin EC 81 MG tablet Take 81 mg by mouth at bedtime.     [provider]  carvedilol (COREG) 6.25 MG tablet Take 12.5 mg by mouth 2 (two) times daily. 10/14/18   [provider]  Cholecalciferol (VITAMIN D3) 50 MCG (2000 UT) TABS Take 1 tablet by mouth daily.    [provider]  Dextrose-Sodium Chloride (DEXTROSE 5 % AND 0.45% NACL) infusion Inject 75 mLs into the vein continuous. 10/11/19   Drema DallasWoods, Curtis J, MD  diclofenac Sodium (VOLTAREN) 1 % GEL Apply 4 g topically 4 (four) times daily. 10/11/19   Drema DallasWoods, Curtis J, MD  gabapentin (NEURONTIN) 300 MG capsule Take 600 mg by mouth at bedtime.  04/09/17   [provider]  hydrALAZINE (APRESOLINE) 100 MG tablet Take 1 tablet (100 mg total) by mouth 3 (three) times daily. 10/11/19   Drema DallasWoods, Curtis J, MD  lovastatin (MEVACOR) 40 MG tablet Take 1 tablet (40 mg total) by mouth every evening. 08/27/19   Toniann FailKelley, Ashton Haynes, NP  pantoprazole (PROTONIX) 40 MG tablet Take 40 mg by mouth daily. 07/04/19   [provider]  triamcinolone cream (KENALOG) 0.1 % Apply 1 application topically 2 (two) times daily. 06/28/19   [provider]    Physical Exam: Vitals:   05/13/20 1030 05/13/20 1100 05/13/20 1300 05/13/20 1346  BP: (!) 188/72 (!) 199/74 (!) 194/77 (!) 191/62  Pulse: 79 74 77 74  Resp: (!) 27 20 (!) 26 18  Temp:   97.9 F (36.6 C) 97.7 F (36.5 C)  TempSrc:   Oral Oral  SpO2: 100% 98% 96% 95%  Weight:      Height:        General: 86 y.o. female resting in bed in NAD Eyes: PERRL, normal sclera ENMT: Nares patent w/o discharge, orophaynx clear, dentition normal, ears w/o discharge/lesions/ulcers Neck: Supple, trachea midline Cardiovascular: RRR, +S1, S2, no g/r, 3/6 holosystolic murmur, equal pulses throughout Respiratory: CTABL, no w/r/r, normal WOB GI: BS+, NDNT, no masses noted, no organomegaly noted, NGT in palce MSK: No e/c/c Skin: No rashes, bruises, ulcerations noted Neuro: A&O x name  only, no focal deficits Psyc: Flat affect, calm/cooperative  Labs on Admission: I have personally reviewed following labs and imaging studies  CBC: Recent Labs  Lab 05/13/20 0824  WBC 11.8*  NEUTROABS 10.6*  HGB 10.0*  HCT 31.8*  MCV 85.9  PLT 205   Basic Metabolic Panel: Recent Labs  Lab 05/13/20 0824  NA 142  K 4.2  CL 102  CO2 27  GLUCOSE 167*  BUN 22  CREATININE 1.32*  CALCIUM 9.6   GFR: Estimated Creatinine Clearance: 22.8 mL/min (A) (by C-G formula based on SCr of 1.32 mg/dL (H)). Liver Function Tests: Recent Labs  Lab 05/13/20 0824  AST 27  ALT 12  ALKPHOS 69  BILITOT 0.8  PROT 7.8  ALBUMIN 4.2   Recent Labs  Lab 05/13/20 0824  LIPASE 32  No results for input(s): AMMONIA in the last 168 hours. Coagulation Profile: No results for input(s): INR, PROTIME in the last 168 hours. Cardiac Enzymes: No results for input(s): CKTOTAL, CKMB, CKMBINDEX, TROPONINI in the last 168 hours. BNP (last 3 results) No results for input(s): PROBNP in the last 8760 hours. HbA1C: No results for input(s): HGBA1C in the last 72 hours. CBG: No results for input(s): GLUCAP in the last 168 hours. Lipid Profile: No results for input(s): CHOL, HDL, LDLCALC, TRIG, CHOLHDL, LDLDIRECT in the last 72 hours. Thyroid Function Tests: No results for input(s): TSH, T4TOTAL, FREET4, T3FREE, THYROIDAB in the last 72 hours. Anemia Panel: No results for input(s): VITAMINB12, FOLATE, FERRITIN, TIBC, IRON, RETICCTPCT in the last 72 hours. Urine analysis:    Component Value Date/Time   COLORURINE YELLOW 05/13/2020 0824   APPEARANCEUR CLEAR 05/13/2020 0824   LABSPEC 1.023 05/13/2020 0824   PHURINE 6.0 05/13/2020 0824   GLUCOSEU NEGATIVE 05/13/2020 0824   HGBUR NEGATIVE 05/13/2020 0824   BILIRUBINUR NEGATIVE 05/13/2020 0824   KETONESUR 5 (A) 05/13/2020 0824   PROTEINUR >=300 (A) 05/13/2020 0824   UROBILINOGEN 0.2 06/03/2013 1938   NITRITE NEGATIVE 05/13/2020 0824   LEUKOCYTESUR  NEGATIVE 05/13/2020 0824    Radiological Exams on Admission: DG Abdomen 1 View  Result Date: 05/13/2020 CLINICAL DATA:  Evaluate NG tube placement. EXAM: ABDOMEN - 1 VIEW COMPARISON:  Abdominal radiograph 05/13/2020. FINDINGS: Enteric tube tip and side-port project over the left upper quadrant. Multiple monitoring leads overlie the patient. Patchy opacities lung bases bilaterally. Contrast within the urinary bladder. Mild gaseous distended loops of bowel within the central abdomen. Lumbar spine degenerative changes. IMPRESSION: Enteric tube tip and side-port project over the left upper quadrant. Electronically Signed   By: Annia Belt M.D.   On: 05/13/2020 14:03   CT Abdomen Pelvis W Contrast  Result Date: 05/13/2020 CLINICAL DATA:  Nausea vomiting EXAM: CT ABDOMEN AND PELVIS WITH CONTRAST TECHNIQUE: Multidetector CT imaging of the abdomen and pelvis was performed using the standard protocol following bolus administration of intravenous contrast. CONTRAST:  56mL OMNIPAQUE IOHEXOL 300 MG/ML  SOLN COMPARISON:  July 17, 2019 FINDINGS: Lower chest: No acute abnormality. Hepatobiliary: Markedly thickened gallbladder wall with a complex partially calcified gallstone, unchanged in comparison to prior. Unchanged prominence of the common bile duct measuring up to 7 mm. No new focal hepatic abnormality. Pancreas: Atrophic in appearance. No new peripancreatic fat stranding. Spleen: Unchanged hypoenhancing mass of the superomedial spleen, likely a benign lesion. Adrenals/Urinary Tract: Adrenals are unremarkable. Kidneys enhance symmetrically. No hydronephrosis. Subcentimeter hypodense lesions are too small to accurately characterize. Bladder is unremarkable. Stomach/Bowel: There is dilation of the stomach and proximal duodenum, similar in comparison to prior. There is mild dilation of multiple loops of small bowel with decompression of distal loops of small bowel and colon. Transition point is likely in the mid  abdomen (series 47, image 81). The configuration of bowel is unchanged comparison to priors. Severe diverticulosis of the sigmoid rectal colon. No pneumatosis or portal venous gas. Vascular/Lymphatic: Severe atherosclerotic calcifications. No new suspicious lymphadenopathy. Reproductive: Status post hysterectomy. Other: There is asymmetric enhancement in the LEFT breast with an 11 mm area of enhancement in central outer LEFT breast (series 2, image 21). Musculoskeletal: Age-indeterminate fracture of the RIGHT greater trochanter. There is a fracture line extending into the inter trochanteric femur (series 5, image 70). Grade 1 anterolisthesis of L4-5. IMPRESSION: 1. Small-bowel obstruction with transition point likely in the mid abdomen. No pneumatosis or portal venous  gas. Configuration of obstruction is similar in comparison to previous small bowel obstruction in December 2020. 2. Age-indeterminate fracture of the RIGHT greater trochanter. There is a likely fracture line extending into the inter trochanteric femur. Consider further evaluation with a dedicated hip radiographs. 3. Asymmetric nodular enhancement in the LEFT breast. Recommend correlation with physical exam or mammographic history if clinically indicated. 4. Markedly thickened gallbladder wall with a complex partially calcified gallstone, unchanged in comparison to prior study. These results were called by telephone at the time of interpretation on 05/13/2020 at 12:00 pm to provider [facility] , who verbally acknowledged these results. Electronically Signed   By: Meda Klinefelter MD   On: 05/13/2020 12:05   DG Chest Port 1 View  Result Date: 05/13/2020 CLINICAL DATA:  Chest pain EXAM: PORTABLE CHEST 1 VIEW COMPARISON:  October 05, 2019 FINDINGS: The mediastinal contour and cardiac silhouette are stable. Patient status post prior the the [employer] and CABG. There is no focal infiltrate, pulmonary edema, or pleural effusion. Bony structures are  unremarkable. IMPRESSION: No acute cardiopulmonary disease identified. Electronically Signed   By: Sherian Rein M.D.   On: 05/13/2020 09:49   DG Hip Unilat With Pelvis 2-3 Views Right  Result Date: 05/13/2020 CLINICAL DATA:  Fracture EXAM: DG HIP (WITH OR WITHOUT PELVIS) 2-3V RIGHT COMPARISON:  Same day CT FINDINGS: Revisualization of a nondisplaced fracture of the RIGHT greater trochanter. Fracture line extends into the inter trochanteric proximal RIGHT femur. Osteopenia. Excreted contrast within the renal collecting system. Vascular calcifications. IMPRESSION: Revisualization of a nondisplaced fracture of the RIGHT greater trochanter with fracture line likely extending into the intertrochanteric femur. Evaluation for additional subtle fractures could be accomplished with a dedicated pelvic MRI if clinically desired. Electronically Signed   By: Meda Klinefelter MD   On: 05/13/2020 14:06    EKG: Independently reviewed. Sinus, LBBB as previously present  Assessment/Plan SBO N/V     - admit to inpatient, med-surg     - place NGT to LSW     - CT results noted     - Gen surg consulted, will see in AM, appreciate assistance     - NPO     - anti-emetics, IVF  CKD3b     - she is at baseline     - watch nephrotoxins  Dementia     - alert to name only; this is her baseline  HTN     - NPO currently     - received hydralazine IV; will start PRN metoprolol  Right hip Fx     - orthopedics notified [facility], Dr. August Saucer); requesting MRI, ordered     - will await results and recommendations  HLD     - on statin at home; holding d/t NPO status  DVT prophylaxis: heparin  Code Status: FULL (confirmed with husband at bedside)  Family Communication: With husband at bedside  Consults called: General surgery, Orthopedics  Admission status: Inpatient  Status is: Inpatient  Remains inpatient appropriate because:IV treatments appropriate due to intensity of illness or inability to take  PO   Dispo: The patient is from: Home              Anticipated d/c is to: Home              Anticipated d/c date is: > 3 days              Patient currently is not medically stable to d/c.  Anamarie Hunn Martin Majestic  DO [facility]  If 7PM-7AM, please contact night-coverage [email or web address]  05/13/2020, 3:04 PM

## 2020-05-14 ENCOUNTER — Inpatient Hospital Stay (HOSPITAL_COMMUNITY): Payer: Medicare Other

## 2020-05-14 DIAGNOSIS — F0281 Dementia in other diseases classified elsewhere with behavioral disturbance: Secondary | ICD-10-CM

## 2020-05-14 DIAGNOSIS — Z8781 Personal history of (healed) traumatic fracture: Secondary | ICD-10-CM

## 2020-05-14 DIAGNOSIS — N1831 Chronic kidney disease, stage 3a: Secondary | ICD-10-CM

## 2020-05-14 DIAGNOSIS — E119 Type 2 diabetes mellitus without complications: Secondary | ICD-10-CM

## 2020-05-14 DIAGNOSIS — G308 Other Alzheimer's disease: Secondary | ICD-10-CM

## 2020-05-14 LAB — CBC
HCT: 30 % — ABNORMAL LOW (ref 36.0–46.0)
Hemoglobin: 9.3 g/dL — ABNORMAL LOW (ref 12.0–15.0)
MCH: 26.8 pg (ref 26.0–34.0)
MCHC: 31 g/dL (ref 30.0–36.0)
MCV: 86.5 fL (ref 80.0–100.0)
Platelets: 194 10*3/uL (ref 150–400)
RBC: 3.47 MIL/uL — ABNORMAL LOW (ref 3.87–5.11)
RDW: 17.5 % — ABNORMAL HIGH (ref 11.5–15.5)
WBC: 12.9 10*3/uL — ABNORMAL HIGH (ref 4.0–10.5)
nRBC: 0 % (ref 0.0–0.2)

## 2020-05-14 LAB — COMPREHENSIVE METABOLIC PANEL
ALT: 9 U/L (ref 0–44)
AST: 16 U/L (ref 15–41)
Albumin: 3.6 g/dL (ref 3.5–5.0)
Alkaline Phosphatase: 63 U/L (ref 38–126)
Anion gap: 14 (ref 5–15)
BUN: 27 mg/dL — ABNORMAL HIGH (ref 8–23)
CO2: 30 mmol/L (ref 22–32)
Calcium: 9.3 mg/dL (ref 8.9–10.3)
Chloride: 101 mmol/L (ref 98–111)
Creatinine, Ser: 1.38 mg/dL — ABNORMAL HIGH (ref 0.44–1.00)
GFR, Estimated: 33 mL/min — ABNORMAL LOW (ref >60)
Glucose, Bld: 147 mg/dL — ABNORMAL HIGH (ref 70–99)
Potassium: 3.5 mmol/L (ref 3.5–5.1)
Sodium: 145 mmol/L (ref 135–145)
Total Bilirubin: 1.1 mg/dL (ref 0.3–1.2)
Total Protein: 7 g/dL (ref 6.5–8.1)

## 2020-05-14 MED ORDER — LACTATED RINGERS IV SOLN: INTRAVENOUS | Status: AC

## 2020-05-14 MED ORDER — DIATRIZOATE MEGLUMINE & SODIUM 66-10 % PO SOLN
90.0000 mL | Freq: Once | ORAL | Status: AC
  Administered 2020-05-14: 90 mL via NASOGASTRIC
  Filled 2020-05-14: qty 90

## 2020-05-14 NOTE — Progress Notes (Addendum)
ORTHOPAEDIC CONSULTATION  REQUESTING PHYSICIAN: Joycelyn Das, MD  Chief Complaint: Concern for right hip fracture  HPI: Sonya Chavez is a 86 y.o. female who presents for evaluation of small bowel obstruction.  Also noted on her imaging studies was a right hip greater trochanteric fracture with possible extension into the intertrochanteric region.  Patient and husband state that she is walking well with no complaint of right hip or groin pain.  She has no recent falls.  She did stay in a nursing facility and had a fall about 4 months ago but she has been staying at home for the last 1 to 2 months and has had no injuries in that timeframe.  She is fairly active and is able to walk around [place] and the grocery store and attend church with no significant complaints aside from chronic knee and foot pain.  Her husband states that she did walk into the hospital yesterday.  Past Medical History:  Diagnosis Date  . Arthritis    "qwhere"  . Coronary artery disease   . Hypercholesteremia   . Hypertension   . Myocardial infarction (HCC) 2003  . SBO (small bowel obstruction) (HCC) 12/18/2016  . Stenosis of subclavian artery (HCC)   . Type II diabetes mellitus (HCC)    Past Surgical History:  Procedure Laterality Date  . ABDOMINAL HYSTERECTOMY  1970  . BOWEL RESECTION N/A 12/20/2016   Procedure: SMALL BOWEL RESECTION;  Surgeon: Abigail Miyamoto, MD;  Location: [facility] OR;  Service: General;  Laterality: N/A;  . CARDIAC CATHETERIZATION    . CATARACT EXTRACTION Bilateral 2000's  . CORONARY ARTERY BYPASS GRAFT  2003   in [place], [place]  . LAPAROTOMY N/A 12/20/2016   Procedure: EXPLORATORY LAPAROTOMY;  Surgeon: Abigail Miyamoto, MD;  Location: [facility] OR;  Service: General;  Laterality: N/A;  . LEFT HEART CATHETERIZATION WITH CORONARY/GRAFT ANGIOGRAM N/A 09/27/2014   Procedure: LEFT HEART CATHETERIZATION WITH Isabel Caprice;  Surgeon: Pamella Pert, MD;  Location: [facility] CATH LAB;   Service: Cardiovascular;  Laterality: N/A;  . UNILATERAL UPPER EXTREMEITY ANGIOGRAM N/A 09/27/2014   Procedure: SUBCLAVIAN ARTERIOGRAM ;  Surgeon: Pamella Pert, MD;  Location: [facility] CATH LAB;  Service: Cardiovascular;  Laterality: N/A;  . UNILATERAL UPPER EXTREMEITY ANGIOGRAM N/A 11/09/2014   Procedure: Left subclavian stent ;  Surgeon: Yates Decamp, MD;  Location: [facility] CATH LAB;  Service: Cardiovascular;  Laterality: N/A;  . VISCERAL ANGIOGRAM N/A 10/25/2014   Procedure: Laurence Slate;  Surgeon: Yates Decamp, MD;  Location: [facility] CATH LAB;  Service: Cardiovascular;  Laterality: N/A;   Social History   Socioeconomic History  . Marital status: Married    Spouse name: Not on file  . Number of children: 1  . Years of education: Not on file  . Highest education level: Not on file  Occupational History  . Not on file  Tobacco Use  . Smoking status: Never Smoker  . Smokeless tobacco: Never Used  Vaping Use  . Vaping Use: Never used  Substance and Sexual Activity  . Alcohol use: No  . Drug use: No  . Sexual activity: Not Currently  Other Topics Concern  . Not on file  Social History Narrative  . Not on file   Social Determinants of Health   Financial Resource Strain: Low Risk   . Difficulty of Paying Living Expenses: Not very hard  Food Insecurity: No Food Insecurity  . Worried About Programme researcher, broadcasting/film/video in the Last Year: Never true  . Ran Out of  Food in the Last Year: Never true  Transportation Needs: No Transportation Needs  . Lack of Transportation (Medical): No  . Lack of Transportation (Non-Medical): No  Physical Activity: Inactive  . Days of Exercise per Week: 0 days  . Minutes of Exercise per Session: 0 min  Stress: No Stress Concern Present  . Feeling of Stress : Only a little  Social Connections: Moderately Isolated  . Frequency of Communication with Friends and Family: More than three times a week  . Frequency of Social Gatherings with Friends and Family: Never  .  Attends Religious Services: Never  . Active Member of Clubs or Organizations: No  . Attends BankerClub or Organization Meetings: Never  . Marital Status: Married   Family History  Problem Relation Age of Onset  . Diabetes Father   . Gallbladder disease Neg Hx    - negative except otherwise stated in the family history section Allergies  Allergen Reactions  . Statins Other (See Comments)    Myalgias   . Other     NO FOODS WITH SEEDS  . Crestor [Rosuvastatin] Other (See Comments)    Myalgias  . Lipitor [Atorvastatin] Other (See Comments)    myalgias  . Zocor [Simvastatin] Other (See Comments)    myalgias   Prior to Admission medications   Medication Sig Start Date End Date Taking? Authorizing Provider  acetaminophen (TYLENOL) 325 MG tablet Take 2 tablets (650 mg total) by mouth every 4 (four) hours as needed for headache or mild pain. Patient taking differently: Take 325 mg by mouth every 4 (four) hours as needed for headache or mild pain.  10/11/19  Yes Drema DallasWoods, Curtis J, MD  alendronate (FOSAMAX) 70 MG tablet Take 70 mg by mouth every Monday.  12/08/14  Yes [provider]  ALPRAZolam Prudy Feeler(XANAX) 0.5 MG tablet Take 0.5 mg by mouth 3 (three) times daily as needed for anxiety.  10/01/19  Yes [provider]  amLODipine (NORVASC) 10 MG tablet Take 10 mg by mouth at bedtime.  10/12/14  Yes [provider]  aspirin EC 81 MG tablet Take 81 mg by mouth daily.    Yes [provider]  carvedilol (COREG) 6.25 MG tablet Take 12.5 mg by mouth 2 (two) times daily. 10/14/18  Yes [provider]  Cholecalciferol (VITAMIN D3) 50 MCG (2000 UT) TABS Take 1 tablet by mouth daily.   Yes [provider]  Dextrose-Sodium Chloride (DEXTROSE 5 % AND 0.45% NACL) infusion Inject 75 mLs into the vein continuous. 10/11/19  Yes Drema DallasWoods, Curtis J, MD  diclofenac Sodium (VOLTAREN) 1 % GEL Apply 4 g topically 4 (four) times daily. Patient taking differently: Apply 4 g topically 4  (four) times daily as needed (pain).  10/11/19  Yes Drema DallasWoods, Curtis J, MD  divalproex (DEPAKOTE) 125 MG DR tablet Take 125 mg by mouth at bedtime. 03/11/20  Yes [provider]  Ferrous Sulfate (IRON PO) Take 1 tablet by mouth daily.   Yes [provider]  gabapentin (NEURONTIN) 300 MG capsule Take 600 mg by mouth at bedtime.  04/09/17  Yes [provider]  hydrALAZINE (APRESOLINE) 100 MG tablet Take 1 tablet (100 mg total) by mouth 3 (three) times daily. 10/11/19  Yes Drema DallasWoods, Curtis J, MD  lovastatin (MEVACOR) 40 MG tablet Take 1 tablet (40 mg total) by mouth every evening. 08/27/19  Yes Toniann FailKelley, Ashton Haynes, NP  metFORMIN (GLUCOPHAGE) 500 MG tablet Take 500 mg by mouth at bedtime. 03/30/20  Yes [provider]  sertraline (ZOLOFT) 25 MG tablet Take 25 mg by mouth daily. 03/11/20  Yes [provider]   DG Abdomen 1 View  Result Date: 05/13/2020 CLINICAL DATA:  Evaluate NG tube placement. EXAM: ABDOMEN - 1 VIEW COMPARISON:  Abdominal radiograph 05/13/2020. FINDINGS: Enteric tube tip and side-port project over the left upper quadrant. Multiple monitoring leads overlie the patient. Patchy opacities lung bases bilaterally. Contrast within the urinary bladder. Mild gaseous distended loops of bowel within the central abdomen. Lumbar spine degenerative changes. IMPRESSION: Enteric tube tip and side-port project over the left upper quadrant. Electronically Signed   By: Annia Belt M.D.   On: 05/13/2020 14:03   CT Abdomen Pelvis W Contrast  Result Date: 05/13/2020 CLINICAL DATA:  Nausea vomiting EXAM: CT ABDOMEN AND PELVIS WITH CONTRAST TECHNIQUE: Multidetector CT imaging of the abdomen and pelvis was performed using the standard protocol following bolus administration of intravenous contrast. CONTRAST:  68mL OMNIPAQUE IOHEXOL 300 MG/ML  SOLN COMPARISON:  July 17, 2019 FINDINGS: Lower chest: No acute abnormality. Hepatobiliary: Markedly thickened gallbladder wall with a  complex partially calcified gallstone, unchanged in comparison to prior. Unchanged prominence of the common bile duct measuring up to 7 mm. No new focal hepatic abnormality. Pancreas: Atrophic in appearance. No new peripancreatic fat stranding. Spleen: Unchanged hypoenhancing mass of the superomedial spleen, likely a benign lesion. Adrenals/Urinary Tract: Adrenals are unremarkable. Kidneys enhance symmetrically. No hydronephrosis. Subcentimeter hypodense lesions are too small to accurately characterize. Bladder is unremarkable. Stomach/Bowel: There is dilation of the stomach and proximal duodenum, similar in comparison to prior. There is mild dilation of multiple loops of small bowel with decompression of distal loops of small bowel and colon. Transition point is likely in the mid abdomen (series 47, image 81). The configuration of bowel is unchanged comparison to priors. Severe diverticulosis of the sigmoid rectal colon. No pneumatosis or portal venous gas. Vascular/Lymphatic: Severe atherosclerotic calcifications. No new suspicious lymphadenopathy. Reproductive: Status post hysterectomy. Other: There is asymmetric enhancement in the LEFT breast with an 11 mm area of enhancement in central outer LEFT breast (series 2, image 21). Musculoskeletal: Age-indeterminate fracture of the RIGHT greater trochanter. There is a fracture line extending into the inter trochanteric femur (series 5, image 70). Grade 1 anterolisthesis of L4-5. IMPRESSION: 1. Small-bowel obstruction with transition point likely in the mid abdomen. No pneumatosis or portal venous gas. Configuration of obstruction is similar in comparison to previous small bowel obstruction in December 2020. 2. Age-indeterminate fracture of the RIGHT greater trochanter. There is a likely fracture line extending into the inter trochanteric femur. Consider further evaluation with a dedicated hip radiographs. 3. Asymmetric nodular enhancement in the LEFT breast. Recommend  correlation with physical exam or mammographic history if clinically indicated. 4. Markedly thickened gallbladder wall with a complex partially calcified gallstone, unchanged in comparison to prior study. These results were called by telephone at the time of interpretation on 05/13/2020 at 12:00 pm to provider [facility] , who verbally acknowledged these results. Electronically Signed   By: Meda Klinefelter MD   On: 05/13/2020 12:05   DG Chest Port 1 View  Result Date: 05/13/2020 CLINICAL DATA:  Chest pain EXAM: PORTABLE CHEST 1 VIEW COMPARISON:  October 05, 2019 FINDINGS: The mediastinal contour and cardiac silhouette are stable. Patient status post prior the the [employer] and CABG. There is no focal infiltrate, pulmonary edema, or pleural effusion. Bony structures are unremarkable. IMPRESSION: No acute cardiopulmonary disease identified. Electronically Signed   By: Gabriel Carina.D.  On: 05/13/2020 09:49   DG Hip Unilat With Pelvis 2-3 Views Right  Result Date: 05/13/2020 CLINICAL DATA:  Fracture EXAM: DG HIP (WITH OR WITHOUT PELVIS) 2-3V RIGHT COMPARISON:  Same day CT FINDINGS: Revisualization of a nondisplaced fracture of the RIGHT greater trochanter. Fracture line extends into the inter trochanteric proximal RIGHT femur. Osteopenia. Excreted contrast within the renal collecting system. Vascular calcifications. IMPRESSION: Revisualization of a nondisplaced fracture of the RIGHT greater trochanter with fracture line likely extending into the intertrochanteric femur. Evaluation for additional subtle fractures could be accomplished with a dedicated pelvic MRI if clinically desired. Electronically Signed   By: Meda Klinefelter MD   On: 05/13/2020 14:06   - pertinent xrays, CT, MRI studies were reviewed and independently interpreted  Positive ROS: All other systems have been reviewed and were otherwise negative with the exception of those mentioned in the HPI and as above.  Physical Exam: General:  Alert, no acute distress Psychiatric: Patient is competent for consent with normal mood and affect Lymphatic: No axillary or cervical lymphadenopathy Cardiovascular: No pedal edema Respiratory: No cyanosis, no use of accessory musculature GI: No organomegaly, abdomen is soft and non-tender    Images:  @ENCIMAGES @  Labs:  Lab Results  Component Value Date   HGBA1C 7.0 (H) 10/09/2019   HGBA1C 7.7 (H) 07/19/2019   HGBA1C 7.5 (H) 12/30/2018   REPTSTATUS 10/06/2019 FINAL 10/05/2019   CULT MULTIPLE SPECIES PRESENT, SUGGEST RECOLLECTION (A) 10/05/2019   LABORGA KLEBSIELLA PNEUMONIAE (A) 07/16/2019    Lab Results  Component Value Date   ALBUMIN 3.6 05/14/2020   ALBUMIN 4.2 05/13/2020   ALBUMIN 2.6 (L) 10/11/2019    Neurologic: Patient does not have protective sensation bilateral lower extremities.   MUSCULOSKELETAL:   Ortho exam demonstrates right hip with no significant pain with terminal hip flexion, internal rotation, external rotation.  Negative Stinchfield exam.  No pain with logroll of the right hip.  No effusion of bilateral knees.  She does have tenderness to palpation over the greater trochanter but she does not endorse any groin pain throughout exam.  Dorsiflexion and plantar flexion are intact in the right foot.  Right foot is warm and well-perfused.  No swelling or bruising is noted throughout the right hip.  Assessment: History of right hip greater trochanteric fracture  Plan: Patient is a 86 year old female who presents for evaluation of right hip fracture.  She does have greater trochanteric fracture on CT scan today with sclerosis noted throughout the intertrochanteric region.  She has had no injuries recently and has no complaints of right groin or hip pain.  Last imaging studies were in December 2020.  Impression is that she may have sustained this fracture when she had a fall 4 months ago but now it seems to be healing well with her endorsing no significant pain  with weightbearing.  She has no significant pain that can be reproduced on exam aside from some tenderness over the greater trochanter.  Plan for evaluation by physical therapy to see how she does with weightbearing.  No plans for operative management at this time.  Follow-up with Dr. January 2021 in clinic 1 week after discharge.    Thank you for the consult and the opportunity to see Ms. August Saucer Davey, [facility] [phone] 8:53 AM

## 2020-05-14 NOTE — Progress Notes (Signed)
Notified care team physicians of Sonya Chavez's lack of voiding. IV fluids were ordered and started, however patient is still not voiding. Will continue to monitor.

## 2020-05-14 NOTE — Plan of Care (Signed)

## 2020-05-14 NOTE — Evaluation (Signed)
Physical Therapy Evaluation Patient Details Name: Sonya Chavez MRN: [id number] DOB: 1926/06/16 Today's Date: 05/14/2020   History of Present Illness  Pt admitted with N/V and dx with SBO.  Pt with hx of DM, MI, CAD, CABG and dementia.  Pt with noted old R hip fx on film but cleared by orthopedic consult as WBAT  Clinical Impression  Pt admitted as above and presenting with functional mobility limitations 2* generalized weakness, balance deficits and dementia related cognitive deficits.  Pt's spouse present and states goal is for pt to return to previous living arrangement with 24/7 sup/assist.    Follow Up Recommendations Home health PT;No PT follow up (dependent on acute stay progress)    Equipment Recommendations  None recommended by PT    Recommendations for Other Services       Precautions / Restrictions Precautions Precautions: Fall Restrictions Weight Bearing Restrictions: No RLE Weight Bearing: Weight bearing as tolerated      Mobility  Bed Mobility Overal bed mobility: Needs Assistance Bed Mobility: Supine to Sit           General bed mobility comments: supervision for safety  Transfers Overall transfer level: Needs assistance Equipment used: None Transfers: Sit to/from Stand           General transfer comment: min assist to bring wt up and fwd and to balance in initial standing  Ambulation/Gait Ambulation/Gait assistance: Min assist Gait Distance (Feet): 240 Feet Assistive device: 1 person hand held assist;Straight cane Gait Pattern/deviations: Step-through pattern;Decreased step length - right;Decreased step length - left;Shuffle;Narrow base of support Gait velocity: decr   General Gait Details: mild general instability partially corrected with use of SPC  Stairs            Wheelchair Mobility    Modified Rankin (Stroke Patients Only)       Balance Overall balance assessment: Needs assistance Sitting-balance support: Feet  supported;No upper extremity supported Sitting balance-Leahy Scale: Good     Standing balance support: No upper extremity supported Standing balance-Leahy Scale: Fair                               Pertinent Vitals/Pain Pain Assessment: No/denies pain    Home Living Family/patient expects to be discharged to:: Private residence Living Arrangements: Spouse/significant other Available Help at Discharge: Family;Available 24 hours/day Type of Home: House Home Access: Stairs to enter Entrance Stairs-Rails: Doctor, general practice of Steps: 3 Home Layout: One level Home Equipment: Walker - 2 wheels;Wheelchair - manual;Cane - single point      Prior Function Level of Independence: Needs assistance   Gait / Transfers Assistance Needed: with or without cane but with supervision     Comments: Spouse states pt mobilizes in home with cane or sans AD but with supervision by spouse at all times     Hand Dominance   Dominant Hand: Right    Extremity/Trunk Assessment   Upper Extremity Assessment Upper Extremity Assessment: Generalized weakness    Lower Extremity Assessment Lower Extremity Assessment: Generalized weakness       Communication   Communication: No difficulties  Cognition Arousal/Alertness: Awake/alert Behavior During Therapy: Impulsive;Flat affect Overall Cognitive Status: History of cognitive impairments - at baseline                                 General Comments: dementia.  Pt oriented to person  General Comments      Exercises     Assessment/Plan    PT Assessment Patient needs continued PT services  PT Problem List Decreased strength;Decreased balance;Decreased mobility;Decreased knowledge of use of DME;Decreased cognition       PT Treatment Interventions DME instruction;Gait training;Stair training;Functional mobility training;Therapeutic activities;Therapeutic exercise;Patient/family education    PT  Goals (Current goals can be found in the Care Plan section)  Acute Rehab PT Goals Patient Stated Goal: No goals stated.  Spouse's goal is for return home to previous living arrangement PT Goal Formulation: With patient/family Time For Goal Achievement: 05/28/20 Potential to Achieve Goals: Fair    Frequency Min 3X/week   Barriers to discharge        Co-evaluation               AM-PAC PT "6 Clicks" Mobility  Outcome Measure Help needed turning from your back to your side while in a flat bed without using bedrails?: None Help needed moving from lying on your back to sitting on the side of a flat bed without using bedrails?: None Help needed moving to and from a bed to a chair (including a wheelchair)?: A Little Help needed standing up from a chair using your arms (e.g., wheelchair or bedside chair)?: A Little Help needed to walk in hospital room?: A Little Help needed climbing 3-5 steps with a railing? : A Little 6 Click Score: 20    End of Session Equipment Utilized During Treatment: Gait belt Activity Tolerance: Patient tolerated treatment well Patient left: in chair;with call bell/phone within reach;with chair alarm set;with family/visitor present Nurse Communication: Mobility status PT Visit Diagnosis: Unsteadiness on feet (R26.81);Muscle weakness (generalized) (M62.81)    Time: 3354-5625 PT Time Calculation (min) (ACUTE ONLY): 26 min   Charges:   PT Evaluation $PT Eval Low Complexity: 1 Low PT Treatments $Gait Training: 8-22 mins        Mauro Kaufmann PT Acute Rehabilitation Services Pager [phone] Office [phone]   Bralin Garry 05/14/2020, 4:12 PM

## 2020-05-14 NOTE — Progress Notes (Signed)
Subjective No acute events.  She is resting comfortably.  She denies any abdominal pain whatsoever.  Denies nausea or vomiting with NG tube in place.  She denies flatus/bowel movement.  She also denies any significant distention.  Objective: Vital signs in last 24 hours: Temp:  [97.7 F (36.5 C)-98.7 F (37.1 C)] 98.1 F (36.7 C) (10/10 0622) Pulse Rate:  [68-79] 68 (10/10 0622) Resp:  [15-27] 17 (10/10 0622) BP: (130-199)/(62-82) 130/82 (10/10 0622) SpO2:  [92 %-100 %] 92 % (10/10 0622) Last BM Date: 05/11/20  Intake/Output from previous day: 10/09 0701 - 10/10 0700 In: 173.2 [I.V.:173.2] Out: 1500 [Urine:100; Emesis/NG output:1400] Intake/Output this shift: No intake/output data recorded.  Gen: NAD, comfortable CV: RRR Pulm: Normal work of breathing Abd: Soft, nontender throughout; minimally distended at this time. No rebound nor guarding. Ext: SCDs in place  Lab Results: CBC  Recent Labs    05/13/20 0824 05/14/20 0317  WBC 11.8* 12.9*  HGB 10.0* 9.3*  HCT 31.8* 30.0*  PLT 205 194   BMET Recent Labs    05/13/20 0824 05/14/20 0317  NA 142 145  K 4.2 3.5  CL 102 101  CO2 27 30  GLUCOSE 167* 147*  BUN 22 27*  CREATININE 1.32* 1.38*  CALCIUM 9.6 9.3   PT/INR No results for input(s): LABPROT, INR in the last 72 hours. ABG No results for input(s): PHART, HCO3 in the last 72 hours.  Invalid input(s): PCO2, PO2  Studies/Results:  Anti-infectives: Anti-infectives (From admission, onward)   None       Assessment/Plan: Patient Active Problem List   Diagnosis Date Noted  . DNR (do not resuscitate) 10/07/2019  . Alzheimer's dementia with behavioral disturbance (HCC)   . Palliative care encounter   . Failure to thrive (0-17) 10/06/2019  . Weakness generalized 10/05/2019  . Hypernatremia 07/18/2019  . Hypokalemia 07/18/2019  . Elevated troponin 07/17/2019  . Acute lower UTI 07/17/2019  . Positive D dimer 07/17/2019  . Normocytic anemia 07/17/2019   . Cholelithiasis 01/02/2019  . CKD (chronic kidney disease), stage III (HCC) 10/21/2017  . Delirium due to another medical condition 12/18/2016  . SBO (small bowel obstruction) (HCC) 12/18/2016  . 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 Leukocytosis 12/18/2016  . Peripheral artery disease (HCC) 11/08/2014  . PAD (peripheral artery disease) (HCC) 10/24/2014  . LBBB (left bundle branch block) 09/27/2014    Class: Chronic  . Subclavian artery stenosis, left (HCC) 09/27/2014  . Numbness and tingling in left hand 03/08/2013  . HTN (hypertension) 03/08/2013  . DM (diabetes mellitus) (HCC) 03/08/2013  . CAD (coronary artery disease) 03/08/2013   Sonya Chavez is a pleasant 89yoF with HTN, HLD, DM, Dementia/Alz, CKD, CAD, PAD here with SBO in setting of multiple prior abdominal surgical procedures - last being exlap/SBR/appendectomy 2018 with Dr. Magnus Ivan for SBO where she was found to have closed loop obstruction  -Clinically she is doing well.  Her abdominal exam remains reassuring.  Continue NPO/NGT -Would advocate for MIVF and may benefit from upfront bolus; appears hypovolemic and with lower UOP. RN is contacting her primary team to discuss further  -'SBO protocol' has been ordered to follow her progress radiographically -Family appropriately concerned about her operative risks.  Hopefully this will improve in the coming days.   LOS: 1 day   Stephanie Coup. Cliffton Asters, M.D. [facility] Use [email or web address] to contact on call provider

## 2020-05-14 NOTE — Progress Notes (Addendum)
PROGRESS NOTE  [place] ZOX:[id number] DOB: [birth year] DOA: 05/13/2020 PCP: Alysia PennaHolwerda, Scott, MD   LOS: 1 day   Brief narrative: As per HPI,  Sonya Chavez is a 86 y.o. female with medical history significant of recurrent SBO, dementia, HTN, presented to hospital with complaints of diminished appetite and nausea diarrhea and vomiting.  She did have significant vomiting on the morning of presentation.  Patient was then brought in the hospital.  In the ED a CT scan of the abdomen showed small bowel obstruction and right hip fracture.  Neurosurgery and orthopedics were consulted.    Assessment/Plan:  Principal Problem:   SBO (small bowel obstruction) (HCC) Active Problems:   DM (diabetes mellitus) (HCC)   CKD (chronic kidney disease), stage III (HCC)   Alzheimer's dementia with behavioral disturbance (HCC)   S/P right hip fracture   Nausea vomiting secondary to small bowel obstruction.  CT scan of the abdomen on presentation showed small bowel obstruction with transition point likely in the midabdomen.  General surgery was consulted.  Continue n.p.o. IV fluids conservative management.  Currently on NG tube. History of recurrent SBO in the past.  Check abdominal x-ray.      CKD3a Closely monitor BMP.  At baseline at this time  Dementia Is alert to name only.  Likely at baseline dementia.  Essential HTN  PRN metoprolol as patient is n.p.o.  Right hip Fx Likely old fracture. X-ray of the right hip showed nondisplaced fracture of the right wrist trochanter with fracture line extending into the intertrochanteric femur.  Orthopedics notified [facility], Dr. August Saucerean); orthopedics requested MRI.  MRI has been ordered.  Hyperlipidemia Statin on hold at this time.  DVT prophylaxis: heparin injection 5,000 Units Start: 05/13/20 2200    Code Status: Full Code  Family Communication: spoke with the husband at bedside.  Status is: Inpatient  Remains  inpatient appropriate because:IV treatments appropriate due to intensity of illness or inability to take PO, Inpatient level of care appropriate due to severity of illness and Small bowel obstruction, hip fracture   Dispo: The patient is from: Home              Anticipated d/c is to: SNF versus home with home health, will get PT evaluation               Anticipated d/c date is: 2 days              Patient currently is not medically stable to d/c.   Consultants:  Orthopedics  General surgery  Procedures:  NG tube  Antibiotics:  . none  Anti-infectives (From admission, onward)   None     Subjective: Today, patient was seen and examined at bedside. Poor historian. Husband at bedside. No vomiting or pain but has NG tube. No bowel movement. No shortness of breath, fever or chills.  Objective: Vitals:   05/13/20 2114 05/14/20 0622  BP: 135/80 130/82  Pulse: 72 68  Resp: 15 17  Temp: 98.7 F (37.1 C) 98.1 F (36.7 C)  SpO2: 94% 92%    Intake/Output Summary (Last 24 hours) at 05/14/2020 0750 Last data filed at 05/14/2020 0630 Gross per 24 hour  Intake 173.24 ml  Output 1500 ml  Net -1326.76 ml   Filed Weights   05/13/20 0801  Weight: 53.1 kg   Body mass index is 19.48 kg/m.   Physical Exam: GENERAL: Patient is alert awake and communicative, NG tube in place, thinly built  Not in  obvious distress. HENT: No scleral pallor or icterus. Pupils equally reactive to light. Oral mucosa is moist NECK: is supple, no gross swelling noted. CHEST: Clear to auscultation. No crackles or wheezes.  Diminished breath sounds bilaterally. CVS: S1 and S2 heard, systolic murmur heard.  Regular rate and rhythm.  ABDOMEN: soft, scaphoid, non tender at the time of exam EXTREMITIES:  Non tender right hip CNS: Cranial nerves are intact. No focal motor deficits. SKIN: warm and dry without rashes.  Data Review: I have personally reviewed the following laboratory data and  studies,  CBC: Recent Labs  Lab 05/13/20 0824 05/14/20 0317  WBC 11.8* 12.9*  NEUTROABS 10.6*  --   HGB 10.0* 9.3*  HCT 31.8* 30.0*  MCV 85.9 86.5  PLT 205 194   Basic Metabolic Panel: Recent Labs  Lab 05/13/20 0824 05/14/20 0317  NA 142 145  K 4.2 3.5  CL 102 101  CO2 27 30  GLUCOSE 167* 147*  BUN 22 27*  CREATININE 1.32* 1.38*  CALCIUM 9.6 9.3   Liver Function Tests: Recent Labs  Lab 05/13/20 0824 05/14/20 0317  AST 27 16  ALT 12 9  ALKPHOS 69 63  BILITOT 0.8 1.1  PROT 7.8 7.0  ALBUMIN 4.2 3.6   Recent Labs  Lab 05/13/20 0824  LIPASE 32   No results for input(s): AMMONIA in the last 168 hours. Cardiac Enzymes: No results for input(s): CKTOTAL, CKMB, CKMBINDEX, TROPONINI in the last 168 hours. BNP (last 3 results) Recent Labs    07/16/19 2240  BNP 278.7*    ProBNP (last 3 results) No results for input(s): PROBNP in the last 8760 hours.  CBG: No results for input(s): GLUCAP in the last 168 hours. Recent Results (from the past 240 hour(s))  Respiratory Panel by RT PCR (Flu A&B, Covid) - Nasopharyngeal Swab     Status: None   Collection Time: 05/13/20 11:44 AM   Specimen: Nasopharyngeal Swab  Result Value Ref Range Status   SARS Coronavirus 2 by RT PCR NEGATIVE NEGATIVE Final    Comment: (NOTE) SARS-CoV-2 target nucleic acids are NOT DETECTED.  The SARS-CoV-2 RNA is generally detectable in upper respiratoy specimens during the acute phase of infection. The lowest concentration of SARS-CoV-2 viral copies this assay can detect is 131 copies/mL. A negative result does not preclude SARS-Cov-2 infection and should not be used as the sole basis for treatment or other patient management decisions. A negative result may occur with  improper specimen collection/handling, submission of specimen other than nasopharyngeal swab, presence of viral mutation(s) within the areas targeted by this assay, and inadequate number of viral copies (<131 copies/mL).  A negative result must be combined with clinical observations, patient history, and epidemiological information. The expected result is Negative.  Fact Sheet for Patients:  https://www.moore.com/  Fact Sheet for Healthcare Providers:  https://www.young.biz/  This test is no t yet approved or cleared by the [place] FDA and  has been authorized for detection and/or diagnosis of SARS-CoV-2 by FDA under an Emergency Use Authorization (EUA). This EUA will remain  in effect (meaning this test can be used) for the duration of the COVID-19 declaration under Section 564(b)(1) of the Act, 21 U.S.C. section 360bbb-3(b)(1), unless the authorization is terminated or revoked sooner.     Influenza A by PCR NEGATIVE NEGATIVE Final   Influenza B by PCR NEGATIVE NEGATIVE Final    Comment: (NOTE) The Xpert Xpress SARS-CoV-2/FLU/RSV assay is intended as an aid in  the diagnosis of influenza  from Nasopharyngeal swab specimens and  should not be used as a sole basis for treatment. Nasal washings and  aspirates are unacceptable for Xpert Xpress SARS-CoV-2/FLU/RSV  testing.  Fact Sheet for Patients: https://www.moore.com/  Fact Sheet for Healthcare Providers: https://www.young.biz/  This test is not yet approved or cleared by the [place] FDA and  has been authorized for detection and/or diagnosis of SARS-CoV-2 by  FDA under an Emergency Use Authorization (EUA). This EUA will remain  in effect (meaning this test can be used) for the duration of the  Covid-19 declaration under Section 564(b)(1) of the Act, 21  U.S.C. section 360bbb-3(b)(1), unless the authorization is  terminated or revoked. Performed at [facility], [address] [place], [place] [address]      Studies: DG Abdomen 1 View  Result Date: 05/13/2020 CLINICAL DATA:  Evaluate NG tube placement. EXAM: ABDOMEN - 1 VIEW  COMPARISON:  Abdominal radiograph 05/13/2020. FINDINGS: Enteric tube tip and side-port project over the left upper quadrant. Multiple monitoring leads overlie the patient. Patchy opacities lung bases bilaterally. Contrast within the urinary bladder. Mild gaseous distended loops of bowel within the central abdomen. Lumbar spine degenerative changes. IMPRESSION: Enteric tube tip and side-port project over the left upper quadrant. Electronically Signed   By: Annia Belt M.D.   On: 05/13/2020 14:03   CT Abdomen Pelvis W Contrast  Result Date: 05/13/2020 CLINICAL DATA:  Nausea vomiting EXAM: CT ABDOMEN AND PELVIS WITH CONTRAST TECHNIQUE: Multidetector CT imaging of the abdomen and pelvis was performed using the standard protocol following bolus administration of intravenous contrast. CONTRAST:  57mL OMNIPAQUE IOHEXOL 300 MG/ML  SOLN COMPARISON:  July 17, 2019 FINDINGS: Lower chest: No acute abnormality. Hepatobiliary: Markedly thickened gallbladder wall with a complex partially calcified gallstone, unchanged in comparison to prior. Unchanged prominence of the common bile duct measuring up to 7 mm. No new focal hepatic abnormality. Pancreas: Atrophic in appearance. No new peripancreatic fat stranding. Spleen: Unchanged hypoenhancing mass of the superomedial spleen, likely a benign lesion. Adrenals/Urinary Tract: Adrenals are unremarkable. Kidneys enhance symmetrically. No hydronephrosis. Subcentimeter hypodense lesions are too small to accurately characterize. Bladder is unremarkable. Stomach/Bowel: There is dilation of the stomach and proximal duodenum, similar in comparison to prior. There is mild dilation of multiple loops of small bowel with decompression of distal loops of small bowel and colon. Transition point is likely in the mid abdomen (series 47, image 81). The configuration of bowel is unchanged comparison to priors. Severe diverticulosis of the sigmoid rectal colon. No pneumatosis or portal venous  gas. Vascular/Lymphatic: Severe atherosclerotic calcifications. No new suspicious lymphadenopathy. Reproductive: Status post hysterectomy. Other: There is asymmetric enhancement in the LEFT breast with an 11 mm area of enhancement in central outer LEFT breast (series 2, image 21). Musculoskeletal: Age-indeterminate fracture of the RIGHT greater trochanter. There is a fracture line extending into the inter trochanteric femur (series 5, image 70). Grade 1 anterolisthesis of L4-5. IMPRESSION: 1. Small-bowel obstruction with transition point likely in the mid abdomen. No pneumatosis or portal venous gas. Configuration of obstruction is similar in comparison to previous small bowel obstruction in December 2020. 2. Age-indeterminate fracture of the RIGHT greater trochanter. There is a likely fracture line extending into the inter trochanteric femur. Consider further evaluation with a dedicated hip radiographs. 3. Asymmetric nodular enhancement in the LEFT breast. Recommend correlation with physical exam or mammographic history if clinically indicated. 4. Markedly thickened gallbladder wall with a complex partially calcified gallstone, unchanged in comparison to prior  study. These results were called by telephone at the time of interpretation on 05/13/2020 at 12:00 pm to provider [facility] , who verbally acknowledged these results. Electronically Signed   By: Meda Klinefelter MD   On: 05/13/2020 12:05   DG Chest Port 1 View  Result Date: 05/13/2020 CLINICAL DATA:  Chest pain EXAM: PORTABLE CHEST 1 VIEW COMPARISON:  October 05, 2019 FINDINGS: The mediastinal contour and cardiac silhouette are stable. Patient status post prior the the [employer] and CABG. There is no focal infiltrate, pulmonary edema, or pleural effusion. Bony structures are unremarkable. IMPRESSION: No acute cardiopulmonary disease identified. Electronically Signed   By: Sherian Rein M.D.   On: 05/13/2020 09:49   DG Hip Unilat With Pelvis 2-3 Views  Right  Result Date: 05/13/2020 CLINICAL DATA:  Fracture EXAM: DG HIP (WITH OR WITHOUT PELVIS) 2-3V RIGHT COMPARISON:  Same day CT FINDINGS: Revisualization of a nondisplaced fracture of the RIGHT greater trochanter. Fracture line extends into the inter trochanteric proximal RIGHT femur. Osteopenia. Excreted contrast within the renal collecting system. Vascular calcifications. IMPRESSION: Revisualization of a nondisplaced fracture of the RIGHT greater trochanter with fracture line likely extending into the intertrochanteric femur. Evaluation for additional subtle fractures could be accomplished with a dedicated pelvic MRI if clinically desired. Electronically Signed   By: Meda Klinefelter MD   On: 05/13/2020 14:06      Joycelyn Das, MD  [facility] 05/14/2020

## 2020-05-15 LAB — CBC
HCT: 29.5 % — ABNORMAL LOW (ref 36.0–46.0)
Hemoglobin: 8.9 g/dL — ABNORMAL LOW (ref 12.0–15.0)
MCH: 27 pg (ref 26.0–34.0)
MCHC: 30.2 g/dL (ref 30.0–36.0)
MCV: 89.4 fL (ref 80.0–100.0)
Platelets: 149 10*3/uL — ABNORMAL LOW (ref 150–400)
RBC: 3.3 MIL/uL — ABNORMAL LOW (ref 3.87–5.11)
RDW: 17.5 % — ABNORMAL HIGH (ref 11.5–15.5)
WBC: 9.5 10*3/uL (ref 4.0–10.5)
nRBC: 0 % (ref 0.0–0.2)

## 2020-05-15 LAB — BASIC METABOLIC PANEL
Anion gap: 11 (ref 5–15)
BUN: 30 mg/dL — ABNORMAL HIGH (ref 8–23)
CO2: 28 mmol/L (ref 22–32)
Calcium: 8.9 mg/dL (ref 8.9–10.3)
Chloride: 108 mmol/L (ref 98–111)
Creatinine, Ser: 1.17 mg/dL — ABNORMAL HIGH (ref 0.44–1.00)
GFR, Estimated: 40 mL/min — ABNORMAL LOW (ref >60)
Glucose, Bld: 115 mg/dL — ABNORMAL HIGH (ref 70–99)
Potassium: 3.2 mmol/L — ABNORMAL LOW (ref 3.5–5.1)
Sodium: 147 mmol/L — ABNORMAL HIGH (ref 135–145)

## 2020-05-15 LAB — PHOSPHORUS: Phosphorus: 3.4 mg/dL (ref 2.5–4.6)

## 2020-05-15 LAB — MAGNESIUM: Magnesium: 2 mg/dL (ref 1.7–2.4)

## 2020-05-15 MED ORDER — GABAPENTIN 300 MG PO CAPS
600.0000 mg | ORAL_CAPSULE | Freq: Every day | ORAL | Status: DC
  Administered 2020-05-15: 600 mg via ORAL
  Filled 2020-05-15: qty 2

## 2020-05-15 MED ORDER — ALPRAZOLAM 0.5 MG PO TABS: 0.5000 mg | ORAL_TABLET | Freq: Three times a day (TID) | ORAL | Status: DC | PRN

## 2020-05-15 MED ORDER — ACETAMINOPHEN 325 MG PO TABS: 325.0000 mg | ORAL_TABLET | ORAL | Status: DC | PRN

## 2020-05-15 MED ORDER — POTASSIUM CHLORIDE CRYS ER 20 MEQ PO TBCR
40.0000 meq | EXTENDED_RELEASE_TABLET | Freq: Once | ORAL | Status: AC
  Administered 2020-05-15: 40 meq via ORAL
  Filled 2020-05-15: qty 2

## 2020-05-15 MED ORDER — DICLOFENAC SODIUM 1 % EX GEL
4.0000 g | Freq: Four times a day (QID) | CUTANEOUS | Status: DC | PRN
  Filled 2020-05-15: qty 100

## 2020-05-15 MED ORDER — SERTRALINE HCL 25 MG PO TABS
25.0000 mg | ORAL_TABLET | Freq: Every day | ORAL | Status: DC
  Administered 2020-05-16: 25 mg via ORAL
  Filled 2020-05-15 (×2): qty 1

## 2020-05-15 MED ORDER — DIVALPROEX SODIUM 125 MG PO DR TAB
125.0000 mg | DELAYED_RELEASE_TABLET | Freq: Every day | ORAL | Status: DC
  Administered 2020-05-15: 125 mg via ORAL
  Filled 2020-05-15: qty 1

## 2020-05-15 MED ORDER — HYDRALAZINE HCL 50 MG PO TABS
100.0000 mg | ORAL_TABLET | Freq: Three times a day (TID) | ORAL | Status: DC
  Administered 2020-05-15 – 2020-05-16 (×2): 100 mg via ORAL
  Filled 2020-05-15 (×3): qty 2

## 2020-05-15 MED ORDER — AMLODIPINE BESYLATE 10 MG PO TABS
10.0000 mg | ORAL_TABLET | Freq: Every day | ORAL | Status: DC
  Administered 2020-05-15: 10 mg via ORAL
  Filled 2020-05-15: qty 1

## 2020-05-15 MED ORDER — CARVEDILOL 12.5 MG PO TABS
12.5000 mg | ORAL_TABLET | Freq: Two times a day (BID) | ORAL | Status: DC
  Administered 2020-05-15 – 2020-05-16 (×2): 12.5 mg via ORAL
  Filled 2020-05-15 (×2): qty 1

## 2020-05-15 NOTE — Consult Note (Signed)
   [facility] Inpatient Consult   05/15/2020  [place] M Jenison 1926/02/09 [id number]   Patient is currently active with [facility] Care Management for chronic disease and outpatient case management services.  Patient has been engaged by a [facility] CM Nurse, children's.   Will continue to follow for progression and disposition and update community RN of patient inpatient admission.  Of note, [facility] Care Management services does not replace or interfere with any services that are arranged by inpatient case management or social work.  Christophe Louis, MSN, RN [facility] Liaison Nurse Mobile Phone [phone]  Toll free office [phone]

## 2020-05-15 NOTE — Progress Notes (Signed)
PROGRESS NOTE  [name] ZOX:[id number] DOB: 1926/03/14 DOA: 05/13/2020 PCP: Alysia PennaHolwerda, Scott, MD   LOS: 2 days   Brief narrative: As per HPI,  [name] is a 86 y.o. female with medical history significant of recurrent SBO, dementia, HTN, presented to hospital with complaints of diminished appetite and nausea diarrhea and vomiting.  She did have significant vomiting on the morning of presentation.  Patient was then brought in the hospital.  In the ED, a CT scan of the abdomen showed small bowel obstruction and right hip fracture. Surgery and orthopedics were consulted.    Assessment/Plan:  Principal Problem:   SBO (small bowel obstruction) (HCC) Active Problems:   DM (diabetes mellitus) (HCC)   CKD (chronic kidney disease), stage III (HCC)   Alzheimer's dementia with behavioral disturbance (HCC)   S/P right hip fracture  Nausea, vomiting secondary to small bowel obstruction.  CT scan of the abdomen on presentation showed small bowel obstruction with transition point likely in the midabdomen.  General surgery on board.  Patient stated that as he did have a bowel movement yesterday.  NG tube not in place.  Surgery has seen the patient today and recommend clear liquids at this time.  Right hip Fx Likely old fracture. X-ray of the right hip showed nondisplaced fracture of the right wrist trochanter with fracture line extending into the intertrochanteric femur.  Orthopedics notified [facility], Dr. August Saucerean); orthopedics requested CT scan of the hip showed mildly comminuted and mildly displaced fracture of the right femoral greater trochanter with nondisplaced trabecular fracture.  She has been seen by physical therapy and was able to bear weight.  At this time, plan is conservative treatment, will need follow-up with Dr. August Saucerean orthopedics in 1 week after discharge.      CKD3a Closely monitor BMP.  At baseline at this time.  Creatinine of 1.1.  Mild hypokalemia.   Will replace today.  Check levels in a.Sonya.  Magnesium 2.0.  Phosphorus 3.4.  Give 40 mEq p.o. x1.  Dementia Likely at baseline dementia.  Essential HTN Resume p.o. metoprolol, amlodipine and hydralazine from home.  Accelerated at this time.  Hyperlipidemia Statin on hold at this time.  DVT prophylaxis: heparin injection 5,000 Units Start: 05/13/20 2200  Code Status: Full Code  Family Communication: None today.  Spoke with the husband at bedside yesterday..  Status is: Inpatient  Remains inpatient appropriate because:IV treatments appropriate due to intensity of illness or inability to take PO, Inpatient level of care appropriate due to severity of illness and Small bowel obstruction, hip fracture   Dispo: The patient is from: Home              Anticipated d/c is to: SNF versus home with home health, will get PT evaluation               Anticipated d/c date is: 2 days              Patient currently is not medically stable to d/c.   Consultants:  Orthopedics  General surgery  Procedures:  NG tube  Antibiotics:  . none  Anti-infectives (From admission, onward)   None     Subjective: Today, patient was seen and examined at bedside.  Poor historian.  Denies overt pain, nausea, shortness of breath or fever.  She states that as he has had a bowel movement today.  Off NG tube.  Objective: Vitals:   05/15/20 0440 05/15/20 0613  BP: (!) 176/66 (!) 169/62  Pulse: (!) 55   Resp: 18   Temp: 98.8 F (37.1 C)   SpO2: 94%     Intake/Output Summary (Last 24 hours) at 05/15/2020 1117 Last data filed at 05/15/2020 1000 Gross per 24 hour  Intake 831.06 ml  Output 100 ml  Net 731.06 ml   Filed Weights   05/13/20 0801  Weight: 53.1 kg   Body mass index is 19.48 kg/Sonya.   Physical Exam: GENERAL: Patient is alert awake and communicative, pleasantly confused, thinly built  Not in obvious distress. HENT: No scleral pallor or icterus. Pupils equally reactive to  light. Oral mucosa is moist NECK: is supple, no gross swelling noted. CHEST: Clear to auscultation. No crackles or wheezes.  Diminished breath sounds bilaterally.  Sternal scar noted. CVS: S1 and S2 heard, systolic murmur heard.  Regular rate and rhythm.  ABDOMEN: soft, scaphoid, non tender at the time of exam EXTREMITIES:  Non tender right hip CNS: Cranial nerves are intact. No focal motor deficits. SKIN: warm and dry without rashes.  Data Review: I have personally reviewed the following laboratory data and studies,  CBC: Recent Labs  Lab 05/13/20 0824 05/14/20 0317 05/15/20 0335  WBC 11.8* 12.9* 9.5  NEUTROABS 10.6*  --   --   HGB 10.0* 9.3* 8.9*  HCT 31.8* 30.0* 29.5*  MCV 85.9 86.5 89.4  PLT 205 194 149*   Basic Metabolic Panel: Recent Labs  Lab 05/13/20 0824 05/14/20 0317 05/15/20 0335  NA 142 145 147*  K 4.2 3.5 3.2*  CL 102 101 108  CO2 27 30 28   GLUCOSE 167* 147* 115*  BUN 22 27* 30*  CREATININE 1.32* 1.38* 1.17*  CALCIUM 9.6 9.3 8.9  MG  --   --  2.0  PHOS  --   --  3.4   Liver Function Tests: Recent Labs  Lab 05/13/20 0824 05/14/20 0317  AST 27 16  ALT 12 9  ALKPHOS 69 63  BILITOT 0.8 1.1  PROT 7.8 7.0  ALBUMIN 4.2 3.6   Recent Labs  Lab 05/13/20 0824  LIPASE 32   No results for input(s): AMMONIA in the last 168 hours. Cardiac Enzymes: No results for input(s): CKTOTAL, CKMB, CKMBINDEX, TROPONINI in the last 168 hours. BNP (last 3 results) Recent Labs    07/16/19 2240  BNP 278.7*    ProBNP (last 3 results) No results for input(s): PROBNP in the last 8760 hours.  CBG: No results for input(s): GLUCAP in the last 168 hours. Recent Results (from the past 240 hour(s))  Respiratory Panel by RT PCR (Flu A&B, Covid) - Nasopharyngeal Swab     Status: None   Collection Time: 05/13/20 11:44 AM   Specimen: Nasopharyngeal Swab  Result Value Ref Range Status   SARS Coronavirus 2 by RT PCR NEGATIVE NEGATIVE Final    Comment: (NOTE) SARS-CoV-2  target nucleic acids are NOT DETECTED.  The SARS-CoV-2 RNA is generally detectable in upper respiratoy specimens during the acute phase of infection. The lowest concentration of SARS-CoV-2 viral copies this assay can detect is 131 copies/mL. A negative result does not preclude SARS-Cov-2 infection and should not be used as the sole basis for treatment or other patient management decisions. A negative result may occur with  improper specimen collection/handling, submission of specimen other than nasopharyngeal swab, presence of viral mutation(s) within the areas targeted by this assay, and inadequate number of viral copies (<131 copies/mL). A negative result must be combined with clinical observations, patient history, and epidemiological information. The  expected result is Negative.  Fact Sheet for Patients:  https://www.moore.com/  Fact Sheet for Healthcare Providers:  https://www.young.biz/  This test is no t yet approved or cleared by the [place] FDA and  has been authorized for detection and/or diagnosis of SARS-CoV-2 by FDA under an Emergency Use Authorization (EUA). This EUA will remain  in effect (meaning this test can be used) for the duration of the COVID-19 declaration under Section 564(b)(1) of the Act, 21 U.S.C. section 360bbb-3(b)(1), unless the authorization is terminated or revoked sooner.     Influenza A by PCR NEGATIVE NEGATIVE Final   Influenza B by PCR NEGATIVE NEGATIVE Final    Comment: (NOTE) The Xpert Xpress SARS-CoV-2/FLU/RSV assay is intended as an aid in  the diagnosis of influenza from Nasopharyngeal swab specimens and  should not be used as a sole basis for treatment. Nasal washings and  aspirates are unacceptable for Xpert Xpress SARS-CoV-2/FLU/RSV  testing.  Fact Sheet for Patients: https://www.moore.com/  Fact Sheet for Healthcare  Providers: https://www.young.biz/  This test is not yet approved or cleared by the [place] FDA and  has been authorized for detection and/or diagnosis of SARS-CoV-2 by  FDA under an Emergency Use Authorization (EUA). This EUA will remain  in effect (meaning this test can be used) for the duration of the  Covid-19 declaration under Section 564(b)(1) of the Act, 21  U.S.C. section 360bbb-3(b)(1), unless the authorization is  terminated or revoked. Performed at [facility], [address], [place], [place] [address]      Studies: DG Abdomen 1 View  Result Date: 05/13/2020 CLINICAL DATA:  Evaluate NG tube placement. EXAM: ABDOMEN - 1 VIEW COMPARISON:  Abdominal radiograph 05/13/2020. FINDINGS: Enteric tube tip and side-port project over the left upper quadrant. Multiple monitoring leads overlie the patient. Patchy opacities lung bases bilaterally. Contrast within the urinary bladder. Mild gaseous distended loops of bowel within the central abdomen. Lumbar spine degenerative changes. IMPRESSION: Enteric tube tip and side-port project over the left upper quadrant. Electronically Signed   By: Annia Belt Sonya.D.   On: 05/13/2020 14:03   CT Abdomen Pelvis W Contrast  Result Date: 05/13/2020 CLINICAL DATA:  Nausea vomiting EXAM: CT ABDOMEN AND PELVIS WITH CONTRAST TECHNIQUE: Multidetector CT imaging of the abdomen and pelvis was performed using the standard protocol following bolus administration of intravenous contrast. CONTRAST:  75mL OMNIPAQUE IOHEXOL 300 MG/ML  SOLN COMPARISON:  July 17, 2019 FINDINGS: Lower chest: No acute abnormality. Hepatobiliary: Markedly thickened gallbladder wall with a complex partially calcified gallstone, unchanged in comparison to prior. Unchanged prominence of the common bile duct measuring up to 7 mm. No new focal hepatic abnormality. Pancreas: Atrophic in appearance. No new peripancreatic fat stranding. Spleen: Unchanged  hypoenhancing mass of the superomedial spleen, likely a benign lesion. Adrenals/Urinary Tract: Adrenals are unremarkable. Kidneys enhance symmetrically. No hydronephrosis. Subcentimeter hypodense lesions are too small to accurately characterize. Bladder is unremarkable. Stomach/Bowel: There is dilation of the stomach and proximal duodenum, similar in comparison to prior. There is mild dilation of multiple loops of small bowel with decompression of distal loops of small bowel and colon. Transition point is likely in the mid abdomen (series 47, image 81). The configuration of bowel is unchanged comparison to priors. Severe diverticulosis of the sigmoid rectal colon. No pneumatosis or portal venous gas. Vascular/Lymphatic: Severe atherosclerotic calcifications. No new suspicious lymphadenopathy. Reproductive: Status post hysterectomy. Other: There is asymmetric enhancement in the LEFT breast with an 11 mm area of enhancement in  central outer LEFT breast (series 2, image 21). Musculoskeletal: Age-indeterminate fracture of the RIGHT greater trochanter. There is a fracture line extending into the inter trochanteric femur (series 5, image 70). Grade 1 anterolisthesis of L4-5. IMPRESSION: 1. Small-bowel obstruction with transition point likely in the mid abdomen. No pneumatosis or portal venous gas. Configuration of obstruction is similar in comparison to previous small bowel obstruction in December 2020. 2. Age-indeterminate fracture of the RIGHT greater trochanter. There is a likely fracture line extending into the inter trochanteric femur. Consider further evaluation with a dedicated hip radiographs. 3. Asymmetric nodular enhancement in the LEFT breast. Recommend correlation with physical exam or mammographic history if clinically indicated. 4. Markedly thickened gallbladder wall with a complex partially calcified gallstone, unchanged in comparison to prior study. These results were called by telephone at the time of  interpretation on 05/13/2020 at 12:00 pm to provider [facility] , who verbally acknowledged these results. Electronically Signed   By: Meda Klinefelter MD   On: 05/13/2020 12:05   CT HIP RIGHT WO CONTRAST  Result Date: 05/14/2020 CLINICAL DATA:  Hip pain following injury.  Fracture suspected. EXAM: CT OF THE RIGHT HIP WITHOUT CONTRAST TECHNIQUE: Multidetector CT imaging of the right hip was performed according to the standard protocol. Multiplanar CT image reconstructions were also generated. COMPARISON:  Radiographs 05/13/2020. Pelvic CT 05/13/2020 and 07/17/2019. FINDINGS: Bones/Joint/Cartilage Examination is limited to the right hip and inferior right hemipelvis. Again demonstrated is a mildly comminuted and mildly displaced fracture of the right greater trochanter. Linear sclerosis traverses the intertrochanteric region posteriorly, suspicious for a nondisplaced trabecular fracture. Of note, this linear sclerosis is present in the intertrochanteric regions of both proximal femurs on the pelvic CT done yesterday and is new compared with the prior CT of 10 months ago. No evidence of femoral head avascular necrosis or significant hip joint arthropathy. Small right hip joint effusion. Ligaments Suboptimally assessed by CT. Muscles and Tendons The gluteus tendons insert on the greater trochanteric fracture. No tendon tear or focal muscular hematoma. Soft tissues No focal periarticular hematoma. Sigmoid diverticulosis again noted. There is contrast in the bladder related to previous CT. IMPRESSION: 1. Mildly comminuted and mildly displaced fracture of the right femoral greater trochanter. Linear sclerosis traverses the intertrochanteric region posteriorly, suspicious for a nondisplaced trabecular fracture. See above discussion; similar findings are present on the left, both new from previous CT 07/17/2019. 2. No evidence of femoral head avascular necrosis or significant hip joint arthropathy. Electronically  Signed   By: Carey Bullocks Sonya.D.   On: 05/14/2020 09:30   DG Abd Portable 1V-Small Bowel Obstruction Protocol-initial, 8 hr delay  Result Date: 05/14/2020 CLINICAL DATA:  Small-bowel obstruction. EXAM: PORTABLE ABDOMEN - 1 VIEW COMPARISON:  None. FINDINGS: Enteric contrast is now seen within the distal colon and rectum. Note is made of numerous diverticula within the distal colon. Nasogastric tube tip overlies the expected distal body of the stomach. The abdominal gas pattern is normal. No gross free intraperitoneal gas. IMPRESSION: Resolved small-bowel obstruction. Enteric contrast now seen within the distal colon. Electronically Signed   By: Helyn Numbers MD   On: 05/14/2020 21:00   DG Hip Unilat With Pelvis 2-3 Views Right  Result Date: 05/13/2020 CLINICAL DATA:  Fracture EXAM: DG HIP (WITH OR WITHOUT PELVIS) 2-3V RIGHT COMPARISON:  Same day CT FINDINGS: Revisualization of a nondisplaced fracture of the RIGHT greater trochanter. Fracture line extends into the inter trochanteric proximal RIGHT femur. Osteopenia. Excreted contrast within the renal collecting  system. Vascular calcifications. IMPRESSION: Revisualization of a nondisplaced fracture of the RIGHT greater trochanter with fracture line likely extending into the intertrochanteric femur. Evaluation for additional subtle fractures could be accomplished with a dedicated pelvic MRI if clinically desired. Electronically Signed   By: Meda Klinefelter MD   On: 05/13/2020 14:06      Joycelyn Das, MD  [facility] 05/15/2020

## 2020-05-15 NOTE — Progress Notes (Signed)
Subjective No acute events.  She is resting comfortably.  She denies any abdominal pain. Denies nausea or vomiting. Denies BM though several are documented the last 24 hours.   Objective: Vital signs in last 24 hours: Temp:  [98 F (36.7 C)-98.8 F (37.1 C)] 98.8 F (37.1 C) (10/11 0440) Pulse Rate:  [55-61] 55 (10/11 0440) Resp:  [18] 18 (10/11 0440) BP: (110-195)/(61-70) 169/62 (10/11 0613) SpO2:  [93 %-99 %] 94 % (10/11 0440) Last BM Date: 05/14/20  Intake/Output from previous day: 10/10 0701 - 10/11 0700 In: 730.9 [I.V.:640.9; NG/GT:90] Out: 325 [Urine:100; Emesis/NG output:225] Intake/Output this shift: No intake/output data recorded.  Gen: NAD, comfortable CV: RRR Pulm: Normal work of breathing Abd: Soft, nontender throughout; non-distended, No rebound nor guarding. Ext: SCDs in place  Lab Results: CBC  Recent Labs    05/14/20 0317 05/15/20 0335  WBC 12.9* 9.5  HGB 9.3* 8.9*  HCT 30.0* 29.5*  PLT 194 149*   BMET Recent Labs    05/14/20 0317 05/15/20 0335  NA 145 147*  K 3.5 3.2*  CL 101 108  CO2 30 28  GLUCOSE 147* 115*  BUN 27* 30*  CREATININE 1.38* 1.17*  CALCIUM 9.3 8.9   PT/INR No results for input(s): LABPROT, INR in the last 72 hours. ABG No results for input(s): PHART, HCO3 in the last 72 hours.  Invalid input(s): PCO2, PO2  Studies/Results:  Anti-infectives: Anti-infectives (From admission, onward)   None       Assessment/Plan: Patient Active Problem List   Diagnosis Date Noted   S/P right hip fracture 05/14/2020   DNR (do not resuscitate) 10/07/2019   Alzheimer's dementia with behavioral disturbance (HCC)    Palliative care encounter    Failure to thrive (0-17) 10/06/2019   Weakness generalized 10/05/2019   Hypernatremia 07/18/2019   Hypokalemia 07/18/2019   Elevated troponin 07/17/2019   Acute lower UTI 07/17/2019   Positive D dimer 07/17/2019   Normocytic anemia 07/17/2019   Cholelithiasis 01/02/2019    CKD (chronic kidney disease), stage III (HCC) 10/21/2017   Delirium due to another medical condition 12/18/2016   SBO (small bowel obstruction) (HCC) 12/18/2016   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 Dezeeuw is a pleasant 89yoF with HTN, HLD, DM, Dementia/Alz, CKD, CAD, PAD here with SBO in setting of multiple prior abdominal surgical procedures - last being exlap/SBR/appendectomy 2018 with Dr. Magnus Ivan for SBO where she was found to have closed loop obstruction   -Clinically she is doing well. Abdomen is flat/non-distendend. She is now having some bowel function -radiographic resolution of SBO yesterday - start clear liquid diet and advance as tolerated to SOFT - no acute surgical needs.   FEN: CLD ID: none VTE: SQH Foley: none    LOS: 2 days   Hosie Spangle, [facility] Use [email or web address] to contact on call provider

## 2020-05-15 NOTE — Plan of Care (Signed)
  Problem: Coping: Goal: Level of anxiety will decrease 05/15/2020 0414 by Kizzie Bane, RN Outcome: Progressing 05/15/2020 0414 by Kizzie Bane, RN Outcome: Progressing   Problem: Elimination: Goal: Will not experience complications related to bowel motility 05/15/2020 0414 by Kizzie Bane, RN Outcome: Progressing 05/15/2020 0414 by Kizzie Bane, RN Outcome: Progressing Goal: Will not experience complications related to urinary retention 05/15/2020 0414 by Kizzie Bane, RN Outcome: Progressing 05/15/2020 0414 by Kizzie Bane, RN Outcome: Progressing   Problem: Pain Managment: Goal: General experience of comfort will improve 05/15/2020 0414 by Kizzie Bane, RN Outcome: Progressing 05/15/2020 0414 by Kizzie Bane, RN Outcome: Progressing   Problem: Safety: Goal: Ability to remain free from injury will improve 05/15/2020 0414 by Kizzie Bane, RN Outcome: Progressing 05/15/2020 0414 by Kizzie Bane, RN Outcome: Progressing   Problem: Skin Integrity: Goal: Risk for impaired skin integrity will decrease 05/15/2020 0414 by Kizzie Bane, RN Outcome: Progressing 05/15/2020 0414 by Kizzie Bane, RN Outcome: Progressing

## 2020-05-15 NOTE — Plan of Care (Signed)

## 2020-05-16 ENCOUNTER — Encounter (HOSPITAL_COMMUNITY): Payer: Self-pay | Admitting: Internal Medicine

## 2020-05-16 LAB — BASIC METABOLIC PANEL
Anion gap: 10 (ref 5–15)
BUN: 24 mg/dL — ABNORMAL HIGH (ref 8–23)
CO2: 27 mmol/L (ref 22–32)
Calcium: 8.8 mg/dL — ABNORMAL LOW (ref 8.9–10.3)
Chloride: 106 mmol/L (ref 98–111)
Creatinine, Ser: 1.15 mg/dL — ABNORMAL HIGH (ref 0.44–1.00)
GFR, Estimated: 41 mL/min — ABNORMAL LOW (ref >60)
Glucose, Bld: 121 mg/dL — ABNORMAL HIGH (ref 70–99)
Potassium: 3.6 mmol/L (ref 3.5–5.1)
Sodium: 143 mmol/L (ref 135–145)

## 2020-05-16 LAB — CBC
HCT: 30.1 % — ABNORMAL LOW (ref 36.0–46.0)
Hemoglobin: 9.2 g/dL — ABNORMAL LOW (ref 12.0–15.0)
MCH: 27.2 pg (ref 26.0–34.0)
MCHC: 30.6 g/dL (ref 30.0–36.0)
MCV: 89.1 fL (ref 80.0–100.0)
Platelets: 162 10*3/uL (ref 150–400)
RBC: 3.38 MIL/uL — ABNORMAL LOW (ref 3.87–5.11)
RDW: 17.2 % — ABNORMAL HIGH (ref 11.5–15.5)
WBC: 8.5 10*3/uL (ref 4.0–10.5)
nRBC: 0 % (ref 0.0–0.2)

## 2020-05-16 LAB — MAGNESIUM: Magnesium: 2.1 mg/dL (ref 1.7–2.4)

## 2020-05-16 MED ORDER — ONDANSETRON HCL 4 MG PO TABS: 4.0000 mg | ORAL_TABLET | Freq: Four times a day (QID) | ORAL | 0 refills | Status: DC | PRN

## 2020-05-16 NOTE — Care Management Important Message (Signed)
Important Message  Patient Details IM Letter given to the Patient Name: [place] Sonya Chavez MRN: [id number] Date of Birth: [birth year]   Medicare Important Message Given:  Yes     Sonya Chavez 05/16/2020, 11:15 AM

## 2020-05-16 NOTE — Progress Notes (Signed)
Physical Therapy Treatment Patient Details Name: Sonya Chavez MRN: [id number] DOB: Nov 27, 1925 Today's Date: 05/16/2020    History of Present Illness Pt admitted with N/V and dx with SBO.  Pt with hx of DM, MI, CAD, CABG and dementia.  Pt with noted old R hip fx on film but cleared by orthopedic consult as WBAT    PT Comments    Pt in bed with 6 blankets on her still c/o feeling cold.  Spouse at bed side.  Assisted to EOB found pt to be incont loose stools.  Assisted to bathroom.  Unsteady gait.  Required assist with balance.  Assisted with hygiene as pt was unable to self perform.  Max c/o feeling tired, weak.  Attempted amb in hallway.  General Gait Details: decreased amb distance this seesion due to MAX c/o feeling cold, tired, fatigue.  Also pt using SPC but not safely/correctly allow it to drag behind.  More assist was offered by therapist HHA. Returned to room via computer chair that was in hallway as pt was unable to walk further.  Last session she amb over 200 feet.  Assisted back to bed.    Follow Up Recommendations  Home health PT     Equipment Recommendations  None recommended by PT    Recommendations for Other Services       Precautions / Restrictions Precautions Precautions: Fall Restrictions Weight Bearing Restrictions: No RLE Weight Bearing: Weight bearing as tolerated    Mobility  Bed Mobility Overal bed mobility: Needs Assistance Bed Mobility: Supine to Sit;Sit to Supine     Supine to sit: Supervision Sit to supine: Supervision   General bed mobility comments: assist with multiple blankets  Transfers Overall transfer level: Needs assistance Equipment used: 1 person hand held assist Transfers: Sit to/from Stand;Stand Pivot Transfers Sit to Stand: Min assist Stand pivot transfers: Min assist       General transfer comment: min assist to bring wt up and fwd and to balance in initial standing.  Also assisted with a toilet transfer which required  Min Assist.  Ambulation/Gait Ambulation/Gait assistance: Min assist Gait Distance (Feet): 45 Feet Assistive device: 1 person hand held assist;Straight cane Gait Pattern/deviations: Step-through pattern;Decreased step length - right;Decreased step length - left;Shuffle;Narrow base of support Gait velocity: decreased   General Gait Details: decreased amb distance this seesion due to MAX c/o feeling cold, tired, fatigue.  Also pt using SPC but not safely/correctly allow it to drag behind.  More assist was offered by therapist HHA.   Stairs             Wheelchair Mobility    Modified Rankin (Stroke Patients Only)       Balance                                            Cognition Arousal/Alertness: Awake/alert Behavior During Therapy: WFL for tasks assessed/performed Overall Cognitive Status: Within Functional Limits for tasks assessed                                 General Comments: Hx dementia but following all commands and conversing with spouse appropriately      Exercises      General Comments        Pertinent Vitals/Pain Pain Assessment: No/denies pain    Home  Living                      Prior Function            PT Goals (current goals can now be found in the care plan section) Progress towards PT goals: Progressing toward goals    Frequency    Min 3X/week      PT Plan Current plan remains appropriate    Co-evaluation              AM-PAC PT "6 Clicks" Mobility   Outcome Measure  Help needed turning from your back to your side while in a flat bed without using bedrails?: A Little Help needed moving from lying on your back to sitting on the side of a flat bed without using bedrails?: A Little Help needed moving to and from a bed to a chair (including a wheelchair)?: A Little Help needed standing up from a chair using your arms (e.g., wheelchair or bedside chair)?: A Little Help needed to walk  in hospital room?: A Little Help needed climbing 3-5 steps with a railing? : A Lot 6 Click Score: 17    End of Session Equipment Utilized During Treatment: Gait belt Activity Tolerance: Patient limited by fatigue;Other (comment) (did not perform as well as last PT session) Patient left: in bed;with call bell/phone within reach Nurse Communication: Mobility status PT Visit Diagnosis: Unsteadiness on feet (R26.81);Muscle weakness (generalized) (M62.81)     Time: 4967-5916 PT Time Calculation (min) (ACUTE ONLY): 25 min  Charges:  $Gait Training: 8-22 mins $Therapeutic Activity: 8-22 mins                     Felecia Shelling  PTA Acute  Rehabilitation Services Pager      [phone] Office      [phone]

## 2020-05-16 NOTE — Discharge Summary (Signed)
Physician Discharge Summary  [name] ZOX:[id number] DOB: 1926/02/14 DOA: 05/13/2020  PCP: Alysia PennaHolwerda, Scott, MD  Admit date: 05/13/2020 Discharge date: 05/16/2020  Admitted From: Home  [facility]  Recommendations for Outpatient Follow-Up:   . Follow up with your primary care provider in one week.  . Check CBC, BMP, magnesium in the next visit . Follow-up with orthopedics in 1 week after discharge.  Discharge Diagnosis:   Principal Problem:   SBO (small bowel obstruction) (HCC) Active Problems:   DM (diabetes mellitus) (HCC)   CKD (chronic kidney disease), stage III (HCC)   Alzheimer's dementia with behavioral disturbance (HCC)   S/P right hip fracture   Discharge Condition: Improved.  Diet recommendation: Low sodium, heart healthy.  Carbohydrate-modified.  Wound care: None.  Code status: Full.   History of Present Illness:   [name] is a 86 y.o. female with medical history significant of recurrent SBO, dementia, HTN, presented to hospital with complaints of diminished appetite and nausea diarrhea and vomiting.  She did have significant vomiting on the morning of presentation.  Patient was then brought in the hospital.  In the ED, a CT scan of the abdomen showed small bowel obstruction and right hip fracture. Surgery and orthopedics were consulted.     Hospital Course:   Following conditions were addressed during hospitalization as listed below,  Nausea, vomiting secondary to small bowel obstruction.  CT scan of the abdomen on presentation showed small bowel obstruction with transition point likely in the midabdomen.  General surgery was consulted.  Patient underwent conservative treatment.  Still he had NG tube in place.  Patient improved significantly on conservative treatment.  She has had bowel movements and was tolerating oral diet.     Right hip Fx Likely old fracture. X-ray of the right hip showed  nondisplaced fracture of the right wrist trochanter with fracture line extending into the intertrochanteric femur.  Orthopedics notified [facility], Dr. August Saucerean); orthopedics requested CT scan of the hip which showed mildly comminuted and mildly displaced fracture of the right femoral greater trochanter with nondisplaced trabecular fracture.  She was seen by physical therapy and was able to bear weight.  At this time, plan is conservative treatment, will need follow-up with Dr. August Saucerean orthopedics in 1 week after discharge.      CKD3a Remained at baseline.   Mild hypokalemia.  Improved with replacement.   Potassium was 3.6 prior to discharge.  Magnesium of 2.1.  Dementia Likely at baseline at this time.   Essential HTN Resume p.o. metoprolol, amlodipine and hydralazine on discharge.   Hyperlipidemia Resume statin on discharge  Disposition.  At this time, patient is stable for disposition. Spoke with husband at bedside.   Medical Consultants:    Orthopedics  General surgery  Procedures:    NG tube placement and removal Subjective:   Today, patient feels okay.  Denies any nausea, vomiting, abdominal pain, shortness of breath or fever poor historian.  Discharge Exam:   Vitals:   05/16/20 0605 05/16/20 1320  BP: (!) 165/55 (!) 135/49  Pulse: (!) 56 60  Resp: 17 18  Temp: (!) 97.2 F (36.2 C) 98.1 F (36.7 C)  SpO2: 97% 97%   Vitals:   05/15/20 2100 05/15/20 2121 05/16/20 0605 05/16/20 1320  BP: (!) 156/56  (!) 165/55 (!) 135/49  Pulse: (!) 58 (!) 58 (!) 56 60  Resp: 16 16 17 18   Temp: (!) 97.5 F (36.4 C) (!) 97.3 F (36.3  C) (!) 97.2 F (36.2 C) 98.1 F (36.7 C)  TempSrc: Axillary Axillary Axillary   SpO2: 96% 96% 97% 97%  Weight:      Height:       General: Alert awake, not in obvious distress, pleasantly confused, thinly built, elderly female HENT: pupils equally reacting to light,  No scleral pallor or icterus noted. Oral mucosa is moist.  Chest:  Clear  breath sounds.  Diminished breath sounds bilaterally. No crackles or wheezes.  CVS: S1 &S2 heard.  Systolic murmur noted.  Regular rate and rhythm.  Sternal scar noted. Abdomen: Soft, nontender, nondistended.  Bowel sounds are heard.  Scaphoid abdomen. Extremities: No cyanosis, clubbing or edema.  Peripheral pulses are palpable. Psych: Alert, awake and communicative CNS:  No cranial nerve deficits.  Power equal in all extremities.   Skin: Warm and dry.  No rashes noted.  The results of significant diagnostics from this hospitalization (including imaging, microbiology, ancillary and laboratory) are listed below for reference.     Diagnostic Studies:   DG Abdomen 1 View  Result Date: 05/13/2020 CLINICAL DATA:  Evaluate NG tube placement. EXAM: ABDOMEN - 1 VIEW COMPARISON:  Abdominal radiograph 05/13/2020. FINDINGS: Enteric tube tip and side-port project over the left upper quadrant. Multiple monitoring leads overlie the patient. Patchy opacities lung bases bilaterally. Contrast within the urinary bladder. Mild gaseous distended loops of bowel within the central abdomen. Lumbar spine degenerative changes. IMPRESSION: Enteric tube tip and side-port project over the left upper quadrant. Electronically Signed   By: Annia Belt M.D.   On: 05/13/2020 14:03   CT Abdomen Pelvis W Contrast  Result Date: 05/13/2020 CLINICAL DATA:  Nausea vomiting EXAM: CT ABDOMEN AND PELVIS WITH CONTRAST TECHNIQUE: Multidetector CT imaging of the abdomen and pelvis was performed using the standard protocol following bolus administration of intravenous contrast. CONTRAST:  55mL OMNIPAQUE IOHEXOL 300 MG/ML  SOLN COMPARISON:  July 17, 2019 FINDINGS: Lower chest: No acute abnormality. Hepatobiliary: Markedly thickened gallbladder wall with a complex partially calcified gallstone, unchanged in comparison to prior. Unchanged prominence of the common bile duct measuring up to 7 mm. No new focal hepatic abnormality. Pancreas:  Atrophic in appearance. No new peripancreatic fat stranding. Spleen: Unchanged hypoenhancing mass of the superomedial spleen, likely a benign lesion. Adrenals/Urinary Tract: Adrenals are unremarkable. Kidneys enhance symmetrically. No hydronephrosis. Subcentimeter hypodense lesions are too small to accurately characterize. Bladder is unremarkable. Stomach/Bowel: There is dilation of the stomach and proximal duodenum, similar in comparison to prior. There is mild dilation of multiple loops of small bowel with decompression of distal loops of small bowel and colon. Transition point is likely in the mid abdomen (series 47, image 81). The configuration of bowel is unchanged comparison to priors. Severe diverticulosis of the sigmoid rectal colon. No pneumatosis or portal venous gas. Vascular/Lymphatic: Severe atherosclerotic calcifications. No new suspicious lymphadenopathy. Reproductive: Status post hysterectomy. Other: There is asymmetric enhancement in the LEFT breast with an 11 mm area of enhancement in central outer LEFT breast (series 2, image 21). Musculoskeletal: Age-indeterminate fracture of the RIGHT greater trochanter. There is a fracture line extending into the inter trochanteric femur (series 5, image 70). Grade 1 anterolisthesis of L4-5. IMPRESSION: 1. Small-bowel obstruction with transition point likely in the mid abdomen. No pneumatosis or portal venous gas. Configuration of obstruction is similar in comparison to previous small bowel obstruction in December 2020. 2. Age-indeterminate fracture of the RIGHT greater trochanter. There is a likely fracture line extending into the inter trochanteric femur. Consider  further evaluation with a dedicated hip radiographs. 3. Asymmetric nodular enhancement in the LEFT breast. Recommend correlation with physical exam or mammographic history if clinically indicated. 4. Markedly thickened gallbladder wall with a complex partially calcified gallstone, unchanged in  comparison to prior study. These results were called by telephone at the time of interpretation on 05/13/2020 at 12:00 pm to provider [facility] , who verbally acknowledged these results. Electronically Signed   By: Meda Klinefelter MD   On: 05/13/2020 12:05   CT HIP RIGHT WO CONTRAST  Result Date: 05/14/2020 CLINICAL DATA:  Hip pain following injury.  Fracture suspected. EXAM: CT OF THE RIGHT HIP WITHOUT CONTRAST TECHNIQUE: Multidetector CT imaging of the right hip was performed according to the standard protocol. Multiplanar CT image reconstructions were also generated. COMPARISON:  Radiographs 05/13/2020. Pelvic CT 05/13/2020 and 07/17/2019. FINDINGS: Bones/Joint/Cartilage Examination is limited to the right hip and inferior right hemipelvis. Again demonstrated is a mildly comminuted and mildly displaced fracture of the right greater trochanter. Linear sclerosis traverses the intertrochanteric region posteriorly, suspicious for a nondisplaced trabecular fracture. Of note, this linear sclerosis is present in the intertrochanteric regions of both proximal femurs on the pelvic CT done yesterday and is new compared with the prior CT of 10 months ago. No evidence of femoral head avascular necrosis or significant hip joint arthropathy. Small right hip joint effusion. Ligaments Suboptimally assessed by CT. Muscles and Tendons The gluteus tendons insert on the greater trochanteric fracture. No tendon tear or focal muscular hematoma. Soft tissues No focal periarticular hematoma. Sigmoid diverticulosis again noted. There is contrast in the bladder related to previous CT. IMPRESSION: 1. Mildly comminuted and mildly displaced fracture of the right femoral greater trochanter. Linear sclerosis traverses the intertrochanteric region posteriorly, suspicious for a nondisplaced trabecular fracture. See above discussion; similar findings are present on the left, both new from previous CT 07/17/2019. 2. No evidence of  femoral head avascular necrosis or significant hip joint arthropathy. Electronically Signed   By: Carey Bullocks M.D.   On: 05/14/2020 09:30   DG Chest Port 1 View  Result Date: 05/13/2020 CLINICAL DATA:  Chest pain EXAM: PORTABLE CHEST 1 VIEW COMPARISON:  October 05, 2019 FINDINGS: The mediastinal contour and cardiac silhouette are stable. Patient status post prior the the [employer] and CABG. There is no focal infiltrate, pulmonary edema, or pleural effusion. Bony structures are unremarkable. IMPRESSION: No acute cardiopulmonary disease identified. Electronically Signed   By: Sherian Rein M.D.   On: 05/13/2020 09:49   DG Abd Portable 1V-Small Bowel Obstruction Protocol-initial, 8 hr delay  Result Date: 05/14/2020 CLINICAL DATA:  Small-bowel obstruction. EXAM: PORTABLE ABDOMEN - 1 VIEW COMPARISON:  None. FINDINGS: Enteric contrast is now seen within the distal colon and rectum. Note is made of numerous diverticula within the distal colon. Nasogastric tube tip overlies the expected distal body of the stomach. The abdominal gas pattern is normal. No gross free intraperitoneal gas. IMPRESSION: Resolved small-bowel obstruction. Enteric contrast now seen within the distal colon. Electronically Signed   By: Helyn Numbers MD   On: 05/14/2020 21:00   DG Hip Unilat With Pelvis 2-3 Views Right  Result Date: 05/13/2020 CLINICAL DATA:  Fracture EXAM: DG HIP (WITH OR WITHOUT PELVIS) 2-3V RIGHT COMPARISON:  Same day CT FINDINGS: Revisualization of a nondisplaced fracture of the RIGHT greater trochanter. Fracture line extends into the inter trochanteric proximal RIGHT femur. Osteopenia. Excreted contrast within the renal collecting system. Vascular calcifications. IMPRESSION: Revisualization of a nondisplaced fracture of the RIGHT  greater trochanter with fracture line likely extending into the intertrochanteric femur. Evaluation for additional subtle fractures could be accomplished with a dedicated pelvic MRI if clinically  desired. Electronically Signed   By: Meda Klinefelter MD   On: 05/13/2020 14:06     Labs:   Basic Metabolic Panel: Recent Labs  Lab 05/13/20 3244 05/13/20 0102 05/14/20 7253 05/14/20 0317 05/15/20 0335 05/16/20 0325  NA 142  --  145  --  147* 143  K 4.2   < > 3.5   < > 3.2* 3.6  CL 102  --  101  --  108 106  CO2 27  --  30  --  28 27  GLUCOSE 167*  --  147*  --  115* 121*  BUN 22  --  27*  --  30* 24*  CREATININE 1.32*  --  1.38*  --  1.17* 1.15*  CALCIUM 9.6  --  9.3  --  8.9 8.8*  MG  --   --   --   --  2.0 2.1  PHOS  --   --   --   --  3.4  --    < > = values in this interval not displayed.   GFR Estimated Creatinine Clearance: 26.2 mL/min (A) (by C-G formula based on SCr of 1.15 mg/dL (H)). Liver Function Tests: Recent Labs  Lab 05/13/20 0824 05/14/20 0317  AST 27 16  ALT 12 9  ALKPHOS 69 63  BILITOT 0.8 1.1  PROT 7.8 7.0  ALBUMIN 4.2 3.6   Recent Labs  Lab 05/13/20 0824  LIPASE 32   No results for input(s): AMMONIA in the last 168 hours. Coagulation profile No results for input(s): INR, PROTIME in the last 168 hours.  CBC: Recent Labs  Lab 05/13/20 0824 05/14/20 0317 05/15/20 0335 05/16/20 0325  WBC 11.8* 12.9* 9.5 8.5  NEUTROABS 10.6*  --   --   --   HGB 10.0* 9.3* 8.9* 9.2*  HCT 31.8* 30.0* 29.5* 30.1*  MCV 85.9 86.5 89.4 89.1  PLT 205 194 149* 162   Cardiac Enzymes: No results for input(s): CKTOTAL, CKMB, CKMBINDEX, TROPONINI in the last 168 hours. BNP: Invalid input(s): POCBNP CBG: No results for input(s): GLUCAP in the last 168 hours. D-Dimer No results for input(s): DDIMER in the last 72 hours. Hgb A1c No results for input(s): HGBA1C in the last 72 hours. Lipid Profile No results for input(s): CHOL, HDL, LDLCALC, TRIG, CHOLHDL, LDLDIRECT in the last 72 hours. Thyroid function studies No results for input(s): TSH, T4TOTAL, T3FREE, THYROIDAB in the last 72 hours.  Invalid input(s): FREET3 Anemia work up No results for  input(s): VITAMINB12, FOLATE, FERRITIN, TIBC, IRON, RETICCTPCT in the last 72 hours. Microbiology Recent Results (from the past 240 hour(s))  Respiratory Panel by RT PCR (Flu A&B, Covid) - Nasopharyngeal Swab     Status: None   Collection Time: 05/13/20 11:44 AM   Specimen: Nasopharyngeal Swab  Result Value Ref Range Status   SARS Coronavirus 2 by RT PCR NEGATIVE NEGATIVE Final    Comment: (NOTE) SARS-CoV-2 target nucleic acids are NOT DETECTED.  The SARS-CoV-2 RNA is generally detectable in upper respiratoy specimens during the acute phase of infection. The lowest concentration of SARS-CoV-2 viral copies this assay can detect is 131 copies/mL. A negative result does not preclude SARS-Cov-2 infection and should not be used as the sole basis for treatment or other patient management decisions. A negative result may occur with  improper specimen collection/handling,  submission of specimen other than nasopharyngeal swab, presence of viral mutation(s) within the areas targeted by this assay, and inadequate number of viral copies (<131 copies/mL). A negative result must be combined with clinical observations, patient history, and epidemiological information. The expected result is Negative.  Fact Sheet for Patients:  https://www.moore.com/  Fact Sheet for Healthcare Providers:  https://www.young.biz/  This test is no t yet approved or cleared by the [place] FDA and  has been authorized for detection and/or diagnosis of SARS-CoV-2 by FDA under an Emergency Use Authorization (EUA). This EUA will remain  in effect (meaning this test can be used) for the duration of the COVID-19 declaration under Section 564(b)(1) of the Act, 21 U.S.C. section 360bbb-3(b)(1), unless the authorization is terminated or revoked sooner.     Influenza A by PCR NEGATIVE NEGATIVE Final   Influenza B by PCR NEGATIVE NEGATIVE Final    Comment: (NOTE) The Xpert  Xpress SARS-CoV-2/FLU/RSV assay is intended as an aid in  the diagnosis of influenza from Nasopharyngeal swab specimens and  should not be used as a sole basis for treatment. Nasal washings and  aspirates are unacceptable for Xpert Xpress SARS-CoV-2/FLU/RSV  testing.  Fact Sheet for Patients: https://www.moore.com/  Fact Sheet for Healthcare Providers: https://www.young.biz/  This test is not yet approved or cleared by the [place] FDA and  has been authorized for detection and/or diagnosis of SARS-CoV-2 by  FDA under an Emergency Use Authorization (EUA). This EUA will remain  in effect (meaning this test can be used) for the duration of the  Covid-19 declaration under Section 564(b)(1) of the Act, 21  U.S.C. section 360bbb-3(b)(1), unless the authorization is  terminated or revoked. Performed at [facility], [address] [place], [place] [address]      Discharge Instructions:   Discharge Instructions     Call MD for:  persistant nausea and vomiting   Complete by: As directed    Call MD for:  severe uncontrolled pain   Complete by: As directed    Diet - low sodium heart healthy   Complete by: As directed    Diet Carb Modified   Complete by: As directed    Discharge instructions   Complete by: As directed    Follow up with your primary care provider in one week. Continue physical therapy at home.   Increase activity slowly   Complete by: As directed       Allergies as of 05/16/2020       Reactions   Statins Other (See Comments)   Myalgias   Other    NO FOODS WITH SEEDS   Crestor [rosuvastatin] Other (See Comments)   Myalgias   Lipitor [atorvastatin] Other (See Comments)   myalgias   Zocor [simvastatin] Other (See Comments)   myalgias        Medication List     STOP taking these medications    acetaminophen 325 MG tablet Commonly known as: TYLENOL   dextrose 5 % and 0.45% NaCl  infusion       TAKE these medications    alendronate 70 MG tablet Commonly known as: FOSAMAX Take 70 mg by mouth every Monday.   ALPRAZolam 0.5 MG tablet Commonly known as: XANAX Take 0.5 mg by mouth 3 (three) times daily as needed for anxiety.   amLODipine 10 MG tablet Commonly known as: NORVASC Take 10 mg by mouth at bedtime.   aspirin EC 81 MG tablet Take 81 mg by mouth daily.  carvedilol 6.25 MG tablet Commonly known as: COREG Take 12.5 mg by mouth 2 (two) times daily.   diclofenac Sodium 1 % Gel Commonly known as: VOLTAREN Apply 4 g topically 4 (four) times daily. What changed:   when to take this  reasons to take this   divalproex 125 MG DR tablet Commonly known as: DEPAKOTE Take 125 mg by mouth at bedtime.   gabapentin 300 MG capsule Commonly known as: NEURONTIN Take 600 mg by mouth at bedtime.   hydrALAZINE 100 MG tablet Commonly known as: APRESOLINE Take 1 tablet (100 mg total) by mouth 3 (three) times daily.   IRON PO Take 1 tablet by mouth daily.   lovastatin 40 MG tablet Commonly known as: MEVACOR Take 1 tablet (40 mg total) by mouth every evening.   metFORMIN 500 MG tablet Commonly known as: GLUCOPHAGE Take 500 mg by mouth at bedtime.   ondansetron 4 MG tablet Commonly known as: ZOFRAN Take 1 tablet (4 mg total) by mouth every 6 (six) hours as needed for nausea.   sertraline 25 MG tablet Commonly known as: ZOLOFT Take 25 mg by mouth daily.   Vitamin D3 50 MCG (2000 UT) Tabs Take 1 tablet by mouth daily.           Time coordinating discharge: 39 minutes  Signed:  Annaliyah Willig  [facility] 05/16/2020, 1:31 PM

## 2020-05-16 NOTE — Progress Notes (Signed)
       Subjective: No acute changes. Tolerating full liquids. Denies nausea/vomiting. Reports she is passing flatus.  Objective: Vital signs in last 24 hours: Temp:  [97.2 F (36.2 C)-97.6 F (36.4 C)] 97.2 F (36.2 C) (10/12 0605) Pulse Rate:  [53-58] 56 (10/12 0605) Resp:  [16-18] 17 (10/12 0605) BP: (156-178)/(55-57) 165/55 (10/12 0605) SpO2:  [96 %-98 %] 97 % (10/12 0605) Last BM Date: 05/15/20  Intake/Output from previous day: 10/11 0701 - 10/12 0700 In: 490 [P.O.:490] Out: 0  Intake/Output this shift: No intake/output data recorded.  PE: General: resting comfortably, NAD Neuro: no focal deficits Resp: normal work of breathing Abdomen: soft, nondistended, nontender to palpation. Well-healed midline surgical scar. Extremities: warm and well-perfused  Lab Results:  Recent Labs    05/15/20 0335 05/16/20 0325  WBC 9.5 8.5  HGB 8.9* 9.2*  HCT 29.5* 30.1*  PLT 149* 162   BMET Recent Labs    05/15/20 0335 05/16/20 0325  NA 147* 143  K 3.2* 3.6  CL 108 106  CO2 28 27  GLUCOSE 115* 121*  BUN 30* 24*  CREATININE 1.17* 1.15*  CALCIUM 8.9 8.8*   PT/INR No results for input(s): LABPROT, INR in the last 72 hours. CMP     Component Value Date/Time   NA 143 05/16/2020 0325   K 3.6 05/16/2020 0325   CL 106 05/16/2020 0325   CO2 27 05/16/2020 0325   GLUCOSE 121 (H) 05/16/2020 0325   BUN 24 (H) 05/16/2020 0325   CREATININE 1.15 (H) 05/16/2020 0325   CALCIUM 8.8 (L) 05/16/2020 0325   PROT 7.0 05/14/2020 0317   ALBUMIN 3.6 05/14/2020 0317   AST 16 05/14/2020 0317   ALT 9 05/14/2020 0317   ALKPHOS 63 05/14/2020 0317   BILITOT 1.1 05/14/2020 0317   GFRNONAA 41 (L) 05/16/2020 0325   GFRAA 33 (L) 10/11/2019 0321   Lipase     Component Value Date/Time   LIPASE 32 05/13/2020 0824       Studies/Results: DG Abd Portable 1V-Small Bowel Obstruction Protocol-initial, 8 hr delay  Result Date: 05/14/2020 CLINICAL DATA:  Small-bowel obstruction. EXAM:  PORTABLE ABDOMEN - 1 VIEW COMPARISON:  None. FINDINGS: Enteric contrast is now seen within the distal colon and rectum. Note is made of numerous diverticula within the distal colon. Nasogastric tube tip overlies the expected distal body of the stomach. The abdominal gas pattern is normal. No gross free intraperitoneal gas. IMPRESSION: Resolved small-bowel obstruction. Enteric contrast now seen within the distal colon. Electronically Signed   By: Helyn Numbers MD   On: 05/14/2020 21:00    Anti-infectives: Anti-infectives (From admission, onward)   None       Assessment/Plan 86 yo female with a history of multiple prior abdominal surgeries, presenting with an adhesive small bowel obstruction, now resolved. - Advance to soft diet - Recommend low fiber diet for 1 month - No indication for acute surgical intervention at this time - General surgery will sign off at this time. Please call with any further questions or concerns.   LOS: 3 days    Sophronia Simas, MD [facility] Surgery General, Hepatobiliary and Pancreatic Surgery 05/16/20 8:49 AM

## 2020-05-16 NOTE — TOC Initial Note (Addendum)
Transition of Care [facility]) - Initial/Assessment Note    Patient Details  Name: Sonya Chavez MRN: [id number] Date of Birth: 1926/05/04  Transition of Care [facility]) CM/SW Contact:    Lia Hopping, [place] Phone Number: 05/16/2020, 3:22 PM  Clinical Narrative:                 CSW met with the patient and her spouse at discharge to discuss home heath physical therapy needs. Patient tearful upon arrival because she thought the hospital was sending her to a SNF. Patient spouse reasurred her she is going home.  Patient spouse report the patient is independent with bathing, dressing and preparing meals. He reports she walks independently however she has a RW, WC and Cane. Spouse reports the patient had home health in the past but cannot recall the name of the agency. Spouse reports their adult children who live out of town help coordinate home need.  Spouse reached out to his daughter Vaughan Basta. She reports the patient is active with [facility] and has used [facility] in the past. CSW reached out to [facility] and confirmed staff availbitiy. Spouse notified and report understanding.   No other needs identified.    Expected Discharge Plan: [place] Barriers to Discharge: Barriers Resolved   Patient Goals and CMS Choice Patient states their goals for this hospitalization and ongoing recovery are:: " I want to go home." CMS Medicare.gov Compare Post Acute Care list provided to:: Patient Represenative (must comment) (Adult Child-Linda) Choice offered to / list presented to : Spouse, Adult Children  Expected Discharge Plan and Services Expected Discharge Plan: [place] In-house Referral: Clinical Social Work   Post Acute Care Choice: [place] arrangements for the past 2 months: [place] Expected Discharge Date: 05/16/20                         HH Arranged: PT [place]: [facility] Date [place]: 05/16/20 Time HH Agency Contacted: 0130 Representative spoke with at [place]: [place] Arrangements/Services Living arrangements for the past 2 months: [place] with:: Spouse Patient language and need for interpreter reviewed:: No        Need for Family Participation in Patient Care: Yes (Comment) Care giver support system in place?: Yes (comment) Current home services: DME Criminal Activity/Legal Involvement Pertinent to Current Situation/Hospitalization: No - Comment as needed  Activities of Daily Living Home Assistive Devices/Equipment: Cane (specify quad or straight) ADL Screening (condition at time of admission) Patient's cognitive ability adequate to safely complete daily activities?: Yes Is the patient deaf or have difficulty hearing?: No Does the patient have difficulty seeing, even when wearing glasses/contacts?: No Does the patient have difficulty concentrating, remembering, or making decisions?: No Patient able to express need for assistance with ADLs?: Yes Does the patient have difficulty dressing or bathing?: No Independently performs ADLs?: Yes (appropriate for developmental age) Does the patient have difficulty walking or climbing stairs?: No Weakness of Legs: Both Weakness of Arms/Hands: None  Permission Sought/Granted   Permission granted to share information with : Yes, Verbal Permission Granted        Permission granted to share info w Relationship: Spouse     Emotional Assessment Appearance:: Appears stated age Attitude/Demeanor/Rapport: Crying Affect (typically observed): Tearful/Crying Orientation: : Oriented to Self, Oriented to Place Alcohol / Substance Use: Not Applicable Psych Involvement: No (comment)  Admission diagnosis:  Small bowel obstruction ([place]) [K56.609] SBO (small bowel obstruction) ([place]) [K56.609] Patient Active Problem List   Diagnosis Date Noted  . S/P right hip fracture 05/14/2020  . DNR  (do not resuscitate) 10/07/2019  . Alzheimer's dementia with behavioral disturbance ([place])   . Palliative care encounter   . Failure to thrive (0-17) 10/06/2019  . Weakness generalized 10/05/2019  . Hypernatremia 07/18/2019  . Hypokalemia 07/18/2019  . Elevated troponin 07/17/2019  . Acute lower UTI 07/17/2019  . Positive D dimer 07/17/2019  . Normocytic anemia 07/17/2019  . Cholelithiasis 01/02/2019  . CKD (chronic kidney disease), stage III ([place]) 10/21/2017  . Delirium due to another medical condition 12/18/2016  . SBO (small bowel obstruction) ([place]) 12/18/2016  . 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 Leukocytosis 12/18/2016  . Peripheral artery disease ([place]) 11/08/2014  . PAD (peripheral artery disease) ([place]) 10/24/2014  . LBBB (left bundle branch block) 09/27/2014    Class: Chronic  . Subclavian artery stenosis, left ([place]) 09/27/2014  . Numbness and tingling in left hand 03/08/2013  . HTN (hypertension) 03/08/2013  . DM (diabetes mellitus) ([place]) 03/08/2013  . CAD (coronary artery disease) 03/08/2013   PCP:  Velna Hatchet, MD Pharmacy:   [id number]: [phone]: [phone]    Social Determinants of Health (SDOH) Interventions    Readmission Risk Interventions No flowsheet data found.

## 2020-05-17 DIAGNOSIS — S72144D Nondisplaced intertrochanteric fracture of right femur, subsequent encounter for closed fracture with routine healing: Secondary | ICD-10-CM | POA: Diagnosis not present

## 2020-05-17 DIAGNOSIS — I739 Peripheral vascular disease, unspecified: Secondary | ICD-10-CM | POA: Diagnosis not present

## 2020-05-17 DIAGNOSIS — I771 Stricture of artery: Secondary | ICD-10-CM | POA: Diagnosis not present

## 2020-05-17 DIAGNOSIS — Z7982 Long term (current) use of aspirin: Secondary | ICD-10-CM | POA: Diagnosis not present

## 2020-05-17 DIAGNOSIS — E1122 Type 2 diabetes mellitus with diabetic chronic kidney disease: Secondary | ICD-10-CM | POA: Diagnosis not present

## 2020-05-17 DIAGNOSIS — K56609 Unspecified intestinal obstruction, unspecified as to partial versus complete obstruction: Secondary | ICD-10-CM | POA: Diagnosis not present

## 2020-05-17 DIAGNOSIS — N1832 Chronic kidney disease, stage 3b: Secondary | ICD-10-CM | POA: Diagnosis not present

## 2020-05-17 DIAGNOSIS — I129 Hypertensive chronic kidney disease with stage 1 through stage 4 chronic kidney disease, or unspecified chronic kidney disease: Secondary | ICD-10-CM | POA: Diagnosis not present

## 2020-05-17 DIAGNOSIS — I10 Essential (primary) hypertension: Secondary | ICD-10-CM | POA: Diagnosis not present

## 2020-05-17 DIAGNOSIS — E1151 Type 2 diabetes mellitus with diabetic peripheral angiopathy without gangrene: Secondary | ICD-10-CM | POA: Diagnosis not present

## 2020-05-17 DIAGNOSIS — Z7984 Long term (current) use of oral hypoglycemic drugs: Secondary | ICD-10-CM | POA: Diagnosis not present

## 2020-05-17 DIAGNOSIS — G309 Alzheimer's disease, unspecified: Secondary | ICD-10-CM | POA: Diagnosis not present

## 2020-05-17 DIAGNOSIS — I2510 Atherosclerotic heart disease of native coronary artery without angina pectoris: Secondary | ICD-10-CM | POA: Diagnosis not present

## 2020-05-17 DIAGNOSIS — F0280 Dementia in other diseases classified elsewhere without behavioral disturbance: Secondary | ICD-10-CM | POA: Diagnosis not present

## 2020-05-17 DIAGNOSIS — D649 Anemia, unspecified: Secondary | ICD-10-CM | POA: Diagnosis not present

## 2020-05-18 ENCOUNTER — Other Ambulatory Visit: Payer: Self-pay | Admitting: *Deleted

## 2020-05-18 NOTE — Patient Outreach (Signed)
[facility]) Care Management  05/18/2020  [place] M Delehanty 05-Sep-1925 [id number]   Noted that member was admitted to hospital 10/9-10/12 for small bowel obstruction.  Member also noted to have an old hip fracture during hospitalization (fall several months ago while at [facility]).  Call placed to member's home and husband's cell number, no answer, HIPAA compliant voice message left.  Call then placed to daughter in law [place].  She report she has been in contact with member, no concerns noted at this time.  She has restarted with [facility] for home health.  [name] unaware of any recurrent nausea/vomiting since being back home.  She will follow up with member, will also verify that member has follow up appointment with PCP.  Denies any urgent concerns, will follow up within the next month.  Goals Addressed              This Visit's Progress   .  COMPLETED: Per husband: Remain strong and independent (pt-stated)        CARE PLAN ENTRY (see longitudinal plan of care for additional care plan information)  Current Barriers:  . Cognitive Deficits  Nurse Case Manager Clinical Goal(s):  Marland Kitchen Over the next 31 days, patient will verbalize understanding of plan for long term management of dementia . Over the next 28 days, patient will meet with RN Care Manager to address increased strength and independence . Over the next 28 days, patient will demonstrate improved health management independence as evidenced byperforming ADL's independently  Interventions:  . Inter-disciplinary care team collaboration (see longitudinal plan of care) . Advised patient to contact this care manager with problems . Discussed plans with patient for ongoing care management follow up and provided patient with direct contact information for care management team . Reviewed scheduled/upcoming provider appointments including:   Patient Self Care Activities:  . Attends all scheduled provider  appointments . Performs ADL's independently . Calls provider office for new concerns or questions . Does not contact provider office for questions/concerns    Resolving due to duplicate goal     .  [place] - Enhance My Mental Skills        Follow Up Date 12/1   - check out volunteer opportunities - stay in touch with my family and friends - take a walk daily and think about what I am seeing    Why is this important?   As we age, or sometimes because we have an illness, it feels like our memory and ability to figure things out is not very good.  There are things you can do to keep your memory and your thinking as strong as possible.    Notes:     .  [facility] - Make and Keep All Appointments        Follow Up Date 11/14   - call to cancel if needed - keep a calendar with appointment dates    Why is this important?   Part of staying healthy is seeing the doctor for follow-up care.  If you forget your appointments, there are some things you can do to stay on track.    Notes:  Need PCP        Kemper Durie, RN, MSN [facility] Care Management  [facility] Manager [phone]

## 2020-05-23 DIAGNOSIS — I509 Heart failure, unspecified: Secondary | ICD-10-CM | POA: Diagnosis not present

## 2020-05-23 DIAGNOSIS — K56609 Unspecified intestinal obstruction, unspecified as to partial versus complete obstruction: Secondary | ICD-10-CM | POA: Diagnosis not present

## 2020-05-23 DIAGNOSIS — E876 Hypokalemia: Secondary | ICD-10-CM | POA: Diagnosis not present

## 2020-05-23 DIAGNOSIS — R112 Nausea with vomiting, unspecified: Secondary | ICD-10-CM | POA: Diagnosis not present

## 2020-05-23 DIAGNOSIS — E785 Hyperlipidemia, unspecified: Secondary | ICD-10-CM | POA: Diagnosis not present

## 2020-05-23 DIAGNOSIS — N1832 Chronic kidney disease, stage 3b: Secondary | ICD-10-CM | POA: Diagnosis not present

## 2020-05-23 DIAGNOSIS — G309 Alzheimer's disease, unspecified: Secondary | ICD-10-CM | POA: Diagnosis not present

## 2020-05-23 DIAGNOSIS — S72001A Fracture of unspecified part of neck of right femur, initial encounter for closed fracture: Secondary | ICD-10-CM | POA: Diagnosis not present

## 2020-05-23 DIAGNOSIS — N6320 Unspecified lump in the left breast, unspecified quadrant: Secondary | ICD-10-CM | POA: Diagnosis not present

## 2020-05-23 DIAGNOSIS — Z23 Encounter for immunization: Secondary | ICD-10-CM | POA: Diagnosis not present

## 2020-05-23 DIAGNOSIS — I130 Hypertensive heart and chronic kidney disease with heart failure and stage 1 through stage 4 chronic kidney disease, or unspecified chronic kidney disease: Secondary | ICD-10-CM | POA: Diagnosis not present

## 2020-05-24 DIAGNOSIS — E1122 Type 2 diabetes mellitus with diabetic chronic kidney disease: Secondary | ICD-10-CM | POA: Diagnosis not present

## 2020-05-24 DIAGNOSIS — S72144D Nondisplaced intertrochanteric fracture of right femur, subsequent encounter for closed fracture with routine healing: Secondary | ICD-10-CM | POA: Diagnosis not present

## 2020-05-24 DIAGNOSIS — K56609 Unspecified intestinal obstruction, unspecified as to partial versus complete obstruction: Secondary | ICD-10-CM | POA: Diagnosis not present

## 2020-05-24 DIAGNOSIS — I129 Hypertensive chronic kidney disease with stage 1 through stage 4 chronic kidney disease, or unspecified chronic kidney disease: Secondary | ICD-10-CM | POA: Diagnosis not present

## 2020-05-24 DIAGNOSIS — N1832 Chronic kidney disease, stage 3b: Secondary | ICD-10-CM | POA: Diagnosis not present

## 2020-05-24 DIAGNOSIS — E1151 Type 2 diabetes mellitus with diabetic peripheral angiopathy without gangrene: Secondary | ICD-10-CM | POA: Diagnosis not present

## 2020-05-25 DIAGNOSIS — I129 Hypertensive chronic kidney disease with stage 1 through stage 4 chronic kidney disease, or unspecified chronic kidney disease: Secondary | ICD-10-CM | POA: Diagnosis not present

## 2020-05-25 DIAGNOSIS — K56609 Unspecified intestinal obstruction, unspecified as to partial versus complete obstruction: Secondary | ICD-10-CM | POA: Diagnosis not present

## 2020-05-25 DIAGNOSIS — E1151 Type 2 diabetes mellitus with diabetic peripheral angiopathy without gangrene: Secondary | ICD-10-CM | POA: Diagnosis not present

## 2020-05-25 DIAGNOSIS — N1832 Chronic kidney disease, stage 3b: Secondary | ICD-10-CM | POA: Diagnosis not present

## 2020-05-25 DIAGNOSIS — S72144D Nondisplaced intertrochanteric fracture of right femur, subsequent encounter for closed fracture with routine healing: Secondary | ICD-10-CM | POA: Diagnosis not present

## 2020-05-25 DIAGNOSIS — E1122 Type 2 diabetes mellitus with diabetic chronic kidney disease: Secondary | ICD-10-CM | POA: Diagnosis not present

## 2020-05-30 ENCOUNTER — Telehealth: Payer: Self-pay

## 2020-05-30 DIAGNOSIS — I129 Hypertensive chronic kidney disease with stage 1 through stage 4 chronic kidney disease, or unspecified chronic kidney disease: Secondary | ICD-10-CM | POA: Diagnosis not present

## 2020-05-30 DIAGNOSIS — S72144D Nondisplaced intertrochanteric fracture of right femur, subsequent encounter for closed fracture with routine healing: Secondary | ICD-10-CM | POA: Diagnosis not present

## 2020-05-30 DIAGNOSIS — K56609 Unspecified intestinal obstruction, unspecified as to partial versus complete obstruction: Secondary | ICD-10-CM | POA: Diagnosis not present

## 2020-05-30 DIAGNOSIS — E1151 Type 2 diabetes mellitus with diabetic peripheral angiopathy without gangrene: Secondary | ICD-10-CM | POA: Diagnosis not present

## 2020-05-30 DIAGNOSIS — E1122 Type 2 diabetes mellitus with diabetic chronic kidney disease: Secondary | ICD-10-CM | POA: Diagnosis not present

## 2020-05-30 DIAGNOSIS — N1832 Chronic kidney disease, stage 3b: Secondary | ICD-10-CM | POA: Diagnosis not present

## 2020-05-30 NOTE — Telephone Encounter (Signed)
11:00AM: Palliative care SW outreached patient for telephonic visit.   Patients husband answered sharing that patient had ED visit on 05/13/2020 due to bowel obstruction. Husband shares that patient has been fine since ED visit. No falls reported. Patient not eating as much, per husband patient is not a big eater. Patient has [facility]. No psychosocial concerns at this time. Palliative care will continue to monitor and assist with long term care planning as needed.   SW attempted to schedule in home visit for Nov. Husband requested another call to schedule this visit.

## 2020-06-01 DIAGNOSIS — E1151 Type 2 diabetes mellitus with diabetic peripheral angiopathy without gangrene: Secondary | ICD-10-CM | POA: Diagnosis not present

## 2020-06-01 DIAGNOSIS — E1122 Type 2 diabetes mellitus with diabetic chronic kidney disease: Secondary | ICD-10-CM | POA: Diagnosis not present

## 2020-06-01 DIAGNOSIS — N1832 Chronic kidney disease, stage 3b: Secondary | ICD-10-CM | POA: Diagnosis not present

## 2020-06-01 DIAGNOSIS — K56609 Unspecified intestinal obstruction, unspecified as to partial versus complete obstruction: Secondary | ICD-10-CM | POA: Diagnosis not present

## 2020-06-01 DIAGNOSIS — S72144D Nondisplaced intertrochanteric fracture of right femur, subsequent encounter for closed fracture with routine healing: Secondary | ICD-10-CM | POA: Diagnosis not present

## 2020-06-01 DIAGNOSIS — I129 Hypertensive chronic kidney disease with stage 1 through stage 4 chronic kidney disease, or unspecified chronic kidney disease: Secondary | ICD-10-CM | POA: Diagnosis not present

## 2020-06-02 IMAGING — CT CT HEAD W/O CM
3 series · 15 of 47 positions shown, 18 images · non-contrast
Comparison: Head CT 12/03/2018

CLINICAL DATA: Encephalopathy

EXAM:
CT HEAD WITHOUT CONTRAST
TECHNIQUE: Contiguous axial images were obtained from the base of the skull
through the vertex without intravenous contrast.

[Series 3: head 5.0 h30s · axial · 0.45mm/px · z∈[-204,-69]mm · 9 of 33 slices shown, 12 images]
[im 3/33  brain]
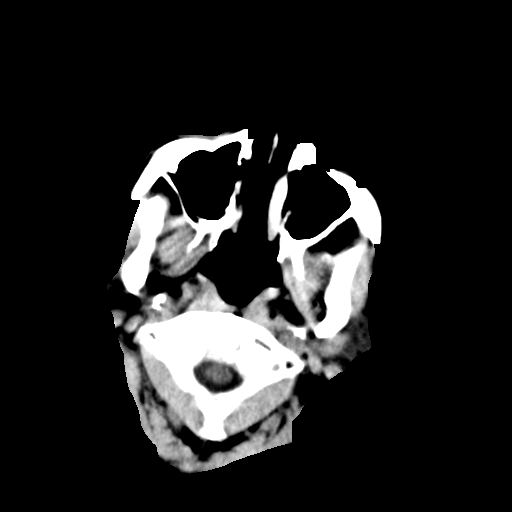
[im 3/33  bone]
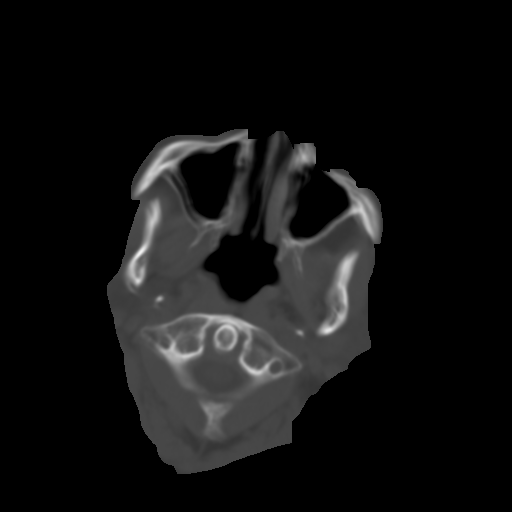
[im 6/33  brain]
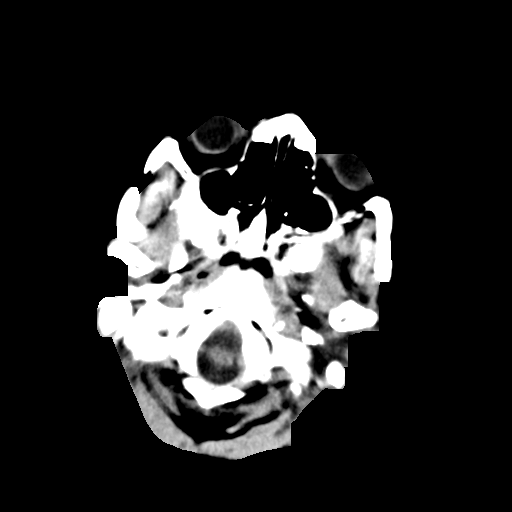
[im 9/33  brain]
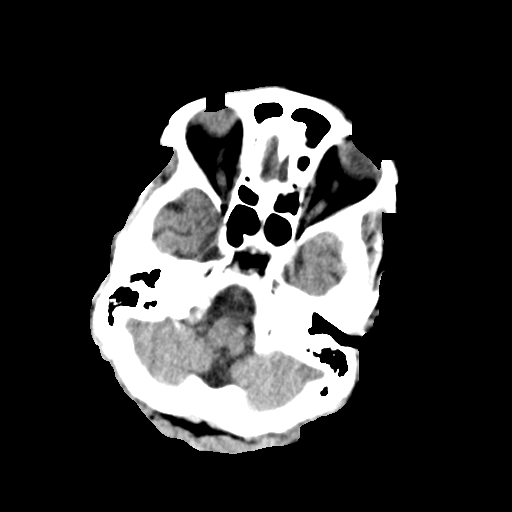
[im 13/33  brain]
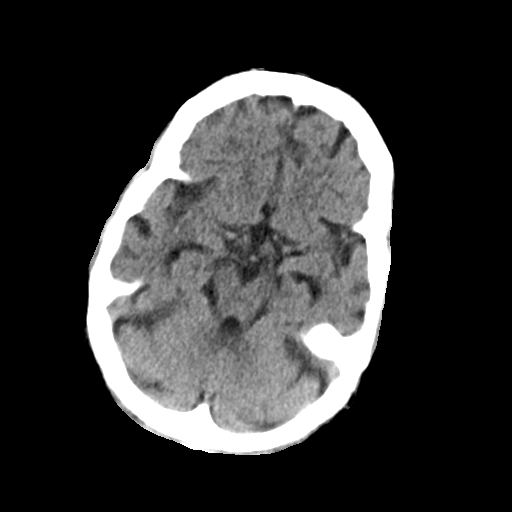
[im 17/33  brain]
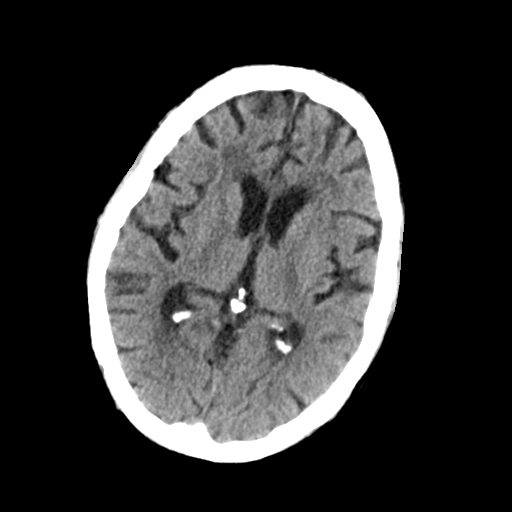
[im 17/33  bone]
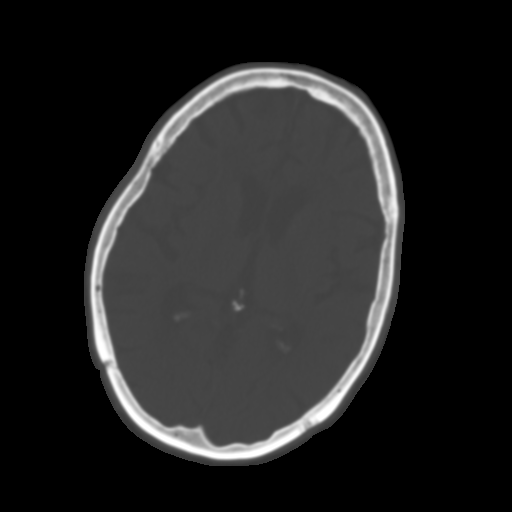
[im 20/33  brain]
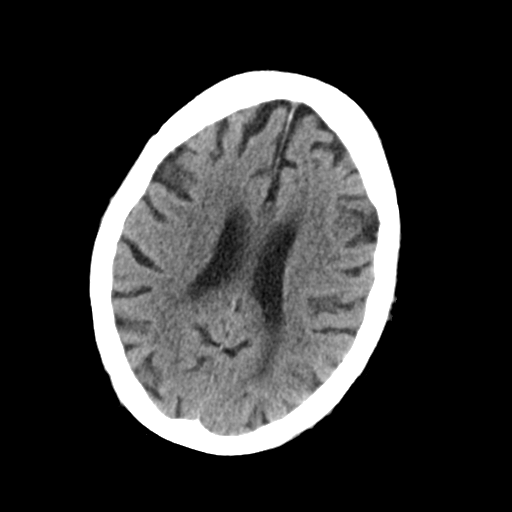
[im 24/33  brain]
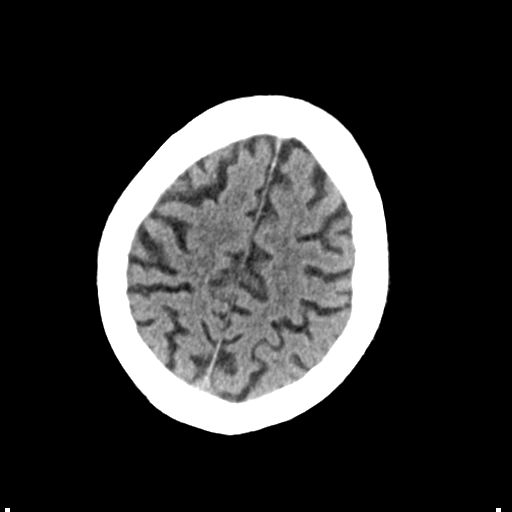
[im 27/33  brain]
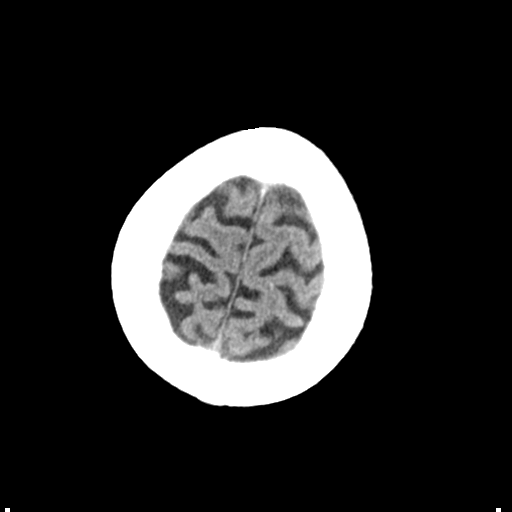
[im 30/33  brain]
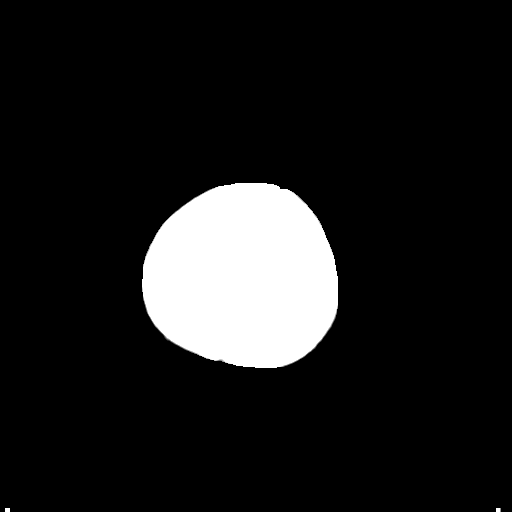
[im 30/33  bone]
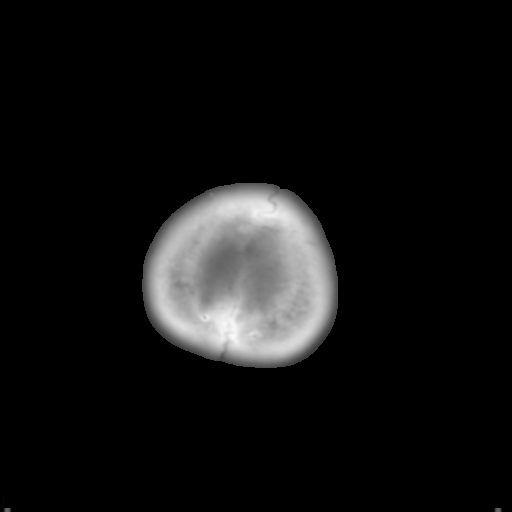

[Series 5: head 3.0 mpr cor · coronal · 0.31mm/px · 3 of 69 slices shown]
[im 23/69  brain]
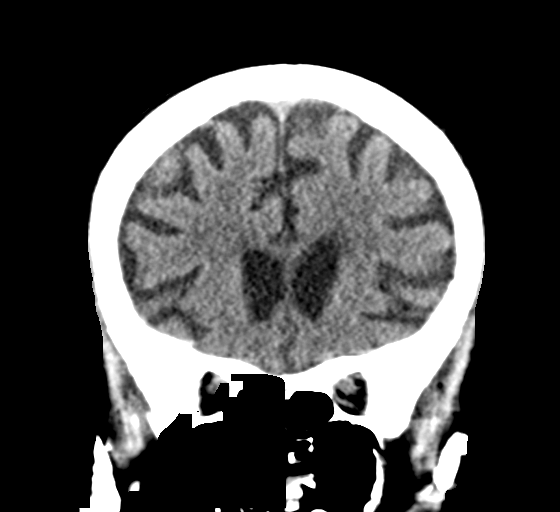
[im 31/69  brain]
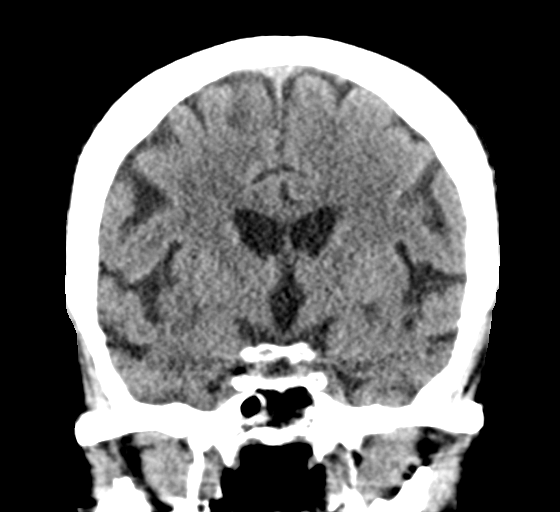
[im 38/69  brain]
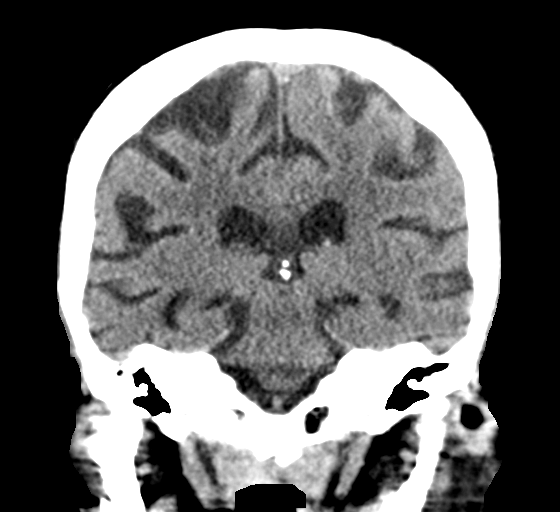

[Series 6: head 3.0 mpr sag · sagittal · 0.31mm/px · 3 of 66 slices shown]
[im 22/66  brain]
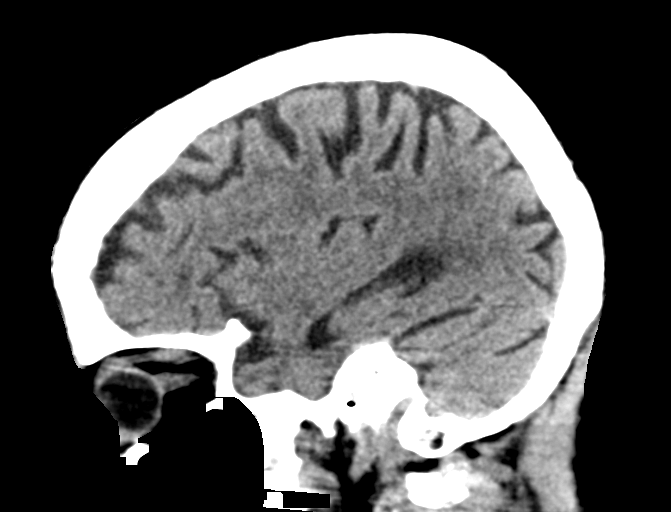
[im 33/66  brain]
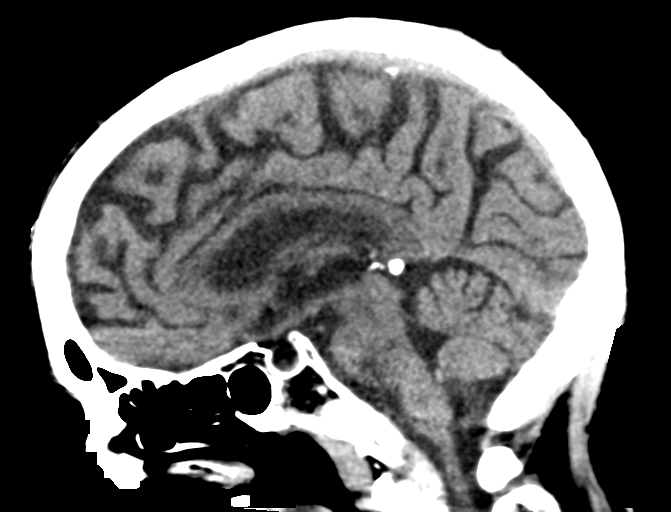
[im 44/66  brain]
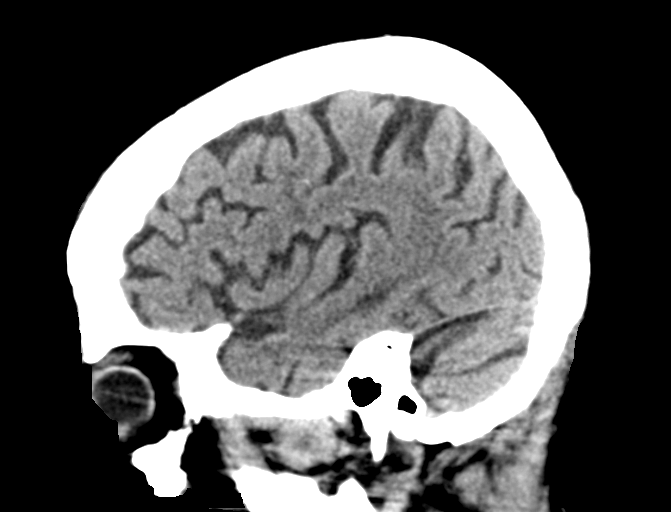

[15 of 47 positions shown; findings below may reference images not displayed]

FINDINGS: Brain: There is no mass, hemorrhage or extra-axial collection. The
size and configuration of the ventricles and extra-axial CSF spaces
are normal. There is hypoattenuation of the white matter, most
commonly indicating chronic small vessel disease.

Vascular: Atherosclerotic calcification of the internal carotid
arteries at the skull base. No abnormal hyperdensity of the major
intracranial arteries or dural venous sinuses.

Skull: The visualized skull base, calvarium and extracranial soft
tissues are normal.

Sinuses/Orbits: No fluid levels or advanced mucosal thickening of
the visualized paranasal sinuses. No mastoid or middle ear effusion.
The orbits are normal.
IMPRESSION: Findings of chronic small vessel disease without acute intracranial
abnormality.

## 2020-06-02 IMAGING — DX DG ABD PORTABLE 1V
1 series · 1 of 1 positions shown · non-contrast
Comparison: December 31, 2018.

CLINICAL DATA: Nasogastric tube placement.

EXAM:
PORTABLE ABDOMEN - 1 VIEW

[abdomen kub]
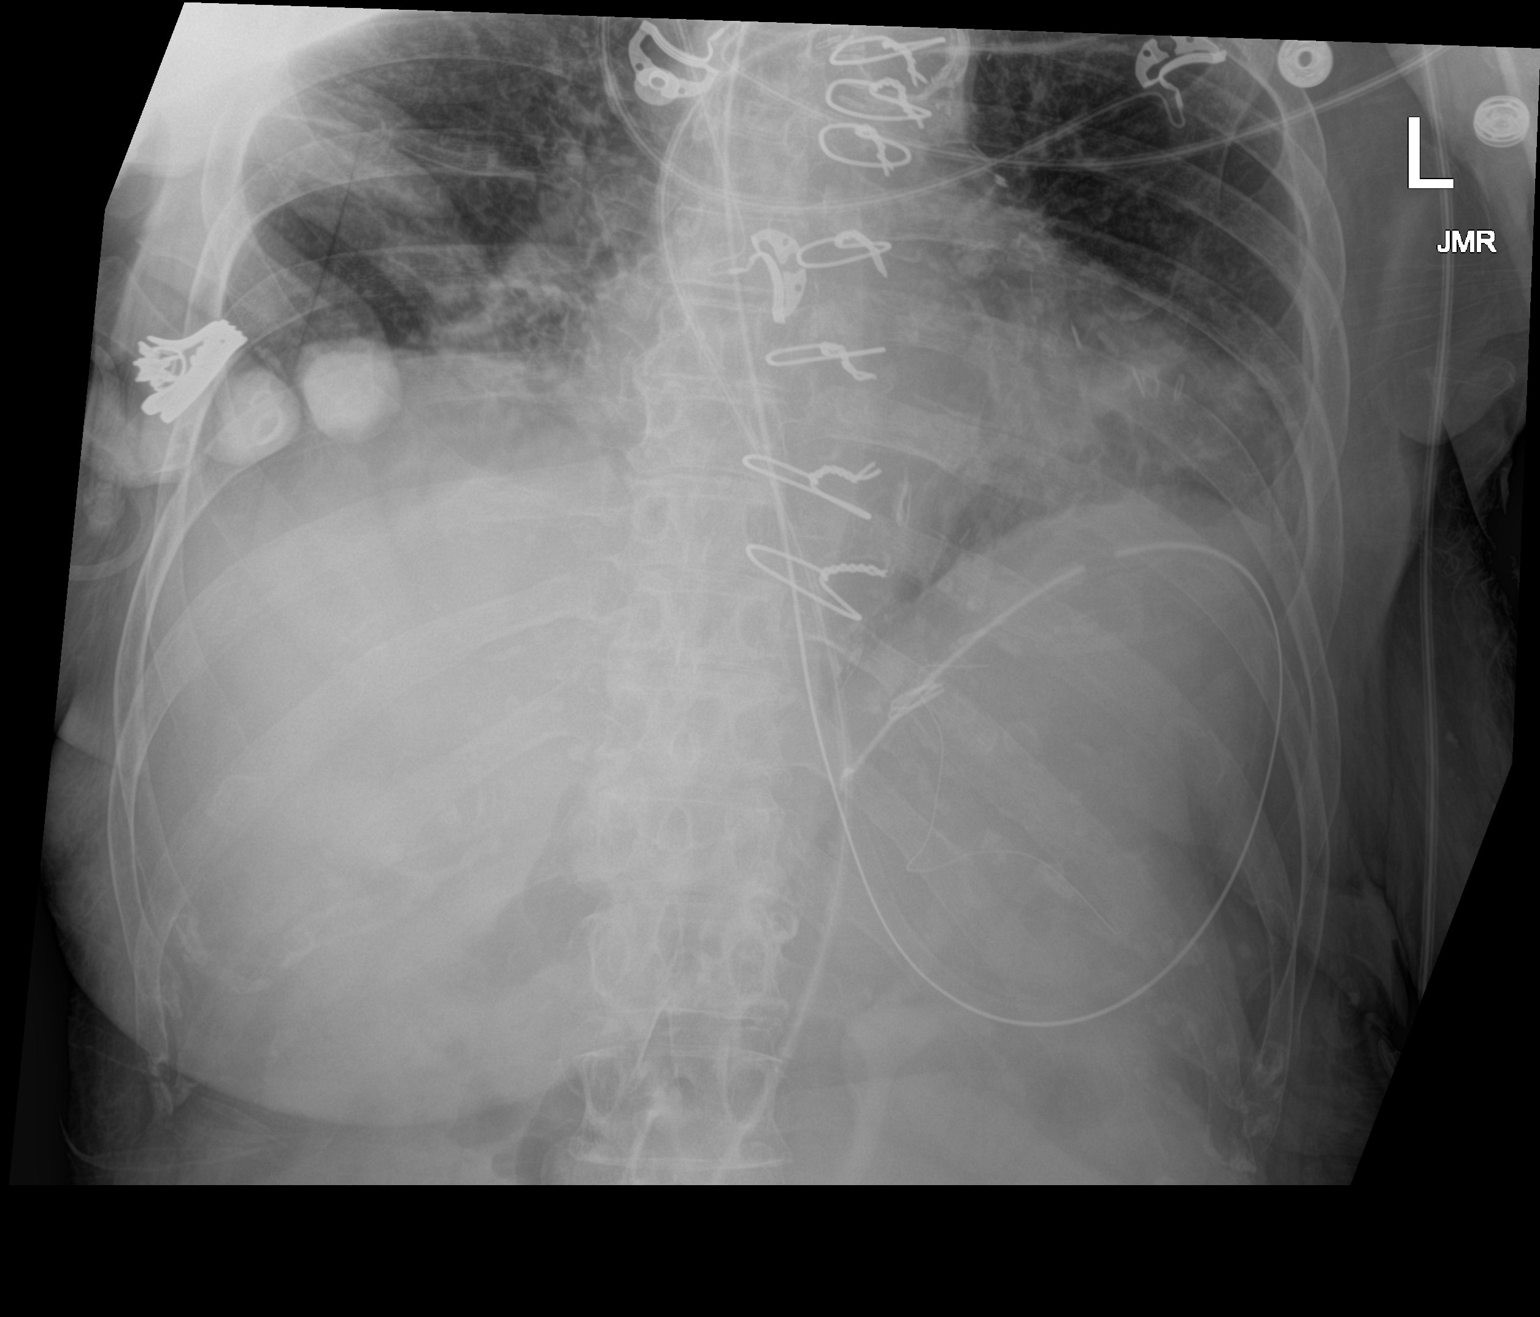

[1 of 1 positions shown; findings below may reference images not displayed]

FINDINGS: The bowel gas pattern is normal. Nasogastric tube tip is seen in
proximal stomach. No radio-opaque calculi or other significant
radiographic abnormality are seen.
IMPRESSION: Nasogastric tube tip seen in proximal stomach. No evidence of bowel
obstruction or ileus.

## 2020-06-05 ENCOUNTER — Telehealth: Payer: Self-pay

## 2020-06-05 NOTE — Telephone Encounter (Signed)
11:20AM: Palliative care SW outreached patients patients husband to schedule in home visit.   Palliative care SW and RN to visit patient 11/2 @11am .

## 2020-06-06 ENCOUNTER — Other Ambulatory Visit: Payer: Medicare Other

## 2020-06-06 DIAGNOSIS — N1832 Chronic kidney disease, stage 3b: Secondary | ICD-10-CM | POA: Diagnosis not present

## 2020-06-06 DIAGNOSIS — E1122 Type 2 diabetes mellitus with diabetic chronic kidney disease: Secondary | ICD-10-CM | POA: Diagnosis not present

## 2020-06-06 DIAGNOSIS — I129 Hypertensive chronic kidney disease with stage 1 through stage 4 chronic kidney disease, or unspecified chronic kidney disease: Secondary | ICD-10-CM | POA: Diagnosis not present

## 2020-06-06 DIAGNOSIS — S72144D Nondisplaced intertrochanteric fracture of right femur, subsequent encounter for closed fracture with routine healing: Secondary | ICD-10-CM | POA: Diagnosis not present

## 2020-06-06 DIAGNOSIS — E1151 Type 2 diabetes mellitus with diabetic peripheral angiopathy without gangrene: Secondary | ICD-10-CM | POA: Diagnosis not present

## 2020-06-06 DIAGNOSIS — K56609 Unspecified intestinal obstruction, unspecified as to partial versus complete obstruction: Secondary | ICD-10-CM | POA: Diagnosis not present

## 2020-06-07 ENCOUNTER — Other Ambulatory Visit: Payer: Medicare Other

## 2020-06-07 ENCOUNTER — Other Ambulatory Visit: Payer: Self-pay

## 2020-06-07 VITALS — BP 148/58 | HR 87 | Resp 18 | Wt 100.8 lb

## 2020-06-07 DIAGNOSIS — Z515 Encounter for palliative care: Secondary | ICD-10-CM

## 2020-06-07 NOTE — Progress Notes (Signed)
PATIENT NAME: [place] M Berwick DOB: Mar 13, 1926 MRN: [id number]  PRIMARY CARE PROVIDER: Velna Hatchet, MD  RESPONSIBLE PARTY:  Acct ID - Guarantor Home Phone Work Phone Relationship Acct Type  [id number] Allona Gondek,* [phone]  Self P/F     [address], [place], [place] [address]    PLAN OF CARE and INTERVENTIONS:               1.  GOALS OF CARE/ ADVANCE CARE PLANNING:  Patient desires to remain in the home and independent.               2.  PATIENT/CAREGIVER EDUCATION:  Constipation and safety.               3.  DISEASE STATUS: Greeted by patient's spouse at the back door.  Patient is found walking into the dinning room.  She has just woken up.  Patient denies any pain or changes since last Palliative Care contact.  Currently working with PT a couple of times a week for strengthening.  Patient notes she feels stronger.  No falls are reported.  Patient does use a cane occasionally and has a wheelchair in the car if needed.  Patient reports an improvement in her appetite.  She is eating 2-3 meals on average and snacks occasionally between meals.  She has Boost but only drinks when her appetite is not good. Current weight is 100.8 lbs.  Patient with personal care independently.  She notes her and her husband help each other with household chores and are typically always together.  They are completing grocery shopping together and attend church almost weekly. Patient has a sister that lives locally that checks in daily by phone and will cook meals on the weekends for them.  Patient denies any shortness of breath, dizziness, nausea or vomiting.  Bowel movements are regular and she denies any significant issues with constipation.  We have discussed remedies for constipation such as increasing fiber intake and ensure she is drinking liquids well.  Patient verbalized understanding.  Medications reviewed and no changes are noted.   HISTORY OF PRESENT ILLNESS:  86 year old female with  HTN, DM and Alzheimer's dementia.  Patient is being followed by Palliative Care monthly and prn.  CODE STATUS: Full ADVANCED DIRECTIVES: No MOST FORM: No PPS: 50%   PHYSICAL EXAM:   VITALS:  BP 148/58 P 87 R 18 O2 Sats 98% on RA. LUNGS: CTA CARDIAC: HRR EXTREMITIES: No edema present. SKIN: Warm and dry to touch.  No skin breakdown noted by patient. NEURO: alert and oriented.  Forgetfulness noted.  Time 1106 am- 1156am      Lorenza Burton, RN

## 2020-06-07 NOTE — Progress Notes (Signed)
COMMUNITY PALLIATIVE CARE SW NOTE  PATIENT NAME: Sonya Chavez DOB: 02/08/1926 MRN: [id number]  PRIMARY CARE PROVIDER: Holwerda, Scott, MD  RESPONSIBLE PARTY:  Acct ID - Guarantor Home Phone Work Phone Relationship Acct Type  [id number] - [place],* [phone]  Self P/F     [address], , [place] [address]     PLAN OF CARE and INTERVENTIONS:             1. GOALS OF CARE/ ADVANCE CARE PLANNING: Patient is Full Code. MOST form discussed. HCPOA is Joe (patient's son) and Ronald. Goal is for patient to remain in the home as long as she is able. 2. SOCIAL/EMOTIONAL/SPIRITUAL ASSESSMENT/ INTERVENTIONS: SW and RN met with patient and husband in the home for scheduled visit. Patient and husband updated team of patients medical status and changes. Patient had a recent hospitalization due to bowel obstruction. Patient is n ow receiving HH PT with [facility]. Patient and husband shared that she has increased her eating, although she does not eat breakfast often. Pt continues to drink supplements. Patient weighs self daily and husband writes all weights down, patient has had a weight loss since last visit. Pt shared that she usually sleeps well. Husband shared that patient had a f/u appt with PCP recently and no medication changes were made. Patient reported no pain. Patient and husband [place] just to get out of the house. No other concerns at this time. Palliative care will continue to follow. 3. PATIENT/CAREGIVER EDUCATION/ COPING:  Patient is alert, engaged during visit. Patient was able to answer questions appropriately but is confused at times, looks to husband to assist with remembering some things. Patient is oriented to self and place, and at times situation. Patient is coping well overall and denies concerns. Patient shares that she enjoys cooking and washing dishes and does this at least 2-3x/week. Supportive family but limited local support, patients  church family has started making visits again and a sister who lives nearby that offers to cook Sunday dinner every weekend. 4. PERSONAL EMERGENCY PLAN:  Patient to call 911 for emergencies. 5. COMMUNITY RESOURCES COORDINATION/ HEALTH CARE NAVIGATION:  Husband manages patients care mainly, however patients son and DIL assist as much as possible from  A distance. Husband manages medications and has weekly pill box set up. [facility] in place for therapy. 6. FINANCIAL/LEGAL CONCERNS/INTERVENTIONS:  None.        SOCIAL HX:  Social History   Tobacco Use  . Smoking status: Never Smoker  . Smokeless tobacco: Never Used  Substance Use Topics  . Alcohol use: No    CODE STATUS: Full code ADVANCED DIRECTIVES: Y MOST FORM COMPLETE: N HOSPICE EDUCATION PROVIDED: N  PPS: Patient is ambulatory w/o AD, but has SPC if needed. Patient is I with ADL's but needs MIN-A with sit stand.    Time spent: 47 min.       , LCSW 

## 2020-06-08 DIAGNOSIS — I129 Hypertensive chronic kidney disease with stage 1 through stage 4 chronic kidney disease, or unspecified chronic kidney disease: Secondary | ICD-10-CM | POA: Diagnosis not present

## 2020-06-08 DIAGNOSIS — E1122 Type 2 diabetes mellitus with diabetic chronic kidney disease: Secondary | ICD-10-CM | POA: Diagnosis not present

## 2020-06-08 DIAGNOSIS — N1832 Chronic kidney disease, stage 3b: Secondary | ICD-10-CM | POA: Diagnosis not present

## 2020-06-08 DIAGNOSIS — S72144D Nondisplaced intertrochanteric fracture of right femur, subsequent encounter for closed fracture with routine healing: Secondary | ICD-10-CM | POA: Diagnosis not present

## 2020-06-08 DIAGNOSIS — E1151 Type 2 diabetes mellitus with diabetic peripheral angiopathy without gangrene: Secondary | ICD-10-CM | POA: Diagnosis not present

## 2020-06-08 DIAGNOSIS — K56609 Unspecified intestinal obstruction, unspecified as to partial versus complete obstruction: Secondary | ICD-10-CM | POA: Diagnosis not present

## 2020-06-13 DIAGNOSIS — S72144D Nondisplaced intertrochanteric fracture of right femur, subsequent encounter for closed fracture with routine healing: Secondary | ICD-10-CM | POA: Diagnosis not present

## 2020-06-13 DIAGNOSIS — I129 Hypertensive chronic kidney disease with stage 1 through stage 4 chronic kidney disease, or unspecified chronic kidney disease: Secondary | ICD-10-CM | POA: Diagnosis not present

## 2020-06-13 DIAGNOSIS — E1151 Type 2 diabetes mellitus with diabetic peripheral angiopathy without gangrene: Secondary | ICD-10-CM | POA: Diagnosis not present

## 2020-06-13 DIAGNOSIS — K56609 Unspecified intestinal obstruction, unspecified as to partial versus complete obstruction: Secondary | ICD-10-CM | POA: Diagnosis not present

## 2020-06-13 DIAGNOSIS — N1832 Chronic kidney disease, stage 3b: Secondary | ICD-10-CM | POA: Diagnosis not present

## 2020-06-13 DIAGNOSIS — E1122 Type 2 diabetes mellitus with diabetic chronic kidney disease: Secondary | ICD-10-CM | POA: Diagnosis not present

## 2020-06-14 DIAGNOSIS — S72144D Nondisplaced intertrochanteric fracture of right femur, subsequent encounter for closed fracture with routine healing: Secondary | ICD-10-CM | POA: Diagnosis not present

## 2020-06-14 DIAGNOSIS — E1122 Type 2 diabetes mellitus with diabetic chronic kidney disease: Secondary | ICD-10-CM | POA: Diagnosis not present

## 2020-06-14 DIAGNOSIS — N1832 Chronic kidney disease, stage 3b: Secondary | ICD-10-CM | POA: Diagnosis not present

## 2020-06-14 DIAGNOSIS — E1151 Type 2 diabetes mellitus with diabetic peripheral angiopathy without gangrene: Secondary | ICD-10-CM | POA: Diagnosis not present

## 2020-06-14 DIAGNOSIS — I129 Hypertensive chronic kidney disease with stage 1 through stage 4 chronic kidney disease, or unspecified chronic kidney disease: Secondary | ICD-10-CM | POA: Diagnosis not present

## 2020-06-14 DIAGNOSIS — K56609 Unspecified intestinal obstruction, unspecified as to partial versus complete obstruction: Secondary | ICD-10-CM | POA: Diagnosis not present

## 2020-06-16 ENCOUNTER — Other Ambulatory Visit: Payer: Self-pay | Admitting: *Deleted

## 2020-06-16 DIAGNOSIS — I2510 Atherosclerotic heart disease of native coronary artery without angina pectoris: Secondary | ICD-10-CM | POA: Diagnosis not present

## 2020-06-16 DIAGNOSIS — E1151 Type 2 diabetes mellitus with diabetic peripheral angiopathy without gangrene: Secondary | ICD-10-CM | POA: Diagnosis not present

## 2020-06-16 DIAGNOSIS — S72144D Nondisplaced intertrochanteric fracture of right femur, subsequent encounter for closed fracture with routine healing: Secondary | ICD-10-CM | POA: Diagnosis not present

## 2020-06-16 DIAGNOSIS — N1832 Chronic kidney disease, stage 3b: Secondary | ICD-10-CM | POA: Diagnosis not present

## 2020-06-16 DIAGNOSIS — D649 Anemia, unspecified: Secondary | ICD-10-CM | POA: Diagnosis not present

## 2020-06-16 DIAGNOSIS — I771 Stricture of artery: Secondary | ICD-10-CM | POA: Diagnosis not present

## 2020-06-16 DIAGNOSIS — G309 Alzheimer's disease, unspecified: Secondary | ICD-10-CM | POA: Diagnosis not present

## 2020-06-16 DIAGNOSIS — E1122 Type 2 diabetes mellitus with diabetic chronic kidney disease: Secondary | ICD-10-CM | POA: Diagnosis not present

## 2020-06-16 DIAGNOSIS — F0280 Dementia in other diseases classified elsewhere without behavioral disturbance: Secondary | ICD-10-CM | POA: Diagnosis not present

## 2020-06-16 DIAGNOSIS — Z7984 Long term (current) use of oral hypoglycemic drugs: Secondary | ICD-10-CM | POA: Diagnosis not present

## 2020-06-16 DIAGNOSIS — K56609 Unspecified intestinal obstruction, unspecified as to partial versus complete obstruction: Secondary | ICD-10-CM | POA: Diagnosis not present

## 2020-06-16 DIAGNOSIS — Z7982 Long term (current) use of aspirin: Secondary | ICD-10-CM | POA: Diagnosis not present

## 2020-06-16 DIAGNOSIS — I739 Peripheral vascular disease, unspecified: Secondary | ICD-10-CM | POA: Diagnosis not present

## 2020-06-16 DIAGNOSIS — I129 Hypertensive chronic kidney disease with stage 1 through stage 4 chronic kidney disease, or unspecified chronic kidney disease: Secondary | ICD-10-CM | POA: Diagnosis not present

## 2020-06-16 NOTE — Patient Outreach (Signed)
[facility]) Care Management  06/16/2020  [place] M Heaphy 1925-11-20 [id number]   Call placed to member's husband, no answer for both home and cell numbers.  No answer, HPAA compliant voice messages left. Call then placed to daughter in law [place], no answer, message left.  Will follow up within he next 3-4 business days.    Update:  Incoming call received from husband, report member is improving since her most recent hospitalization.  She denies any abdominal pain, nausea, vomiting, or constipation.  Able to eat solid foods without any issues.  He state home health remains involved, PT coming twice a week.  He has been able to get her out and about in the community more during the last few weeks.  Was seen by PCP a couple weeks ago, will follow up within the next 3 months.  Received her flu shot at that time.  Denies any urgent concerns, agrees to follow up within the next month.  Goals Addressed            This Visit's Progress   . [facility] - Enhance My Mental Skills   On track    Follow Up Date 12/1   - check out volunteer opportunities - stay in touch with my family and friends - take a walk daily and think about what I am seeing    Why is this important?   As we age, or sometimes because we have an illness, it feels like our memory and ability to figure things out is not very good.  There are things you can do to keep your memory and your thinking as strong as possible.    Notes:   11/15 - discussed interventions for interaction with others    . COMPLETED: [facility] - Make and Keep All Appointments       Follow Up Date 11/14   - call to cancel if needed - keep a calendar with appointment dates    Why is this important?   Part of staying healthy is seeing the doctor for follow-up care.  If you forget your appointments, there are some things you can do to stay on track.    Notes:  Need PCP      Kemper Durie, RN, MSN [facility] Care Management  [facility] Manager [phone]

## 2020-06-20 DIAGNOSIS — E1151 Type 2 diabetes mellitus with diabetic peripheral angiopathy without gangrene: Secondary | ICD-10-CM | POA: Diagnosis not present

## 2020-06-20 DIAGNOSIS — S72144D Nondisplaced intertrochanteric fracture of right femur, subsequent encounter for closed fracture with routine healing: Secondary | ICD-10-CM | POA: Diagnosis not present

## 2020-06-20 DIAGNOSIS — N1832 Chronic kidney disease, stage 3b: Secondary | ICD-10-CM | POA: Diagnosis not present

## 2020-06-20 DIAGNOSIS — I129 Hypertensive chronic kidney disease with stage 1 through stage 4 chronic kidney disease, or unspecified chronic kidney disease: Secondary | ICD-10-CM | POA: Diagnosis not present

## 2020-06-20 DIAGNOSIS — K56609 Unspecified intestinal obstruction, unspecified as to partial versus complete obstruction: Secondary | ICD-10-CM | POA: Diagnosis not present

## 2020-06-20 DIAGNOSIS — E1122 Type 2 diabetes mellitus with diabetic chronic kidney disease: Secondary | ICD-10-CM | POA: Diagnosis not present

## 2020-06-27 DIAGNOSIS — I129 Hypertensive chronic kidney disease with stage 1 through stage 4 chronic kidney disease, or unspecified chronic kidney disease: Secondary | ICD-10-CM | POA: Diagnosis not present

## 2020-06-27 DIAGNOSIS — E1151 Type 2 diabetes mellitus with diabetic peripheral angiopathy without gangrene: Secondary | ICD-10-CM | POA: Diagnosis not present

## 2020-06-27 DIAGNOSIS — K56609 Unspecified intestinal obstruction, unspecified as to partial versus complete obstruction: Secondary | ICD-10-CM | POA: Diagnosis not present

## 2020-06-27 DIAGNOSIS — N1832 Chronic kidney disease, stage 3b: Secondary | ICD-10-CM | POA: Diagnosis not present

## 2020-06-27 DIAGNOSIS — S72144D Nondisplaced intertrochanteric fracture of right femur, subsequent encounter for closed fracture with routine healing: Secondary | ICD-10-CM | POA: Diagnosis not present

## 2020-06-27 DIAGNOSIS — E1122 Type 2 diabetes mellitus with diabetic chronic kidney disease: Secondary | ICD-10-CM | POA: Diagnosis not present

## 2020-07-10 ENCOUNTER — Other Ambulatory Visit: Payer: Self-pay | Admitting: *Deleted

## 2020-07-10 NOTE — Patient Outreach (Signed)
[facility]) Care Management  07/10/2020  [place] M Counihan [birth year] [id number]   Incoming call received from son and daughter in law expressing concern for member and her husband's well being.  State neither the member nor her husband has slept in about a week, confusion has increased since home health has completed their services.  Reportedly member is not taking medications and husband is having a hard time managing her care independently at home.  They are requesting a home assessment for needs of the member as well as the husband.  They are hoping to keep the member in the home as long as possible but open to all options/recommendations.  Referral placed to [facility] for home evaluation, will follow up with member/daughter in law on 12/10 as planned.  Kemper Durie, [place], MSN [facility] Care Management  [facility] Manager [phone]

## 2020-07-14 ENCOUNTER — Other Ambulatory Visit: Payer: Self-pay | Admitting: *Deleted

## 2020-07-14 NOTE — Patient Outreach (Signed)
[facility]) Care Management  07/14/2020  [place] M Lodge April 25, 1926 [id number]   Call placed to [facility] with [facility] to follow up on home assessment/evaluation.  State member's husband is doing well with caring for the member although they do experience some bad days.  He is managing medications adequately, with member's dementia, she does not always take them.  They are planning to continue home visits at least weekly, will contact this care manager for any specifics regarding needs.  RN will make another visit next Tuesday, social worker will also be making visit.  Call placed to member's daughter in law [name] to provide update, she expresses gratitude, denies further questions at this time.  This care manager will continue to collaborate with [facility], will follow up with member/family within the next month.  Goals Addressed            This Visit's Progress   . [facility] - Behavior Symptoms Management       Follow up date ; 09/03/2020  Notes: Referral placed to [facility] to help with increasing support in the home and adherence to plan of care    . [facility] - Enhance My Mental Skills   On track    Follow Up Date 08/14/2020   - check out volunteer opportunities - stay in touch with my family and friends - take a walk daily and think about what I am seeing    Why is this important?   As we age, or sometimes because we have an illness, it feels like our memory and ability to figure things out is not very good.  There are things you can do to keep your memory and your thinking as strong as possible.    Notes:   11/15 - discussed interventions for interaction with others      Kemper Durie, RN, MSN [facility] Care Management  [facility] Manager [phone]

## 2020-07-15 ENCOUNTER — Inpatient Hospital Stay (HOSPITAL_COMMUNITY)
Admission: EM | Admit: 2020-07-15 | Discharge: 2020-07-20 | DRG: 389 | Disposition: A | Discharge status: Home Health | Payer: Medicare Other | Attending: Internal Medicine | Admitting: Internal Medicine

## 2020-07-15 ENCOUNTER — Other Ambulatory Visit: Payer: Self-pay

## 2020-07-15 DIAGNOSIS — I10 Essential (primary) hypertension: Secondary | ICD-10-CM | POA: Diagnosis present

## 2020-07-15 DIAGNOSIS — Z888 Allergy status to other drugs, medicaments and biological substances status: Secondary | ICD-10-CM

## 2020-07-15 DIAGNOSIS — R52 Pain, unspecified: Secondary | ICD-10-CM | POA: Diagnosis not present

## 2020-07-15 DIAGNOSIS — Z951 Presence of aortocoronary bypass graft: Secondary | ICD-10-CM

## 2020-07-15 DIAGNOSIS — Z20822 Contact with and (suspected) exposure to covid-19: Secondary | ICD-10-CM | POA: Diagnosis present

## 2020-07-15 DIAGNOSIS — R64 Cachexia: Secondary | ICD-10-CM | POA: Diagnosis present

## 2020-07-15 DIAGNOSIS — E785 Hyperlipidemia, unspecified: Secondary | ICD-10-CM | POA: Diagnosis present

## 2020-07-15 DIAGNOSIS — G309 Alzheimer's disease, unspecified: Secondary | ICD-10-CM | POA: Diagnosis present

## 2020-07-15 DIAGNOSIS — M159 Polyosteoarthritis, unspecified: Secondary | ICD-10-CM | POA: Diagnosis present

## 2020-07-15 DIAGNOSIS — I959 Hypotension, unspecified: Secondary | ICD-10-CM | POA: Diagnosis not present

## 2020-07-15 DIAGNOSIS — Z79899 Other long term (current) drug therapy: Secondary | ICD-10-CM

## 2020-07-15 DIAGNOSIS — R54 Age-related physical debility: Secondary | ICD-10-CM | POA: Diagnosis present

## 2020-07-15 DIAGNOSIS — Z6820 Body mass index (BMI) 20.0-20.9, adult: Secondary | ICD-10-CM

## 2020-07-15 DIAGNOSIS — K567 Ileus, unspecified: Secondary | ICD-10-CM

## 2020-07-15 DIAGNOSIS — F419 Anxiety disorder, unspecified: Secondary | ICD-10-CM | POA: Diagnosis present

## 2020-07-15 DIAGNOSIS — R109 Unspecified abdominal pain: Secondary | ICD-10-CM | POA: Diagnosis not present

## 2020-07-15 DIAGNOSIS — F0281 Dementia in other diseases classified elsewhere with behavioral disturbance: Secondary | ICD-10-CM | POA: Diagnosis not present

## 2020-07-15 DIAGNOSIS — D509 Iron deficiency anemia, unspecified: Secondary | ICD-10-CM | POA: Diagnosis present

## 2020-07-15 DIAGNOSIS — R1011 Right upper quadrant pain: Secondary | ICD-10-CM | POA: Diagnosis not present

## 2020-07-15 DIAGNOSIS — E7800 Pure hypercholesterolemia, unspecified: Secondary | ICD-10-CM | POA: Diagnosis present

## 2020-07-15 DIAGNOSIS — K56609 Unspecified intestinal obstruction, unspecified as to partial versus complete obstruction: Principal | ICD-10-CM | POA: Diagnosis present

## 2020-07-15 DIAGNOSIS — Z7984 Long term (current) use of oral hypoglycemic drugs: Secondary | ICD-10-CM

## 2020-07-15 DIAGNOSIS — R0902 Hypoxemia: Secondary | ICD-10-CM | POA: Diagnosis not present

## 2020-07-15 DIAGNOSIS — Z7983 Long term (current) use of bisphosphonates: Secondary | ICD-10-CM

## 2020-07-15 DIAGNOSIS — N1832 Chronic kidney disease, stage 3b: Secondary | ICD-10-CM | POA: Diagnosis present

## 2020-07-15 DIAGNOSIS — Z66 Do not resuscitate: Secondary | ICD-10-CM | POA: Diagnosis not present

## 2020-07-15 DIAGNOSIS — F02818 Dementia in other diseases classified elsewhere, unspecified severity, with other behavioral disturbance: Secondary | ICD-10-CM | POA: Diagnosis present

## 2020-07-15 DIAGNOSIS — K8020 Calculus of gallbladder without cholecystitis without obstruction: Secondary | ICD-10-CM | POA: Diagnosis not present

## 2020-07-15 DIAGNOSIS — E1151 Type 2 diabetes mellitus with diabetic peripheral angiopathy without gangrene: Secondary | ICD-10-CM | POA: Diagnosis present

## 2020-07-15 DIAGNOSIS — I252 Old myocardial infarction: Secondary | ICD-10-CM

## 2020-07-15 DIAGNOSIS — I43 Cardiomyopathy in diseases classified elsewhere: Secondary | ICD-10-CM | POA: Diagnosis present

## 2020-07-15 DIAGNOSIS — I1310 Hypertensive heart and chronic kidney disease without heart failure, with stage 1 through stage 4 chronic kidney disease, or unspecified chronic kidney disease: Secondary | ICD-10-CM | POA: Diagnosis present

## 2020-07-15 DIAGNOSIS — N183 Chronic kidney disease, stage 3 unspecified: Secondary | ICD-10-CM | POA: Diagnosis present

## 2020-07-15 DIAGNOSIS — I2510 Atherosclerotic heart disease of native coronary artery without angina pectoris: Secondary | ICD-10-CM | POA: Diagnosis present

## 2020-07-15 DIAGNOSIS — K8000 Calculus of gallbladder with acute cholecystitis without obstruction: Secondary | ICD-10-CM | POA: Diagnosis present

## 2020-07-15 DIAGNOSIS — E1122 Type 2 diabetes mellitus with diabetic chronic kidney disease: Secondary | ICD-10-CM | POA: Diagnosis present

## 2020-07-15 DIAGNOSIS — E119 Type 2 diabetes mellitus without complications: Secondary | ICD-10-CM

## 2020-07-15 DIAGNOSIS — Z7982 Long term (current) use of aspirin: Secondary | ICD-10-CM

## 2020-07-15 DIAGNOSIS — R1084 Generalized abdominal pain: Secondary | ICD-10-CM | POA: Diagnosis not present

## 2020-07-15 DIAGNOSIS — Z90710 Acquired absence of both cervix and uterus: Secondary | ICD-10-CM

## 2020-07-15 DIAGNOSIS — Z833 Family history of diabetes mellitus: Secondary | ICD-10-CM

## 2020-07-15 DIAGNOSIS — Z87440 Personal history of urinary (tract) infections: Secondary | ICD-10-CM

## 2020-07-15 LAB — COMPREHENSIVE METABOLIC PANEL
ALT: 9 U/L (ref 0–44)
AST: 18 U/L (ref 15–41)
Albumin: 3.6 g/dL (ref 3.5–5.0)
Alkaline Phosphatase: 58 U/L (ref 38–126)
Anion gap: 12 (ref 5–15)
BUN: 20 mg/dL (ref 8–23)
CO2: 24 mmol/L (ref 22–32)
Calcium: 9.2 mg/dL (ref 8.9–10.3)
Chloride: 102 mmol/L (ref 98–111)
Creatinine, Ser: 1.25 mg/dL — ABNORMAL HIGH (ref 0.44–1.00)
GFR, Estimated: 40 mL/min — ABNORMAL LOW (ref >60)
Glucose, Bld: 207 mg/dL — ABNORMAL HIGH (ref 70–99)
Potassium: 3.5 mmol/L (ref 3.5–5.1)
Sodium: 138 mmol/L (ref 135–145)
Total Bilirubin: 0.7 mg/dL (ref 0.3–1.2)
Total Protein: 6.6 g/dL (ref 6.5–8.1)

## 2020-07-15 LAB — CBC
HCT: 34.4 % — ABNORMAL LOW (ref 36.0–46.0)
Hemoglobin: 11.2 g/dL — ABNORMAL LOW (ref 12.0–15.0)
MCH: 28.1 pg (ref 26.0–34.0)
MCHC: 32.6 g/dL (ref 30.0–36.0)
MCV: 86.2 fL (ref 80.0–100.0)
Platelets: 200 10*3/uL (ref 150–400)
RBC: 3.99 MIL/uL (ref 3.87–5.11)
RDW: 15.7 % — ABNORMAL HIGH (ref 11.5–15.5)
WBC: 8.6 10*3/uL (ref 4.0–10.5)
nRBC: 0 % (ref 0.0–0.2)

## 2020-07-15 LAB — LIPASE, BLOOD: Lipase: 20 U/L (ref 11–51)

## 2020-07-15 NOTE — ED Triage Notes (Signed)
Pt here for RUQ abdominal pain with decreased appetite onset today. Normal bowel and bladder habits. Denies n/v. Hx dementia.

## 2020-07-16 ENCOUNTER — Emergency Department (HOSPITAL_COMMUNITY): Payer: Medicare Other

## 2020-07-16 ENCOUNTER — Inpatient Hospital Stay (HOSPITAL_COMMUNITY): Payer: Medicare Other

## 2020-07-16 DIAGNOSIS — E119 Type 2 diabetes mellitus without complications: Secondary | ICD-10-CM | POA: Diagnosis not present

## 2020-07-16 DIAGNOSIS — K56609 Unspecified intestinal obstruction, unspecified as to partial versus complete obstruction: Secondary | ICD-10-CM | POA: Diagnosis present

## 2020-07-16 DIAGNOSIS — G308 Other Alzheimer's disease: Secondary | ICD-10-CM

## 2020-07-16 DIAGNOSIS — I1310 Hypertensive heart and chronic kidney disease without heart failure, with stage 1 through stage 4 chronic kidney disease, or unspecified chronic kidney disease: Secondary | ICD-10-CM | POA: Diagnosis present

## 2020-07-16 DIAGNOSIS — I252 Old myocardial infarction: Secondary | ICD-10-CM | POA: Diagnosis not present

## 2020-07-16 DIAGNOSIS — R64 Cachexia: Secondary | ICD-10-CM | POA: Diagnosis present

## 2020-07-16 DIAGNOSIS — E1122 Type 2 diabetes mellitus with diabetic chronic kidney disease: Secondary | ICD-10-CM | POA: Diagnosis present

## 2020-07-16 DIAGNOSIS — K560 Paralytic ileus: Secondary | ICD-10-CM | POA: Diagnosis not present

## 2020-07-16 DIAGNOSIS — I2510 Atherosclerotic heart disease of native coronary artery without angina pectoris: Secondary | ICD-10-CM | POA: Diagnosis present

## 2020-07-16 DIAGNOSIS — I10 Essential (primary) hypertension: Secondary | ICD-10-CM

## 2020-07-16 DIAGNOSIS — M159 Polyosteoarthritis, unspecified: Secondary | ICD-10-CM | POA: Diagnosis present

## 2020-07-16 DIAGNOSIS — K567 Ileus, unspecified: Secondary | ICD-10-CM | POA: Diagnosis not present

## 2020-07-16 DIAGNOSIS — Z7984 Long term (current) use of oral hypoglycemic drugs: Secondary | ICD-10-CM | POA: Diagnosis not present

## 2020-07-16 DIAGNOSIS — N1831 Chronic kidney disease, stage 3a: Secondary | ICD-10-CM | POA: Diagnosis not present

## 2020-07-16 DIAGNOSIS — E7800 Pure hypercholesterolemia, unspecified: Secondary | ICD-10-CM | POA: Diagnosis present

## 2020-07-16 DIAGNOSIS — D509 Iron deficiency anemia, unspecified: Secondary | ICD-10-CM | POA: Diagnosis present

## 2020-07-16 DIAGNOSIS — Z7982 Long term (current) use of aspirin: Secondary | ICD-10-CM | POA: Diagnosis not present

## 2020-07-16 DIAGNOSIS — Z6820 Body mass index (BMI) 20.0-20.9, adult: Secondary | ICD-10-CM | POA: Diagnosis not present

## 2020-07-16 DIAGNOSIS — K8000 Calculus of gallbladder with acute cholecystitis without obstruction: Secondary | ICD-10-CM | POA: Diagnosis present

## 2020-07-16 DIAGNOSIS — R54 Age-related physical debility: Secondary | ICD-10-CM | POA: Diagnosis present

## 2020-07-16 DIAGNOSIS — F0281 Dementia in other diseases classified elsewhere with behavioral disturbance: Secondary | ICD-10-CM

## 2020-07-16 DIAGNOSIS — Z20822 Contact with and (suspected) exposure to covid-19: Secondary | ICD-10-CM | POA: Diagnosis present

## 2020-07-16 DIAGNOSIS — R109 Unspecified abdominal pain: Secondary | ICD-10-CM | POA: Diagnosis not present

## 2020-07-16 DIAGNOSIS — N1832 Chronic kidney disease, stage 3b: Secondary | ICD-10-CM | POA: Diagnosis present

## 2020-07-16 DIAGNOSIS — E1151 Type 2 diabetes mellitus with diabetic peripheral angiopathy without gangrene: Secondary | ICD-10-CM | POA: Diagnosis present

## 2020-07-16 DIAGNOSIS — Z7983 Long term (current) use of bisphosphonates: Secondary | ICD-10-CM | POA: Diagnosis not present

## 2020-07-16 DIAGNOSIS — E785 Hyperlipidemia, unspecified: Secondary | ICD-10-CM | POA: Diagnosis present

## 2020-07-16 DIAGNOSIS — K8020 Calculus of gallbladder without cholecystitis without obstruction: Secondary | ICD-10-CM | POA: Diagnosis present

## 2020-07-16 DIAGNOSIS — K6389 Other specified diseases of intestine: Secondary | ICD-10-CM | POA: Diagnosis not present

## 2020-07-16 DIAGNOSIS — I43 Cardiomyopathy in diseases classified elsewhere: Secondary | ICD-10-CM | POA: Diagnosis present

## 2020-07-16 DIAGNOSIS — G309 Alzheimer's disease, unspecified: Secondary | ICD-10-CM | POA: Diagnosis present

## 2020-07-16 DIAGNOSIS — F419 Anxiety disorder, unspecified: Secondary | ICD-10-CM | POA: Diagnosis present

## 2020-07-16 DIAGNOSIS — K5669 Other partial intestinal obstruction: Secondary | ICD-10-CM | POA: Diagnosis not present

## 2020-07-16 DIAGNOSIS — Z66 Do not resuscitate: Secondary | ICD-10-CM | POA: Diagnosis present

## 2020-07-16 DIAGNOSIS — Z4682 Encounter for fitting and adjustment of non-vascular catheter: Secondary | ICD-10-CM | POA: Diagnosis not present

## 2020-07-16 LAB — GLUCOSE, CAPILLARY: Glucose-Capillary: 112 mg/dL — ABNORMAL HIGH (ref 70–99)

## 2020-07-16 LAB — RESP PANEL BY RT-PCR (FLU A&B, COVID) ARPGX2
Influenza A by PCR: NEGATIVE
Influenza B by PCR: NEGATIVE
SARS Coronavirus 2 by RT PCR: NEGATIVE

## 2020-07-16 LAB — HEMOGLOBIN A1C
Hgb A1c MFr Bld: 6 % — ABNORMAL HIGH (ref 4.8–5.6)
Mean Plasma Glucose: 125.5 mg/dL

## 2020-07-16 LAB — CBG MONITORING, ED: Glucose-Capillary: 104 mg/dL — ABNORMAL HIGH (ref 70–99)

## 2020-07-16 MED ORDER — PANTOPRAZOLE SODIUM 40 MG IV SOLR
40.0000 mg | Freq: Two times a day (BID) | INTRAVENOUS | Status: DC
  Administered 2020-07-16 – 2020-07-19 (×7): 40 mg via INTRAVENOUS
  Filled 2020-07-16 (×7): qty 40

## 2020-07-16 MED ORDER — LORAZEPAM 2 MG/ML IJ SOLN
0.5000 mg | Freq: Once | INTRAMUSCULAR | Status: AC
  Administered 2020-07-16: 22:00:00 0.5 mg via INTRAVENOUS
  Filled 2020-07-16: qty 1

## 2020-07-16 MED ORDER — ONDANSETRON HCL 4 MG PO TABS: 4.0000 mg | ORAL_TABLET | Freq: Four times a day (QID) | ORAL | Status: DC | PRN

## 2020-07-16 MED ORDER — ONDANSETRON HCL 4 MG/2ML IJ SOLN
4.0000 mg | Freq: Four times a day (QID) | INTRAMUSCULAR | Status: DC | PRN
  Administered 2020-07-17: 21:00:00 4 mg via INTRAVENOUS
  Filled 2020-07-16: qty 2

## 2020-07-16 MED ORDER — ACETAMINOPHEN 650 MG RE SUPP: 650.0000 mg | Freq: Four times a day (QID) | RECTAL | Status: DC | PRN

## 2020-07-16 MED ORDER — SODIUM CHLORIDE 0.9 % IV SOLN
2.0000 g | Freq: Every day | INTRAVENOUS | Status: DC
  Administered 2020-07-16: 12:00:00 2 g via INTRAVENOUS
  Filled 2020-07-16: qty 20

## 2020-07-16 MED ORDER — IOHEXOL 300 MG/ML  SOLN
80.0000 mL | Freq: Once | INTRAMUSCULAR | Status: AC | PRN
  Administered 2020-07-16: 08:00:00 80 mL via INTRAVENOUS

## 2020-07-16 MED ORDER — DICLOFENAC SODIUM 1 % EX GEL
4.0000 g | Freq: Four times a day (QID) | CUTANEOUS | Status: DC | PRN
  Filled 2020-07-16: qty 100

## 2020-07-16 MED ORDER — POTASSIUM CHLORIDE IN NACL 20-0.9 MEQ/L-% IV SOLN
INTRAVENOUS | Status: DC
  Filled 2020-07-16: qty 1000

## 2020-07-16 MED ORDER — ACETAMINOPHEN 325 MG PO TABS
650.0000 mg | ORAL_TABLET | Freq: Four times a day (QID) | ORAL | Status: DC | PRN
  Administered 2020-07-18: 650 mg via ORAL
  Filled 2020-07-16: qty 2

## 2020-07-16 MED ORDER — ENOXAPARIN SODIUM 30 MG/0.3ML ~~LOC~~ SOLN
30.0000 mg | Freq: Every day | SUBCUTANEOUS | Status: DC
  Administered 2020-07-16 – 2020-07-19 (×4): 30 mg via SUBCUTANEOUS
  Filled 2020-07-16 (×5): qty 0.3

## 2020-07-16 MED ORDER — INSULIN ASPART 100 UNIT/ML ~~LOC~~ SOLN
0.0000 [IU] | Freq: Four times a day (QID) | SUBCUTANEOUS | Status: DC
  Administered 2020-07-19: 18:00:00 2 [IU] via SUBCUTANEOUS

## 2020-07-16 MED ORDER — HYDRALAZINE HCL 25 MG PO TABS
100.0000 mg | ORAL_TABLET | Freq: Once | ORAL | Status: AC
  Administered 2020-07-16: 08:00:00 100 mg via ORAL
  Filled 2020-07-16: qty 4

## 2020-07-16 MED ORDER — LORAZEPAM 2 MG/ML IJ SOLN
0.5000 mg | Freq: Once | INTRAMUSCULAR | Status: AC
  Administered 2020-07-16: 11:00:00 0.5 mg via INTRAVENOUS
  Filled 2020-07-16: qty 1

## 2020-07-16 MED ORDER — DIATRIZOATE MEGLUMINE & SODIUM 66-10 % PO SOLN
90.0000 mL | Freq: Once | ORAL | Status: AC
  Administered 2020-07-16: 13:00:00 90 mL via NASOGASTRIC
  Filled 2020-07-16: qty 90

## 2020-07-16 MED ORDER — ALBUTEROL SULFATE (2.5 MG/3ML) 0.083% IN NEBU: 2.5000 mg | INHALATION_SOLUTION | Freq: Four times a day (QID) | RESPIRATORY_TRACT | Status: DC | PRN

## 2020-07-16 MED ORDER — HYDRALAZINE HCL 20 MG/ML IJ SOLN
10.0000 mg | INTRAMUSCULAR | Status: DC | PRN
  Administered 2020-07-17: 21:00:00 10 mg via INTRAVENOUS
  Filled 2020-07-16: qty 1

## 2020-07-16 NOTE — ED Notes (Signed)
Patient transported to CT 

## 2020-07-16 NOTE — ED Provider Notes (Signed)
[facility] EMERGENCY DEPARTMENT Provider Note   CSN: [id number] Arrival date & time: 07/15/20  1700     History Chief Complaint  Patient presents with  . Abdominal Pain    [place] M Mccollum is a 86 y.o. female with pertinent past medical history of diabetes, coronary artery disease, hypertension, Alzheimer's dementia with behavioral disturbance with recent admission in October for small bowel obstruction that presents emergency department today for abdominal pain.  Patient is brought in by 73 year old husband who is caretaker.  Patient is level 5 caveat due to dementia.  Husband states that she has been complaining of abdominal pain for unknown amount of time, states that he thinks for maybe a week, however last night she was complaining of abdominal pain more than normal.  Triage note states that she is complaining of right upper quadrant abdominal pain, husband states that she is unsure of where it is hurting her.  Denies any nausea, vomiting or diarrhea.  Denies any chest pain or shortness of breath, patient husband denies any decreased urine output fevers or sick contacts, however he states that she is able to take care of herself at home.  Has not taken home meds this morning.  He states that last bowel movement was yesterday. Low PO intake this past week according to husband.   HPI     Past Medical History:  Diagnosis Date  . Arthritis    "qwhere"  . Coronary artery disease   . Hypercholesteremia   . Hypertension   . Myocardial infarction (HCC) 2003  . SBO (small bowel obstruction) (HCC) 12/18/2016  . Stenosis of subclavian artery (HCC)   . Type II diabetes mellitus [facility])     Patient Active Problem List   Diagnosis Date Noted  . S/P right hip fracture 05/14/2020  . DNR (do not resuscitate) 10/07/2019  . Alzheimer's dementia with behavioral disturbance (HCC)   . Palliative care encounter   . Failure to thrive (0-17) 10/06/2019  . Weakness  generalized 10/05/2019  . Hypernatremia 07/18/2019  . Hypokalemia 07/18/2019  . Elevated troponin 07/17/2019  . Acute lower UTI 07/17/2019  . Positive D dimer 07/17/2019  . Normocytic anemia 07/17/2019  . Cholelithiasis 01/02/2019  . CKD (chronic kidney disease), stage III (HCC) 10/21/2017  . Delirium due to another medical condition 12/18/2016  . SBO (small bowel obstruction) (HCC) 12/18/2016  . 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 Leukocytosis 12/18/2016  . Peripheral artery disease (HCC) 11/08/2014  . PAD (peripheral artery disease) (HCC) 10/24/2014  . LBBB (left bundle branch block) 09/27/2014    Class: Chronic  . Subclavian artery stenosis, left (HCC) 09/27/2014  . Numbness and tingling in left hand 03/08/2013  . HTN (hypertension) 03/08/2013  . DM (diabetes mellitus) (HCC) 03/08/2013  . CAD (coronary artery disease) 03/08/2013    Past Surgical History:  Procedure Laterality Date  . ABDOMINAL HYSTERECTOMY  1970  . BOWEL RESECTION N/A 12/20/2016   Procedure: SMALL BOWEL RESECTION;  Surgeon: Abigail Miyamoto, MD;  Location: [facility] OR;  Service: General;  Laterality: N/A;  . CARDIAC CATHETERIZATION    . CATARACT EXTRACTION Bilateral 2000's  . CORONARY ARTERY BYPASS GRAFT  2003   in [place], [place]  . LAPAROTOMY N/A 12/20/2016   Procedure: EXPLORATORY LAPAROTOMY;  Surgeon: Abigail Miyamoto, MD;  Location: [facility] OR;  Service: General;  Laterality: N/A;  . LEFT HEART CATHETERIZATION WITH CORONARY/GRAFT ANGIOGRAM N/A 09/27/2014   Procedure: LEFT HEART CATHETERIZATION WITH Isabel Caprice;  Surgeon: Pamella Pert, MD;  Location:  [facility] CATH LAB;  Service: Cardiovascular;  Laterality: N/A;  . UNILATERAL UPPER EXTREMEITY ANGIOGRAM N/A 09/27/2014   Procedure: SUBCLAVIAN ARTERIOGRAM ;  Surgeon: Pamella PertJagadeesh R Ganji, MD;  [facility];  Service: Cardiovascular;  Laterality: N/A;  . UNILATERAL UPPER EXTREMEITY ANGIOGRAM N/A 11/09/2014   Procedure: Left subclavian stent ;  Surgeon:  Yates DecampJay Ganji, MD;  [facility];  Service: Cardiovascular;  Laterality: N/A;  . VISCERAL ANGIOGRAM N/A 10/25/2014   Procedure: Laurence SlateSUBCLAVIAN ANGIOGRAM;  Surgeon: Yates DecampJay Ganji, MD;  [facility];  Service: Cardiovascular;  Laterality: N/A;     OB History   No obstetric history on file.     Family History  Problem Relation Age of Onset  . Diabetes Father   . Gallbladder disease Neg Hx     Social History   Tobacco Use  . Smoking status: Never Smoker  . Smokeless tobacco: Never Used  Vaping Use  . Vaping Use: Never used  Substance Use Topics  . Alcohol use: No  . Drug use: No    Home Medications Prior to Admission medications   Medication Sig Start Date End Date Taking? Authorizing Provider  alendronate (FOSAMAX) 70 MG tablet Take 70 mg by mouth every Monday.  12/08/14   [provider]  ALPRAZolam Prudy Feeler(XANAX) 0.5 MG tablet Take 0.5 mg by mouth 3 (three) times daily as needed for anxiety.  10/01/19   [provider]  amLODipine (NORVASC) 10 MG tablet Take 10 mg by mouth at bedtime.  10/12/14   [provider]  aspirin EC 81 MG tablet Take 81 mg by mouth daily.     [provider]  carvedilol (COREG) 6.25 MG tablet Take 12.5 mg by mouth 2 (two) times daily. 10/14/18   [provider]  Cholecalciferol (VITAMIN D3) 50 MCG (2000 UT) TABS Take 1 tablet by mouth daily.    [provider]  diclofenac Sodium (VOLTAREN) 1 % GEL Apply 4 g topically 4 (four) times daily. Patient taking differently: Apply 4 g topically 4 (four) times daily as needed (pain).  10/11/19   Drema DallasWoods, Curtis J, MD  divalproex (DEPAKOTE) 125 MG DR tablet Take 125 mg by mouth at bedtime. 03/11/20   [provider]  Ferrous Sulfate (IRON PO) Take 1 tablet by mouth daily.    [provider]  gabapentin (NEURONTIN) 300 MG capsule Take 600 mg by mouth at bedtime.  04/09/17   [provider]  hydrALAZINE (APRESOLINE) 100 MG tablet Take 1 tablet (100  mg total) by mouth 3 (three) times daily. 10/11/19   Drema DallasWoods, Curtis J, MD  lovastatin (MEVACOR) 40 MG tablet Take 1 tablet (40 mg total) by mouth every evening. 08/27/19   Toniann FailKelley, Ashton Haynes, NP  metFORMIN (GLUCOPHAGE) 500 MG tablet Take 500 mg by mouth at bedtime. 03/30/20   [provider]  ondansetron (ZOFRAN) 4 MG tablet Take 1 tablet (4 mg total) by mouth every 6 (six) hours as needed for nausea. 05/16/20   Pokhrel, Rebekah ChesterfieldLaxman, MD  sertraline (ZOLOFT) 25 MG tablet Take 25 mg by mouth daily. 03/11/20   [provider]    Allergies    Statins, Other, Crestor [rosuvastatin], Lipitor [atorvastatin], and Zocor [simvastatin]  Review of Systems   Review of Systems  Unable to perform ROS: Dementia    Physical Exam Updated Vital Signs BP (!) 156/72   Pulse 80   Temp (!) 96.9 F (36.1 C) (Temporal)   Resp 16   Ht 5\' 2"  (1.575  m)   Wt 51.7 kg   SpO2 98%   BMI 20.85 kg/m   Physical Exam Constitutional:      General: She is not in acute distress.    Appearance: Normal appearance. She is not ill-appearing, toxic-appearing or diaphoretic.  HENT:     Mouth/Throat:     Mouth: Mucous membranes are moist.     Pharynx: Oropharynx is clear.  Eyes:     General: No scleral icterus.    Extraocular Movements: Extraocular movements intact.     Pupils: Pupils are equal, round, and reactive to light.  Cardiovascular:     Rate and Rhythm: Normal rate and regular rhythm.     Pulses: Normal pulses.     Heart sounds: Normal heart sounds.  Pulmonary:     Effort: Pulmonary effort is normal. No respiratory distress.     Breath sounds: Normal breath sounds. No stridor. No wheezing, rhonchi or rales.  Chest:     Chest wall: No tenderness.  Abdominal:     General: Abdomen is flat. There is no distension.     Palpations: Abdomen is soft.     Tenderness: There is generalized abdominal tenderness and tenderness in the right upper quadrant. There is guarding. There is no rebound. Positive  signs include Murphy's sign.  Musculoskeletal:        General: No swelling or tenderness. Normal range of motion.     Cervical back: Normal range of motion and neck supple. No rigidity.     Right lower leg: No edema.     Left lower leg: No edema.  Skin:    General: Skin is warm and dry.     Capillary Refill: Capillary refill takes less than 2 seconds.     Coloration: Skin is not pale.  Neurological:     General: No focal deficit present.     Mental Status: She is alert and oriented to person, place, and time.  Psychiatric:        Mood and Affect: Mood normal.        Behavior: Behavior normal.     ED Results / Procedures / Treatments   Labs (all labs ordered are listed, but only abnormal results are displayed) Labs Reviewed  COMPREHENSIVE METABOLIC PANEL - Abnormal; Notable for the following components:      Result Value   Glucose, Bld 207 (*)    Creatinine, Ser 1.25 (*)    GFR, Estimated 40 (*)    All other components within normal limits  CBC - Abnormal; Notable for the following components:   Hemoglobin 11.2 (*)    HCT 34.4 (*)    RDW 15.7 (*)    All other components within normal limits  RESP PANEL BY RT-PCR (FLU A&B, COVID) ARPGX2  LIPASE, BLOOD  URINALYSIS, ROUTINE W REFLEX MICROSCOPIC    EKG None  Radiology CT Abdomen Pelvis W Contrast  Result Date: 07/16/2020 CLINICAL DATA:  Acute generalized abdominal pain. EXAM: CT ABDOMEN AND PELVIS WITH CONTRAST TECHNIQUE: Multidetector CT imaging of the abdomen and pelvis was performed using the standard protocol following bolus administration of intravenous contrast. CONTRAST:  80mL OMNIPAQUE IOHEXOL 300 MG/ML  SOLN COMPARISON:  May 13, 2020. FINDINGS: Lower chest: No acute abnormality. Hepatobiliary: Stable large gallstone is noted. No biliary dilatation is noted. The liver is unremarkable. Pancreas: Unremarkable. No pancreatic ductal dilatation or surrounding inflammatory changes. Spleen: Stable splenic cyst.  Adrenals/Urinary Tract: Adrenal glands are unremarkable. Kidneys are normal, without renal calculi, focal lesion, or hydronephrosis.  Bladder is unremarkable. Stomach/Bowel: Mild gastric distention is noted as well as proximal small bowel dilatation, concerning for small bowel obstruction. Distal small bowel loops are nondilated. The colon is nondilated. Sigmoid diverticulosis is noted without inflammation. Vascular/Lymphatic: Aortic atherosclerosis. No enlarged abdominal or pelvic lymph nodes. Reproductive: Status post hysterectomy. No adnexal masses. Other: Small amount of free fluid is noted in the pelvis. No hernia is noted. Musculoskeletal: No acute or significant osseous findings. IMPRESSION: 1. Mild gastric distention is noted as well as proximal small bowel dilatation, concerning for small bowel obstruction. This is not significantly changed compared to prior exam. 2. Stable large gallstone. 3. Sigmoid diverticulosis without inflammation. 4. Small amount of free fluid is noted in the pelvis. 5. Aortic atherosclerosis. Aortic Atherosclerosis (ICD10-I70.0). Electronically Signed   By: Lupita Raider M.D.   On: 07/16/2020 08:48   US Abdomen Limited RUQ (LIVER/GB)  Result Date: 07/16/2020 CLINICAL DATA:  Right upper quadrant abdominal pain. EXAM: ULTRASOUND ABDOMEN LIMITED RIGHT UPPER QUADRANT COMPARISON:  Dec 30, 2018. FINDINGS: Gallbladder: Gallbladder lumen is full of stones with gallbladder wall thickening at 8 mm. No pericholecystic fluid is noted. Positive sonographic Murphy's sign is noted. Common bile duct: Diameter: 3 mm which is within normal limits. Liver: No focal lesion identified. Within normal limits in parenchymal echogenicity. Portal vein is patent on color Doppler imaging with normal direction of blood flow towards the liver. Other: None. IMPRESSION: Cholelithiasis with gallbladder wall thickening and positive sonographic Murphy's sign concerning for acute cholecystitis. Electronically  Signed   By: Lupita Raider M.D.   On: 07/16/2020 10:04    Procedures Procedures (including critical care time)  Medications Ordered in ED Medications  hydrALAZINE (APRESOLINE) tablet 100 mg (100 mg Oral Given 07/16/20 0747)  iohexol (OMNIPAQUE) 300 MG/ML solution 80 mL (80 mLs Intravenous Contrast Given 07/16/20 0819)  LORazepam (ATIVAN) injection 0.5 mg (0.5 mg Intravenous Given 07/16/20 1034)    ED Course  I have reviewed the triage vital signs and the nursing notes.  Pertinent labs & imaging results that were available during my care of the patient were reviewed by me and considered in my medical decision making (see chart for details).    MDM Rules/Calculators/A&P                         [place] M Montroy is a 86 y.o. female with pertinent past medical history of diabetes, coronary artery disease, hypertension, Alzheimer's dementia with behavioral disturbance with recent admission in October for small bowel obstruction that presents emergency department today for abdominal pain.   Differential diagnoses considered include cholecystitis, cholelithiasis, hepatitis, SBO, gastritis, UTI.  Labs demonstrated unremarkable CBC and CMP, no leukocytosis, no elevation of transaminases.  Negative lipase.  Urine CT does show small bowel obstruction, similar to last presentation.  I think patient would benefit from admission at this time with NG tube since patient has recurrent obstruction with low PO intake.  Right upper quadrant ultrasound with sonographic Murphy sign, concerning for cholecystitis.  Will consult surgery at this time, labs are not consistent with cholecystitis I do assume that this is most likely pain from SBO.  Recommends medical admission at this time.  10:36 Spoke to Dr. Freida Busman, general surgery who will consult. 1109 splenic: Spoke to Dr. Katrinka Blazing, hospitalist who accept the patient.  The patient appears reasonably stabilized for admission considering the current  resources, flow, and capabilities available in the ED at this time, and I doubt any  other [facility] requiring further screening and/or treatment in the ED prior to admission.  I discussed this case with my attending physician who cosigned this note including patient's presenting symptoms, physical exam, and planned diagnostics and interventions. Attending physician stated agreement with plan or made changes to plan which were implemented.   Attending physician assessed patient at bedside.  Final Clinical Impression(s) / ED Diagnoses Final diagnoses:  RUQ pain  Small bowel obstruction [facility])    Rx / DC Orders ED Discharge Orders    None       Farrel Gordon, PA-C 07/16/20 1111    Maia Plan, MD 07/19/20 1245

## 2020-07-16 NOTE — Consult Note (Signed)
CC: Abdominal pain  Requesting provider: Emergency department   HPI: [place] Sonya Chavez is an 86 y.o. female who is here for abdominal pain. She had a recent admission in October for small bowel obstruction which resolved with non-operative management. She has a history of an open hysterectomy in the 1970s with a small bowel obstruction in 2018 requiring an exploratory laparotomy and lysis of adhesions. The patient is brought in by 60 year old husband who is caretaker. Per report, the husband states that she has been complaining of abdominal pain for maybe a week, however last night she was complaining of abdominal pain more than normal. Denies any nausea, vomiting or diarrhea.  Denies any chest pain or shortness of breath, patient husband denies any decreased urine output fevers or sick contacts, however he states that she is able to take care of herself at home. He states that last bowel movement was yesterday. Low PO intake this past week according to husband.   Workup in the ED included at CT scan which is suggestive of a small bowel obstruction. There is also a known large, complex calcified stone in the gallbladder. A right upper quadrant ultrasound was also obtained which demonstrated gallbladder wall thickening with sonographic positive Murphy's sign, but in the setting of the patient dementia and current mental status, unclear significance. He is hemodynamically stable without a leukocytosis and normal LFTs. An NGT was placed an returned nearly 1 L feculent appearing material. General surgery consulted for evaluation and treatment recommendations.   Past Medical History:  Diagnosis Date  . Arthritis    "qwhere"  . Coronary artery disease   . Hypercholesteremia   . Hypertension   . Myocardial infarction (HCC) 2003  . SBO (small bowel obstruction) (HCC) 12/18/2016  . Stenosis of subclavian artery (HCC)   . Type II diabetes mellitus (HCC)     Past Surgical History:  Procedure  Laterality Date  . ABDOMINAL HYSTERECTOMY  1970  . BOWEL RESECTION N/A 12/20/2016   Procedure: SMALL BOWEL RESECTION;  Surgeon: Abigail Miyamoto, MD;  Location: [facility] OR;  Service: General;  Laterality: N/A;  . CARDIAC CATHETERIZATION    . CATARACT EXTRACTION Bilateral 2000's  . CORONARY ARTERY BYPASS GRAFT  2003   in [place], [place]  . LAPAROTOMY N/A 12/20/2016   Procedure: EXPLORATORY LAPAROTOMY;  Surgeon: Abigail Miyamoto, MD;  Location: [facility] OR;  Service: General;  Laterality: N/A;  . LEFT HEART CATHETERIZATION WITH CORONARY/GRAFT ANGIOGRAM N/A 09/27/2014   Procedure: LEFT HEART CATHETERIZATION WITH Isabel Caprice;  Surgeon: Pamella Pert, MD;  Location: [facility] CATH LAB;  Service: Cardiovascular;  Laterality: N/A;  . UNILATERAL UPPER EXTREMEITY ANGIOGRAM N/A 09/27/2014   Procedure: SUBCLAVIAN ARTERIOGRAM ;  Surgeon: Pamella Pert, MD;  Location: [facility] CATH LAB;  Service: Cardiovascular;  Laterality: N/A;  . UNILATERAL UPPER EXTREMEITY ANGIOGRAM N/A 11/09/2014   Procedure: Left subclavian stent ;  Surgeon: Yates Decamp, MD;  Location: [facility] CATH LAB;  Service: Cardiovascular;  Laterality: N/A;  . VISCERAL ANGIOGRAM N/A 10/25/2014   Procedure: Laurence Slate;  Surgeon: Yates Decamp, MD;  Location: [facility] CATH LAB;  Service: Cardiovascular;  Laterality: N/A;    Family History  Problem Relation Age of Onset  . Diabetes Father   . Gallbladder disease Neg Hx     Social:  reports that she has never smoked. She has never used smokeless tobacco. She reports that she does not drink alcohol and does not use drugs.  Allergies:  Allergies  Allergen Reactions  . Statins Other (See  Comments)    Myalgias   . Other     NO FOODS WITH SEEDS  . Crestor [Rosuvastatin] Other (See Comments)    Myalgias  . Lipitor [Atorvastatin] Other (See Comments)    myalgias  . Zocor [Simvastatin] Other (See Comments)    myalgias    Medications: I have reviewed the patient's current medications.   ROS - Due  to the patient's mental status related to her dementia, a complete review of systems could not be obtained.   PE Blood pressure (!) 151/55, pulse 72, temperature 98.7 F (37.1 C), temperature source Temporal, resp. rate 16, height 5\' 2"  (1.575 Sonya), weight 51.7 kg, SpO2 95 %. Constitutional: Mild distress; not conversant; no deformities, demented  Eyes: Moist conjunctiva; no lid lag; anicteric; PERRL Neck: Trachea midline; no thyromegaly Lungs: Normal respiratory effort; no tactile fremitus CV: RRR; no palpable thrills; no pitting edema GI: Abd scaphoid, non-tender in setting of altered mental status, no rebound; no palpable hepatosplenomegaly MSK: Gait could not be assessed due to patient condition; no clubbing/cyanosis Psychiatric: Depressed affect; not alert Lymphatic: No palpable cervical or axillary lymphadenopathy Skin: no observed rash or lesions  Results for orders placed or performed during the hospital encounter of 07/15/20 (from the past 48 hour(s))  Lipase, blood     Status: None   Collection Time: 07/15/20  5:19 PM  Result Value Ref Range   Lipase 20 11 - 51 U/L    Comment: Performed at [facility], [address] [place], [place]  Comprehensive metabolic panel     Status: Abnormal   Collection Time: 07/15/20  5:19 PM  Result Value Ref Range   Sodium 138 135 - 145 mmol/L   Potassium 3.5 3.5 - 5.1 mmol/L   Chloride 102 98 - 111 mmol/L   CO2 24 22 - 32 mmol/L   Glucose, Bld 207 (H) 70 - 99 mg/dL    Comment: Glucose reference range applies only to samples taken after fasting for at least 8 hours.   BUN 20 8 - 23 mg/dL   Creatinine, Ser 14/11/21 (H) 0.44 - 1.00 mg/dL   Calcium 9.2 8.9 - 7.41 mg/dL   Total Protein 6.6 6.5 - 8.1 g/dL   Albumin 3.6 3.5 - 5.0 g/dL   AST 18 15 - 41 U/L   ALT 9 0 - 44 U/L   Alkaline Phosphatase 58 38 - 126 U/L   Total Bilirubin 0.7 0.3 - 1.2 mg/dL   GFR, Estimated 40 (L) >60 mL/min    Comment: (NOTE) Calculated using the  CKD-EPI Creatinine Equation (2021)    Anion gap 12 5 - 15    Comment: Performed at [facility] Lab, [address] [place], [place]  CBC     Status: Abnormal   Collection Time: 07/15/20  5:19 PM  Result Value Ref Range   WBC 8.6 4.0 - 10.5 K/uL   RBC 3.99 3.87 - 5.11 MIL/uL   Hemoglobin 11.2 (L) 12.0 - 15.0 g/dL   HCT 14/11/21 (L) 20.9 - 47.0 %   MCV 86.2 80.0 - 100.0 fL   MCH 28.1 26.0 - 34.0 pg   MCHC 32.6 30.0 - 36.0 g/dL   RDW 96.2 (H) 83.6 - 62.9 %   Platelets 200 150 - 400 K/uL   nRBC 0.0 0.0 - 0.2 %    Comment: Performed at [facility] Lab, [address], [place], [place]  Resp Panel by RT-PCR (Flu A&B, Covid)  Nasopharyngeal Swab     Status: None   Collection Time: 07/16/20  9:11 AM   Specimen: Nasopharyngeal Swab; Nasopharyngeal(NP) swabs in vial transport medium  Result Value Ref Range   SARS Coronavirus 2 by RT PCR NEGATIVE NEGATIVE    Comment: (NOTE) SARS-CoV-2 target nucleic acids are NOT DETECTED.  The SARS-CoV-2 RNA is generally detectable in upper respiratory specimens during the acute phase of infection. The lowest concentration of SARS-CoV-2 viral copies this assay can detect is 138 copies/mL. A negative result does not preclude SARS-Cov-2 infection and should not be used as the sole basis for treatment or other patient management decisions. A negative result may occur with  improper specimen collection/handling, submission of specimen other than nasopharyngeal swab, presence of viral mutation(s) within the areas targeted by this assay, and inadequate number of viral copies(<138 copies/mL). A negative result must be combined with clinical observations, patient history, and epidemiological information. The expected result is Negative.  Fact Sheet for Patients:  BloggerCourse.com  Fact Sheet for Healthcare Providers:  SeriousBroker.it  This test is no t yet approved or cleared by the  [place] FDA and  has been authorized for detection and/or diagnosis of SARS-CoV-2 by FDA under an Emergency Use Authorization (EUA). This EUA will remain  in effect (meaning this test can be used) for the duration of the COVID-19 declaration under Section 564(b)(1) of the Act, 21 U.S.C.section 360bbb-3(b)(1), unless the authorization is terminated  or revoked sooner.       Influenza A by PCR NEGATIVE NEGATIVE   Influenza B by PCR NEGATIVE NEGATIVE    Comment: (NOTE) The Xpert Xpress SARS-CoV-2/FLU/RSV plus assay is intended as an aid in the diagnosis of influenza from Nasopharyngeal swab specimens and should not be used as a sole basis for treatment. Nasal washings and aspirates are unacceptable for Xpert Xpress SARS-CoV-2/FLU/RSV testing.  Fact Sheet for Patients: BloggerCourse.com  Fact Sheet for Healthcare Providers: SeriousBroker.it  This test is not yet approved or cleared by the [place] FDA and has been authorized for detection and/or diagnosis of SARS-CoV-2 by FDA under an Emergency Use Authorization (EUA). This EUA will remain in effect (meaning this test can be used) for the duration of the COVID-19 declaration under Section 564(b)(1) of the Act, 21 U.S.C. section 360bbb-3(b)(1), unless the authorization is terminated or revoked.  Performed at [facility], [address], [place], [place] [address]     CT Abdomen Pelvis W Contrast  Result Date: 07/16/2020 CLINICAL DATA:  Acute generalized abdominal pain. EXAM: CT ABDOMEN AND PELVIS WITH CONTRAST TECHNIQUE: Multidetector CT imaging of the abdomen and pelvis was performed using the standard protocol following bolus administration of intravenous contrast. CONTRAST:  51mL OMNIPAQUE IOHEXOL 300 MG/ML  SOLN COMPARISON:  May 13, 2020. FINDINGS: Lower chest: No acute abnormality. Hepatobiliary: Stable large gallstone is noted. No biliary dilatation is  noted. The liver is unremarkable. Pancreas: Unremarkable. No pancreatic ductal dilatation or surrounding inflammatory changes. Spleen: Stable splenic cyst. Adrenals/Urinary Tract: Adrenal glands are unremarkable. Kidneys are normal, without renal calculi, focal lesion, or hydronephrosis. Bladder is unremarkable. Stomach/Bowel: Mild gastric distention is noted as well as proximal small bowel dilatation, concerning for small bowel obstruction. Distal small bowel loops are nondilated. The colon is nondilated. Sigmoid diverticulosis is noted without inflammation. Vascular/Lymphatic: Aortic atherosclerosis. No enlarged abdominal or pelvic lymph nodes. Reproductive: Status post hysterectomy. No adnexal masses. Other: Small amount of free fluid is noted in the pelvis. No hernia is noted. Musculoskeletal: No acute or  significant osseous findings. IMPRESSION: 1. Mild gastric distention is noted as well as proximal small bowel dilatation, concerning for small bowel obstruction. This is not significantly changed compared to prior exam. 2. Stable large gallstone. 3. Sigmoid diverticulosis without inflammation. 4. Small amount of free fluid is noted in the pelvis. 5. Aortic atherosclerosis. Aortic Atherosclerosis (ICD10-I70.0). Electronically Signed   By: Lupita RaiderJames  Green Jr Sonya.D.   On: 07/16/2020 08:48   DG Abd Portable 1 View  Result Date: 07/16/2020 CLINICAL DATA:  NG tube placement EXAM: PORTABLE ABDOMEN - 1 VIEW COMPARISON:  05/14/2020 FINDINGS: NG tube is folded in the distal stomach with the tip redirected to the fundus. Nonobstructive bowel gas pattern. Excreted contrast in the collecting systems of both kidneys and the bladder. Degenerative changes of the spine, and SI joints. Aortoiliac and femoral atherosclerosis present. IMPRESSION: NG tube folded in the distal stomach with the tip in the stomach fundus. Electronically Signed   By: Judie PetitM.  Shick Sonya.D.   On: 07/16/2020 11:50   US Abdomen Limited RUQ (LIVER/GB)  Result  Date: 07/16/2020 CLINICAL DATA:  Right upper quadrant abdominal pain. EXAM: ULTRASOUND ABDOMEN LIMITED RIGHT UPPER QUADRANT COMPARISON:  Dec 30, 2018. FINDINGS: Gallbladder: Gallbladder lumen is full of stones with gallbladder wall thickening at 8 mm. No pericholecystic fluid is noted. Positive sonographic Murphy's sign is noted. Common bile duct: Diameter: 3 mm which is within normal limits. Liver: No focal lesion identified. Within normal limits in parenchymal echogenicity. Portal vein is patent on color Doppler imaging with normal direction of blood flow towards the liver. Other: None. IMPRESSION: Cholelithiasis with gallbladder wall thickening and positive sonographic Murphy's sign concerning for acute cholecystitis. Electronically Signed   By: Lupita RaiderJames  Green Jr Sonya.D.   On: 07/16/2020 10:04    Imaging: As above  A/P: [place] Dulcy FannyM Varghese is an 86 y.o. female with likely an adhesive small bowel obstruction related to her prior abdominal operations. While there was suspicion of gallbladder pathology as the etiology of her symptoms given the findings on ultrasound, it is unlikely that she has acute cholecystitis without a leukocytosis and normal liver function tests.   - Admit to medicine team - NGT/NPO  - Small bowel obstruction protocol  - General surgery will continue to follow   Lannette Donathavid Niketa Turner, MD General Surgery

## 2020-07-16 NOTE — ED Notes (Signed)
Patient transported to Ultrasound 

## 2020-07-16 NOTE — H&P (Signed)
History and Physical    [place] Sonya Schneeberger SEG:[id number] DOB: 1925/08/25 DOA: 07/15/2020  Referring MD/NP/PA: Farrel Gordon, PA-C PCP: Alysia Penna, MD  Patient coming from: Home  Chief Complaint: Abdominal  I have personally briefly reviewed patient's old medical records in [facility]   HPI: [place] Sonya Chavez is a 86 y.o. female with medical history significant of Alzheimer's dementia, diabetes mellitus type 2, coronary artery disease, hypertension, and small bowel obstruction with prior presents with complaints of 2 to 3 days of abdominal pain.  Complains of having right upper quadrant abdominal pain the last 2 to 3 days that is sharp in nature.  She denies having any nausea or vomiting symptoms.  Her husband is her primary care taker and reports most of patient's history is mostly correct, but states that she did have some episodes of nausea and vomiting while here in the hospital.  He notes that she has had poor appetite here lately and has been more confused than normal.  Stating she had difficulty dressing herself and was getting lost in the house which was unusual.  Last bowel movement was reportedly yesterday.  Patient has had multiple abdominal surgeries including total abdominal hysterectomy and small bowel obstruction back in 2018 that required open laparotomy with resection of bowel.  Patient had last been hospitalized in October of this year with small bowel obstruction, but resolved with conservative measures.  ED Course: On admission into the emergency department patient was noted to be afebrile with blood pressure 103/74-180 9/59, and all other vital signs maintained.  Labs significant for WBC 11.2, BUN 20, creatinine 1.25, glucose 207, lipase 20, and LFTs within normal limits.  COVID-19 and influenza screening were negative.  CT scan of the abdomen and pelvis significant for concern for small bowel obstruction and stable large gallstone.  Right upper quadrant  ultrasound was significant for concern for a cholelithiasis with concern for acute cholecystitis.  General surgery was formally consulted.  NG tube was placed and patient was placed on normal saline IV fluids.   Review of Systems  Unable to perform ROS: Dementia  Constitutional: Positive for malaise/fatigue.  Gastrointestinal: Positive for abdominal pain, nausea and vomiting.  Psychiatric/Behavioral: Positive for memory loss.    Past Medical History:  Diagnosis Date  . Arthritis    "qwhere"  . Coronary artery disease   . Hypercholesteremia   . Hypertension   . Myocardial infarction (HCC) 2003  . SBO (small bowel obstruction) (HCC) 12/18/2016  . Stenosis of subclavian artery (HCC)   . Type II diabetes mellitus (HCC)     Past Surgical History:  Procedure Laterality Date  . ABDOMINAL HYSTERECTOMY  1970  . BOWEL RESECTION N/A 12/20/2016   Procedure: SMALL BOWEL RESECTION;  Surgeon: Abigail Miyamoto, MD;  Location: [facility] OR;  Service: General;  Laterality: N/A;  . CARDIAC CATHETERIZATION    . CATARACT EXTRACTION Bilateral 2000's  . CORONARY ARTERY BYPASS GRAFT  2003   in [place], [place]  . LAPAROTOMY N/A 12/20/2016   Procedure: EXPLORATORY LAPAROTOMY;  Surgeon: Abigail Miyamoto, MD;  Location: [facility] OR;  Service: General;  Laterality: N/A;  . LEFT HEART CATHETERIZATION WITH CORONARY/GRAFT ANGIOGRAM N/A 09/27/2014   Procedure: LEFT HEART CATHETERIZATION WITH Isabel Caprice;  Surgeon: Pamella Pert, MD;  Location: [facility] CATH LAB;  Service: Cardiovascular;  Laterality: N/A;  . UNILATERAL UPPER EXTREMEITY ANGIOGRAM N/A 09/27/2014   Procedure: SUBCLAVIAN ARTERIOGRAM ;  Surgeon: Pamella Pert, MD;  Location: [facility] CATH LAB;  Service: Cardiovascular;  Laterality: N/A;  . UNILATERAL UPPER EXTREMEITY ANGIOGRAM N/A 11/09/2014   Procedure: Left subclavian stent ;  Surgeon: Yates Decamp, MD;  Location: [facility] CATH LAB;  Service: Cardiovascular;  Laterality: N/A;  . VISCERAL ANGIOGRAM N/A 10/25/2014    Procedure: Laurence Slate;  Surgeon: Yates Decamp, MD;  Location: [facility] CATH LAB;  Service: Cardiovascular;  Laterality: N/A;     reports that she has never smoked. She has never used smokeless tobacco. She reports that she does not drink alcohol and does not use drugs.  Allergies  Allergen Reactions  . Statins Other (See Comments)    Myalgias   . Other     NO FOODS WITH SEEDS  . Crestor [Rosuvastatin] Other (See Comments)    Myalgias  . Lipitor [Atorvastatin] Other (See Comments)    myalgias  . Zocor [Simvastatin] Other (See Comments)    myalgias    Family History  Problem Relation Age of Onset  . Diabetes Father   . Gallbladder disease Neg Hx     Prior to Admission medications   Medication Sig Start Date End Date Taking? Authorizing Provider  alendronate (FOSAMAX) 70 MG tablet Take 70 mg by mouth every Monday.  12/08/14   [provider]  ALPRAZolam Prudy Feeler) 0.5 MG tablet Take 0.5 mg by mouth 3 (three) times daily as needed for anxiety.  10/01/19   [provider]  amLODipine (NORVASC) 10 MG tablet Take 10 mg by mouth at bedtime.  10/12/14   [provider]  aspirin EC 81 MG tablet Take 81 mg by mouth daily.     [provider]  carvedilol (COREG) 6.25 MG tablet Take 12.5 mg by mouth 2 (two) times daily. 10/14/18   [provider]  Cholecalciferol (VITAMIN D3) 50 MCG (2000 UT) TABS Take 1 tablet by mouth daily.    [provider]  diclofenac Sodium (VOLTAREN) 1 % GEL Apply 4 g topically 4 (four) times daily. Patient taking differently: Apply 4 g topically 4 (four) times daily as needed (pain).  10/11/19   Drema Dallas, MD  divalproex (DEPAKOTE) 125 MG DR tablet Take 125 mg by mouth at bedtime. 03/11/20   [provider]  Ferrous Sulfate (IRON PO) Take 1 tablet by mouth daily.    [provider]  gabapentin (NEURONTIN) 300 MG capsule Take 600 mg by mouth at bedtime.  04/09/17   [provider]   hydrALAZINE (APRESOLINE) 100 MG tablet Take 1 tablet (100 mg total) by mouth 3 (three) times daily. 10/11/19   Drema Dallas, MD  lovastatin (MEVACOR) 40 MG tablet Take 1 tablet (40 mg total) by mouth every evening. 08/27/19   Toniann Fail, NP  metFORMIN (GLUCOPHAGE) 500 MG tablet Take 500 mg by mouth at bedtime. 03/30/20   [provider]  ondansetron (ZOFRAN) 4 MG tablet Take 1 tablet (4 mg total) by mouth every 6 (six) hours as needed for nausea. 05/16/20   Pokhrel, Rebekah Chesterfield, MD  sertraline (ZOLOFT) 25 MG tablet Take 25 mg by mouth daily. 03/11/20   [provider]    Physical Exam:  Constitutional: Thin elderly female who is currently resting. Vitals:   07/16/20 0800 07/16/20 0937 07/16/20 1000 07/16/20 1030  BP: 135/87 (!) 168/55 (!) 186/61 (!) 156/72  Pulse: 81 89 87 80  Resp: 16 14  16   Temp:      TempSrc:      SpO2: 99% 92% 98% 98%  Weight:      Height:  Eyes: PERRL, lids and conjunctivae normal ENMT: Mucous membranes are dry. Posterior pharynx clear of any exudate or lesions. Neck: normal, supple, no masses, no thyromegaly Respiratory: clear to auscultation bilaterally, no wheezing, no crackles. Normal respiratory effort. No accessory muscle use.  Cardiovascular: Regular rate and rhythm, no murmurs / rubs / gallops. No extremity edema. 2+ pedal pulses. No carotid bruits.  Abdomen: Tenderness to palpation of the right upper quadrant.  Decreased bowel sounds.   Musculoskeletal: no clubbing / cyanosis. No joint deformity upper and lower extremities. Good ROM, no contractures. Normal muscle tone.  Skin: no rashes, lesions, ulcers. No induration Neurologic: CN 2-12 grossly intact. Sensation intact, DTR normal. Strength 5/5 in all 4.  Psychiatric: Normal judgment and insight. Alert and oriented to self.    Labs on Admission: I have personally reviewed following labs and imaging studies  CBC: Recent Labs  Lab 07/15/20 1719  WBC 8.6  HGB 11.2*   HCT 34.4*  MCV 86.2  PLT 200   Basic Metabolic Panel: Recent Labs  Lab 07/15/20 1719  NA 138  K 3.5  CL 102  CO2 24  GLUCOSE 207*  BUN 20  CREATININE 1.25*  CALCIUM 9.2   GFR: Estimated Creatinine Clearance: 22.7 mL/min (A) (by C-G formula based on SCr of 1.25 mg/dL (H)). Liver Function Tests: Recent Labs  Lab 07/15/20 1719  AST 18  ALT 9  ALKPHOS 58  BILITOT 0.7  PROT 6.6  ALBUMIN 3.6   Recent Labs  Lab 07/15/20 1719  LIPASE 20   No results for input(s): AMMONIA in the last 168 hours. Coagulation Profile: No results for input(s): INR, PROTIME in the last 168 hours. Cardiac Enzymes: No results for input(s): CKTOTAL, CKMB, CKMBINDEX, TROPONINI in the last 168 hours. BNP (last 3 results) No results for input(s): PROBNP in the last 8760 hours. HbA1C: No results for input(s): HGBA1C in the last 72 hours. CBG: No results for input(s): GLUCAP in the last 168 hours. Lipid Profile: No results for input(s): CHOL, HDL, LDLCALC, TRIG, CHOLHDL, LDLDIRECT in the last 72 hours. Thyroid Function Tests: No results for input(s): TSH, T4TOTAL, FREET4, T3FREE, THYROIDAB in the last 72 hours. Anemia Panel: No results for input(s): VITAMINB12, FOLATE, FERRITIN, TIBC, IRON, RETICCTPCT in the last 72 hours. Urine analysis:    Component Value Date/Time   COLORURINE YELLOW 05/13/2020 0824   APPEARANCEUR CLEAR 05/13/2020 0824   LABSPEC 1.023 05/13/2020 0824   PHURINE 6.0 05/13/2020 0824   GLUCOSEU NEGATIVE 05/13/2020 0824   HGBUR NEGATIVE 05/13/2020 0824   BILIRUBINUR NEGATIVE 05/13/2020 0824   KETONESUR 5 (A) 05/13/2020 0824   PROTEINUR >=300 (A) 05/13/2020 0824   UROBILINOGEN 0.2 06/03/2013 1938   NITRITE NEGATIVE 05/13/2020 0824   LEUKOCYTESUR NEGATIVE 05/13/2020 0824   Sepsis Labs: No results found for this or any previous visit (from the past 240 hour(s)).   Radiological Exams on Admission: CT Abdomen Pelvis W Contrast  Result Date: 07/16/2020 CLINICAL DATA:   Acute generalized abdominal pain. EXAM: CT ABDOMEN AND PELVIS WITH CONTRAST TECHNIQUE: Multidetector CT imaging of the abdomen and pelvis was performed using the standard protocol following bolus administration of intravenous contrast. CONTRAST:  54mL OMNIPAQUE IOHEXOL 300 MG/ML  SOLN COMPARISON:  May 13, 2020. FINDINGS: Lower chest: No acute abnormality. Hepatobiliary: Stable large gallstone is noted. No biliary dilatation is noted. The liver is unremarkable. Pancreas: Unremarkable. No pancreatic ductal dilatation or surrounding inflammatory changes. Spleen: Stable splenic cyst. Adrenals/Urinary Tract: Adrenal glands are unremarkable. Kidneys are normal, without renal  calculi, focal lesion, or hydronephrosis. Bladder is unremarkable. Stomach/Bowel: Mild gastric distention is noted as well as proximal small bowel dilatation, concerning for small bowel obstruction. Distal small bowel loops are nondilated. The colon is nondilated. Sigmoid diverticulosis is noted without inflammation. Vascular/Lymphatic: Aortic atherosclerosis. No enlarged abdominal or pelvic lymph nodes. Reproductive: Status post hysterectomy. No adnexal masses. Other: Small amount of free fluid is noted in the pelvis. No hernia is noted. Musculoskeletal: No acute or significant osseous findings. IMPRESSION: 1. Mild gastric distention is noted as well as proximal small bowel dilatation, concerning for small bowel obstruction. This is not significantly changed compared to prior exam. 2. Stable large gallstone. 3. Sigmoid diverticulosis without inflammation. 4. Small amount of free fluid is noted in the pelvis. 5. Aortic atherosclerosis. Aortic Atherosclerosis (ICD10-I70.0). Electronically Signed   By: Lupita RaiderJames  Green Jr Sonya.D.   On: 07/16/2020 08:48   US Abdomen Limited RUQ (LIVER/GB)  Result Date: 07/16/2020 CLINICAL DATA:  Right upper quadrant abdominal pain. EXAM: ULTRASOUND ABDOMEN LIMITED RIGHT UPPER QUADRANT COMPARISON:  Dec 30, 2018.  FINDINGS: Gallbladder: Gallbladder lumen is full of stones with gallbladder wall thickening at 8 mm. No pericholecystic fluid is noted. Positive sonographic Murphy's sign is noted. Common bile duct: Diameter: 3 mm which is within normal limits. Liver: No focal lesion identified. Within normal limits in parenchymal echogenicity. Portal vein is patent on color Doppler imaging with normal direction of blood flow towards the liver. Other: None. IMPRESSION: Cholelithiasis with gallbladder wall thickening and positive sonographic Murphy's sign concerning for acute cholecystitis. Electronically Signed   By: Lupita RaiderJames  Green Jr Sonya.D.   On: 07/16/2020 10:04    EKG: Independently reviewed.  73 bpm with paced rhythm  Assessment/Plan Recurrent small bowel obstruction: Patient presents with complaints of abdominal pain.  She had just recently been hospitalized for small bowel obstruction in October.  CT imaging concerning for small bowel obstruction.  Patient with multiple prior surgeries including remote hysterectomy and more recently laparotomy for small bowel obstruction with resection of bowel in 2018. -Admit to a MedSurg bed -Small bowel order set utilized -N.p.o. -NGT to suction  -Normal saline IV fluids at 75 mL/h -Check gastric occult as gastric fluid appears dark, but no clear blood appreciated -PPI -Antiemetics as needed -Appreciate general surgery consultative services, we will follow-up for further recommendations  Cholelithiasis with suspected acute cholecystitis: Ultrasound concerning for cholelithiasis with gallbladder wall thickening with positive sonographic Murphy sign to suggest acute cholecystitis.  Patient is tender on the right upper quadrant, but liver enzymes appear to be within normal limits. -Rocephin IV in case of underlying -Per general surgery recommendations  Essential hypertension: Blood pressures 135/87-189/59 while in the emergency department.  Home blood pressure medications  include Coreg 12.5 mg twice daily, amlodipine 10 mg daily, and hydralazine 100 mg 3 times daily. -Hydralazine IV as needed  Chronic kidney disease stage IIIb: On admission creatinine appears near her baseline 1.25. -Continue to monitor kidney function  Acute encephalopathy Alzheimer's dementia with behavioral disturbance: Patient noted to be more confused than baseline.  Husband reported patient was getting lost in her home which was unusual. -Continue to monitor  Anxiety: Home medications include Zoloft 25 mg daily and Xanax 0.5 mg 3 times daily as needed for anxiety. -Hold Zoloft as n.p.o. -Substitute Ativan IV as needed for anxiety  Diabetes mellitus type 2: Home medications include Metformin.  Blood sugars initially elevated up to 207. -Hold Metformin -CBGs every 6 hours with very sensitive SSI  GI prophylaxis: Protonix  IV   DVT prophylaxis: lovenox  Code Status: Full( per husband) Family Communication: Discussed plan with the husband over the phone  Disposition Plan: TBD Consults called: Gastroenterology and General surgery Admission status: Inpatient  Clydie Braun MD [facility] Pager [phone]   If 7PM-7AM, please contact night-coverage [email or web address] Password [email or web address]  07/16/2020, 11:08 AM

## 2020-07-17 ENCOUNTER — Other Ambulatory Visit: Payer: Self-pay | Admitting: *Deleted

## 2020-07-17 LAB — COMPREHENSIVE METABOLIC PANEL
ALT: 10 U/L (ref 0–44)
AST: 20 U/L (ref 15–41)
Albumin: 3.3 g/dL — ABNORMAL LOW (ref 3.5–5.0)
Alkaline Phosphatase: 56 U/L (ref 38–126)
Anion gap: 12 (ref 5–15)
BUN: 21 mg/dL (ref 8–23)
CO2: 25 mmol/L (ref 22–32)
Calcium: 8.9 mg/dL (ref 8.9–10.3)
Chloride: 103 mmol/L (ref 98–111)
Creatinine, Ser: 1.19 mg/dL — ABNORMAL HIGH (ref 0.44–1.00)
GFR, Estimated: 43 mL/min — ABNORMAL LOW (ref >60)
Glucose, Bld: 112 mg/dL — ABNORMAL HIGH (ref 70–99)
Potassium: 4 mmol/L (ref 3.5–5.1)
Sodium: 140 mmol/L (ref 135–145)
Total Bilirubin: 0.9 mg/dL (ref 0.3–1.2)
Total Protein: 6.2 g/dL — ABNORMAL LOW (ref 6.5–8.1)

## 2020-07-17 LAB — GLUCOSE, CAPILLARY
Glucose-Capillary: 110 mg/dL — ABNORMAL HIGH (ref 70–99)
Glucose-Capillary: 118 mg/dL — ABNORMAL HIGH (ref 70–99)
Glucose-Capillary: 133 mg/dL — ABNORMAL HIGH (ref 70–99)
Glucose-Capillary: 125 mg/dL — ABNORMAL HIGH (ref 70–99)

## 2020-07-17 LAB — CBC
HCT: 33.2 % — ABNORMAL LOW (ref 36.0–46.0)
Hemoglobin: 10.3 g/dL — ABNORMAL LOW (ref 12.0–15.0)
MCH: 26.3 pg (ref 26.0–34.0)
MCHC: 31 g/dL (ref 30.0–36.0)
MCV: 84.7 fL (ref 80.0–100.0)
Platelets: 142 10*3/uL — ABNORMAL LOW (ref 150–400)
RBC: 3.92 MIL/uL (ref 3.87–5.11)
RDW: 15.9 % — ABNORMAL HIGH (ref 11.5–15.5)
WBC: 9.8 10*3/uL (ref 4.0–10.5)
nRBC: 0 % (ref 0.0–0.2)

## 2020-07-17 MED ORDER — DEXTROSE-NACL 5-0.45 % IV SOLN: INTRAVENOUS | Status: AC

## 2020-07-17 MED ORDER — DM-GUAIFENESIN ER 30-600 MG PO TB12: 1.0000 | ORAL_TABLET | Freq: Two times a day (BID) | ORAL | Status: DC | PRN

## 2020-07-17 NOTE — Patient Outreach (Signed)
[facility]) Care Management  07/17/2020  [place] M Elks 10/09/1925 [id number]   Incoming call received from [facility] daughter in law [name] stating member has been admitted back into the hospital for bowel obstruction.  She is concerned with [name] ability to adequately care for member in the home.  State he is having some problems with his memory (couldn't remember how to get home from the hospital) and overall care of member is weighing on him.  Advised [name] that this care manager will collaborate with hospital liaison and/or inpatient RNCM & CSW to establish a safe discharge plan.  Hospital liaison notified of admission and updated on concern.  This care manager will follow up with member/family pending discharge.  Kemper Durie, [place], MSN [facility] Care Management  [facility] Manager [phone]

## 2020-07-17 NOTE — Consult Note (Signed)
   [facility] Inpatient Consult   07/17/2020  [place] M [place] [birth year] [id number]   Patient is currently active with [facility] for chronic disease management services.  Patient has been engaged by a [facility] CM care coordinator. Per review, patient being followed by [facility] as well in outpatient setting.    Plan: Will follow for progression and disposition plans and update [facility] CM RN of patient progression.  Of note, [facility] Care Management services does not replace or interfere with any services that are arranged by inpatient case management or social work.  Christophe Louis, MSN, RN [facility] Liaison Nurse Mobile Phone [phone]  Toll free office [phone]

## 2020-07-17 NOTE — Progress Notes (Signed)
Patient complaining of nausea and stomach hurting.  NGT reconnected to low intermittent suction per MD notes.

## 2020-07-17 NOTE — Progress Notes (Signed)
Progress Note     Subjective: Patient reports some abdominal pain this AM, able to localize to the middle but not upper or lower. Thinks she may have passed some flatus but not certain. Denies nausea this AM.   Objective: Vital signs in last 24 hours: Temp:  [97.9 F (36.6 C)-98.8 F (37.1 C)] 97.9 F (36.6 C) (12/13 0500) Pulse Rate:  [65-96] 65 (12/13 0500) Resp:  [14-18] 17 (12/13 0500) BP: (103-186)/(48-90) 148/49 (12/13 0500) SpO2:  [95 %-100 %] 98 % (12/13 0500)    Intake/Output from previous day: 12/12 0701 - 12/13 0700 In: 1005.7 [I.V.:905.7; IV Piggyback:100] Out: 975 [Emesis/NG output:975] Intake/Output this shift: No intake/output data recorded.  PE: General: pleasant, WD, cachectic female who is laying in bed in NAD Heart: regular, rate, and rhythm.   Lungs: CTAB, no wheezes, rhonchi, or rales noted.  Respiratory effort nonlabored Abd: soft, NT, ND, NGT present with some thin bilious drainage     Lab Results:  Recent Labs    07/15/20 1719 07/17/20 0102  WBC 8.6 9.8  HGB 11.2* 10.3*  HCT 34.4* 33.2*  PLT 200 142*   BMET Recent Labs    07/15/20 1719 07/17/20 0102  NA 138 140  K 3.5 4.0  CL 102 103  CO2 24 25  GLUCOSE 207* 112*  BUN 20 21  CREATININE 1.25* 1.19*  CALCIUM 9.2 8.9   PT/INR No results for input(s): LABPROT, INR in the last 72 hours. CMP     Component Value Date/Time   NA 140 07/17/2020 0102   K 4.0 07/17/2020 0102   CL 103 07/17/2020 0102   CO2 25 07/17/2020 0102   GLUCOSE 112 (H) 07/17/2020 0102   BUN 21 07/17/2020 0102   CREATININE 1.19 (H) 07/17/2020 0102   CALCIUM 8.9 07/17/2020 0102   PROT 6.2 (L) 07/17/2020 0102   ALBUMIN 3.3 (L) 07/17/2020 0102   AST 20 07/17/2020 0102   ALT 10 07/17/2020 0102   ALKPHOS 56 07/17/2020 0102   BILITOT 0.9 07/17/2020 0102   GFRNONAA 43 (L) 07/17/2020 0102   GFRAA 33 (L) 10/11/2019 0321   Lipase     Component Value Date/Time   LIPASE 20 07/15/2020 1719        Studies/Results: CT Abdomen Pelvis W Contrast  Result Date: 07/16/2020 CLINICAL DATA:  Acute generalized abdominal pain. EXAM: CT ABDOMEN AND PELVIS WITH CONTRAST TECHNIQUE: Multidetector CT imaging of the abdomen and pelvis was performed using the standard protocol following bolus administration of intravenous contrast. CONTRAST:  16mL OMNIPAQUE IOHEXOL 300 MG/ML  SOLN COMPARISON:  May 13, 2020. FINDINGS: Lower chest: No acute abnormality. Hepatobiliary: Stable large gallstone is noted. No biliary dilatation is noted. The liver is unremarkable. Pancreas: Unremarkable. No pancreatic ductal dilatation or surrounding inflammatory changes. Spleen: Stable splenic cyst. Adrenals/Urinary Tract: Adrenal glands are unremarkable. Kidneys are normal, without renal calculi, focal lesion, or hydronephrosis. Bladder is unremarkable. Stomach/Bowel: Mild gastric distention is noted as well as proximal small bowel dilatation, concerning for small bowel obstruction. Distal small bowel loops are nondilated. The colon is nondilated. Sigmoid diverticulosis is noted without inflammation. Vascular/Lymphatic: Aortic atherosclerosis. No enlarged abdominal or pelvic lymph nodes. Reproductive: Status post hysterectomy. No adnexal masses. Other: Small amount of free fluid is noted in the pelvis. No hernia is noted. Musculoskeletal: No acute or significant osseous findings. IMPRESSION: 1. Mild gastric distention is noted as well as proximal small bowel dilatation, concerning for small bowel obstruction. This is not significantly changed compared to prior exam.  2. Stable large gallstone. 3. Sigmoid diverticulosis without inflammation. 4. Small amount of free fluid is noted in the pelvis. 5. Aortic atherosclerosis. Aortic Atherosclerosis (ICD10-I70.0). Electronically Signed   By: Lupita Raider M.D.   On: 07/16/2020 08:48   DG Abd Portable 1V-Small Bowel Obstruction Protocol-initial, 8 hr delay  Result Date:  07/16/2020 CLINICAL DATA:  Small bowel obstruction EXAM: PORTABLE ABDOMEN - 1 VIEW COMPARISON:  07/16/2020 FINDINGS: NG tube is in stable position, looping in the distal stomach with the tip in the fundus. Mild gaseous distention of small bowel loops in the lower abdomen. No free air or organomegaly. IMPRESSION: Mildly dilated lower abdominal small bowel loops, similar to prior study. NG tube remains in the stomach. Electronically Signed   By: Charlett Nose M.D.   On: 07/16/2020 21:20   DG Abd Portable 1 View  Result Date: 07/16/2020 CLINICAL DATA:  NG tube placement EXAM: PORTABLE ABDOMEN - 1 VIEW COMPARISON:  05/14/2020 FINDINGS: NG tube is folded in the distal stomach with the tip redirected to the fundus. Nonobstructive bowel gas pattern. Excreted contrast in the collecting systems of both kidneys and the bladder. Degenerative changes of the spine, and SI joints. Aortoiliac and femoral atherosclerosis present. IMPRESSION: NG tube folded in the distal stomach with the tip in the stomach fundus. Electronically Signed   By: Judie Petit.  Shick M.D.   On: 07/16/2020 11:50   US Abdomen Limited RUQ (LIVER/GB)  Result Date: 07/16/2020 CLINICAL DATA:  Right upper quadrant abdominal pain. EXAM: ULTRASOUND ABDOMEN LIMITED RIGHT UPPER QUADRANT COMPARISON:  Dec 30, 2018. FINDINGS: Gallbladder: Gallbladder lumen is full of stones with gallbladder wall thickening at 8 mm. No pericholecystic fluid is noted. Positive sonographic Murphy's sign is noted. Common bile duct: Diameter: 3 mm which is within normal limits. Liver: No focal lesion identified. Within normal limits in parenchymal echogenicity. Portal vein is patent on color Doppler imaging with normal direction of blood flow towards the liver. Other: None. IMPRESSION: Cholelithiasis with gallbladder wall thickening and positive sonographic Murphy's sign concerning for acute cholecystitis. Electronically Signed   By: Lupita Raider M.D.   On: 07/16/2020 10:04     Anti-infectives: Anti-infectives (From admission, onward)   Start     Dose/Rate Route Frequency Ordered Stop   07/16/20 1130  cefTRIAXone (ROCEPHIN) 2 g in sodium chloride 0.9 % 100 mL IVPB  Status:  Discontinued        2 g 200 mL/hr over 30 Minutes Intravenous Daily 07/16/20 1126 07/17/20 0816       Assessment/Plan T2DM PVD CAD s/p CABG HTN HLD CKD stage III Alzheimer's dementia  Hx of multiple prior abdominal surgeries SBO  - 8h delay film with mildly dilated loops of small intestine but  - patient's abdominal exam benign - trial clamping NGT today and sips of CLD, if this is tolerated well then potentially remove NGT later today or tomorrow - if patient becomes more nauseated or distended, reconnect NGT to LIWS - mobilize as able - recommend keeping K >4.0 and Mg >2.0 to help optimize bowel function   ?possible cholecystitis  - I think cholecystitis is not very likely given no leukocytosis or elevation in LFTs - it is fine to cover with course of abx, this was started by primary team yesterday - I would not recommend surgical intervention in this patient currently, given Alzheimer's dementia and frailty   FEN: Can try NGT clamping today and sips of clears  VTE: lovenox ID: [email or web address] 12/12>>  LOS: 1  day    Juliet Rude , [facility] Surgery 07/17/2020, 9:47 AM Please see Amion for pager number during day hours 7:00am-4:30pm

## 2020-07-17 NOTE — Plan of Care (Signed)
  Problem: Clinical Measurements: Goal: Ability to maintain clinical measurements within normal limits will improve Outcome: Progressing Goal: Diagnostic test results will improve Outcome: Progressing   

## 2020-07-17 NOTE — Progress Notes (Signed)
PROGRESS NOTE    [place] M Blaker  VZC:[id number] DOB: 1925-11-27 DOA: 07/15/2020 PCP: Alysia Penna, MD   Brief Narrative:  86 year old with history of Alzheimer's, DM2, CAD, HTN, previous small bowel obstructions admitted for abdominal pain.  Had SBO in 2018 requiring laparotomy/small bowel resection.  Upon admission she was diagnosed with SBO and possible acute cholecystitis.  General surgery was consulted.   Assessment & Plan:   Principal Problem:   SBO (small bowel obstruction) (HCC) Active Problems:   HTN (hypertension)   DM (diabetes mellitus) (HCC)   CKD (chronic kidney disease), stage III (HCC)   Alzheimer's dementia with behavioral disturbance (HCC)   Cholelithiasis with acute cholecystitis  Recurrent small bowel obstruction:  -Ongoing recurrent issue.  Has had laparotomy in 2018 and also been managed conservatively -N.p.o., NG tube, IV fluids.  Accu-Cheks every 6 hours.  Out of bed to chair whenever possible. -General surgery following  Cholelithiasis with possible acute cholecystitis -Seen by general surgery low suspicion for acute cholecystitis therefore will monitor for now.  Discontinue antibiotics   Essential hypertension -IV hydralazine as needed.  Home meds on hold   Chronic kidney disease stage IIIb -Baseline creatinine around 2.5.   Alzheimer's dementia with behavioral disturbance -Monitor signs for delirium.  Supportive care   Anxiety -Home Zoloft and Xanax on hold   Diabetes mellitus type 2 -Hold Metformin on hold.  Accu-Cheks every 6 hours with sliding scale   GI prophylaxis: Protonix IV      DVT prophylaxis: enoxaparin (LOVENOX) injection 30 mg Start: 07/16/20 1130 SCDs Start: 07/16/20 1118   Code Status: DNR Family Communication:  Called Son.   Status is: Inpatient  Remains inpatient appropriate because:Inpatient level of care appropriate due to severity of illness   Dispo: The patient is from: Home               Anticipated d/c is to: Home              Anticipated d/c date is: 2 days              Patient currently is not medically stable to d/c.  For small bowel obstruction with NG tube in place.  Not stable for discharge until she is able to tolerate oral without any issues   Body mass index is 20.85 kg/m.      Subjective: Doing ok this morning, denies any nausea.     Examination:  Constitutional: Not in acute distress; elderly frail. NGT. Temporal wasting. Cachectic frail Respiratory: Clear to auscultation bilaterally Cardiovascular: Normal sinus rhythm, no rubs Abdomen: diminished Bowel sounds.  Musculoskeletal: No edema noted Skin: No rashes seen Neurologic: CN 2-12 grossly intact.  And nonfocal Psychiatric: Normal judgment and insight. Alert and oriented x 3. Normal mood.      Objective: Vitals:   07/16/20 2042 07/16/20 2256 07/17/20 0100 07/17/20 0500  BP: (!) 171/61 (!) 144/58 (!) 170/62 (!) 148/49  Pulse: 96 78 68 65  Resp: 17  17 17   Temp: 98.6 F (37 C)  98.8 F (37.1 C) 97.9 F (36.6 C)  TempSrc: Oral  Axillary Axillary  SpO2: 98%  98% 98%  Weight:      Height:        Intake/Output Summary (Last 24 hours) at 07/17/2020 0811 Last data filed at 07/17/2020 0300 Gross per 24 hour  Intake 1005.7 ml  Output 975 ml  Net 30.7 ml   Filed Weights   07/15/20 1705  Weight: 51.7 kg  Data Reviewed:   CBC: Recent Labs  Lab 07/15/20 1719 07/17/20 0102  WBC 8.6 9.8  HGB 11.2* 10.3*  HCT 34.4* 33.2*  MCV 86.2 84.7  PLT 200 142*   Basic Metabolic Panel: Recent Labs  Lab 07/15/20 1719 07/17/20 0102  NA 138 140  K 3.5 4.0  CL 102 103  CO2 24 25  GLUCOSE 207* 112*  BUN 20 21  CREATININE 1.25* 1.19*  CALCIUM 9.2 8.9   GFR: Estimated Creatinine Clearance: 23.9 mL/min (A) (by C-G formula based on SCr of 1.19 mg/dL (H)). Liver Function Tests: Recent Labs  Lab 07/15/20 1719 07/17/20 0102  AST 18 20  ALT 9 10  ALKPHOS 58 56  BILITOT 0.7 0.9   PROT 6.6 6.2*  ALBUMIN 3.6 3.3*   Recent Labs  Lab 07/15/20 1719  LIPASE 20   No results for input(s): AMMONIA in the last 168 hours. Coagulation Profile: No results for input(s): INR, PROTIME in the last 168 hours. Cardiac Enzymes: No results for input(s): CKTOTAL, CKMB, CKMBINDEX, TROPONINI in the last 168 hours. BNP (last 3 results) No results for input(s): PROBNP in the last 8760 hours. HbA1C: Recent Labs    07/15/20 1719  HGBA1C 6.0*   CBG: Recent Labs  Lab 07/16/20 1741 07/16/20 2347 07/17/20 0520  GLUCAP 104* 112* 110*   Lipid Profile: No results for input(s): CHOL, HDL, LDLCALC, TRIG, CHOLHDL, LDLDIRECT in the last 72 hours. Thyroid Function Tests: No results for input(s): TSH, T4TOTAL, FREET4, T3FREE, THYROIDAB in the last 72 hours. Anemia Panel: No results for input(s): VITAMINB12, FOLATE, FERRITIN, TIBC, IRON, RETICCTPCT in the last 72 hours. Sepsis Labs: No results for input(s): PROCALCITON, LATICACIDVEN in the last 168 hours.  Recent Results (from the past 240 hour(s))  Resp Panel by RT-PCR (Flu A&B, Covid) Nasopharyngeal Swab     Status: None   Collection Time: 07/16/20  9:11 AM   Specimen: Nasopharyngeal Swab; Nasopharyngeal(NP) swabs in vial transport medium  Result Value Ref Range Status   SARS Coronavirus 2 by RT PCR NEGATIVE NEGATIVE Final    Comment: (NOTE) SARS-CoV-2 target nucleic acids are NOT DETECTED.  The SARS-CoV-2 RNA is generally detectable in upper respiratory specimens during the acute phase of infection. The lowest concentration of SARS-CoV-2 viral copies this assay can detect is 138 copies/mL. A negative result does not preclude SARS-Cov-2 infection and should not be used as the sole basis for treatment or other patient management decisions. A negative result may occur with  improper specimen collection/handling, submission of specimen other than nasopharyngeal swab, presence of viral mutation(s) within the areas targeted by  this assay, and inadequate number of viral copies(<138 copies/mL). A negative result must be combined with clinical observations, patient history, and epidemiological information. The expected result is Negative.  Fact Sheet for Patients:  BloggerCourse.com  Fact Sheet for Healthcare Providers:  SeriousBroker.it  This test is no t yet approved or cleared by the [place] FDA and  has been authorized for detection and/or diagnosis of SARS-CoV-2 by FDA under an Emergency Use Authorization (EUA). This EUA will remain  in effect (meaning this test can be used) for the duration of the COVID-19 declaration under Section 564(b)(1) of the Act, 21 U.S.C.section 360bbb-3(b)(1), unless the authorization is terminated  or revoked sooner.       Influenza A by PCR NEGATIVE NEGATIVE Final   Influenza B by PCR NEGATIVE NEGATIVE Final    Comment: (NOTE) The Xpert Xpress SARS-CoV-2/FLU/RSV plus assay is intended as an aid  in the diagnosis of influenza from Nasopharyngeal swab specimens and should not be used as a sole basis for treatment. Nasal washings and aspirates are unacceptable for Xpert Xpress SARS-CoV-2/FLU/RSV testing.  Fact Sheet for Patients: BloggerCourse.comhttps://www.fda.gov/media/152166/download  Fact Sheet for Healthcare Providers: SeriousBroker.ithttps://www.fda.gov/media/152162/download  This test is not yet approved or cleared by the [place] FDA and has been authorized for detection and/or diagnosis of SARS-CoV-2 by FDA under an Emergency Use Authorization (EUA). This EUA will remain in effect (meaning this test can be used) for the duration of the COVID-19 declaration under Section 564(b)(1) of the Act, 21 U.S.C. section 360bbb-3(b)(1), unless the authorization is terminated or revoked.  Performed at [facility], [address] [place], [place] [address]          Radiology Studies: CT Abdomen Pelvis W Contrast  Result Date:  07/16/2020 CLINICAL DATA:  Acute generalized abdominal pain. EXAM: CT ABDOMEN AND PELVIS WITH CONTRAST TECHNIQUE: Multidetector CT imaging of the abdomen and pelvis was performed using the standard protocol following bolus administration of intravenous contrast. CONTRAST:  80mL OMNIPAQUE IOHEXOL 300 MG/ML  SOLN COMPARISON:  May 13, 2020. FINDINGS: Lower chest: No acute abnormality. Hepatobiliary: Stable large gallstone is noted. No biliary dilatation is noted. The liver is unremarkable. Pancreas: Unremarkable. No pancreatic ductal dilatation or surrounding inflammatory changes. Spleen: Stable splenic cyst. Adrenals/Urinary Tract: Adrenal glands are unremarkable. Kidneys are normal, without renal calculi, focal lesion, or hydronephrosis. Bladder is unremarkable. Stomach/Bowel: Mild gastric distention is noted as well as proximal small bowel dilatation, concerning for small bowel obstruction. Distal small bowel loops are nondilated. The colon is nondilated. Sigmoid diverticulosis is noted without inflammation. Vascular/Lymphatic: Aortic atherosclerosis. No enlarged abdominal or pelvic lymph nodes. Reproductive: Status post hysterectomy. No adnexal masses. Other: Small amount of free fluid is noted in the pelvis. No hernia is noted. Musculoskeletal: No acute or significant osseous findings. IMPRESSION: 1. Mild gastric distention is noted as well as proximal small bowel dilatation, concerning for small bowel obstruction. This is not significantly changed compared to prior exam. 2. Stable large gallstone. 3. Sigmoid diverticulosis without inflammation. 4. Small amount of free fluid is noted in the pelvis. 5. Aortic atherosclerosis. Aortic Atherosclerosis (ICD10-I70.0). Electronically Signed   By: Lupita RaiderJames  Green Jr M.D.   On: 07/16/2020 08:48   DG Abd Portable 1V-Small Bowel Obstruction Protocol-initial, 8 hr delay  Result Date: 07/16/2020 CLINICAL DATA:  Small bowel obstruction EXAM: PORTABLE ABDOMEN - 1 VIEW  COMPARISON:  07/16/2020 FINDINGS: NG tube is in stable position, looping in the distal stomach with the tip in the fundus. Mild gaseous distention of small bowel loops in the lower abdomen. No free air or organomegaly. IMPRESSION: Mildly dilated lower abdominal small bowel loops, similar to prior study. NG tube remains in the stomach. Electronically Signed   By: Charlett NoseKevin  Dover M.D.   On: 07/16/2020 21:20   DG Abd Portable 1 View  Result Date: 07/16/2020 CLINICAL DATA:  NG tube placement EXAM: PORTABLE ABDOMEN - 1 VIEW COMPARISON:  05/14/2020 FINDINGS: NG tube is folded in the distal stomach with the tip redirected to the fundus. Nonobstructive bowel gas pattern. Excreted contrast in the collecting systems of both kidneys and the bladder. Degenerative changes of the spine, and SI joints. Aortoiliac and femoral atherosclerosis present. IMPRESSION: NG tube folded in the distal stomach with the tip in the stomach fundus. Electronically Signed   By: Judie PetitM.  Shick M.D.   On: 07/16/2020 11:50   US Abdomen Limited RUQ (LIVER/GB)  Result Date:  07/16/2020 CLINICAL DATA:  Right upper quadrant abdominal pain. EXAM: ULTRASOUND ABDOMEN LIMITED RIGHT UPPER QUADRANT COMPARISON:  Dec 30, 2018. FINDINGS: Gallbladder: Gallbladder lumen is full of stones with gallbladder wall thickening at 8 mm. No pericholecystic fluid is noted. Positive sonographic Murphy's sign is noted. Common bile duct: Diameter: 3 mm which is within normal limits. Liver: No focal lesion identified. Within normal limits in parenchymal echogenicity. Portal vein is patent on color Doppler imaging with normal direction of blood flow towards the liver. Other: None. IMPRESSION: Cholelithiasis with gallbladder wall thickening and positive sonographic Murphy's sign concerning for acute cholecystitis. Electronically Signed   By: Lupita Raider M.D.   On: 07/16/2020 10:04        Scheduled Meds:  enoxaparin (LOVENOX) injection  30 mg Subcutaneous Daily    insulin aspart  0-6 Units Subcutaneous Q6H   pantoprazole (PROTONIX) IV  40 mg Intravenous Q12H   Continuous Infusions:  cefTRIAXone (ROCEPHIN)  IV Stopped (07/16/20 1247)   dextrose 5 % and 0.45% NaCl       LOS: 1 day   Time spent= 35 mins    Jaishawn Witzke Joline Maxcy, MD [facility]  If 7PM-7AM, please contact night-coverage  07/17/2020, 8:11 AM

## 2020-07-17 NOTE — Progress Notes (Signed)
Unable to complete admission profile d/t altered mental status.

## 2020-07-18 ENCOUNTER — Inpatient Hospital Stay (HOSPITAL_COMMUNITY): Payer: Medicare Other

## 2020-07-18 LAB — BASIC METABOLIC PANEL
Anion gap: 14 (ref 5–15)
BUN: 13 mg/dL (ref 8–23)
CO2: 22 mmol/L (ref 22–32)
Calcium: 9.4 mg/dL (ref 8.9–10.3)
Chloride: 101 mmol/L (ref 98–111)
Creatinine, Ser: 1.11 mg/dL — ABNORMAL HIGH (ref 0.44–1.00)
GFR, Estimated: 47 mL/min — ABNORMAL LOW (ref >60)
Glucose, Bld: 112 mg/dL — ABNORMAL HIGH (ref 70–99)
Potassium: 3.7 mmol/L (ref 3.5–5.1)
Sodium: 137 mmol/L (ref 135–145)

## 2020-07-18 LAB — MAGNESIUM: Magnesium: 1.5 mg/dL — ABNORMAL LOW (ref 1.7–2.4)

## 2020-07-18 LAB — CBC
HCT: 35.5 % — ABNORMAL LOW (ref 36.0–46.0)
Hemoglobin: 11.9 g/dL — ABNORMAL LOW (ref 12.0–15.0)
MCH: 27.4 pg (ref 26.0–34.0)
MCHC: 33.5 g/dL (ref 30.0–36.0)
MCV: 81.6 fL (ref 80.0–100.0)
Platelets: 103 10*3/uL — ABNORMAL LOW (ref 150–400)
RBC: 4.35 MIL/uL (ref 3.87–5.11)
RDW: 15.2 % (ref 11.5–15.5)
WBC: 10.1 10*3/uL (ref 4.0–10.5)
nRBC: 0 % (ref 0.0–0.2)

## 2020-07-18 LAB — GLUCOSE, CAPILLARY
Glucose-Capillary: 115 mg/dL — ABNORMAL HIGH (ref 70–99)
Glucose-Capillary: 115 mg/dL — ABNORMAL HIGH (ref 70–99)
Glucose-Capillary: 125 mg/dL — ABNORMAL HIGH (ref 70–99)
Glucose-Capillary: 97 mg/dL (ref 70–99)

## 2020-07-18 MED ORDER — MAGNESIUM SULFATE 4 GM/100ML IV SOLN
4.0000 g | Freq: Once | INTRAVENOUS | Status: AC
  Administered 2020-07-18: 12:00:00 4 g via INTRAVENOUS
  Filled 2020-07-18: qty 100

## 2020-07-18 MED ORDER — POTASSIUM CHLORIDE 10 MEQ/100ML IV SOLN
10.0000 meq | INTRAVENOUS | Status: AC
  Administered 2020-07-18 (×4): 10 meq via INTRAVENOUS
  Filled 2020-07-18 (×3): qty 100

## 2020-07-18 MED ORDER — HYDRALAZINE HCL 20 MG/ML IJ SOLN: 10.0000 mg | INTRAMUSCULAR | Status: DC | PRN

## 2020-07-18 NOTE — Evaluation (Signed)
Physical Therapy Evaluation Patient Details Name: Sonya Chavez M Strength MRN: [id number] DOB: November 14, 1925 Today's Date: 07/18/2020   History of Present Illness  Pt isa 86 y.o. female admitted 07/15/20 with abdominal pain; workup for SBO, acute cholecystitis. Management by NGT; no plans for sx at this time. PMH includes Alzheimer's dementia, DM2, CAD, HTN, recurrent SBO.    Clinical Impression  Pt presents with an overall decrease in functional mobility secondary to above. Pt pleasantly confused, only oriented to self with h/o dementia; per pt and chart, pt from home with husband and ambulatory with cane. Today, pt moving well with minA for HHA to maintain balance; limited by fatigue and generalized weakness. Pt would benefit from continued acute PT services to maximize functional mobility and independence prior to d/c home if able to have continued 24/7 assist from family. If 24/7 help not available, may require SNF-level therapies.    Follow Up Recommendations Home health PT;Supervision/Assistance - 24 hour    Equipment Recommendations  None recommended by PT    Recommendations for Other Services       Precautions / Restrictions Precautions Precautions: Fall;Other (comment) Precaution Comments: NGT Restrictions Weight Bearing Restrictions: No      Mobility  Bed Mobility Overal bed mobility: Needs Assistance Bed Mobility: Supine to Sit     Supine to sit: Min assist     General bed mobility comments: MinA to initiate movement to EOB with light assist for LE management and trunk elevation    Transfers Overall transfer level: Needs assistance Equipment used: 1 person hand held assist Transfers: Sit to/from Stand Sit to Stand: Min assist         General transfer comment: MinA for HHA to maintain stability upon standing; attempted use of RW, but pt leaving behind  Ambulation/Gait Ambulation/Gait assistance: Min assist Gait Distance (Feet): 40 Feet Assistive device: 1  person hand held assist Gait Pattern/deviations: Step-through pattern;Decreased stride length;Trunk flexed Gait velocity: Decreased   General Gait Details: Slow, mildly unsteady gait with minA for HHA to maintain stability; walking back/forth in room within confines of NGT line; provided RW but pt leaving behind (does not seem like she is used to using one)  Information systems manager [employer] (Stroke Patients Only)       Balance Overall balance assessment: Needs assistance   Sitting balance-Leahy Scale: Fair       Standing balance-Leahy Scale: Fair Standing balance comment: Can static stand without UE support; stability improved with HHA                             Pertinent Vitals/Pain Pain Assessment: No/denies pain    Home Living Family/patient expects to be discharged to:: Private residence Living Arrangements: Spouse/significant other Available Help at Discharge: Family;Available 24 hours/day Type of Home: House Home Access: Stairs to enter Entrance Stairs-Rails: Doctor, general practice of Steps: 3 Home Layout: One level Home Equipment: Walker - 2 wheels;Wheelchair - manual;Cane - single point Additional Comments: Pt with dementia and unable to provide PLOF. Information obtained from recent admission 05/14/20    Prior Function Level of Independence: Needs assistance   Gait / Transfers Assistance Needed: Pt reports ambulatory with cane           Hand Dominance        Extremity/Trunk Assessment   Upper Extremity Assessment Upper Extremity Assessment: Generalized weakness  Lower Extremity Assessment Lower Extremity Assessment: Generalized weakness    Cervical / Trunk Assessment Cervical / Trunk Assessment: Kyphotic  Communication   Communication: No difficulties  Cognition Arousal/Alertness: Awake/alert Behavior During Therapy: WFL for tasks assessed/performed Overall Cognitive Status: History  of cognitive impairments - at baseline                                 General Comments: Alert to self and "[facility]" only; unable to cues month or almost Christmas despite cues. Pleasantly confused, following majority of simple commands with increased cues to attend to task      General Comments      Exercises     Assessment/Plan    PT Assessment Patient needs continued PT services  PT Problem List Decreased strength;Decreased activity tolerance;Decreased balance;Decreased mobility;Decreased cognition;Decreased knowledge of use of DME;Decreased safety awareness;Decreased knowledge of precautions       PT Treatment Interventions DME instruction;Gait training;Stair training;Functional mobility training;Therapeutic activities;Therapeutic exercise;Balance training;Patient/family education    PT Goals (Current goals can be found in the Care Plan section)  Acute Rehab PT Goals Patient Stated Goal: "It feels good to be out of bed" PT Goal Formulation: With patient Time For Goal Achievement: 08/01/20 Potential to Achieve Goals: Good    Frequency Min 3X/week   Barriers to discharge        Co-evaluation               AM-PAC PT "6 Clicks" Mobility  Outcome Measure Help needed turning from your back to your side while in a flat bed without using bedrails?: A Little Help needed moving from lying on your back to sitting on the side of a flat bed without using bedrails?: A Little Help needed moving to and from a bed to a chair (including a wheelchair)?: A Little Help needed standing up from a chair using your arms (e.g., wheelchair or bedside chair)?: A Little Help needed to walk in hospital room?: A Little Help needed climbing 3-5 steps with a railing? : A Little 6 Click Score: 18    End of Session   Activity Tolerance: Patient tolerated treatment well;Patient limited by fatigue Patient left: in chair;with call bell/phone within reach;with chair alarm set;Other  (comment) (with bilateral soft mitts replaced) Nurse Communication: Mobility status PT Visit Diagnosis: Other abnormalities of gait and mobility (R26.89);Muscle weakness (generalized) (M62.81)    Time: 4481-8563 PT Time Calculation (min) (ACUTE ONLY): 19 min   Charges:   PT Evaluation $PT Eval Moderate Complexity: 1 Mod     Ina Homes, PT, DPT Acute Rehabilitation Services  Pager [phone] Office [phone]  Malachy Chamber 07/18/2020, 10:19 AM

## 2020-07-18 NOTE — Progress Notes (Signed)
PROGRESS NOTE    [place] M Yaden  RXV:[id number] DOB: 09-Jun-1926 DOA: 07/15/2020 PCP: Alysia Penna, MD   Brief Narrative:  86 year old with history of Alzheimer's, DM2, CAD, HTN, previous small bowel obstructions admitted for abdominal pain.  Had SBO in 2018 requiring laparotomy/small bowel resection.  Upon admission she was diagnosed with SBO and possible acute cholecystitis.  General surgery was consulted.  Currently managed conservatively.   Assessment & Plan:   Principal Problem:   SBO (small bowel obstruction) (HCC) Active Problems:   HTN (hypertension)   DM (diabetes mellitus) (HCC)   CKD (chronic kidney disease), stage III (HCC)   Alzheimer's dementia with behavioral disturbance (HCC)   Cholelithiasis with acute cholecystitis  Recurrent small bowel obstruction:  -Ongoing recurrent issue.  Has had laparotomy in 2018 and also been managed conservatively.  Clear liquid diet.  NG tube in place.  Accu-Chek every 6 hours.  Out of bed to chair. Persistent abd distention on  -General surgery following -Magnesium and potassium repleted this morning  Cholelithiasis with possible acute cholecystitis -Low suspicion for infection at this time.  Antibiotics are discontinued.   Essential hypertension -IV hydralazine as needed.  Home meds on hold   Chronic kidney disease stage IIIb -Baseline creatinine around 2.5.   Alzheimer's dementia with behavioral disturbance -Monitor signs for delirium.  Supportive care   Anxiety -Home Zoloft and Xanax on hold   Diabetes mellitus type 2 -Hold Metformin on hold.  Accu-Cheks every 6 hours with sliding scale   GI prophylaxis: Protonix IV      DVT prophylaxis: enoxaparin (LOVENOX) injection 30 mg Start: 07/16/20 1130 SCDs Start: 07/16/20 1118  Code Status: DNR Family Communication:  Spoke with [name].   Status is: Inpatient  Remains inpatient appropriate because:Inpatient level of care appropriate due to severity of  illness   Dispo: The patient is from: Home              Anticipated d/c is to: Home              Anticipated d/c date is: 2 days              Patient currently is not medically stable to d/c.  For small bowel obstruction with NG tube in place.  Not stable for discharge until she is able to tolerate oral without any issues   Body mass index is 20.85 kg/m.      Subjective: Sitting up in the chair, no complaints. Denies nausea.   Examination:  Constitutional: Not in acute distress; NGT clammped. Elderly frail.  Respiratory: Clear to auscultation bilaterally Cardiovascular: Normal sinus rhythm, no rubs Abdomen: Nontender nondistended good bowel sounds Musculoskeletal: No edema noted Skin: No rashes seen Neurologic: CN 2-12 grossly intact.  And nonfocal Psychiatric: Normal judgment and insight. Alert and oriented x 3. Normal mood.   Objective: Vitals:   07/17/20 1927 07/17/20 2051 07/17/20 2144 07/18/20 0423  BP: (!) 185/65 (!) 180/64 104/88 (!) 164/75  Pulse: 70   74  Resp:    18  Temp: 98.5 F (36.9 C)   98.8 F (37.1 C)  TempSrc: Oral     SpO2: 98%   100%  Weight:      Height:        Intake/Output Summary (Last 24 hours) at 07/18/2020 0827 Last data filed at 07/17/2020 1833 Gross per 24 hour  Intake 417.06 ml  Output 1000 ml  Net -582.94 ml   Filed Weights   07/15/20 1705  Weight: 51.7  kg     Data Reviewed:   CBC: Recent Labs  Lab 07/15/20 1719 07/17/20 0102 07/18/20 0358  WBC 8.6 9.8 10.1  HGB 11.2* 10.3* 11.9*  HCT 34.4* 33.2* 35.5*  MCV 86.2 84.7 81.6  PLT 200 142* 103*   Basic Metabolic Panel: Recent Labs  Lab 07/15/20 1719 07/17/20 0102 07/18/20 0358  NA 138 140 137  K 3.5 4.0 3.7  CL 102 103 101  CO2 24 25 22   GLUCOSE 207* 112* 112*  BUN 20 21 13   CREATININE 1.25* 1.19* 1.11*  CALCIUM 9.2 8.9 9.4  MG  --   --  1.5*   GFR: Estimated Creatinine Clearance: 25.6 mL/min (A) (by C-G formula based on SCr of 1.11 mg/dL (H)). Liver  Function Tests: Recent Labs  Lab 07/15/20 1719 07/17/20 0102  AST 18 20  ALT 9 10  ALKPHOS 58 56  BILITOT 0.7 0.9  PROT 6.6 6.2*  ALBUMIN 3.6 3.3*   Recent Labs  Lab 07/15/20 1719  LIPASE 20   No results for input(s): AMMONIA in the last 168 hours. Coagulation Profile: No results for input(s): INR, PROTIME in the last 168 hours. Cardiac Enzymes: No results for input(s): CKTOTAL, CKMB, CKMBINDEX, TROPONINI in the last 168 hours. BNP (last 3 results) No results for input(s): PROBNP in the last 8760 hours. HbA1C: Recent Labs    07/15/20 1719  HGBA1C 6.0*   CBG: Recent Labs  Lab 07/17/20 0520 07/17/20 1145 07/17/20 1639 07/17/20 2317 07/18/20 0613  GLUCAP 110* 118* 133* 125* 115*   Lipid Profile: No results for input(s): CHOL, HDL, LDLCALC, TRIG, CHOLHDL, LDLDIRECT in the last 72 hours. Thyroid Function Tests: No results for input(s): TSH, T4TOTAL, FREET4, T3FREE, THYROIDAB in the last 72 hours. Anemia Panel: No results for input(s): VITAMINB12, FOLATE, FERRITIN, TIBC, IRON, RETICCTPCT in the last 72 hours. Sepsis Labs: No results for input(s): PROCALCITON, LATICACIDVEN in the last 168 hours.  Recent Results (from the past 240 hour(s))  Resp Panel by RT-PCR (Flu A&B, Covid) Nasopharyngeal Swab     Status: None   Collection Time: 07/16/20  9:11 AM   Specimen: Nasopharyngeal Swab; Nasopharyngeal(NP) swabs in vial transport medium  Result Value Ref Range Status   SARS Coronavirus 2 by RT PCR NEGATIVE NEGATIVE Final    Comment: (NOTE) SARS-CoV-2 target nucleic acids are NOT DETECTED.  The SARS-CoV-2 RNA is generally detectable in upper respiratory specimens during the acute phase of infection. The lowest concentration of SARS-CoV-2 viral copies this assay can detect is 138 copies/mL. A negative result does not preclude SARS-Cov-2 infection and should not be used as the sole basis for treatment or other patient management decisions. A negative result may occur  with  improper specimen collection/handling, submission of specimen other than nasopharyngeal swab, presence of viral mutation(s) within the areas targeted by this assay, and inadequate number of viral copies(<138 copies/mL). A negative result must be combined with clinical observations, patient history, and epidemiological information. The expected result is Negative.  Fact Sheet for Patients:  07/20/20  Fact Sheet for Healthcare Providers:  14/12/21  This test is no t yet approved or cleared by the [employer] FDA and  has been authorized for detection and/or diagnosis of SARS-CoV-2 by FDA under an Emergency Use Authorization (EUA). This EUA will remain  in effect (meaning this test can be used) for the duration of the COVID-19 declaration under Section 564(b)(1) of the Act, 21 U.S.C.section 360bbb-3(b)(1), unless the authorization is terminated  or revoked sooner.  Influenza A by PCR NEGATIVE NEGATIVE Final   Influenza B by PCR NEGATIVE NEGATIVE Final    Comment: (NOTE) The Xpert Xpress SARS-CoV-2/FLU/RSV plus assay is intended as an aid in the diagnosis of influenza from Nasopharyngeal swab specimens and should not be used as a sole basis for treatment. Nasal washings and aspirates are unacceptable for Xpert Xpress SARS-CoV-2/FLU/RSV testing.  Fact Sheet for Patients: [employer]  Fact Sheet for Healthcare Providers: [employer]it  This test is not yet approved or cleared by the [place] FDA and has been authorized for detection and/or diagnosis of SARS-CoV-2 by FDA under an Emergency Use Authorization (EUA). This EUA will remain in effect (meaning this test can be used) for the duration of the COVID-19 declaration under Section 564(b)(1) of the Act, 21 U.S.C. section 360bbb-3(b)(1), unless the authorization is terminated  or revoked.  Performed at [facility], [address] [place], [place] [address]          Radiology Studies: CT Abdomen Pelvis W Contrast  Result Date: 07/16/2020 CLINICAL DATA:  Acute generalized abdominal pain. EXAM: CT ABDOMEN AND PELVIS WITH CONTRAST TECHNIQUE: Multidetector CT imaging of the abdomen and pelvis was performed using the standard protocol following bolus administration of intravenous contrast. CONTRAST:  9mL OMNIPAQUE IOHEXOL 300 MG/ML  SOLN COMPARISON:  May 13, 2020. FINDINGS: Lower chest: No acute abnormality. Hepatobiliary: Stable large gallstone is noted. No biliary dilatation is noted. The liver is unremarkable. Pancreas: Unremarkable. No pancreatic ductal dilatation or surrounding inflammatory changes. Spleen: Stable splenic cyst. Adrenals/Urinary Tract: Adrenal glands are unremarkable. Kidneys are normal, without renal calculi, focal lesion, or hydronephrosis. Bladder is unremarkable. Stomach/Bowel: Mild gastric distention is noted as well as proximal small bowel dilatation, concerning for small bowel obstruction. Distal small bowel loops are nondilated. The colon is nondilated. Sigmoid diverticulosis is noted without inflammation. Vascular/Lymphatic: Aortic atherosclerosis. No enlarged abdominal or pelvic lymph nodes. Reproductive: Status post hysterectomy. No adnexal masses. Other: Small amount of free fluid is noted in the pelvis. No hernia is noted. Musculoskeletal: No acute or significant osseous findings. IMPRESSION: 1. Mild gastric distention is noted as well as proximal small bowel dilatation, concerning for small bowel obstruction. This is not significantly changed compared to prior exam. 2. Stable large gallstone. 3. Sigmoid diverticulosis without inflammation. 4. Small amount of free fluid is noted in the pelvis. 5. Aortic atherosclerosis. Aortic Atherosclerosis (ICD10-I70.0). Electronically Signed   By: Lupita Raider M.D.   On: 07/16/2020 08:48    DG Abd Portable 1V  Result Date: 07/18/2020 CLINICAL DATA:  Small-bowel obstruction. EXAM: PORTABLE ABDOMEN - 1 VIEW COMPARISON:  07/16/2020. FINDINGS: Prior CABG. Surgical sutures noted over the right abdomen. NG tube noted coiled in the stomach in unchanged position. Air-filled loops of small and large bowel noted. Mild adynamic ileus could present in this fashion. Follow-up exam to demonstrate resolution suggested. No free air. Degenerative change lumbar spine. IMPRESSION: NG tube noted coiled in the stomach in unchanged position. Air-filled loops of small and large bowel noted. Mild adynamic ileus could present in this fashion. Follow-up exam to demonstrate resolution suggested. Electronically Signed   By: Maisie Fus  Register   On: 07/18/2020 07:38   DG Abd Portable 1V-Small Bowel Obstruction Protocol-initial, 8 hr delay  Result Date: 07/16/2020 CLINICAL DATA:  Small bowel obstruction EXAM: PORTABLE ABDOMEN - 1 VIEW COMPARISON:  07/16/2020 FINDINGS: NG tube is in stable position, looping in the distal stomach with the tip in the fundus. Mild gaseous distention of small bowel loops  in the lower abdomen. No free air or organomegaly. IMPRESSION: Mildly dilated lower abdominal small bowel loops, similar to prior study. NG tube remains in the stomach. Electronically Signed   By: Charlett NoseKevin  Dover M.D.   On: 07/16/2020 21:20   DG Abd Portable 1 View  Result Date: 07/16/2020 CLINICAL DATA:  NG tube placement EXAM: PORTABLE ABDOMEN - 1 VIEW COMPARISON:  05/14/2020 FINDINGS: NG tube is folded in the distal stomach with the tip redirected to the fundus. Nonobstructive bowel gas pattern. Excreted contrast in the collecting systems of both kidneys and the bladder. Degenerative changes of the spine, and SI joints. Aortoiliac and femoral atherosclerosis present. IMPRESSION: NG tube folded in the distal stomach with the tip in the stomach fundus. Electronically Signed   By: Judie PetitM.  Shick M.D.   On: 07/16/2020 11:50   US  Abdomen Limited RUQ (LIVER/GB)  Result Date: 07/16/2020 CLINICAL DATA:  Right upper quadrant abdominal pain. EXAM: ULTRASOUND ABDOMEN LIMITED RIGHT UPPER QUADRANT COMPARISON:  Dec 30, 2018. FINDINGS: Gallbladder: Gallbladder lumen is full of stones with gallbladder wall thickening at 8 mm. No pericholecystic fluid is noted. Positive sonographic Murphy's sign is noted. Common bile duct: Diameter: 3 mm which is within normal limits. Liver: No focal lesion identified. Within normal limits in parenchymal echogenicity. Portal vein is patent on color Doppler imaging with normal direction of blood flow towards the liver. Other: None. IMPRESSION: Cholelithiasis with gallbladder wall thickening and positive sonographic Murphy's sign concerning for acute cholecystitis. Electronically Signed   By: Lupita RaiderJames  Green Jr M.D.   On: 07/16/2020 10:04        Scheduled Meds: . enoxaparin (LOVENOX) injection  30 mg Subcutaneous Daily  . insulin aspart  0-6 Units Subcutaneous Q6H  . pantoprazole (PROTONIX) IV  40 mg Intravenous Q12H   Continuous Infusions: . dextrose 5 % and 0.45% NaCl 75 mL/hr at 07/17/20 0925  . magnesium sulfate bolus IVPB    . potassium chloride       LOS: 2 days   Time spent= 35 mins    Ajahni Nay Joline [place]cyhirag Elliot Meldrum, MD [facility]  If 7PM-7AM, please contact night-coverage  07/18/2020, 8:27 AM

## 2020-07-18 NOTE — Evaluation (Signed)
Occupational Therapy Evaluation Patient Details Name: [place] M Springs MRN: [id number] DOB: 1926-01-26 Today's Date: 07/18/2020    History of Present Illness Pt isa 86 y.o. female admitted 07/15/20 with abdominal pain; workup for SBO, acute cholecystitis. Management by NGT; no plans for sx at this time. PMH includes Alzheimer's dementia, DM2, CAD, HTN, recurrent SBO.   Clinical Impression   PTA, pt lives with husband, able to complete ADLs and ambulatory with cane per chart review. Pt with hx of dementia, pleasant and agreeable for activities. Pt limited by deficits in strength, standing balance and endurance. Pt overall Min A for mobility with handheld assist and/or IV pole for support. Pt requires Min A for UB/LB ADLs at this time. Pt with some difficulty following directions for safety during ADLs but anticipate pt to do well in her own environment. Recommend HHOT follow-up. If 24/7 assistance not possible at home, consider postacute rehab services. Plan to progress balance during ADLs and improve endurance during daily tasks.   Follow Up Recommendations  Home health OT;Supervision/Assistance - 24 hour    Equipment Recommendations  3 in 1 bedside commode    Recommendations for Other Services       Precautions / Restrictions Precautions Precautions: Fall;Other (comment) Precaution Comments: NGT Restrictions Weight Bearing Restrictions: No      Mobility Bed Mobility Overal bed mobility: Needs Assistance Bed Mobility: Supine to Sit     Supine to sit: Min assist     General bed mobility comments: received up in chair    Transfers Overall transfer level: Needs assistance Equipment used: 1 person hand held assist Transfers: Sit to/from Stand Sit to Stand: Min assist         General transfer comment: Min A for safety in power up with handheld assist. Use of IV pole for stability when walking, cues for safe sequencing and line mgmt    Balance Overall balance  assessment: Needs assistance   Sitting balance-Leahy Scale: Fair       Standing balance-Leahy Scale: Fair Standing balance comment: Can static stand without UE support; stability improved with HHA                           ADL either performed or assessed with clinical judgement   ADL Overall ADL's : Needs assistance/impaired Eating/Feeding: NPO   Grooming: Min guard;Standing;Brushing hair Grooming Details (indicate cue type and reason): min guard for safety/balance standing at sink Upper Body Bathing: Supervision/ safety;Sitting   Lower Body Bathing: Minimal assistance;Sit to/from stand   Upper Body Dressing : Supervision/safety;Sitting   Lower Body Dressing: Minimal assistance Lower Body Dressing Details (indicate cue type and reason): assistance around B feet, difficulty safely reaching Toilet Transfer: Minimal assistance;Ambulation (handheld assist/IV pole) Toilet Transfer Details (indicate cue type and reason): simulated in room, use of handheld assist and/or IV pole as pt uses cane at baseline. Mild unsteadiness Toileting- Clothing Manipulation and Hygiene: Minimal assistance;Sit to/from stand;Sitting/lateral lean Toileting - Clothing Manipulation Details (indicate cue type and reason): Min A for clothing mgmt, pt reporting need to use bathroom but walked out of bathroom. OT brought BSC to pt requiring cues to transfer/use. ome incontinence in recliner. with cues, pt able to perform anterior peri care     Functional mobility during ADLs: Minimal assistance (handheld assist) General ADL Comments: Pt with mild unsteadiness, reporting fatigue during tasks. Hx of dementia, so cues needed for safety and sequencing effectively     Vision Patient Visual Report:  No change from baseline Vision Assessment?: No apparent visual deficits     Perception     Praxis      Pertinent Vitals/Pain Pain Assessment: Faces Faces Pain Scale: Hurts a little bit Pain Location:  back Pain Descriptors / Indicators: Sore Pain Intervention(s): Repositioned     Hand Dominance Right   Extremity/Trunk Assessment Upper Extremity Assessment Upper Extremity Assessment: Generalized weakness   Lower Extremity Assessment Lower Extremity Assessment: Defer to PT evaluation   Cervical / Trunk Assessment Cervical / Trunk Assessment: Kyphotic   Communication Communication Communication: No difficulties   Cognition Arousal/Alertness: Awake/alert Behavior During Therapy: Flat affect Overall Cognitive Status: History of cognitive impairments - at baseline Area of Impairment: Orientation;Attention;Memory;Following commands;Safety/judgement;Awareness;Problem solving                 Orientation Level: Time;Situation Current Attention Level: Selective Memory: Decreased short-term memory Following Commands: Follows one step commands inconsistently;Follows one step commands with increased time Safety/Judgement: Decreased awareness of deficits;Decreased awareness of safety Awareness: Emergent Problem Solving: Slow processing;Difficulty sequencing;Requires verbal cues General Comments: Hx of dementia. Alert to self, difficulty following commands requiring multimodal cues to increase safety. a bit impulsive with movements, difficulty redirecting. while standing at sink, pt reported need to use bathroom - attempting to walk out of bathroom and OT difficulty redirecting to toilet in bathroom   General Comments  Pt reporting low back pain initially, repositioned below behind pt with improvement. Pt says "thank you" throughout session.    Exercises     Shoulder Instructions      Home Living Family/patient expects to be discharged to:: Private residence Living Arrangements: Spouse/significant other Available Help at Discharge: Family;Available 24 hours/day Type of Home: House Home Access: Stairs to enter [employer] of Steps: 3 Entrance Stairs-Rails:  Right;Left Home Layout: One level     Bathroom Shower/Tub: Producer, television/film/video: Handicapped height Bathroom Accessibility: Yes   Home Equipment: Environmental consultant - 2 wheels;Wheelchair - manual;Cane - single point   Additional Comments: Pt with dementia and unable to provide PLOF. Information obtained from recent admission 05/14/20      Prior Functioning/Environment Level of Independence: Needs assistance  Gait / Transfers Assistance Needed: Pt reports ambulatory with cane ADL's / Homemaking Assistance Needed: Per chart, pt able to complete ADLs            OT Problem List: Decreased strength;Decreased activity tolerance;Impaired balance (sitting and/or standing);Decreased safety awareness;Decreased cognition;Decreased knowledge of use of DME or AE      OT Treatment/Interventions: Self-care/ADL training;Therapeutic exercise;Energy conservation;DME and/or AE instruction;Therapeutic activities;Patient/family education    OT Goals(Current goals can be found in the care plan section) Acute Rehab OT Goals Patient Stated Goal: to use the bathroom, get tube out of nose OT Goal Formulation: With patient Time For Goal Achievement: 08/01/20 Potential to Achieve Goals: Good ADL Goals Pt Will Perform Grooming: with modified independence;standing Pt Will Perform Lower Body Dressing: with modified independence;sit to/from stand;sitting/lateral leans Pt Will Transfer to Toilet: with supervision;ambulating;regular height toilet  OT Frequency: Min 2X/week   Barriers to D/C:            Co-evaluation              AM-PAC OT "6 Clicks" Daily Activity     Outcome Measure Help from another person eating meals?: Total (npo) Help from another person taking care of personal grooming?: A Little Help from another person toileting, which includes using toliet, bedpan, or urinal?: A Little Help from another  person bathing (including washing, rinsing, drying)?: A Little Help from another  person to put on and taking off regular upper body clothing?: A Little Help from another person to put on and taking off regular lower body clothing?: A Little 6 Click Score: 16   End of Session Equipment Utilized During Treatment: Gait belt  Activity Tolerance: Patient tolerated treatment well Patient left: in chair;with call bell/phone within reach;with chair alarm set;Other (comment) (with NT assessing vitals)  OT Visit Diagnosis: Unsteadiness on feet (R26.81);Other abnormalities of gait and mobility (R26.89);Muscle weakness (generalized) (M62.81)                Time: 4287-6811 OT Time Calculation (min): 24 min Charges:  OT General Charges $OT Visit: 1 Visit OT Evaluation $OT Eval Moderate Complexity: 1 Mod OT Treatments $Self Care/Home Management : 8-22 mins  Lorre Munroe, OTR/L  Lorre Munroe 07/18/2020, 12:28 PM

## 2020-07-18 NOTE — Plan of Care (Signed)
  Problem: Clinical Measurements: Goal: Ability to maintain clinical measurements within normal limits will improve Outcome: Progressing Goal: Diagnostic test results will improve Outcome: Progressing   

## 2020-07-18 NOTE — Progress Notes (Signed)
Subjective/Chief Complaint: PT with no BMs overnight NGT on sx   Objective: Vital signs in last 24 hours: Temp:  [97.8 F (36.6 C)-98.8 F (37.1 C)] 98.8 F (37.1 C) (12/14 0423) Pulse Rate:  [70-74] 74 (12/14 0423) Resp:  [18] 18 (12/14 0423) BP: (104-185)/(64-88) 164/75 (12/14 0423) SpO2:  [98 %-100 %] 100 % (12/14 0423)    Intake/Output from previous day: 12/13 0701 - 12/14 0700 In: 417.1 [I.V.:417.1] Out: 1000 [Urine:1000] Intake/Output this shift: No intake/output data recorded. PE:  Constitutional: No acute distress, conversant, appears states age. Eyes: Anicteric sclerae, moist conjunctiva, no lid lag Lungs: Clear to auscultation bilaterally, normal respiratory effort CV: regular rate and rhythm, no murmurs, no peripheral edema, pedal pulses 2+ GI: Soft, no masses or hepatosplenomegaly, non-tender to palpation, non distended Skin: No rashes, palpation reveals normal turgor Psychiatric: appropriate judgment and insight, oriented to person, place, and time   Lab Results:  Recent Labs    07/17/20 0102 07/18/20 0358  WBC 9.8 10.1  HGB 10.3* 11.9*  HCT 33.2* 35.5*  PLT 142* 103*   BMET Recent Labs    07/17/20 0102 07/18/20 0358  NA 140 137  K 4.0 3.7  CL 103 101  CO2 25 22  GLUCOSE 112* 112*  BUN 21 13  CREATININE 1.19* 1.11*  CALCIUM 8.9 9.4   PT/INR No results for input(s): LABPROT, INR in the last 72 hours. ABG No results for input(s): PHART, HCO3 in the last 72 hours.  Invalid input(s): PCO2, PO2  Studies/Results: CT Abdomen Pelvis W Contrast  Result Date: 07/16/2020 CLINICAL DATA:  Acute generalized abdominal pain. EXAM: CT ABDOMEN AND PELVIS WITH CONTRAST TECHNIQUE: Multidetector CT imaging of the abdomen and pelvis was performed using the standard protocol following bolus administration of intravenous contrast. CONTRAST:  6mL OMNIPAQUE IOHEXOL 300 MG/ML  SOLN COMPARISON:  May 13, 2020. FINDINGS: Lower chest: No acute abnormality.  Hepatobiliary: Stable large gallstone is noted. No biliary dilatation is noted. The liver is unremarkable. Pancreas: Unremarkable. No pancreatic ductal dilatation or surrounding inflammatory changes. Spleen: Stable splenic cyst. Adrenals/Urinary Tract: Adrenal glands are unremarkable. Kidneys are normal, without renal calculi, focal lesion, or hydronephrosis. Bladder is unremarkable. Stomach/Bowel: Mild gastric distention is noted as well as proximal small bowel dilatation, concerning for small bowel obstruction. Distal small bowel loops are nondilated. The colon is nondilated. Sigmoid diverticulosis is noted without inflammation. Vascular/Lymphatic: Aortic atherosclerosis. No enlarged abdominal or pelvic lymph nodes. Reproductive: Status post hysterectomy. No adnexal masses. Other: Small amount of free fluid is noted in the pelvis. No hernia is noted. Musculoskeletal: No acute or significant osseous findings. IMPRESSION: 1. Mild gastric distention is noted as well as proximal small bowel dilatation, concerning for small bowel obstruction. This is not significantly changed compared to prior exam. 2. Stable large gallstone. 3. Sigmoid diverticulosis without inflammation. 4. Small amount of free fluid is noted in the pelvis. 5. Aortic atherosclerosis. Aortic Atherosclerosis (ICD10-I70.0). Electronically Signed   By: Lupita Raider M.D.   On: 07/16/2020 08:48   DG Abd Portable 1V  Result Date: 07/18/2020 CLINICAL DATA:  Small-bowel obstruction. EXAM: PORTABLE ABDOMEN - 1 VIEW COMPARISON:  07/16/2020. FINDINGS: Prior CABG. Surgical sutures noted over the right abdomen. NG tube noted coiled in the stomach in unchanged position. Air-filled loops of small and large bowel noted. Mild adynamic ileus could present in this fashion. Follow-up exam to demonstrate resolution suggested. No free air. Degenerative change lumbar spine. IMPRESSION: NG tube noted coiled in the stomach in unchanged  position. Air-filled loops of  small and large bowel noted. Mild adynamic ileus could present in this fashion. Follow-up exam to demonstrate resolution suggested. Electronically Signed   By: Maisie Fus  Register   On: 07/18/2020 07:38   DG Abd Portable 1V-Small Bowel Obstruction Protocol-initial, 8 hr delay  Result Date: 07/16/2020 CLINICAL DATA:  Small bowel obstruction EXAM: PORTABLE ABDOMEN - 1 VIEW COMPARISON:  07/16/2020 FINDINGS: NG tube is in stable position, looping in the distal stomach with the tip in the fundus. Mild gaseous distention of small bowel loops in the lower abdomen. No free air or organomegaly. IMPRESSION: Mildly dilated lower abdominal small bowel loops, similar to prior study. NG tube remains in the stomach. Electronically Signed   By: Charlett Nose M.D.   On: 07/16/2020 21:20   DG Abd Portable 1 View  Result Date: 07/16/2020 CLINICAL DATA:  NG tube placement EXAM: PORTABLE ABDOMEN - 1 VIEW COMPARISON:  05/14/2020 FINDINGS: NG tube is folded in the distal stomach with the tip redirected to the fundus. Nonobstructive bowel gas pattern. Excreted contrast in the collecting systems of both kidneys and the bladder. Degenerative changes of the spine, and SI joints. Aortoiliac and femoral atherosclerosis present. IMPRESSION: NG tube folded in the distal stomach with the tip in the stomach fundus. Electronically Signed   By: Judie Petit.  Shick M.D.   On: 07/16/2020 11:50   US Abdomen Limited RUQ (LIVER/GB)  Result Date: 07/16/2020 CLINICAL DATA:  Right upper quadrant abdominal pain. EXAM: ULTRASOUND ABDOMEN LIMITED RIGHT UPPER QUADRANT COMPARISON:  Dec 30, 2018. FINDINGS: Gallbladder: Gallbladder lumen is full of stones with gallbladder wall thickening at 8 mm. No pericholecystic fluid is noted. Positive sonographic Murphy's sign is noted. Common bile duct: Diameter: 3 mm which is within normal limits. Liver: No focal lesion identified. Within normal limits in parenchymal echogenicity. Portal vein is patent on color Doppler  imaging with normal direction of blood flow towards the liver. Other: None. IMPRESSION: Cholelithiasis with gallbladder wall thickening and positive sonographic Murphy's sign concerning for acute cholecystitis. Electronically Signed   By: Lupita Raider M.D.   On: 07/16/2020 10:04    Anti-infectives: Anti-infectives (From admission, onward)   Start     Dose/Rate Route Frequency Ordered Stop   07/16/20 1130  cefTRIAXone (ROCEPHIN) 2 g in sodium chloride 0.9 % 100 mL IVPB  Status:  Discontinued        2 g 200 mL/hr over 30 Minutes Intravenous Daily 07/16/20 1126 07/17/20 0816      Assessment/Plan: T2DM PVD CAD s/p CABG HTN HLD CKD stage III Alzheimer's dementia  Hx of multiple prior abdominal surgeries SBO  - Xray today with no concerns for SBO - clamping NGT today and CLD, if this is tolerated well then potentially remove NGT later today or tomorrow - if patient becomes more nauseated or distended, reconnect NGT to LIWS - mobilize as able - recommend keeping K >4.0 and Mg >2.0 to help optimize bowel function   ?possible cholecystitis  - I think cholecystitis is not very likely given no leukocytosis or elevation in LFTs - it is fine to cover with course of abx, this was started by primary team yesterday - I would not recommend surgical intervention in this patient currently, given Alzheimer's dementia and frailty   FEN: Can try NGT clamping today and sips of clears  VTE: lovenox ID: [email or web address] 12/12>>   LOS: 2 days    Axel Filler 07/18/2020

## 2020-07-19 ENCOUNTER — Encounter (HOSPITAL_COMMUNITY): Payer: Self-pay | Admitting: Internal Medicine

## 2020-07-19 ENCOUNTER — Inpatient Hospital Stay (HOSPITAL_COMMUNITY): Payer: Medicare Other

## 2020-07-19 LAB — CBC
HCT: 35.6 % — ABNORMAL LOW (ref 36.0–46.0)
Hemoglobin: 11.2 g/dL — ABNORMAL LOW (ref 12.0–15.0)
MCH: 26.7 pg (ref 26.0–34.0)
MCHC: 31.5 g/dL (ref 30.0–36.0)
MCV: 84.8 fL (ref 80.0–100.0)
Platelets: 136 10*3/uL — ABNORMAL LOW (ref 150–400)
RBC: 4.2 MIL/uL (ref 3.87–5.11)
RDW: 15.1 % (ref 11.5–15.5)
WBC: 8.7 10*3/uL (ref 4.0–10.5)
nRBC: 0 % (ref 0.0–0.2)

## 2020-07-19 LAB — MAGNESIUM: Magnesium: 2.9 mg/dL — ABNORMAL HIGH (ref 1.7–2.4)

## 2020-07-19 LAB — BASIC METABOLIC PANEL
Anion gap: 15 (ref 5–15)
BUN: 18 mg/dL (ref 8–23)
CO2: 23 mmol/L (ref 22–32)
Calcium: 9.1 mg/dL (ref 8.9–10.3)
Chloride: 99 mmol/L (ref 98–111)
Creatinine, Ser: 1.39 mg/dL — ABNORMAL HIGH (ref 0.44–1.00)
GFR, Estimated: 36 mL/min — ABNORMAL LOW (ref >60)
Glucose, Bld: 98 mg/dL (ref 70–99)
Potassium: 3.8 mmol/L (ref 3.5–5.1)
Sodium: 137 mmol/L (ref 135–145)

## 2020-07-19 LAB — GLUCOSE, CAPILLARY
Glucose-Capillary: 118 mg/dL — ABNORMAL HIGH (ref 70–99)
Glucose-Capillary: 214 mg/dL — ABNORMAL HIGH (ref 70–99)
Glucose-Capillary: 93 mg/dL (ref 70–99)

## 2020-07-19 LAB — OCCULT BLOOD GASTRIC / DUODENUM (SPECIMEN CUP): Occult Blood, Gastric: POSITIVE — AB

## 2020-07-19 MED ORDER — SERTRALINE HCL 25 MG PO TABS
25.0000 mg | ORAL_TABLET | Freq: Every day | ORAL | Status: DC
  Administered 2020-07-20: 10:00:00 25 mg via ORAL
  Filled 2020-07-19: qty 1

## 2020-07-19 MED ORDER — CARVEDILOL 12.5 MG PO TABS
12.5000 mg | ORAL_TABLET | Freq: Two times a day (BID) | ORAL | Status: DC
  Administered 2020-07-19 – 2020-07-20 (×2): 12.5 mg via ORAL
  Filled 2020-07-19 (×2): qty 1

## 2020-07-19 MED ORDER — ASPIRIN EC 81 MG PO TBEC
81.0000 mg | DELAYED_RELEASE_TABLET | Freq: Every day | ORAL | Status: DC
  Administered 2020-07-20: 10:00:00 81 mg via ORAL
  Filled 2020-07-19: qty 1

## 2020-07-19 MED ORDER — BISACODYL 10 MG RE SUPP
10.0000 mg | Freq: Once | RECTAL | Status: AC
  Administered 2020-07-19: 10:00:00 10 mg via RECTAL
  Filled 2020-07-19: qty 1

## 2020-07-19 MED ORDER — DIVALPROEX SODIUM 125 MG PO DR TAB
125.0000 mg | DELAYED_RELEASE_TABLET | Freq: Every day | ORAL | Status: DC
  Administered 2020-07-19: 21:00:00 125 mg via ORAL
  Filled 2020-07-19 (×2): qty 1

## 2020-07-19 MED ORDER — ALPRAZOLAM 0.5 MG PO TABS: 0.5000 mg | ORAL_TABLET | Freq: Three times a day (TID) | ORAL | Status: DC | PRN

## 2020-07-19 MED ORDER — GABAPENTIN 300 MG PO CAPS
600.0000 mg | ORAL_CAPSULE | Freq: Every day | ORAL | Status: DC
  Administered 2020-07-19: 21:00:00 600 mg via ORAL
  Filled 2020-07-19: qty 2

## 2020-07-19 MED ORDER — VITAMIN D3 25 MCG (1000 UNIT) PO TABS
1000.0000 [IU] | ORAL_TABLET | Freq: Every day | ORAL | Status: DC
  Administered 2020-07-20: 10:00:00 1000 [IU] via ORAL
  Filled 2020-07-19 (×2): qty 1

## 2020-07-19 MED ORDER — AMLODIPINE BESYLATE 10 MG PO TABS
10.0000 mg | ORAL_TABLET | Freq: Every day | ORAL | Status: DC
  Administered 2020-07-19: 21:00:00 10 mg via ORAL
  Filled 2020-07-19: qty 1

## 2020-07-19 MED ORDER — PRAVASTATIN SODIUM 10 MG PO TABS
10.0000 mg | ORAL_TABLET | Freq: Every day | ORAL | Status: DC
  Filled 2020-07-19 (×2): qty 1

## 2020-07-19 NOTE — TOC Initial Note (Signed)
Transition of Care [facility]) - Initial/Assessment Note    Patient Details  Name: Sonya Chavez MRN: [id number] Date of Birth: 31-May-1926  Transition of Care [facility]) CM/SW Contact:    Curlene Labrum, RN Phone Number: 07/19/2020, 3:05 PM  Clinical Narrative:                 Case management met with the patient and husband at the bedside regarding transition of care to home.  The patient lives at home with her husband and is supported through [facility] RN at home but currently does not have home health services at this time.  The patient will need PT/OT/aide/ MSW at the home to assist the husband and no needed dme at this time.   The patient's husband was offered Medicare choice regarding home health services and did not have a preference.  I called Tommi Rumps, RNCM with [facility] and the patient was set up for home health PT/OT/aide/ MSW.  The patient ambulated with PT today and currently does not have a NG tube since it was removed today.  The patient husband states that she has all needed dme at home.  Will continue to monitor the patient for discharge needs to home.  Expected Discharge Plan: [place] Barriers to Discharge: Continued Medical Work up   Patient Goals and CMS Choice Patient states their goals for this hospitalization and ongoing recovery are:: Patient's husband looks forward to taking the patient home with home health services. CMS Medicare.gov Compare Post Acute Care list provided to:: Patient Represenative (must comment) (Patient's husband)    Expected Discharge Plan and Services Expected Discharge Plan: [place]   Discharge Planning Services: CM Consult Post Acute Care Choice: [place] arrangements for the past 2 months: [place]: PT,OT,Nurse's [place] Work [employer] Agency: [place] Date [place]: 07/19/20 Time [place]:  1501 Representative spoke with at [place]: Tommi Rumps, [place] with [place]  Prior Living Arrangements/Services Living arrangements for the past 2 months: [place] Lives with:: Spouse Patient language and need for interpreter reviewed:: Yes Do you feel safe going back to the place where you live?: Yes      Need for Family Participation in Patient Care: Yes (Comment) Care giver support system in place?: Yes (comment) Current home services: DME [facility] RN) Criminal Activity/Legal Involvement Pertinent to Current Situation/Hospitalization: No - Comment as needed  Activities of Daily Living Home Assistive Devices/Equipment: None ADL Screening (condition at time of admission) Patient's cognitive ability adequate to safely complete daily activities?: No Is the patient deaf or have difficulty hearing?: Yes Does the patient have difficulty seeing, even when wearing glasses/contacts?: No Does the patient have difficulty concentrating, remembering, or making decisions?: Yes Patient able to express need for assistance with ADLs?: Yes Does the patient have difficulty dressing or bathing?: Yes Independently performs ADLs?: No Communication: Needs assistance Dressing (OT): Needs assistance Grooming: Needs assistance Feeding: Needs assistance Bathing: Needs assistance Toileting: Needs assistance In/Out Bed: Needs assistance Walks in Home: Needs assistance Does the patient have difficulty walking or climbing stairs?: Yes Weakness of Legs: Both Weakness of Arms/Hands: None  Permission Sought/Granted Permission sought to share information with : Case Manager Permission granted to share information with : Yes, Verbal Permission Granted     Permission  granted to share info w AGENCY: [place] granted to share info w Relationship: spouse     Emotional Assessment Appearance:: Appears stated age Attitude/Demeanor/Rapport: Engaged Affect (typically observed):  Accepting Orientation: : Oriented to Self,Fluctuating Orientation (Suspected and/or reported [place]) Alcohol / Substance Use: Not Applicable Psych Involvement: No (comment)  Admission diagnosis:  Small bowel obstruction (HCC) [K56.609] SBO (small bowel obstruction) ([place]) [K56.609] RUQ pain [R10.11] Patient Active Problem List   Diagnosis Date Noted  . Cholelithiasis with acute cholecystitis 07/16/2020  . S/P right hip fracture 05/14/2020  . DNR (do not resuscitate) 10/07/2019  . Alzheimer's dementia with behavioral disturbance ([place])   . Palliative care encounter   . Failure to thrive (0-17) 10/06/2019  . Weakness generalized 10/05/2019  . Hypernatremia 07/18/2019  . Hypokalemia 07/18/2019  . Elevated troponin 07/17/2019  . Acute lower UTI 07/17/2019  . Positive D dimer 07/17/2019  . Normocytic anemia 07/17/2019  . Cholelithiasis 01/02/2019  . CKD (chronic kidney disease), stage III ([place]) 10/21/2017  . Delirium due to another medical condition 12/18/2016  . SBO (small bowel obstruction) ([place]) 12/18/2016  . 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 Leukocytosis 12/18/2016  . Peripheral artery disease ([place]) 11/08/2014  . PAD (peripheral artery disease) ([place]) 10/24/2014  . LBBB (left bundle branch block) 09/27/2014    Class: Chronic  . Subclavian artery stenosis, left ([place]) 09/27/2014  . Numbness and tingling in left hand 03/08/2013  . HTN (hypertension) 03/08/2013  . DM (diabetes mellitus) ([place]) 03/08/2013  . CAD (coronary artery disease) 03/08/2013   PCP:  Velna Hatchet, MD Pharmacy:   [facility] [address]), [place] - [address] [place] ([place]) [place] [address] Phone: [phone] Fax: [phone]     Social Determinants of Health (SDOH) Interventions    Readmission Risk Interventions Readmission Risk Prevention Plan 07/19/2020  Transportation Screening Complete  PCP or Specialist Appt within 5-7 Days Complete  Home Care  Screening Complete  Medication Review (RN CM) Complete  Some recent data might be hidden

## 2020-07-19 NOTE — Progress Notes (Signed)
Physical Therapy Treatment Patient Details Name: Sonya Chavez MRN: [id number] DOB: Dec 26, 1925 Today's Date: 07/19/2020    History of Present Illness Pt isa 86 y.o. female admitted 07/15/20 with abdominal pain; workup for SBO, acute cholecystitis. Management by NGT; no plans for sx at this time. PMH includes Alzheimer's dementia, DM2, CAD, HTN, recurrent SBO.    PT Comments    Pt supine on arrival, pleasantly confused but participatory and agreeable to therapy session, spouse also present and encouraging. Pt making progress with mobility, but continues to need increased time to initiate and perform mobility tasks 2/2 cognition and deconditioning. Pt performed bed mobility and transfers with up to minA. Pt progressed gait distance to ~60ft using cane and min guard to minA. HR elevated up to 119 bpm and pt returned to bed, SpO2 WNL on RA and pt reports 5/10 modified RPE (fatigue) at end of session. Pt continues to benefit from PT services to progress toward functional mobility goals. D/C recs below, pending progress.  Follow Up Recommendations  Home health PT;Supervision/Assistance - 24 hour     Equipment Recommendations  None recommended by PT    Recommendations for Other Services       Precautions / Restrictions Precautions Precautions: Fall;Other (comment) Precaution Comments: NGT Restrictions Weight Bearing Restrictions: No    Mobility  Bed Mobility Overal bed mobility: Needs Assistance Bed Mobility: Supine to Sit;Sit to Supine     Supine to sit: Min assist Sit to supine: Min guard   General bed mobility comments: increased time to perform, needs tactile cues to initiate  Transfers Overall transfer level: Needs assistance Equipment used: Straight cane Transfers: Sit to/from Stand Sit to Stand: Min assist         General transfer comment: use of cane upon standing, minA for stability  Ambulation/Gait Ambulation/Gait assistance: Min assist;Min guard Gait  Distance (Feet): 90 Feet Assistive device: 1 person hand held assist;Straight cane Gait Pattern/deviations: Step-through pattern;Decreased stride length;Trunk flexed Gait velocity: Decreased Gait velocity interpretation: <1.31 ft/sec, indicative of household ambulator General Gait Details: pt able to use SPC for improved stability with gait trial however pt at times reaching for staff hand to steady her and furniture in room although no overt LOB observed; min guard mostly   Stairs             Wheelchair Mobility    Modified Rankin (Stroke Patients Only)       Balance Overall balance assessment: Needs assistance   Sitting balance-Leahy Scale: Fair       Standing balance-Leahy Scale: Fair Standing balance comment: Can static stand without UE support; stability improved with HHA vs SPC                            Cognition Arousal/Alertness: Awake/alert Behavior During Therapy: WFL for tasks assessed/performed Overall Cognitive Status: History of cognitive impairments - at baseline Area of Impairment: Orientation;Attention;Memory;Following commands;Safety/judgement;Awareness;Problem solving                 Orientation Level: Place;Time;Situation;Disoriented to Current Attention Level: Sustained Memory: Decreased short-term memory Following Commands: Follows one step commands with increased time;Follows one step commands inconsistently Safety/Judgement: Decreased awareness of deficits;Decreased awareness of safety Awareness: Emergent Problem Solving: Slow processing;Decreased initiation;Difficulty sequencing;Requires verbal cues;Requires tactile cues General Comments: Hx of dementia. Alert to self and spouse only. Pt with food in room and motivated to eat at end of session, RN notified pt may need assist with self-feeding due  to cognitive deficit. Follows majority of 1-step commands but at times needs reorientation to task/repetition of cues due to slow  processing/cognition      Exercises      General Comments General comments (skin integrity, edema, etc.): pt bed wet upon staff arrival to room despite purewick, bed pad changed and NT notified pt will need new purewick; mesh underwear and pad changed; RN notified pt food tray has arrived and pt requesting assistance to eat at end of session; bed in chair position with alarm on for safety and spouse present      Pertinent Vitals/Pain Pain Assessment: No/denies pain Faces Pain Scale: No hurt Pain Intervention(s): Monitored during session;Repositioned    Home Living                      Prior Function            PT Goals (current goals can now be found in the care plan section) Acute Rehab PT Goals Patient Stated Goal: to go home PT Goal Formulation: With patient Time For Goal Achievement: 08/01/20 Potential to Achieve Goals: Good Progress towards PT goals: Progressing toward goals    Frequency    Min 3X/week      PT Plan Current plan remains appropriate    Co-evaluation              AM-PAC PT "6 Clicks" Mobility   Outcome Measure  Help needed turning from your back to your side while in a flat bed without using bedrails?: A Little Help needed moving from lying on your back to sitting on the side of a flat bed without using bedrails?: A Little Help needed moving to and from a bed to a chair (including a wheelchair)?: A Little Help needed standing up from a chair using your arms (e.g., wheelchair or bedside chair)?: A Little Help needed to walk in hospital room?: A Little Help needed climbing 3-5 steps with a railing? : A Little 6 Click Score: 18    End of Session Equipment Utilized During Treatment: Gait belt Activity Tolerance: Patient tolerated treatment well Patient left: in bed;with call bell/phone within reach;with bed alarm set;with family/visitor present (bed in chair position) Nurse Communication: Mobility status PT Visit Diagnosis: Other  abnormalities of gait and mobility (R26.89);Muscle weakness (generalized) (M62.81)     Time: 5397-6734 PT Time Calculation (min) (ACUTE ONLY): 24 min  Charges:  $Gait Training: 8-22 mins $Therapeutic Activity: 8-22 mins                     Lanaya Bennis P., PTA Acute Rehabilitation Services Pager: [phone] Office: [phone]   Angus [place]ms 07/19/2020, 1:38 PM

## 2020-07-19 NOTE — Progress Notes (Signed)
PROGRESS NOTE    [place] [name]  PIR:[id number] DOB: 1926-05-30 DOA: 07/15/2020 PCP: Alysia Penna, MD    Brief Narrative:  Sonya Chavez was admitted to the hospital with a working diagnosis of recurrent small bowel obstruction.  86 year old female past medical history for Alzheimer's dementia, type II [place] mellitus, coronary artery disease, hypertension and history of small bowel obstruction.  Presents with 2-3 days of abdominal pain, right upper quadrant, with positive nausea and vomiting.  On her initial physical examination blood pressure 135/87, heart rate 81, respiratory rate 16, oxygen saturation 92%, she had dry mucous membranes, lungs clear to auscultation bilaterally, heart S1-S2, present and rhythmic, abdomen tender to palpation at the right upper quadrant, decreased bowel sounds, no lower extremity edema.  CT of the abdomen and pelvis with mild gastric distention, proximal small bowel dilatation, concerning for small bowel obstruction.  Positive large gallstone, sigmoid diverticulosis.  Patient was placed on supportive medical therapy, including nasogastric tube to suction, surgery was consulted.  Assessment & Plan:   Principal Problem:   SBO (small bowel obstruction) (HCC) Active Problems:   HTN (hypertension)   DM (diabetes mellitus) (HCC)   CKD (chronic kidney disease), stage III (HCC)   Alzheimer's dementia with behavioral disturbance (HCC)   Cholelithiasis with acute cholecystitis   1. Recurrent small bowel obstruction. Patient feeling better, NG tube has been removed with good toleration, no nausea or vomiting. Abdominal film improved, personally reviewed.  Diet has been advanced.  Continue to encourage out of bed to chair, PT and OT.   2. Cholelithiasis. No clinical signs of cholecystitis (ruled out). Continue to monitor intake.   3. HTN. Blood pressure has been stable, continue close monitoring. Resume carvedilol and amlodipine. Hold on hydralazine  for now.   4. CKD stage 3b. Renal function stable with serum cr at 1.39, K at 3,8 and bicarbonate at 23.   5. T2DM/dyslipidemia continue glucose control with insulin sliding scale, diet has been advanced now that small bowel obstruction has improved. Fating glucose this am 98.  Continue with pravastatin.   6. Alzheimer's dementia.  Patient with no agitation, continue with alprazolam and sertraline.    Status is: Inpatient  Remains inpatient appropriate because:Inpatient level of care appropriate due to severity of illness   Dispo: The patient is from: Home              Anticipated d/c is to: Home              Anticipated d/c date is: 1 day              Patient currently is not medically stable to d/c. Possible dc home in am, if continue clinical improvement,    DVT prophylaxis: Enoxaparin   Code Status:   dnr   Family Communication:  I spoke with patient's husband at the bedside, we talked in detail about patient's condition, plan of care and prognosis and all questions were addressed.      Consultants:   Surgery    Subjective: Patient is feeling better, no nausea or vomiting, no abdominal pain, NG tube has been removed with good toleration.   Objective: Vitals:   07/18/20 1343 07/18/20 2020 07/19/20 0456 07/19/20 0801  BP: (!) 153/72 (!) 185/66 (!) 189/72 (!) 178/70  Pulse: 72 67 88 71  Resp: 18 16 16 18   Temp: 98.1 F (36.7 C) 98.7 F (37.1 C) 98 F (36.7 C) 98.1 F (36.7 C)  TempSrc: Oral  Oral Oral  SpO2:  100% 98% 98% 98%  Weight:      Height:        Intake/Output Summary (Last 24 hours) at 07/19/2020 1526 Last data filed at 07/19/2020 0300 Gross per 24 hour  Intake 339.97 ml  Output --  Net 339.97 ml   Filed Weights   07/15/20 1705  Weight: 51.7 kg    Examination:   General: Not in pain or dyspnea, deconditioned  Neurology: Awake and alert, non focal  E ENT: no pallor, no icterus, oral mucosa moist Cardiovascular: No JVD. S1-S2 present,  rhythmic, no gallops, rubs, or murmurs. No lower extremity edema. Pulmonary: positive breath sounds bilaterally, with no wheezing, rhonchi or rales. Gastrointestinal. Abdomen soft and non tender Skin. No rashes Musculoskeletal: no joint deformities     Data Reviewed: I have personally reviewed following labs and imaging studies  CBC: Recent Labs  Lab 07/15/20 1719 07/17/20 0102 07/18/20 0358 07/19/20 0415  WBC 8.6 9.8 10.1 8.7  HGB 11.2* 10.3* 11.9* 11.2*  HCT 34.4* 33.2* 35.5* 35.6*  MCV 86.2 84.7 81.6 84.8  PLT 200 142* 103* 136*   Basic Metabolic Panel: Recent Labs  Lab 07/15/20 1719 07/17/20 0102 07/18/20 0358 07/19/20 0415  NA 138 140 137 137  K 3.5 4.0 3.7 3.8  CL 102 103 101 99  CO2 24 25 22 23   GLUCOSE 207* 112* 112* 98  BUN 20 21 13 18   CREATININE 1.25* 1.19* 1.11* 1.39*  CALCIUM 9.2 8.9 9.4 9.1  MG  --   --  1.5* 2.9*   GFR: Estimated Creatinine Clearance: 20.4 mL/min (A) (by C-G formula based on SCr of 1.39 mg/dL (H)). Liver Function Tests: Recent Labs  Lab 07/15/20 1719 07/17/20 0102  AST 18 20  ALT 9 10  ALKPHOS 58 56  BILITOT 0.7 0.9  PROT 6.6 6.2*  ALBUMIN 3.6 3.3*   Recent Labs  Lab 07/15/20 1719  LIPASE 20   No results for input(s): AMMONIA in the last 168 hours. Coagulation Profile: No results for input(s): INR, PROTIME in the last 168 hours. Cardiac Enzymes: No results for input(s): CKTOTAL, CKMB, CKMBINDEX, TROPONINI in the last 168 hours. BNP (last 3 results) No results for input(s): PROBNP in the last 8760 hours. HbA1C: No results for input(s): HGBA1C in the last 72 hours. CBG: Recent Labs  Lab 07/18/20 0613 07/18/20 1142 07/18/20 1709 07/18/20 2355 07/19/20 1123  GLUCAP 115* 125* 97 115* 93   Lipid Profile: No results for input(s): CHOL, HDL, LDLCALC, TRIG, CHOLHDL, LDLDIRECT in the last 72 hours. Thyroid Function Tests: No results for input(s): TSH, T4TOTAL, FREET4, T3FREE, THYROIDAB in the last 72  hours. Anemia Panel: No results for input(s): VITAMINB12, FOLATE, FERRITIN, TIBC, IRON, RETICCTPCT in the last 72 hours.    Radiology Studies: I have reviewed all of the imaging during this hospital visit personally     Scheduled Meds: . enoxaparin (LOVENOX) injection  30 mg Subcutaneous Daily  . insulin aspart  0-6 Units Subcutaneous Q6H  . pantoprazole (PROTONIX) IV  40 mg Intravenous Q12H   Continuous Infusions:   LOS: 3 days        Sonya Chavez 07/20/20, MD

## 2020-07-19 NOTE — Plan of Care (Signed)
Patient is alert, in no acute distress.Ng-tube clamped at the beginning of the shift, patient c/o nausea and was connected to Low suction but with minimal output. Clamped again at 0330. Positive bowel sounds, verbalized passing gas.  Problem: Clinical Measurements: Goal: Ability to maintain clinical measurements within normal limits will improve 07/19/2020 0351 by Ruby Cola, RN Outcome: Progressing 07/19/2020 0347 by Ruby Cola, RN Outcome: Progressing Goal: Diagnostic test results will improve 07/19/2020 0351 by Ruby Cola, RN Outcome: Progressing 07/19/2020 0347 by Ruby Cola, RN Outcome: Progressing

## 2020-07-19 NOTE — Plan of Care (Signed)
  Problem: Clinical Measurements: Goal: Ability to maintain clinical measurements within normal limits will improve Outcome: Progressing   

## 2020-07-19 NOTE — Progress Notes (Addendum)
Progress Note     Subjective: Patient denies abdominal pain this AM. States she feels "a little sick" but when asked about nausea she denies. She denies feeling bloated. She denies flatus or BM, nothing recorded in chart either. She reports she took in some CLD yesterday and did not have worsened pain or nausea with this.   Objective: Vital signs in last 24 hours: Temp:  [97.9 F (36.6 C)-98.7 F (37.1 C)] 98.1 F (36.7 C) (12/15 0801) Pulse Rate:  [67-88] 71 (12/15 0801) Resp:  [16-18] 18 (12/15 0801) BP: (153-189)/(66-74) 178/70 (12/15 0801) SpO2:  [98 %-100 %] 98 % (12/15 0801)    Intake/Output from previous day: 12/14 0701 - 12/15 0700 In: 340 [IV Piggyback:340] Out: -  Intake/Output this shift: No intake/output data recorded.  PE: General: pleasant, WD, cachectic female who is laying in bed in NAD Heart: regular, rate, and rhythm.   Lungs: CTAB, no wheezes, rhonchi, or rales noted.  Respiratory effort nonlabored Abd: soft, NT, ND, NGT clamped   Lab Results:  Recent Labs    07/18/20 0358 07/19/20 0415  WBC 10.1 8.7  HGB 11.9* 11.2*  HCT 35.5* 35.6*  PLT 103* 136*   BMET Recent Labs    07/18/20 0358 07/19/20 0415  NA 137 137  K 3.7 3.8  CL 101 99  CO2 22 23  GLUCOSE 112* 98  BUN 13 18  CREATININE 1.11* 1.39*  CALCIUM 9.4 9.1   PT/INR No results for input(s): LABPROT, INR in the last 72 hours. CMP     Component Value Date/Time   NA 137 07/19/2020 0415   K 3.8 07/19/2020 0415   CL 99 07/19/2020 0415   CO2 23 07/19/2020 0415   GLUCOSE 98 07/19/2020 0415   BUN 18 07/19/2020 0415   CREATININE 1.39 (H) 07/19/2020 0415   CALCIUM 9.1 07/19/2020 0415   PROT 6.2 (L) 07/17/2020 0102   ALBUMIN 3.3 (L) 07/17/2020 0102   AST 20 07/17/2020 0102   ALT 10 07/17/2020 0102   ALKPHOS 56 07/17/2020 0102   BILITOT 0.9 07/17/2020 0102   GFRNONAA 36 (L) 07/19/2020 0415   GFRAA 33 (L) 10/11/2019 0321   Lipase     Component Value Date/Time   LIPASE 20  07/15/2020 1719       Studies/Results: DG Abd Portable 1V  Result Date: 07/18/2020 CLINICAL DATA:  Small-bowel obstruction. EXAM: PORTABLE ABDOMEN - 1 VIEW COMPARISON:  07/16/2020. FINDINGS: Prior CABG. Surgical sutures noted over the right abdomen. NG tube noted coiled in the stomach in unchanged position. Air-filled loops of small and large bowel noted. Mild adynamic ileus could present in this fashion. Follow-up exam to demonstrate resolution suggested. No free air. Degenerative change lumbar spine. IMPRESSION: NG tube noted coiled in the stomach in unchanged position. Air-filled loops of small and large bowel noted. Mild adynamic ileus could present in this fashion. Follow-up exam to demonstrate resolution suggested. Electronically Signed   By: Maisie Fus  Register   On: 07/18/2020 07:38    Anti-infectives: Anti-infectives (From admission, onward)   Start     Dose/Rate Route Frequency Ordered Stop   07/16/20 1130  cefTRIAXone (ROCEPHIN) 2 g in sodium chloride 0.9 % 100 mL IVPB  Status:  Discontinued        2 g 200 mL/hr over 30 Minutes Intravenous Daily 07/16/20 1126 07/17/20 0816       Assessment/Plan T2DM PVD CAD s/p CABG HTN HLD CKD stage III Alzheimer's dementia  Hx of multiple prior abdominal surgeries  SBO - KUB yesterday with more of ileus type picture - repeat today  - seems to be tolerating clamping and CLD but now bowel function yet - abdominal exam benign, advance to FLD with NGT clamped and possibly remove NGT this afternoon pending film  - if patient becomes more nauseated or distended, reconnect NGT to LIWS - mobilize as able - recommend keeping K >4.0 and Mg >2.0 to help optimize bowel function  ?possible cholecystitis  -I think cholecystitis is not very likely given no leukocytosis or elevation in LFTs - abx stopped by primary team and patient remains afeb without leukocytosis  FEN:FLD with NGT clamped VTE: lovenox ID: [email or web address] 12/12   Addendum  8938: KUB without concern for SBO or ileus. NGT removed.    LOS: 3 days    Juliet Rude , [facility] 07/19/2020, 8:37 AM Please see Amion for pager number during day hours 7:00am-4:30pm

## 2020-07-20 LAB — GLUCOSE, CAPILLARY
Glucose-Capillary: 122 mg/dL — ABNORMAL HIGH (ref 70–99)
Glucose-Capillary: 218 mg/dL — ABNORMAL HIGH (ref 70–99)

## 2020-07-20 MED ORDER — DOCUSATE SODIUM 100 MG PO CAPS
100.0000 mg | ORAL_CAPSULE | Freq: Two times a day (BID) | ORAL | Status: DC
  Administered 2020-07-20: 10:00:00 100 mg via ORAL
  Filled 2020-07-20: qty 1

## 2020-07-20 MED ORDER — POLYETHYLENE GLYCOL 3350 17 G PO PACK: 17.0000 g | PACK | Freq: Every day | ORAL | Status: DC | PRN

## 2020-07-20 MED ORDER — BOOST / RESOURCE BREEZE PO LIQD CUSTOM
1.0000 | Freq: Three times a day (TID) | ORAL | Status: DC
  Administered 2020-07-20: 10:00:00 1 via ORAL

## 2020-07-20 MED ORDER — POLYETHYLENE GLYCOL 3350 17 G PO PACK: 17.0000 g | PACK | Freq: Every day | ORAL | 0 refills | Status: AC | PRN

## 2020-07-20 NOTE — Progress Notes (Signed)
Pt is feeling a lot better. Tolerating soft diet. Denies abd pain, no nausea. Discharge instructions given to pt's husband. Discharged to home.

## 2020-07-20 NOTE — Consult Note (Signed)
   [facility] Inpatient Consult   07/20/2020  [place] M Biondolillo [birth year] [id number]    Follow up: [facility] RNCM   Call placed to [facility] to leave a message for Lawson Fiscal that patient had transitioned home and requesting a follow up visit.  Will update [facility] RN Care Coordinator.  Charlesetta Shanks, RN BSN CCM [facility]  [phone] business mobile phone Toll free office [phone]  Fax number: [phone] [email or web address]

## 2020-07-20 NOTE — Discharge Summary (Addendum)
Physician Discharge Summary  [place] M Tine KWI:[id number] DOB: 1925/10/17 DOA: 07/15/2020  PCP: Alysia Penna, MD  Admit date: 07/15/2020 Discharge date: 07/20/2020  Admitted From: Home  Disposition:  Home   Recommendations for Outpatient Follow-up and new medication changes:  1. Follow up with Dr. Lorin Picket in 7 days.  2. Added as needed Miralax for constipation.   Home Health: yes   Equipment/Devices: 3 in 1 bedside comode    Discharge Condition: stable  CODE STATUS: dnr   Diet recommendation: soft diet and advance as tolerated.   Brief/Interim Summary: Sonya Chavez was admitted to the hospital with a working diagnosis of recurrent small bowel obstruction.  86 year old female past medical history for Alzheimer's dementia, type II diabetes mellitus, coronary artery disease, hypertension and history of small bowel obstruction.  Presents with 2-3 days of abdominal pain, right upper quadrant, with positive nausea and vomiting.  On her initial physical examination blood pressure 135/87, heart rate 81, respiratory rate 16, oxygen saturation 92%, she had dry mucous membranes, lungs clear to auscultation bilaterally, heart S1-S2, present and rhythmic, abdomen tender to palpation at the right upper quadrant, decreased bowel sounds, no lower extremity edema. Sodium 138, potassium 3.5, chloride 102, bicarb 24, glucose 207, BUN 20, creatinine 1.25, white count 8.6, hemoglobin 11.2, hematocrit 34.4, platelets 200.  SARS COVID-19 negative.  Toxicology screen negative.  EKG 73 bpm, right axis deviation, left bundle branch block, prolonged QTC 539, sinus rhythm, positive LVH, J-point elevation V1-V2, T wave inversions V5-V6.  CT of the abdomen and pelvis with mild gastric distention, proximal small bowel dilatation, concerning for small bowel obstruction.  Positive large gallstone, sigmoid diverticulosis.  Patient was placed on supportive medical therapy, including nasogastric tube to  suction, surgery was consulted.  Patient symptoms improved, nasogastric tube was removed and diet was advanced with good toleration.  1.  Recurrent small bowel obstruction.  Patient responded well to supportive medical therapy, including nasogastric tube for decompression. She received intravenous fluids, antiemetics and analgesics.  Close radiographic surveillance of small bowel obstruction, general surgery consultation. Her symptoms have improved, currently she has no nasogastric tube and she is tolerating p.o. diet adequately.  2.  Cholelithiasis.  Positive gallstones but no clinical signs of cholecystitis.  Follow-up as an outpatient.  3.  Hypertension/hypertensive cardiomyopathy, prolonged QTC..  Initially her antihypertensive agents were held to prevent hypotension, at discharge she will resume carvedilol, amlodipine and hydralazine.  Close follow-up as an outpatient, avoid QT prolonging agents.  4.  Chronic kidney disease stage IIIb.  Her kidney function remained stable, at discharge creatinine 1.39, potassium 3.8 and bicarbonate 23. Close follow-up of kidney function as an outpatient.  5.  Type II diabetes mellitus, dyslipidemia.  Patient was placed on insulin sliding scale for glucose coverage monitoring, Metformin was held during her hospitalization..  Her glucose remained well controlled. Continue lovastatin.   At discharge will resume Metformin.  6.  Alzheimer's dementia.  No agitation or confusion, initially her psychotropic agents were held due to small bowel obstruction, now she has been resumed on alprazolam, divalproex and sertraline.  7. Iron deficiency anemia. Continue oral iron supplementation with fe sulfate, her hgb and hct remained stable, discharge hgb 11.2 and hct 35,6.    Discharge Diagnoses:  Principal Problem:   SBO (small bowel obstruction) (HCC) Active Problems:   HTN (hypertension)   DM (diabetes mellitus) (HCC)   CKD (chronic kidney disease), stage  III (HCC)   Alzheimer's dementia with behavioral disturbance (HCC)   Cholelithiasis with  acute cholecystitis    Discharge Instructions   Allergies as of 07/20/2020      Reactions   Statins Other (See Comments)   Myalgias   Other    NO FOODS WITH SEEDS   Crestor [rosuvastatin] Other (See Comments)   Myalgias   Lipitor [atorvastatin] Other (See Comments)   myalgias   Zocor [simvastatin] Other (See Comments)   myalgias      Medication List    STOP taking these medications   ondansetron 4 MG tablet Commonly known as: ZOFRAN     TAKE these medications   alendronate 70 MG tablet Commonly known as: FOSAMAX Take 70 mg by mouth every Monday.   ALPRAZolam 0.5 MG tablet Commonly known as: XANAX Take 0.5 mg by mouth 3 (three) times daily as needed for anxiety.   amLODipine 10 MG tablet Commonly known as: NORVASC Take 10 mg by mouth at bedtime.   aspirin EC 81 MG tablet Take 81 mg by mouth daily.   carvedilol 6.25 MG tablet Commonly known as: COREG Take 12.5 mg by mouth 2 (two) times daily.   diclofenac Sodium 1 % Gel Commonly known as: VOLTAREN Apply 4 g topically 4 (four) times daily. What changed:   when to take this  reasons to take this   divalproex 125 MG DR tablet Commonly known as: DEPAKOTE Take 125 mg by mouth at bedtime.   gabapentin 300 MG capsule Commonly known as: NEURONTIN Take 600 mg by mouth at bedtime.   hydrALAZINE 100 MG tablet Commonly known as: APRESOLINE Take 1 tablet (100 mg total) by mouth 3 (three) times daily.   IRON PO Take 1 tablet by mouth daily.   lovastatin 40 MG tablet Commonly known as: MEVACOR Take 1 tablet (40 mg total) by mouth every evening.   metFORMIN 500 MG tablet Commonly known as: GLUCOPHAGE Take 500 mg by mouth at bedtime.   polyethylene glycol 17 g packet Commonly known as: MIRALAX / GLYCOLAX Take 17 g by mouth daily as needed for mild constipation.   sertraline 25 MG tablet Commonly known as:  ZOLOFT Take 25 mg by mouth daily.   triamcinolone 0.1 % Commonly known as: KENALOG 2-3 times a day   Vitamin D3 50 MCG (2000 UT) Tabs Take 1 tablet by mouth daily.       Follow-up Information    [facility] Follow up.   Specialty: Home Health Services Why: [facility] will be providing a physical therapist, occupational therapist, medical social worker and home health aide.  They will call you in the next 24-48 hours to set up home health visits after you are discharged to home. Contact information: [address] [place] [address] [phone]        Alysia Penna, MD. Schedule an appointment as soon as possible for a visit.   Specialty: Internal Medicine Why: Please call and make an appointment in the next 7-10 days to follow up with your primary care physician. Contact information: [address] [place] [address] [phone]              Allergies  Allergen Reactions  . Statins Other (See Comments)    Myalgias   . Other     NO FOODS WITH SEEDS  . Crestor [Rosuvastatin] Other (See Comments)    Myalgias  . Lipitor [Atorvastatin] Other (See Comments)    myalgias  . Zocor [Simvastatin] Other (See Comments)    myalgias    Consultations:  General  surgery    Procedures/Studies: CT Abdomen Pelvis W Contrast  Result Date: 07/16/2020 CLINICAL DATA:  Acute generalized abdominal pain. EXAM: CT ABDOMEN AND PELVIS WITH CONTRAST TECHNIQUE: Multidetector CT imaging of the abdomen and pelvis was performed using the standard protocol following bolus administration of intravenous contrast. CONTRAST:  80mL OMNIPAQUE IOHEXOL 300 MG/ML  SOLN COMPARISON:  May 13, 2020. FINDINGS: Lower chest: No acute abnormality. Hepatobiliary: Stable large gallstone is noted. No biliary dilatation is noted. The liver is unremarkable. Pancreas: Unremarkable. No pancreatic ductal dilatation or surrounding inflammatory changes. Spleen: Stable  splenic cyst. Adrenals/Urinary Tract: Adrenal glands are unremarkable. Kidneys are normal, without renal calculi, focal lesion, or hydronephrosis. Bladder is unremarkable. Stomach/Bowel: Mild gastric distention is noted as well as proximal small bowel dilatation, concerning for small bowel obstruction. Distal small bowel loops are nondilated. The colon is nondilated. Sigmoid diverticulosis is noted without inflammation. Vascular/Lymphatic: Aortic atherosclerosis. No enlarged abdominal or pelvic lymph nodes. Reproductive: Status post hysterectomy. No adnexal masses. Other: Small amount of free fluid is noted in the pelvis. No hernia is noted. Musculoskeletal: No acute or significant osseous findings. IMPRESSION: 1. Mild gastric distention is noted as well as proximal small bowel dilatation, concerning for small bowel obstruction. This is not significantly changed compared to prior exam. 2. Stable large gallstone. 3. Sigmoid diverticulosis without inflammation. 4. Small amount of free fluid is noted in the pelvis. 5. Aortic atherosclerosis. Aortic Atherosclerosis (ICD10-I70.0). Electronically Signed   By: Lupita RaiderJames  Green Jr M.D.   On: 07/16/2020 08:48   DG Abd Portable 1V  Result Date: 07/19/2020 CLINICAL DATA:  Ileus. EXAM: PORTABLE ABDOMEN - 1 VIEW COMPARISON:  July 18, 2020. FINDINGS: The bowel gas pattern is normal. Nasogastric tube is seen looped in stomach with distal tip in proximal stomach. No radio-opaque calculi or other significant radiographic abnormality are seen. IMPRESSION: No evidence of bowel obstruction or ileus. Nasogastric tube tip is seen in proximal stomach. Electronically Signed   By: Lupita RaiderJames  Green Jr M.D.   On: 07/19/2020 09:17   DG Abd Portable 1V  Result Date: 07/18/2020 CLINICAL DATA:  Small-bowel obstruction. EXAM: PORTABLE ABDOMEN - 1 VIEW COMPARISON:  07/16/2020. FINDINGS: Prior CABG. Surgical sutures noted over the right abdomen. NG tube noted coiled in the stomach in unchanged  position. Air-filled loops of small and large bowel noted. Mild adynamic ileus could present in this fashion. Follow-up exam to demonstrate resolution suggested. No free air. Degenerative change lumbar spine. IMPRESSION: NG tube noted coiled in the stomach in unchanged position. Air-filled loops of small and large bowel noted. Mild adynamic ileus could present in this fashion. Follow-up exam to demonstrate resolution suggested. Electronically Signed   By: Maisie Fushomas  Register   On: 07/18/2020 07:38   DG Abd Portable 1V-Small Bowel Obstruction Protocol-initial, 8 hr delay  Result Date: 07/16/2020 CLINICAL DATA:  Small bowel obstruction EXAM: PORTABLE ABDOMEN - 1 VIEW COMPARISON:  07/16/2020 FINDINGS: NG tube is in stable position, looping in the distal stomach with the tip in the fundus. Mild gaseous distention of small bowel loops in the lower abdomen. No free air or organomegaly. IMPRESSION: Mildly dilated lower abdominal small bowel loops, similar to prior study. NG tube remains in the stomach. Electronically Signed   By: Charlett NoseKevin  Dover M.D.   On: 07/16/2020 21:20   DG Abd Portable 1 View  Result Date: 07/16/2020 CLINICAL DATA:  NG tube placement EXAM: PORTABLE ABDOMEN - 1 VIEW COMPARISON:  05/14/2020 FINDINGS: NG tube is folded in the distal stomach with the  tip redirected to the fundus. Nonobstructive bowel gas pattern. Excreted contrast in the collecting systems of both kidneys and the bladder. Degenerative changes of the spine, and SI joints. Aortoiliac and femoral atherosclerosis present. IMPRESSION: NG tube folded in the distal stomach with the tip in the stomach fundus. Electronically Signed   By: Judie Petit.  Shick M.D.   On: 07/16/2020 11:50   US Abdomen Limited RUQ (LIVER/GB)  Result Date: 07/16/2020 CLINICAL DATA:  Right upper quadrant abdominal pain. EXAM: ULTRASOUND ABDOMEN LIMITED RIGHT UPPER QUADRANT COMPARISON:  Dec 30, 2018. FINDINGS: Gallbladder: Gallbladder lumen is full of stones with  gallbladder wall thickening at 8 mm. No pericholecystic fluid is noted. Positive sonographic Murphy's sign is noted. Common bile duct: Diameter: 3 mm which is within normal limits. Liver: No focal lesion identified. Within normal limits in parenchymal echogenicity. Portal vein is patent on color Doppler imaging with normal direction of blood flow towards the liver. Other: None. IMPRESSION: Cholelithiasis with gallbladder wall thickening and positive sonographic Murphy's sign concerning for acute cholecystitis. Electronically Signed   By: Lupita Raider M.D.   On: 07/16/2020 10:04       Subjective: Patient is feeling better, no nausea or vomiting, no dyspnea or chest pain.   Discharge Exam: Vitals:   07/19/20 1923 07/20/20 0300  BP: (!) 145/59 (!) 138/55  Pulse: 75 71  Resp: 16 16  Temp: 98.2 F (36.8 C) 98 F (36.7 C)  SpO2: 99% 94%   Vitals:   07/19/20 0456 07/19/20 0801 07/19/20 1923 07/20/20 0300  BP: (!) 189/72 (!) 178/70 (!) 145/59 (!) 138/55  Pulse: 88 71 75 71  Resp: Temp: 98 F (36.7 C) 98.1 F (36.7 C) 98.2 F (36.8 C) 98 F (36.7 C)  TempSrc: Oral Oral Oral Oral  SpO2: 98% 98% 99% 94%  Weight:      Height:        General: Not in pain or dyspnea.  Neurology: Awake and alert, non focal  E ENT: mild pallor, no icterus, oral mucosa moist Cardiovascular: No JVD. S1-S2 present, rhythmic, no gallops, rubs, or murmurs. No lower extremity edema. Pulmonary: positive breath sounds bilaterally, with no wheezing, rhonchi or rales. Gastrointestinal. Abdomen soft and non tender Skin. No rashes Musculoskeletal: no joint deformities   The results of significant diagnostics from this hospitalization (including imaging, microbiology, ancillary and laboratory) are listed below for reference.     Microbiology: Recent Results (from the past 240 hour(s))  Resp Panel by RT-PCR (Flu A&B, Covid) Nasopharyngeal Swab     Status: None   Collection Time: 07/16/20  9:11  AM   Specimen: Nasopharyngeal Swab; Nasopharyngeal(NP) swabs in vial transport medium  Result Value Ref Range Status   SARS Coronavirus 2 by RT PCR NEGATIVE NEGATIVE Final    Comment: (NOTE) SARS-CoV-2 target nucleic acids are NOT DETECTED.  The SARS-CoV-2 RNA is generally detectable in upper respiratory specimens during the acute phase of infection. The lowest concentration of SARS-CoV-2 viral copies this assay can detect is 138 copies/mL. A negative result does not preclude SARS-Cov-2 infection and should not be used as the sole basis for treatment or other patient management decisions. A negative result may occur with  improper specimen collection/handling, submission of specimen other than nasopharyngeal swab, presence of viral mutation(s) within the areas targeted by this assay, and inadequate number of viral copies(<138 copies/mL). A negative result must be combined with clinical observations, patient history, and epidemiological information. The expected result is Negative.  Fact Sheet for Patients:  BloggerCourse.com  Fact Sheet for Healthcare Providers:  SeriousBroker.it  This test is no t yet approved or cleared by the [place] FDA and  has been authorized for detection and/or diagnosis of SARS-CoV-2 by FDA under an Emergency Use Authorization (EUA). This EUA will remain  in effect (meaning this test can be used) for the duration of the COVID-19 declaration under Section 564(b)(1) of the Act, 21 U.S.C.section 360bbb-3(b)(1), unless the authorization is terminated  or revoked sooner.       Influenza A by PCR NEGATIVE NEGATIVE Final   Influenza B by PCR NEGATIVE NEGATIVE Final    Comment: (NOTE) The Xpert Xpress SARS-CoV-2/FLU/RSV plus assay is intended as an aid in the diagnosis of influenza from Nasopharyngeal swab specimens and should not be used as a sole basis for treatment. Nasal washings and aspirates are  unacceptable for Xpert Xpress SARS-CoV-2/FLU/RSV testing.  Fact Sheet for Patients: BloggerCourse.com  Fact Sheet for Healthcare Providers: SeriousBroker.it  This test is not yet approved or cleared by the [place] FDA and has been authorized for detection and/or diagnosis of SARS-CoV-2 by FDA under an Emergency Use Authorization (EUA). This EUA will remain in effect (meaning this test can be used) for the duration of the COVID-19 declaration under Section 564(b)(1) of the Act, 21 U.S.C. section 360bbb-3(b)(1), unless the authorization is terminated or revoked.  Performed at [facility], [address], [place], [place] [address]      Labs: BNP (last 3 results) No results for input(s): BNP in the last 8760 hours. Basic Metabolic Panel: Recent Labs  Lab 07/15/20 1719 07/17/20 0102 07/18/20 0358 07/19/20 0415  NA 138 140 137 137  K 3.5 4.0 3.7 3.8  CL 102 103 101 99  CO2 24 25 22 23   GLUCOSE 207* 112* 112* 98  BUN 20 21 13 18   CREATININE 1.25* 1.19* 1.11* 1.39*  CALCIUM 9.2 8.9 9.4 9.1  MG  --   --  1.5* 2.9*   Liver Function Tests: Recent Labs  Lab 07/15/20 1719 07/17/20 0102  AST 18 20  ALT 9 10  ALKPHOS 58 56  BILITOT 0.7 0.9  PROT 6.6 6.2*  ALBUMIN 3.6 3.3*   Recent Labs  Lab 07/15/20 1719  LIPASE 20   No results for input(s): AMMONIA in the last 168 hours. CBC: Recent Labs  Lab 07/15/20 1719 07/17/20 0102 07/18/20 0358 07/19/20 0415  WBC 8.6 9.8 10.1 8.7  HGB 11.2* 10.3* 11.9* 11.2*  HCT 34.4* 33.2* 35.5* 35.6*  MCV 86.2 84.7 81.6 84.8  PLT 200 142* 103* 136*   Cardiac Enzymes: No results for input(s): CKTOTAL, CKMB, CKMBINDEX, TROPONINI in the last 168 hours. BNP: Invalid input(s): POCBNP CBG: Recent Labs  Lab 07/18/20 2355 07/19/20 1123 07/19/20 1759 07/19/20 2338 07/20/20 0607  GLUCAP 115* 93 214* 118* 122*   D-Dimer No results for input(s): DDIMER in the last 72  hours. Hgb A1c No results for input(s): HGBA1C in the last 72 hours. Lipid Profile No results for input(s): CHOL, HDL, LDLCALC, TRIG, CHOLHDL, LDLDIRECT in the last 72 hours. Thyroid function studies No results for input(s): TSH, T4TOTAL, T3FREE, THYROIDAB in the last 72 hours.  Invalid input(s): FREET3 Anemia work up No results for input(s): VITAMINB12, FOLATE, FERRITIN, TIBC, IRON, RETICCTPCT in the last 72 hours. Urinalysis    Component Value Date/Time   COLORURINE YELLOW 05/13/2020 0824   APPEARANCEUR CLEAR 05/13/2020 0824   LABSPEC 1.023 05/13/2020 0824   PHURINE 6.0  05/13/2020 0824   GLUCOSEU NEGATIVE 05/13/2020 0824   HGBUR NEGATIVE 05/13/2020 0824   BILIRUBINUR NEGATIVE 05/13/2020 0824   KETONESUR 5 (A) 05/13/2020 0824   PROTEINUR >=300 (A) 05/13/2020 0824   UROBILINOGEN 0.2 06/03/2013 1938   NITRITE NEGATIVE 05/13/2020 0824   LEUKOCYTESUR NEGATIVE 05/13/2020 0824   Sepsis Labs Invalid input(s): PROCALCITONIN,  WBC,  LACTICIDVEN Microbiology Recent Results (from the past 240 hour(s))  Resp Panel by RT-PCR (Flu A&B, Covid) Nasopharyngeal Swab     Status: None   Collection Time: 07/16/20  9:11 AM   Specimen: Nasopharyngeal Swab; Nasopharyngeal(NP) swabs in vial transport medium  Result Value Ref Range Status   SARS Coronavirus 2 by RT PCR NEGATIVE NEGATIVE Final    Comment: (NOTE) SARS-CoV-2 target nucleic acids are NOT DETECTED.  The SARS-CoV-2 RNA is generally detectable in upper respiratory specimens during the acute phase of infection. The lowest concentration of SARS-CoV-2 viral copies this assay can detect is 138 copies/mL. A negative result does not preclude SARS-Cov-2 infection and should not be used as the sole basis for treatment or other patient management decisions. A negative result may occur with  improper specimen collection/handling, submission of specimen other than nasopharyngeal swab, presence of viral mutation(s) within the areas targeted by  this assay, and inadequate number of viral copies(<138 copies/mL). A negative result must be combined with clinical observations, patient history, and epidemiological information. The expected result is Negative.  Fact Sheet for Patients:  BloggerCourse.com  Fact Sheet for Healthcare Providers:  SeriousBroker.it  This test is no t yet approved or cleared by the [place] FDA and  has been authorized for detection and/or diagnosis of SARS-CoV-2 by FDA under an Emergency Use Authorization (EUA). This EUA will remain  in effect (meaning this test can be used) for the duration of the COVID-19 declaration under Section 564(b)(1) of the Act, 21 U.S.C.section 360bbb-3(b)(1), unless the authorization is terminated  or revoked sooner.       Influenza A by PCR NEGATIVE NEGATIVE Final   Influenza B by PCR NEGATIVE NEGATIVE Final    Comment: (NOTE) The Xpert Xpress SARS-CoV-2/FLU/RSV plus assay is intended as an aid in the diagnosis of influenza from Nasopharyngeal swab specimens and should not be used as a sole basis for treatment. Nasal washings and aspirates are unacceptable for Xpert Xpress SARS-CoV-2/FLU/RSV testing.  Fact Sheet for Patients: BloggerCourse.com  Fact Sheet for Healthcare Providers: SeriousBroker.it  This test is not yet approved or cleared by the [place] FDA and has been authorized for detection and/or diagnosis of SARS-CoV-2 by FDA under an Emergency Use Authorization (EUA). This EUA will remain in effect (meaning this test can be used) for the duration of the COVID-19 declaration under Section 564(b)(1) of the Act, 21 U.S.C. section 360bbb-3(b)(1), unless the authorization is terminated or revoked.  Performed at [facility], [address], [place], [place] [address]      Time coordinating discharge: 45 minutes  SIGNED:   Coralie Keens, MD  [facility] 07/20/2020, 9:01 AM

## 2020-07-20 NOTE — Progress Notes (Signed)
Occupational Therapy Treatment Patient Details Name: [place] M Corning MRN: [id number] DOB: 1926-03-27 Today's Date: 07/20/2020    History of present illness Pt isa 86 y.o. female admitted 07/15/20 with abdominal pain; workup for SBO, acute cholecystitis. Management by NGT; no plans for sx at this time. PMH includes Alzheimer's dementia, DM2, CAD, HTN, recurrent SBO.   OT comments  Pt asleep upon OTA arrival but easily able to arouse and agreeable to OT intervention. Pt currently presents with baseline cognitive deficits, impaired balance, and decreased activity tolerance. Pt able to complete standing UB grooming tasks at sink with min guard for balance and MOD multimodal cues for sequencing of task. Pt required set- up assist for self feeding from chair. Pt currently requires MIN A for functional mobility with RW needing multimodal directional cues. Pt would continue to benefit from skilled occupational therapy while admitted and after d/c to address the below listed limitations in order to improve overall functional mobility and facilitate independence with BADL participation. DC plan remains appropriate, will follow acutely per POC.    Follow Up Recommendations  Home health OT;Supervision/Assistance - 24 hour    Equipment Recommendations  3 in 1 bedside commode    Recommendations for Other Services      Precautions / Restrictions Precautions Precautions: Fall Restrictions Weight Bearing Restrictions: No       Mobility Bed Mobility Overal bed mobility: Needs Assistance Bed Mobility: Supine to Sit     Supine to sit: Min assist;HOB elevated     General bed mobility comments: light MIN A and increased time to completed bed mobility, pt benefits from multimodal cues  Transfers Overall transfer level: Needs assistance Equipment used: Rolling walker (2 wheeled) Transfers: Sit to/from Stand Sit to Stand: Min assist         General transfer comment: light MIN A to power  up and steadying assist when transitioning BUES from sitting surface to RW, pt requried tactile cues for hand placement on RW    Balance Overall balance assessment: Needs assistance Sitting-balance support: Feet supported;No upper extremity supported Sitting balance-Leahy Scale: Fair     Standing balance support: No upper extremity supported;During functional activity Standing balance-Leahy Scale: Fair Standing balance comment: close min guard for balance when completing grooming tasks at sink                           ADL either performed or assessed with clinical judgement   ADL Overall ADL's : Needs assistance/impaired     Grooming: Wash/dry hands;Standing;Min guard;Moderate assistance;Cueing for sequencing;Cueing for safety Grooming Details (indicate cue type and reason): pt able to stand at sink to wash her hands with min gaurd for balance with RW, however pt required MOD cues for safety and sequencing of task as pt needing assist to terminate task. pt responds well to tactile and visual cues                 Toilet Transfer: Minimal assistance;Ambulation Toilet Transfer Details (indicate cue type and reason): simualted via functional mobility with RW, MIN A for balance and RW mgmt, MOD directional cues during mobility tasks         Functional mobility during ADLs: Minimal assistance;Rolling walker General ADL Comments: pt continues to present with baseline cognitive deficits, decreased activity tolerance and impaired safety awareness     Vision       Perception     Praxis      Cognition Arousal/Alertness: Awake/alert Behavior  During Therapy: WFL for tasks assessed/performed Overall Cognitive Status: History of cognitive impairments - at baseline                                 General Comments: Hx of dementia. pt unable to state name but when provided choices able to recall, pt with impaired safety awareness during session noted to  require directional cues for mobility tasks, needing multimodal cues to sequence stepping backwards to recliner with RW        Exercises     Shoulder Instructions       General Comments      Pertinent Vitals/ Pain       Pain Assessment: No/denies pain  Home Living                                          Prior Functioning/Environment              Frequency  Min 2X/week        Progress Toward Goals  OT Goals(current goals can now be found in the care plan section)  Progress towards OT goals: Progressing toward goals  Acute Rehab OT Goals Patient Stated Goal: to go home OT Goal Formulation: With patient Time For Goal Achievement: 08/01/20 Potential to Achieve Goals: Good  Plan Discharge plan remains appropriate;Frequency remains appropriate    Co-evaluation                 AM-PAC OT "6 Clicks" Daily Activity     Outcome Measure   Help from another person eating meals?: A Little (to open items) Help from another person taking care of personal grooming?: A Little Help from another person toileting, which includes using toliet, bedpan, or urinal?: A Little Help from another person bathing (including washing, rinsing, drying)?: A Little Help from another person to put on and taking off regular upper body clothing?: A Little Help from another person to put on and taking off regular lower body clothing?: A Little 6 Click Score: 18    End of Session Equipment Utilized During Treatment: Gait belt;Rolling walker  OT Visit Diagnosis: Unsteadiness on feet (R26.81);Other abnormalities of gait and mobility (R26.89);Muscle weakness (generalized) (M62.81)   Activity Tolerance Patient tolerated treatment well   Patient Left in chair;with call bell/phone within reach;with chair alarm set;Other (comment) (eating breakfast)   Nurse Communication Mobility status;Other (comment) (eating breakfast)        Time: 9024-0973 OT Time Calculation  (min): 21 min  Charges: OT General Charges $OT Visit: 1 Visit OT Treatments $Self Care/Home Management : 8-22 mins  Audery Amel., COTA/L Acute Rehabilitation Services [phone]   Angelina Pih 07/20/2020, 9:13 AM

## 2020-07-20 NOTE — Progress Notes (Signed)
[facility] Progress Note     Subjective: CC-  Tired this morning. Denies abdominal pain. Unsure if passing any flatus. Per RN patient tolerated full liquids yesterday, she liked the soup. She had another BM yesterday.  Objective: Vital signs in last 24 hours: Temp:  [98 F (36.7 C)-98.2 F (36.8 C)] 98 F (36.7 C) (12/16 0300) Pulse Rate:  [71-75] 71 (12/16 0300) Resp:  [16] 16 (12/16 0300) BP: (138-145)/(55-59) 138/55 (12/16 0300) SpO2:  [94 %-99 %] 94 % (12/16 0300) Last BM Date:  (UNK)  Intake/Output from previous day: No intake/output data recorded. Intake/Output this shift: No intake/output data recorded.  PE: Gen:  Alert, NAD, pleasant Pulm:  rate and effort normal Abd: Soft, NT/ND, +BS, no HSM Ext:  no BUE/BLE edema, calves soft and nontender Skin: no rashes noted, warm and dry  Lab Results:  Recent Labs    07/18/20 0358 07/19/20 0415  WBC 10.1 8.7  HGB 11.9* 11.2*  HCT 35.5* 35.6*  PLT 103* 136*   BMET Recent Labs    07/18/20 0358 07/19/20 0415  NA 137 137  K 3.7 3.8  CL 101 99  CO2 22 23  GLUCOSE 112* 98  BUN 13 18  CREATININE 1.11* 1.39*  CALCIUM 9.4 9.1   PT/INR No results for input(s): LABPROT, INR in the last 72 hours. CMP     Component Value Date/Time   NA 137 07/19/2020 0415   K 3.8 07/19/2020 0415   CL 99 07/19/2020 0415   CO2 23 07/19/2020 0415   GLUCOSE 98 07/19/2020 0415   BUN 18 07/19/2020 0415   CREATININE 1.39 (H) 07/19/2020 0415   CALCIUM 9.1 07/19/2020 0415   PROT 6.2 (L) 07/17/2020 0102   ALBUMIN 3.3 (L) 07/17/2020 0102   AST 20 07/17/2020 0102   ALT 10 07/17/2020 0102   ALKPHOS 56 07/17/2020 0102   BILITOT 0.9 07/17/2020 0102   GFRNONAA 36 (L) 07/19/2020 0415   GFRAA 33 (L) 10/11/2019 0321   Lipase     Component Value Date/Time   LIPASE 20 07/15/2020 1719       Studies/Results: DG Abd Portable 1V  Result Date: 07/19/2020 CLINICAL DATA:  Ileus. EXAM: PORTABLE ABDOMEN - 1 VIEW COMPARISON:   July 18, 2020. FINDINGS: The bowel gas pattern is normal. Nasogastric tube is seen looped in stomach with distal tip in proximal stomach. No radio-opaque calculi or other significant radiographic abnormality are seen. IMPRESSION: No evidence of bowel obstruction or ileus. Nasogastric tube tip is seen in proximal stomach. Electronically Signed   By: Lupita Raider M.D.   On: 07/19/2020 09:17    Anti-infectives: Anti-infectives (From admission, onward)   Start     Dose/Rate Route Frequency Ordered Stop   07/16/20 1130  cefTRIAXone (ROCEPHIN) 2 g in sodium chloride 0.9 % 100 mL IVPB  Status:  Discontinued        2 g 200 mL/hr over 30 Minutes Intravenous Daily 07/16/20 1126 07/17/20 0816       Assessment/Plan T2DM PVD CAD s/p CABG HTN HLD CKD stage III Alzheimer's dementia  Hx of multiple prior abdominal surgeries SBO - Obstruction resolved. Tolerating fulls and having bowel function. Advance to soft diet, add Boost. Bowel regimen as needed. Ok for discharge from surgical standpoint. We will sign off, please call with concerns.  ?possible cholecystitis  -I think cholecystitis is not very likely given no leukocytosis or elevation in LFTs - abx stopped by primary team and patient remains afeb without leukocytosis,  tolerating diet  TMA:UQJF diet, Boost VTE: lovenox ID: [email or web address] 12/12   LOS: 4 days    Franne Forts, [facility] Surgery 07/20/2020, 8:12 AM Please see Amion for pager number during day hours 7:00am-4:30pm

## 2020-07-20 NOTE — Care Management Important Message (Signed)
Important Message  Patient Details  Name: [place] M Firestine MRN: [id number] Date of Birth: [birth year]   Medicare Important Message Given:  Yes     Georgiann Neider P Janene Yousuf 07/20/2020, 11:04 AM

## 2020-07-20 NOTE — Plan of Care (Signed)
Patient had NGT removed on yesterday on 12/15 and is tolerating full liquid diet well. Bowel sounds are active and patient is not complaining of any pain. Plan for discharge is to go home with [facility] possibly today 12/16 pending medical clearance. No apparent distress or needs voiced. Will continue to monitor and continue current POC.

## 2020-07-22 DIAGNOSIS — K56609 Unspecified intestinal obstruction, unspecified as to partial versus complete obstruction: Secondary | ICD-10-CM | POA: Diagnosis not present

## 2020-07-22 DIAGNOSIS — Z7983 Long term (current) use of bisphosphonates: Secondary | ICD-10-CM | POA: Diagnosis not present

## 2020-07-22 DIAGNOSIS — I2510 Atherosclerotic heart disease of native coronary artery without angina pectoris: Secondary | ICD-10-CM | POA: Diagnosis not present

## 2020-07-22 DIAGNOSIS — M47816 Spondylosis without myelopathy or radiculopathy, lumbar region: Secondary | ICD-10-CM | POA: Diagnosis not present

## 2020-07-22 DIAGNOSIS — N1832 Chronic kidney disease, stage 3b: Secondary | ICD-10-CM | POA: Diagnosis not present

## 2020-07-22 DIAGNOSIS — G309 Alzheimer's disease, unspecified: Secondary | ICD-10-CM | POA: Diagnosis not present

## 2020-07-22 DIAGNOSIS — K8000 Calculus of gallbladder with acute cholecystitis without obstruction: Secondary | ICD-10-CM | POA: Diagnosis not present

## 2020-07-22 DIAGNOSIS — D734 Cyst of spleen: Secondary | ICD-10-CM | POA: Diagnosis not present

## 2020-07-22 DIAGNOSIS — E7800 Pure hypercholesterolemia, unspecified: Secondary | ICD-10-CM | POA: Diagnosis not present

## 2020-07-22 DIAGNOSIS — F0281 Dementia in other diseases classified elsewhere with behavioral disturbance: Secondary | ICD-10-CM | POA: Diagnosis not present

## 2020-07-22 DIAGNOSIS — I252 Old myocardial infarction: Secondary | ICD-10-CM | POA: Diagnosis not present

## 2020-07-22 DIAGNOSIS — D509 Iron deficiency anemia, unspecified: Secondary | ICD-10-CM | POA: Diagnosis not present

## 2020-07-22 DIAGNOSIS — E1122 Type 2 diabetes mellitus with diabetic chronic kidney disease: Secondary | ICD-10-CM | POA: Diagnosis not present

## 2020-07-22 DIAGNOSIS — I447 Left bundle-branch block, unspecified: Secondary | ICD-10-CM | POA: Diagnosis not present

## 2020-07-22 DIAGNOSIS — Z9181 History of falling: Secondary | ICD-10-CM | POA: Diagnosis not present

## 2020-07-22 DIAGNOSIS — I10 Essential (primary) hypertension: Secondary | ICD-10-CM | POA: Diagnosis not present

## 2020-07-22 DIAGNOSIS — K5730 Diverticulosis of large intestine without perforation or abscess without bleeding: Secondary | ICD-10-CM | POA: Diagnosis not present

## 2020-07-22 DIAGNOSIS — Z7984 Long term (current) use of oral hypoglycemic drugs: Secondary | ICD-10-CM | POA: Diagnosis not present

## 2020-07-22 DIAGNOSIS — I1310 Hypertensive heart and chronic kidney disease without heart failure, with stage 1 through stage 4 chronic kidney disease, or unspecified chronic kidney disease: Secondary | ICD-10-CM | POA: Diagnosis not present

## 2020-07-22 DIAGNOSIS — Z951 Presence of aortocoronary bypass graft: Secondary | ICD-10-CM | POA: Diagnosis not present

## 2020-07-22 DIAGNOSIS — F419 Anxiety disorder, unspecified: Secondary | ICD-10-CM | POA: Diagnosis not present

## 2020-07-22 DIAGNOSIS — K3189 Other diseases of stomach and duodenum: Secondary | ICD-10-CM | POA: Diagnosis not present

## 2020-07-22 DIAGNOSIS — Z7982 Long term (current) use of aspirin: Secondary | ICD-10-CM | POA: Diagnosis not present

## 2020-07-22 DIAGNOSIS — E785 Hyperlipidemia, unspecified: Secondary | ICD-10-CM | POA: Diagnosis not present

## 2020-07-22 DIAGNOSIS — I700 Atherosclerosis of aorta: Secondary | ICD-10-CM | POA: Diagnosis not present

## 2020-07-22 DIAGNOSIS — D631 Anemia in chronic kidney disease: Secondary | ICD-10-CM | POA: Diagnosis not present

## 2020-07-24 ENCOUNTER — Other Ambulatory Visit: Payer: Self-pay | Admitting: *Deleted

## 2020-07-24 DIAGNOSIS — N1832 Chronic kidney disease, stage 3b: Secondary | ICD-10-CM | POA: Diagnosis not present

## 2020-07-24 DIAGNOSIS — I509 Heart failure, unspecified: Secondary | ICD-10-CM | POA: Diagnosis not present

## 2020-07-24 DIAGNOSIS — M170 Bilateral primary osteoarthritis of knee: Secondary | ICD-10-CM | POA: Diagnosis not present

## 2020-07-24 DIAGNOSIS — F0281 Dementia in other diseases classified elsewhere with behavioral disturbance: Secondary | ICD-10-CM | POA: Diagnosis not present

## 2020-07-24 DIAGNOSIS — I739 Peripheral vascular disease, unspecified: Secondary | ICD-10-CM | POA: Diagnosis not present

## 2020-07-24 DIAGNOSIS — E1121 Type 2 diabetes mellitus with diabetic nephropathy: Secondary | ICD-10-CM | POA: Diagnosis not present

## 2020-07-24 DIAGNOSIS — I130 Hypertensive heart and chronic kidney disease with heart failure and stage 1 through stage 4 chronic kidney disease, or unspecified chronic kidney disease: Secondary | ICD-10-CM | POA: Diagnosis not present

## 2020-07-24 DIAGNOSIS — K56609 Unspecified intestinal obstruction, unspecified as to partial versus complete obstruction: Secondary | ICD-10-CM | POA: Diagnosis not present

## 2020-07-24 DIAGNOSIS — G309 Alzheimer's disease, unspecified: Secondary | ICD-10-CM | POA: Diagnosis not present

## 2020-07-24 DIAGNOSIS — Z8719 Personal history of other diseases of the digestive system: Secondary | ICD-10-CM | POA: Diagnosis not present

## 2020-07-24 DIAGNOSIS — M810 Age-related osteoporosis without current pathological fracture: Secondary | ICD-10-CM | POA: Diagnosis not present

## 2020-07-24 NOTE — Patient Outreach (Signed)
[facility]) Care Management  [facility] Care Manager  07/24/2020   [place] M Mcgough 1926-04-15 [id number]  Notified by hospital liaison that member discharged home after being admitted with small bowel obstruction.  She did not require surgery but did have general surgery consult prior to hospitalization, no follow up indicated.    Call placed to daughter in law [name], she and member's son still have concerns about the care of the member in the home but state they are working on having a caregiver go out to the home daily for support.  The caregiver is a friend of the family and will make sure member is taking meds daily as well as eating a healthy diet daily.  They also have the support of [facility] for palliative care, [facility] for home health (nursing, PT, and OT), and [facility].  If member is in need for aide services, they will request it during home visit.  Was seen on Saturday, will have another visit tomorrow.  She report that member had follow up appointment today with PCP, was given prescription refills but unsure of which ones.  State husband has started giving member Miralax as indicated on discharge instructions.  She does not have referral for GI follow up but [name] feel this may help given member's recurrent obstructions.    Call was also placed to member's husband to review outcome of PCP visit today, no answer, HIPAA compliant voice message left.  Denies any urgent concerns, encouraged to contact this care manager with questions.    Encounter Medications:  Outpatient Encounter Medications as of 07/24/2020  Medication Sig Note  . alendronate (FOSAMAX) 70 MG tablet Take 70 mg by mouth every Monday.  07/16/2020: LF #4/28 days on 05/25/2020  . ALPRAZolam (XANAX) 0.5 MG tablet Take 0.5 mg by mouth 3 (three) times daily as needed for anxiety.    Marland Kitchen amLODipine (NORVASC) 10 MG tablet Take 10 mg by mouth at bedtime.  07/16/2020: LF #90/90 days on 04/08/2020  .  aspirin EC 81 MG tablet Take 81 mg by mouth daily.    . carvedilol (COREG) 6.25 MG tablet Take 12.5 mg by mouth 2 (two) times daily.   . Cholecalciferol (VITAMIN D3) 50 MCG (2000 UT) TABS Take 1 tablet by mouth daily.   . diclofenac Sodium (VOLTAREN) 1 % GEL Apply 4 g topically 4 (four) times daily. (Patient taking differently: Apply 4 g topically 4 (four) times daily as needed (pain). ) 07/16/2020: LF #100 grams/25 days on 05/24/2020  . divalproex (DEPAKOTE) 125 MG DR tablet Take 125 mg by mouth at bedtime. 07/16/2020: LF #90/90 days on 03/11/2020  . Ferrous Sulfate (IRON PO) Take 1 tablet by mouth daily.   Marland Kitchen gabapentin (NEURONTIN) 300 MG capsule Take 600 mg by mouth at bedtime.  07/16/2020: LF #180/90 days on 05/13/2020  . hydrALAZINE (APRESOLINE) 100 MG tablet Take 1 tablet (100 mg total) by mouth 3 (three) times daily. 07/16/2020: LF #270/90 days on 04/08/2020  . lovastatin (MEVACOR) 40 MG tablet Take 1 tablet (40 mg total) by mouth every evening. 07/16/2020: LF #90/90 days on 05/13/2020  . metFORMIN (GLUCOPHAGE) 500 MG tablet Take 500 mg by mouth at bedtime. 07/16/2020: LF #90/90 days on 06/01/2020  . polyethylene glycol (MIRALAX / GLYCOLAX) 17 g packet Take 17 g by mouth daily as needed for mild constipation.   . sertraline (ZOLOFT) 25 MG tablet Take 25 mg by mouth daily. 07/16/2020: LF #90/90 days on 05/13/2020  . triamcinolone (KENALOG) 0.1 %  2-3 times a day 07/16/2020: LF #15 grams/7 days on 05/24/2020   No facility-administered encounter medications on file as of 07/24/2020.    Functional Status:  In your present state of health, do you have any difficulty performing the following activities: 07/19/2020 05/13/2020  Hearing? Y N  Comment - -  Vision? N N  Difficulty concentrating or making decisions? Y N  Comment - -  Walking or climbing stairs? Y N  Comment - -  Dressing or bathing? Y N  Comment - -  Doing errands, shopping? Y N  Comment - -  Quarry manager and eating ? - -   Comment - -  Using the Toilet? - -  Comment - -  In the past six months, have you accidently leaked urine? - -  Comment - -  Do you have problems with loss of bowel control? - -  Managing your Medications? - -  Comment - -  Managing your Finances? - -  Comment - -  Housekeeping or managing your Housekeeping? - -  Comment - -  Some recent data might be hidden    Fall/Depression Screening: Fall Risk  11/18/2019  Falls in the past year? 1  Number falls in past yr: 1  Injury with Fall? 1  Risk for fall due to : History of fall(s);Impaired balance/gait;Impaired mobility;Mental status change  Follow up Education provided;Falls prevention discussed   PHQ 2/9 Scores 11/18/2019  PHQ - 2 Score 0  Exception Documentation Medical reason    Assessment:  Goals Addressed            This Visit's Progress   . [facility] - Behavior Symptoms Management   On track    Follow up date ; 09/03/2020  Notes: Referral placed to [facility] to help with increasing support in the home and adherence to plan of care    . [facility] - Decrease inpatient admissions/ readmissions with in the next year       Timeframe:  Long-Range Goal Priority:  High Start Date:    07/24/2020                         Expected End Date:    09/03/2020  12/20 - Reviewed discharge instructions and diet recommendations with daughter in law.                      Brandon Melnick - Enhance My Mental Skills   On track    Follow Up Date 08/14/2020   - check out volunteer opportunities - stay in touch with my family and friends - take a walk daily and think about what I am seeing    Why is this important?   As we age, or sometimes because we have an illness, it feels like our memory and ability to figure things out is not very good.  There are things you can do to keep your memory and your thinking as strong as possible.    Notes:   11/15 - discussed interventions for interaction with others       Plan:  Follow-up:  Patient agrees to  Care Plan and Follow-up. Will contact PCP office to inquire about GI consult.  Will follow up with member's husband/daughter in law within the next 2 weeks.  Kemper Durie, BSN, MSN [facility] Care Management  [facility] Care Manager [phone]

## 2020-07-25 DIAGNOSIS — K56609 Unspecified intestinal obstruction, unspecified as to partial versus complete obstruction: Secondary | ICD-10-CM | POA: Diagnosis not present

## 2020-07-25 DIAGNOSIS — G309 Alzheimer's disease, unspecified: Secondary | ICD-10-CM | POA: Diagnosis not present

## 2020-07-25 DIAGNOSIS — F0281 Dementia in other diseases classified elsewhere with behavioral disturbance: Secondary | ICD-10-CM | POA: Diagnosis not present

## 2020-07-25 DIAGNOSIS — K3189 Other diseases of stomach and duodenum: Secondary | ICD-10-CM | POA: Diagnosis not present

## 2020-07-25 DIAGNOSIS — I1310 Hypertensive heart and chronic kidney disease without heart failure, with stage 1 through stage 4 chronic kidney disease, or unspecified chronic kidney disease: Secondary | ICD-10-CM | POA: Diagnosis not present

## 2020-07-25 DIAGNOSIS — K8000 Calculus of gallbladder with acute cholecystitis without obstruction: Secondary | ICD-10-CM | POA: Diagnosis not present

## 2020-07-26 DIAGNOSIS — G309 Alzheimer's disease, unspecified: Secondary | ICD-10-CM | POA: Diagnosis not present

## 2020-07-26 DIAGNOSIS — K56609 Unspecified intestinal obstruction, unspecified as to partial versus complete obstruction: Secondary | ICD-10-CM | POA: Diagnosis not present

## 2020-07-26 DIAGNOSIS — K8000 Calculus of gallbladder with acute cholecystitis without obstruction: Secondary | ICD-10-CM | POA: Diagnosis not present

## 2020-07-26 DIAGNOSIS — I1310 Hypertensive heart and chronic kidney disease without heart failure, with stage 1 through stage 4 chronic kidney disease, or unspecified chronic kidney disease: Secondary | ICD-10-CM | POA: Diagnosis not present

## 2020-07-26 DIAGNOSIS — K3189 Other diseases of stomach and duodenum: Secondary | ICD-10-CM | POA: Diagnosis not present

## 2020-07-26 DIAGNOSIS — F0281 Dementia in other diseases classified elsewhere with behavioral disturbance: Secondary | ICD-10-CM | POA: Diagnosis not present

## 2020-07-27 DIAGNOSIS — K3189 Other diseases of stomach and duodenum: Secondary | ICD-10-CM | POA: Diagnosis not present

## 2020-07-27 DIAGNOSIS — I1310 Hypertensive heart and chronic kidney disease without heart failure, with stage 1 through stage 4 chronic kidney disease, or unspecified chronic kidney disease: Secondary | ICD-10-CM | POA: Diagnosis not present

## 2020-07-27 DIAGNOSIS — K8000 Calculus of gallbladder with acute cholecystitis without obstruction: Secondary | ICD-10-CM | POA: Diagnosis not present

## 2020-07-27 DIAGNOSIS — G309 Alzheimer's disease, unspecified: Secondary | ICD-10-CM | POA: Diagnosis not present

## 2020-07-27 DIAGNOSIS — K56609 Unspecified intestinal obstruction, unspecified as to partial versus complete obstruction: Secondary | ICD-10-CM | POA: Diagnosis not present

## 2020-07-27 DIAGNOSIS — F0281 Dementia in other diseases classified elsewhere with behavioral disturbance: Secondary | ICD-10-CM | POA: Diagnosis not present

## 2020-07-31 DIAGNOSIS — F0281 Dementia in other diseases classified elsewhere with behavioral disturbance: Secondary | ICD-10-CM | POA: Diagnosis not present

## 2020-07-31 DIAGNOSIS — K56609 Unspecified intestinal obstruction, unspecified as to partial versus complete obstruction: Secondary | ICD-10-CM | POA: Diagnosis not present

## 2020-07-31 DIAGNOSIS — K3189 Other diseases of stomach and duodenum: Secondary | ICD-10-CM | POA: Diagnosis not present

## 2020-07-31 DIAGNOSIS — I1310 Hypertensive heart and chronic kidney disease without heart failure, with stage 1 through stage 4 chronic kidney disease, or unspecified chronic kidney disease: Secondary | ICD-10-CM | POA: Diagnosis not present

## 2020-07-31 DIAGNOSIS — G309 Alzheimer's disease, unspecified: Secondary | ICD-10-CM | POA: Diagnosis not present

## 2020-07-31 DIAGNOSIS — K8000 Calculus of gallbladder with acute cholecystitis without obstruction: Secondary | ICD-10-CM | POA: Diagnosis not present

## 2020-08-03 DIAGNOSIS — I1310 Hypertensive heart and chronic kidney disease without heart failure, with stage 1 through stage 4 chronic kidney disease, or unspecified chronic kidney disease: Secondary | ICD-10-CM | POA: Diagnosis not present

## 2020-08-03 DIAGNOSIS — K56609 Unspecified intestinal obstruction, unspecified as to partial versus complete obstruction: Secondary | ICD-10-CM | POA: Diagnosis not present

## 2020-08-03 DIAGNOSIS — G309 Alzheimer's disease, unspecified: Secondary | ICD-10-CM | POA: Diagnosis not present

## 2020-08-03 DIAGNOSIS — K8000 Calculus of gallbladder with acute cholecystitis without obstruction: Secondary | ICD-10-CM | POA: Diagnosis not present

## 2020-08-03 DIAGNOSIS — K3189 Other diseases of stomach and duodenum: Secondary | ICD-10-CM | POA: Diagnosis not present

## 2020-08-03 DIAGNOSIS — F0281 Dementia in other diseases classified elsewhere with behavioral disturbance: Secondary | ICD-10-CM | POA: Diagnosis not present

## 2020-08-08 DIAGNOSIS — K8000 Calculus of gallbladder with acute cholecystitis without obstruction: Secondary | ICD-10-CM | POA: Diagnosis not present

## 2020-08-08 DIAGNOSIS — F0281 Dementia in other diseases classified elsewhere with behavioral disturbance: Secondary | ICD-10-CM | POA: Diagnosis not present

## 2020-08-08 DIAGNOSIS — I1310 Hypertensive heart and chronic kidney disease without heart failure, with stage 1 through stage 4 chronic kidney disease, or unspecified chronic kidney disease: Secondary | ICD-10-CM | POA: Diagnosis not present

## 2020-08-08 DIAGNOSIS — G309 Alzheimer's disease, unspecified: Secondary | ICD-10-CM | POA: Diagnosis not present

## 2020-08-08 DIAGNOSIS — K56609 Unspecified intestinal obstruction, unspecified as to partial versus complete obstruction: Secondary | ICD-10-CM | POA: Diagnosis not present

## 2020-08-08 DIAGNOSIS — K3189 Other diseases of stomach and duodenum: Secondary | ICD-10-CM | POA: Diagnosis not present

## 2020-08-10 DIAGNOSIS — G309 Alzheimer's disease, unspecified: Secondary | ICD-10-CM | POA: Diagnosis not present

## 2020-08-10 DIAGNOSIS — F0281 Dementia in other diseases classified elsewhere with behavioral disturbance: Secondary | ICD-10-CM | POA: Diagnosis not present

## 2020-08-10 DIAGNOSIS — K8000 Calculus of gallbladder with acute cholecystitis without obstruction: Secondary | ICD-10-CM | POA: Diagnosis not present

## 2020-08-10 DIAGNOSIS — K56609 Unspecified intestinal obstruction, unspecified as to partial versus complete obstruction: Secondary | ICD-10-CM | POA: Diagnosis not present

## 2020-08-10 DIAGNOSIS — I1310 Hypertensive heart and chronic kidney disease without heart failure, with stage 1 through stage 4 chronic kidney disease, or unspecified chronic kidney disease: Secondary | ICD-10-CM | POA: Diagnosis not present

## 2020-08-10 DIAGNOSIS — K3189 Other diseases of stomach and duodenum: Secondary | ICD-10-CM | POA: Diagnosis not present

## 2020-08-11 ENCOUNTER — Other Ambulatory Visit: Payer: Self-pay | Admitting: *Deleted

## 2020-08-11 NOTE — Patient Outreach (Signed)
[facility]) Care Management  08/11/2020  [place] Sonya Chavez June 10, 1926 [id number]   Call placed to member's husband, state she is doing well.  She is taking medications, active with PT and nursing via home health.  Denies she has had any recent symptoms of recurrent bowel obstructions, no pain or discomfort.  He is unsure about follow up appointment with PCP or any follow up regarding GI referral, agrees to have this RNCM follow up with physician office.    Outgoing call placed to PCP office, spoke to [facility] regarding request for GI referral.  State order was placed but unclear which GI practice order was sent to.  She will speak with referral coordinator and call this care manager back.  Member's next PCP appointment is scheduled for 3/16.    Will await call back from [facility], will follow up within the next week if no return call.  Follow up with member will depend on call back from PCP office.  Goals Addressed            This Visit's Progress   . [facility] - Behavior Symptoms Management   On track    Follow up date ; 09/03/2020  Notes: Referral placed to [facility] to help with increasing support in the home and adherence to plan of care    . [facility] - Decrease inpatient admissions/ readmissions with in the next year   On track    Timeframe:  Long-Range Goal Priority:  High Start Date:    07/24/2020                         Expected End Date:    09/03/2020  12/20 - Reviewed discharge instructions and diet recommendations with daughter in law.                      Brandon Melnick - Enhance My Mental Skills   On track    Follow Up Date 08/14/2020   - check out volunteer opportunities - stay in touch with my family and friends - take a walk daily and think about what I am seeing    Why is this important?   As we age, or sometimes because we have an illness, it feels like our memory and ability to figure things out is not very good.  There are things you can do to keep your  memory and your thinking as strong as possible.    Notes:   11/15 - discussed interventions for interaction with others      Kemper Durie, RN, MSN [facility] Care Management  [facility] Manager [phone]

## 2020-08-15 DIAGNOSIS — F0281 Dementia in other diseases classified elsewhere with behavioral disturbance: Secondary | ICD-10-CM | POA: Diagnosis not present

## 2020-08-15 DIAGNOSIS — K8000 Calculus of gallbladder with acute cholecystitis without obstruction: Secondary | ICD-10-CM | POA: Diagnosis not present

## 2020-08-15 DIAGNOSIS — K3189 Other diseases of stomach and duodenum: Secondary | ICD-10-CM | POA: Diagnosis not present

## 2020-08-15 DIAGNOSIS — I1310 Hypertensive heart and chronic kidney disease without heart failure, with stage 1 through stage 4 chronic kidney disease, or unspecified chronic kidney disease: Secondary | ICD-10-CM | POA: Diagnosis not present

## 2020-08-15 DIAGNOSIS — K56609 Unspecified intestinal obstruction, unspecified as to partial versus complete obstruction: Secondary | ICD-10-CM | POA: Diagnosis not present

## 2020-08-15 DIAGNOSIS — G309 Alzheimer's disease, unspecified: Secondary | ICD-10-CM | POA: Diagnosis not present

## 2020-08-16 DIAGNOSIS — K3189 Other diseases of stomach and duodenum: Secondary | ICD-10-CM | POA: Diagnosis not present

## 2020-08-16 DIAGNOSIS — I1310 Hypertensive heart and chronic kidney disease without heart failure, with stage 1 through stage 4 chronic kidney disease, or unspecified chronic kidney disease: Secondary | ICD-10-CM | POA: Diagnosis not present

## 2020-08-16 DIAGNOSIS — K56609 Unspecified intestinal obstruction, unspecified as to partial versus complete obstruction: Secondary | ICD-10-CM | POA: Diagnosis not present

## 2020-08-16 DIAGNOSIS — K8000 Calculus of gallbladder with acute cholecystitis without obstruction: Secondary | ICD-10-CM | POA: Diagnosis not present

## 2020-08-16 DIAGNOSIS — F0281 Dementia in other diseases classified elsewhere with behavioral disturbance: Secondary | ICD-10-CM | POA: Diagnosis not present

## 2020-08-16 DIAGNOSIS — G309 Alzheimer's disease, unspecified: Secondary | ICD-10-CM | POA: Diagnosis not present

## 2020-08-18 ENCOUNTER — Other Ambulatory Visit: Payer: Self-pay | Admitting: *Deleted

## 2020-08-18 NOTE — Patient Outreach (Addendum)
[facility]) Care Management  08/18/2020  [place] M Ruz 1926/07/17 [id number]   Call placed to member's PCP office to follow up on pending GI referral, message left for [facility].  Will await call back, will follow up within the next week.    Update:   Incoming call received from [facility], state referral was placed but member declined seeing GI.  Will follow up with member/family within the next month as planned.   Kemper Durie, [place], MSN [facility] Care Management  [facility] Manager [phone]

## 2020-08-21 DIAGNOSIS — Z9181 History of falling: Secondary | ICD-10-CM | POA: Diagnosis not present

## 2020-08-21 DIAGNOSIS — E7800 Pure hypercholesterolemia, unspecified: Secondary | ICD-10-CM | POA: Diagnosis not present

## 2020-08-21 DIAGNOSIS — K3189 Other diseases of stomach and duodenum: Secondary | ICD-10-CM | POA: Diagnosis not present

## 2020-08-21 DIAGNOSIS — F419 Anxiety disorder, unspecified: Secondary | ICD-10-CM | POA: Diagnosis not present

## 2020-08-21 DIAGNOSIS — Z951 Presence of aortocoronary bypass graft: Secondary | ICD-10-CM | POA: Diagnosis not present

## 2020-08-21 DIAGNOSIS — D734 Cyst of spleen: Secondary | ICD-10-CM | POA: Diagnosis not present

## 2020-08-21 DIAGNOSIS — Z7984 Long term (current) use of oral hypoglycemic drugs: Secondary | ICD-10-CM | POA: Diagnosis not present

## 2020-08-21 DIAGNOSIS — Z7983 Long term (current) use of bisphosphonates: Secondary | ICD-10-CM | POA: Diagnosis not present

## 2020-08-21 DIAGNOSIS — N1832 Chronic kidney disease, stage 3b: Secondary | ICD-10-CM | POA: Diagnosis not present

## 2020-08-21 DIAGNOSIS — E785 Hyperlipidemia, unspecified: Secondary | ICD-10-CM | POA: Diagnosis not present

## 2020-08-21 DIAGNOSIS — I2510 Atherosclerotic heart disease of native coronary artery without angina pectoris: Secondary | ICD-10-CM | POA: Diagnosis not present

## 2020-08-21 DIAGNOSIS — I252 Old myocardial infarction: Secondary | ICD-10-CM | POA: Diagnosis not present

## 2020-08-21 DIAGNOSIS — I1310 Hypertensive heart and chronic kidney disease without heart failure, with stage 1 through stage 4 chronic kidney disease, or unspecified chronic kidney disease: Secondary | ICD-10-CM | POA: Diagnosis not present

## 2020-08-21 DIAGNOSIS — K56609 Unspecified intestinal obstruction, unspecified as to partial versus complete obstruction: Secondary | ICD-10-CM | POA: Diagnosis not present

## 2020-08-21 DIAGNOSIS — K8000 Calculus of gallbladder with acute cholecystitis without obstruction: Secondary | ICD-10-CM | POA: Diagnosis not present

## 2020-08-21 DIAGNOSIS — K5730 Diverticulosis of large intestine without perforation or abscess without bleeding: Secondary | ICD-10-CM | POA: Diagnosis not present

## 2020-08-21 DIAGNOSIS — I700 Atherosclerosis of aorta: Secondary | ICD-10-CM | POA: Diagnosis not present

## 2020-08-21 DIAGNOSIS — E1122 Type 2 diabetes mellitus with diabetic chronic kidney disease: Secondary | ICD-10-CM | POA: Diagnosis not present

## 2020-08-21 DIAGNOSIS — D509 Iron deficiency anemia, unspecified: Secondary | ICD-10-CM | POA: Diagnosis not present

## 2020-08-21 DIAGNOSIS — Z7982 Long term (current) use of aspirin: Secondary | ICD-10-CM | POA: Diagnosis not present

## 2020-08-21 DIAGNOSIS — I447 Left bundle-branch block, unspecified: Secondary | ICD-10-CM | POA: Diagnosis not present

## 2020-08-21 DIAGNOSIS — F0281 Dementia in other diseases classified elsewhere with behavioral disturbance: Secondary | ICD-10-CM | POA: Diagnosis not present

## 2020-08-21 DIAGNOSIS — M47816 Spondylosis without myelopathy or radiculopathy, lumbar region: Secondary | ICD-10-CM | POA: Diagnosis not present

## 2020-08-21 DIAGNOSIS — G309 Alzheimer's disease, unspecified: Secondary | ICD-10-CM | POA: Diagnosis not present

## 2020-08-21 DIAGNOSIS — D631 Anemia in chronic kidney disease: Secondary | ICD-10-CM | POA: Diagnosis not present

## 2020-08-24 ENCOUNTER — Telehealth: Payer: Self-pay

## 2020-08-24 DIAGNOSIS — F0281 Dementia in other diseases classified elsewhere with behavioral disturbance: Secondary | ICD-10-CM | POA: Diagnosis not present

## 2020-08-24 DIAGNOSIS — I1310 Hypertensive heart and chronic kidney disease without heart failure, with stage 1 through stage 4 chronic kidney disease, or unspecified chronic kidney disease: Secondary | ICD-10-CM | POA: Diagnosis not present

## 2020-08-24 DIAGNOSIS — K56609 Unspecified intestinal obstruction, unspecified as to partial versus complete obstruction: Secondary | ICD-10-CM | POA: Diagnosis not present

## 2020-08-24 DIAGNOSIS — G309 Alzheimer's disease, unspecified: Secondary | ICD-10-CM | POA: Diagnosis not present

## 2020-08-24 DIAGNOSIS — K3189 Other diseases of stomach and duodenum: Secondary | ICD-10-CM | POA: Diagnosis not present

## 2020-08-24 DIAGNOSIS — K8000 Calculus of gallbladder with acute cholecystitis without obstruction: Secondary | ICD-10-CM | POA: Diagnosis not present

## 2020-08-24 NOTE — Telephone Encounter (Signed)
Volunteer called patient on behalf of Palliative Care. Patient is doing well at this time.  

## 2020-08-27 ENCOUNTER — Emergency Department (HOSPITAL_COMMUNITY): Payer: Medicare Other

## 2020-08-27 ENCOUNTER — Encounter (HOSPITAL_COMMUNITY): Payer: Self-pay

## 2020-08-27 ENCOUNTER — Inpatient Hospital Stay (HOSPITAL_COMMUNITY)
Admission: EM | Admit: 2020-08-27 | Discharge: 2020-09-06 | DRG: 871 | Disposition: A | Discharge status: Hospice | Payer: Medicare Other | Attending: Internal Medicine | Admitting: Internal Medicine

## 2020-08-27 ENCOUNTER — Other Ambulatory Visit: Payer: Self-pay

## 2020-08-27 ENCOUNTER — Inpatient Hospital Stay (HOSPITAL_COMMUNITY): Payer: Medicare Other

## 2020-08-27 DIAGNOSIS — Z0189 Encounter for other specified special examinations: Secondary | ICD-10-CM

## 2020-08-27 DIAGNOSIS — K8020 Calculus of gallbladder without cholecystitis without obstruction: Secondary | ICD-10-CM | POA: Diagnosis present

## 2020-08-27 DIAGNOSIS — E119 Type 2 diabetes mellitus without complications: Secondary | ICD-10-CM | POA: Diagnosis not present

## 2020-08-27 DIAGNOSIS — Z888 Allergy status to other drugs, medicaments and biological substances status: Secondary | ICD-10-CM

## 2020-08-27 DIAGNOSIS — R109 Unspecified abdominal pain: Secondary | ICD-10-CM | POA: Diagnosis not present

## 2020-08-27 DIAGNOSIS — J189 Pneumonia, unspecified organism: Secondary | ICD-10-CM | POA: Diagnosis present

## 2020-08-27 DIAGNOSIS — K6389 Other specified diseases of intestine: Secondary | ICD-10-CM | POA: Diagnosis not present

## 2020-08-27 DIAGNOSIS — E1151 Type 2 diabetes mellitus with diabetic peripheral angiopathy without gangrene: Secondary | ICD-10-CM | POA: Diagnosis present

## 2020-08-27 DIAGNOSIS — E43 Unspecified severe protein-calorie malnutrition: Secondary | ICD-10-CM | POA: Diagnosis present

## 2020-08-27 DIAGNOSIS — Z7983 Long term (current) use of bisphosphonates: Secondary | ICD-10-CM

## 2020-08-27 DIAGNOSIS — E1165 Type 2 diabetes mellitus with hyperglycemia: Secondary | ICD-10-CM | POA: Diagnosis not present

## 2020-08-27 DIAGNOSIS — E1122 Type 2 diabetes mellitus with diabetic chronic kidney disease: Secondary | ICD-10-CM | POA: Diagnosis present

## 2020-08-27 DIAGNOSIS — M1990 Unspecified osteoarthritis, unspecified site: Secondary | ICD-10-CM | POA: Diagnosis present

## 2020-08-27 DIAGNOSIS — I739 Peripheral vascular disease, unspecified: Secondary | ICD-10-CM | POA: Diagnosis present

## 2020-08-27 DIAGNOSIS — R112 Nausea with vomiting, unspecified: Secondary | ICD-10-CM | POA: Diagnosis not present

## 2020-08-27 DIAGNOSIS — A419 Sepsis, unspecified organism: Secondary | ICD-10-CM | POA: Diagnosis not present

## 2020-08-27 DIAGNOSIS — R0902 Hypoxemia: Secondary | ICD-10-CM | POA: Diagnosis not present

## 2020-08-27 DIAGNOSIS — K56609 Unspecified intestinal obstruction, unspecified as to partial versus complete obstruction: Secondary | ICD-10-CM | POA: Diagnosis not present

## 2020-08-27 DIAGNOSIS — Z951 Presence of aortocoronary bypass graft: Secondary | ICD-10-CM

## 2020-08-27 DIAGNOSIS — I10 Essential (primary) hypertension: Secondary | ICD-10-CM | POA: Diagnosis present

## 2020-08-27 DIAGNOSIS — Z79899 Other long term (current) drug therapy: Secondary | ICD-10-CM

## 2020-08-27 DIAGNOSIS — J9601 Acute respiratory failure with hypoxia: Secondary | ICD-10-CM | POA: Diagnosis present

## 2020-08-27 DIAGNOSIS — I2510 Atherosclerotic heart disease of native coronary artery without angina pectoris: Secondary | ICD-10-CM | POA: Diagnosis present

## 2020-08-27 DIAGNOSIS — Z20822 Contact with and (suspected) exposure to covid-19: Secondary | ICD-10-CM | POA: Diagnosis present

## 2020-08-27 DIAGNOSIS — Z515 Encounter for palliative care: Secondary | ICD-10-CM

## 2020-08-27 DIAGNOSIS — Z4682 Encounter for fitting and adjustment of non-vascular catheter: Secondary | ICD-10-CM | POA: Diagnosis not present

## 2020-08-27 DIAGNOSIS — N1832 Chronic kidney disease, stage 3b: Secondary | ICD-10-CM | POA: Diagnosis not present

## 2020-08-27 DIAGNOSIS — E785 Hyperlipidemia, unspecified: Secondary | ICD-10-CM | POA: Diagnosis present

## 2020-08-27 DIAGNOSIS — G308 Other Alzheimer's disease: Secondary | ICD-10-CM | POA: Diagnosis not present

## 2020-08-27 DIAGNOSIS — K8021 Calculus of gallbladder without cholecystitis with obstruction: Secondary | ICD-10-CM

## 2020-08-27 DIAGNOSIS — Z681 Body mass index (BMI) 19 or less, adult: Secondary | ICD-10-CM | POA: Diagnosis not present

## 2020-08-27 DIAGNOSIS — D649 Anemia, unspecified: Secondary | ICD-10-CM | POA: Diagnosis not present

## 2020-08-27 DIAGNOSIS — I252 Old myocardial infarction: Secondary | ICD-10-CM | POA: Diagnosis not present

## 2020-08-27 DIAGNOSIS — G309 Alzheimer's disease, unspecified: Secondary | ICD-10-CM | POA: Diagnosis present

## 2020-08-27 DIAGNOSIS — N189 Chronic kidney disease, unspecified: Secondary | ICD-10-CM | POA: Diagnosis not present

## 2020-08-27 DIAGNOSIS — N183 Chronic kidney disease, stage 3 unspecified: Secondary | ICD-10-CM | POA: Diagnosis present

## 2020-08-27 DIAGNOSIS — E876 Hypokalemia: Secondary | ICD-10-CM | POA: Diagnosis not present

## 2020-08-27 DIAGNOSIS — R1110 Vomiting, unspecified: Secondary | ICD-10-CM | POA: Diagnosis not present

## 2020-08-27 DIAGNOSIS — Z4659 Encounter for fitting and adjustment of other gastrointestinal appliance and device: Secondary | ICD-10-CM

## 2020-08-27 DIAGNOSIS — E7800 Pure hypercholesterolemia, unspecified: Secondary | ICD-10-CM | POA: Diagnosis present

## 2020-08-27 DIAGNOSIS — Z833 Family history of diabetes mellitus: Secondary | ICD-10-CM

## 2020-08-27 DIAGNOSIS — M160 Bilateral primary osteoarthritis of hip: Secondary | ICD-10-CM | POA: Diagnosis not present

## 2020-08-27 DIAGNOSIS — R54 Age-related physical debility: Secondary | ICD-10-CM | POA: Diagnosis present

## 2020-08-27 DIAGNOSIS — Z7984 Long term (current) use of oral hypoglycemic drugs: Secondary | ICD-10-CM

## 2020-08-27 DIAGNOSIS — I491 Atrial premature depolarization: Secondary | ICD-10-CM | POA: Diagnosis not present

## 2020-08-27 DIAGNOSIS — I129 Hypertensive chronic kidney disease with stage 1 through stage 4 chronic kidney disease, or unspecified chronic kidney disease: Secondary | ICD-10-CM | POA: Diagnosis not present

## 2020-08-27 DIAGNOSIS — Z7982 Long term (current) use of aspirin: Secondary | ICD-10-CM

## 2020-08-27 DIAGNOSIS — R6520 Severe sepsis without septic shock: Secondary | ICD-10-CM | POA: Diagnosis not present

## 2020-08-27 DIAGNOSIS — Z66 Do not resuscitate: Secondary | ICD-10-CM | POA: Diagnosis present

## 2020-08-27 DIAGNOSIS — K5669 Other partial intestinal obstruction: Secondary | ICD-10-CM | POA: Diagnosis not present

## 2020-08-27 DIAGNOSIS — I5032 Chronic diastolic (congestive) heart failure: Secondary | ICD-10-CM | POA: Diagnosis present

## 2020-08-27 DIAGNOSIS — F0281 Dementia in other diseases classified elsewhere with behavioral disturbance: Secondary | ICD-10-CM | POA: Diagnosis not present

## 2020-08-27 DIAGNOSIS — Z90710 Acquired absence of both cervix and uterus: Secondary | ICD-10-CM

## 2020-08-27 DIAGNOSIS — G9341 Metabolic encephalopathy: Secondary | ICD-10-CM | POA: Diagnosis present

## 2020-08-27 DIAGNOSIS — Z7189 Other specified counseling: Secondary | ICD-10-CM | POA: Diagnosis not present

## 2020-08-27 DIAGNOSIS — F0280 Dementia in other diseases classified elsewhere without behavioral disturbance: Secondary | ICD-10-CM | POA: Diagnosis not present

## 2020-08-27 DIAGNOSIS — K660 Peritoneal adhesions (postprocedural) (postinfection): Secondary | ICD-10-CM | POA: Diagnosis present

## 2020-08-27 DIAGNOSIS — D696 Thrombocytopenia, unspecified: Secondary | ICD-10-CM | POA: Diagnosis not present

## 2020-08-27 DIAGNOSIS — I130 Hypertensive heart and chronic kidney disease with heart failure and stage 1 through stage 4 chronic kidney disease, or unspecified chronic kidney disease: Secondary | ICD-10-CM | POA: Diagnosis present

## 2020-08-27 DIAGNOSIS — R1084 Generalized abdominal pain: Secondary | ICD-10-CM | POA: Diagnosis not present

## 2020-08-27 DIAGNOSIS — K828 Other specified diseases of gallbladder: Secondary | ICD-10-CM | POA: Diagnosis present

## 2020-08-27 LAB — CBC WITH DIFFERENTIAL/PLATELET
Abs Immature Granulocytes: 0.03 10*3/uL (ref 0.00–0.07)
Basophils Absolute: 0 10*3/uL (ref 0.0–0.1)
Basophils Relative: 0 %
Eosinophils Absolute: 0 10*3/uL (ref 0.0–0.5)
Eosinophils Relative: 0 %
HCT: 34.2 % — ABNORMAL LOW (ref 36.0–46.0)
Hemoglobin: 10.5 g/dL — ABNORMAL LOW (ref 12.0–15.0)
Immature Granulocytes: 0 %
Lymphocytes Relative: 4 %
Lymphs Abs: 0.5 10*3/uL — ABNORMAL LOW (ref 0.7–4.0)
MCH: 26.5 pg (ref 26.0–34.0)
MCHC: 30.7 g/dL (ref 30.0–36.0)
MCV: 86.4 fL (ref 80.0–100.0)
Monocytes Absolute: 0.8 10*3/uL (ref 0.1–1.0)
Monocytes Relative: 6 %
Neutro Abs: 11.2 10*3/uL — ABNORMAL HIGH (ref 1.7–7.7)
Neutrophils Relative %: 90 %
Platelets: 133 10*3/uL — ABNORMAL LOW (ref 150–400)
RBC: 3.96 MIL/uL (ref 3.87–5.11)
RDW: 16 % — ABNORMAL HIGH (ref 11.5–15.5)
WBC: 12.5 10*3/uL — ABNORMAL HIGH (ref 4.0–10.5)
nRBC: 0 % (ref 0.0–0.2)

## 2020-08-27 LAB — COMPREHENSIVE METABOLIC PANEL
ALT: 8 U/L (ref 0–44)
AST: 17 U/L (ref 15–41)
Albumin: 3.6 g/dL (ref 3.5–5.0)
Alkaline Phosphatase: 64 U/L (ref 38–126)
Anion gap: 13 (ref 5–15)
BUN: 33 mg/dL — ABNORMAL HIGH (ref 8–23)
CO2: 29 mmol/L (ref 22–32)
Calcium: 9.3 mg/dL (ref 8.9–10.3)
Chloride: 102 mmol/L (ref 98–111)
Creatinine, Ser: 1.27 mg/dL — ABNORMAL HIGH (ref 0.44–1.00)
GFR, Estimated: 40 mL/min — ABNORMAL LOW (ref >60)
Glucose, Bld: 191 mg/dL — ABNORMAL HIGH (ref 70–99)
Potassium: 4.2 mmol/L (ref 3.5–5.1)
Sodium: 144 mmol/L (ref 135–145)
Total Bilirubin: 0.7 mg/dL (ref 0.3–1.2)
Total Protein: 7 g/dL (ref 6.5–8.1)

## 2020-08-27 LAB — PHOSPHORUS: Phosphorus: 4.1 mg/dL (ref 2.5–4.6)

## 2020-08-27 LAB — LACTIC ACID, PLASMA
Lactic Acid, Venous: 2.4 mmol/L (ref 0.5–1.9)
Lactic Acid, Venous: 2.2 mmol/L (ref 0.5–1.9)

## 2020-08-27 LAB — CK: Total CK: 48 U/L (ref 38–234)

## 2020-08-27 LAB — SARS CORONAVIRUS 2 BY RT PCR (HOSPITAL ORDER, PERFORMED IN ~~LOC~~ HOSPITAL LAB): SARS Coronavirus 2: NEGATIVE

## 2020-08-27 LAB — MAGNESIUM: Magnesium: 1.8 mg/dL (ref 1.7–2.4)

## 2020-08-27 MED ORDER — ACETAMINOPHEN 325 MG PO TABS
650.0000 mg | ORAL_TABLET | Freq: Four times a day (QID) | ORAL | Status: DC | PRN
  Administered 2020-08-28: 650 mg via ORAL
  Filled 2020-08-27: qty 2

## 2020-08-27 MED ORDER — METRONIDAZOLE IN NACL 5-0.79 MG/ML-% IV SOLN
500.0000 mg | Freq: Once | INTRAVENOUS | Status: AC
  Administered 2020-08-27: 500 mg via INTRAVENOUS
  Filled 2020-08-27: qty 100

## 2020-08-27 MED ORDER — ACETAMINOPHEN 650 MG RE SUPP: 650.0000 mg | Freq: Four times a day (QID) | RECTAL | Status: DC | PRN

## 2020-08-27 MED ORDER — METRONIDAZOLE IN NACL 5-0.79 MG/ML-% IV SOLN
500.0000 mg | Freq: Three times a day (TID) | INTRAVENOUS | Status: DC
  Administered 2020-08-28 – 2020-08-30 (×7): 500 mg via INTRAVENOUS
  Filled 2020-08-27 (×7): qty 100

## 2020-08-27 MED ORDER — SODIUM CHLORIDE 0.9 % IV BOLUS
500.0000 mL | Freq: Once | INTRAVENOUS | Status: AC
  Administered 2020-08-27: 500 mL via INTRAVENOUS

## 2020-08-27 MED ORDER — PANTOPRAZOLE SODIUM 40 MG IV SOLR
40.0000 mg | Freq: Once | INTRAVENOUS | Status: AC
  Administered 2020-08-27: 40 mg via INTRAVENOUS
  Filled 2020-08-27: qty 40

## 2020-08-27 MED ORDER — SODIUM CHLORIDE 0.9 % IV SOLN
75.0000 mL/h | INTRAVENOUS | Status: AC
  Administered 2020-08-28: 75 mL/h via INTRAVENOUS

## 2020-08-27 MED ORDER — SODIUM CHLORIDE 0.9 % IV SOLN
2.0000 g | Freq: Once | INTRAVENOUS | Status: AC
  Administered 2020-08-27: 2 g via INTRAVENOUS
  Filled 2020-08-27: qty 20

## 2020-08-27 MED ORDER — IOHEXOL 300 MG/ML  SOLN
75.0000 mL | Freq: Once | INTRAMUSCULAR | Status: AC | PRN
  Administered 2020-08-27: 75 mL via INTRAVENOUS

## 2020-08-27 MED ORDER — SODIUM CHLORIDE 0.9 % IV SOLN: Freq: Once | INTRAVENOUS | Status: AC

## 2020-08-27 MED ORDER — MORPHINE SULFATE (PF) 2 MG/ML IV SOLN
2.0000 mg | Freq: Once | INTRAVENOUS | Status: AC
  Administered 2020-08-27: 2 mg via INTRAVENOUS
  Filled 2020-08-27: qty 1

## 2020-08-27 MED ORDER — SODIUM CHLORIDE 0.9 % IV BOLUS
1000.0000 mL | Freq: Once | INTRAVENOUS | Status: AC
  Administered 2020-08-27: 1000 mL via INTRAVENOUS

## 2020-08-27 MED ORDER — SODIUM CHLORIDE 0.9 % IV SOLN
2.0000 g | INTRAVENOUS | Status: DC
  Administered 2020-08-28 – 2020-08-29 (×2): 2 g via INTRAVENOUS
  Filled 2020-08-27 (×2): qty 2

## 2020-08-27 MED ORDER — LABETALOL HCL 5 MG/ML IV SOLN
10.0000 mg | INTRAVENOUS | Status: DC | PRN
  Administered 2020-08-28 (×2): 10 mg via INTRAVENOUS
  Filled 2020-08-27 (×4): qty 4

## 2020-08-27 MED ORDER — INSULIN ASPART 100 UNIT/ML ~~LOC~~ SOLN
0.0000 [IU] | SUBCUTANEOUS | Status: DC
  Administered 2020-08-28: 2 [IU] via SUBCUTANEOUS
  Administered 2020-08-28 – 2020-08-30 (×3): 1 [IU] via SUBCUTANEOUS
  Filled 2020-08-27: qty 0.09

## 2020-08-27 NOTE — Consult Note (Signed)
Reason for Consult:sbo Referring Physician: Dr Chilton SiGreen  [name] is an 87 y.o. female.  HPI: [age] yof with history of SBO (admitted Dec 2020) and multiple ab operations presents with what from er provider and notes is n/v and some right sided pain. There is no one except patient available now and due to dementia she is not able to discuss this with me.  Husband told ER provider that over past 2-3 days she has had some right sided ab pain, nausea and vomiting.  Small bm yesterday.  dont have any other history.  She had a ct here that shows a large gallstone (present for some time), distended proximal bowel with transition zone present (does not appear related to prior sbr).  I was asked to see her.     Past Medical History:  Diagnosis Date  . Arthritis    "qwhere"  . Coronary artery disease   . Hypercholesteremia   . Hypertension   . Myocardial infarction (HCC) 2003  . SBO (small bowel obstruction) (HCC) 12/18/2016  . Stenosis of subclavian artery (HCC)   . Type II diabetes mellitus (HCC)     Past Surgical History:  Procedure Laterality Date  . ABDOMINAL HYSTERECTOMY  1970  . BOWEL RESECTION N/A 12/20/2016   Procedure: SMALL BOWEL RESECTION;  Surgeon: Abigail MiyamotoBlackman, Douglas, MD;  Location: [facility] OR;  Service: General;  Laterality: N/A;  . CARDIAC CATHETERIZATION    . CATARACT EXTRACTION Bilateral 2000's  . CORONARY [place]RA[place]  [place]  . LAPAROTOMY N/A 12/20/2016   Procedure: EXPLORATORY LAPAROTOMY;  Surgeon: Abigail MiyamotoBlackman, Douglas, MD;  [facility];  Service: General;  Laterality: N/A;  . LEFT HEART CATHETERIZATION WITH CORONARY/GRAFT ANGIOGRAM N/A 09/27/2014   Procedure: LEFT HEART CATHETERIZATION WITH Isabel CapriceORONARY/GRAFT ANGIOGRAM;  Surgeon: Pamella PertJagadeesh R Ganji, MD;  [facility];  Service: Cardiovascular;  Laterality: N/A;  . UNILATERAL UPPER EXTREMEITY ANGIOGRAM N/A 09/27/2014   Procedure: SUBCLAVIAN ARTERIOGRAM ;  Surgeon: Pamella PertJagadeesh R Ganji, MD;   [facility];  Service: Cardiovascular;  Laterality: N/A;  . UNILATERAL UPPER EXTREMEITY ANGIOGRAM N/A 11/09/2014   Procedure: Left subclavian stent ;  Surgeon: Yates DecampJay Ganji, MD;  [facility];  Service: Cardiovascular;  Laterality: N/A;  . VISCERAL ANGIOGRAM N/A 10/25/2014   Procedure: Laurence SlateSUBCLAVIAN ANGIOGRAM;  Surgeon: Yates DecampJay Ganji, MD;  [facility];  Service: Cardiovascular;  Laterality: N/A;    Family History  Problem Relation Age of Onset  . Diabetes Father   . Gallbladder disease Neg Hx     Social History:  reports that she has never smoked. She has never used smokeless tobacco. She reports that she does not drink alcohol and does not use drugs.  Allergies:  Allergies  Allergen Reactions  . Statins Other (See Comments)    Myalgias   . Other     NO FOODS WITH SEEDS  . Crestor [Rosuvastatin] Other (See Comments)    Myalgias  . Lipitor [Atorvastatin] Other (See Comments)    myalgias  . Zocor [Simvastatin] Other (See Comments)    myalgias    Medications:reviewed  Results for orders placed or performed during the hospital encounter of 08/27/20 (from the past 48 hour(s))  SARS Coronavirus 2 by RT PCR (hospital order, performed in [facility] hospital [facility]) Nasopharyngeal Nasopharyngeal Swab     Status: None   Collection Time: 08/27/20  4:21 PM   Specimen: Nasopharyngeal Swab  Result Value Ref Range   SARS Coronavirus 2  NEGATIVE NEGATIVE    Comment: (NOTE) SARS-CoV-2 target nucleic acids are NOT DETECTED.  The SARS-CoV-2 RNA is generally detectable in upper and lower respiratory specimens during the acute phase of infection. The lowest concentration of SARS-CoV-2 viral copies this assay can detect is 250 copies / mL. A negative result does not preclude SARS-CoV-2 infection and should not be used as the sole basis for treatment or other patient management decisions.  A negative result may occur with improper specimen collection / handling, submission of  specimen other than nasopharyngeal swab, presence of viral mutation(s) within the areas targeted by this assay, and inadequate number of viral copies (<250 copies / mL). A negative result must be combined with clinical observations, patient history, and epidemiological information.  Fact Sheet for Patients:   BoilerBrush.com.cy  Fact Sheet for Healthcare Providers: https://pope.com/  This test is not yet approved or  cleared by the [place] FDA and has been authorized for detection and/or diagnosis of SARS-CoV-2 by FDA under an Emergency Use Authorization (EUA).  This EUA will remain in effect (meaning this test can be used) for the duration of the COVID-19 declaration under Section 564(b)(1) of the Act, 21 U.S.C. section 360bbb-3(b)(1), unless the authorization is terminated or revoked sooner.  Performed at [facility], [address], [place], [place] [address]   CBC with Differential     Status: Abnormal   Collection Time: 08/27/20  6:03 PM  Result Value Ref Range   WBC 12.5 (H) 4.0 - 10.5 K/uL   RBC 3.96 3.87 - 5.11 MIL/uL   Hemoglobin 10.5 (L) 12.0 - 15.0 g/dL   HCT 00.9 (L) 38.1 - 82.9 %   MCV 86.4 80.0 - 100.0 fL   MCH 26.5 26.0 - 34.0 pg   MCHC 30.7 30.0 - 36.0 g/dL   RDW 93.7 (H) 16.9 - 67.8 %   Platelets 133 (L) 150 - 400 K/uL   nRBC 0.0 0.0 - 0.2 %   Neutrophils Relative % 90 %   Neutro Abs 11.2 (H) 1.7 - 7.7 K/uL   Lymphocytes Relative 4 %   Lymphs Abs 0.5 (L) 0.7 - 4.0 K/uL   Monocytes Relative 6 %   Monocytes Absolute 0.8 0.1 - 1.0 K/uL   Eosinophils Relative 0 %   Eosinophils Absolute 0.0 0.0 - 0.5 K/uL   Basophils Relative 0 %   Basophils Absolute 0.0 0.0 - 0.1 K/uL   Immature Granulocytes 0 %   Abs Immature Granulocytes 0.03 0.00 - 0.07 K/uL    Comment: Performed at [facility], [address] [place], [place] [address]  Comprehensive metabolic panel      Status: Abnormal   Collection Time: 08/27/20  6:03 PM  Result Value Ref Range   Sodium 144 135 - 145 mmol/L   Potassium 4.2 3.5 - 5.1 mmol/L   Chloride 102 98 - 111 mmol/L   CO2 29 22 - 32 mmol/L   Glucose, Bld 191 (H) 70 - 99 mg/dL    Comment: Glucose reference range applies only to samples taken after fasting for at least 8 hours.   BUN 33 (H) 8 - 23 mg/dL   Creatinine, Ser 1.75 (H) 0.44 - 1.00 mg/dL   Calcium 9.3 8.9 - 10.2 mg/dL   Total Protein 7.0 6.5 - 8.1 g/dL   Albumin 3.6 3.5 - 5.0 g/dL   AST 17 15 - 41 U/L   ALT 8 0 - 44 U/L   Alkaline Phosphatase 64 38 -  126 U/L   Total Bilirubin 0.7 0.3 - 1.2 mg/dL   GFR, Estimated 40 (L) >60 mL/min    Comment: (NOTE) Calculated using the CKD-EPI Creatinine Equation (2021)    Anion gap 13 5 - 15    Comment: Performed at [facility], [address], [place], [place] [address]  Lactic acid, plasma     Status: Abnormal   Collection Time: 08/27/20  6:03 PM  Result Value Ref Range   Lactic Acid, Venous 2.4 (HH) 0.5 - 1.9 mmol/L    Comment: CRITICAL RESULT CALLED TO, READ BACK BY AND VERIFIED WITH: Noni Saupe RIVERS RN 08/27/20 @1923  BY P.HENDERSON Performed at [facility], [address] [place], [place]   Lactic acid, plasma     Status: Abnormal   Collection Time: 08/27/20  7:55 PM  Result Value Ref Range   Lactic Acid, Venous 2.2 (HH) 0.5 - 1.9 mmol/L    Comment: CRITICAL RESULT CALLED TO, READ BACK BY AND VERIFIED WITH: 08/29/20 RIVERS RN 08/27/20 @2032  BY P.HENDERSON Performed at [facility], [address] [place], [place]     CT ABDOMEN PELVIS W CONTRAST  Result Date: 08/27/2020 CLINICAL DATA:  Right-sided abdominal pain, nausea, and vomiting. Previous history of small-bowel obstructions. EXAM: CT ABDOMEN AND PELVIS WITH CONTRAST TECHNIQUE: Multidetector CT imaging of the abdomen and pelvis was performed using the standard protocol following bolus  administration of intravenous contrast. CONTRAST:  71mL OMNIPAQUE IOHEXOL 300 MG/ML  SOLN COMPARISON:  07/16/2020 FINDINGS: Lower chest: Patchy infiltrates or atelectasis in the lung bases. Nonspecific pattern. Postoperative changes in the mediastinum. Cardiac enlargement. Dilated fluid-filled esophagus with patulous appearance of the esophagogastric junction. This likely represents reflux although dysmotility or achalasia could have this appearance. Hepatobiliary: The gallbladder is distended and contains a very large gallstone measuring up to 7.4 by 4.2 cm in diameter. Not significantly changed since prior study. There is moderate intra and extrahepatic bile duct dilatation. No common duct stones are identified. No focal liver lesions. Portal veins are patent. Pancreas: Unremarkable. No pancreatic ductal dilatation or surrounding inflammatory changes. Spleen: Focal low-attenuation lesion measuring 1.7 cm diameter. Similar appearance to previous study, likely hemangioma. Adrenals/Urinary Tract: No adrenal gland nodules. Bilateral renal parenchymal atrophy. Nephrograms are homogeneous. No hydronephrosis or hydroureter. Bladder is normal. Stomach/Bowel: Stomach is fluid distended. Duodenum and proximal jejunum are also demonstrating prominent fluid distention. The distal jejunum and ileum are decompressed with transition zone in the upper mid abdomen. Changes are consistent with high-grade small bowel obstruction. Surgical anastomosis in the ilium is well distal to the transition zone. Colon is mostly decompressed. Significant diverticulosis of the sigmoid colon without evidence of diverticulitis. Vascular/Lymphatic: Prominent aortic calcification with calcific and noncalcific plaque formation. No aneurysm. No significant lymphadenopathy. Reproductive: Status post hysterectomy. No adnexal masses. Other: No free air or free fluid in the abdomen. Abdominal wall musculature appears intact. Musculoskeletal: Spondylolysis  with mild spondylolisthesis at L4-5. Mild degenerative changes in the spine. No destructive bone lesions. IMPRESSION: 1. High-grade small bowel obstruction with transition zone in the upper mid abdomen. 2. Distended gallbladder with large gallstone. Moderate intra and extrahepatic bile duct dilatation. No common duct stones are identified. 3. Patchy infiltrates or atelectasis in the lung bases. Cardiac enlargement. 4. Significant sigmoid diverticulosis without evidence of diverticulitis. 5. Prominent aortic calcification with calcific and noncalcific plaque formation. 6. Spondylolysis with mild spondylolisthesis at L4-5. 7. Aortic atherosclerosis. Aortic Atherosclerosis (ICD10-I70.0). Electronically Signed   By: 74m  M.D.   On: 08/27/2020 19:57    Review of Systems  Unable to perform ROS: Dementia   Blood pressure (!) 189/70, pulse 83, temperature 98.6 F (37 C), temperature source Oral, resp. rate 16, height 5\' 2"  (1.575 m), weight 51.7 kg, SpO2 92 %. Physical Exam Constitutional:      Appearance: She is not diaphoretic.     Comments: Not oriented, does not respond much to questions  HENT:     Nose: Nose normal.     Mouth/Throat:     Mouth: Mucous membranes are moist.  Cardiovascular:     Rate and Rhythm: Normal rate and regular rhythm.  Pulmonary:     Effort: Pulmonary effort is normal.     Breath sounds: Normal breath sounds.  Abdominal:     Comments: Mild tender rlq, well healed incision, mild distended few bs   Skin:    General: Skin is warm and dry.  Neurological:     General: No focal deficit present.     Mental Status: She is disoriented.     Assessment/Plan: SBO -does not need surgery now, do think she will benefit from ng tube and some volume resuscitation -consider sbo protocol in am once decompressed -long term GOC needs to be discussed.  This is recurrent and at some point (even this admission) surgery might be indicated.  Question is should it be done as  I think quality of life issues are certainly appropriate to discuss with her family -lovenox fine from my standpoint  08/27/2020, 10:15 PM

## 2020-08-27 NOTE — ED Provider Notes (Signed)
Medical screening examination/treatment/procedure(s) were conducted as a shared visit with non-physician practitioner(s) and myself.  I personally evaluated the patient during the encounter.    87 year old female presents with abdominal discomfort.  History of bowel obstruction.  Abdominal exam shows diffuse tenderness.  Will order abdominal CT.   Lorre Nick, MD 08/27/20 [phone]

## 2020-08-27 NOTE — ED Triage Notes (Signed)
Pt BIB EMS Complaints of nausea, vomiting. Rigid lower right side. Hx of bowel obstruction. Zofran given by EMS 4 mg. Lives with husband, pt has dementia, responds and answers questions per baseline. Husband is on the way. 22 gauge to left wrist.   Vitals  182/78 88 pulse O2 95 Room air  CBG 269 Temp 101.8 ( Lying beside space heater)

## 2020-08-27 NOTE — H&P (Signed)
Sonya [place] M Gruber TIR:[id number] DOB: Apr 08, 1926 DOA: 08/27/2020     PCP: Velna Hatchet, MD   Outpatient Specialists:   NONE    Patient arrived to ER on 08/27/20 at 1554 Referred by Attending Lacretia Leigh, MD   Patient coming from: home Lives  With family    Chief Complaint:   Chief Complaint  Patient presents with  . bowel obstruction    HPI: Sonya Chavez is a 87 y.o. female with medical history significant of SBO, CKD 3 b, dementia, HLD, DM2, CAD, HTN    Presented with  Abdominal discomfort nausea vomiting hx of prior SBO EMS administered 4 of Zofran Initial blood pressure 182/78 pulse 88 satting 95% on RA  found to be febrile 101.8 but was lying beside space heater   CBG 269  Last admission for small bowel obstruction was in Dec 2021 She was treated with supportive medical therapy and NG tube with surgical consult improved and was able to be discharged Incidentally was found to have cholelithiasis felt to be safe with a fall as an outpatient patient has no evidence of cholecystitis at the time She reported her feet and back hurting She has not been eating well lately has not been cooking  Infectious risk factors:  Reports fever,  dry cough   Has   been vaccinated against COVID and boosted   Initial COVID TEST  NEGATIVE   Lab Results  Component Value Date   [place] 08/27/2020   [place] NEGATIVE 07/16/2020   [place] NEGATIVE 05/13/2020   [place] NEGATIVE 10/11/2019    Regarding pertinent Chronic problems:    Hyperlipidemia -  on statins mevacor Lipid Panel     Component Value Date/Time   CHOL 145 10/09/2019 0428   TRIG 80 10/09/2019 0428   HDL 36 (L) 10/09/2019 0428   CHOLHDL 4.0 10/09/2019 0428   VLDL 16 10/09/2019 0428   LDLCALC 93 10/09/2019 0428     HTN on    chronic CHF diastolic  - last echo 67/6195 Grade one diastolic disfuction  CAD  - On Aspirin, statin  10 year ago   DM 2 -  Lab Results   Component Value Date   HGBA1C 6.0 (H) 07/15/2020   on   PO meds only    CKD stage IIIb- baseline Cr 1.3 Estimated Creatinine Clearance: 22.4 mL/min (A) (by C-G formula based on SCr of 1.27 mg/dL (H)).  Lab Results  Component Value Date   CREATININE 1.27 (H) 08/27/2020   CREATININE 1.39 (H) 07/19/2020   CREATININE 1.11 (H) 07/18/2020      Dementia - on Depakote, zoloft prn xanax    Chronic anemia - baseline hg Hemoglobin & Hematocrit  Recent Labs    07/18/20 0358 07/19/20 0415 08/27/20 1803  HGB 11.9* 11.2* 10.5*    While in ER: CT showed high-grade small bowel obstruction distended gallbladder with large gallstones patchy infiltrates at the bases Could not rule out achalasia  ER Provider Called:  Gen Surgery Dr. Donne Hazel They Recommend admit to medicine   Will see in AM   Hospitalist was called for admission for SBO  The following Work up has been ordered so far:  Orders Placed This Encounter  Procedures  . SARS Coronavirus 2 by RT PCR (hospital order, performed in [facility] hospital lab) Nasopharyngeal Nasopharyngeal Swab  . Blood culture (routine x 2)  . Urine culture  . CT ABDOMEN PELVIS W CONTRAST  . CBC with Differential  . Comprehensive metabolic  panel  . Lactic acid, plasma  . Urinalysis, Routine w reflex microscopic  . Diet NPO time specified  . Insert NG/OG (Gastric Tube)  . Consult to general surgery  ALL PATIENTS BEING ADMITTED/HAVING PROCEDURES NEED COVID-19 SCREENING  . Consult to hospitalist  ALL PATIENTS BEING ADMITTED/HAVING PROCEDURES NEED COVID-19 SCREENING  . Airborne and Contact precautions  . ED EKG  . Insert peripheral IV    Following Medications were ordered in ER: Medications  pantoprazole (PROTONIX) injection 40 mg (40 mg Intravenous Given 08/27/20 1652)  0.9 %  sodium chloride infusion (0 mLs Intravenous Stopped 08/27/20 2101)  iohexol (OMNIPAQUE) 300 MG/ML solution 75 mL (75 mLs Intravenous Contrast Given 08/27/20 1948)  sodium  chloride 0.9 % bolus 1,000 mL (1,000 mLs Intravenous New Bag/Given 08/27/20 2110)  cefTRIAXone (ROCEPHIN) 2 g in sodium chloride 0.9 % 100 mL IVPB (0 g Intravenous Stopped 08/27/20 2121)    And  metroNIDAZOLE (FLAGYL) IVPB 500 mg (0 mg Intravenous Stopped 08/27/20 2154)  sodium chloride 0.9 % bolus 500 mL (0 mLs Intravenous Stopped 08/27/20 2154)  morphine 2 MG/ML injection 2 mg (2 mg Intravenous Given 08/27/20 2108)        Consult Orders  (From admission, onward)         Start     Ordered   08/27/20 2132  Consult to hospitalist  ALL PATIENTS BEING ADMITTED/HAVING PROCEDURES NEED COVID-19 SCREENING  Once       Comments: ALL PATIENTS BEING ADMITTED/HAVING PROCEDURES NEED COVID-19 SCREENING  Provider:  (Not yet assigned)  Question Answer Comment  Place call to: [facility]   Reason for Consult Admit      08/27/20 2131          Significant initial  Findings: Abnormal Labs Reviewed  CBC WITH DIFFERENTIAL/PLATELET - Abnormal; Notable for the following components:      Result Value   WBC 12.5 (*)    Hemoglobin 10.5 (*)    HCT 34.2 (*)    RDW 16.0 (*)    Platelets 133 (*)    Neutro Abs 11.2 (*)    Lymphs Abs 0.5 (*)    All other components within normal limits  COMPREHENSIVE METABOLIC PANEL - Abnormal; Notable for the following components:   Glucose, Bld 191 (*)    BUN 33 (*)    Creatinine, Ser 1.27 (*)    GFR, Estimated 40 (*)    All other components within normal limits  LACTIC ACID, PLASMA - Abnormal; Notable for the following components:   Lactic Acid, Venous 2.4 (*)    All other components within normal limits  LACTIC ACID, PLASMA - Abnormal; Notable for the following components:   Lactic Acid, Venous 2.2 (*)    All other components within normal limits    Otherwise labs showing:    Recent Labs  Lab 08/27/20 1803  NA 144  K 4.2  CO2 29  GLUCOSE 191*  BUN 33*  CREATININE 1.27*  CALCIUM 9.3    Cr   stable,    Lab Results  Component Value Date    CREATININE 1.27 (H) 08/27/2020   CREATININE 1.39 (H) 07/19/2020   CREATININE 1.11 (H) 07/18/2020    Recent Labs  Lab 08/27/20 1803  AST 17  ALT 8  ALKPHOS 64  BILITOT 0.7  PROT 7.0  ALBUMIN 3.6   Lab Results  Component Value Date   CALCIUM 9.3 08/27/2020   PHOS 3.4 05/15/2020      WBC  Component Value Date/Time   WBC 12.5 (H) 08/27/2020 1803   LYMPHSABS 0.5 (L) 08/27/2020 1803   MONOABS 0.8 08/27/2020 1803   EOSABS 0.0 08/27/2020 1803   BASOSABS 0.0 08/27/2020 1803   Plt: Lab Results  Component Value Date   PLT 133 (L) 08/27/2020    Lactic Acid, Venous    Component Value Date/Time   LATICACIDVEN 2.2 (HH) 08/27/2020 1955      Procalcitonin   Ordered   HG/HCT  stable,      Component Value Date/Time   HGB 10.5 (L) 08/27/2020 1803   HCT 34.2 (L) 08/27/2020 1803   MCV 86.4 08/27/2020 1803    No results for input(s): LIPASE, AMYLASE in the last 168 hours. No results for input(s): AMMONIA in the last 168 hours.   Cardiac Panel (last 3 results) Recent Labs    08/27/20 1803  CKTOTAL 48       ECG: Ordered Personally reviewed by me showing: HR : 88 Rhythm:  NSR,  LBBB,     no evidence of ischemic changes QTC 510   DM  labs:  HbA1C: Recent Labs    10/09/19 0428 07/15/20 1719  HGBA1C 7.0* 6.0*      UA ordered    CXR - Ordered    CTabd/pelvis -SBO    ED Triage Vitals  Enc Vitals Group     BP 08/27/20 1614 (!) 163/95     Pulse Rate 08/27/20 1614 87     Resp 08/27/20 1614 16     Temp 08/27/20 1614 98.6 F (37 C)     Temp Source 08/27/20 1614 Oral     SpO2 08/27/20 1614 91 %     Weight 08/27/20 1610 113 lb 15.7 oz (51.7 kg)     Height 08/27/20 1610 5\' 2"  (1.575 m)     Head Circumference --      Peak Flow --      Pain Score --      Pain Loc --      Pain Edu? --      Excl. in GC? --   TMAX(24)@       Latest  Blood pressure (!) 189/70, pulse 83, temperature 98.6 F (37 C), temperature source Oral, resp. rate 16, height 5\' 2"   (1.575 m), weight 51.7 kg, SpO2 92 %.    Review of Systems:    Pertinent positives include:  Fevers, chills, fatigue,  Constitutional:  No weight loss, night sweats,  weight loss  HEENT:  No headaches, Difficulty swallowing,Tooth/dental problems,Sore throat,  No sneezing, itching, ear ache, nasal congestion, post nasal drip,  Cardio-vascular:  No chest pain, Orthopnea, PND, anasarca, dizziness, palpitations.no Bilateral lower extremity swelling  GI:  No heartburn, indigestion, abdominal pain, nausea, vomiting, diarrhea, change in bowel habits, loss of appetite, melena, blood in stool, hematemesis Resp:  no shortness of breath at rest. No dyspnea on exertion, No excess mucus, no productive cough, No non-productive cough, No coughing up of blood.No change in color of mucus.No wheezing. Skin:  no rash or lesions. No jaundice GU:  no dysuria, change in color of urine, no urgency or frequency. No straining to urinate.  No flank pain.  Musculoskeletal:  No joint pain or no joint swelling. No decreased range of motion. No back pain.  Psych:  No change in mood or affect. No depression or anxiety. No memory loss.  Neuro: no localizing neurological complaints, no tingling, no weakness, no double vision, no gait abnormality, no slurred speech, no confusion  All systems reviewed and apart from [place] all are negative  Past Medical History:   Past Medical History:  Diagnosis Date  . Arthritis    "qwhere"  . Coronary artery disease   . Hypercholesteremia   . Hypertension   . Myocardial infarction ([place]) 2003  . SBO (small bowel obstruction) ([place]) 12/18/2016  . Stenosis of subclavian artery ([place])   . Type II diabetes mellitus ([place])      Past Surgical History:  Procedure Laterality Date  . ABDOMINAL HYSTERECTOMY  1970  . BOWEL RESECTION N/A 12/20/2016   Procedure: SMALL BOWEL RESECTION;  Surgeon: Coralie Keens, MD;  Location: [facility];  Service: General;  Laterality: N/A;  . CARDIAC  CATHETERIZATION    . CATARACT EXTRACTION Bilateral 2000's  . CORONARY ARTERY BYPASS GRAFT  2003   in [place], [place]  . LAPAROTOMY N/A 12/20/2016   Procedure: EXPLORATORY LAPAROTOMY;  Surgeon: Coralie Keens, MD;  Location: [facility];  Service: General;  Laterality: N/A;  . LEFT HEART CATHETERIZATION WITH CORONARY/GRAFT ANGIOGRAM N/A 09/27/2014   Procedure: LEFT HEART CATHETERIZATION WITH Beatrix Fetters;  Surgeon: Laverda Page, MD;  Location[facility] C[facility] LAB;  Service: Cardiovascular;  Laterality: N/A;  . UNILATERAL UPPER EXTREMEITY ANGIOGRAM N/A 09/27/2014   Procedure: SUBCLAVIAN ARTERIOGRAM ;  Surgeon: Laverda Page, MD;  Location: [facility] CATH LAB;  Service: Cardiovascular;  Laterality: N/A;  . UNILATERAL UPPER EXTREMEITY ANGIOGRAM N/A 11/09/2014   Procedure: Left subclavian stent ;  Surgeon: Adrian Prows, MD;  Location: [facility] CATH LAB;  Service: Cardiovascular;  Laterality: N/A;  . VISCERAL ANGIOGRAM N/A 10/25/2014   Procedure: Blanchard Kelch;  Surgeon: Adrian Prows, MD;  Location: [facility] CATH LAB;  Service: Cardiovascular;  Laterality: N/A;    Social History:  Ambulatory independently      reports that she has never smoked. She has never used smokeless tobacco. She reports that she does not drink alcohol and does not use drugs.    Family History:   Family History  Problem Relation Age of Onset  . Diabetes Father   . Gallbladder disease Neg Hx     Allergies: Allergies  Allergen Reactions  . Statins Other (See Comments)    Myalgias   . Other     NO FOODS WITH SEEDS  . Crestor [Rosuvastatin] Other (See Comments)    Myalgias  . Lipitor [Atorvastatin] Other (See Comments)    myalgias  . Zocor [Simvastatin] Other (See Comments)    myalgias     Prior to Admission medications   Medication Sig Start Date End Date Taking? Authorizing Provider  acetaminophen (TYLENOL) 500 MG tablet Take 500 mg by mouth every 6 (six) hours as needed.   Yes [provider]  alendronate  (FOSAMAX) 70 MG tablet Take 70 mg by mouth every Monday.  12/08/14  Yes [provider]  ALPRAZolam Duanne Moron) 0.5 MG tablet Take 0.5 mg by mouth 3 (three) times daily as needed for anxiety.  10/01/19  Yes [provider]  amLODipine (NORVASC) 10 MG tablet Take 10 mg by mouth at bedtime.  10/12/14  Yes [provider]  aspirin EC 81 MG tablet Take 81 mg by mouth daily.    Yes [provider]  Cholecalciferol (VITAMIN D3) 50 MCG (2000 UT) TABS Take 1 tablet by mouth daily.   Yes [provider]  diclofenac Sodium (VOLTAREN) 1 % GEL Apply 4 g topically 4 (four) times daily. Patient taking differently: Apply 4 g topically 4 (four) times daily as needed (pain).  10/11/19  Yes Allie Bossier, MD  divalproex (DEPAKOTE) 125 MG DR tablet Take 125 mg by mouth at bedtime. 03/11/20  Yes [provider]  Ferrous Sulfate (IRON PO) Take 1 tablet by mouth daily.   Yes [provider]  gabapentin (NEURONTIN) 300 MG capsule Take 600 mg by mouth at bedtime.  04/09/17  Yes [provider]  hydrALAZINE (APRESOLINE) 100 MG tablet Take 1 tablet (100 mg total) by mouth 3 (three) times daily. Patient taking differently: Take 100 mg by mouth daily. 10/11/19  Yes Allie Bossier, MD  lovastatin (MEVACOR) 40 MG tablet Take 1 tablet (40 mg total) by mouth every evening. 08/27/19  Yes Miquel Dunn, NP  metFORMIN (GLUCOPHAGE) 500 MG tablet Take 500 mg by mouth at bedtime. 03/30/20  Yes [provider]  sertraline (ZOLOFT) 25 MG tablet Take 25 mg by mouth daily. 03/11/20  Yes [provider]   Physical Exam: Vitals with BMI 08/27/2020 08/27/2020 08/27/2020  Height - - -  Weight - - -  BMI - - -  Systolic 423 536 144  Diastolic 70 64 61  Pulse 83 82 88    1. General:  in No Acute distress    Chronically ill  -appearing 2. Psychological: Alert and  Oriented 3. Head/ENT:     Dry Mucous Membranes                          Head Non traumatic, neck  supple                           Poor Dentition 4. SKIN:   decreased Skin turgor,  Skin clean Dry and intact no rash 5. Heart: Regular rate and rhythm no  Murmur, no Rub or gallop 6. Lungs:  no wheezes or crackles   7. Abdomen: Soft, diffuse-tender, Non distended  bowel sounds present 8. Lower extremities: no clubbing, cyanosis, no  edema 9. Neurologically Grossly intact, moving all 4 extremities equally  10. MSK: Normal range of motion   All other LABS:     Recent Labs  Lab 08/27/20 1803  WBC 12.5*  NEUTROABS 11.2*  HGB 10.5*  HCT 34.2*  MCV 86.4  PLT 133*     Recent Labs  Lab 08/27/20 1803  NA 144  K 4.2  CL 102  CO2 29  GLUCOSE 191*  BUN 33*  CREATININE 1.27*  CALCIUM 9.3     Recent Labs  Lab 08/27/20 1803  AST 17  ALT 8  ALKPHOS 64  BILITOT 0.7  PROT 7.0  ALBUMIN 3.6      Cultures:    Component Value Date/Time   SDES URINE, RANDOM 10/05/2019 1420   SPECREQUEST  10/05/2019 1420    NONE Performed at [facility], [place] [address], [place], [place] [address]    CULT MULTIPLE SPECIES PRESENT, SUGGEST RECOLLECTION (A) 10/05/2019 1420   REPTSTATUS 10/06/2019 FINAL 10/05/2019 1420     Radiological Exams on Admission: CT ABDOMEN PELVIS W CONTRAST  Result Date: 08/27/2020 CLINICAL DATA:  Right-sided abdominal pain, nausea, and vomiting. Previous history of small-bowel obstructions. EXAM: CT ABDOMEN AND PELVIS WITH CONTRAST TECHNIQUE: Multidetector CT imaging of the abdomen and pelvis was performed using the standard protocol following bolus administration of intravenous contrast. CONTRAST:  56mL OMNIPAQUE IOHEXOL 300 MG/ML  SOLN COMPARISON:  07/16/2020 FINDINGS: Lower chest: Patchy infiltrates or atelectasis in the lung bases. Nonspecific  pattern. Postoperative changes in the mediastinum. Cardiac enlargement. Dilated fluid-filled esophagus with patulous appearance of the esophagogastric junction. This likely represents reflux although dysmotility or  achalasia could have this appearance. Hepatobiliary: The gallbladder is distended and contains a very large gallstone measuring up to 7.4 by 4.2 cm in diameter. Not significantly changed since prior study. There is moderate intra and extrahepatic bile duct dilatation. No common duct stones are identified. No focal liver lesions. Portal veins are patent. Pancreas: Unremarkable. No pancreatic ductal dilatation or surrounding inflammatory changes. Spleen: Focal low-attenuation lesion measuring 1.7 cm diameter. Similar appearance to previous study, likely hemangioma. Adrenals/Urinary Tract: No adrenal gland nodules. Bilateral renal parenchymal atrophy. Nephrograms are homogeneous. No hydronephrosis or hydroureter. Bladder is normal. Stomach/Bowel: Stomach is fluid distended. Duodenum and proximal jejunum are also demonstrating prominent fluid distention. The distal jejunum and ileum are decompressed with transition zone in the upper mid abdomen. Changes are consistent with high-grade small bowel obstruction. Surgical anastomosis in the ilium is well distal to the transition zone. Colon is mostly decompressed. Significant diverticulosis of the sigmoid colon without evidence of diverticulitis. Vascular/Lymphatic: Prominent aortic calcification with calcific and noncalcific plaque formation. No aneurysm. No significant lymphadenopathy. Reproductive: Status post hysterectomy. No adnexal masses. Other: No free air or free fluid in the abdomen. Abdominal wall musculature appears intact. Musculoskeletal: Spondylolysis with mild spondylolisthesis at L4-5. Mild degenerative changes in the spine. No destructive bone lesions. IMPRESSION: 1. High-grade small bowel obstruction with transition zone in the upper mid abdomen. 2. Distended gallbladder with large gallstone. Moderate intra and extrahepatic bile duct dilatation. No common duct stones are identified. 3. Patchy infiltrates or atelectasis in the lung bases. Cardiac  enlargement. 4. Significant sigmoid diverticulosis without evidence of diverticulitis. 5. Prominent aortic calcification with calcific and noncalcific plaque formation. 6. Spondylolysis with mild spondylolisthesis at L4-5. 7. Aortic atherosclerosis. Aortic Atherosclerosis (ICD10-I70.0). Electronically Signed   By: Lucienne Capers M.D.   On: 08/27/2020 19:57    Chart has been reviewed   Assessment/Plan  87 y.o. female with medical history significant of SBO, CKD 3 b, dementia, HLD, DM2, CAD, HTN  Admitted for SBO and sepsis possibly pulmonary source  Present on Admission: . SBO (small bowel obstruction) ([place]) -  Likely cause  unknown  - admit for conservative management  - NG tube - NPO - KUB in AM - appreciate General surgery consult.  . Sepsis ([place]) -   -SIRS criteria met with  elevated white blood cell count,       Component Value Date/Time   WBC 12.5 (H) 08/27/2020 1803   LYMPHSABS 0.5 (L) 08/27/2020 1803     fever   RR >20 Today's Vitals   08/27/20 2015 08/27/20 2030 08/27/20 2045 08/27/20 2148  BP: (!) 175/61 (!) 158/61 (!) 167/64 (!) 189/70  Pulse: 84 88 82 83  Resp: 19 (!) 21 (!) 25 16  Temp:      TempSrc:      SpO2: (!) 89% 92% 95% 92%  Weight:      Height:         -Most likely source being:  Pulmonary vs intra-abdominal,   await results of urine  Patient meeting criteria for Severe sepsis with    evidence of end organ damage/organ dysfunction such as   elevated lactic acid >2     Component Value Date/Time   LATICACIDVEN 2.2 ([place]) 08/27/2020 7425   acute metabolic encephalopathy    acute hypoxia requiring new supplemental oxygen, SpO2: 92 %      -  Obtain serial lactic acid and procalcitonin level.  - Initiated IV antibiotics Rocephin and Flagyl  - await results of blood and urine culture  - Rehydrate aggressively        11:17 PM  Acute respiratory failure with hypoxia -  this patient has acute respiratory failure with Hypoxia and as documented by the  presence of following: O2 saturatio< 90% on RA patient was noted to sat down to 88-89% on room air Likely due to:  Pneumonia Noted on CT Provide O2 therapy and titrate as needed  Continuous pulse ox   check Pulse ox with ambulation prior to discharge   may need  TC consult for home O2 set up    CAP -possible pneumonia seen on CT of the abdomen. Obtain chest Ray patient is at high risk for aspiration.  Obtain speech pathology consult. Abnormality of esophagus worrisome for achalasia may be contributing to chronic aspiration For now continue antibiotics Patient n.p.o. Obtain sputum cultures if able to produce . PAD (peripheral artery disease) (HCC) chronic stable  . Normocytic anemia -chronic continue to follow  . HTN (hypertension) unable to tolerate p.o. labetalol as needed for now  . CKD (chronic kidney disease), stage III ([place]) -currently stable avoid nephrotoxic medications avoid hypotension   . Cholelithiasis  -patient has longstanding history of gallstone involving her gallbladder. LFTs within normal limits Appreciate general surgery input CT did not show any evidence of cholecystitis at this time Defer to general surgery for additional imaging would be indicated  . CAD (coronary artery disease) -chronic stable resume p.o. medications when able to tolerate  . Alzheimer's dementia with behavioral disturbance ([place]) monitor for any signs of sundowning delirium   Dm 2-  - Order Sensitive  SSI     -  check TSH and HgA1C  - Hold by mouth medications    Other plan as per orders.  DVT prophylaxis:  SCD        Code Status:    Code Status: Prior  DNR/DNI   as per family  I had personally discussed CODE STATUS with  family     Family Communication:   Family not at  Bedside  plan of care was discussed on the phone with   Husband  Disposition Plan:     To home once workup is complete and patient is stable   Following barriers for discharge:                             Electrolytes corrected                               Anemia stable                             Pain controlled with PO medications                               Afebrile, white count improving able to transition to PO antibiotics                             Will need to be able to tolerate PO  Will likely need home health,                             Will need consultants to evaluate patient prior to discharge                      Would benefit from PT/OT eval prior to DC  Ordered                   Swallow eval - SLP ordered                                      Transition of care consulted                   Nutrition    consulted                                      Consults called:  General surgery  Admission status:  ED Disposition    ED Disposition Condition [place]: [place] [[id number]]  Level of Care: Telemetry [5]  Admit to tele based on following criteria: Other see comments  Comments: acclerated hypertension  May admit patient to [facility] or [facility] if equivalent level of care is available:: No  Covid Evaluation: Confirmed COVID Negative  Diagnosis: SBO (small bowel obstruction) [facility]) [[id number]]  Admitting Physician: Toy Baker [[id number]]  Attending Physician: Toy Baker [[id number]]  Estimated length of stay: past midnight tomorrow  Certification:: I certify this patient will need inpatient services for at least 2 midnights          inpatient     I Expect 2 midnight stay secondary to severity of patient's current illness need for inpatient interventions justified by the following:     Severe lab/radiological/exam abnormalities including:    SBO and extensive comorbidities including:    DM2   CHF  CAD     CKD  dementia .    That are currently affecting medical management.   I expect  patient to be hospitalized for 2 midnights requiring inpatient medical care.  Patient is at  high risk for adverse outcome (such as loss of life or disability) if not treated.  Indication for inpatient stay as follows:    severe pain requiring acute inpatient management,  inability to maintain oral hydration    Need for operative/procedural  intervention    Need for IV antibiotics, IV fluids,     Level of care   tele  For 12H      Lab Results  Component Value Date   [place] 08/27/2020     Precautions: admitted as  Covid Negative    PPE: Used by the provider:   N P100  eye Goggles,  Gloves    Murtaza Shell 08/27/2020, 11:22 PM     [facility]     after 2 AM please page floor coverage PA If 7AM-7PM, please contact the day team taking care of the patient using [email or web address]   Patient was evaluated in the context of the global COVID-19 pandemic, which necessitated consideration that the patient might be at risk for infection with the SARS-CoV-2 virus that causes COVID-19. Institutional protocols and algorithms that pertain to  the evaluation of patients at risk for COVID-19 are in a state of rapid change based on information released by regulatory bodies including the CDC and federal and state organizations. These policies and algorithms were followed during the patient's care.

## 2020-08-27 NOTE — ED Provider Notes (Signed)
[facility]-EMERGENCY DEPT Provider Note   CSN: [id number] Arrival date & time: 08/27/20  1554     History Chief Complaint  Patient presents with  . bowel obstruction    Sonya Chavez is a 87 y.o. female with PMH of HTN, HLD, type II DM, MI, and small bowel obstruction who presents to the ED via EMS for nausea, emesis, and right-sided abdominal pain.  Patient has a history of dementia and lives at home with her husband.  Patient noted to be febrile to 101.61F per EMS.  I reviewed patient's medical record and she was admitted to the ED 07/15/2020 for recurrent bowel obstruction.  She responded well to medical therapy including nasogastric tube for decompression.  She was also treated with IV F, antiemetics, pain control.  Cholelithiasis was noted, but no evidence of cholecystitis.  On my examination, patient is pleasantly demented.  She is in no acute distress.  She is denying any significant symptoms at this time, but grimaces with palpation of right side of abdomen.  Husband is en route and should be arriving to ED momentarily.  Will proceed with basic laboratory work-up until he arrives.    Level 5 caveat due to dementia.  I then obtained history from her husband who is now at bedside.  He states that over the course of the past 2 to 3 days, she has been experiencing right-sided abdominal pain, nausea, and emesis x4.  Nonbloody.  He showed me a picture.  He states that she has not been eating well.  She did have something small to eat this morning, but largely insignificant.  He states that he had given her her prescribed stool softeners, with little relief.  She had 1 very small nonbloody bowel movement yesterday.  He feels confident that this is another episode of her recurrent small bowel obstructions.  HPI     Past Medical History:  Diagnosis Date  . Arthritis    "qwhere"  . Coronary artery disease   . Hypercholesteremia   . Hypertension   .  Myocardial infarction (HCC) 2003  . SBO (small bowel obstruction) (HCC) 12/18/2016  . Stenosis of subclavian artery (HCC)   . Type II diabetes mellitus [facility])     Patient Active Problem List   Diagnosis Date Noted  . Cholelithiasis with acute cholecystitis 07/16/2020  . S/P right hip fracture 05/14/2020  . DNR (do not resuscitate) 10/07/2019  . Alzheimer's dementia with behavioral disturbance (HCC)   . Palliative care encounter   . Failure to thrive (0-17) 10/06/2019  . Weakness generalized 10/05/2019  . Hypernatremia 07/18/2019  . Hypokalemia 07/18/2019  . Elevated troponin 07/17/2019  . Acute lower UTI 07/17/2019  . Positive D dimer 07/17/2019  . Normocytic anemia 07/17/2019  . Cholelithiasis 01/02/2019  . CKD (chronic kidney disease), stage III (HCC) 10/21/2017  . Delirium due to another medical condition 12/18/2016  . SBO (small bowel obstruction) (HCC) 12/18/2016  . 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 Leukocytosis 12/18/2016  . Peripheral artery disease (HCC) 11/08/2014  . PAD (peripheral artery disease) (HCC) 10/24/2014  . LBBB (left bundle branch block) 09/27/2014    Class: Chronic  . Subclavian artery stenosis, left (HCC) 09/27/2014  . Numbness and tingling in left hand 03/08/2013  . HTN (hypertension) 03/08/2013  . DM (diabetes mellitus) (HCC) 03/08/2013  . CAD (coronary artery disease) 03/08/2013    Past Surgical History:  Procedure Laterality Date  . ABDOMINAL HYSTERECTOMY  1970  . BOWEL RESECTION N/A 12/20/2016  Procedure: SMALL BOWEL RESECTION;  Surgeon: Abigail MiyamotoBlackman, Douglas, MD;  [facility];  Service: General;  Laterality: N/A;  . CARDIAC CATHETERIZATION    . CATARACT EXTRACTION Bilateral 2000's  . [place]ONARY [place]  [place]  . LAPAROTOMY N/A 12/20/2016   Procedure: EXPLORATORY LAPAROTOMY;  Surgeon: Abigail MiyamotoBlackman, Douglas, MD;  [facility];  Service: General;  Laterality: N/A;  . LEFT HEART CATHETERIZATION WITH CORONARY/GRAFT  ANGIOGRAM N/A 09/27/2014   Procedure: LEFT HEART CATHETERIZATION WITH Isabel CapriceORONARY/GRAFT ANGIOGRAM;  Surgeon: Pamella PertJagadeesh R Ganji, MD;  [facility];  Service: Cardiovascular;  Laterality: N/A;  . UNILATERAL UPPER EXTREMEITY ANGIOGRAM N/A 09/27/2014   Procedure: SUBCLAVIAN ARTERIOGRAM ;  Surgeon: Pamella PertJagadeesh R Ganji, MD;  [facility];  Service: Cardiovascular;  Laterality: N/A;  . UNILATERAL UPPER EXTREMEITY ANGIOGRAM N/A 11/09/2014   Procedure: Left subclavian stent ;  Surgeon: Yates DecampJay Ganji, MD;  [facility];  Service: Cardiovascular;  Laterality: N/A;  . VISCERAL ANGIOGRAM N/A 10/25/2014   Procedure: Laurence SlateSUBCLAVIAN ANGIOGRAM;  Surgeon: Yates DecampJay Ganji, MD;  [facility];  Service: Cardiovascular;  Laterality: N/A;     OB History   No obstetric history on file.     Family History  Problem Relation Age of Onset  . Diabetes Father   . Gallbladder disease Neg Hx     Social History   Tobacco Use  . Smoking status: Never Smoker  . Smokeless tobacco: Never Used  Vaping Use  . Vaping Use: Never used  Substance Use Topics  . Alcohol use: No  . Drug use: No    Home Medications Prior to Admission medications   Medication Sig Start Date End Date Taking? Authorizing Provider  acetaminophen (TYLENOL) 500 MG tablet Take 500 mg by mouth every 6 (six) hours as needed.   Yes [provider]  alendronate (FOSAMAX) 70 MG tablet Take 70 mg by mouth every Monday.  12/08/14  Yes [provider]  ALPRAZolam Prudy Feeler(XANAX) 0.5 MG tablet Take 0.5 mg by mouth 3 (three) times daily as needed for anxiety.  10/01/19  Yes [provider]  amLODipine (NORVASC) 10 MG tablet Take 10 mg by mouth at bedtime.  10/12/14  Yes [provider]  aspirin EC 81 MG tablet Take 81 mg by mouth daily.    Yes [provider]  Cholecalciferol (VITAMIN D3) 50 MCG (2000 UT) TABS Take 1 tablet by mouth daily.   Yes [provider]  diclofenac Sodium (VOLTAREN) 1 % GEL  Apply 4 g topically 4 (four) times daily. Patient taking differently: Apply 4 g topically 4 (four) times daily as needed (pain). 10/11/19  Yes Drema DallasWoods, Curtis J, MD  divalproex (DEPAKOTE) 125 MG DR tablet Take 125 mg by mouth at bedtime. 03/11/20  Yes [provider]  Ferrous Sulfate (IRON PO) Take 1 tablet by mouth daily.   Yes [provider]  gabapentin (NEURONTIN) 300 MG capsule Take 600 mg by mouth at bedtime.  04/09/17  Yes [provider]  hydrALAZINE (APRESOLINE) 100 MG tablet Take 1 tablet (100 mg total) by mouth 3 (three) times daily. Patient taking differently: Take 100 mg by mouth daily. 10/11/19  Yes Drema DallasWoods, Curtis J, MD  lovastatin (MEVACOR) 40 MG tablet Take 1 tablet (40 mg total) by mouth every evening. 08/27/19  Yes Toniann FailKelley, Ashton Haynes, NP  metFORMIN (GLUCOPHAGE) 500 MG tablet Take 500 mg by mouth at bedtime. 03/30/20  Yes [provider]  sertraline (ZOLOFT) 25  MG tablet Take 25 mg by mouth daily. 03/11/20  Yes [provider]    Allergies    Statins, Other, Crestor [rosuvastatin], Lipitor [atorvastatin], and Zocor [simvastatin]  Review of Systems   Review of Systems  All other systems reviewed and are negative.   Physical Exam Updated Vital Signs BP (!) 189/70 (BP Location: Right Arm)   Pulse 83   Temp 98.6 F (37 C) (Oral)   Resp 16   Ht 5\' 2"  (1.575 m)   Wt 51.7 kg   SpO2 92%   BMI 20.85 kg/m   Physical Exam Vitals and nursing note reviewed. Exam conducted with a chaperone present.  Constitutional:      Appearance: Normal appearance.  HENT:     Head: Normocephalic and atraumatic.  Eyes:     General: No scleral icterus.    Conjunctiva/sclera: Conjunctivae normal.  Cardiovascular:     Rate and Rhythm: Normal rate.     Pulses: Normal pulses.  Pulmonary:     Effort: Pulmonary effort is normal. No respiratory distress.  Abdominal:     General: Abdomen is flat. There is no distension.     Tenderness: There is abdominal  tenderness.     Comments: Soft, nondistended.  Skinny, does not feel peritoneal/rigid.  Grimaces with right-sided abdominal palpation.  Negative Murphy sign.  Musculoskeletal:        General: Normal range of motion.  Skin:    General: Skin is dry.  Neurological:     Mental Status: She is alert.     GCS: GCS eye subscore is 4. GCS verbal subscore is 5. GCS motor subscore is 6.  Psychiatric:        Mood and Affect: Mood normal.        Behavior: Behavior normal.        Thought Content: Thought content normal.     ED Results / Procedures / Treatments   Labs (all labs ordered are listed, but only abnormal results are displayed) Labs Reviewed  CBC WITH DIFFERENTIAL/PLATELET - Abnormal; Notable for the following components:      Result Value   WBC 12.5 (*)    Hemoglobin 10.5 (*)    HCT 34.2 (*)    RDW 16.0 (*)    Platelets 133 (*)    Neutro Abs 11.2 (*)    Lymphs Abs 0.5 (*)    All other components within normal limits  COMPREHENSIVE METABOLIC PANEL - Abnormal; Notable for the following components:   Glucose, Bld 191 (*)    BUN 33 (*)    Creatinine, Ser 1.27 (*)    GFR, Estimated 40 (*)    All other components within normal limits  LACTIC ACID, PLASMA - Abnormal; Notable for the following components:   Lactic Acid, Venous 2.4 (*)    All other components within normal limits  LACTIC ACID, PLASMA - Abnormal; Notable for the following components:   Lactic Acid, Venous 2.2 (*)    All other components within normal limits  SARS CORONAVIRUS 2 BY RT PCR (HOSPITAL ORDER, PERFORMED IN [facility])  CULTURE, BLOOD (ROUTINE X 2)  CULTURE, BLOOD (ROUTINE X 2)  URINE CULTURE  URINALYSIS, ROUTINE W REFLEX MICROSCOPIC  HEMOGLOBIN A1C    EKG EKG Interpretation  Date/Time:  Sunday August 27 2020 16:37:54 EST Ventricular Rate:  88 PR Interval:    QRS Duration: 152 QT Interval:  421 QTC Calculation: 510 R Axis:   -22 Text Interpretation: Sinus rhythm Left bundle branch  block No significant change since last tracing Confirmed by Lorre Nick ([id number]) on 08/27/2020 5:11:56 PM   Radiology CT ABDOMEN PELVIS W CONTRAST  Result Date: 08/27/2020 CLINICAL DATA:  Right-sided abdominal pain, nausea, and vomiting. Previous history of small-bowel obstructions. EXAM: CT ABDOMEN AND PELVIS WITH CONTRAST TECHNIQUE: Multidetector CT imaging of the abdomen and pelvis was performed using the standard protocol following bolus administration of intravenous contrast. CONTRAST:  62mL OMNIPAQUE IOHEXOL 300 MG/ML  SOLN COMPARISON:  07/16/2020 FINDINGS: Lower chest: Patchy infiltrates or atelectasis in the lung bases. Nonspecific pattern. Postoperative changes in the mediastinum. Cardiac enlargement. Dilated fluid-filled esophagus with patulous appearance of the esophagogastric junction. This likely represents reflux although dysmotility or achalasia could have this appearance. Hepatobiliary: The gallbladder is distended and contains a very large gallstone measuring up to 7.4 by 4.2 cm in diameter. Not significantly changed since prior study. There is moderate intra and extrahepatic bile duct dilatation. No common duct stones are identified. No focal liver lesions. Portal veins are patent. Pancreas: Unremarkable. No pancreatic ductal dilatation or surrounding inflammatory changes. Spleen: Focal low-attenuation lesion measuring 1.7 cm diameter. Similar appearance to previous study, likely hemangioma. Adrenals/Urinary Tract: No adrenal gland nodules. Bilateral renal parenchymal atrophy. Nephrograms are homogeneous. No hydronephrosis or hydroureter. Bladder is normal. Stomach/Bowel: Stomach is fluid distended. Duodenum and proximal jejunum are also demonstrating prominent fluid distention. The distal jejunum and ileum are decompressed with transition zone in the upper mid abdomen. Changes are consistent with high-grade small bowel obstruction. Surgical anastomosis in the ilium is well distal to the  transition zone. Colon is mostly decompressed. Significant diverticulosis of the sigmoid colon without evidence of diverticulitis. Vascular/Lymphatic: Prominent aortic calcification with calcific and noncalcific plaque formation. No aneurysm. No significant lymphadenopathy. Reproductive: Status post hysterectomy. No adnexal masses. Other: No free air or free fluid in the abdomen. Abdominal wall musculature appears intact. Musculoskeletal: Spondylolysis with mild spondylolisthesis at L4-5. Mild degenerative changes in the spine. No destructive bone lesions. IMPRESSION: 1. High-grade small bowel obstruction with transition zone in the upper mid abdomen. 2. Distended gallbladder with large gallstone. Moderate intra and extrahepatic bile duct dilatation. No common duct stones are identified. 3. Patchy infiltrates or atelectasis in the lung bases. Cardiac enlargement. 4. Significant sigmoid diverticulosis without evidence of diverticulitis. 5. Prominent aortic calcification with calcific and noncalcific plaque formation. 6. Spondylolysis with mild spondylolisthesis at L4-5. 7. Aortic atherosclerosis. Aortic Atherosclerosis (ICD10-I70.0). Electronically Signed   By: Burman Nieves M.D.   On: 08/27/2020 19:57    Procedures Procedures (including critical care time)  Medications Ordered in ED Medications  insulin aspart (novoLOG) injection 0-9 Units (has no administration in time range)  pantoprazole (PROTONIX) injection 40 mg (40 mg Intravenous Given 08/27/20 1652)  0.9 %  sodium chloride infusion (0 mLs Intravenous Stopped 08/27/20 2101)  iohexol (OMNIPAQUE) 300 MG/ML solution 75 mL (75 mLs Intravenous Contrast Given 08/27/20 1948)  sodium chloride 0.9 % bolus 1,000 mL (1,000 mLs Intravenous New Bag/Given 08/27/20 2110)  cefTRIAXone (ROCEPHIN) 2 g in sodium chloride 0.9 % 100 mL IVPB (0 g Intravenous Stopped 08/27/20 2121)    And  metroNIDAZOLE (FLAGYL) IVPB 500 mg (0 mg Intravenous Stopped 08/27/20 2154)   sodium chloride 0.9 % bolus 500 mL (0 mLs Intravenous Stopped 08/27/20 2154)  morphine 2 MG/ML injection 2 mg (2 mg Intravenous Given 08/27/20 2108)    ED Course  I have reviewed the triage vital signs and the nursing notes.  Pertinent labs & imaging results that were available during my  care of the patient were reviewed by me and considered in my medical decision making (see chart for details).  Clinical Course as of 08/27/20 2211  Wynelle LinkSun Aug 27, 2020  2100 I spoke with Dr. Dwain SarnaWakefield, general surgery, who reviewed patient's CT. Evidently she has been hesitant to proceed with surgical intervention for her recurrent small bowel obstructions in the past.  He has lower suspicion for cholecystitis given that it is likely distended in the context of not eating and drinking.  He agrees with continued pain control, antiemetics, and IV fluid resuscitation.  He would prefer medical admission and he will round on her this evening or tomorrow morning. [GG]  2210 I spoke with Dr. Adela Glimpseoutova who will see and admit patient. [GG]    Clinical Course User Index [GG] Lorelee NewGreen, Cayleb Jarnigan L, PA-C   MDM Rules/Calculators/A&P                          [place] Chavez was evaluated in Emergency Department on 08/27/2020 for the symptoms described in the history of present illness. She was evaluated in the context of the global COVID-19 pandemic, which necessitated consideration that the patient might be at risk for infection with the SARS-CoV-2 virus that causes COVID-19. Institutional protocols and algorithms that pertain to the evaluation of patients at risk for COVID-19 are in a state of rapid change based on information released by regulatory bodies including the CDC and federal and state organizations. These policies and algorithms were followed during the patient's care in the ED.  I personally reviewed patient's medical chart and all notes from triage and staff during today's encounter. I have also ordered and  reviewed all labs and imaging that I felt to be medically necessary in the evaluation of this patient's complaints and with consideration of their with their physical exam. If needed, translation services were available and utilized.   I reviewed CT abdomen pelvis demonstrates a high-grade small bowel obstruction with transition zone in the upper mid abdomen.  There is also distended gallbladder with large gallstone, moderate intra and extrahepatic bile duct dilatation.  There are also patchy infiltrates versus atelectasis in the lung bases.  COVID-19 by PCR test was negative.  We will consult general surgery.  Patient being treated empirically with Rocephin and Flagyl in addition to fluids.  She is n.p.o. status and has not had anything to eat or drink since a small meal this morning.  She has not had any episodes of emesis here in the ED and she is in no acute distress.  I spoke with Dr. Dwain SarnaWakefield, general surgery, who reviewed patient's CT. Evidently she has been hesitant to proceed with surgical intervention for her recurrent small bowel obstructions in the past.  He has lower suspicion for cholecystitis given that it is likely distended in the context of not eating and drinking.  He agrees with continued pain control, antiemetics, and IV fluid resuscitation.  He would prefer medical admission and he will round on her this evening or tomorrow morning.  I spoke with Dr. Adela Glimpseoutova who will see and admit patient.   Final Clinical Impression(s) / ED Diagnoses Final diagnoses:  Small bowel obstruction [facility](HCC)    Rx / DC Orders ED Discharge Orders    None       Elvera MariaGreen, Rania Prothero L, PA-C 08/27/20 2211    Lorre NickAllen, Anthony, MD 08/27/20 2247

## 2020-08-27 NOTE — ED Notes (Signed)
Pt placed on 2L nasal cannula O2 85% room air.

## 2020-08-28 ENCOUNTER — Other Ambulatory Visit: Payer: Self-pay | Admitting: *Deleted

## 2020-08-28 ENCOUNTER — Inpatient Hospital Stay (HOSPITAL_COMMUNITY): Payer: Medicare Other

## 2020-08-28 DIAGNOSIS — E119 Type 2 diabetes mellitus without complications: Secondary | ICD-10-CM | POA: Diagnosis not present

## 2020-08-28 DIAGNOSIS — I10 Essential (primary) hypertension: Secondary | ICD-10-CM | POA: Diagnosis not present

## 2020-08-28 DIAGNOSIS — I2510 Atherosclerotic heart disease of native coronary artery without angina pectoris: Secondary | ICD-10-CM | POA: Diagnosis not present

## 2020-08-28 DIAGNOSIS — K56609 Unspecified intestinal obstruction, unspecified as to partial versus complete obstruction: Secondary | ICD-10-CM | POA: Diagnosis not present

## 2020-08-28 LAB — URINALYSIS, ROUTINE W REFLEX MICROSCOPIC
Bacteria, UA: NONE SEEN
Bilirubin Urine: NEGATIVE
Glucose, UA: 50 mg/dL — AB
Hgb urine dipstick: NEGATIVE
Ketones, ur: NEGATIVE mg/dL
Leukocytes,Ua: NEGATIVE
Nitrite: NEGATIVE
Protein, ur: 100 mg/dL — AB
Specific Gravity, Urine: 1.026 (ref 1.005–1.030)
pH: 7 (ref 5.0–8.0)

## 2020-08-28 LAB — GLUCOSE, CAPILLARY
Glucose-Capillary: 104 mg/dL — ABNORMAL HIGH (ref 70–99)
Glucose-Capillary: 116 mg/dL — ABNORMAL HIGH (ref 70–99)
Glucose-Capillary: 124 mg/dL — ABNORMAL HIGH (ref 70–99)
Glucose-Capillary: 132 mg/dL — ABNORMAL HIGH (ref 70–99)
Glucose-Capillary: 143 mg/dL — ABNORMAL HIGH (ref 70–99)
Glucose-Capillary: 165 mg/dL — ABNORMAL HIGH (ref 70–99)
Glucose-Capillary: 68 mg/dL — ABNORMAL LOW (ref 70–99)

## 2020-08-28 LAB — COMPREHENSIVE METABOLIC PANEL
ALT: 7 U/L (ref 0–44)
AST: 12 U/L — ABNORMAL LOW (ref 15–41)
Albumin: 2.9 g/dL — ABNORMAL LOW (ref 3.5–5.0)
Alkaline Phosphatase: 45 U/L (ref 38–126)
Anion gap: 12 (ref 5–15)
BUN: 29 mg/dL — ABNORMAL HIGH (ref 8–23)
CO2: 25 mmol/L (ref 22–32)
Calcium: 8.1 mg/dL — ABNORMAL LOW (ref 8.9–10.3)
Chloride: 107 mmol/L (ref 98–111)
Creatinine, Ser: 1.19 mg/dL — ABNORMAL HIGH (ref 0.44–1.00)
GFR, Estimated: 43 mL/min — ABNORMAL LOW (ref >60)
Glucose, Bld: 129 mg/dL — ABNORMAL HIGH (ref 70–99)
Potassium: 3.5 mmol/L (ref 3.5–5.1)
Sodium: 144 mmol/L (ref 135–145)
Total Bilirubin: 0.5 mg/dL (ref 0.3–1.2)
Total Protein: 5.3 g/dL — ABNORMAL LOW (ref 6.5–8.1)

## 2020-08-28 LAB — CBC WITH DIFFERENTIAL/PLATELET
Abs Immature Granulocytes: 0.02 10*3/uL (ref 0.00–0.07)
Basophils Absolute: 0 10*3/uL (ref 0.0–0.1)
Basophils Relative: 0 %
Eosinophils Absolute: 0 10*3/uL (ref 0.0–0.5)
Eosinophils Relative: 0 %
HCT: 27.1 % — ABNORMAL LOW (ref 36.0–46.0)
Hemoglobin: 8.3 g/dL — ABNORMAL LOW (ref 12.0–15.0)
Immature Granulocytes: 0 %
Lymphocytes Relative: 7 %
Lymphs Abs: 0.6 10*3/uL — ABNORMAL LOW (ref 0.7–4.0)
MCH: 26.7 pg (ref 26.0–34.0)
MCHC: 30.6 g/dL (ref 30.0–36.0)
MCV: 87.1 fL (ref 80.0–100.0)
Monocytes Absolute: 0.9 10*3/uL (ref 0.1–1.0)
Monocytes Relative: 11 %
Neutro Abs: 6.9 10*3/uL (ref 1.7–7.7)
Neutrophils Relative %: 82 %
Platelets: 132 10*3/uL — ABNORMAL LOW (ref 150–400)
RBC: 3.11 MIL/uL — ABNORMAL LOW (ref 3.87–5.11)
RDW: 16.1 % — ABNORMAL HIGH (ref 11.5–15.5)
WBC: 8.5 10*3/uL (ref 4.0–10.5)
nRBC: 0 % (ref 0.0–0.2)

## 2020-08-28 LAB — HEMOGLOBIN A1C
Hgb A1c MFr Bld: 6 % — ABNORMAL HIGH (ref 4.8–5.6)
Mean Plasma Glucose: 125.5 mg/dL

## 2020-08-28 LAB — LACTIC ACID, PLASMA
Lactic Acid, Venous: 1.7 mmol/L (ref 0.5–1.9)
Lactic Acid, Venous: 1 mmol/L (ref 0.5–1.9)

## 2020-08-28 LAB — TSH: TSH: 0.885 u[IU]/mL (ref 0.350–4.500)

## 2020-08-28 LAB — MAGNESIUM: Magnesium: 1.7 mg/dL (ref 1.7–2.4)

## 2020-08-28 LAB — PHOSPHORUS: Phosphorus: 3.2 mg/dL (ref 2.5–4.6)

## 2020-08-28 LAB — STREP PNEUMONIAE URINARY ANTIGEN: Strep Pneumo Urinary Antigen: NEGATIVE

## 2020-08-28 LAB — PROCALCITONIN: Procalcitonin: <0.1 ng/mL

## 2020-08-28 MED ORDER — LACTATED RINGERS IV SOLN: INTRAVENOUS | Status: DC

## 2020-08-28 MED ORDER — DEXTROSE 50 % IV SOLN
12.5000 g | INTRAVENOUS | Status: AC
  Administered 2020-08-28: 12.5 g via INTRAVENOUS
  Filled 2020-08-28: qty 50

## 2020-08-28 MED ORDER — HYDRALAZINE HCL 20 MG/ML IJ SOLN: 10.0000 mg | INTRAMUSCULAR | Status: DC | PRN

## 2020-08-28 MED ORDER — MAGNESIUM SULFATE 2 GM/50ML IV SOLN
2.0000 g | Freq: Once | INTRAVENOUS | Status: AC
  Administered 2020-08-28: 2 g via INTRAVENOUS
  Filled 2020-08-28: qty 50

## 2020-08-28 MED ORDER — DIATRIZOATE MEGLUMINE & SODIUM 66-10 % PO SOLN
90.0000 mL | Freq: Once | ORAL | Status: AC
  Administered 2020-08-28: 90 mL via NASOGASTRIC
  Filled 2020-08-28: qty 90

## 2020-08-28 NOTE — Evaluation (Signed)
Physical Therapy Evaluation Patient Details Name: Sonya Chavez MRN: [id number] DOB: [birth year] Today's Date: 08/28/2020   History of Present Illness  Patient is a 87 y.o. female who presented to [facility] with Rt abdominal pain, nausea, and emesis with PMH significant for HTN, HLD, CAD s/p CABG, DMII, MI, OA, small bowel obstruction with history of resection.    Clinical Impression  Sonya Chavez is 87 y.o. female admitted with above HPI and diagnosis. Patient is currently limited by functional impairments below (see PT problem list). Patient lives with her husband and is modified independent with SPC for mobility at baseline. Pt's husband reports typically pt completes ADL's independently but her helps as needed. He reports she has needed more help recently due to weakness and was receiving HH PT 2x/week PTA.  Patient will benefit from continued skilled PT interventions to address impairments and progress independence with mobility, recommending continued HHPT services and supervision/assist for all mobility. Acute PT will follow and progress as able.     Follow Up Recommendations Home health PT;Supervision for mobility/OOB;Supervision/Assistance - 24 hour    Equipment Recommendations  None recommended by PT    Recommendations for Other Services       Precautions / Restrictions Precautions Precautions: Fall Precaution Comments: NGT Restrictions Weight Bearing Restrictions: No      Mobility  Bed Mobility Overal bed mobility: Needs Assistance             General bed mobility comments: pt OOB in recliner    Transfers Overall transfer level: Needs assistance Equipment used: 1 person hand held assist Transfers: Sit to/from Stand Sit to Stand: Min assist Stand pivot transfers: Min assist       General transfer comment: Min assist for steadying with rise. pt using bil UE's for power up from recliner/EOB and grab bar in bathroom. 1HHA provided for stand step  chair>EOB and pt reacign with contralateral UE for bed rail.  Ambulation/Gait Ambulation/Gait assistance: Min assist Gait Distance (Feet): 100 Feet [phone]) Assistive device: 1 person hand held assist;Straight cane Gait Pattern/deviations: Step-through pattern;Decreased step length - right;Decreased step length - left;Decreased stride length Gait velocity: decr   General Gait Details: pt maintained low guard position throughout. Min assist for gait with 1HHA and SPC. tactile cues/assist to guide/direct turns during gait.  Stairs            Wheelchair Mobility    Modified Rankin (Stroke Patients Only)       Balance Overall balance assessment: Needs assistance   Sitting balance-Leahy Scale: Good     Standing balance support: Single extremity supported;No upper extremity supported Standing balance-Leahy Scale: Fair Standing balance comment: pt able to stand at sink to wash hands with min guard for safety and no external support. pt also assisting ~25% or less with pulling up breif in standing.                             Pertinent Vitals/Pain Pain Assessment: Faces Faces Pain Scale: Hurts little more Pain Location: abdomen Pain Descriptors / Indicators: Discomfort Pain Intervention(s): Limited activity within patient's tolerance;Monitored during session;Repositioned    Home Living Family/patient expects to be discharged to:: Private residence Living Arrangements: Spouse/significant other Available Help at Discharge: Family;Available 24 hours/day Type of Home: House Home Access: Stairs to enter Entrance Stairs-Rails: Doctor, general practice of Steps: 1 Home Layout: One level Home Equipment: Walker - 2 wheels;Wheelchair - manual;Cane - single point Additional Comments: Pt's  husband present and provided PLOF and home layout information. Pt's husband helps with household tasks and reports pt is mobilizing around the house. He reports he helps with  homemaking and ADL's as needed.    Prior Function Level of Independence: Independent with assistive device(s)         Comments: Spouse reports pt ambulates independently with SPC and can do her own bathing/dressing.     Hand Dominance   Dominant Hand: Right    Extremity/Trunk Assessment   Upper Extremity Assessment Upper Extremity Assessment: Defer to OT evaluation;Overall WFL for tasks assessed    Lower Extremity Assessment Lower Extremity Assessment: Generalized weakness    Cervical / Trunk Assessment Cervical / Trunk Assessment: Normal  Communication   Communication: No difficulties  Cognition Arousal/Alertness: Awake/alert Behavior During Therapy: Flat affect Overall Cognitive Status: History of cognitive impairments - at baseline Area of Impairment: Following commands                       Following Commands: Follows one step commands inconsistently;Follows one step commands with increased time       General Comments: pt seems to have followed cues more this session compared to OT session earlier this AM per note but continues to require extra time and followed ~50% of cues. visual cues for demonstration to wash hands and rinse in sink after using bathroom; assist needed to use soap despite multimodal cuing.      General Comments      Exercises     Assessment/Plan    PT Assessment Patient needs continued PT services  PT Problem List Decreased strength;Decreased range of motion;Decreased balance;Decreased activity tolerance;Decreased mobility;Decreased knowledge of use of DME;Decreased knowledge of precautions       PT Treatment Interventions DME instruction;Gait training;Stair training;Functional mobility training;Therapeutic activities;Therapeutic exercise;Balance training;Neuromuscular re-education;Patient/family education    PT Goals (Current goals can be found in the Care Plan section)  Acute Rehab PT Goals Patient Stated Goal: husband  would like patient home and continue with [facility] services PT Goal Formulation: With family Time For Goal Achievement: 09/11/20 Potential to Achieve Goals: Good    Frequency Min 3X/week   Barriers to discharge        Co-evaluation               AM-PAC PT "6 Clicks" Mobility  Outcome Measure Help needed turning from your back to your side while in a flat bed without using bedrails?: A Little Help needed moving from lying on your back to sitting on the side of a flat bed without using bedrails?: A Little Help needed moving to and from a bed to a chair (including a wheelchair)?: A Little Help needed standing up from a chair using your arms (e.g., wheelchair or bedside chair)?: A Little Help needed to walk in hospital room?: A Little Help needed climbing 3-5 steps with a railing? : A Lot 6 Click Score: 17    End of Session Equipment Utilized During Treatment: Gait belt Activity Tolerance: Patient tolerated treatment well Patient left: in chair;with call bell/phone within reach;with chair alarm set;with family/visitor present;with nursing/sitter in room Nurse Communication: Mobility status PT Visit Diagnosis: Muscle weakness (generalized) (M62.81);Difficulty in walking, not elsewhere classified (R26.2)    Time: 1127-1206 PT Time Calculation (min) (ACUTE ONLY): 39 min   Charges:   PT Evaluation $PT Eval Low Complexity: 1 Low PT Treatments $Gait Training: 8-22 mins $Therapeutic Activity: 8-22 mins        Fleet Contras  Francella Solian, DPT Acute Rehabilitation Services Office [phone] Pager [phone]    Anitra Lauth 08/28/2020, 2:23 PM

## 2020-08-28 NOTE — Progress Notes (Signed)
SLP Cancellation Note  Patient Details Name: [place] M Holifield MRN: [id number] DOB: [birth year]   Cancelled treatment:       Reason Eval/Treat Not Completed: Medical issues which prohibited therapy. Patient NPO with NG and being treated for SBO. MD requested hold off on BSE for now. SLP will check in for BSE readiness next date. Thank you for this referral!  Angela Nevin, MA, CCC-SLP Speech Therapy

## 2020-08-28 NOTE — Evaluation (Signed)
Occupational Therapy Evaluation Patient Details Name: Sonya Chavez Susman MRN: [id number] DOB: 10-14-1925 Today's Date: 08/28/2020    History of Present Illness Sonya Chavez Dail is a 87 y.o. female with medical history significant of SBO, CKD 3 b, dementia, HLD, DM2, CAD, HTN. Presented with abdominal discomfort nausea vomiting.Admitted for SBO and sepsis possibly pulmonary source   Clinical Impression   This 87 yo female admitted with above presents to acute OT with PLOF per husband of being able to do her own basic ADLs and ambulate with SPC. Reprots that even if she dropped something she could bend over and pick it up. She currently is total A for all basic ADLs due to no processing how to do tasks but she has good sitting balance which makes it easier to help her with ADLs. She will continue to benefit from acute OT with follow up HHOT an d24 hour S/prn A (anytime she is up on her feet).    Follow Up Recommendations  Home health OT;Supervision/Assistance - 24 hour    Equipment Recommendations  None recommended by OT       Precautions / Restrictions Precautions Precautions: Fall Precaution Comments: NGT Restrictions Weight Bearing Restrictions: No      Mobility Bed Mobility Overal bed mobility: Needs Assistance Bed Mobility: Supine to Sit     Supine to sit: Min guard;HOB elevated          Transfers Overall transfer level: Needs assistance Equipment used: 1 person hand held assist Transfers: Sit to/from [employer] Sit to Stand: Min assist Stand pivot transfers: Min assist            Balance Overall balance assessment: Needs assistance Sitting-balance support: No upper extremity supported;Feet supported Sitting balance-Leahy Scale: Good     Standing balance support: Single extremity supported Standing balance-Leahy Scale: Poor                             ADL either performed or assessed with clinical judgement   ADL  Overall ADL's : Needs assistance/impaired Eating/Feeding: NPO Eating/Feeding Details (indicate cue type and reason): NGT Grooming: Total assistance;Wash/dry face   Upper Body Bathing: Total assistance;Sitting   Lower Body Bathing: Total assistance Lower Body Bathing Details (indicate cue type and reason): min A sit<>stand Upper Body Dressing : Total assistance;Sitting   Lower Body Dressing: Total assistance Lower Body Dressing Details (indicate cue type and reason): min A sit<>stand Toilet Transfer: Minimal assistance;Stand-pivot Toilet Transfer Details (indicate cue type and reason): bed>recliner on her left Toileting- Clothing Manipulation and Hygiene: Total assistance Toileting - Clothing Manipulation Details (indicate cue type and reason): min A sit<>stand             Vision Patient Visual Report: No change from baseline              Pertinent Vitals/Pain Pain Assessment: Faces Faces Pain Scale: No hurt     Hand Dominance Right   Extremity/Trunk Assessment Upper Extremity Assessment Upper Extremity Assessment: Overall WFL for tasks assessed           Communication Communication Communication: No difficulties   Cognition Arousal/Alertness: Awake/alert Behavior During Therapy: Flat affect Overall Cognitive Status: Impaired/Different from baseline Area of Impairment: Following commands                       Following Commands: Follows one step commands inconsistently       General Comments: Husband reports  pt can normally do her own basic ADLs; however when I took her sock off and asked her to put it back on she could not find the opening and once I re-oriented it for her she still could not figure it out. Also when handed a wash cloth to wash her face she said okay but then did not do anything with it; I washed part of her face and then handed it to her again to finish, but she did not know what to do with it even with gestures. When I asked her to  sit up on the side bed she did it without issues or hesitation.              Home Living Family/patient expects to be discharged to:: Private residence Living Arrangements: Spouse/significant other Available Help at Discharge: Family;Available 24 hours/day Type of Home: House Home Access: Stairs to enter [employer] of Steps: 1   Home Layout: One level     Bathroom Shower/Tub: Producer, television/film/video: Handicapped height     Home Equipment: Environmental consultant - 2 wheels;Wheelchair - manual;Cane - single point          Prior Functioning/Environment Level of Independence: Independent with assistive device(s)        Comments: Spouse reports pt ambulates independently with SPC and can do her own bathing/dressin        OT Problem List: Impaired balance (sitting and/or standing);Decreased cognition;Decreased safety awareness      OT Treatment/Interventions: Self-care/ADL training;DME and/or AE instruction;Patient/family education;Balance training    OT Goals(Current goals can be found in the care plan section) Acute Rehab OT Goals Patient Stated Goal: husband would like patient home OT Goal Formulation: With family Time For Goal Achievement: 09/11/20 Potential to Achieve Goals: Fair  OT Frequency: Min 2X/week              AM-PAC OT "6 Clicks" Daily Activity     Outcome Measure Help from another person eating meals?: Total Help from another person taking care of personal grooming?: Total Help from another person toileting, which includes using toliet, bedpan, or urinal?: Total Help from another person bathing (including washing, rinsing, drying)?: Total Help from another person to put on and taking off regular upper body clothing?: Total Help from another person to put on and taking off regular lower body clothing?: Total 6 Click Score: 6   End of Session Equipment Utilized During Treatment: Gait belt Nurse Communication: Mobility status (and also  NT)  Activity Tolerance: Patient tolerated treatment well Patient left: in chair;with call bell/phone within reach;with chair alarm set  OT Visit Diagnosis: Unsteadiness on feet (R26.81);Other abnormalities of gait and mobility (R26.89);Other symptoms and signs involving cognitive function                Time: 6979-4801 OT Time Calculation (min): 27 min Charges:  OT General Charges $OT Visit: 1 Visit OT Evaluation $OT Eval Moderate Complexity: 1 Mod OT Treatments $Self Care/Home Management : 8-22 mins  Ignacia Palma, OTR/L Acute [employer] Pager [phone] Office [phone]     Evette Georges 08/28/2020, 10:17 AM

## 2020-08-28 NOTE — Patient Outreach (Signed)
[facility]) Care Management  08/28/2020  [place] M Garciagarcia [birth year] [id number]   Noted that member is admitted again with SBO, 3rd admission since October 2021 with this diagnosis.  This care manager had been in contact with PCP office to discuss outpatient GI consult to monitor closely and decrease risk of readmission, patient and husband declined referral, didn't feel it was necessary.  Hospital liaisons notified of admission, will plan to contact member/family once discharged back home.  Kemper Durie, [place], MSN [facility] Care Management  [facility] Manager [phone]

## 2020-08-28 NOTE — Progress Notes (Signed)
Hypoglycemic Event  CBG: 68  Treatment: D50 25 mL (12.5 gm)  Symptoms: None  Follow-up CBG: Time:0459 CBG Result:132  Possible Reasons for Event: Vomiting and Inadequate meal intake  Comments/MD notified:    Benny Lennert

## 2020-08-28 NOTE — Progress Notes (Signed)
Unable to obtain admission history as pt has altered mental status and unable to provide information needed for documentation. Will report to oncoming nurse to obtain when husband comes in during the day.

## 2020-08-28 NOTE — Progress Notes (Signed)
[facility]  PROGRESS NOTE  [place] WUJ:[id number] DOB: 01-02-1926 DOA: 08/27/2020 PCP: Alysia PennaHolwerda, Scott, MD   Brief HPI:   87 year old female with medical history of small bowel obstruction, CKD stage IIIb, dementia, hyperlipidemia, diabetes mellitus type 2, CAD, hypertension presented with abdominal discomfort, nausea vomiting.  She has prior history of SBO.  In the ED CT scan showed high-grade small bowel obstruction, distended gallbladder with large gallstone patchy infiltrates at bases.  Cannot rule out achalasia.  General surgery was consulted, NG tube was inserted.    Subjective   Patient seen and examined, NG tube in place.  Significant output noted in the container/to wall suction.   Assessment/Plan:     1. Small bowel obstruction-NG tube inserted, general surgery has seen the patient, small bowel protocol ordered.  Management per general surgery. 2. Acute hypoxemic respiratory failure-patient presented with acute hypoxemic respiratory failure, O2 sats was 90% on room air.  She was noted to have sats of 88 to 89% on room air, likely from pneumonia noted on CT abdomen.  Patient is currently on ceftriaxone and Flagyl.  We will add Zithromax for atypical coverage. 3. Peripheral arterial disease-chronic, stable. 4. Normocytic anemia-chronic, continue to follow. 5. Hypertension-we will start hydralazine 10 mg IV every 4 hours as needed 6. Cholelithiasis-patient has known history of gallstones involving her gallbladder.  LFTs are within normal limits. 7. Diabetes mellitus type 2-CBG well controlled.  Continue sliding scale insulin with NovoLog.  Check CBG every 4 hours. 8. CAD-stable, home meds on hold as patient is n.p.o.     COVID-19 Labs  No results for input(s): DDIMER, FERRITIN, LDH, CRP in the last 72 hours.  Lab Results  Component Value Date   SARSCOV2NAA NEGATIVE 08/27/2020   SARSCOV2NAA NEGATIVE 07/16/2020   SARSCOV2NAA NEGATIVE 05/13/2020    SARSCOV2NAA NEGATIVE 10/11/2019     Scheduled medications:   . insulin aspart  0-9 Units Subcutaneous Q4H         CBG: Recent Labs  Lab 08/28/20 0001 08/28/20 0411 08/28/20 0459 08/28/20 0725  GLUCAP 165* 68* 132* 124*    SpO2: (!) 89 % O2 Flow Rate (L/min): 2 L/min    CBC: Recent Labs  Lab 08/27/20 1803 08/28/20 0635  WBC 12.5* 8.5  NEUTROABS 11.2* 6.9  HGB 10.5* 8.3*  HCT 34.2* 27.1*  MCV 86.4 87.1  PLT 133* 132*    Basic Metabolic Panel: Recent Labs  Lab 08/27/20 1803 08/28/20 0635  NA 144 144  K 4.2 3.5  CL 102 107  CO2 29 25  GLUCOSE 191* 129*  BUN 33* 29*  CREATININE 1.27* 1.19*  CALCIUM 9.3 8.1*  MG 1.8 1.7  PHOS 4.1 3.2     Liver Function Tests: Recent Labs  Lab 08/27/20 1803 08/28/20 0635  AST 17 12*  ALT 8 7  ALKPHOS 64 45  BILITOT 0.7 0.5  PROT 7.0 5.3*  ALBUMIN 3.6 2.9*     Antibiotics: Anti-infectives (From admission, onward)   Start     Dose/Rate Route Frequency Ordered Stop   08/28/20 2100  cefTRIAXone (ROCEPHIN) 2 g in sodium chloride 0.9 % 100 mL IVPB        2 g 200 mL/hr over 30 Minutes Intravenous Every 24 hours 08/27/20 2211     08/28/20 0600  metroNIDAZOLE (FLAGYL) IVPB 500 mg        500 mg 100 mL/hr over 60 Minutes Intravenous Every 8 hours 08/27/20 2211     08/27/20 2100  cefTRIAXone (ROCEPHIN) 2  g in sodium chloride 0.9 % 100 mL IVPB       "And" Linked Group Details   2 g 200 mL/hr over 30 Minutes Intravenous  Once 08/27/20 2052 08/27/20 2121   08/27/20 2100  metroNIDAZOLE (FLAGYL) IVPB 500 mg       "And" Linked Group Details   500 mg 100 mL/hr over 60 Minutes Intravenous  Once 08/27/20 2052 08/27/20 2154       DVT prophylaxis: SCDs  Code Status: DNR  Family Communication: No family at bedside   Consultants:  General surgery  Procedures:      Objective   Vitals:   08/27/20 2148 08/27/20 2351 08/28/20 0415 08/28/20 1035  BP: (!) 189/70 (!) 190/65 (!) 168/65 (!) 171/69  Pulse: 83 81  75 70  Resp: 16 16 16 14   Temp:  98.3 F (36.8 C) 97.9 F (36.6 C) 98.6 F (37 C)  TempSrc:  Oral Oral Oral  SpO2: 92% 100% 97% (!) 89%  Weight:  48 kg    Height:        Intake/Output Summary (Last 24 hours) at 08/28/2020 1404 Last data filed at 08/28/2020 0525 Gross per 24 hour  Intake 397.85 ml  Output 1850 ml  Net -1452.15 ml    01/22 1901 - 01/24 0700 In: 397.9 [I.V.:397.9] Out: 1850 [Urine:100]  Filed Weights   08/27/20 1610 08/27/20 2351  Weight: 51.7 kg 48 kg    Physical Examination:   General-appears in no acute distress, NG tube to suction  Heart-S1-S2, regular, no murmur auscultated  Lungs-clear to auscultation bilaterally, no wheezing or crackles auscultated  Abdomen-soft, nontender, no organomegaly  Extremities-no edema in the lower extremities  Neuro-alert, oriented x3, no focal deficit noted   Status is: Inpatient  Dispo: The patient is from: Home              Anticipated d/c is to: Home              Anticipated d/c date is: 08/31/2020              Patient currently not medically stable for discharge  Barrier to discharge-ongoing treatment for small bowel obstruction            Data Reviewed:   Recent Results (from the past 240 hour(s))  SARS Coronavirus 2 by RT PCR (hospital order, performed in [facility] hospital lab) Nasopharyngeal Nasopharyngeal Swab     Status: None   Collection Time: 08/27/20  4:21 PM   Specimen: Nasopharyngeal Swab  Result Value Ref Range Status   SARS Coronavirus 2 NEGATIVE NEGATIVE Final    Comment: (NOTE) SARS-CoV-2 target nucleic acids are NOT DETECTED.  The SARS-CoV-2 RNA is generally detectable in upper and lower respiratory specimens during the acute phase of infection. The lowest concentration of SARS-CoV-2 viral copies this assay can detect is 250 copies / mL. A negative result does not preclude SARS-CoV-2 infection and should not be used as the sole basis for treatment or other patient  management decisions.  A negative result may occur with improper specimen collection / handling, submission of specimen other than nasopharyngeal swab, presence of viral mutation(s) within the areas targeted by this assay, and inadequate number of viral copies (<250 copies / mL). A negative result must be combined with clinical observations, patient history, and epidemiological information.  Fact Sheet for Patients:   08/29/20  Fact Sheet for Healthcare Providers: BoilerBrush.com.cy  This test is not yet approved or  cleared by the https://pope.com/  States FDA and has been authorized for detection and/or diagnosis of SARS-CoV-2 by FDA under an Emergency Use Authorization (EUA).  This EUA will remain in effect (meaning this test can be used) for the duration of the COVID-19 declaration under Section 564(b)(1) of the Act, 21 U.S.C. section 360bbb-3(b)(1), unless the authorization is terminated or revoked sooner.  Performed at [facility], [address] [place], [place] [address]     No results for input(s): LIPASE, AMYLASE in the last 168 hours. No results for input(s): AMMONIA in the last 168 hours.  Cardiac Enzymes: Recent Labs  Lab 08/27/20 1803  CKTOTAL 48   BNP (last 3 results) No results for input(s): BNP in the last 8760 hours.  ProBNP (last 3 results) No results for input(s): PROBNP in the last 8760 hours.  Studies:  DG Chest 1 View  Result Date: 08/28/2020 CLINICAL DATA:  Nasogastric tube placement EXAM: CHEST  1 VIEW COMPARISON:  05/13/2020 FINDINGS: Nasogastric tube has been placed with its tip extending into the left mid abdomen overlying the expected mid body of the stomach. Lung volumes are small, but are symmetric and are clear. No pneumothorax or pleural effusion. Coronary artery bypass grafting has been performed. Cardiac size is within normal limits. IMPRESSION: Nasogastric tube tip within  the expected mid body of the stomach. Electronically Signed   By: Helyn Numbers MD   On: 08/28/2020 03:28   CT ABDOMEN PELVIS W CONTRAST  Result Date: 08/27/2020 CLINICAL DATA:  Right-sided abdominal pain, nausea, and vomiting. Previous history of small-bowel obstructions. EXAM: CT ABDOMEN AND PELVIS WITH CONTRAST TECHNIQUE: Multidetector CT imaging of the abdomen and pelvis was performed using the standard protocol following bolus administration of intravenous contrast. CONTRAST:  52mL OMNIPAQUE IOHEXOL 300 MG/ML  SOLN COMPARISON:  07/16/2020 FINDINGS: Lower chest: Patchy infiltrates or atelectasis in the lung bases. Nonspecific pattern. Postoperative changes in the mediastinum. Cardiac enlargement. Dilated fluid-filled esophagus with patulous appearance of the esophagogastric junction. This likely represents reflux although dysmotility or achalasia could have this appearance. Hepatobiliary: The gallbladder is distended and contains a very large gallstone measuring up to 7.4 by 4.2 cm in diameter. Not significantly changed since prior study. There is moderate intra and extrahepatic bile duct dilatation. No common duct stones are identified. No focal liver lesions. Portal veins are patent. Pancreas: Unremarkable. No pancreatic ductal dilatation or surrounding inflammatory changes. Spleen: Focal low-attenuation lesion measuring 1.7 cm diameter. Similar appearance to previous study, likely hemangioma. Adrenals/Urinary Tract: No adrenal gland nodules. Bilateral renal parenchymal atrophy. Nephrograms are homogeneous. No hydronephrosis or hydroureter. Bladder is normal. Stomach/Bowel: Stomach is fluid distended. Duodenum and proximal jejunum are also demonstrating prominent fluid distention. The distal jejunum and ileum are decompressed with transition zone in the upper mid abdomen. Changes are consistent with high-grade small bowel obstruction. Surgical anastomosis in the ilium is well distal to the transition  zone. Colon is mostly decompressed. Significant diverticulosis of the sigmoid colon without evidence of diverticulitis. Vascular/Lymphatic: Prominent aortic calcification with calcific and noncalcific plaque formation. No aneurysm. No significant lymphadenopathy. Reproductive: Status post hysterectomy. No adnexal masses. Other: No free air or free fluid in the abdomen. Abdominal wall musculature appears intact. Musculoskeletal: Spondylolysis with mild spondylolisthesis at L4-5. Mild degenerative changes in the spine. No destructive bone lesions. IMPRESSION: 1. High-grade small bowel obstruction with transition zone in the upper mid abdomen. 2. Distended gallbladder with large gallstone. Moderate intra and extrahepatic bile duct dilatation. No common duct stones are identified. 3. Patchy infiltrates or  atelectasis in the lung bases. Cardiac enlargement. 4. Significant sigmoid diverticulosis without evidence of diverticulitis. 5. Prominent aortic calcification with calcific and noncalcific plaque formation. 6. Spondylolysis with mild spondylolisthesis at L4-5. 7. Aortic atherosclerosis. Aortic Atherosclerosis (ICD10-I70.0). Electronically Signed   By: Burman Nieves M.D.   On: 08/27/2020 19:57       Sharronda Schweers S Nehemias Sauceda   [facility] If 7PM-7AM, please contact night-coverage at [email or web address], Office  [phone]   08/28/2020, 2:04 PM  LOS: 1 day

## 2020-08-28 NOTE — Progress Notes (Signed)
Assessment & Plan: HD#2 Small bowel obstruction  Recurrent problem, likely due to adhesions from prior surgery  NG decompression, IV hydration  Will begin small bowel protocol this AM - orders written Acute respiratory failure with hypoxia  PAD (peripheral artery disease) (HCC) Normocytic anemia HTN  CKD (chronic kidney disease), stage III (HCC)  Cholelithiasis CAD (coronary artery disease) Alzheimer's dementia with behavioral disturbance (HCC) Type II DM  Will begin small bowel protocol.  Hopefully SBO will resolve with non-operative management as it has done previously.  No plans for acute surgical intervention.        Darnell Level, MD       [facility]       Office: [phone]   Chief Complaint: SBO  Subjective: Patient somnolent, arouses to voice.  Denies pain this AM.  Objective: Vital signs in last 24 hours: Temp:  [97.9 F (36.6 C)-98.6 F (37 C)] 97.9 F (36.6 C) (01/24 0415) Pulse Rate:  [75-88] 75 (01/24 0415) Resp:  [16-25] 16 (01/24 0415) BP: (158-197)/(58-95) 168/65 (01/24 0415) SpO2:  [89 %-100 %] 97 % (01/24 0415) Weight:  [48 kg-51.7 kg] 48 kg (01/23 2351)    Intake/Output from previous day: 01/23 0701 - 01/24 0700 In: 397.9 [I.V.:397.9] Out: 1850 [Urine:100; Emesis/NG output:1750] Intake/Output this shift: No intake/output data recorded.  Physical Exam: HEENT - sclerae clear, mucous membranes moist Neck - soft Abdomen - soft without distension; no mass; non-tender; NG with bilious   Lab Results:  Recent Labs    08/27/20 1803 08/28/20 0635  WBC 12.5* 8.5  HGB 10.5* 8.3*  HCT 34.2* 27.1*  PLT 133* 132*   BMET Recent Labs    08/27/20 1803 08/28/20 0635  NA 144 144  K 4.2 3.5  CL 102 107  CO2 29 25  GLUCOSE 191* 129*  BUN 33* 29*  CREATININE 1.27* 1.19*  CALCIUM 9.3 8.1*   PT/INR No results for input(s): LABPROT, INR in the last 72 hours. Comprehensive Metabolic Panel:    Component Value  Date/Time   NA 144 08/28/2020 0635   NA 144 08/27/2020 1803   K 3.5 08/28/2020 0635   K 4.2 08/27/2020 1803   CL 107 08/28/2020 0635   CL 102 08/27/2020 1803   CO2 25 08/28/2020 0635   CO2 29 08/27/2020 1803   BUN 29 (H) 08/28/2020 0635   BUN 33 (H) 08/27/2020 1803   CREATININE 1.19 (H) 08/28/2020 0635   CREATININE 1.27 (H) 08/27/2020 1803   GLUCOSE 129 (H) 08/28/2020 0635   GLUCOSE 191 (H) 08/27/2020 1803   CALCIUM 8.1 (L) 08/28/2020 0635   CALCIUM 9.3 08/27/2020 1803   AST 12 (L) 08/28/2020 0635   AST 17 08/27/2020 1803   ALT 7 08/28/2020 0635   ALT 8 08/27/2020 1803   ALKPHOS 45 08/28/2020 0635   ALKPHOS 64 08/27/2020 1803   BILITOT 0.5 08/28/2020 0635   BILITOT 0.7 08/27/2020 1803   PROT 5.3 (L) 08/28/2020 0635   PROT 7.0 08/27/2020 1803   ALBUMIN 2.9 (L) 08/28/2020 0635   ALBUMIN 3.6 08/27/2020 1803    Studies/Results: DG Chest 1 View  Result Date: 08/28/2020 CLINICAL DATA:  Nasogastric tube placement EXAM: CHEST  1 VIEW COMPARISON:  05/13/2020 FINDINGS: Nasogastric tube has been placed with its tip extending into the left mid abdomen overlying the expected mid body of the stomach. Lung volumes are small, but are symmetric and are clear. No pneumothorax or pleural effusion. Coronary artery bypass grafting has been  performed. Cardiac size is within normal limits. IMPRESSION: Nasogastric tube tip within the expected mid body of the stomach. Electronically Signed   By: Helyn Numbers MD   On: 08/28/2020 03:28   CT ABDOMEN PELVIS W CONTRAST  Result Date: 08/27/2020 CLINICAL DATA:  Right-sided abdominal pain, nausea, and vomiting. Previous history of small-bowel obstructions. EXAM: CT ABDOMEN AND PELVIS WITH CONTRAST TECHNIQUE: Multidetector CT imaging of the abdomen and pelvis was performed using the standard protocol following bolus administration of intravenous contrast. CONTRAST:  27mL OMNIPAQUE IOHEXOL 300 MG/ML  SOLN COMPARISON:  07/16/2020 FINDINGS: Lower chest: Patchy  infiltrates or atelectasis in the lung bases. Nonspecific pattern. Postoperative changes in the mediastinum. Cardiac enlargement. Dilated fluid-filled esophagus with patulous appearance of the esophagogastric junction. This likely represents reflux although dysmotility or achalasia could have this appearance. Hepatobiliary: The gallbladder is distended and contains a very large gallstone measuring up to 7.4 by 4.2 cm in diameter. Not significantly changed since prior study. There is moderate intra and extrahepatic bile duct dilatation. No common duct stones are identified. No focal liver lesions. Portal veins are patent. Pancreas: Unremarkable. No pancreatic ductal dilatation or surrounding inflammatory changes. Spleen: Focal low-attenuation lesion measuring 1.7 cm diameter. Similar appearance to previous study, likely hemangioma. Adrenals/Urinary Tract: No adrenal gland nodules. Bilateral renal parenchymal atrophy. Nephrograms are homogeneous. No hydronephrosis or hydroureter. Bladder is normal. Stomach/Bowel: Stomach is fluid distended. Duodenum and proximal jejunum are also demonstrating prominent fluid distention. The distal jejunum and ileum are decompressed with transition zone in the upper mid abdomen. Changes are consistent with high-grade small bowel obstruction. Surgical anastomosis in the ilium is well distal to the transition zone. Colon is mostly decompressed. Significant diverticulosis of the sigmoid colon without evidence of diverticulitis. Vascular/Lymphatic: Prominent aortic calcification with calcific and noncalcific plaque formation. No aneurysm. No significant lymphadenopathy. Reproductive: Status post hysterectomy. No adnexal masses. Other: No free air or free fluid in the abdomen. Abdominal wall musculature appears intact. Musculoskeletal: Spondylolysis with mild spondylolisthesis at L4-5. Mild degenerative changes in the spine. No destructive bone lesions. IMPRESSION: 1. High-grade small bowel  obstruction with transition zone in the upper mid abdomen. 2. Distended gallbladder with large gallstone. Moderate intra and extrahepatic bile duct dilatation. No common duct stones are identified. 3. Patchy infiltrates or atelectasis in the lung bases. Cardiac enlargement. 4. Significant sigmoid diverticulosis without evidence of diverticulitis. 5. Prominent aortic calcification with calcific and noncalcific plaque formation. 6. Spondylolysis with mild spondylolisthesis at L4-5. 7. Aortic atherosclerosis. Aortic Atherosclerosis (ICD10-I70.0). Electronically Signed   By: Burman Nieves M.D.   On: 08/27/2020 19:57      Darnell Level 08/28/2020  Patient ID: [place] M Walth, female   DOB: [birth year], 87 y.o.   MRN: [id number]

## 2020-08-29 ENCOUNTER — Inpatient Hospital Stay (HOSPITAL_COMMUNITY): Payer: Medicare Other

## 2020-08-29 DIAGNOSIS — I10 Essential (primary) hypertension: Secondary | ICD-10-CM | POA: Diagnosis not present

## 2020-08-29 DIAGNOSIS — K56609 Unspecified intestinal obstruction, unspecified as to partial versus complete obstruction: Secondary | ICD-10-CM | POA: Diagnosis not present

## 2020-08-29 DIAGNOSIS — I2510 Atherosclerotic heart disease of native coronary artery without angina pectoris: Secondary | ICD-10-CM | POA: Diagnosis not present

## 2020-08-29 DIAGNOSIS — E119 Type 2 diabetes mellitus without complications: Secondary | ICD-10-CM | POA: Diagnosis not present

## 2020-08-29 LAB — GLUCOSE, CAPILLARY
Glucose-Capillary: 100 mg/dL — ABNORMAL HIGH (ref 70–99)
Glucose-Capillary: 105 mg/dL — ABNORMAL HIGH (ref 70–99)
Glucose-Capillary: 82 mg/dL (ref 70–99)
Glucose-Capillary: 87 mg/dL (ref 70–99)
Glucose-Capillary: 88 mg/dL (ref 70–99)
Glucose-Capillary: 91 mg/dL (ref 70–99)

## 2020-08-29 LAB — BASIC METABOLIC PANEL
Anion gap: 12 (ref 5–15)
BUN: 21 mg/dL (ref 8–23)
CO2: 32 mmol/L (ref 22–32)
Calcium: 8.8 mg/dL — ABNORMAL LOW (ref 8.9–10.3)
Chloride: 103 mmol/L (ref 98–111)
Creatinine, Ser: 1.09 mg/dL — ABNORMAL HIGH (ref 0.44–1.00)
GFR, Estimated: 48 mL/min — ABNORMAL LOW (ref >60)
Glucose, Bld: 99 mg/dL (ref 70–99)
Potassium: 3.7 mmol/L (ref 3.5–5.1)
Sodium: 147 mmol/L — ABNORMAL HIGH (ref 135–145)

## 2020-08-29 LAB — URINE CULTURE

## 2020-08-29 LAB — CBC
HCT: 28.8 % — ABNORMAL LOW (ref 36.0–46.0)
Hemoglobin: 8.9 g/dL — ABNORMAL LOW (ref 12.0–15.0)
MCH: 27.1 pg (ref 26.0–34.0)
MCHC: 30.9 g/dL (ref 30.0–36.0)
MCV: 87.8 fL (ref 80.0–100.0)
Platelets: 132 10*3/uL — ABNORMAL LOW (ref 150–400)
RBC: 3.28 MIL/uL — ABNORMAL LOW (ref 3.87–5.11)
RDW: 16 % — ABNORMAL HIGH (ref 11.5–15.5)
WBC: 7.8 10*3/uL (ref 4.0–10.5)
nRBC: 0 % (ref 0.0–0.2)

## 2020-08-29 LAB — LEGIONELLA PNEUMOPHILA SEROGP 1 UR AG: L. pneumophila Serogp 1 Ur Ag: NEGATIVE

## 2020-08-29 MED ORDER — METOPROLOL TARTRATE 5 MG/5ML IV SOLN
2.5000 mg | Freq: Three times a day (TID) | INTRAVENOUS | Status: DC
  Administered 2020-08-29 – 2020-08-31 (×6): 2.5 mg via INTRAVENOUS
  Filled 2020-08-29 (×6): qty 5

## 2020-08-29 NOTE — Consult Note (Signed)
   [facility] Inpatient Consult   08/29/2020  [place] M Breth 08/26/1925 [id number]   Patient is currently active with [facility]) Care Management for chronic disease management services.   Plan: Will continue to follow for progression and disposition plans.  Of note, [facility] Care Management services does not replace or interfere with any services that are arranged by inpatient case management or social work.   Christophe Louis, MSN, RN [facility] Liaison Nurse Mobile Phone [phone]  Toll free office [phone]

## 2020-08-29 NOTE — Progress Notes (Signed)
Physical Therapy Treatment Patient Details Name: [place] M Robley MRN: [id number] DOB: Jul 07, 1926 Today's Date: 08/29/2020    History of Present Illness Patient is a 87 y.o. female who presented to [facility] with Rt abdominal pain, nausea, and emesis with PMH significant for HTN, HLD, CAD s/p CABG, DMII, MI, OA, small bowel obstruction with history of resection.    PT Comments    Pt tolerated increased ambulation distance of 150' with her straight cane, she had no loss of balance. Performed seated BUE/LE strengthening exercises. Pt is pleasantly confused, she is able to follow 1 step commands with increased time.    Follow Up Recommendations  Home health PT;Supervision for mobility/OOB;Supervision/Assistance - 24 hour     Equipment Recommendations  None recommended by PT    Recommendations for Other Services       Precautions / Restrictions Precautions Precautions: Fall Precaution Comments: NGT Restrictions Weight Bearing Restrictions: No    Mobility  Bed Mobility Overal bed mobility: Needs Assistance Bed Mobility: Supine to Sit     Supine to sit: Min guard;HOB elevated        Transfers Overall transfer level: Needs assistance Equipment used: None Transfers: Sit to/from Stand Sit to Stand: Supervision         General transfer comment: supervision for safety  Ambulation/Gait Ambulation/Gait assistance: Min guard Gait Distance (Feet): 150 Feet Assistive device: Straight cane Gait Pattern/deviations: Step-through pattern;Decreased step length - right;Decreased step length - left;Decreased stride length Gait velocity: decr   General Gait Details: steady with ambulation, no loss of balance   Stairs             Wheelchair Mobility    Modified Rankin (Stroke Patients Only)       Balance Overall balance assessment: Needs assistance Sitting-balance support: No upper extremity supported;Feet supported Sitting balance-Leahy Scale: Good      Standing balance support: Single extremity supported Standing balance-Leahy Scale: Fair                              Cognition Arousal/Alertness: Awake/alert Behavior During Therapy: Flat affect Overall Cognitive Status: History of cognitive impairments - at baseline Area of Impairment: Following commands                       Following Commands: Follows one step commands inconsistently;Follows one step commands with increased time       General Comments: pt pleasantly confused, answered most questions with "OK"      Exercises General Exercises - Upper Extremity Shoulder Flexion: AROM;Both;10 reps;Seated General Exercises - Lower Extremity Long Arc Quad: AROM;Both;10 reps;Seated Hip Flexion/Marching: AROM;Both;10 reps;Seated    General Comments        Pertinent Vitals/Pain Pain Assessment: No/denies pain    Home Living                      Prior Function            PT Goals (current goals can now be found in the care plan section) Acute Rehab PT Goals Patient Stated Goal: husband would like patient home and continue with [facility] services PT Goal Formulation: With family Time For Goal Achievement: 09/11/20 Potential to Achieve Goals: Good Progress towards PT goals: Progressing toward goals    Frequency    Min 3X/week      PT Plan Current plan remains appropriate    Co-evaluation  AM-PAC PT "6 Clicks" Mobility   Outcome Measure  Help needed turning from your back to your side while in a flat bed without using bedrails?: A Little Help needed moving from lying on your back to sitting on the side of a flat bed without using bedrails?: A Little Help needed moving to and from a bed to a chair (including a wheelchair)?: A Little Help needed standing up from a chair using your arms (e.g., wheelchair or bedside chair)?: A Little Help needed to walk in hospital room?: A Little Help needed climbing 3-5 steps with a  railing? : A Little 6 Click Score: 18    End of Session Equipment Utilized During Treatment: Gait belt Activity Tolerance: Patient tolerated treatment well Patient left: in chair;with call bell/phone within reach;with chair alarm set Nurse Communication: Mobility status PT Visit Diagnosis: Muscle weakness (generalized) (M62.81);Difficulty in walking, not elsewhere classified (R26.2)     Time: 4270-6237 PT Time Calculation (min) (ACUTE ONLY): 20 min  Charges:  $Gait Training: 8-22 mins                     Ralene Bathe Kistler PT 08/29/2020  Acute Rehabilitation Services Pager [phone] Office [phone]

## 2020-08-29 NOTE — Progress Notes (Signed)
Initial Nutrition Assessment  DOCUMENTATION CODES:   Severe malnutrition in context of chronic illness  INTERVENTION:   -Will monitor for diet advancement  Once diet advanced: -Ensure Enlive po BID, each supplement provides 350 kcal and 20 grams of protein  If diet unable to be advanced by LOS day 7, recommend consideration of TPN if within GOC.  NUTRITION DIAGNOSIS:   Severe Malnutrition related to chronic illness (Alzheimer's dementia, recurrent SBOs) as evidenced by percent weight loss,severe fat depletion,severe muscle depletion.  GOAL:   Patient will meet greater than or equal to 90% of their needs  MONITOR:   Diet advancement,Labs,Weight trends,I & O's, GOC  REASON FOR ASSESSMENT:   Consult Assessment of nutrition requirement/status  ASSESSMENT:   87 y.o. female with medical history significant of SBO, CKD 3 b, dementia, HLD, DM2, CAD, HTN  Admitted for SBO and sepsis possibly pulmonary source.  Patient unable to provide history given Alzheimer's dementia. Alert/oriented x 1. When asked any question she replies "very well, thank you".  Pt with NGT placed, set to suction. Output noted at bedside: 800 ml. Per surgery, recommend SBO protocol. No plans for surgery at this time. Still observing.   Per chart review, pt was having N/V/D with abdominal pain for 2-3 days PTA.   Per weight records, pt has lost 36 lbs since 08/27/19 (25% wt loss x 1 year, significant for time frame).   Medications: Lactated ringers  Labs reviewed: CBGs: 82-105 Elevated Na  NUTRITION - FOCUSED PHYSICAL EXAM:  Flowsheet Row Most Recent Value  Orbital Region Moderate depletion  Upper Arm Region Severe depletion  Thoracic and Lumbar Region Moderate depletion  Buccal Region Severe depletion  Temple Region Severe depletion  Clavicle Bone Region Severe depletion  Clavicle and Acromion Bone Region Moderate depletion  Scapular Bone Region Severe depletion  Dorsal Hand No depletion   [mitts]  Patellar Region Unable to assess  Anterior Thigh Region Unable to assess  Posterior Calf Region Unable to assess  Edema (RD Assessment) None  Hair Reviewed  Mouth Reviewed       Diet Order:   Diet Order            Diet NPO time specified  Diet effective now                 EDUCATION NEEDS:   Not appropriate for education at this time  Skin:  Skin Assessment: Reviewed RN Assessment  Last BM:  PTA  Height:   Ht Readings from Last 1 Encounters:  08/27/20 5\' 2"  (1.575 m)    Weight:   Wt Readings from Last 1 Encounters:  08/27/20 48 kg   BMI:  Body mass index is 19.35 kg/m.  Estimated Nutritional Needs:   Kcal:  1400-1600  Protein:  65-75g  Fluid:  1.6L/day  08/29/20, MS, RD, LDN Inpatient Clinical Dietitian Contact information available via Amion

## 2020-08-29 NOTE — Progress Notes (Addendum)
Subjective: CC: Patient is orientated to self. She says "I don't know" when asking orientation questions for time, place and situation.  She denies abdominal pain, n/v, flatus or bm. NGT with 650cc of bilious output. She is unsure if she got oob yesterday. There is a note from PT yesterday that she mobilized 100 ft with min assist and 1 person hand held/straight cane ambulation.   Objective: Vital signs in last 24 hours: Temp:  [98.4 F (36.9 C)-98.7 F (37.1 C)] 98.4 F (36.9 C) (01/25 0443) Pulse Rate:  [69-72] 69 (01/25 0443) Resp:  [14] 14 (01/25 0443) BP: (171-191)/(57-69) 191/63 (01/25 0443) SpO2:  [89 %-99 %] 99 % (01/24 2039)    Intake/Output from previous day: 01/24 0701 - 01/25 0700 In: 614.4 [I.V.:274.4; NG/GT:90; IV Piggyback:250] Out: 1250 [Urine:400; Emesis/NG output:850] Intake/Output this shift: No intake/output data recorded.  PE: Gen:  Frail elderly female who is in NAD Pulm:  Normal rate and effort  Abd: Soft, ND, mild generalized left sided tenderness, hypoactive bs. NGT with 650cc of bilious like output. No RUQ tenderness. Ext:  No LE edema  Psych: A&Ox1 Skin: no rashes noted, warm and dry  Lab Results:  Recent Labs    08/28/20 0635 08/29/20 0814  WBC 8.5 7.8  HGB 8.3* 8.9*  HCT 27.1* 28.8*  PLT 132* 132*   BMET Recent Labs    08/28/20 0635 08/29/20 0814  NA 144 147*  K 3.5 3.7  CL 107 103  CO2 25 32  GLUCOSE 129* 99  BUN 29* 21  CREATININE 1.19* 1.09*  CALCIUM 8.1* 8.8*   PT/INR No results for input(s): LABPROT, INR in the last 72 hours. CMP     Component Value Date/Time   NA 147 (H) 08/29/2020 0814   K 3.7 08/29/2020 0814   CL 103 08/29/2020 0814   CO2 32 08/29/2020 0814   GLUCOSE 99 08/29/2020 0814   BUN 21 08/29/2020 0814   CREATININE 1.09 (H) 08/29/2020 0814   CALCIUM 8.8 (L) 08/29/2020 0814   PROT 5.3 (L) 08/28/2020 0635   ALBUMIN 2.9 (L) 08/28/2020 0635   AST 12 (L) 08/28/2020 0635   ALT 7 08/28/2020 0635    ALKPHOS 45 08/28/2020 0635   BILITOT 0.5 08/28/2020 0635   GFRNONAA 48 (L) 08/29/2020 0814   GFRAA 33 (L) 10/11/2019 0321   Lipase     Component Value Date/Time   LIPASE 20 07/15/2020 1719       Studies/Results: DG Chest 1 View  Result Date: 08/28/2020 CLINICAL DATA:  Nasogastric tube placement EXAM: CHEST  1 VIEW COMPARISON:  05/13/2020 FINDINGS: Nasogastric tube has been placed with its tip extending into the left mid abdomen overlying the expected mid body of the stomach. Lung volumes are small, but are symmetric and are clear. No pneumothorax or pleural effusion. Coronary artery bypass grafting has been performed. Cardiac size is within normal limits. IMPRESSION: Nasogastric tube tip within the expected mid body of the stomach. Electronically Signed   By: Helyn Numbers MD   On: 08/28/2020 03:28   CT ABDOMEN PELVIS W CONTRAST  Result Date: 08/27/2020 CLINICAL DATA:  Right-sided abdominal pain, nausea, and vomiting. Previous history of small-bowel obstructions. EXAM: CT ABDOMEN AND PELVIS WITH CONTRAST TECHNIQUE: Multidetector CT imaging of the abdomen and pelvis was performed using the standard protocol following bolus administration of intravenous contrast. CONTRAST:  109mL OMNIPAQUE IOHEXOL 300 MG/ML  SOLN COMPARISON:  07/16/2020 FINDINGS: Lower chest: Patchy infiltrates or atelectasis in  the lung bases. Nonspecific pattern. Postoperative changes in the mediastinum. Cardiac enlargement. Dilated fluid-filled esophagus with patulous appearance of the esophagogastric junction. This likely represents reflux although dysmotility or achalasia could have this appearance. Hepatobiliary: The gallbladder is distended and contains a very large gallstone measuring up to 7.4 by 4.2 cm in diameter. Not significantly changed since prior study. There is moderate intra and extrahepatic bile duct dilatation. No common duct stones are identified. No focal liver lesions. Portal veins are patent. Pancreas:  Unremarkable. No pancreatic ductal dilatation or surrounding inflammatory changes. Spleen: Focal low-attenuation lesion measuring 1.7 cm diameter. Similar appearance to previous study, likely hemangioma. Adrenals/Urinary Tract: No adrenal gland nodules. Bilateral renal parenchymal atrophy. Nephrograms are homogeneous. No hydronephrosis or hydroureter. Bladder is normal. Stomach/Bowel: Stomach is fluid distended. Duodenum and proximal jejunum are also demonstrating prominent fluid distention. The distal jejunum and ileum are decompressed with transition zone in the upper mid abdomen. Changes are consistent with high-grade small bowel obstruction. Surgical anastomosis in the ilium is well distal to the transition zone. Colon is mostly decompressed. Significant diverticulosis of the sigmoid colon without evidence of diverticulitis. Vascular/Lymphatic: Prominent aortic calcification with calcific and noncalcific plaque formation. No aneurysm. No significant lymphadenopathy. Reproductive: Status post hysterectomy. No adnexal masses. Other: No free air or free fluid in the abdomen. Abdominal wall musculature appears intact. Musculoskeletal: Spondylolysis with mild spondylolisthesis at L4-5. Mild degenerative changes in the spine. No destructive bone lesions. IMPRESSION: 1. High-grade small bowel obstruction with transition zone in the upper mid abdomen. 2. Distended gallbladder with large gallstone. Moderate intra and extrahepatic bile duct dilatation. No common duct stones are identified. 3. Patchy infiltrates or atelectasis in the lung bases. Cardiac enlargement. 4. Significant sigmoid diverticulosis without evidence of diverticulitis. 5. Prominent aortic calcification with calcific and noncalcific plaque formation. 6. Spondylolysis with mild spondylolisthesis at L4-5. 7. Aortic atherosclerosis. Aortic Atherosclerosis (ICD10-I70.0). Electronically Signed   By: Burman Nieves M.D.   On: 08/27/2020 19:57   DG Abd  Portable 1V  Result Date: 08/29/2020 CLINICAL DATA:  NG tube position EXAM: PORTABLE ABDOMEN - 1 VIEW COMPARISON:  08/28/2020 FINDINGS: Enteric tube tip is in the left upper quadrant consistent with location in the body of the stomach. Residual contrast material in visualized small bowel with small bowel distention. IMPRESSION: Enteric tube tip is in the left upper quadrant consistent with location in the body of the stomach. Electronically Signed   By: Burman Nieves M.D.   On: 08/29/2020 02:43   DG Abd Portable 1V-Small Bowel Obstruction Protocol-initial, 8 hr delay  Result Date: 08/28/2020 CLINICAL DATA:  Small bowel obstruction EXAM: PORTABLE ABDOMEN - 1 VIEW COMPARISON:  CT earlier today FINDINGS: Contrast seen within the stomach and dilated proximal small bowel loops compatible with high-grade small bowel obstruction. NG tube is in the stomach. Contrast material within the urinary bladder from prior CT. No organomegaly. IMPRESSION: High-grade small bowel obstruction. Electronically Signed   By: Charlett Nose M.D.   On: 08/28/2020 19:04    Anti-infectives: Anti-infectives (From admission, onward)   Start     Dose/Rate Route Frequency Ordered Stop   08/28/20 2100  cefTRIAXone (ROCEPHIN) 2 g in sodium chloride 0.9 % 100 mL IVPB        2 g 200 mL/hr over 30 Minutes Intravenous Every 24 hours 08/27/20 2211     08/28/20 0600  metroNIDAZOLE (FLAGYL) IVPB 500 mg        500 mg 100 mL/hr over 60 Minutes Intravenous Every 8 hours 08/27/20  2211     08/27/20 2100  cefTRIAXone (ROCEPHIN) 2 g in sodium chloride 0.9 % 100 mL IVPB       "And" Linked Group Details   2 g 200 mL/hr over 30 Minutes Intravenous  Once 08/27/20 2052 08/27/20 2121   08/27/20 2100  metroNIDAZOLE (FLAGYL) IVPB 500 mg       "And" Linked Group Details   500 mg 100 mL/hr over 60 Minutes Intravenous  Once 08/27/20 2052 08/27/20 2154       Assessment/Plan Acute respiratory failure with hypoxia PAD (peripheral artery disease)  (HCC) Normocytic anemia HTN CKD (chronic kidney disease), stage III (HCC) Cholelithiasis CAD(coronary artery disease) Alzheimer's dementiawith behavioral disturbance (HCC) Type II DM Gallstones - CT w/ distended gallbladder with large gallstone. Moderate intra and extrahepatic bile duct dilatation. No common duct stones are identified. LFT wnl 1/24. No RUQ tenderness.  Small bowel obstruction             CT with high-grade small bowel obstruction with transition zone in the upper mid abdomen.  Recurrent problem, likely due to adhesions from prior surgery             Continue bowel rest, NG decompression, IV hydration             Xray with contrast in the small bowel with small bowel distension this AM.   Xray in AM  Long term GOC needs to be discussed.  This is recurrent and at some point (even this admission) surgery might be indicated. Will discuss with MD  Keep K >4 and Mg > 2 for bowel function  Cont to mobilize for bowel funciton   FEN - NPO, NGT, IVF VTE - SCDs, okay for chemical prophylaxis from a general surgery standpoint  ID - Rocephin/Flagyl. Unclear why she is on this. WBC 7.8. Afebrile.    LOS: 2 days    Jacinto Halim , [facility] Surgery 08/29/2020, 9:53 AM Please see Amion for pager number during day hours 7:00am-4:30pm

## 2020-08-29 NOTE — Progress Notes (Signed)
[facility]  PROGRESS NOTE  [place] EAV:[id number] DOB: August 13, 1925 DOA: 08/27/2020 PCP: Alysia PennaHolwerda, Scott, MD   Brief HPI:   87 year old female with medical history of small bowel obstruction, CKD stage IIIb, dementia, hyperlipidemia, diabetes mellitus type 2, CAD, hypertension presented with abdominal discomfort, nausea vomiting.  She has prior history of SBO.  In the ED CT scan showed high-grade small bowel obstruction, distended gallbladder with large gallstone patchy infiltrates at bases.  Cannot rule out achalasia.  General surgery was consulted, NG tube was inserted.    Subjective   Patient seen and examined, NG tube in place to wall suction.   Assessment/Plan:     1. Small bowel obstruction-NG tube inserted, general surgery has seen the patient, small bowel protocol showed contrast in small bowel with small bowel distention.  General surgery observing for next 24 hours, if no improvement she might need surgery. 2. Acute hypoxemic respiratory failure-patient presented with acute hypoxemic respiratory failure, O2 sats was 90% on room air.  She was noted to have sats of 88 to 89% on room air, likely from pneumonia noted on CT abdomen.  Patient is currently on ceftriaxone and Flagyl.  Zithromax added for atypical coverage.   3. Peripheral arterial disease-chronic, stable. 4. Normocytic anemia-chronic, continue to follow. 5. Hypertension-we will start hydralazine 10 mg IV every 4 hours as needed.  Start scheduled metoprolol 2.5 mg IV every 8 hours. 6. Cholelithiasis-patient has known history of gallstones involving her gallbladder.  LFTs are within normal limits. 7. Diabetes mellitus type 2-CBG well controlled.  Continue sliding scale insulin with NovoLog. Check CBG every 4 hours. 8. CAD-stable, home meds on hold as patient is n.p.o.     COVID-19 Labs  No results for input(s): DDIMER, FERRITIN, LDH, CRP in the last 72 hours.  Lab Results  Component Value Date    SARSCOV2NAA NEGATIVE 08/27/2020   SARSCOV2NAA NEGATIVE 07/16/2020   SARSCOV2NAA NEGATIVE 05/13/2020   SARSCOV2NAA NEGATIVE 10/11/2019     Scheduled medications:   . insulin aspart  0-9 Units Subcutaneous Q4H  . metoprolol tartrate  2.5 mg Intravenous Q8H         CBG: Recent Labs  Lab 08/28/20 2037 08/29/20 0034 08/29/20 0356 08/29/20 0740 08/29/20 1204  GLUCAP 104* 100* 105* 82 88    SpO2: 99 % O2 Flow Rate (L/min): 2 L/min    CBC: Recent Labs  Lab 08/27/20 1803 08/28/20 0635 08/29/20 0814  WBC 12.5* 8.5 7.8  NEUTROABS 11.2* 6.9  --   HGB 10.5* 8.3* 8.9*  HCT 34.2* 27.1* 28.8*  MCV 86.4 87.1 87.8  PLT 133* 132* 132*    Basic Metabolic Panel: Recent Labs  Lab 08/27/20 1803 08/28/20 0635 08/29/20 0814  NA 144 144 147*  K 4.2 3.5 3.7  CL 102 107 103  CO2 29 25 32  GLUCOSE 191* 129* 99  BUN 33* 29* 21  CREATININE 1.27* 1.19* 1.09*  CALCIUM 9.3 8.1* 8.8*  MG 1.8 1.7  --   PHOS 4.1 3.2  --      Liver Function Tests: Recent Labs  Lab 08/27/20 1803 08/28/20 0635  AST 17 12*  ALT 8 7  ALKPHOS 64 45  BILITOT 0.7 0.5  PROT 7.0 5.3*  ALBUMIN 3.6 2.9*     Antibiotics: Anti-infectives (From admission, onward)   Start     Dose/Rate Route Frequency Ordered Stop   08/28/20 2100  cefTRIAXone (ROCEPHIN) 2 g in sodium chloride 0.9 % 100 mL IVPB  2 g 200 mL/hr over 30 Minutes Intravenous Every 24 hours 08/27/20 2211     08/28/20 0600  metroNIDAZOLE (FLAGYL) IVPB 500 mg        500 mg 100 mL/hr over 60 Minutes Intravenous Every 8 hours 08/27/20 2211     08/27/20 2100  cefTRIAXone (ROCEPHIN) 2 g in sodium chloride 0.9 % 100 mL IVPB       "And" Linked Group Details   2 g 200 mL/hr over 30 Minutes Intravenous  Once 08/27/20 2052 08/27/20 2121   08/27/20 2100  metroNIDAZOLE (FLAGYL) IVPB 500 mg       "And" Linked Group Details   500 mg 100 mL/hr over 60 Minutes Intravenous  Once 08/27/20 2052 08/27/20 2154       DVT prophylaxis:  SCDs  Code Status: DNR  Family Communication: No family at bedside   Consultants:  General surgery  Procedures:      Objective   Vitals:   08/28/20 1035 08/28/20 1419 08/28/20 2039 08/29/20 0443  BP: (!) 171/69 (!) 188/63 (!) 184/57 (!) 191/63  Pulse: 70 72 69 69  Resp: 14  14 14   Temp: 98.6 F (37 C)  98.7 F (37.1 C) 98.4 F (36.9 C)  TempSrc: Oral  Oral Oral  SpO2: (!) 89% 94% 99%   Weight:      Height:        Intake/Output Summary (Last 24 hours) at 08/29/2020 1453 Last data filed at 08/29/2020 1221 Gross per 24 hour  Intake 614.42 ml  Output 2250 ml  Net -1635.58 ml    01/23 1901 - 01/25 0700 In: 1012.3 [I.V.:672.3] Out: 3100 [Urine:500]  Filed Weights   08/27/20 1610 08/27/20 2351  Weight: 51.7 kg 48 kg    Physical Examination:   General-appears in no acute distress, NG tube in place  Heart-S1-S2, regular, no murmur auscultated  Lungs-clear to auscultation bilaterally, no wheezing or crackles auscultated  Abdomen-soft, nontender, no organomegaly  Extremities-no edema in the lower extremities  Neuro-alert, oriented x3, no focal deficit noted   Status is: Inpatient  Dispo: The patient is from: Home              Anticipated d/c is to: Home              Anticipated d/c date is: 08/31/2020              Patient currently not medically stable for discharge  Barrier to discharge-ongoing treatment for small bowel obstruction       Data Reviewed:   Recent Results (from the past 240 hour(s))  SARS Coronavirus 2 by RT PCR (hospital order, performed in [facility] hospital lab) Nasopharyngeal Nasopharyngeal Swab     Status: None   Collection Time: 08/27/20  4:21 PM   Specimen: Nasopharyngeal Swab  Result Value Ref Range Status   SARS Coronavirus 2 NEGATIVE NEGATIVE Final    Comment: (NOTE) SARS-CoV-2 target nucleic acids are NOT DETECTED.  The SARS-CoV-2 RNA is generally detectable in upper and lower respiratory specimens during the  acute phase of infection. The lowest concentration of SARS-CoV-2 viral copies this assay can detect is 250 copies / mL. A negative result does not preclude SARS-CoV-2 infection and should not be used as the sole basis for treatment or other patient management decisions.  A negative result may occur with improper specimen collection / handling, submission of specimen other than nasopharyngeal swab, presence of viral mutation(s) within the areas targeted by this assay, and inadequate number  of viral copies (<250 copies / mL). A negative result must be combined with clinical observations, patient history, and epidemiological information.  Fact Sheet for Patients:   BoilerBrush.com.cy  Fact Sheet for Healthcare Providers: https://pope.com/  This test is not yet approved or  cleared by the [place] FDA and has been authorized for detection and/or diagnosis of SARS-CoV-2 by FDA under an Emergency Use Authorization (EUA).  This EUA will remain in effect (meaning this test can be used) for the duration of the COVID-19 declaration under Section 564(b)(1) of the Act, 21 U.S.C. section 360bbb-3(b)(1), unless the authorization is terminated or revoked sooner.  Performed at [facility], [address], [place], [place] [address]   Blood culture (routine x 2)     Status: None (Preliminary result)   Collection Time: 08/27/20  6:03 PM   Specimen: BLOOD  Result Value Ref Range Status   Specimen Description   Final    BLOOD RIGHT ARM Performed at [facility], [address], [place], [place] [address]    Special Requests   Final    BOTTLES DRAWN AEROBIC AND ANAEROBIC Blood Culture adequate volume Performed at [facility], [address] [place], [place] [address]    Culture   Final    NO GROWTH < 24 HOURS Performed at [facility], [address] [place], [place] [address]     Report Status PENDING  Incomplete  Blood culture (routine x 2)     Status: None (Preliminary result)   Collection Time: 08/27/20  7:55 PM   Specimen: BLOOD  Result Value Ref Range Status   Specimen Description   Final    BLOOD RIGHT ARM Performed at [facility], [address], [place], [place] [address]    Special Requests   Final    BOTTLES DRAWN AEROBIC AND ANAEROBIC Blood Culture adequate volume Performed at [facility], [address], [place], [place] [address]    Culture   Final    NO GROWTH < 24 HOURS Performed at [facility], [address] [place], [place] [address]    Report Status PENDING  Incomplete  Urine culture     Status: Abnormal   Collection Time: 08/27/20 11:53 PM   Specimen: Urine, Random  Result Value Ref Range Status   Specimen Description   Final    URINE, RANDOM Performed at [facility], [address] [place], [place] [address]    Special Requests   Final    NONE Performed at [facility], [address] [place], [place] [address]    Culture MULTIPLE SPECIES PRESENT, SUGGEST RECOLLECTION (A)  Final   Report Status 08/29/2020 FINAL  Final    No results for input(s): LIPASE, AMYLASE in the last 168 hours. No results for input(s): AMMONIA in the last 168 hours.  Cardiac Enzymes: Recent Labs  Lab 08/27/20 1803  CKTOTAL 48   BNP (last 3 results) No results for input(s): BNP in the last 8760 hours.  ProBNP (last 3 results) No results for input(s): PROBNP in the last 8760 hours.  Studies:  DG Chest 1 View  Result Date: 08/28/2020 CLINICAL DATA:  Nasogastric tube placement EXAM: CHEST  1 VIEW COMPARISON:  05/13/2020 FINDINGS: Nasogastric tube has been placed with its tip extending into the left mid abdomen overlying the expected mid body of the stomach. Lung volumes are small, but are symmetric and are clear. No  pneumothorax or pleural effusion. Coronary  artery bypass grafting has been performed. Cardiac size is within normal limits. IMPRESSION: Nasogastric tube tip within the expected mid body of the stomach. Electronically Signed   By: Helyn NumbersAshesh  Parikh MD   On: 08/28/2020 03:28   CT ABDOMEN PELVIS W CONTRAST  Result Date: 08/27/2020 CLINICAL DATA:  Right-sided abdominal pain, nausea, and vomiting. Previous history of small-bowel obstructions. EXAM: CT ABDOMEN AND PELVIS WITH CONTRAST TECHNIQUE: Multidetector CT imaging of the abdomen and pelvis was performed using the standard protocol following bolus administration of intravenous contrast. CONTRAST:  75mL OMNIPAQUE IOHEXOL 300 MG/ML  SOLN COMPARISON:  07/16/2020 FINDINGS: Lower chest: Patchy infiltrates or atelectasis in the lung bases. Nonspecific pattern. Postoperative changes in the mediastinum. Cardiac enlargement. Dilated fluid-filled esophagus with patulous appearance of the esophagogastric junction. This likely represents reflux although dysmotility or achalasia could have this appearance. Hepatobiliary: The gallbladder is distended and contains a very large gallstone measuring up to 7.4 by 4.2 cm in diameter. Not significantly changed since prior study. There is moderate intra and extrahepatic bile duct dilatation. No common duct stones are identified. No focal liver lesions. Portal veins are patent. Pancreas: Unremarkable. No pancreatic ductal dilatation or surrounding inflammatory changes. Spleen: Focal low-attenuation lesion measuring 1.7 cm diameter. Similar appearance to previous study, likely hemangioma. Adrenals/Urinary Tract: No adrenal gland nodules. Bilateral renal parenchymal atrophy. Nephrograms are homogeneous. No hydronephrosis or hydroureter. Bladder is normal. Stomach/Bowel: Stomach is fluid distended. Duodenum and proximal jejunum are also demonstrating prominent fluid distention. The distal jejunum and ileum are decompressed with transition zone in the upper mid abdomen. Changes are  consistent with high-grade small bowel obstruction. Surgical anastomosis in the ilium is well distal to the transition zone. Colon is mostly decompressed. Significant diverticulosis of the sigmoid colon without evidence of diverticulitis. Vascular/Lymphatic: Prominent aortic calcification with calcific and noncalcific plaque formation. No aneurysm. No significant lymphadenopathy. Reproductive: Status post hysterectomy. No adnexal masses. Other: No free air or free fluid in the abdomen. Abdominal wall musculature appears intact. Musculoskeletal: Spondylolysis with mild spondylolisthesis at L4-5. Mild degenerative changes in the spine. No destructive bone lesions. IMPRESSION: 1. High-grade small bowel obstruction with transition zone in the upper mid abdomen. 2. Distended gallbladder with large gallstone. Moderate intra and extrahepatic bile duct dilatation. No common duct stones are identified. 3. Patchy infiltrates or atelectasis in the lung bases. Cardiac enlargement. 4. Significant sigmoid diverticulosis without evidence of diverticulitis. 5. Prominent aortic calcification with calcific and noncalcific plaque formation. 6. Spondylolysis with mild spondylolisthesis at L4-5. 7. Aortic atherosclerosis. Aortic Atherosclerosis (ICD10-I70.0). Electronically Signed   By: Burman NievesWilliam  Stevens M.D.   On: 08/27/2020 19:57   DG Abd Portable 1V  Result Date: 08/29/2020 CLINICAL DATA:  NG tube position EXAM: PORTABLE ABDOMEN - 1 VIEW COMPARISON:  08/28/2020 FINDINGS: Enteric tube tip is in the left upper quadrant consistent with location in the body of the stomach. Residual contrast material in visualized small bowel with small bowel distention. IMPRESSION: Enteric tube tip is in the left upper quadrant consistent with location in the body of the stomach. Electronically Signed   By: Burman NievesWilliam  Stevens M.D.   On: 08/29/2020 02:43   DG Abd Portable 1V-Small Bowel Obstruction Protocol-initial, 8 hr delay  Result Date:  08/28/2020 CLINICAL DATA:  Small bowel obstruction EXAM: PORTABLE ABDOMEN - 1 VIEW COMPARISON:  CT earlier today FINDINGS: Contrast seen within the stomach and dilated proximal small bowel loops compatible with high-grade small bowel obstruction. NG tube is in the stomach. Contrast  material within the urinary bladder from prior CT. No organomegaly. IMPRESSION: High-grade small bowel obstruction. Electronically Signed   By: Charlett Nose M.D.   On: 08/28/2020 19:04       Trentan Trippe S Jozelyn Kuwahara   [facility] If 7PM-7AM, please contact night-coverage at [email or web address], Office  [phone]   08/29/2020, 2:53 PM  LOS: 2 days

## 2020-08-29 NOTE — Evaluation (Signed)
SLP Cancellation Note  Patient Details Name: Sonya Chavez MRN: [id number] DOB: 01-08-1926   Cancelled treatment:       Reason Eval/Treat Not Completed: Other (comment);Medical issues which prohibited therapy (pt remains with NG with output, will continue efforts)  Rolena Infante, MS [facility] SLP Acute Rehab Services Office [phone] Pager [phone]   Chales Abrahams 08/29/2020, 9:22 AM

## 2020-08-30 ENCOUNTER — Inpatient Hospital Stay (HOSPITAL_COMMUNITY): Payer: Medicare Other

## 2020-08-30 DIAGNOSIS — A419 Sepsis, unspecified organism: Secondary | ICD-10-CM | POA: Diagnosis not present

## 2020-08-30 DIAGNOSIS — R6520 Severe sepsis without septic shock: Secondary | ICD-10-CM | POA: Diagnosis not present

## 2020-08-30 DIAGNOSIS — J9601 Acute respiratory failure with hypoxia: Secondary | ICD-10-CM | POA: Diagnosis not present

## 2020-08-30 DIAGNOSIS — K56609 Unspecified intestinal obstruction, unspecified as to partial versus complete obstruction: Secondary | ICD-10-CM | POA: Diagnosis not present

## 2020-08-30 LAB — GLUCOSE, CAPILLARY
Glucose-Capillary: 120 mg/dL — ABNORMAL HIGH (ref 70–99)
Glucose-Capillary: 126 mg/dL — ABNORMAL HIGH (ref 70–99)
Glucose-Capillary: 68 mg/dL — ABNORMAL LOW (ref 70–99)
Glucose-Capillary: 78 mg/dL (ref 70–99)
Glucose-Capillary: 81 mg/dL (ref 70–99)
Glucose-Capillary: 86 mg/dL (ref 70–99)
Glucose-Capillary: 97 mg/dL (ref 70–99)

## 2020-08-30 LAB — BASIC METABOLIC PANEL
Anion gap: 16 — ABNORMAL HIGH (ref 5–15)
BUN: 22 mg/dL (ref 8–23)
CO2: 29 mmol/L (ref 22–32)
Calcium: 8.7 mg/dL — ABNORMAL LOW (ref 8.9–10.3)
Chloride: 99 mmol/L (ref 98–111)
Creatinine, Ser: 0.99 mg/dL (ref 0.44–1.00)
GFR, Estimated: 53 mL/min — ABNORMAL LOW (ref >60)
Glucose, Bld: 72 mg/dL (ref 70–99)
Potassium: 3.4 mmol/L — ABNORMAL LOW (ref 3.5–5.1)
Sodium: 144 mmol/L (ref 135–145)

## 2020-08-30 LAB — CBC
HCT: 28.8 % — ABNORMAL LOW (ref 36.0–46.0)
Hemoglobin: 8.8 g/dL — ABNORMAL LOW (ref 12.0–15.0)
MCH: 26.3 pg (ref 26.0–34.0)
MCHC: 30.6 g/dL (ref 30.0–36.0)
MCV: 86 fL (ref 80.0–100.0)
Platelets: 135 10*3/uL — ABNORMAL LOW (ref 150–400)
RBC: 3.35 MIL/uL — ABNORMAL LOW (ref 3.87–5.11)
RDW: 15.7 % — ABNORMAL HIGH (ref 11.5–15.5)
WBC: 8.5 10*3/uL (ref 4.0–10.5)
nRBC: 0 % (ref 0.0–0.2)

## 2020-08-30 LAB — MAGNESIUM: Magnesium: 2 mg/dL (ref 1.7–2.4)

## 2020-08-30 MED ORDER — DEXTROSE 50 % IV SOLN
12.5000 g | INTRAVENOUS | Status: AC
  Administered 2020-08-30: 12.5 g via INTRAVENOUS
  Filled 2020-08-30: qty 50

## 2020-08-30 MED ORDER — POTASSIUM CHLORIDE 2 MEQ/ML IV SOLN: INTRAVENOUS | Status: DC

## 2020-08-30 MED ORDER — BISACODYL 10 MG RE SUPP
10.0000 mg | Freq: Once | RECTAL | Status: AC
  Administered 2020-08-30: 10 mg via RECTAL
  Filled 2020-08-30: qty 1

## 2020-08-30 MED ORDER — KCL-LACTATED RINGERS 20 MEQ/L IV SOLN
INTRAVENOUS | Status: DC
  Filled 2020-08-30 (×3): qty 1000
  Filled 2020-08-30: qty 30

## 2020-08-30 NOTE — Progress Notes (Signed)
RN clamped NG tube at 12:05pm. Patient ambulated to chair with 1 person assist. Patient tolerated clamping until 3:30pm. Patient then had complaints of "feeling sick on the stomach." Patient also guarding abdomen. RN unclamped patient and placed back on low-intermittent suction. Dark green output via NG noted.

## 2020-08-30 NOTE — Evaluation (Signed)
SLP Cancellation Note  Patient Details Name: Sonya Chavez MRN: [id number] DOB: 09-22-1925   Cancelled treatment:       Reason Eval/Treat Not Completed: Other (comment) (pt remains with NG in place with output, will follow up next date)   Chales Abrahams 08/30/2020, 10:31 AM   Rolena Infante, MS [facility] SLP Acute Rehab Services Office [phone] Pager [phone]

## 2020-08-30 NOTE — Progress Notes (Signed)
Occupational Therapy Treatment Patient Details Name: Sonya Chavez MRN: [id number] DOB: [birth year] Today's Date: 08/30/2020    History of present illness Patient is a 87 y.o. female who presented to [facility] with Rt abdominal pain, nausea, and emesis with PMH significant for HTN, HLD, CAD s/p CABG, DMII, MI, OA, small bowel obstruction with history of resection.   OT comments  Upon arrival MD outside patient's room asking for patient to be up to chair. Patient requires increased time to initiate directions with min A for bed mobility and min HHA to transfer to recliner with x1 mild posterior loss of balance while stepping towards recliner. Patient set up A in chair for g/h with mit donned at end of session. Recommend continued acute OT services to maximize patient activity tolerance, balance, safety in order to facilitate D/C to venue listed below.   Follow Up Recommendations  Home health OT;Supervision/Assistance - 24 hour    Equipment Recommendations  None recommended by OT       Precautions / Restrictions Precautions Precautions: Fall Precaution Comments: NGT Restrictions Weight Bearing Restrictions: No       Mobility Bed Mobility Overal bed mobility: Needs Assistance Bed Mobility: Supine to Sit     Supine to sit: Min assist     General bed mobility comments: increased time to initiate directions  Transfers Overall transfer level: Needs assistance Equipment used: 1 person hand held assist Transfers: Sit to/from Stand;Stand Pivot Transfers Sit to Stand: Min guard Stand pivot transfers: Min assist       General transfer comment: patient with mild posterior loss of balance during tranfer to recliner, min HHA for steadying and cues to sequence so patient would not sit on multiple lines/NGT    Balance Overall balance assessment: Needs assistance Sitting-balance support: No upper extremity supported;Feet supported Sitting balance-Leahy Scale: Good     Standing  balance support: Single extremity supported Standing balance-Leahy Scale: Poor Standing balance comment: reliant on UE support                           ADL either performed or assessed with clinical judgement   ADL Overall ADL's : Needs assistance/impaired     Grooming: Set up;Sitting;Wash/dry face Grooming Details (indicate cue type and reason): patient able to thoroughly wash her face seated in recliner                 Toilet Transfer: Minimal assistance;Stand-pivot;Cueing for safety;Cueing for sequencing Toilet Transfer Details (indicate cue type and reason): hand held assist for steadying and cues to redirect so patient would not sit on multiple lines         Functional mobility during ADLs: Minimal assistance;Cueing for safety;Cueing for sequencing                 Cognition Arousal/Alertness: Awake/alert Behavior During Therapy: WFL for tasks assessed/performed Overall Cognitive Status: History of cognitive impairments - at baseline Area of Impairment: Following commands                       Following Commands: Follows one step commands inconsistently;Follows one step commands with increased time       General Comments: pt pleasantly confused, answered most questions with "OK" or "mhm"                   Pertinent Vitals/ Pain       Pain Assessment: Faces Faces Pain Scale: No hurt  Frequency  Min 2X/week        Progress Toward Goals  OT Goals(current goals can now be found in the care plan section)  Progress towards OT goals: Progressing toward goals  Acute Rehab OT Goals Patient Stated Goal: husband would like patient home and continue with [facility] services OT Goal Formulation: With family Time For Goal Achievement: 09/11/20 Potential to Achieve Goals: Fair ADL Goals Pt Will Perform Grooming: with set-up;with supervision;standing Pt Will Perform Upper Body Bathing: with set-up;with supervision;sitting Pt Will  Perform Lower Body Bathing: with set-up;with supervision;sit to/from stand Pt Will Perform Upper Body Dressing: with set-up;with supervision;sitting Pt Will Perform Lower Body Dressing: with set-up;with supervision;sit to/from stand Pt Will Transfer to Toilet: with supervision;ambulating;bedside commode Pt Will Perform Toileting - Clothing Manipulation and hygiene: with supervision;sit to/from stand Additional ADL Goal #1: Pt will be S in and OOB for basic ADLs  Plan Discharge plan remains appropriate       AM-PAC OT "6 Clicks" Daily Activity     Outcome Measure   Help from another person eating meals?: Total (NPO) Help from another person taking care of personal grooming?: A Little Help from another person toileting, which includes using toliet, bedpan, or urinal?: A Lot Help from another person bathing (including washing, rinsing, drying)?: A Lot Help from another person to put on and taking off regular upper body clothing?: A Little Help from another person to put on and taking off regular lower body clothing?: A Lot 6 Click Score: 13    End of Session  OT Visit Diagnosis: Unsteadiness on feet (R26.81);Other abnormalities of gait and mobility (R26.89);Other symptoms and signs involving cognitive function   Activity Tolerance Patient tolerated treatment well   Patient Left in chair;with call bell/phone within reach;with chair alarm set;Other (comment) (mits on)   Nurse Communication Mobility status        Time: 9163-8466 OT Time Calculation (min): 16 min  Charges: OT General Charges $OT Visit: 1 Visit OT Treatments $Self Care/Home Management : 8-22 mins  Marlyce Huge OT OT pager: [phone]   Carmelia Roller 08/30/2020, 9:47 AM

## 2020-08-30 NOTE — Progress Notes (Signed)
Hypoglycemic Event  CBG: 68   Treatment: 12.5g 50% Dextrose injection  Symptoms: None  Follow-up CBG: Time: 0838 CBG Result: 120  Possible Reasons for Event: Patient NPO  Comments/MD notified:    Orvil Feil

## 2020-08-30 NOTE — Progress Notes (Signed)
bisacodyl (DULCOLAX) suppository 10 mg successful, patient had medium size, soft,  bowel movement.

## 2020-08-30 NOTE — Progress Notes (Addendum)
CC: Bowel obstruction  Subjective: She is lying in bed almost in the fetal position with mittens on.  She has no idea where she is, or what day it is.  She cannot tell whether she is passing gas or had flatus.  She does have a fair amount of green gastric fluid coming from her NG.  Objective: Vital signs in last 24 hours: Temp:  [97.4 F (36.3 C)-98.2 F (36.8 C)] 98.2 F (36.8 C) (01/26 0551) Pulse Rate:  [59-66] 59 (01/26 0551) Resp:  [18-20] 20 (01/26 0551) BP: (151-188)/(56-63) 186/62 (01/26 0551) SpO2:  [99 %-100 %] 100 % (01/26 0551) Last BM Date:  (unknown d/t patient orientation) Nothing p.o. recorded 2180 IV Urine 1000 NG 1700 Afebrile blood pressures elevated.  Sats 99 to 100% on nasal cannula K+ 3.4 remainder of the BMP is normal. WBC 8.5 H/H 8.8/28.8 Platelets 135,000 Film this a.m. shows NG tube sidehole over the stomach, persistent dilated loops of proximal small bowel, paucity of distal bowel gas. Intake/Output from previous day: 01/25 0701 - 01/26 0700 In: 2180.7 [I.V.:1780.7; IV Piggyback:400] Out: 2700 [Urine:1000; Emesis/NG output:1700] Intake/Output this shift: No intake/output data recorded.  General appearance: alert, cooperative and no distress Resp: clear to auscultation bilaterally and Anterior GI: Soft she is not distended.  Bowel sounds are high-pitched, no flatus or BM to the best of our knowledge.  Lab Results:  Recent Labs    08/29/20 0814 08/30/20 0558  WBC 7.8 8.5  HGB 8.9* 8.8*  HCT 28.8* 28.8*  PLT 132* 135*    BMET Recent Labs    08/29/20 0814 08/30/20 0558  NA 147* 144  K 3.7 3.4*  CL 103 99  CO2 32 29  GLUCOSE 99 72  BUN 21 22  CREATININE 1.09* 0.99  CALCIUM 8.8* 8.7*   PT/INR No results for input(s): LABPROT, INR in the last 72 hours.  Recent Labs  Lab 08/27/20 1803 08/28/20 0635  AST 17 12*  ALT 8 7  ALKPHOS 64 45  BILITOT 0.7 0.5  PROT 7.0 5.3*  ALBUMIN 3.6 2.9*     Lipase     Component  Value Date/Time   LIPASE 20 07/15/2020 1719     Medications: . insulin aspart  0-9 Units Subcutaneous Q4H  . metoprolol tartrate  2.5 mg Intravenous Q8H    Assessment/Plan Acute respiratory failure with hypoxia PAD (peripheral artery disease) (HCC) Normocytic anemia -H/H 8.8/28.8 Thrombocytopenia  -Platelets  >>135K  HTN CKD(chronic kidney disease), stage III (HCC) Cholelithiasis CAD(coronary artery disease) Alzheimer's dementiawith behavioral disturbance (HCC) Type II DM Gallstones - CT w/ distended gallbladder with large gallstone. Moderate intra and extrahepatic bile duct dilatation. No common duct stones are identified. LFT wnl 1/24. No RUQ tenderness. DNR   Small bowel obstruction CT with high-grade small bowel obstruction with transition zone in the upper mid abdomen.             Recurrent problem, likely due to adhesions from prior surgery Continue bowel rest, NG decompression, IV hydration Xray with contrast in the small bowel with small bowel distension this AM.              Xray in AM             Long term GOC needs to be discussed. This is recurrent and at some point (even this admission) surgery might be indicated. Will discuss with MD             Keep K >4 and  Mg > 2 for bowel function             Cont to mobilize for bowel funciton   FEN - NPO, NGT, IVF VTE - SCDs, okay for chemical prophylaxis from a general surgery standpoint  ID - Rocephin/Flagyl 1/24>> day 3;   Unclear why she is on this. WBC 7.8. Afebrile.    Plan: I have asked nurse to get her up and clamp the tube when she is up some and see how she does.  She has OT going to see her now,so with see how she does with mobilization.  I do not expect very much.  We will try Dulcolax suppository today also.  Repeat film in a.m.  I have also added some K+ to IV fluid. Checking Mag. Recheck labs/prealbumin in AM.   LOS: 3 days     Miquel Stacks 08/30/2020 Please see Amion

## 2020-08-30 NOTE — Progress Notes (Signed)
PROGRESS NOTE    [place] M Chhim  SWF:[id number] DOB: 08-14-1925 DOA: 08/27/2020 PCP: Alysia Penna, MD   Brief Narrative-87 year old female with medical history of small bowel obstruction, CKD stage IIIb, dementia, hyperlipidemia, diabetes mellitus type 2, CAD, hypertension presented with abdominal discomfort, nausea vomiting.  She has prior history of SBO.  In the ED CT scan showed high-grade small bowel obstruction, distended gallbladder with large gallstone patchy infiltrates at bases.  Cannot rule out achalasia.  General surgery was consulted, NG tube was inserted.   Assessment & Plan:   Active Problems:   HTN (hypertension)   DM (diabetes mellitus) (HCC)   CAD (coronary artery disease)   PAD (peripheral artery disease) (HCC)   SBO (small bowel obstruction) (HCC)   CKD (chronic kidney disease), stage III (HCC)   Cholelithiasis   Normocytic anemia   Alzheimer's dementia with behavioral disturbance (HCC)   Sepsis (HCC)    #1 small bowel obstruction patient still with NG tube with no significant improvement surgery planning to clamp the NG tube and increase her activity. kUB from today with persistent small bowel obstruction. Surgery recommends n.p.o. IV fluids and continue NG tube.    #2 acute hypoxic respiratory failure from community-acquired pneumonia present on admission continue Rocephin and Flagyl.  As above admission she was 88% on room air.  CT showed pneumonia.  #3 history of chronic normocytic anemia stable.  #4 peripheral arterial disease stable  #5  Hypokalemia potassium 3.4 normal magnesium.  K being replaced by general surgery.  #6 history of essential hypertension on IV metoprolol and hydralazine as needed.  #7 type 2 diabetes on SSI. CBG (last 3)  Recent Labs    08/30/20 0800 08/30/20 0838 08/30/20 1231  GLUCAP 68* 120* 97     Nutrition Problem: Severe Malnutrition Etiology: chronic illness (Alzheimer's dementia, recurrent  SBOs)     Signs/Symptoms: percent weight loss,severe fat depletion,severe muscle depletion    Interventions: Refer to RD note for recommendations  Estimated body mass index is 19.35 kg/m as calculated from the following:   Height as of this encounter: 5\' 2"  (1.575 m).   Weight as of this encounter: 48 kg.  DVT prophylaxis: scd Code Status: DNR Family Communication: None at bedside  disposition Plan:  Status is: Inpatient  Dispo: The patient is from: Home              Anticipated d/c is to: Home              Anticipated d/c date is: > 3 days              Patient currently is not medically stable to d/c.   Consultants: Surgery  Procedures: NG tube Antimicrobials: None  Subjective: Patient resting in bed she is confused as to where she is or she had a bowel movement or having flatus. NG tube in place  Objective: Vitals:   08/29/20 2318 08/30/20 0551 08/30/20 0802 08/30/20 1233  BP: (!) 151/63 (!) 186/62 (!) 175/52 (!) 183/51  Pulse: 64 (!) 59 65 (!) 50  Resp:  20 17 16   Temp:  98.2 F (36.8 C) 98.2 F (36.8 C) 98.3 F (36.8 C)  TempSrc:  Oral Oral Oral  SpO2:  100% 100% 100%  Weight:      Height:        Intake/Output Summary (Last 24 hours) at 08/30/2020 1252 Last data filed at 08/30/2020 0515 Gross per 24 hour  Intake 2180.68 ml  Output 1700 ml  Net  480.68 ml   Filed Weights   08/27/20 1610 08/27/20 2351  Weight: 51.7 kg 48 kg    Examination:  General exam: Appears calm and comfortable  Respiratory system: Clear to auscultation. Respiratory effort normal. Cardiovascular system: S1 & S2 heard, RRR. No JVD, murmurs, rubs, gallops or clicks. No pedal edema. Gastrointestinal system: Abdomen is nondistended, soft and nontender. No organomegaly or masses felt.  High-pitched bowel sounds heard. Central nervous system: Alert and oriented. No focal neurological deficits. Extremities: Symmetric 5 x 5 power. Skin: No rashes, lesions or ulcers Psychiatry:  Judgement and insight appear normal. Mood & affect appropriate.     Data Reviewed: I have personally reviewed following labs and imaging studies  CBC: Recent Labs  Lab 08/27/20 1803 08/28/20 0635 08/29/20 0814 08/30/20 0558  WBC 12.5* 8.5 7.8 8.5  NEUTROABS 11.2* 6.9  --   --   HGB 10.5* 8.3* 8.9* 8.8*  HCT 34.2* 27.1* 28.8* 28.8*  MCV 86.4 87.1 87.8 86.0  PLT 133* 132* 132* 135*   Basic Metabolic Panel: Recent Labs  Lab 08/27/20 1803 08/28/20 0635 08/29/20 0814 08/30/20 0558  NA 144 144 147* 144  K 4.2 3.5 3.7 3.4*  CL 102 107 103 99  CO2 29 25 32 29  GLUCOSE 191* 129* 99 72  BUN 33* 29* 21 22  CREATININE 1.27* 1.19* 1.09* 0.99  CALCIUM 9.3 8.1* 8.8* 8.7*  MG 1.8 1.7  --  2.0  PHOS 4.1 3.2  --   --    GFR: Estimated Creatinine Clearance: 27.5 mL/min (by C-G formula based on SCr of 0.99 mg/dL). Liver Function Tests: Recent Labs  Lab 08/27/20 1803 08/28/20 0635  AST 17 12*  ALT 8 7  ALKPHOS 64 45  BILITOT 0.7 0.5  PROT 7.0 5.3*  ALBUMIN 3.6 2.9*   No results for input(s): LIPASE, AMYLASE in the last 168 hours. No results for input(s): AMMONIA in the last 168 hours. Coagulation Profile: No results for input(s): INR, PROTIME in the last 168 hours. Cardiac Enzymes: Recent Labs  Lab 08/27/20 1803  CKTOTAL 48   BNP (last 3 results) No results for input(s): PROBNP in the last 8760 hours. HbA1C: Recent Labs    08/27/20 1803  HGBA1C 6.0*   CBG: Recent Labs  Lab 08/30/20 0035 08/30/20 0437 08/30/20 0800 08/30/20 0838 08/30/20 1231  GLUCAP 86 78 68* 120* 97   Lipid Profile: No results for input(s): CHOL, HDL, LDLCALC, TRIG, CHOLHDL, LDLDIRECT in the last 72 hours. Thyroid Function Tests: Recent Labs    08/28/20 0635  TSH 0.885   Anemia Panel: No results for input(s): VITAMINB12, FOLATE, FERRITIN, TIBC, IRON, RETICCTPCT in the last 72 hours. Sepsis Labs: Recent Labs  Lab 08/27/20 1803 08/27/20 1955 08/28/20 0031 08/28/20 0635   PROCALCITON <0.10  --   --   --   LATICACIDVEN 2.4* 2.2* 1.7 1.0    Recent Results (from the past 240 hour(s))  SARS Coronavirus 2 by RT PCR (hospital order, performed in [facility] hospital lab) Nasopharyngeal Nasopharyngeal Swab     Status: None   Collection Time: 08/27/20  4:21 PM   Specimen: Nasopharyngeal Swab  Result Value Ref Range Status   SARS Coronavirus 2 NEGATIVE NEGATIVE Final    Comment: (NOTE) SARS-CoV-2 target nucleic acids are NOT DETECTED.  The SARS-CoV-2 RNA is generally detectable in upper and lower respiratory specimens during the acute phase of infection. The lowest concentration of SARS-CoV-2 viral copies this assay can detect is 250 copies / mL.  A negative result does not preclude SARS-CoV-2 infection and should not be used as the sole basis for treatment or other patient management decisions.  A negative result may occur with improper specimen collection / handling, submission of specimen other than nasopharyngeal swab, presence of viral mutation(s) within the areas targeted by this assay, and inadequate number of viral copies (<250 copies / mL). A negative result must be combined with clinical observations, patient history, and epidemiological information.  Fact Sheet for Patients:   BoilerBrush.com.cy  Fact Sheet for Healthcare Providers: https://pope.com/  This test is not yet approved or  cleared by the [place] FDA and has been authorized for detection and/or diagnosis of SARS-CoV-2 by FDA under an Emergency Use Authorization (EUA).  This EUA will remain in effect (meaning this test can be used) for the duration of the COVID-19 declaration under Section 564(b)(1) of the Act, 21 U.S.C. section 360bbb-3(b)(1), unless the authorization is terminated or revoked sooner.  Performed at [facility], [address], [place], [place] [address]   Blood culture (routine x 2)      Status: None (Preliminary result)   Collection Time: 08/27/20  6:03 PM   Specimen: BLOOD  Result Value Ref Range Status   Specimen Description   Final    BLOOD RIGHT ARM Performed at [facility], [address] [place], [place] [address]    Special Requests   Final    BOTTLES DRAWN AEROBIC AND ANAEROBIC Blood Culture adequate volume Performed at [facility], [address] [place], [place] [address]    Culture   Final    NO GROWTH 3 DAYS Performed at [facility], [address], [place], [place] [address]    Report Status PENDING  Incomplete  Blood culture (routine x 2)     Status: None (Preliminary result)   Collection Time: 08/27/20  7:55 PM   Specimen: BLOOD  Result Value Ref Range Status   Specimen Description   Final    BLOOD RIGHT ARM Performed at [facility], [address], [place], [place] [address]    Special Requests   Final    BOTTLES DRAWN AEROBIC AND ANAEROBIC Blood Culture adequate volume Performed at [facility], [address], [place], [place] [address]    Culture   Final    NO GROWTH 3 DAYS Performed at [facility], [address] [place], [place] [address]    Report Status PENDING  Incomplete  Urine culture     Status: Abnormal   Collection Time: 08/27/20 11:53 PM   Specimen: Urine, Random  Result Value Ref Range Status   Specimen Description   Final    URINE, RANDOM Performed at [facility], [address] [place], [place] [address]    Special Requests   Final    NONE Performed at [facility], [address] [place], [place] [address]    Culture MULTIPLE SPECIES PRESENT, SUGGEST RECOLLECTION (A)  Final   Report Status 08/29/2020 FINAL  Final         Radiology Studies: DG Abd Portable 1V  Result Date: 08/30/2020 CLINICAL DATA:  Small-bowel obstruction. EXAM: PORTABLE ABDOMEN - 1 VIEW COMPARISON:   08/29/2020. FINDINGS: Limited exam as the patient would not lie flat. Surgical sutures right lower abdomen. NG tube noted with tip and side hole over the stomach. Persistent dilated loop of proximal small bowel noted. Paucity of distal bowel  gas. Hemidiaphragms not completely imaged. No free air is identified. Aortoiliac atherosclerotic vascular calcification. Degenerative changes lumbar spine and both hips. IMPRESSION: Limited exam. NG tube noted with tip and side hole over the stomach. Persistent dilated loop of proximal small bowel noted. Paucity of distal bowel gas. Findings consistent with persistent small-bowel obstruction. Electronically Signed   By: Maisie Fus  Register   On: 08/30/2020 06:04   DG Abd Portable 1V  Result Date: 08/29/2020 CLINICAL DATA:  NG tube position EXAM: PORTABLE ABDOMEN - 1 VIEW COMPARISON:  08/28/2020 FINDINGS: Enteric tube tip is in the left upper quadrant consistent with location in the body of the stomach. Residual contrast material in visualized small bowel with small bowel distention. IMPRESSION: Enteric tube tip is in the left upper quadrant consistent with location in the body of the stomach. Electronically Signed   By: Burman Nieves M.D.   On: 08/29/2020 02:43   DG Abd Portable 1V-Small Bowel Obstruction Protocol-initial, 8 hr delay  Result Date: 08/28/2020 CLINICAL DATA:  Small bowel obstruction EXAM: PORTABLE ABDOMEN - 1 VIEW COMPARISON:  CT earlier today FINDINGS: Contrast seen within the stomach and dilated proximal small bowel loops compatible with high-grade small bowel obstruction. NG tube is in the stomach. Contrast material within the urinary bladder from prior CT. No organomegaly. IMPRESSION: High-grade small bowel obstruction. Electronically Signed   By: Charlett Nose M.D.   On: 08/28/2020 19:04        Scheduled Meds: . insulin aspart  0-9 Units Subcutaneous Q4H  . metoprolol tartrate  2.5 mg Intravenous Q8H   Continuous Infusions: . cefTRIAXone  (ROCEPHIN)  IV 2 g (08/29/20 2241)  . lactated ringers with KCl 20 mEq/L 75 mL/hr at 08/30/20 1209  . metronidazole 500 mg (08/30/20 0555)     LOS: 3 days    Alwyn Ren, MD 08/30/2020, 12:52 PM

## 2020-08-30 NOTE — Progress Notes (Signed)
[facility] ([facility]) Community Based Palliative Care       This patient is enrolled in our palliative care services in the community.  [facility] will continue to follow for any discharge planning needs and to coordinate continuation of palliative care.       Thank you for the opportunity to participate in this patient's care.     Chrislyn King, BSN, RN [facility] Hospital Liaison   [phone] (24 h on call)  

## 2020-08-31 ENCOUNTER — Inpatient Hospital Stay (HOSPITAL_COMMUNITY): Payer: Medicare Other

## 2020-08-31 DIAGNOSIS — K56609 Unspecified intestinal obstruction, unspecified as to partial versus complete obstruction: Secondary | ICD-10-CM | POA: Diagnosis not present

## 2020-08-31 LAB — COMPREHENSIVE METABOLIC PANEL
ALT: 9 U/L (ref 0–44)
AST: 19 U/L (ref 15–41)
Albumin: 3 g/dL — ABNORMAL LOW (ref 3.5–5.0)
Alkaline Phosphatase: 46 U/L (ref 38–126)
Anion gap: 15 (ref 5–15)
BUN: 18 mg/dL (ref 8–23)
CO2: 30 mmol/L (ref 22–32)
Calcium: 8.6 mg/dL — ABNORMAL LOW (ref 8.9–10.3)
Chloride: 99 mmol/L (ref 98–111)
Creatinine, Ser: 1.03 mg/dL — ABNORMAL HIGH (ref 0.44–1.00)
GFR, Estimated: 51 mL/min — ABNORMAL LOW (ref >60)
Glucose, Bld: 77 mg/dL (ref 70–99)
Potassium: 3.4 mmol/L — ABNORMAL LOW (ref 3.5–5.1)
Sodium: 144 mmol/L (ref 135–145)
Total Bilirubin: 0.8 mg/dL (ref 0.3–1.2)
Total Protein: 5.6 g/dL — ABNORMAL LOW (ref 6.5–8.1)

## 2020-08-31 LAB — PREALBUMIN: Prealbumin: 17.1 mg/dL — ABNORMAL LOW (ref 18–38)

## 2020-08-31 LAB — GLUCOSE, CAPILLARY
Glucose-Capillary: 119 mg/dL — ABNORMAL HIGH (ref 70–99)
Glucose-Capillary: 139 mg/dL — ABNORMAL HIGH (ref 70–99)
Glucose-Capillary: 139 mg/dL — ABNORMAL HIGH (ref 70–99)
Glucose-Capillary: 145 mg/dL — ABNORMAL HIGH (ref 70–99)
Glucose-Capillary: 182 mg/dL — ABNORMAL HIGH (ref 70–99)
Glucose-Capillary: 50 mg/dL — ABNORMAL LOW (ref 70–99)
Glucose-Capillary: 72 mg/dL (ref 70–99)
Glucose-Capillary: 75 mg/dL (ref 70–99)
Glucose-Capillary: 99 mg/dL (ref 70–99)

## 2020-08-31 LAB — MAGNESIUM: Magnesium: 1.7 mg/dL (ref 1.7–2.4)

## 2020-08-31 MED ORDER — POTASSIUM CHLORIDE 2 MEQ/ML IV SOLN
INTRAVENOUS | Status: AC
  Filled 2020-08-31 (×2): qty 1000

## 2020-08-31 MED ORDER — DEXTROSE 50 % IV SOLN
25.0000 g | INTRAVENOUS | Status: AC
  Administered 2020-08-31: 25 g via INTRAVENOUS

## 2020-08-31 MED ORDER — DEXTROSE 5 % IV SOLN: INTRAVENOUS | Status: DC

## 2020-08-31 MED ORDER — METOPROLOL TARTRATE 5 MG/5ML IV SOLN: 5.0000 mg | Freq: Three times a day (TID) | INTRAVENOUS | Status: DC

## 2020-08-31 MED ORDER — POTASSIUM CHLORIDE 20 MEQ PO PACK
20.0000 meq | PACK | Freq: Two times a day (BID) | ORAL | Status: DC
  Administered 2020-08-31 – 2020-09-02 (×5): 20 meq
  Filled 2020-08-31 (×5): qty 1

## 2020-08-31 MED ORDER — METOPROLOL TARTRATE 5 MG/5ML IV SOLN
5.0000 mg | Freq: Three times a day (TID) | INTRAVENOUS | Status: DC
  Administered 2020-08-31 – 2020-09-01 (×4): 5 mg via INTRAVENOUS
  Filled 2020-08-31 (×4): qty 5

## 2020-08-31 NOTE — Progress Notes (Signed)
PROGRESS NOTE    [place] M Helinski  WNI:[id number] DOB: 1926-08-01 DOA: 08/27/2020 PCP: Alysia Penna, MD   Brief Narrative-87 year old female with medical history of small bowel obstruction, CKD stage IIIb, dementia, hyperlipidemia, diabetes mellitus type 2, CAD, hypertension presented with abdominal discomfort, nausea vomiting.  She has prior history of SBO.  In the ED CT scan showed high-grade small bowel obstruction, distended gallbladder with large gallstone patchy infiltrates at bases.  Cannot rule out achalasia.  General surgery was consulted, NG tube was inserted.   Assessment & Plan:   Active Problems:   HTN (hypertension)   DM (diabetes mellitus) (HCC)   CAD (coronary artery disease)   PAD (peripheral artery disease) (HCC)   Small bowel obstruction (HCC)   CKD (chronic kidney disease), stage III (HCC)   Cholelithiasis   Normocytic anemia   Alzheimer's dementia with behavioral disturbance (HCC)   Sepsis (HCC)    #1 small bowel obstruction patient still with NG tube with no significant improvement surgery planning to clamp the NG tube and increase her activity. kUB  with persistent small bowel obstruction. Surgery recommends n.p.o. IV fluids and continue NG tube. Trial of clamping today Left message with son to call me back    #2 acute hypoxic respiratory failure from community-acquired pneumonia present on admission continue Rocephin and Flagyl.  As above admission she was 88% on room air.  CT showed pneumonia.  #3 history of chronic normocytic anemia stable.  #4 peripheral arterial disease stable  #5  Hypokalemia potassium 3.4 normal magnesium.  K being replaced by general surgery.  #6 history of essential hypertension on IV metoprolol and hydralazine as needed.increase metoprolol to 5 mg.  #7 type 2 diabetes on SSI.dc ssi. CBG (last 3)  Recent Labs    08/31/20 0107 08/31/20 0412 08/31/20 0729  GLUCAP 145* 99 72   #8 Goals of care dw family.will  consult PC.left message with son for call back.  Nutrition Problem: Severe Malnutrition Etiology: chronic illness (Alzheimer's dementia, recurrent SBOs)     Signs/Symptoms: percent weight loss,severe fat depletion,severe muscle depletion    Interventions: Refer to RD note for recommendations  Estimated body mass index is 19.35 kg/m as calculated from the following:   Height as of this encounter: 5\' 2"  (1.575 m).   Weight as of this encounter: 48 kg.  DVT prophylaxis: scd Code Status: DNR Family Communication: None at bedside  disposition Plan:  Status is: Inpatient  Dispo: The patient is from: Home              Anticipated d/c is to: Home              Anticipated d/c date is: > 3 days              Patient currently is not medically stable to d/c.   Consultants: Surgery  Procedures: NG tube Antimicrobials: None  Subjective: Patient resting in bed she is confused as to where she is or she had a bowel movement or having flatus. NG tube in place ngt clamped yesterday patient started getting nauseous   Objective: Vitals:   08/30/20 0802 08/30/20 1233 08/30/20 2033 08/31/20 0408  BP: (!) 175/52 (!) 183/51 (!) 187/60 (!) 188/65  Pulse: 65 (!) 50 (!) 55 (!) 59  Resp: 17 16 16 14   Temp: 98.2 F (36.8 C) 98.3 F (36.8 C) 98.4 F (36.9 C) 98 F (36.7 C)  TempSrc: Oral Oral Oral Oral  SpO2: 100% 100% 100% 92%  Weight:  Height:        Intake/Output Summary (Last 24 hours) at 08/31/2020 1019 Last data filed at 08/31/2020 0423 Gross per 24 hour  Intake 2043.94 ml  Output 1850 ml  Net 193.94 ml   Filed Weights   08/27/20 1610 08/27/20 2351  Weight: 51.7 kg 48 kg    Examination:  General exam: Appears calm and comfortable  Respiratory system: Clear to auscultation. Respiratory effort normal. Cardiovascular system: S1 & S2 heard, RRR. No JVD, murmurs, rubs, gallops or clicks. No pedal edema. Gastrointestinal system: Abdomen is nondistended, soft and nontender.  No organomegaly or masses felt.  High-pitched bowel sounds heard. Central nervous system: awake confused  Extremities: Symmetric 5 x 5 power. Skin: No rashes, lesions or ulcers Psychiatry: Judgement and insight impaired   Data Reviewed: I have personally reviewed following labs and imaging studies  CBC: Recent Labs  Lab 08/27/20 1803 08/28/20 0635 08/29/20 0814 08/30/20 0558  WBC 12.5* 8.5 7.8 8.5  NEUTROABS 11.2* 6.9  --   --   HGB 10.5* 8.3* 8.9* 8.8*  HCT 34.2* 27.1* 28.8* 28.8*  MCV 86.4 87.1 87.8 86.0  PLT 133* 132* 132* 135*   Basic Metabolic Panel: Recent Labs  Lab 08/27/20 1803 08/28/20 0635 08/29/20 0814 08/30/20 0558 08/31/20 0558  NA 144 144 147* 144 144  K 4.2 3.5 3.7 3.4* 3.4*  CL 102 107 103 99 99  CO2 29 25 32 29 30  GLUCOSE 191* 129* 99 72 77  BUN 33* 29* 21 22 18   CREATININE 1.27* 1.19* 1.09* 0.99 1.03*  CALCIUM 9.3 8.1* 8.8* 8.7* 8.6*  MG 1.8 1.7  --  2.0  --   PHOS 4.1 3.2  --   --   --    GFR: Estimated Creatinine Clearance: 26.4 mL/min (A) (by C-G formula based on SCr of 1.03 mg/dL (H)). Liver Function Tests: Recent Labs  Lab 08/27/20 1803 08/28/20 0635 08/31/20 0558  AST 17 12* 19  ALT 8 7 9   ALKPHOS 64 45 46  BILITOT 0.7 0.5 0.8  PROT 7.0 5.3* 5.6*  ALBUMIN 3.6 2.9* 3.0*   No results for input(s): LIPASE, AMYLASE in the last 168 hours. No results for input(s): AMMONIA in the last 168 hours. Coagulation Profile: No results for input(s): INR, PROTIME in the last 168 hours. Cardiac Enzymes: Recent Labs  Lab 08/27/20 1803  CKTOTAL 48   BNP (last 3 results) No results for input(s): PROBNP in the last 8760 hours. HbA1C: No results for input(s): HGBA1C in the last 72 hours. CBG: Recent Labs  Lab 08/30/20 2354 08/31/20 0028 08/31/20 0107 08/31/20 0412 08/31/20 0729  GLUCAP 50* 182* 145* 99 72   Lipid Profile: No results for input(s): CHOL, HDL, LDLCALC, TRIG, CHOLHDL, LDLDIRECT in the last 72 hours. Thyroid Function  Tests: No results for input(s): TSH, T4TOTAL, FREET4, T3FREE, THYROIDAB in the last 72 hours. Anemia Panel: No results for input(s): VITAMINB12, FOLATE, FERRITIN, TIBC, IRON, RETICCTPCT in the last 72 hours. Sepsis Labs: Recent Labs  Lab 08/27/20 1803 08/27/20 1955 08/28/20 0031 08/28/20 0635  PROCALCITON <0.10  --   --   --   LATICACIDVEN 2.4* 2.2* 1.7 1.0    Recent Results (from the past 240 hour(s))  SARS Coronavirus 2 by RT PCR (hospital order, performed in [place] hospital lab) Nasopharyngeal Nasopharyngeal Swab     Status: None   Collection Time: 08/27/20  4:21 PM   Specimen: Nasopharyngeal Swab  Result Value Ref Range Status   SARS Coronavirus  2 NEGATIVE NEGATIVE Final    Comment: (NOTE) SARS-CoV-2 target nucleic acids are NOT DETECTED.  The SARS-CoV-2 RNA is generally detectable in upper and lower respiratory specimens during the acute phase of infection. The lowest concentration of SARS-CoV-2 viral copies this assay can detect is 250 copies / mL. A negative result does not preclude SARS-CoV-2 infection and should not be used as the sole basis for treatment or other patient management decisions.  A negative result may occur with improper specimen collection / handling, submission of specimen other than nasopharyngeal swab, presence of viral mutation(s) within the areas targeted by this assay, and inadequate number of viral copies (<250 copies / mL). A negative result must be combined with clinical observations, patient history, and epidemiological information.  Fact Sheet for Patients:   BoilerBrush.com.cy  Fact Sheet for Healthcare Providers: https://pope.com/  This test is not yet approved or  cleared by the [place] FDA and has been authorized for detection and/or diagnosis of SARS-CoV-2 by FDA under an Emergency Use Authorization (EUA).  This EUA will remain in effect (meaning this test can be used) for  the duration of the COVID-19 declaration under Section 564(b)(1) of the Act, 21 U.S.C. section 360bbb-3(b)(1), unless the authorization is terminated or revoked sooner.  Performed at [facility], [address] [place], [place] [address]   Blood culture (routine x 2)     Status: None (Preliminary result)   Collection Time: 08/27/20  6:03 PM   Specimen: BLOOD  Result Value Ref Range Status   Specimen Description   Final    BLOOD RIGHT ARM Performed at [facility], [address] [place], [place] [address]    Special Requests   Final    BOTTLES DRAWN AEROBIC AND ANAEROBIC Blood Culture adequate volume Performed at [facility], [address], [place], [place] [address]    Culture   Final    NO GROWTH 3 DAYS Performed at [facility], [address] [place], [place] [address]    Report Status PENDING  Incomplete  Blood culture (routine x 2)     Status: None (Preliminary result)   Collection Time: 08/27/20  7:55 PM   Specimen: BLOOD  Result Value Ref Range Status   Specimen Description   Final    BLOOD RIGHT ARM Performed at [facility], [address], [place], [place] [address]    Special Requests   Final    BOTTLES DRAWN AEROBIC AND ANAEROBIC Blood Culture adequate volume Performed at [facility], [address], [place], [place] [address]    Culture   Final    NO GROWTH 3 DAYS Performed at [facility], [address] [place], [place] [address]    Report Status PENDING  Incomplete  Urine culture     Status: Abnormal   Collection Time: 08/27/20 11:53 PM   Specimen: Urine, Random  Result Value Ref Range Status   Specimen Description   Final    URINE, RANDOM Performed at [facility], [address] [place], [place] [address]    Special Requests   Final    NONE Performed at [facility], [address]  [place], [place] [address]    Culture MULTIPLE SPECIES PRESENT, SUGGEST RECOLLECTION (A)  Final   Report Status 08/29/2020 FINAL  Final         Radiology Studies: DG Abd 1 View  Result Date: 08/31/2020 CLINICAL  DATA:  87 year old female with small bowel obstruction. EXAM: ABDOMEN - 1 VIEW COMPARISON:  08/30/2020 and earlier. FINDINGS: Portable AP supine view at 0750 hours. Stable enteric tube, side hole at the level of the gastric fundus. Continued gas distended mid and upper abdominal small bowel loops up to 45 mm diameter. Stomach does not appear dilated. Paucity of large bowel gas as before. Right lower quadrant surgical clips redemonstrated. Vascular calcifications. No acute osseous abnormality identified. Rim calcified bulky gallstone again evident in the right upper quadrant. IMPRESSION: 1. Unresolved small-bowel obstruction, probably with only modest improvement compared to the CT 08/27/2020. 2. Stable enteric tube which decompresses the stomach now. 3. Bulky rim calcified gallstone. Electronically Signed   By: Odessa Fleming M.D.   On: 08/31/2020 08:33   DG Abd Portable 1V  Result Date: 08/30/2020 CLINICAL DATA:  Small-bowel obstruction. EXAM: PORTABLE ABDOMEN - 1 VIEW COMPARISON:  08/29/2020. FINDINGS: Limited exam as the patient would not lie flat. Surgical sutures right lower abdomen. NG tube noted with tip and side hole over the stomach. Persistent dilated loop of proximal small bowel noted. Paucity of distal bowel gas. Hemidiaphragms not completely imaged. No free air is identified. Aortoiliac atherosclerotic vascular calcification. Degenerative changes lumbar spine and both hips. IMPRESSION: Limited exam. NG tube noted with tip and side hole over the stomach. Persistent dilated loop of proximal small bowel noted. Paucity of distal bowel gas. Findings consistent with persistent small-bowel obstruction. Electronically Signed   By: Maisie Fus  Register   On: 08/30/2020 06:04        Scheduled  Meds: . metoprolol tartrate  2.5 mg Intravenous Q8H  . potassium chloride  20 mEq Per Tube BID   Continuous Infusions: . dextrose    . lactated ringers with kcl 75 mL/hr at 08/31/20 0241     LOS: 4 days    Alwyn Ren, MD 08/31/2020, 10:19 AM

## 2020-08-31 NOTE — Evaluation (Signed)
SLP Cancellation Note  Patient Details Name: Sonya Chavez MRN: [id number] DOB: June 06, 1926   Cancelled treatment:       Reason Eval/Treat Not Completed: Other (comment);Medical issues which prohibited therapy (pt continues with SBO and NG in place)  Sonya Infante, MS [facility] SLP Acute Rehab Services Office [phone] Pager [phone]   Sonya Chavez 08/31/2020, 9:08 AM

## 2020-08-31 NOTE — Care Management Important Message (Signed)
Important Message  Patient Details  IM Letter placed in Patient's room. Name: Sonya Chavez MRN: [id number] Date of Birth: 1925-08-31   Medicare Important Message Given:  Yes     Caren Macadam 08/31/2020, 10:37 AM

## 2020-08-31 NOTE — Progress Notes (Signed)
Hypoglycemic Event  CBG: 50  Treatment: covering MD Kyere paged, 25g D50 push ordered and given  Symptoms: none  Follow-up CBG: 182 Time: 0028   CBG Result: 145 Time: 0107  Possible Reasons for Event: Pt NPO on sensitive sliding scale. Labile BGs    Sonya Chavez A Sonya Chavez

## 2020-08-31 NOTE — Progress Notes (Signed)
Patient clamped through 1130am-1530pm. No visual signs of discomfort. No moaning or groaning. No vomiting.  When asked, patient denied pain or discomfort. Patient unclamped at 3:30pm and continued on low intermittent suction. During the suction phase patient had of dark green output. Patient reclamped at 1745. Resting on RA at 95%.

## 2020-08-31 NOTE — Progress Notes (Signed)
Physical Therapy Treatment Patient Details Name: [place] M Meath MRN: [id number] DOB: 18-Nov-1925 Today's Date: 08/31/2020    History of Present Illness Patient is a 87 y.o. female who presented to [facility] with Rt abdominal pain, nausea, and emesis with PMH significant for HTN, HLD, CAD s/p CABG, DMII, MI, OA, small bowel obstruction with history of resection.    PT Comments    Patient making steady progress with acute PT. She increased ambulation distance today with min guard and SPC. Intermittent HHA min assist as pt reached Lt UE out for support. No overt LOB noted. Pt on 2L/min at start of session and sats at 98% at rest. On RA pt resting at 90% and sats improved with mobility to 94%. EOS pt resting on RA at 95%. Discussed safe guarding position for pt with transfers/gait with pt's husband and he reports he can provide 24/7 assist and help with mobility as he has before. Continue to recommend follow up HHPT services and assist from spouse/family once medically ready for discharge home. Acute PT will continue to progress as able.     Follow Up Recommendations  Home health PT;Supervision for mobility/OOB;Supervision/Assistance - 24 hour     Equipment Recommendations  None recommended by PT    Recommendations for Other Services       Precautions / Restrictions Precautions Precautions: Fall Precaution Comments: NGT Restrictions Weight Bearing Restrictions: No    Mobility  Bed Mobility Overal bed mobility: Needs Assistance Bed Mobility: Supine to Sit     Supine to sit: Min assist;HOB elevated     General bed mobility comments: pt requires tactile cues to sequence and initiate mobilizing to EOB and min assist to fully complete. extra time needed for processing.  Transfers Overall transfer level: Needs assistance Equipment used: 1 person hand held assist Transfers: Sit to/from Stand Sit to Stand: Min guard         General transfer comment: close min guard for rsie  from EOB. pt using bil UE's for power up. slightly unsteady with rising but no LOB.  Ambulation/Gait Ambulation/Gait assistance: Min guard;Min assist Gait Distance (Feet): 90 Feet Assistive device: Straight cane;1 person hand held assist Gait Pattern/deviations: Step-through pattern;Decreased step length - right;Decreased step length - left;Decreased stride length;Shuffle Gait velocity: decr   General Gait Details: pt steady with gait using SPC and maintaining low guard position on Lt UE. intermittently pt reaching for 1HHA with Lt UE. close guarding for safety. discussed guarding position with pt's spouse for transfers and gait and use belt for safety.   Stairs             Wheelchair Mobility    Modified Rankin (Stroke Patients Only)       Balance Overall balance assessment: Needs assistance Sitting-balance support: No upper extremity supported;Feet supported Sitting balance-Leahy Scale: Good     Standing balance support: Single extremity supported Standing balance-Leahy Scale: Poor Standing balance comment: reliant on UE support                            Cognition Arousal/Alertness: Awake/alert Behavior During Therapy: WFL for tasks assessed/performed Overall Cognitive Status: History of cognitive impairments - at baseline Area of Impairment: Following commands                       Following Commands: Follows one step commands inconsistently;Follows one step commands with increased time       General Comments: pt  remains pleasant and confused at baseline. answers questions "mhm". pts spouse reports this is typical.      Exercises      General Comments General comments (skin integrity, edema, etc.): pt on 2L/min and sats at 98% at rest. on RA pt resting at 90% and sats improved with mobility to 94%. EOS pt resting on RA at 95%.      Pertinent Vitals/Pain Pain Assessment: No/denies pain Faces Pain Scale: No hurt    Home Living  Family/patient expects to be discharged to:: Private residence                    Prior Function            PT Goals (current goals can now be found in the care plan section) Acute Rehab PT Goals Patient Stated Goal: husband would like patient home and continue with [facility] services PT Goal Formulation: With family Time For Goal Achievement: 09/11/20 Potential to Achieve Goals: Good Progress towards PT goals: Progressing toward goals    Frequency    Min 3X/week      PT Plan Current plan remains appropriate    Co-evaluation              AM-PAC PT "6 Clicks" Mobility   Outcome Measure  Help needed turning from your back to your side while in a flat bed without using bedrails?: A Little Help needed moving from lying on your back to sitting on the side of a flat bed without using bedrails?: A Little Help needed moving to and from a bed to a chair (including a wheelchair)?: A Little Help needed standing up from a chair using your arms (e.g., wheelchair or bedside chair)?: A Little Help needed to walk in hospital room?: A Little Help needed climbing 3-5 steps with a railing? : A Little 6 Click Score: 18    End of Session Equipment Utilized During Treatment: Gait belt Activity Tolerance: Patient tolerated treatment well Patient left: in chair;with call bell/phone within reach;with chair alarm set;with family/visitor present Nurse Communication: Mobility status PT Visit Diagnosis: Muscle weakness (generalized) (M62.81);Difficulty in walking, not elsewhere classified (R26.2)     Time: 3818-2993 PT Time Calculation (min) (ACUTE ONLY): 32 min  Charges:  $Gait Training: 8-22 mins $Therapeutic Activity: 8-22 mins                     Wynn Maudlin, DPT Acute Rehabilitation Services Office [phone] Pager [phone]     Anitra Lauth 08/31/2020, 12:38 PM

## 2020-08-31 NOTE — Progress Notes (Signed)
CC: Small bowel obstruction  Subjective: BM x1 yesterday, 900 mL per shift yesterday through her NG.  She is still confused lying in, NG still draining green gastric fluid.  Her abdomen is soft and nontender, there is no distention.  Objective: Vital signs in last 24 hours: Temp:  [98 F (36.7 C)-98.4 F (36.9 C)] 98 F (36.7 C) (01/27 0408) Pulse Rate:  [50-59] 59 (01/27 0408) Resp:  [14-16] 14 (01/27 0408) BP: (183-188)/(51-65) 188/65 (01/27 0408) SpO2:  [92 %-100 %] 92 % (01/27 0408) Last BM Date: 08/30/20 2043 IV Urine 50 1800 NG BM x1 Afebrile, vital signs are stable Prealbumin 17 CMP is stable Film:  Unresolved small-bowel obstruction, probably with only modest improvement   Intake/Output from previous day: 01/26 0701 - 01/27 0700 In: 2043.9 [I.V.:2043.9] Out: 1850 [Urine:50; Emesis/NG output:1800] Intake/Output this shift: No intake/output data recorded.  General appearance: alert, cooperative and no distress Resp: Clear anterior GI: Soft nondistended, few bowel sounds, BM yesterday with Dulcolax  Lab Results:  Recent Labs    08/29/20 0814 08/30/20 0558  WBC 7.8 8.5  HGB 8.9* 8.8*  HCT 28.8* 28.8*  PLT 132* 135*    BMET Recent Labs    08/30/20 0558 08/31/20 0558  NA 144 144  K 3.4* 3.4*  CL 99 99  CO2 29 30  GLUCOSE 72 77  BUN 22 18  CREATININE 0.99 1.03*  CALCIUM 8.7* 8.6*   PT/INR No results for input(s): LABPROT, INR in the last 72 hours.  Recent Labs  Lab 08/27/20 1803 08/28/20 0635 08/31/20 0558  AST 17 12* 19  ALT 8 7 9   ALKPHOS 64 45 46  BILITOT 0.7 0.5 0.8  PROT 7.0 5.3* 5.6*  ALBUMIN 3.6 2.9* 3.0*     Lipase     Component Value Date/Time   LIPASE 20 07/15/2020 1719     Medications: . insulin aspart  0-9 Units Subcutaneous Q4H  . metoprolol tartrate  2.5 mg Intravenous Q8H    Assessment/Plan Acute respiratory failure with hypoxia PAD (peripheral artery disease) (HCC) Normocytic anemia -H/H  8.8/28.8 Thrombocytopenia  -Platelets  >>135K  HTN CKDstage III   -  Creatinine 1.09>>.99>>1.03 Cholelithiasis CAD(coronary artery disease) Alzheimer's dementiawith behavioral disturbance (HCC) Type II DM Gallstones - CT w/ distended gallbladder with large gallstone. Moderate intra and extrahepatic bile duct dilatation. No common duct stones are identified.LFT wnl1/24. No RUQ tenderness. DNR Moderate malnutrition  -Prealbumin 17.1   Small bowel obstruction CT with high-grade small bowel obstruction with transition zone in the upper mid abdomen. Recurrent problem, likely due to adhesions from prior surgery Continue bowel rest,NG decompression, IV hydration Xray with contrast in the small bowel with small bowel distension this AM.  Xray in AM Long term GOC needs to be discussed. This is recurrent and at some point (even this admission) surgery might be indicated.Will discuss with MD Keep K >4 and Mg >2 for bowel function Cont to mobilize for bowel funciton   FEN -NPO, NGT, IVF VTE -SCDs, okay for chemical prophylaxis from a general surgery standpoint ID -Rocephin/Flagyl 1/24>> day 3;   Unclear why she is on this. WBC 7.8. Afebrile.   Plan: We'll just try her on some clamping trials today and see how she does.  Unfortunately I don't think she could tell [birth year]31[birth year]2005 if she is having nausea or distress with the tube clamped.  No family in the room again today.  Would again recommend goals of care be discussed with the family.  LOS: 4 days    Sonya Chavez 08/31/2020 Please see Amion

## 2020-09-01 ENCOUNTER — Other Ambulatory Visit: Payer: Self-pay | Admitting: *Deleted

## 2020-09-01 DIAGNOSIS — R112 Nausea with vomiting, unspecified: Secondary | ICD-10-CM

## 2020-09-01 DIAGNOSIS — G309 Alzheimer's disease, unspecified: Secondary | ICD-10-CM | POA: Diagnosis not present

## 2020-09-01 DIAGNOSIS — R109 Unspecified abdominal pain: Secondary | ICD-10-CM

## 2020-09-01 DIAGNOSIS — Z7189 Other specified counseling: Secondary | ICD-10-CM

## 2020-09-01 DIAGNOSIS — J9601 Acute respiratory failure with hypoxia: Secondary | ICD-10-CM | POA: Diagnosis not present

## 2020-09-01 DIAGNOSIS — R6520 Severe sepsis without septic shock: Secondary | ICD-10-CM | POA: Diagnosis not present

## 2020-09-01 DIAGNOSIS — Z515 Encounter for palliative care: Secondary | ICD-10-CM

## 2020-09-01 DIAGNOSIS — K56609 Unspecified intestinal obstruction, unspecified as to partial versus complete obstruction: Secondary | ICD-10-CM | POA: Diagnosis not present

## 2020-09-01 DIAGNOSIS — A419 Sepsis, unspecified organism: Secondary | ICD-10-CM | POA: Diagnosis not present

## 2020-09-01 LAB — GLUCOSE, CAPILLARY
Glucose-Capillary: 136 mg/dL — ABNORMAL HIGH (ref 70–99)
Glucose-Capillary: 150 mg/dL — ABNORMAL HIGH (ref 70–99)
Glucose-Capillary: 199 mg/dL — ABNORMAL HIGH (ref 70–99)

## 2020-09-01 LAB — BASIC METABOLIC PANEL
Anion gap: 13 (ref 5–15)
BUN: 16 mg/dL (ref 8–23)
CO2: 30 mmol/L (ref 22–32)
Calcium: 8.3 mg/dL — ABNORMAL LOW (ref 8.9–10.3)
Chloride: 93 mmol/L — ABNORMAL LOW (ref 98–111)
Creatinine, Ser: 0.95 mg/dL (ref 0.44–1.00)
GFR, Estimated: 56 mL/min — ABNORMAL LOW (ref >60)
Glucose, Bld: 172 mg/dL — ABNORMAL HIGH (ref 70–99)
Potassium: 3.7 mmol/L (ref 3.5–5.1)
Sodium: 136 mmol/L (ref 135–145)

## 2020-09-01 LAB — CULTURE, BLOOD (ROUTINE X 2)
Culture: NO GROWTH
Culture: NO GROWTH
Special Requests: ADEQUATE
Special Requests: ADEQUATE

## 2020-09-01 MED ORDER — GLYCOPYRROLATE 0.2 MG/ML IJ SOLN: 0.2000 mg | INTRAMUSCULAR | Status: DC | PRN

## 2020-09-01 MED ORDER — ONDANSETRON HCL 4 MG/2ML IJ SOLN
4.0000 mg | Freq: Three times a day (TID) | INTRAMUSCULAR | Status: DC
  Administered 2020-09-01 – 2020-09-06 (×15): 4 mg via INTRAVENOUS
  Filled 2020-09-01 (×15): qty 2

## 2020-09-01 MED ORDER — MORPHINE SULFATE (PF) 2 MG/ML IV SOLN
1.0000 mg | Freq: Four times a day (QID) | INTRAVENOUS | Status: DC
Start: 2020-09-01 — End: 2020-09-06
  Administered 2020-09-01 – 2020-09-06 (×20): 1 mg via INTRAVENOUS
  Filled 2020-09-01 (×20): qty 1

## 2020-09-01 MED ORDER — HALOPERIDOL LACTATE 5 MG/ML IJ SOLN: 2.0000 mg | Freq: Four times a day (QID) | INTRAMUSCULAR | Status: DC | PRN

## 2020-09-01 MED ORDER — LORAZEPAM 2 MG/ML IJ SOLN: 0.5000 mg | INTRAMUSCULAR | Status: DC | PRN

## 2020-09-01 MED ORDER — PROMETHAZINE HCL 25 MG/ML IJ SOLN: 12.5000 mg | Freq: Four times a day (QID) | INTRAMUSCULAR | Status: DC | PRN

## 2020-09-01 MED ORDER — MORPHINE SULFATE (PF) 2 MG/ML IV SOLN
1.0000 mg | INTRAVENOUS | Status: DC | PRN
  Administered 2020-09-05 – 2020-09-06 (×2): 2 mg via INTRAVENOUS
  Filled 2020-09-01 (×2): qty 1

## 2020-09-01 MED ORDER — DICLOFENAC SODIUM 1 % EX GEL: 4.0000 g | Freq: Four times a day (QID) | CUTANEOUS | Status: DC | PRN

## 2020-09-01 NOTE — Consult Note (Signed)
Consultation Note Date: 09/01/2020   Patient Name: Sonya Chavez  DOB: 09-07-1925  MRN: [id number]  Age / Sex: 87 y.o., female  PCP: Velna Hatchet, MD Referring Physician: Georgette Shell, MD  Reason for Consultation: Establishing goals of care and Terminal Care  HPI/Patient Profile: 87 y.o. female  with past medical history of dementia, hospitalization for small bowel obstruction Dec 2021, CKD stage IIIb, HLD, DM type 2, CAD, HTN admitted on 08/27/2020 with abdominal discomfort, nausea, vomiting. In ED, CT scan revealed high-grade small bowel obstruction, distended gallbladder with large gallstone patchy infiltrates at bases, cannot rule out achalasia. General surgery following. NGT placed. Patient is not tolerating clamping trials. Poor surgical candidate due to age, frailty, dementia. Palliative medicine consultation for goals of care.   Clinical Assessment and Goals of Care:  I have reviewed medical records, discussed with Dr. Zigmund Daniel, and met with patient's husband in family conference room. Son Broadus John) and DIL Vaughan Basta) on speaker phone. (They live in [place]).   Patient with baseline dementia. She is easily aroused but nonverbal and disoriented with baseline dementia. Unable to participate in [place] discussion. No facial grimacing but grabbing at abdomen during visit. NGT clamped but unfortunately patient vomited by end of PMT discussion. RN restarted NGT to LIS.   I introduced Palliative Medicine as specialized medical care for people living with serious illness. It focuses on providing relief from the symptoms and stress of a serious illness.  Prior to hospitalization, living home with husband who is primary caregiver. Family reports progressive dementia in the last year but ongoing health decline since hospitalization in December for SBO.   Discussed events leading up to admission and  course of hospitalization including diagnoses, interventions, plan of care. Discussed unfortunate lack of improvement despite NGT and medical management. Next steps being surgery but concern that she is a poor surgical candidate with age, frailty, dementia, and fear that she will be unable to recover from surgery with underlying irreversible condition.   Family understands her condition and prognosis. Husband, son, and DIL all agree they do not wish to pursue surgical intervention, knowing this will not impact her quality of life with progressive dementia.   Educated on medical recommendation for shift to comfort measures and hospice involvement. Explained focus on comfort, symptom management, allowing nature to take course, and peace/dignity as she nears EOL. Husband tearful at the thought of losing his wife of 72 years, but speaks of his wish for her to rest peacefully. Son and DIL agree with shift to comfort and hospice involvement. They do not wish to see her suffer.   Discussed comfort sips/bites but anticipate she will not tolerate much orally due to persistent bowel obstruction. Recommending continuation of NGT for symptoms. Family agrees.   Discussed hospice options. Family prefer hospice facility placement, as patient was living home prior to hospitalization. Also recommended hospice facility with concern she will have high symptom burden with SBO.   Family confirms decision for DNR/DNI code status. Allow nature to take course as  she nears EOL.  Discussed unrestricted visitor access.  Answered questions/concerns. Emotional/spiritual support provided. PMT contact information given.   *Updated RN, Dr. Zigmund Daniel, Dr. Charlotta Newton PA   SUMMARY OF RECOMMENDATIONS    [place] discussion with patient's husband. Son and DIL on speaker phone.   Family confirms DNR/DNI.  Family does not wish to pursue surgical intervention, sharing this will not impact her quality of life. They are ready for  shift to comfort measures.  Comfort care plan. Discontinued interventions not aimed at comfort.   Comfort sips if patient can tolerate.  Continue NGT to LIS for symptom management.  Symptom management medications--see below.  TOC for hospice facility placement when bed available and if patient is stable for transfer.   Unrestricted visitor access.    Code Status/Advance Care Planning:  DNR  Symptom Management:   Morphine 71m q6h scheduled for pain  Morphine 1-258mIV q2h prn breakthrough pain/dyspnea/air hunger/tachypnea  Zofran 47m52mV q8h scheduled for nausea/vomiting  Phenergin prn refractory n/v  Robinul 0.2mg23m q4h prn secretions  Ativan 0.5mg 8mq4h prn anxiety  Haldol 2mg I19m6h prn agitation  Voltaren gel QID prn BLE   Continue NGT to LIS for symptom management. If patient removes or becomes dislodged, do not replace. May require comfort continuous infusion if this occurs.   Palliative Prophylaxis:   Aspiration, Delirium Protocol, Frequent Pain Assessment, Oral Care and Turn Reposition  Additional Recommendations (Limitations, Scope, Preferences):  Full Comfort Care  Psycho-social/Spiritual:   Desire for further Chaplaincy support:yes  Additional Recommendations: Caregiving  Support/Resources, Compassionate Wean Education and Education on Hospice  Prognosis:   Weeks if not days with persistent small bowel obstruction despite medical management, poor surgical candidate, and underlying progressive dementia with failure to thrive.   Discharge Planning: Hospital death if quick decompensation vs. Hospice facility transfer with concern for high symptom burden.      Primary Diagnoses: Present on Admission: . Sepsis (HCC) .LimaD (peripheral artery disease) (HCC) .[place]rmocytic anemia . HTN (hypertension) . CKD (chronic kidney disease), stage III (HCC) .[place]olelithiasis . CAD (coronary artery disease) . Alzheimer's dementia with behavioral disturbance  (HCC)  [place] medical record, interviewed the patient and family, and examined the patient. The following aspects are pertinent.  Past Medical History:  Diagnosis Date  . Arthritis    "qwhere"  . Coronary artery disease   . Hypercholesteremia   . Hypertension   . Myocardial infarction (HCC) 2[place]  . SBO (small bowel obstruction) (HCC) 0[place]6/2018  . Stenosis of subclavian artery (HCC)  RochelleType II diabetes mellitus (HCC)  [place]ocial History   Socioeconomic History  . Marital status: Married    Spouse name: Not on file  . Number of children: 1  . Years of education: Not on file  . Highest education level: Not on file  Occupational History  . Not on file  Tobacco Use  . Smoking status: Never Smoker  . Smokeless tobacco: Never Used  Vaping Use  . Vaping Use: Never used  Substance and Sexual Activity  . Alcohol use: No  . Drug use: No  . Sexual activity: Not Currently  Other Topics Concern  . Not on file  Social History Narrative  . Not on file   Social Determinants of Health   Financial Resource Strain: Low Risk   . Difficulty of Paying Living Expenses: Not very hard  Food Insecurity: No Food Insecurity  . Worried About [employer]  Food in the Last Year: Never true  . Ran Out of Food in the Last Year: Never true  Transportation Needs: No Transportation Needs  . Lack of Transportation (Medical): No  . Lack of Transportation (Non-Medical): No  Physical Activity: Inactive  . Days of Exercise per Week: 0 days  . Minutes of Exercise per Session: 0 min  Stress: No Stress Concern Present  . Feeling of Stress : Only a little  Social Connections: Moderately Isolated  . Frequency of Communication with Friends and Family: More than three times a week  . Frequency of Social Gatherings with Friends and Family: Never  . Attends Religious Services: Never  . Active Member of Clubs or Organizations: No  . Attends Archivist Meetings: Never  . Marital  Status: Married   Family History  Problem Relation Age of Onset  . Diabetes Father   . Gallbladder disease Neg Hx    Scheduled Meds: .  morphine injection  1 mg Intravenous Q6H  . ondansetron (ZOFRAN) IV  4 mg Intravenous Q8H  . potassium chloride  20 mEq Per Tube BID   Continuous Infusions: PRN Meds:.acetaminophen **OR** acetaminophen, diclofenac Sodium, glycopyrrolate, haloperidol lactate, hydrALAZINE, labetalol, LORazepam, morphine injection, promethazine Medications Prior to Admission:  Prior to Admission medications   Medication Sig Start Date End Date Taking? Authorizing Provider  acetaminophen (TYLENOL) 500 MG tablet Take 500 mg by mouth every 6 (six) hours as needed.   Yes [provider]  alendronate (FOSAMAX) 70 MG tablet Take 70 mg by mouth every Monday.  12/08/14  Yes [provider]  ALPRAZolam Duanne Moron) 0.5 MG tablet Take 0.5 mg by mouth 3 (three) times daily as needed for anxiety.  10/01/19  Yes [provider]  amLODipine (NORVASC) 10 MG tablet Take 10 mg by mouth at bedtime.  10/12/14  Yes [provider]  aspirin EC 81 MG tablet Take 81 mg by mouth daily.    Yes [provider]  Cholecalciferol (VITAMIN D3) 50 MCG (2000 UT) TABS Take 1 tablet by mouth daily.   Yes [provider]  diclofenac Sodium (VOLTAREN) 1 % GEL Apply 4 g topically 4 (four) times daily. Patient taking differently: Apply 4 g topically 4 (four) times daily as needed (pain). 10/11/19  Yes Allie Bossier, MD  divalproex (DEPAKOTE) 125 MG DR tablet Take 125 mg by mouth at bedtime. 03/11/20  Yes [provider]  Ferrous Sulfate (IRON PO) Take 1 tablet by mouth daily.   Yes [provider]  gabapentin (NEURONTIN) 300 MG capsule Take 600 mg by mouth at bedtime.  04/09/17  Yes [provider]  hydrALAZINE (APRESOLINE) 100 MG tablet Take 1 tablet (100 mg total) by mouth 3 (three) times daily. Patient taking differently: Take 100 mg by mouth  daily. 10/11/19  Yes Allie Bossier, MD  lovastatin (MEVACOR) 40 MG tablet Take 1 tablet (40 mg total) by mouth every evening. 08/27/19  Yes Miquel Dunn, NP  metFORMIN (GLUCOPHAGE) 500 MG tablet Take 500 mg by mouth at bedtime. 03/30/20  Yes [provider]  sertraline (ZOLOFT) 25 MG tablet Take 25 mg by mouth daily. 03/11/20  Yes [provider]   Allergies  Allergen Reactions  . Statins Other (See Comments)    Myalgias   . Other     NO FOODS WITH SEEDS  . Crestor [Rosuvastatin] Other (See Comments)    Myalgias  . Lipitor [Atorvastatin] Other (See Comments)    myalgias  . Zocor [  Simvastatin] Other (See Comments)    myalgias   Review of Systems  Unable to perform ROS: Dementia   Physical Exam Vitals and nursing note reviewed.  Constitutional:      General: She is awake.     Appearance: She is ill-appearing.  HENT:     Head: Normocephalic and atraumatic.  Cardiovascular:     Rate and Rhythm: Tachycardia present.  Pulmonary:     Effort: No tachypnea, accessory muscle usage or respiratory distress.  Abdominal:     Comments: Tender, grabbing stomach. NGT clamped at beginning of visit but unfortunately patient vomited & RN placed back on LIS.   Skin:    General: Skin is warm and dry.  Neurological:     Mental Status: She is easily aroused.     Comments: Opens eyes to voice. Disoriented with baseline dementia. Does not follow commands.   Psychiatric:        Attention and Perception: She is inattentive.        Speech: She is noncommunicative.        Cognition and Memory: Cognition is impaired.    Vital Signs: BP (!) 145/56 (BP Location: Right Arm)   Pulse 62   Temp 98.2 F (36.8 C) (Oral)   Resp 16   Ht '5\' 2"'  (1.575 m)   Wt 48 kg   SpO2 97%   BMI 19.35 kg/m  Pain Scale: 0-10   Pain Score: 0-No pain (shook head no)   SpO2: SpO2: 97 % O2 Device:SpO2: 97 % O2 Flow Rate: .O2 Flow Rate (L/min): 2 L/min  IO: Intake/output summary:    Intake/Output Summary (Last 24 hours) at 09/01/2020 1446 Last data filed at 09/01/2020 0530 Gross per 24 hour  Intake 1000 ml  Output 450 ml  Net 550 ml    LBM: Last BM Date: 08/30/20 Baseline Weight: Weight: 51.7 kg Most recent weight: Weight: 48 kg     Palliative Assessment/Data: PPS 20%    Time Total: 90 Greater than 50%  of this time was spent counseling and coordinating care related to the above assessment and plan.  Signed by:  Ihor Dow, DNP, FNP-C Palliative Medicine Team  Phone: [phone] Fax: [phone]   Please contact Palliative Medicine Team phone at [phone] for questions and concerns.  For individual provider: See Shea Evans

## 2020-09-01 NOTE — Progress Notes (Signed)

## 2020-09-01 NOTE — Progress Notes (Signed)
PROGRESS NOTE    [place] M Guest  GEX:[id number] DOB: 1925/08/17 DOA: 08/27/2020 PCP: Alysia Penna, MD   Brief Narrative-87 year old female with medical history of small bowel obstruction, CKD stage IIIb, dementia, hyperlipidemia, diabetes mellitus type 2, CAD, hypertension presented with abdominal discomfort, nausea vomiting.  She has prior history of SBO.  In the ED CT scan showed high-grade small bowel obstruction, distended gallbladder with large gallstone patchy infiltrates at bases.  Cannot rule out achalasia.  General surgery was consulted, NG tube was inserted.   Assessment & Plan:   Active Problems:   HTN (hypertension)   DM (diabetes mellitus) (HCC)   CAD (coronary artery disease)   PAD (peripheral artery disease) (HCC)   SBO (small bowel obstruction) (HCC)   CKD (chronic kidney disease), stage III (HCC)   Cholelithiasis   Normocytic anemia   Alzheimer's dementia with behavioral disturbance (HCC)   Sepsis (HCC)    #1 small bowel obstruction patient still with NG tube with no significant improvement . surgery planning to clamp the NG tube Patient ambulated with physical therapy yesterday. kUB  with persistent small bowel obstruction. Surgery recommends n.p.o. IV fluids and continue NG tube. Trial of clamping today Discussed with son yesterday and waiting for his call back today.    #2 acute hypoxic respiratory failure from community-acquired pneumonia present on admission continue Rocephin and Flagyl.  As above admission she was 88% on room air.  CT showed pneumonia.  #3 history of chronic normocytic anemia stable.  #4 peripheral arterial disease stable  #5  Hypokalemia potassium 3.4 normal magnesium.  K being replaced by general surgery.  #6 history of essential hypertension on IV metoprolol and hydralazine as needed.increase metoprolol to 5 mg.  #7 type 2 diabetes on SSI.dc ssi. CBG (last 3)  Recent Labs    08/31/20 2356 09/01/20 0354  09/01/20 0748  GLUCAP 139* 136* 150*   #8 Goals of care dw family son Jomarie Longs and palliative care.  Nutrition Problem: Severe Malnutrition Etiology: chronic illness (Alzheimer's dementia, recurrent SBOs)     Signs/Symptoms: percent weight loss,severe fat depletion,severe muscle depletion    Interventions: Refer to RD note for recommendations  Estimated body mass index is 19.35 kg/m as calculated from the following:   Height as of this encounter: 5\' 2"  (1.575 m).   Weight as of this encounter: 48 kg.  DVT prophylaxis: scd Code Status: DNR Family Communication: None at bedside  disposition Plan:  Status is: Inpatient  Dispo: The patient is from: Home              Anticipated d/c is to: Home              Anticipated d/c date is: > 3 days              Patient currently is not medically stable to d/c.   Consultants: Surgery  Procedures: NG tube Antimicrobials: None  Subjective: Patient pleasantly confused NG tube in place no events overnight noted.   Objective: Vitals:   08/31/20 2042 08/31/20 2044 08/31/20 2213 09/01/20 0522  BP: (!) 200/60 (!) 200/70 (!) 178/68 (!) 195/69  Pulse: 62  60 60  Resp: 14   14  Temp: 98.3 F (36.8 C)   97.8 F (36.6 C)  TempSrc: Axillary   Oral  SpO2: 93%   98%  Weight:      Height:        Intake/Output Summary (Last 24 hours) at 09/01/2020 1317 Last data filed at 09/01/2020 0530  Gross per 24 hour  Intake 1000 ml  Output 450 ml  Net 550 ml   Filed Weights   08/27/20 1610 08/27/20 2351  Weight: 51.7 kg 48 kg    Examination:  General exam: Appears calm and comfortable  Respiratory system: Clear to auscultation. Respiratory effort normal. Cardiovascular system: S1 & S2 heard, RRR. No JVD, murmurs, rubs, gallops or clicks. No pedal edema. Gastrointestinal system: Abdomen is nondistended, soft and nontender. No organomegaly or masses felt.  High-pitched bowel sounds heard. Central nervous system: awake confused  Extremities:  Symmetric 5 x 5 power. Skin: No rashes, lesions or ulcers Psychiatry: Judgement and insight impaired   Data Reviewed: I have personally reviewed following labs and imaging studies  CBC: Recent Labs  Lab 08/27/20 1803 08/28/20 0635 08/29/20 0814 08/30/20 0558  WBC 12.5* 8.5 7.8 8.5  NEUTROABS 11.2* 6.9  --   --   HGB 10.5* 8.3* 8.9* 8.8*  HCT 34.2* 27.1* 28.8* 28.8*  MCV 86.4 87.1 87.8 86.0  PLT 133* 132* 132* 135*   Basic Metabolic Panel: Recent Labs  Lab 08/27/20 1803 08/28/20 0635 08/29/20 0814 08/30/20 0558 08/31/20 0558 09/01/20 0906  NA 144 144 147* 144 144 136  K 4.2 3.5 3.7 3.4* 3.4* 3.7  CL 102 107 103 99 99 93*  CO2 29 25 32 29 30 30   GLUCOSE 191* 129* 99 72 77 172*  BUN 33* 29* 21 22 18 16   CREATININE 1.27* 1.19* 1.09* 0.99 1.03* 0.95  CALCIUM 9.3 8.1* 8.8* 8.7* 8.6* 8.3*  MG 1.8 1.7  --  2.0 1.7  --   PHOS 4.1 3.2  --   --   --   --    GFR: Estimated Creatinine Clearance: 28.6 mL/min (by C-G formula based on SCr of 0.95 mg/dL). Liver Function Tests: Recent Labs  Lab 08/27/20 1803 08/28/20 0635 08/31/20 0558  AST 17 12* 19  ALT 8 7 9   ALKPHOS 64 45 46  BILITOT 0.7 0.5 0.8  PROT 7.0 5.3* 5.6*  ALBUMIN 3.6 2.9* 3.0*   No results for input(s): LIPASE, AMYLASE in the last 168 hours. No results for input(s): AMMONIA in the last 168 hours. Coagulation Profile: No results for input(s): INR, PROTIME in the last 168 hours. Cardiac Enzymes: Recent Labs  Lab 08/27/20 1803  CKTOTAL 48   BNP (last 3 results) No results for input(s): PROBNP in the last 8760 hours. HbA1C: No results for input(s): HGBA1C in the last 72 hours. CBG: Recent Labs  Lab 08/31/20 1614 08/31/20 2005 08/31/20 2356 09/01/20 0354 09/01/20 0748  GLUCAP 119* 139* 139* 136* 150*   Lipid Profile: No results for input(s): CHOL, HDL, LDLCALC, TRIG, CHOLHDL, LDLDIRECT in the last 72 hours. Thyroid Function Tests: No results for input(s): TSH, T4TOTAL, FREET4, T3FREE,  THYROIDAB in the last 72 hours. Anemia Panel: No results for input(s): VITAMINB12, FOLATE, FERRITIN, TIBC, IRON, RETICCTPCT in the last 72 hours. Sepsis Labs: Recent Labs  Lab 08/27/20 1803 08/27/20 1955 08/28/20 0031 08/28/20 0635  PROCALCITON <0.10  --   --   --   LATICACIDVEN 2.4* 2.2* 1.7 1.0    Recent Results (from the past 240 hour(s))  SARS Coronavirus 2 by RT PCR (hospital order, performed in [facility] hospital lab) Nasopharyngeal Nasopharyngeal Swab     Status: None   Collection Time: 08/27/20  4:21 PM   Specimen: Nasopharyngeal Swab  Result Value Ref Range Status   SARS Coronavirus 2 NEGATIVE NEGATIVE Final    Comment: (NOTE) SARS-CoV-2  target nucleic acids are NOT DETECTED.  The SARS-CoV-2 RNA is generally detectable in upper and lower respiratory specimens during the acute phase of infection. The lowest concentration of SARS-CoV-2 viral copies this assay can detect is 250 copies / mL. A negative result does not preclude SARS-CoV-2 infection and should not be used as the sole basis for treatment or other patient management decisions.  A negative result may occur with improper specimen collection / handling, submission of specimen other than nasopharyngeal swab, presence of viral mutation(s) within the areas targeted by this assay, and inadequate number of viral copies (<250 copies / mL). A negative result must be combined with clinical observations, patient history, and epidemiological information.  Fact Sheet for Patients:   BoilerBrush.com.cy  Fact Sheet for Healthcare Providers: https://pope.com/  This test is not yet approved or  cleared by the [place] FDA and has been authorized for detection and/or diagnosis of SARS-CoV-2 by FDA under an Emergency Use Authorization (EUA).  This EUA will remain in effect (meaning this test can be used) for the duration of the COVID-19 declaration under Section  564(b)(1) of the Act, 21 U.S.C. section 360bbb-3(b)(1), unless the authorization is terminated or revoked sooner.  Performed at [facility], [address], [place], [place] [address]   Blood culture (routine x 2)     Status: None   Collection Time: 08/27/20  6:03 PM   Specimen: BLOOD  Result Value Ref Range Status   Specimen Description   Final    BLOOD RIGHT ARM Performed at [facility], [address], [place], [place] [address]    Special Requests   Final    BOTTLES DRAWN AEROBIC AND ANAEROBIC Blood Culture adequate volume Performed at [facility], [address], [place], [place] [address]    Culture   Final    NO GROWTH 5 DAYS Performed at [facility], [address] [place], [place] [address]    Report Status 09/01/2020 FINAL  Final  Blood culture (routine x 2)     Status: None   Collection Time: 08/27/20  7:55 PM   Specimen: BLOOD  Result Value Ref Range Status   Specimen Description   Final    BLOOD RIGHT ARM Performed at [facility], [address], [place], [place] [address]    Special Requests   Final    BOTTLES DRAWN AEROBIC AND ANAEROBIC Blood Culture adequate volume Performed at [facility], [address] [place], [place] [address]    Culture   Final    NO GROWTH 5 DAYS Performed at [facility], [address], [place], [place] [address]    Report Status 09/01/2020 FINAL  Final  Urine culture     Status: Abnormal   Collection Time: 08/27/20 11:53 PM   Specimen: Urine, Random  Result Value Ref Range Status   Specimen Description   Final    URINE, RANDOM Performed at [facility], [address], [place], [place] [address]    Special Requests   Final    NONE Performed at [facility], [address], [place], [place] [address]    Culture MULTIPLE SPECIES PRESENT, SUGGEST RECOLLECTION (A)  Final    Report Status 08/29/2020 FINAL  Final         Radiology Studies: DG Abd 1 View  Result Date: 08/31/2020 CLINICAL DATA:  87 year old female with small bowel obstruction. EXAM: ABDOMEN - 1  VIEW COMPARISON:  08/30/2020 and earlier. FINDINGS: Portable AP supine view at 0750 hours. Stable enteric tube, side hole at the level of the gastric fundus. Continued gas distended mid and upper abdominal small bowel loops up to 45 mm diameter. Stomach does not appear dilated. Paucity of large bowel gas as before. Right lower quadrant surgical clips redemonstrated. Vascular calcifications. No acute osseous abnormality identified. Rim calcified bulky gallstone again evident in the right upper quadrant. IMPRESSION: 1. Unresolved small-bowel obstruction, probably with only modest improvement compared to the CT 08/27/2020. 2. Stable enteric tube which decompresses the stomach now. 3. Bulky rim calcified gallstone. Electronically Signed   By: Odessa Fleming M.D.   On: 08/31/2020 08:33        Scheduled Meds: . metoprolol tartrate  5 mg Intravenous Q8H  . potassium chloride  20 mEq Per Tube BID   Continuous Infusions: . dextrose 75 mL/hr at 09/01/20 0424     LOS: 5 days    Alwyn Ren, MD 09/01/2020, 1:17 PM

## 2020-09-01 NOTE — Progress Notes (Addendum)
Subjective: CC: Patient w/ 450cc out of NGT in the last 24 hours. Nurse reports she is tolerating clamping trials. Did have 1 episode in last 24 hours where unclamped and residual greater than . Unclear if any flatus. Last BM 1/26 (w/ Dulcolax?). Still confused this am which appears similar per notes.   Objective: Vital signs in last 24 hours: Temp:  [97.6 F (36.4 C)-98.3 F (36.8 C)] 97.8 F (36.6 C) (01/28 0522) Pulse Rate:  [56-62] 60 (01/28 0522) Resp:  [14-18] 14 (01/28 0522) BP: (167-207)/(57-127) 195/69 (01/28 0522) SpO2:  [93 %-98 %] 98 % (01/28 0522) Last BM Date: 08/30/20  Intake/Output from previous day: 01/27 0701 - 01/28 0700 In: 1000 [I.V.:1000] Out: 450 [Emesis/NG output:450] Intake/Output this shift: No intake/output data recorded.  PE: Gen: Awake and in NAD Abd: Soft, ND, no rigidity or guarding. +BS. NGT to LIWS w/ green gastric fluid.   Lab Results:  Recent Labs    08/29/20 0814 08/30/20 0558  WBC 7.8 8.5  HGB 8.9* 8.8*  HCT 28.8* 28.8*  PLT 132* 135*   BMET Recent Labs    08/30/20 0558 08/31/20 0558  NA 144 144  K 3.4* 3.4*  CL 99 99  CO2 29 30  GLUCOSE 72 77  BUN 22 18  CREATININE 0.99 1.03*  CALCIUM 8.7* 8.6*   PT/INR No results for input(s): LABPROT, INR in the last 72 hours. CMP     Component Value Date/Time   NA 144 08/31/2020 0558   K 3.4 (L) 08/31/2020 0558   CL 99 08/31/2020 0558   CO2 30 08/31/2020 0558   GLUCOSE 77 08/31/2020 0558   BUN 18 08/31/2020 0558   CREATININE 1.03 (H) 08/31/2020 0558   CALCIUM 8.6 (L) 08/31/2020 0558   PROT 5.6 (L) 08/31/2020 0558   ALBUMIN 3.0 (L) 08/31/2020 0558   AST 19 08/31/2020 0558   ALT 9 08/31/2020 0558   ALKPHOS 46 08/31/2020 0558   BILITOT 0.8 08/31/2020 0558   GFRNONAA 51 (L) 08/31/2020 0558   GFRAA 33 (L) 10/11/2019 0321   Lipase     Component Value Date/Time   LIPASE 20 07/15/2020 1719       Studies/Results: DG Abd 1 View  Result Date:  08/31/2020 CLINICAL DATA:  87 year old female with small bowel obstruction. EXAM: ABDOMEN - 1 VIEW COMPARISON:  08/30/2020 and earlier. FINDINGS: Portable AP supine view at 0750 hours. Stable enteric tube, side hole at the level of the gastric fundus. Continued gas distended mid and upper abdominal small bowel loops up to 45 mm diameter. Stomach does not appear dilated. Paucity of large bowel gas as before. Right lower quadrant surgical clips redemonstrated. Vascular calcifications. No acute osseous abnormality identified. Rim calcified bulky gallstone again evident in the right upper quadrant. IMPRESSION: 1. Unresolved small-bowel obstruction, probably with only modest improvement compared to the CT 08/27/2020. 2. Stable enteric tube which decompresses the stomach now. 3. Bulky rim calcified gallstone. Electronically Signed   By: Odessa Fleming M.D.   On: 08/31/2020 08:33    Anti-infectives: Anti-infectives (From admission, onward)   Start     Dose/Rate Route Frequency Ordered Stop   08/28/20 2100  cefTRIAXone (ROCEPHIN) 2 g in sodium chloride 0.9 % 100 mL IVPB  Status:  Discontinued        2 g 200 mL/hr over 30 Minutes Intravenous Every 24 hours 08/27/20 2211 08/30/20 1317   08/28/20 0600  metroNIDAZOLE (FLAGYL) IVPB 500 mg  Status:  Discontinued  500 mg 100 mL/hr over 60 Minutes Intravenous Every 8 hours 08/27/20 2211 08/30/20 1317   08/27/20 2100  cefTRIAXone (ROCEPHIN) 2 g in sodium chloride 0.9 % 100 mL IVPB       "And" Linked Group Details   2 g 200 mL/hr over 30 Minutes Intravenous  Once 08/27/20 2052 08/27/20 2121   08/27/20 2100  metroNIDAZOLE (FLAGYL) IVPB 500 mg       "And" Linked Group Details   500 mg 100 mL/hr over 60 Minutes Intravenous  Once 08/27/20 2052 08/27/20 2154       Assessment/Plan Acute respiratory failure with hypoxia PAD (peripheral artery disease) (HCC) Normocytic anemia -H/H 8.8/28.8 (1/26) Thrombocytopenia-Platelets>>135K (1/26) HTN CKDstage III -   Creatinine 1.09>>.99>>1.03 (1/27) Cholelithiasis CAD(coronary artery disease) Alzheimer's dementia Type II DM Gallstones - CT w/ distended gallbladder with large gallstone. Moderate intra and extrahepatic bile duct dilatation. No common duct stones are identified.LFT wnl1/27. No RUQ tenderness. DNR Moderate malnutrition -Prealbumin 17.1 - Per TRH -   Small bowel obstruction - CT 1/23 with high-grade small bowel obstruction with transition zone in the upper mid abdomen. - Recurrent problem, likely due to adhesions from prior surgery (prior abdominal hysterectomy and ex lap w/ SBR & appendectomy) - Tolerated clamping 4 hours on 2 hours off for the last 24 hours. She did have one episode where the residual was greater than . Will clamp for 24 hours today (continuous). Discussed with RN to rehook to [facility] if she develops abdominal pain, distension, n/v. Will leave NPO given increased risk for aspiration with tube clampled and PO's.   - Xray and labs in AM - Long term GOC needs to be discussed. This is recurrent and at some point (even this admission) surgery might be indicated.When family are present, please let our team know so Dr. Gerrit Friends can discuss this with family. It appears the primary team have consulted palliative care team. Will discuss with RN to let us know if they are able to get in contact with family.  - Keep K >4 and Mg >2 for bowel function - Cont to mobilize for bowel funciton  FEN -NPO, NGT clamped. IVF VTE -SCDs, okay for chemical prophylaxis from a general surgery standpoint ID -None currently.    LOS: 5 days    Jacinto Halim , [facility] 09/01/2020, 7:56 AM Please see Amion for pager number during day hours 7:00am-4:30pm

## 2020-09-01 NOTE — Progress Notes (Signed)
Occupational Therapy Treatment Patient Details Name: [place] Sonya Chavez MRN: [id number] DOB: 07/27/1926 Today's Date: 09/01/2020    History of present illness Patient is a 87 y.o. female who presented to [facility] with Rt abdominal pain, nausea, and emesis with PMH significant for HTN, HLD, CAD s/p CABG, DMII, MI, OA, small bowel obstruction with history of resection.   OT comments  Patient much more flat today compared to previous OT session, requiring max encouragement from OT and her spouse to participate. Pt needing max A to sit up at EOB, able to scoot herself to EOB however despite multiple attempts would not stand/transfer. Pt max A to engage in g/h tasks at EOB. Ultimately patient mod A to return to bed, RN present and informed RN patient holding abdomen during bed mob.   Follow Up Recommendations  Home health OT;Supervision/Assistance - 24 hour    Equipment Recommendations  None recommended by OT       Precautions / Restrictions Precautions Precautions: Fall Precaution Comments: NGT       Mobility Bed Mobility Overal bed mobility: Needs Assistance Bed Mobility: Supine to Sit;Sit to Supine     Supine to sit: Max assist Sit to supine: Mod assist   General bed mobility comments: limited initiation, needs max A to bring LEs and trunk upright. patient scooting herself to EOB. with cues initiate laying onto bed with mod A for LE management  Transfers                 General transfer comment: despite max encouragement from OT/pt's spouse she would not stand from EOB, doing well scooting toward EOB without assist but resistant to standing    Balance Overall balance assessment: Needs assistance Sitting-balance support: No upper extremity supported;Feet supported Sitting balance-Leahy Scale: Good                                     ADL either performed or assessed with clinical judgement   ADL Overall ADL's : Needs assistance/impaired      Grooming: Wash/dry face;Brushing hair;Maximal assistance;Sitting Grooming Details (indicate cue type and reason): spouse brush patient's hair, required max cues/encouragement from spouse and OT to wash face pt performed ~25%                                               Cognition Arousal/Alertness: Awake/alert Behavior During Therapy: Flat affect Overall Cognitive Status: History of cognitive impairments - at baseline                                 General Comments: patient more flat and even less communicative this session, despite max encouragement from OT and pt's spouse would not stand from EOB              General Comments pt on RA    Pertinent Vitals/ Pain       Pain Assessment: Faces Faces Pain Scale: Hurts little more Pain Location: abdomen Pain Descriptors / Indicators: Guarding;Grimacing Pain Intervention(s): Other (comment) (notified RN)         Frequency  Min 2X/week        Progress Toward Goals  OT Goals(current goals can now be found in the care plan section)  Progress towards OT goals: Not progressing toward goals - comment (pt disposition, less interactive)  Acute Rehab OT Goals Patient Stated Goal: husband would like patient home and continue with [facility] services OT Goal Formulation: With family Time For Goal Achievement: 09/11/20 Potential to Achieve Goals: Fair ADL Goals Pt Will Perform Grooming: with set-up;with supervision;standing Pt Will Perform Upper Body Bathing: with set-up;with supervision;sitting Pt Will Perform Lower Body Bathing: with set-up;with supervision;sit to/from stand Pt Will Perform Upper Body Dressing: with set-up;with supervision;sitting Pt Will Perform Lower Body Dressing: with set-up;with supervision;sit to/from stand Pt Will Transfer to Toilet: with supervision;ambulating;bedside commode Pt Will Perform Toileting - Clothing Manipulation and hygiene: with supervision;sit to/from  stand Additional ADL Goal #1: Pt will be S in and OOB for basic ADLs  Plan Discharge plan remains appropriate       AM-PAC OT "6 Clicks" Daily Activity     Outcome Measure   Help from another person eating meals?: Total (NPO) Help from another person taking care of personal grooming?: A Lot Help from another person toileting, which includes using toliet, bedpan, or urinal?: Total Help from another person bathing (including washing, rinsing, drying)?: A Lot Help from another person to put on and taking off regular upper body clothing?: A Lot Help from another person to put on and taking off regular lower body clothing?: Total 6 Click Score: 9    End of Session  OT Visit Diagnosis: Unsteadiness on feet (R26.81);Other abnormalities of gait and mobility (R26.89);Other symptoms and signs involving cognitive function   Activity Tolerance Other (comment) (Treatment limited secondary to pt disposition)   Patient Left in bed;with call bell/phone within reach;with bed alarm set;with family/visitor present;with nursing/sitter in room;Other (comment) (mits donned)   Nurse Communication Mobility status;Other (comment) (pt holding abdomen)        Time: 8115-7262 OT Time Calculation (min): 25 min  Charges: OT General Charges $OT Visit: 1 Visit OT Treatments $Self Care/Home Management : 23-37 mins  Sonya Chavez OT OT pager: [phone]   Sonya Chavez 09/01/2020, 12:40 PM

## 2020-09-01 NOTE — Progress Notes (Signed)
Chaplain engaged in initial visit with Sonya Chavez.  Sonya Chavez was not responsive to chaplain's questions but when asked for prayer she did shake her head "yes."  Chaplain offered prayer and support over Sonya Chavez.    Chaplain will follow-up with Mr. Suleiman who was not present at the time and offer support.    09/01/20 1600  Clinical Encounter Type  Visited With Patient  Visit Type Initial;Spiritual support

## 2020-09-01 NOTE — Progress Notes (Signed)
SLP Cancellation Note  Patient Details Name: Sonya Chavez MRN: [id number] DOB: [birth year]   Cancelled treatment:       Reason Eval/Treat Not Completed: Medical issues which prohibited therapyPer PA-C, "Clamp trial x 24 hours today. No PO's given increased risk for aspiration with tube clampled and PO's.". Will follow for PO readiness.  Yuniel Blaney B. Murvin Natal, [facility], CCC-SLP Speech Language Pathologist Office: [phone] Pager: [phone]  Leigh Aurora 09/01/2020, 9:59 AM

## 2020-09-01 NOTE — Progress Notes (Signed)
PMT consult received and chart reviewed. Discussed with Dr. Zigmund Daniel. Met with husband in family conference room along with son and daughter-in-law on speaker phone. Discussed in detail course of hospitalization including diagnoses, interventions, plan of care, poor candidacy for surgery due to age/frailty/dementia, and poor prognosis. Family understands her condition and prognosis. They do not wish to pursue surgical intervention, feeling this will not impact her quality of life. They are ready for shift to comfort measures and wish for her to rest peacefully. They are agreeable to hospice facility transfer when bed available.   Comfort orders placed. Keep NGT in for comfort/symptom management. If she removes, do not replace. TOC for residential hospice.   Updated Dr. Zigmund Daniel, RN, and Dr. Harlow Asa.  Full palliative note to follow.  NO CHARGE  Ihor Dow, [place], FNP-C Palliative Medicine Team  Phone: [phone] Fax: [phone]

## 2020-09-01 NOTE — Patient Outreach (Signed)
[facility]) Care Management  09/01/2020  [place] M Rigaud 1925-11-03 [id number]   Voice message received from daughter in law, [name] ([phone]) regarding member's hospitalization.  [facility] to discuss with her and husband (member's son) the status of member's condition and potential need for palliative care involvement as member is not a surgical candidate.  Spoke with [name], made aware that member is already involved with [facility] for palliative care.  She verbalizes understanding, state they are concerned about member having adequate care if she is to return home with husband.  Son and daughter in law have discussed this with member's husband, interested in placement if possible.  Hospital liaisons notified of admission and request, this care manager will follow up with family pending disposition.  Kemper Durie, [place], MSN [facility] Care Management  [facility] Manager [phone]

## 2020-09-02 DIAGNOSIS — G309 Alzheimer's disease, unspecified: Secondary | ICD-10-CM | POA: Diagnosis not present

## 2020-09-02 DIAGNOSIS — K56609 Unspecified intestinal obstruction, unspecified as to partial versus complete obstruction: Secondary | ICD-10-CM | POA: Diagnosis not present

## 2020-09-02 DIAGNOSIS — Z515 Encounter for palliative care: Secondary | ICD-10-CM | POA: Diagnosis not present

## 2020-09-02 DIAGNOSIS — A419 Sepsis, unspecified organism: Secondary | ICD-10-CM | POA: Diagnosis not present

## 2020-09-02 NOTE — Progress Notes (Signed)
Daily Progress Note   Patient Name: Sonya Chavez       Date: 09/02/2020 DOB: 16-Oct-1925  Age: 87 y.o. MRN#: [id number] Attending Physician: Alwyn Ren, MD Primary Care Physician: Alysia Penna, MD Admit Date: 08/27/2020  Reason for Consultation/Follow-up: Establishing goals of care  Subjective: I saw and examined Sonya Chavez today.  She appears comfortable on exam.  Length of Stay: 6  Current Medications: Scheduled Meds:  .  morphine injection  1 mg Intravenous Q6H  . ondansetron (ZOFRAN) IV  4 mg Intravenous Q8H    Continuous Infusions:   PRN Meds: acetaminophen **OR** acetaminophen, diclofenac Sodium, glycopyrrolate, haloperidol lactate, hydrALAZINE, labetalol, LORazepam, morphine injection, promethazine  Physical Exam         General: Alert, does not verbalize, in no acute distress.  HEENT: No bruits, no goiter, no JVD, NGT in place Heart: Tachycardic. No murmur appreciated. Lungs: Good air movement, clear Abdomen: tender, nondistended, positive bowel sounds.  Ext: No significant edema Skin: Warm and dry    Vital Signs: BP (!) 179/72   Pulse 72   Temp 98 F (36.7 C) (Oral)   Resp 16   Ht 5\' 2"  (1.575 m)   Wt 48 kg   SpO2 93%   BMI 19.35 kg/m  SpO2: SpO2: 93 % O2 Device: O2 Device: Room Air O2 Flow Rate: O2 Flow Rate (L/min): 2 L/min  Intake/output summary:   Intake/Output Summary (Last 24 hours) at 09/02/2020 1424 Last data filed at 09/02/2020 0800 Gross per 24 hour  Intake 40 ml  Output 125 ml  Net -85 ml   LBM: Last BM Date: 08/30/20 Baseline Weight: Weight: 51.7 kg Most recent weight: Weight: 48 kg       Palliative Assessment/Data:      Patient Active Problem List   Diagnosis Date Noted  . Sepsis (HCC) 08/27/2020   . Cholelithiasis with acute cholecystitis 07/16/2020  . S/P right hip fracture 05/14/2020  . DNR (do not resuscitate) 10/07/2019  . Alzheimer's dementia with behavioral disturbance (HCC)   . Palliative care encounter   . Failure to thrive (0-17) 10/06/2019  . Weakness generalized 10/05/2019  . Hypernatremia 07/18/2019  . Hypokalemia 07/18/2019  . Elevated troponin 07/17/2019  . Acute lower UTI 07/17/2019  . Positive D dimer 07/17/2019  . Normocytic anemia 07/17/2019  .  Cholelithiasis 01/02/2019  . CKD (chronic kidney disease), stage III (HCC) 10/21/2017  . Delirium due to another medical condition 12/18/2016  . Small bowel obstruction (HCC) 12/18/2016  . 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 Leukocytosis 12/18/2016  . Peripheral artery disease (HCC) 11/08/2014  . PAD (peripheral artery disease) (HCC) 10/24/2014  . LBBB (left bundle branch block) 09/27/2014  . Subclavian artery stenosis, left (HCC) 09/27/2014  . Numbness and tingling in left hand 03/08/2013  . HTN (hypertension) 03/08/2013  . DM (diabetes mellitus) (HCC) 03/08/2013  . CAD (coronary artery disease) 03/08/2013    Palliative Care Assessment & Plan   Patient Profile: 87 y.o. female  with past medical history of dementia, hospitalization for small bowel obstruction Dec 2021, CKD stage IIIb, HLD, DM type 2, CAD, HTN admitted on 08/27/2020 with abdominal discomfort, nausea, vomiting. In ED, CT scan revealed high-grade small bowel obstruction, distended gallbladder with large gallstone patchy infiltrates at bases, cannot rule out achalasia. General surgery following. NGT placed. Patient is not tolerating clamping trials. Poor surgical candidate due to age, frailty, dementia. Palliative medicine consultation for goals of care.   Recommendations/Plan:  - Full comfort care.  She appears to be symptomatically well controlled on my exam today.  Continue current regimen. - Continue to monitor with plan for transition to  residential hospice if she remains stable to do so when bed is available. - Continue NG tube to low intermittent suction for symptom management.  If she does dislodges, however, I would not replace. - Palliative will continue to follow peripherally.  Please call if there are specific needs or uncontrolled symptoms with which he can be of assistance.  Goals of Care and Additional Recommendations:  Limitations on Scope of Treatment: Full Comfort Care  Code Status:    Code Status Orders  (From admission, onward)         Start     Ordered   08/27/20 2257  Do not attempt resuscitation (DNR)  Continuous       Question Answer Comment  In the event of cardiac or respiratory ARREST Do not call a "code blue"   In the event of cardiac or respiratory ARREST Do not perform Intubation, CPR, defibrillation or ACLS   In the event of cardiac or respiratory ARREST Use medication by any route, position, wound care, and other measures to relive pain and suffering. May use oxygen, suction and manual treatment of airway obstruction as needed for comfort.      08/27/20 2256        Code Status History    Date Active Date Inactive Code Status Order ID Comments User Context   07/17/2020 1124 07/20/2020 2001 DNR [id number]  Dimple Nanas, MD Inpatient   07/16/2020 1121 07/17/2020 1124 Full Code [id number]  Clydie Braun, MD ED   05/13/2020 1503 05/16/2020 2138 Full Code [id number]  Teddy Spike, DO Inpatient   10/06/2019 1340 10/12/2019 0120 DNR [id number]  Hughie Closs, MD Inpatient   10/05/2019 2203 10/06/2019 1340 Full Code [id number]  Rometta Emery, MD ED   10/05/2019 2040 10/05/2019 2202 DNR [id number]  Rometta Emery, MD ED   07/17/2019 0635 07/20/2019 1618 Full Code [id number]  Pearson Grippe, MD ED   12/29/2018 1854 01/05/2019 1723 Full Code [id number]  Pearson Grippe, MD ED   10/21/2017 0034 10/24/2017 1735 Full Code [id number]  Hillary Bow, DO ED   12/18/2016 0259 12/25/2016 2035 Full Code [id number]  Hillary Bow, DO ED   11/09/2014 1906  11/10/2014 1536 Full Code [id number]  Yates Decamp, MD Inpatient   11/08/2014 1617 11/09/2014 1906 Full Code [id number]  Yates Decamp, MD Inpatient   10/25/2014 1228 10/26/2014 1305 Full Code [id number]  Yates Decamp, MD Inpatient   10/24/2014 1207 10/25/2014 1228 Full Code [id number]  Yates Decamp, MD Inpatient   09/27/2014 0955 09/27/2014 1756 Full Code [id number]  Yates Decamp, MD Inpatient   03/08/2013 1825 03/09/2013 2243 Full Code [id number]  Saralyn Pilar, DO Inpatient   Advance Care Planning Activity       Prognosis:   < 2 weeks  Discharge Planning:  Hospice facility  Care plan was discussed with bedside staff, Dr. Ashley Royalty  Thank you for allowing the Palliative Medicine Team to assist in the care of this patient.   Time In: 0900 Time Out: 0920 Total Time 20 Prolonged Time Billed No      Greater than 50%  of this time was spent counseling and coordinating care related to the above assessment and plan.  Romie Minus, MD  Please contact Palliative Medicine Team phone at [phone] for questions and concerns.

## 2020-09-02 NOTE — Progress Notes (Signed)
PROGRESS NOTE    [place] M Ballman  KKX:[id number] DOB: 1925-11-24 DOA: 08/27/2020 PCP: Alysia Penna, MD   Brief Narrative-87 year old female with medical history of small bowel obstruction, CKD stage IIIb, dementia, hyperlipidemia, diabetes mellitus type 2, CAD, hypertension presented with abdominal discomfort, nausea vomiting.  She has prior history of SBO.  In the ED CT scan showed high-grade small bowel obstruction, distended gallbladder with large gallstone patchy infiltrates at bases.  Cannot rule out achalasia.  General surgery was consulted, NG tube was inserted.   Assessment & Plan:   Active Problems:   HTN (hypertension)   DM (diabetes mellitus) (HCC)   CAD (coronary artery disease)   PAD (peripheral artery disease) (HCC)   Small bowel obstruction (HCC)   CKD (chronic kidney disease), stage III (HCC)   Cholelithiasis   Normocytic anemia   Alzheimer's dementia with behavioral disturbance (HCC)   Sepsis (HCC)    #1 small bowel obstruction -appreciate palliative care input.  Patient now made comfort care.  She is at too high risk to undergo any surgical procedure at this time.  Family is aware of it.  #2 acute hypoxic respiratory failure from community-acquired pneumonia present on admission was treated with Rocephin and Flagyl.    #3 history of chronic normocytic anemia stable.  #4 peripheral arterial disease stable  #5  Hypokalemia potassium 3.4 normal magnesium.  K being replaced by general surgery.  #6 history of essential hypertension   #7 type 2 diabetes  #8 Goals of care dw family-patient is now comfort care and was DNR on admission.   Nutrition Problem: Severe Malnutrition Etiology: chronic illness (Alzheimer's dementia, recurrent SBOs)  Signs/Symptoms: percent weight loss,severe fat depletion,severe muscle depletion    Interventions: Refer to RD note for recommendations  Estimated body mass index is 19.35 kg/m as calculated from the  following:   Height as of this encounter: 5\' 2"  (1.575 m).   Weight as of this encounter: 48 kg.  DVT prophylaxis: none comfort care Code Status: DNR Family Communication: None at bedside  disposition Plan:  Status is: Inpatient  Dispo: The patient is from: Home              Anticipated d/c is to: residential hospice              Anticipated d/c date is: > 3 days              Patient currently is not medically stable to d/c.   Consultants: Surgery  Procedures: NG tube Antimicrobials: None  Subjective: Patient pleasantly confused NG tube in place no events overnight noted.   Objective: Vitals:   09/01/20 0522 09/01/20 1334 09/01/20 1400 09/02/20 0407  BP: (!) 195/69 (!) 175/64 (!) 145/56 (!) 179/72  Pulse: 60 60 62 72  Resp: 14 16  16   Temp: 97.8 F (36.6 C) 98.2 F (36.8 C)  98 F (36.7 C)  TempSrc: Oral Oral  Oral  SpO2: 98% 97%  93%  Weight:      Height:        Intake/Output Summary (Last 24 hours) at 09/02/2020 1202 Last data filed at 09/02/2020 0800 Gross per 24 hour  Intake 40 ml  Output 125 ml  Net -85 ml   Filed Weights   08/27/20 1610 08/27/20 2351  Weight: 51.7 kg 48 kg    Examination:  General exam: Appears calm and comfortable  Respiratory system: Clear to auscultation. Respiratory effort normal. Cardiovascular system: S1 & S2 heard, RRR. No JVD, murmurs,  rubs, gallops or clicks. No pedal edema. Gastrointestinal system: Abdomen is nondistended, soft and nontender. No organomegaly or masses felt.  High-pitched bowel sounds heard. Central nervous system: awake confused  Extremities:no edema Skin: No rashes, lesions or ulcers Psychiatry: Judgement and insight impaired   Data Reviewed: I have personally reviewed following labs and imaging studies  CBC: Recent Labs  Lab 08/27/20 1803 08/28/20 0635 08/29/20 0814 08/30/20 0558  WBC 12.5* 8.5 7.8 8.5  NEUTROABS 11.2* 6.9  --   --   HGB 10.5* 8.3* 8.9* 8.8*  HCT 34.2* 27.1* 28.8* 28.8*  MCV  86.4 87.1 87.8 86.0  PLT 133* 132* 132* 135*   Basic Metabolic Panel: Recent Labs  Lab 08/27/20 1803 08/28/20 0635 08/29/20 0814 08/30/20 0558 08/31/20 0558 09/01/20 0906  NA 144 144 147* 144 144 136  K 4.2 3.5 3.7 3.4* 3.4* 3.7  CL 102 107 103 99 99 93*  CO2 29 25 32 29 30 30   GLUCOSE 191* 129* 99 72 77 172*  BUN 33* 29* 21 22 18 16   CREATININE 1.27* 1.19* 1.09* 0.99 1.03* 0.95  CALCIUM 9.3 8.1* 8.8* 8.7* 8.6* 8.3*  MG 1.8 1.7  --  2.0 1.7  --   PHOS 4.1 3.2  --   --   --   --    GFR: Estimated Creatinine Clearance: 28.6 mL/min (by C-G formula based on SCr of 0.95 mg/dL). Liver Function Tests: Recent Labs  Lab 08/27/20 1803 08/28/20 0635 08/31/20 0558  AST 17 12* 19  ALT 8 7 9   ALKPHOS 64 45 46  BILITOT 0.7 0.5 0.8  PROT 7.0 5.3* 5.6*  ALBUMIN 3.6 2.9* 3.0*   No results for input(s): LIPASE, AMYLASE in the last 168 hours. No results for input(s): AMMONIA in the last 168 hours. Coagulation Profile: No results for input(s): INR, PROTIME in the last 168 hours. Cardiac Enzymes: Recent Labs  Lab 08/27/20 1803  CKTOTAL 48   BNP (last 3 results) No results for input(s): PROBNP in the last 8760 hours. HbA1C: No results for input(s): HGBA1C in the last 72 hours. CBG: Recent Labs  Lab 08/31/20 2005 08/31/20 2356 09/01/20 0354 09/01/20 0748 09/01/20 1331  GLUCAP 139* 139* 136* 150* 199*   Lipid Profile: No results for input(s): CHOL, HDL, LDLCALC, TRIG, CHOLHDL, LDLDIRECT in the last 72 hours. Thyroid Function Tests: No results for input(s): TSH, T4TOTAL, FREET4, T3FREE, THYROIDAB in the last 72 hours. Anemia Panel: No results for input(s): VITAMINB12, FOLATE, FERRITIN, TIBC, IRON, RETICCTPCT in the last 72 hours. Sepsis Labs: Recent Labs  Lab 08/27/20 1803 08/27/20 1955 08/28/20 0031 08/28/20 0635  PROCALCITON <0.10  --   --   --   LATICACIDVEN 2.4* 2.2* 1.7 1.0    Recent Results (from the past 240 hour(s))  SARS Coronavirus 2 by RT PCR (hospital  order, performed in [facility] hospital lab) Nasopharyngeal Nasopharyngeal Swab     Status: None   Collection Time: 08/27/20  4:21 PM   Specimen: Nasopharyngeal Swab  Result Value Ref Range Status   SARS Coronavirus 2 NEGATIVE NEGATIVE Final    Comment: (NOTE) SARS-CoV-2 target nucleic acids are NOT DETECTED.  The SARS-CoV-2 RNA is generally detectable in upper and lower respiratory specimens during the acute phase of infection. The lowest concentration of SARS-CoV-2 viral copies this assay can detect is 250 copies / mL. A negative result does not preclude SARS-CoV-2 infection and should not be used as the sole basis for treatment or other patient management decisions.  A negative result may occur with improper specimen collection / handling, submission of specimen other than nasopharyngeal swab, presence of viral mutation(s) within the areas targeted by this assay, and inadequate number of viral copies (<250 copies / mL). A negative result must be combined with clinical observations, patient history, and epidemiological information.  Fact Sheet for Patients:   BoilerBrush.com.cy  Fact Sheet for Healthcare Providers: https://pope.com/  This test is not yet approved or  cleared by the [place] FDA and has been authorized for detection and/or diagnosis of SARS-CoV-2 by FDA under an Emergency Use Authorization (EUA).  This EUA will remain in effect (meaning this test can be used) for the duration of the COVID-19 declaration under Section 564(b)(1) of the Act, 21 U.S.C. section 360bbb-3(b)(1), unless the authorization is terminated or revoked sooner.  Performed at [facility], [address], [place], [place] [address]   Blood culture (routine x 2)     Status: None   Collection Time: 08/27/20  6:03 PM   Specimen: BLOOD  Result Value Ref Range Status   Specimen Description   Final    BLOOD RIGHT  ARM Performed at [facility], [address], [place], [place] [address]    Special Requests   Final    BOTTLES DRAWN AEROBIC AND ANAEROBIC Blood Culture adequate volume Performed at [facility], [address] [place], [place] [address]    Culture   Final    NO GROWTH 5 DAYS Performed at [facility], [address] [place], [place] [address]    Report Status 09/01/2020 FINAL  Final  Blood culture (routine x 2)     Status: None   Collection Time: 08/27/20  7:55 PM   Specimen: BLOOD  Result Value Ref Range Status   Specimen Description   Final    BLOOD RIGHT ARM Performed at [facility], [address], [place], [place] [address]    Special Requests   Final    BOTTLES DRAWN AEROBIC AND ANAEROBIC Blood Culture adequate volume Performed at [facility], [address] [place], [place] [address]    Culture   Final    NO GROWTH 5 DAYS Performed at [facility], [address], [place], [place] [address]    Report Status 09/01/2020 FINAL  Final  Urine culture     Status: Abnormal   Collection Time: 08/27/20 11:53 PM   Specimen: Urine, Random  Result Value Ref Range Status   Specimen Description   Final    URINE, RANDOM Performed at [facility], [address], [place], [place] [address]    Special Requests   Final    NONE Performed at [facility], [address], [place], [place] [address]    Culture MULTIPLE SPECIES PRESENT, SUGGEST RECOLLECTION (A)  Final   Report Status 08/29/2020 FINAL  Final         Radiology Studies: No results found.      Scheduled Meds: .  morphine injection  1 mg Intravenous Q6H  . ondansetron (ZOFRAN) IV  4 mg Intravenous Q8H  . potassium chloride  20 mEq Per Tube BID   Continuous Infusions:    LOS: 6 days    Alwyn Ren, MD 09/02/2020, 12:02 PM

## 2020-09-03 DIAGNOSIS — K56609 Unspecified intestinal obstruction, unspecified as to partial versus complete obstruction: Secondary | ICD-10-CM | POA: Diagnosis not present

## 2020-09-03 DIAGNOSIS — G309 Alzheimer's disease, unspecified: Secondary | ICD-10-CM | POA: Diagnosis not present

## 2020-09-03 DIAGNOSIS — Z7189 Other specified counseling: Secondary | ICD-10-CM | POA: Diagnosis not present

## 2020-09-03 DIAGNOSIS — R109 Unspecified abdominal pain: Secondary | ICD-10-CM | POA: Diagnosis not present

## 2020-09-03 MED ORDER — LIP MEDEX EX OINT: TOPICAL_OINTMENT | CUTANEOUS | Status: DC | PRN

## 2020-09-03 MED ORDER — LIP MEDEX EX OINT
TOPICAL_OINTMENT | CUTANEOUS | Status: DC | PRN
  Filled 2020-09-03: qty 7

## 2020-09-03 NOTE — Progress Notes (Signed)
PROGRESS NOTE    [place] M Conway  GLO:[id number] DOB: [birth year] DOA: 08/27/2020 PCP: Alysia Penna, MD   Brief Narrative-87 year old female with medical history of small bowel obstruction, CKD stage IIIb, dementia, hyperlipidemia, diabetes mellitus type 2, CAD, hypertension presented with abdominal discomfort, nausea vomiting.  She has prior history of SBO.  In the ED CT scan showed high-grade small bowel obstruction, distended gallbladder with large gallstone patchy infiltrates at bases.  Cannot rule out achalasia.  General surgery was consulted, NG tube was inserted.   Assessment & Plan:   Active Problems:   HTN (hypertension)   DM (diabetes mellitus) (HCC)   CAD (coronary artery disease)   PAD (peripheral artery disease) (HCC)   Small bowel obstruction (HCC)   CKD (chronic kidney disease), stage III (HCC)   Cholelithiasis   Normocytic anemia   Alzheimer's dementia with behavioral disturbance (HCC)   Sepsis (HCC)    #1 small bowel obstruction -appreciate palliative care input.  Patient now made comfort care.  She is at too high risk to undergo any surgical procedure at this time.  Family is aware of it.  #2 acute hypoxic respiratory failure from community-acquired pneumonia present on admission was treated with Rocephin and Flagyl.    #3 history of chronic normocytic anemia stable.  #4 peripheral arterial disease stable  #5  Hypokalemia potassium 3.4 normal magnesium.  K being replaced by general surgery.  #6 history of essential hypertension   #7 type 2 diabetes  #8 Goals of care dw family-patient is now comfort care and was DNR on admission.   Nutrition Problem: Severe Malnutrition Etiology: chronic illness (Alzheimer's dementia, recurrent SBOs)  Signs/Symptoms: percent weight loss,severe fat depletion,severe muscle depletion    Interventions: Refer to RD note for recommendations  Estimated body mass index is 19.35 kg/m as calculated from the  following:   Height as of this encounter: 5\' 2"  (1.575 m).   Weight as of this encounter: 48 kg.  DVT prophylaxis: none comfort care Code Status: DNR Family Communication: None at bedside  disposition Plan:  Status is: Inpatient  Dispo: The patient is from: Home              Anticipated d/c is to: residential hospice              Anticipated d/c date is: > 3 days              Patient currently is not medically stable to d/c.   Consultants: Surgery  Procedures: NG tube Antimicrobials: None  Subjective: She is resting comfortably. NG tube still in place draining. She does not appear to be in any distress. Objective: Vitals:   09/01/20 0522 09/01/20 1334 09/01/20 1400 09/02/20 0407  BP: (!) 195/69 (!) 175/64 (!) 145/56 (!) 179/72  Pulse: 60 60 62 72  Resp: 14 16  16   Temp: 97.8 F (36.6 C) 98.2 F (36.8 C)  98 F (36.7 C)  TempSrc: Oral Oral  Oral  SpO2: 98% 97%  93%  Weight:      Height:        Intake/Output Summary (Last 24 hours) at 09/03/2020 1409 Last data filed at 09/03/2020 0548 Gross per 24 hour  Intake -  Output 750 ml  Net -750 ml   Filed Weights   08/27/20 1610 08/27/20 2351  Weight: 51.7 kg 48 kg    Examination:  General exam: Appears calm and comfortable  Respiratory system: Clear to auscultation. Respiratory effort normal. Cardiovascular system: S1 & S2  heard, RRR. No JVD, murmurs, rubs, gallops or clicks. No pedal edema. Gastrointestinal system: Abdomen is nondistended, soft and nontender. No organomegaly or masses felt.  High-pitched bowel sounds heard. Central nervous system: awake confused  Extremities:no edema Skin: No rashes, lesions or ulcers Psychiatry: Judgement and insight impaired   Data Reviewed: I have personally reviewed following labs and imaging studies  CBC: Recent Labs  Lab 08/27/20 1803 08/28/20 0635 08/29/20 0814 08/30/20 0558  WBC 12.5* 8.5 7.8 8.5  NEUTROABS 11.2* 6.9  --   --   HGB 10.5* 8.3* 8.9* 8.8*  HCT 34.2*  27.1* 28.8* 28.8*  MCV 86.4 87.1 87.8 86.0  PLT 133* 132* 132* 135*   Basic Metabolic Panel: Recent Labs  Lab 08/27/20 1803 08/28/20 0635 08/29/20 0814 08/30/20 0558 08/31/20 0558 09/01/20 0906  NA 144 144 147* 144 144 136  K 4.2 3.5 3.7 3.4* 3.4* 3.7  CL 102 107 103 99 99 93*  CO2 29 25 32 29 30 30   GLUCOSE 191* 129* 99 72 77 172*  BUN 33* 29* 21 22 18 16   CREATININE 1.27* 1.19* 1.09* 0.99 1.03* 0.95  CALCIUM 9.3 8.1* 8.8* 8.7* 8.6* 8.3*  MG 1.8 1.7  --  2.0 1.7  --   PHOS 4.1 3.2  --   --   --   --    GFR: Estimated Creatinine Clearance: 28.6 mL/min (by C-G formula based on SCr of 0.95 mg/dL). Liver Function Tests: Recent Labs  Lab 08/27/20 1803 08/28/20 0635 08/31/20 0558  AST 17 12* 19  ALT 8 7 9   ALKPHOS 64 45 46  BILITOT 0.7 0.5 0.8  PROT 7.0 5.3* 5.6*  ALBUMIN 3.6 2.9* 3.0*   No results for input(s): LIPASE, AMYLASE in the last 168 hours. No results for input(s): AMMONIA in the last 168 hours. Coagulation Profile: No results for input(s): INR, PROTIME in the last 168 hours. Cardiac Enzymes: Recent Labs  Lab 08/27/20 1803  CKTOTAL 48   BNP (last 3 results) No results for input(s): PROBNP in the last 8760 hours. HbA1C: No results for input(s): HGBA1C in the last 72 hours. CBG: Recent Labs  Lab 08/31/20 2005 08/31/20 2356 09/01/20 0354 09/01/20 0748 09/01/20 1331  GLUCAP 139* 139* 136* 150* 199*   Lipid Profile: No results for input(s): CHOL, HDL, LDLCALC, TRIG, CHOLHDL, LDLDIRECT in the last 72 hours. Thyroid Function Tests: No results for input(s): TSH, T4TOTAL, FREET4, T3FREE, THYROIDAB in the last 72 hours. Anemia Panel: No results for input(s): VITAMINB12, FOLATE, FERRITIN, TIBC, IRON, RETICCTPCT in the last 72 hours. Sepsis Labs: Recent Labs  Lab 08/27/20 1803 08/27/20 1955 08/28/20 0031 08/28/20 0635  PROCALCITON <0.10  --   --   --   LATICACIDVEN 2.4* 2.2* 1.7 1.0    Recent Results (from the past 240 hour(s))  SARS  Coronavirus 2 by RT PCR (hospital order, performed in [facility] hospital lab) Nasopharyngeal Nasopharyngeal Swab     Status: None   Collection Time: 08/27/20  4:21 PM   Specimen: Nasopharyngeal Swab  Result Value Ref Range Status   SARS Coronavirus 2 NEGATIVE NEGATIVE Final    Comment: (NOTE) SARS-CoV-2 target nucleic acids are NOT DETECTED.  The SARS-CoV-2 RNA is generally detectable in upper and lower respiratory specimens during the acute phase of infection. The lowest concentration of SARS-CoV-2 viral copies this assay can detect is 250 copies / mL. A negative result does not preclude SARS-CoV-2 infection and should not be used as the sole basis for treatment or  other patient management decisions.  A negative result may occur with improper specimen collection / handling, submission of specimen other than nasopharyngeal swab, presence of viral mutation(s) within the areas targeted by this assay, and inadequate number of viral copies (<250 copies / mL). A negative result must be combined with clinical observations, patient history, and epidemiological information.  Fact Sheet for Patients:   BoilerBrush.com.cy  Fact Sheet for Healthcare Providers: https://pope.com/  This test is not yet approved or  cleared by the [place] FDA and has been authorized for detection and/or diagnosis of SARS-CoV-2 by FDA under an Emergency Use Authorization (EUA).  This EUA will remain in effect (meaning this test can be used) for the duration of the COVID-19 declaration under Section 564(b)(1) of the Act, 21 U.S.C. section 360bbb-3(b)(1), unless the authorization is terminated or revoked sooner.  Performed at [facility], [address], [place], [place] [address]   Blood culture (routine x 2)     Status: None   Collection Time: 08/27/20  6:03 PM   Specimen: BLOOD  Result Value Ref Range Status   Specimen Description    Final    BLOOD RIGHT ARM Performed at [facility], [address] [place], [place] [address]    Special Requests   Final    BOTTLES DRAWN AEROBIC AND ANAEROBIC Blood Culture adequate volume Performed at [facility], [address] [place], [place] [address]    Culture   Final    NO GROWTH 5 DAYS Performed at [facility], [address] [place], [place] [address]    Report Status 09/01/2020 FINAL  Final  Blood culture (routine x 2)     Status: None   Collection Time: 08/27/20  7:55 PM   Specimen: BLOOD  Result Value Ref Range Status   Specimen Description   Final    BLOOD RIGHT ARM Performed at [facility], [address], [place], [place] [address]    Special Requests   Final    BOTTLES DRAWN AEROBIC AND ANAEROBIC Blood Culture adequate volume Performed at [facility], [address] [place], [place] [address]    Culture   Final    NO GROWTH 5 DAYS Performed at [facility], [address], [place], [place] [address]    Report Status 09/01/2020 FINAL  Final  Urine culture     Status: Abnormal   Collection Time: 08/27/20 11:53 PM   Specimen: Urine, Random  Result Value Ref Range Status   Specimen Description   Final    URINE, RANDOM Performed at [facility], [address] [place], [place] [address]    Special Requests   Final    NONE Performed at [facility], [address], [place], [place] [address]    Culture MULTIPLE SPECIES PRESENT, SUGGEST RECOLLECTION (A)  Final   Report Status 08/29/2020 FINAL  Final         Radiology Studies: No results found.      Scheduled Meds: .  morphine injection  1 mg Intravenous Q6H  . ondansetron (ZOFRAN) IV  4 mg Intravenous Q8H   Continuous Infusions:    LOS: 7 days    Alwyn Ren, MD 09/03/2020, 2:09 PM

## 2020-09-04 DIAGNOSIS — K56609 Unspecified intestinal obstruction, unspecified as to partial versus complete obstruction: Secondary | ICD-10-CM | POA: Diagnosis not present

## 2020-09-04 DIAGNOSIS — R6520 Severe sepsis without septic shock: Secondary | ICD-10-CM | POA: Diagnosis not present

## 2020-09-04 DIAGNOSIS — F0281 Dementia in other diseases classified elsewhere with behavioral disturbance: Secondary | ICD-10-CM | POA: Diagnosis not present

## 2020-09-04 DIAGNOSIS — J9601 Acute respiratory failure with hypoxia: Secondary | ICD-10-CM | POA: Diagnosis not present

## 2020-09-04 DIAGNOSIS — A419 Sepsis, unspecified organism: Secondary | ICD-10-CM | POA: Diagnosis not present

## 2020-09-04 DIAGNOSIS — G309 Alzheimer's disease, unspecified: Secondary | ICD-10-CM | POA: Diagnosis not present

## 2020-09-04 DIAGNOSIS — Z515 Encounter for palliative care: Secondary | ICD-10-CM | POA: Diagnosis not present

## 2020-09-04 NOTE — Progress Notes (Addendum)
Civil engineer, contracting [facility]) Hospital Liaison note.     Received request from [facility] manager for family interest in [facility]. [facility] is unable to offer a room today. Hospital Liaison will follow up tomorrow or sooner if a room becomes available. Eligibility is confirmed as long as SBO is resolved per Dr. Kern Reap with [facility].    A Please do not hesitate to call with questions.     Thank you,     Elsie Saas, RN, [facility]       [facility] Liaison (listed on [facility]osp[facility])     [phone]

## 2020-09-04 NOTE — Progress Notes (Signed)
Pt pulled out her NG tube , per md order ok to keep out. Pt awake but confused. Will continue to monitor.

## 2020-09-04 NOTE — Progress Notes (Signed)
Daily Progress Note   Patient Name: Sonya Chavez       Date: 09/04/2020 DOB: [birth year]  Age: 87 y.o. MRN#: [id number] Attending Physician: Alwyn Ren, MD Primary Care Physician: Alysia Penna, MD Admit Date: 08/27/2020  Reason for Consultation/Follow-up: Establishing goals of care and Terminal Care  Subjective: Sonya Chavez wakes to voice. She is confused with baseline dementia. She does not appear to be in any pain or discomfort during visit.   She removed NGT and RN instructed to leave out. Symptoms controlled with scheduled morphine and zofran.   No family at bedside.   Discussed with Dr. Ashley Royalty and RN.    Length of Stay: 8  Current Medications: Scheduled Meds:  .  morphine injection  1 mg Intravenous Q6H  . ondansetron (ZOFRAN) IV  4 mg Intravenous Q8H    Continuous Infusions:   PRN Meds: acetaminophen **OR** acetaminophen, diclofenac Sodium, glycopyrrolate, haloperidol lactate, hydrALAZINE, labetalol, lip balm, LORazepam, morphine injection, promethazine  Physical Exam Vitals and nursing note reviewed.  Constitutional:      Appearance: She is cachectic. She is ill-appearing.  Pulmonary:     Effort: No tachypnea, accessory muscle usage or respiratory distress.  Skin:    General: Skin is warm and dry.  Neurological:     Mental Status: She is easily aroused.     Comments: Awake, disoriented with baseline dementia  Psychiatric:        Attention and Perception: She is inattentive.        Speech: Speech is delayed.        Cognition and Memory: Cognition is impaired.            Vital Signs: BP (!) 179/72   Pulse 72   Temp 98 F (36.7 C) (Oral)   Resp 16   Ht 5\' 2"  (1.575 m)   Wt 48 kg   SpO2 93%   BMI 19.35 kg/m  SpO2: SpO2: 93 % O2  Device: O2 Device: Room Air O2 Flow Rate: O2 Flow Rate (L/min): 2 L/min  Intake/output summary:   Intake/Output Summary (Last 24 hours) at 09/04/2020 1505 Last data filed at 09/04/2020 0558 Gross per 24 hour  Intake -  Output 750 ml  Net -750 ml   LBM: Last BM Date: 08/30/20 Baseline Weight: Weight: 51.7 kg Most recent weight:  Weight: 48 kg       Palliative Assessment/Data: PPS 20%      Patient Active Problem List   Diagnosis Date Noted  . Sepsis (HCC) 08/27/2020  . Cholelithiasis with acute cholecystitis 07/16/2020  . S/P right hip fracture 05/14/2020  . DNR (do not resuscitate) 10/07/2019  . Alzheimer's dementia with behavioral disturbance (HCC)   . Palliative care encounter   . Failure to thrive (0-17) 10/06/2019  . Weakness generalized 10/05/2019  . Hypernatremia 07/18/2019  . Hypokalemia 07/18/2019  . Elevated troponin 07/17/2019  . Acute lower UTI 07/17/2019  . Positive D dimer 07/17/2019  . Normocytic anemia 07/17/2019  . Cholelithiasis 01/02/2019  . CKD (chronic kidney disease), stage III (HCC) 10/21/2017  . Delirium due to another medical condition 12/18/2016  . Small bowel obstruction (HCC) 12/18/2016  . 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 Leukocytosis 12/18/2016  . Peripheral artery disease (HCC) 11/08/2014  . PAD (peripheral artery disease) (HCC) 10/24/2014  . LBBB (left bundle branch block) 09/27/2014  . Subclavian artery stenosis, left (HCC) 09/27/2014  . Numbness and tingling in left hand 03/08/2013  . HTN (hypertension) 03/08/2013  . DM (diabetes mellitus) (HCC) 03/08/2013  . CAD (coronary artery disease) 03/08/2013    Palliative Care Assessment & Plan   Patient Profile: 87 y.o. female  with past medical history of dementia, hospitalization for small bowel obstruction Dec 2021, CKD stage IIIb, HLD, DM type 2, CAD, HTN admitted on 08/27/2020 with abdominal discomfort, nausea, vomiting. In ED, CT scan revealed high-grade small bowel obstruction,  distended gallbladder with large gallstone patchy infiltrates at bases, cannot rule out achalasia. General surgery following. NGT placed. Patient is not tolerating clamping trials. Poor surgical candidate due to age, frailty, dementia. Palliative medicine consultation for goals of care.   Assessment: Small bowel obstruction Acute hypoxic respiratory failure CAP Dementia Abdominal pain Adult failure to thrive  Recommendations/Plan:  DNR/DNI  Continue comfort measures only.  NGT pulled out. Will not replace as this will be uncomfortable to patient. Continue scheduled morphine and zofran.   Comfort feeds. Bites and sips per patient's tolerance. Aspiration precautions.   Unrestricted visitor access.  TOC team referral for residential hospice placement. Stable for transfer today if bed available.   PMT will continue to follow for symptom needs and family support.   Goals of Care and Additional Recommendations:  Limitations on Scope of Treatment: Full Comfort Care  Code Status:    Code Status Orders  (From admission, onward)         Start     Ordered   08/27/20 2257  Do not attempt resuscitation (DNR)  Continuous       Question Answer Comment  In the event of cardiac or respiratory ARREST Do not call a "code blue"   In the event of cardiac or respiratory ARREST Do not perform Intubation, CPR, defibrillation or ACLS   In the event of cardiac or respiratory ARREST Use medication by any route, position, wound care, and other measures to relive pain and suffering. May use oxygen, suction and manual treatment of airway obstruction as needed for comfort.      08/27/20 2256        Code Status History    Date Active Date Inactive Code Status Order ID Comments User Context   07/17/2020 1124 07/20/2020 2001 DNR [id number]  Dimple Nanas, MD Inpatient   07/16/2020 1121 07/17/2020 1124 Full Code [id number]  Clydie Braun, MD ED   05/13/2020 1503 05/16/2020 2138 Full Code  [id number]  Teddy Spike, DO Inpatient   10/06/2019 1340 10/12/2019 0120 DNR [id number]  Hughie Closs, MD Inpatient   10/05/2019 2203 10/06/2019 1340 Full Code [id number]  Rometta Emery, MD ED   10/05/2019 2040 10/05/2019 2202 DNR [id number]  Rometta Emery, MD ED   07/17/2019 0635 07/20/2019 1618 Full Code [id number]  Pearson Grippe, MD ED   12/29/2018 1854 01/05/2019 1723 Full Code [id number]  Pearson Grippe, MD ED   10/21/2017 0034 10/24/2017 1735 Full Code [id number]  Hillary Bow, DO ED   12/18/2016 0259 12/25/2016 2035 Full Code [id number]  Hillary Bow, DO ED   11/09/2014 1906 11/10/2014 1536 Full Code [id number]  Yates Decamp, MD Inpatient   11/08/2014 1617 11/09/2014 1906 Full Code [id number]  Yates Decamp, MD Inpatient   10/25/2014 1228 10/26/2014 1305 Full Code [id number]  Yates Decamp, MD Inpatient   10/24/2014 1207 10/25/2014 1228 Full Code [id number]  Yates Decamp, MD Inpatient   09/27/2014 0955 09/27/2014 1756 Full Code [id number]  Yates Decamp, MD Inpatient   03/08/2013 1825 03/09/2013 2243 Full Code [id number]  Saralyn Pilar, DO Inpatient   Advance Care Planning Activity       Prognosis:   < 2 weeks  Discharge Planning:  Hospice facility  Care plan was discussed with Dr Ashley Royalty, RN, no family at bedside  Thank you for allowing the Palliative Medicine Team to assist in the care of this patient.   Total Time 20 Prolonged Time Billed No    Greater than 50% of this time was spent counseling and coordinating care related to the above assessment and plan.   Vennie Homans, DNP, FNP-C Palliative Medicine Team  Phone: [phone] Fax: [phone]  Please contact Palliative Medicine Team phone at [phone] for questions and concerns.

## 2020-09-04 NOTE — Progress Notes (Signed)
Daily Progress Note   Patient Name: Sonya Chavez       Date: 09/04/2020 DOB: [birth year]  Age: 87 y.o. MRN#: [id number] Attending Physician: Georgette Shell, MD Primary Care Physician: Velna Hatchet, MD Admit Date: 08/27/2020  Reason for Consultation/Follow-up: Establishing goals of care  Subjective: I saw and examined Ms. Volcy today.  Her eyes were open and she is somewhat tracked me in the room but did not really speak.  Her husband is at the bedside.  He reports that since he has been here, he noticed some pain in her legs as demonstrated by her restlessly kicking her legs.  He reports this improves with pain medication.  States this is the same thing she does at home whenever her legs are uncomfortable for her.  Other than this, he says that she has been comfortable and he is very appreciative the care she has received.  We talked about her illness and him working through the process of beginning grieving.  They have been together for over 24 years and he states that he knew they would not be together forever, however, he has trouble thinking about what the future will be like.  He notes that he has lost other family members, but "that pain is not the same as this."  Provided anticipatory guidance on next steps moving forward and discussed nonverbal signs of uncontrolled symptoms so he knows when to notify staff if he sees any of them.  Length of Stay: 8  Current Medications: Scheduled Meds:  .  morphine injection  1 mg Intravenous Q6H  . ondansetron (ZOFRAN) IV  4 mg Intravenous Q8H    Continuous Infusions:   PRN Meds: acetaminophen **OR** acetaminophen, diclofenac Sodium, glycopyrrolate, haloperidol lactate, hydrALAZINE, labetalol, lip balm, LORazepam,  morphine injection, promethazine  Physical Exam         General: Alert, does not verbalize, in no acute distress.  HEENT: No bruits, no goiter, no JVD, NGT in place Heart: Tachycardic. No murmur appreciated. Lungs: Good air movement, clear Abdomen: tender, nondistended, positive bowel sounds.  Ext: No significant edema Skin: Warm and dry    Vital Signs: BP (!) 179/72   Pulse 72   Temp 98 F (36.7 C) (Oral)   Resp 16   Ht _0  (1.575 m)   Wt 48 kg  SpO2 93%   BMI 19.35 kg/m  SpO2: SpO2: 93 % O2 Device: O2 Device: Room Air O2 Flow Rate: O2 Flow Rate (L/min): 2 L/min  Intake/output summary:   Intake/Output Summary (Last 24 hours) at 09/04/2020 1020 Last data filed at 09/04/2020 0558 Gross per 24 hour  Intake --  Output 750 ml  Net -750 ml   LBM: Last BM Date: 08/30/20 Baseline Weight: Weight: 51.7 kg Most recent weight: Weight: 48 kg       Palliative Assessment/Data:      Patient Active Problem List   Diagnosis Date Noted  . Sepsis ([place]) 08/27/2020  . Cholelithiasis with acute cholecystitis 07/16/2020  . S/P right hip fracture 05/14/2020  . DNR (do not resuscitate) 10/07/2019  . Alzheimer's dementia with behavioral disturbance ([place])   . Palliative care encounter   . Failure to thrive (0-17) 10/06/2019  . Weakness generalized 10/05/2019  . Hypernatremia 07/18/2019  . Hypokalemia 07/18/2019  . Elevated troponin 07/17/2019  . Acute lower UTI 07/17/2019  . Positive D dimer 07/17/2019  . Normocytic anemia 07/17/2019  . Cholelithiasis 01/02/2019  . CKD (chronic kidney disease), stage III ([place]) 10/21/2017  . Delirium due to another medical condition 12/18/2016  . SBO (small bowel obstruction) ([place]) 12/18/2016  . 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 Leukocytosis 12/18/2016  . Peripheral artery disease ([place]) 11/08/2014  . PAD (peripheral artery disease) ([place]) 10/24/2014  . LBBB (left bundle branch block) 09/27/2014  . Subclavian artery stenosis, left ([place])  09/27/2014  . Numbness and tingling in left hand 03/08/2013  . HTN (hypertension) 03/08/2013  . DM (diabetes mellitus) ([place]) 03/08/2013  . CAD (coronary artery disease) 03/08/2013    Palliative Care Assessment & Plan   Patient Profile: 87 y.o. female  with past medical history of dementia, hospitalization for small bowel obstruction Dec 2021, CKD stage IIIb, HLD, DM type 2, CAD, HTN admitted on 08/27/2020 with abdominal discomfort, nausea, vomiting. In ED, CT scan revealed high-grade small bowel obstruction, distended gallbladder with large gallstone patchy infiltrates at bases, cannot rule out achalasia. General surgery following. NGT placed. Patient is not tolerating clamping trials. Poor surgical candidate due to age, frailty, dementia. Palliative medicine consultation for goals of care.   Recommendations/Plan:  - Full comfort care.  She appears to be symptomatically well controlled on my exam today.  Continue current regimen. - Continue to monitor with plan for transition to residential hospice if she remains stable to do so when bed is available. - Continue NG tube to low intermittent suction for symptom management.  If she does dislodges, however, I would not replace. - I met today with her husband and we discussed her disease, symptom management, and anticipatory guidance on what to expect moving forward. - Palliative will continue to follow peripherally.  Please call if there are specific needs or uncontrolled symptoms with which he can be of assistance.  Goals of Care and Additional Recommendations:  Limitations on Scope of Treatment: Full Comfort Care  Code Status:    Code Status Orders  (From admission, onward)         Start     Ordered   08/27/20 2257  Do not attempt resuscitation (DNR)  Continuous       Question Answer Comment  In the event of cardiac or respiratory ARREST Do not call a "code blue"   In the event of cardiac or respiratory ARREST Do not perform  Intubation, CPR, defibrillation or ACLS   In the event of cardiac or respiratory  ARREST Use medication by any route, position, wound care, and other measures to relive pain and suffering. May use oxygen, suction and manual treatment of airway obstruction as needed for comfort.      08/27/20 2256        Code Status History    Date Active Date Inactive Code Status Order ID Comments User Context   07/17/2020 1124 07/20/2020 2001 DNR [id number]  Damita Lack, MD Inpatient   07/16/2020 1121 07/17/2020 1124 Full Code [id number]  Norval Morton, MD ED   05/13/2020 1503 05/16/2020 2138 Full Code [id number]  Jonnie Finner, DO Inpatient   10/06/2019 1340 10/12/2019 0120 DNR [id number]  Darliss Cheney, MD Inpatient   10/05/2019 2203 10/06/2019 1340 Full Code [id number]  Elwyn Reach, MD ED   10/05/2019 2040 10/05/2019 2202 DNR [id number]  Elwyn Reach, MD ED   07/17/2019 0635 07/20/2019 1618 Full Code [id number]  Jani Gravel, MD ED   12/29/2018 1854 01/05/2019 1723 Full Code [id number]  Jani Gravel, MD ED   10/21/2017 0034 10/24/2017 1735 Full Code [id number]  Etta Quill, DO ED   12/18/2016 0259 12/25/2016 2035 Full Code [id number]  Etta Quill, DO ED   11/09/2014 1906 11/10/2014 1536 Full Code [id number]  Adrian Prows, MD Inpatient   11/08/2014 1617 11/09/2014 1906 Full Code [id number]  Adrian Prows, MD Inpatient   10/25/2014 1228 10/26/2014 1305 Full Code [id number]  Adrian Prows, MD Inpatient   10/24/2014 1207 10/25/2014 1228 Full Code [id number]  Adrian Prows, MD Inpatient   09/27/2014 0955 09/27/2014 1756 Full Code [id number]  Adrian Prows, MD Inpatient   03/08/2013 1825 03/09/2013 2243 Full Code [id number]  Nobie Putnam, DO Inpatient   Advance Care Planning Activity       Prognosis:   < 2 weeks  Discharge Planning:  Hospice facility  Care plan was discussed with bedside staff, Dr. Zigmund Daniel  Thank you for allowing the Palliative Medicine Team to assist in the care of this patient.   Time In: 0900 Time Out:  0930 Total Time 30 Prolonged Time Billed No   Greater than 50%  of this time was spent counseling and coordinating care related to the above assessment and plan.  Micheline Rough, MD  Please contact Palliative Medicine Team phone at [phone] for questions and concerns.

## 2020-09-04 NOTE — TOC Progression Note (Signed)
Transition of Care [facility]) - Progression Note    Patient Details  Name: Sonya Chavez MRN: [id number] Date of Birth: 16-Jun-1926  Transition of Care [facility]) CM/SW Contact  Correna Meacham, Meriam Sprague, RN Phone Number: 09/04/2020, 2:36 PM  Clinical Narrative:     [facility] consult for Residential Hospice. Pt already active with [facility] in the community. [facility] liaison contacted for referral to residential hospice.      Social Determinants of Health (SDOH) Interventions    Readmission Risk Interventions Readmission Risk Prevention Plan 07/19/2020  Transportation Screening Complete  PCP or Specialist Appt within 5-7 Days Complete  Home Care Screening Complete  Medication Review (RN CM) Complete  Some recent data might be hidden

## 2020-09-04 NOTE — Progress Notes (Signed)
PROGRESS NOTE    [place] [name]  DDU:[id number] DOB: 1925/10/12 DOA: 08/27/2020 PCP: Alysia Penna, MD   Brief Narrative-87 year old female with medical history of small bowel obstruction, CKD stage IIIb, dementia, hyperlipidemia, diabetes mellitus type 2, CAD, hypertension presented with abdominal discomfort, nausea vomiting.  She has prior history of SBO.  In the ED CT scan showed high-grade small bowel obstruction, distended gallbladder with large gallstone patchy infiltrates at bases.  Cannot rule out achalasia.  General surgery was consulted, NG tube was inserted.   Assessment & Plan:   Active Problems:   HTN (hypertension)   DM (diabetes mellitus) (HCC)   CAD (coronary artery disease)   PAD (peripheral artery disease) (HCC)   SBO (small bowel obstruction) (HCC)   CKD (chronic kidney disease), stage III (HCC)   Cholelithiasis   Normocytic anemia   Alzheimer's dementia with behavioral disturbance (HCC)   Sepsis (HCC)    #1 small bowel obstruction -appreciate palliative care input.  Patient is comfort care.  She is at too high risk to undergo any surgical procedure at this time.  Family is aware of it.  #2 acute hypoxic respiratory failure from community-acquired pneumonia present on admission was treated with Rocephin and Flagyl.    #3 history of chronic normocytic anemia stable.  #4 peripheral arterial disease stable  #5  Hypokalemia potassium 3.4 normal magnesium.  K being replaced by general surgery.  #6 history of essential hypertension   #7 type 2 diabetes  #8 Goals of care dw family-patient is now comfort care and was DNR on admission.    toc consulted for residential hospice. Nutrition Problem: Severe Malnutrition Etiology: chronic illness (Alzheimer's dementia, recurrent SBOs)  Signs/Symptoms: percent weight loss,severe fat depletion,severe muscle depletion    Interventions: Refer to RD note for recommendations  Estimated body mass index is  19.35 kg/m as calculated from the following:   Height as of this encounter: 5\' 2"  (1.575 m).   Weight as of this encounter: 48 kg.  DVT prophylaxis: none comfort care Code Status: DNR Family Communication: None at bedside  disposition Plan:  Status is: Inpatient  Dispo: The patient is from: Home              Anticipated d/c is to: residential hospice              Anticipated d/c date is:2 days              Patient currently is not medically stable to d/c.   Consultants: Surgery  Procedures: NG tube Antimicrobials: None  Subjective: Patient resting in bed does not appear to be in any distress appears comfortable NG tube was pulled out last night accidentally by the patient.  distress. Objective: Vitals:   09/01/20 0522 09/01/20 1334 09/01/20 1400 09/02/20 0407  BP: (!) 195/69 (!) 175/64 (!) 145/56 (!) 179/72  Pulse: 60 60 62 72  Resp: 14 16  16   Temp: 97.8 F (36.6 C) 98.2 F (36.8 C)  98 F (36.7 C)  TempSrc: Oral Oral  Oral  SpO2: 98% 97%  93%  Weight:      Height:        Intake/Output Summary (Last 24 hours) at 09/04/2020 1349 Last data filed at 09/04/2020 0558 Gross per 24 hour  Intake --  Output 750 ml  Net -750 ml   Filed Weights   08/27/20 1610 08/27/20 2351  Weight: 51.7 kg 48 kg    Examination:  General exam: Appears calm and comfortable  Respiratory  system: Clear to auscultation. Respiratory effort normal. Cardiovascular system: S1 & S2 heard, RRR. No JVD, murmurs, rubs, gallops or clicks. No pedal edema. Gastrointestinal system: Abdomen is nondistended, soft and nontender. No organomegaly or masses felt.  High-pitched bowel sounds heard. Central nervous system: awake confused  Extremities:no edema Skin: No rashes, lesions or ulcers Psychiatry: Judgement and insight impaired   Data Reviewed: I have personally reviewed following labs and imaging studies  CBC: Recent Labs  Lab 08/29/20 0814 08/30/20 0558  WBC 7.8 8.5  HGB 8.9* 8.8*  HCT  28.8* 28.8*  MCV 87.8 86.0  PLT 132* 135*   Basic Metabolic Panel: Recent Labs  Lab 08/29/20 0814 08/30/20 0558 08/31/20 0558 09/01/20 0906  NA 147* 144 144 136  K 3.7 3.4* 3.4* 3.7  CL 103 99 99 93*  CO2 32 29 30 30   GLUCOSE 99 72 77 172*  BUN 21 22 18 16   CREATININE 1.09* 0.99 1.03* 0.95  CALCIUM 8.8* 8.7* 8.6* 8.3*  MG  --  2.0 1.7  --    GFR: Estimated Creatinine Clearance: 28.6 mL/min (by C-G formula based on SCr of 0.95 mg/dL). Liver Function Tests: Recent Labs  Lab 08/31/20 0558  AST 19  ALT 9  ALKPHOS 46  BILITOT 0.8  PROT 5.6*  ALBUMIN 3.0*   No results for input(s): LIPASE, AMYLASE in the last 168 hours. No results for input(s): AMMONIA in the last 168 hours. Coagulation Profile: No results for input(s): INR, PROTIME in the last 168 hours. Cardiac Enzymes: No results for input(s): CKTOTAL, CKMB, CKMBINDEX, TROPONINI in the last 168 hours. BNP (last 3 results) No results for input(s): PROBNP in the last 8760 hours. HbA1C: No results for input(s): HGBA1C in the last 72 hours. CBG: Recent Labs  Lab 08/31/20 2005 08/31/20 2356 09/01/20 0354 09/01/20 0748 09/01/20 1331  GLUCAP 139* 139* 136* 150* 199*   Lipid Profile: No results for input(s): CHOL, HDL, LDLCALC, TRIG, CHOLHDL, LDLDIRECT in the last 72 hours. Thyroid Function Tests: No results for input(s): TSH, T4TOTAL, FREET4, T3FREE, THYROIDAB in the last 72 hours. Anemia Panel: No results for input(s): VITAMINB12, FOLATE, FERRITIN, TIBC, IRON, RETICCTPCT in the last 72 hours. Sepsis Labs: No results for input(s): PROCALCITON, LATICACIDVEN in the last 168 hours.  Recent Results (from the past 240 hour(s))  SARS Coronavirus 2 by RT PCR (hospital order, performed in [facility] hospital lab) Nasopharyngeal Nasopharyngeal Swab     Status: None   Collection Time: 08/27/20  4:21 PM   Specimen: Nasopharyngeal Swab  Result Value Ref Range Status   SARS Coronavirus 2 NEGATIVE NEGATIVE Final     Comment: (NOTE) SARS-CoV-2 target nucleic acids are NOT DETECTED.  The SARS-CoV-2 RNA is generally detectable in upper and lower respiratory specimens during the acute phase of infection. The lowest concentration of SARS-CoV-2 viral copies this assay can detect is 250 copies / mL. A negative result does not preclude SARS-CoV-2 infection and should not be used as the sole basis for treatment or other patient management decisions.  A negative result may occur with improper specimen collection / handling, submission of specimen other than nasopharyngeal swab, presence of viral mutation(s) within the areas targeted by this assay, and inadequate number of viral copies (<250 copies / mL). A negative result must be combined with clinical observations, patient history, and epidemiological information.  Fact Sheet for Patients:   [facility]  Fact Sheet for Healthcare Providers: 08/29/20  This test is not yet approved or  cleared by the  [employer] and has been authorized for detection and/or diagnosis of SARS-CoV-2 by FDA under an TEFL teacher (EUA).  This EUA will remain in effect (meaning this test can be used) for the duration of the COVID-19 declaration under Section 564(b)(1) of the Act, 21 U.S.C. section 360bbb-3(b)(1), unless the authorization is terminated or revoked sooner.  Performed at [facility], [address], [place], [place] [address]   Blood culture (routine x 2)     Status: None   Collection Time: 08/27/20  6:03 PM   Specimen: BLOOD  Result Value Ref Range Status   Specimen Description   Final    BLOOD RIGHT ARM Performed at [facility], [address] [place], [place] [address]    Special Requests   Final    BOTTLES DRAWN AEROBIC AND ANAEROBIC Blood Culture adequate volume Performed at [facility], [address]  [place], [place] [address]    Culture   Final    NO GROWTH 5 DAYS Performed at [facility], [address] [place], [place] [address]    Report Status 09/01/2020 FINAL  Final  Blood culture (routine x 2)     Status: None   Collection Time: 08/27/20  7:55 PM   Specimen: BLOOD  Result Value Ref Range Status   Specimen Description   Final    BLOOD RIGHT ARM Performed at [facility], [address] [place], [place] [address]    Special Requests   Final    BOTTLES DRAWN AEROBIC AND ANAEROBIC Blood Culture adequate volume Performed at [facility], [address], [place], [place] [address]    Culture   Final    NO GROWTH 5 DAYS Performed at [facility], [address], [place], [place] [address]    Report Status 09/01/2020 FINAL  Final  Urine culture     Status: Abnormal   Collection Time: 08/27/20 11:53 PM   Specimen: Urine, Random  Result Value Ref Range Status   Specimen Description   Final    URINE, RANDOM Performed at [facility], [address], [place], [place] [address]    Special Requests   Final    NONE Performed at [facility], [address], [place], [place] [address]    Culture MULTIPLE SPECIES PRESENT, SUGGEST RECOLLECTION (A)  Final   Report Status 08/29/2020 FINAL  Final         Radiology Studies: No results found.      Scheduled Meds: .  morphine injection  1 mg Intravenous Q6H  . ondansetron (ZOFRAN) IV  4 mg Intravenous Q8H   Continuous Infusions:    LOS: 8 days    Alwyn Ren, MD 09/04/2020, 1:49 PM

## 2020-09-05 DIAGNOSIS — A419 Sepsis, unspecified organism: Secondary | ICD-10-CM | POA: Diagnosis not present

## 2020-09-05 DIAGNOSIS — J9601 Acute respiratory failure with hypoxia: Secondary | ICD-10-CM | POA: Diagnosis not present

## 2020-09-05 DIAGNOSIS — K56609 Unspecified intestinal obstruction, unspecified as to partial versus complete obstruction: Secondary | ICD-10-CM | POA: Diagnosis not present

## 2020-09-05 DIAGNOSIS — R6520 Severe sepsis without septic shock: Secondary | ICD-10-CM | POA: Diagnosis not present

## 2020-09-05 NOTE — Progress Notes (Signed)
PROGRESS NOTE    [place] M Kruschke  NOI:[id number] DOB: 06-02-1926 DOA: 08/27/2020 PCP: Alysia Penna, MD   Brief Narrative-87 year old female with medical history of small bowel obstruction, CKD stage IIIb, dementia, hyperlipidemia, diabetes mellitus type 2, CAD, hypertension presented with abdominal discomfort, nausea vomiting.  She has prior history of SBO.  In the ED CT scan showed high-grade small bowel obstruction, distended gallbladder with large gallstone patchy infiltrates at bases.  Cannot rule out achalasia.  General surgery was consulted, NG tube was inserted.   Assessment & Plan:   Active Problems:   HTN (hypertension)   DM (diabetes mellitus) (HCC)   CAD (coronary artery disease)   PAD (peripheral artery disease) (HCC)   Small bowel obstruction (HCC)   CKD (chronic kidney disease), stage III (HCC)   Cholelithiasis   Normocytic anemia   Alzheimer's dementia with behavioral disturbance (HCC)   Sepsis (HCC)    #1 small bowel obstruction -appreciate palliative care input.  Patient is comfort care.  She is at too high risk to undergo any surgical procedure at this time.  Family is aware of it.  #2 acute hypoxic respiratory failure from community-acquired pneumonia present on admission was treated with Rocephin and Flagyl.    #3 history of chronic normocytic anemia stable.  #4 peripheral arterial disease stable  #5  Hypokalemia potassium 3.4 normal magnesium.  K being replaced by general surgery.  #6 history of essential hypertension   #7 type 2 diabetes  #8 Goals of care dw family-patient is now comfort care and was DNR on admission.    toc consulted for residential hospice. Nutrition Problem: Severe Malnutrition Etiology: chronic illness (Alzheimer's dementia, recurrent SBOs)  Signs/Symptoms: percent weight loss,severe fat depletion,severe muscle depletion    Interventions: Refer to RD note for recommendations  Estimated body mass index is 19.35  kg/m as calculated from the following:   Height as of this encounter: 5\' 2"  (1.575 m).   Weight as of this encounter: 48 kg.  DVT prophylaxis: none comfort care Code Status: DNR Family Communication: None at bedside  disposition Plan:  Status is: Inpatient  Dispo: The patient is from: Home              Anticipated d/c is to: residential hospice              Anticipated d/c date is:2 days              Patient currently is not medically stable to d/c.   Consultants: Surgery  Procedures: NG tube Antimicrobials: None  Subjective: Patient resting comfortably Objective: Vitals:   09/01/20 0522 09/01/20 1334 09/01/20 1400 09/02/20 0407  BP: (!) 195/69 (!) 175/64 (!) 145/56 (!) 179/72  Pulse: 60 60 62 72  Resp: 14 16  16   Temp: 97.8 F (36.6 C) 98.2 F (36.8 C)  98 F (36.7 C)  TempSrc: Oral Oral  Oral  SpO2: 98% 97%  93%  Weight:      Height:       No intake or output data in the 24 hours ending 09/05/20 1438 Filed Weights   08/27/20 1610 08/27/20 2351  Weight: 51.7 kg 48 kg    Examination:  General exam: Appears calm and comfortable  Respiratory system: Clear to auscultation. Respiratory effort normal. Cardiovascular system: S1 & S2 heard, RRR. No JVD, murmurs, rubs, gallops or clicks. No pedal edema. Gastrointestinal system: Abdomen is nondistended, soft and nontender. No organomegaly or masses felt.  High-pitched bowel sounds heard. Central nervous system:  awake confused  Extremities:no edema Skin: No rashes, lesions or ulcers Psychiatry: Judgement and insight impaired   Data Reviewed: I have personally reviewed following labs and imaging studies  CBC: Recent Labs  Lab 08/30/20 0558  WBC 8.5  HGB 8.8*  HCT 28.8*  MCV 86.0  PLT 135*   Basic Metabolic Panel: Recent Labs  Lab 08/30/20 0558 08/31/20 0558 09/01/20 0906  NA 144 144 136  K 3.4* 3.4* 3.7  CL 99 99 93*  CO2 29 30 30   GLUCOSE 72 77 172*  BUN 22 18 16   CREATININE 0.99 1.03* 0.95   CALCIUM 8.7* 8.6* 8.3*  MG 2.0 1.7  --    GFR: Estimated Creatinine Clearance: 28.6 mL/min (by C-G formula based on SCr of 0.95 mg/dL). Liver Function Tests: Recent Labs  Lab 08/31/20 0558  AST 19  ALT 9  ALKPHOS 46  BILITOT 0.8  PROT 5.6*  ALBUMIN 3.0*   No results for input(s): LIPASE, AMYLASE in the last 168 hours. No results for input(s): AMMONIA in the last 168 hours. Coagulation Profile: No results for input(s): INR, PROTIME in the last 168 hours. Cardiac Enzymes: No results for input(s): CKTOTAL, CKMB, CKMBINDEX, TROPONINI in the last 168 hours. BNP (last 3 results) No results for input(s): PROBNP in the last 8760 hours. HbA1C: No results for input(s): HGBA1C in the last 72 hours. CBG: Recent Labs  Lab 08/31/20 2005 08/31/20 2356 09/01/20 0354 09/01/20 0748 09/01/20 1331  GLUCAP 139* 139* 136* 150* 199*   Lipid Profile: No results for input(s): CHOL, HDL, LDLCALC, TRIG, CHOLHDL, LDLDIRECT in the last 72 hours. Thyroid Function Tests: No results for input(s): TSH, T4TOTAL, FREET4, T3FREE, THYROIDAB in the last 72 hours. Anemia Panel: No results for input(s): VITAMINB12, FOLATE, FERRITIN, TIBC, IRON, RETICCTPCT in the last 72 hours. Sepsis Labs: No results for input(s): PROCALCITON, LATICACIDVEN in the last 168 hours.  Recent Results (from the past 240 hour(s))  SARS Coronavirus 2 by RT PCR (hospital order, performed in [facility] hospital lab) Nasopharyngeal Nasopharyngeal Swab     Status: None   Collection Time: 08/27/20  4:21 PM   Specimen: Nasopharyngeal Swab  Result Value Ref Range Status   SARS Coronavirus 2 NEGATIVE NEGATIVE Final    Comment: (NOTE) SARS-CoV-2 target nucleic acids are NOT DETECTED.  The SARS-CoV-2 RNA is generally detectable in upper and lower respiratory specimens during the acute phase of infection. The lowest concentration of SARS-CoV-2 viral copies this assay can detect is 250 copies / mL. A negative result does not preclude  SARS-CoV-2 infection and should not be used as the sole basis for treatment or other patient management decisions.  A negative result may occur with improper specimen collection / handling, submission of specimen other than nasopharyngeal swab, presence of viral mutation(s) within the areas targeted by this assay, and inadequate number of viral copies (<250 copies / mL). A negative result must be combined with clinical observations, patient history, and epidemiological information.  Fact Sheet for Patients:   [facility]  Fact Sheet for Healthcare Providers: 08/29/20  This test is not yet approved or  cleared by the [employer] FDA and has been authorized for detection and/or diagnosis of SARS-CoV-2 by FDA under an Emergency Use Authorization (EUA).  This EUA will remain in effect (meaning this test can be used) for the duration of the COVID-19 declaration under Section 564(b)(1) of the Act, 21 U.S.C. section 360bbb-3(b)(1), unless the authorization is terminated or revoked sooner.  Performed at [facility], [address], [place], [place] [address]   Blood culture (routine x 2)     Status: None   Collection Time: 08/27/20  6:03 PM   Specimen: BLOOD  Result Value Ref Range Status   Specimen Description   Final    BLOOD RIGHT ARM Performed at [facility], [address], [place], [place] [address]    Special Requests   Final    BOTTLES DRAWN AEROBIC AND ANAEROBIC Blood Culture adequate volume Performed at [facility], [address], [place], [place] [address]    Culture   Final    NO GROWTH 5 DAYS Performed at [facility], [address], [place], [place] [address]    Report Status 09/01/2020 FINAL  Final  Blood culture (routine x 2)     Status: None   Collection Time: 08/27/20  7:55 PM   Specimen: BLOOD  Result Value Ref Range Status    Specimen Description   Final    BLOOD RIGHT ARM Performed at [facility], [address], [place], [place] [address]    Special Requests   Final    BOTTLES DRAWN AEROBIC AND ANAEROBIC Blood Culture adequate volume Performed at [facility], [address], [place], [place] [address]    Culture   Final    NO GROWTH 5 DAYS Performed at [facility], [address] [place], [place] [address]    Report Status 09/01/2020 FINAL  Final  Urine culture     Status: Abnormal   Collection Time: 08/27/20 11:53 PM   Specimen: Urine, Random  Result Value Ref Range Status   Specimen Description   Final    URINE, RANDOM Performed at [facility], [address], [place], [place] [address]    Special Requests   Final    NONE Performed at [facility], [address] [place], [place] [address]    Culture MULTIPLE SPECIES PRESENT, SUGGEST RECOLLECTION (A)  Final   Report Status 08/29/2020 FINAL  Final         Radiology Studies: No results found.      Scheduled Meds: .  morphine injection  1 mg Intravenous Q6H  . ondansetron (ZOFRAN) IV  4 mg Intravenous Q8H   Continuous Infusions:    LOS: 9 days    Alwyn Ren, MD 09/05/2020, 2:38 PM

## 2020-09-05 NOTE — Progress Notes (Signed)
[facility] ([facility]) Hospital Liaison note.    Chart and pt information have been reviewed by [facility] physician.  Hospice eligibility confirmed.  [facility] is unable to offer a room today. Hospital Liaison will follow up tomorrow or sooner if a room becomes available. Please do not hesitate to call with questions.    Thank you for the opportunity to participate in this patient's care.  Chrislyn King, BSN, RN [facility] Hospital Liaison (listed on AMION under Hospice/[facility])    [phone] (24h on call)  

## 2020-09-05 NOTE — Progress Notes (Signed)
Nutrition Brief Note  Chart reviewed for follow-up. Pt now transitioning to comfort care.  No further nutrition interventions warranted at this time.   Tilda Franco, MS, RD, LDN Inpatient Clinical Dietitian Contact information available via Amion

## 2020-09-06 DIAGNOSIS — K56609 Unspecified intestinal obstruction, unspecified as to partial versus complete obstruction: Secondary | ICD-10-CM | POA: Diagnosis not present

## 2020-09-06 NOTE — Progress Notes (Signed)
[employer] [facility], [facility]) Hospital Liaison note.     This patient can transfer to [facility] today.  [facility] will notify TOC when registration paperwork has been completed to arrange transport.   RN please call report to [phone].   Thank you,     Elsie Saas, RN, [facility]       [facility] Liaison (listed on AMION under Hospice/[facility])     [phone]

## 2020-09-06 NOTE — Discharge Summary (Signed)
Physician Discharge Summary   [place] M Cajamarca ZOX:[id number] DOB: 1926-06-21 DOA: 08/27/2020  PCP: Alysia Penna, MD  Admit date: 08/27/2020 Discharge date: 09/06/2020  Admitted From: home Disposition:  [facility] Discharging physician: Lewie Chamber, MD  Recommendations for Outpatient Follow-up:  1. Continue comfort care  Patient discharged to [facility] in Discharge Condition: poor Risk of unplanned readmission score: Unplanned Admission- Pilot do not use: 22.4  CODE STATUS: DNR Diet recommendation:  Diet Orders (From admission, onward)    Start     Ordered   09/04/20 1516  Diet clear liquid Room service appropriate? Yes; Fluid consistency: Thin  Diet effective now       Question Answer Comment  Room service appropriate? Yes   Fluid consistency: Thin      09/04/20 1515          Hospital Course: Sonya Chavez is a 87 year old female with medical history of small bowel obstruction, CKD stage IIIb, dementia, hyperlipidemia, diabetes mellitus type 2, CAD, hypertension presented with abdominal discomfort, nausea vomiting. She has prior history of SBO. In the ED CT scan showed high-grade small bowel obstruction, distended gallbladder with large gallstone patchy infiltrates at bases.  She was evaluated by surgery and considered too high risk for surgery. GOC discussions were held with husband and ultimately decision was made to pursue comfort care in setting of ongoing decline.   She did complete a course of rocephin/flagyl for presumed CAP with acute hypoxic respiratory failure as well.   small bowel obstruction -appreciate palliative care input.  Patient is comfort care.  She is at too high risk to undergo any surgical procedure at this time.  Plan is d/c to [facility]  acute hypoxic respiratory failure from community-acquired pneumonia present on admission was treated with Rocephin and Flagyl.    history of chronic normocytic anemia stable.  peripheral  arterial disease stable  Hypokalemia repleted as needed  history of essential hypertension - meds not continued at d/c  type 2 diabetes - patient now NPO; no further CBG monitoring   Goals of care dw family-patient is now comfort care and was DNR on admission.    toc consulted for residential hospice. Discharge to [facility] Nutrition Problem: Severe Malnutrition Etiology: chronic illness (Alzheimer's dementia, recurrent SBOs)  Signs/Symptoms: percent weight loss,severe fat depletion,severe muscle depletion   Principal Diagnosis: SBO (small bowel obstruction) (HCC)  Discharge Diagnoses: Active Hospital Problems   Diagnosis Date Noted  . SBO (small bowel obstruction) (HCC) 12/18/2016  . Alzheimer's dementia with behavioral disturbance (HCC)   . Normocytic anemia 07/17/2019  . Cholelithiasis 01/02/2019  . CKD (chronic kidney disease), stage III (HCC) 10/21/2017  . PAD (peripheral artery disease) (HCC) 10/24/2014  . HTN (hypertension) 03/08/2013  . DM (diabetes mellitus) (HCC) 03/08/2013  . CAD (coronary artery disease) 03/08/2013    Resolved Hospital Problems   Diagnosis Date Noted Date Resolved  . Sepsis (HCC) 08/27/2020 09/06/2020    Discharge Instructions    Increase activity slowly   Complete by: As directed      Allergies as of 09/06/2020      Reactions   Statins Other (See Comments)   Myalgias   Other    NO FOODS WITH SEEDS   Crestor [rosuvastatin] Other (See Comments)   Myalgias   Lipitor [atorvastatin] Other (See Comments)   myalgias   Zocor [simvastatin] Other (See Comments)   myalgias      Medication List    STOP taking these medications   acetaminophen 500 MG  tablet Commonly known as: TYLENOL   alendronate 70 MG tablet Commonly known as: FOSAMAX   ALPRAZolam 0.5 MG tablet Commonly known as: XANAX   amLODipine 10 MG tablet Commonly known as: NORVASC   aspirin EC 81 MG tablet   diclofenac Sodium 1 % Gel Commonly known as: VOLTAREN    divalproex 125 MG DR tablet Commonly known as: DEPAKOTE   gabapentin 300 MG capsule Commonly known as: NEURONTIN   hydrALAZINE 100 MG tablet Commonly known as: APRESOLINE   IRON PO   lovastatin 40 MG tablet Commonly known as: MEVACOR   metFORMIN 500 MG tablet Commonly known as: GLUCOPHAGE   sertraline 25 MG tablet Commonly known as: ZOLOFT   Vitamin D3 50 MCG (2000 UT) Tabs       Allergies  Allergen Reactions  . Statins Other (See Comments)    Myalgias   . Other     NO FOODS WITH SEEDS  . Crestor [Rosuvastatin] Other (See Comments)    Myalgias  . Lipitor [Atorvastatin] Other (See Comments)    myalgias  . Zocor [Simvastatin] Other (See Comments)    myalgias    Consults: Surgery  Discharge Exam: BP (!) 179/72   Pulse 72   Temp 98 F (36.7 C) (Oral)   Resp 16   Ht 5\' 2"  (1.575 m)   Wt 48 kg   SpO2 93%   BMI 19.35 kg/m  General appearance: ill appearing elderly woman laying in bed moaning and mildly uncomfortable Head: Normocephalic, without obvious abnormality, atraumatic Eyes: EOMI Lungs: mostly clear bilaterally Heart: regular rate and rhythm and S1, S2 normal Abdomen: thin, mild nonspecific TTP, BS hypoactive Extremities: no edema  MSK: soreness in LE with passive ROM Skin: mobility and turgor normal Neurologic: dementia noted; moves all 4 extremities spontaneously but not to commands  The results of significant diagnostics from this hospitalization (including imaging, microbiology, ancillary and laboratory) are listed below for reference.   Microbiology: Recent Results (from the past 240 hour(s))  SARS Coronavirus 2 by RT PCR (hospital order, performed in [facility] hospital lab) Nasopharyngeal Nasopharyngeal Swab     Status: None   Collection Time: 08/27/20  4:21 PM   Specimen: Nasopharyngeal Swab  Result Value Ref Range Status   SARS Coronavirus 2 NEGATIVE NEGATIVE Final    Comment: (NOTE) SARS-CoV-2 target nucleic acids are NOT  DETECTED.  The SARS-CoV-2 RNA is generally detectable in upper and lower respiratory specimens during the acute phase of infection. The lowest concentration of SARS-CoV-2 viral copies this assay can detect is 250 copies / mL. A negative result does not preclude SARS-CoV-2 infection and should not be used as the sole basis for treatment or other patient management decisions.  A negative result may occur with improper specimen collection / handling, submission of specimen other than nasopharyngeal swab, presence of viral mutation(s) within the areas targeted by this assay, and inadequate number of viral copies (<250 copies / mL). A negative result must be combined with clinical observations, patient history, and epidemiological information.  Fact Sheet for Patients:   08/29/20  Fact Sheet for Healthcare Providers: BoilerBrush.com.cy  This test is not yet approved or  cleared by the https://pope.com/ FDA and has been authorized for detection and/or diagnosis of SARS-CoV-2 by FDA under an Emergency Use Authorization (EUA).  This EUA will remain in effect (meaning this test can be used) for the duration of the COVID-19 declaration under Section 564(b)(1) of the Act, 21 U.S.C. section 360bbb-3(b)(1), unless the authorization is  terminated or revoked sooner.  Performed at [facility], [address] [place], [place] [address]   Blood culture (routine x 2)     Status: None   Collection Time: 08/27/20  6:03 PM   Specimen: BLOOD  Result Value Ref Range Status   Specimen Description   Final    BLOOD RIGHT ARM Performed at [facility], [address] [place], [place] [address]    Special Requests   Final    BOTTLES DRAWN AEROBIC AND ANAEROBIC Blood Culture adequate volume Performed at [facility], [address] [place], [place] [address]    Culture   Final    NO GROWTH 5  DAYS Performed at [facility], [address] [place], [place] [address]    Report Status 09/01/2020 FINAL  Final  Blood culture (routine x 2)     Status: None   Collection Time: 08/27/20  7:55 PM   Specimen: BLOOD  Result Value Ref Range Status   Specimen Description   Final    BLOOD RIGHT ARM Performed at [facility], [address] [place], [place] [address]    Special Requests   Final    BOTTLES DRAWN AEROBIC AND ANAEROBIC Blood Culture adequate volume Performed at [facility], [address] [place], [place] [address]    Culture   Final    NO GROWTH 5 DAYS Performed at [facility], [address] [place], [place] [address]    Report Status 09/01/2020 FINAL  Final  Urine culture     Status: Abnormal   Collection Time: 08/27/20 11:53 PM   Specimen: Urine, Random  Result Value Ref Range Status   Specimen Description   Final    URINE, RANDOM Performed at [facility], [address] [place], [place] [address]    Special Requests   Final    NONE Performed at [facility], [address] [place], [place] [address]    Culture MULTIPLE SPECIES PRESENT, SUGGEST RECOLLECTION (A)  Final   Report Status 08/29/2020 FINAL  Final     Labs: BNP (last 3 results) No results for input(s): BNP in the last 8760 hours. Basic Metabolic Panel: Recent Labs  Lab 08/31/20 0558 09/01/20 0906  NA 144 136  K 3.4* 3.7  CL 99 93*  CO2 30 30  GLUCOSE 77 172*  BUN 18 16  CREATININE 1.03* 0.95  CALCIUM 8.6* 8.3*  MG 1.7  --    Liver Function Tests: Recent Labs  Lab 08/31/20 0558  AST 19  ALT 9  ALKPHOS 46  BILITOT 0.8  PROT 5.6*  ALBUMIN 3.0*   No results for input(s): LIPASE, AMYLASE in the last 168 hours. No results for input(s): AMMONIA in the last 168 hours. CBC: No results for input(s): WBC, NEUTROABS, HGB, HCT, MCV, PLT in the last 168 hours. Cardiac Enzymes: No results for  input(s): CKTOTAL, CKMB, CKMBINDEX, TROPONINI in the last 168 hours. BNP: Invalid input(s): POCBNP CBG: Recent Labs  Lab 08/31/20 2005 08/31/20 2356 09/01/20 0354 09/01/20 0748 09/01/20 1331  GLUCAP 139* 139* 136* 150* 199*   D-Dimer No results for input(s): DDIMER in the last 72 hours. Hgb A1c No results for input(s): HGBA1C in the last 72 hours. Lipid Profile No results for input(s): CHOL, HDL, LDLCALC, TRIG, CHOLHDL, LDLDIRECT in the last 72 hours. Thyroid function studies No results for input(s): TSH, T4TOTAL, T3FREE, THYROIDAB in the  last 72 hours.  Invalid input(s): FREET3 Anemia work up No results for input(s): VITAMINB12, FOLATE, FERRITIN, TIBC, IRON, RETICCTPCT in the last 72 hours. Urinalysis    Component Value Date/Time   COLORURINE YELLOW 08/27/2020 2353   APPEARANCEUR CLEAR 08/27/2020 2353   LABSPEC 1.026 08/27/2020 2353   PHURINE 7.0 08/27/2020 2353   GLUCOSEU 50 (A) 08/27/2020 2353   HGBUR NEGATIVE 08/27/2020 2353   BILIRUBINUR NEGATIVE 08/27/2020 2353   KETONESUR NEGATIVE 08/27/2020 2353   PROTEINUR 100 (A) 08/27/2020 2353   UROBILINOGEN 0.2 06/03/2013 1938   NITRITE NEGATIVE 08/27/2020 2353   LEUKOCYTESUR NEGATIVE 08/27/2020 2353   Sepsis Labs Invalid input(s): PROCALCITONIN,  WBC,  LACTICIDVEN Microbiology Recent Results (from the past 240 hour(s))  SARS Coronavirus 2 by RT PCR (hospital order, performed in [facility] hospital lab) Nasopharyngeal Nasopharyngeal Swab     Status: None   Collection Time: 08/27/20  4:21 PM   Specimen: Nasopharyngeal Swab  Result Value Ref Range Status   SARS Coronavirus 2 NEGATIVE NEGATIVE Final    Comment: (NOTE) SARS-CoV-2 target nucleic acids are NOT DETECTED.  The SARS-CoV-2 RNA is generally detectable in upper and lower respiratory specimens during the acute phase of infection. The lowest concentration of SARS-CoV-2 viral copies this assay can detect is 250 copies / mL. A negative result does not preclude  SARS-CoV-2 infection and should not be used as the sole basis for treatment or other patient management decisions.  A negative result may occur with improper specimen collection / handling, submission of specimen other than nasopharyngeal swab, presence of viral mutation(s) within the areas targeted by this assay, and inadequate number of viral copies (<250 copies / mL). A negative result must be combined with clinical observations, patient history, and epidemiological information.  Fact Sheet for Patients:   BoilerBrush.com.cy  Fact Sheet for Healthcare Providers: https://pope.com/  This test is not yet approved or  cleared by the [place] FDA and has been authorized for detection and/or diagnosis of SARS-CoV-2 by FDA under an Emergency Use Authorization (EUA).  This EUA will remain in effect (meaning this test can be used) for the duration of the COVID-19 declaration under Section 564(b)(1) of the Act, 21 U.S.C. section 360bbb-3(b)(1), unless the authorization is terminated or revoked sooner.  Performed at [facility] [place] [facility], [address] [place], [place] [address]   Blood culture (routine x 2)     Status: None   Collection Time: 08/27/20  6:03 PM   Specimen: BLOOD  Result Value Ref Range Status   Specimen Description   Final    BLOOD RIGHT ARM Performed at [facility], [address], [place], [place] [address]    Special Requests   Final    BOTTLES DRAWN AEROBIC AND ANAEROBIC Blood Culture adequate volume Performed at [facility], [address], [place], [place] [address]    Culture   Final    NO GROWTH 5 DAYS Performed at [facility], [address] [place], [place] [address]    Report Status 09/01/2020 FINAL  Final  Blood culture (routine x 2)     Status: None   Collection Time: 08/27/20  7:55 PM   Specimen: BLOOD  Result Value Ref Range Status    Specimen Description   Final    BLOOD RIGHT ARM Performed at [facility], [address] [place], [place] [address]    Special Requests   Final    BOTTLES DRAWN AEROBIC AND  ANAEROBIC Blood Culture adequate volume Performed at [facility], [address] [place], [place] [address]    Culture   Final    NO GROWTH 5 DAYS Performed at [facility], [address], [place], [place] [address]    Report Status 09/01/2020 FINAL  Final  Urine culture     Status: Abnormal   Collection Time: 08/27/20 11:53 PM   Specimen: Urine, Random  Result Value Ref Range Status   Specimen Description   Final    URINE, RANDOM Performed at [facility], [address] [place], [place] [address]    Special Requests   Final    NONE Performed at [facility], [address], [place], [place] [address]    Culture MULTIPLE SPECIES PRESENT, SUGGEST RECOLLECTION (A)  Final   Report Status 08/29/2020 FINAL  Final    Procedures/Studies: DG Chest 1 View  Result Date: 08/28/2020 CLINICAL DATA:  Nasogastric tube placement EXAM: CHEST  1 VIEW COMPARISON:  05/13/2020 FINDINGS: Nasogastric tube has been placed with its tip extending into the left mid abdomen overlying the expected mid body of the stomach. Lung volumes are small, but are symmetric and are clear. No pneumothorax or pleural effusion. Coronary artery bypass grafting has been performed. Cardiac size is within normal limits. IMPRESSION: Nasogastric tube tip within the expected mid body of the stomach. Electronically Signed   By: Helyn Numbers MD   On: 08/28/2020 03:28   DG Abd 1 View  Result Date: 08/31/2020 CLINICAL DATA:  87 year old female with small bowel obstruction. EXAM: ABDOMEN - 1 VIEW COMPARISON:  08/30/2020 and earlier. FINDINGS: Portable AP supine view at 0750 hours. Stable enteric tube, side hole at the level of the gastric fundus. Continued gas distended mid  and upper abdominal small bowel loops up to 45 mm diameter. Stomach does not appear dilated. Paucity of large bowel gas as before. Right lower quadrant surgical clips redemonstrated. Vascular calcifications. No acute osseous abnormality identified. Rim calcified bulky gallstone again evident in the right upper quadrant. IMPRESSION: 1. Unresolved small-bowel obstruction, probably with only modest improvement compared to the CT 08/27/2020. 2. Stable enteric tube which decompresses the stomach now. 3. Bulky rim calcified gallstone. Electronically Signed   By: Odessa Fleming M.D.   On: 08/31/2020 08:33   CT ABDOMEN PELVIS W CONTRAST  Result Date: 08/27/2020 CLINICAL DATA:  Right-sided abdominal pain, nausea, and vomiting. Previous history of small-bowel obstructions. EXAM: CT ABDOMEN AND PELVIS WITH CONTRAST TECHNIQUE: Multidetector CT imaging of the abdomen and pelvis was performed using the standard protocol following bolus administration of intravenous contrast. CONTRAST:  34mL OMNIPAQUE IOHEXOL 300 MG/ML  SOLN COMPARISON:  07/16/2020 FINDINGS: Lower chest: Patchy infiltrates or atelectasis in the lung bases. Nonspecific pattern. Postoperative changes in the mediastinum. Cardiac enlargement. Dilated fluid-filled esophagus with patulous appearance of the esophagogastric junction. This likely represents reflux although dysmotility or achalasia could have this appearance. Hepatobiliary: The gallbladder is distended and contains a very large gallstone measuring up to 7.4 by 4.2 cm in diameter. Not significantly changed since prior study. There is moderate intra and extrahepatic bile duct dilatation. No common duct stones are identified. No focal liver lesions. Portal veins are patent. Pancreas: Unremarkable. No pancreatic ductal dilatation or surrounding inflammatory changes. Spleen: Focal low-attenuation lesion measuring 1.7 cm diameter. Similar appearance to previous study, likely hemangioma. Adrenals/Urinary Tract: No  adrenal gland nodules. Bilateral renal parenchymal atrophy. Nephrograms are homogeneous. No hydronephrosis or hydroureter. Bladder is normal.  Stomach/Bowel: Stomach is fluid distended. Duodenum and proximal jejunum are also demonstrating prominent fluid distention. The distal jejunum and ileum are decompressed with transition zone in the upper mid abdomen. Changes are consistent with high-grade small bowel obstruction. Surgical anastomosis in the ilium is well distal to the transition zone. Colon is mostly decompressed. Significant diverticulosis of the sigmoid colon without evidence of diverticulitis. Vascular/Lymphatic: Prominent aortic calcification with calcific and noncalcific plaque formation. No aneurysm. No significant lymphadenopathy. Reproductive: Status post hysterectomy. No adnexal masses. Other: No free air or free fluid in the abdomen. Abdominal wall musculature appears intact. Musculoskeletal: Spondylolysis with mild spondylolisthesis at L4-5. Mild degenerative changes in the spine. No destructive bone lesions. IMPRESSION: 1. High-grade small bowel obstruction with transition zone in the upper mid abdomen. 2. Distended gallbladder with large gallstone. Moderate intra and extrahepatic bile duct dilatation. No common duct stones are identified. 3. Patchy infiltrates or atelectasis in the lung bases. Cardiac enlargement. 4. Significant sigmoid diverticulosis without evidence of diverticulitis. 5. Prominent aortic calcification with calcific and noncalcific plaque formation. 6. Spondylolysis with mild spondylolisthesis at L4-5. 7. Aortic atherosclerosis. Aortic Atherosclerosis (ICD10-I70.0). Electronically Signed   By: Burman Nieves M.D.   On: 08/27/2020 19:57   DG Abd Portable 1V  Result Date: 08/30/2020 CLINICAL DATA:  Small-bowel obstruction. EXAM: PORTABLE ABDOMEN - 1 VIEW COMPARISON:  08/29/2020. FINDINGS: Limited exam as the patient would not lie flat. Surgical sutures right lower abdomen. NG  tube noted with tip and side hole over the stomach. Persistent dilated loop of proximal small bowel noted. Paucity of distal bowel gas. Hemidiaphragms not completely imaged. No free air is identified. Aortoiliac atherosclerotic vascular calcification. Degenerative changes lumbar spine and both hips. IMPRESSION: Limited exam. NG tube noted with tip and side hole over the stomach. Persistent dilated loop of proximal small bowel noted. Paucity of distal bowel gas. Findings consistent with persistent small-bowel obstruction. Electronically Signed   By: Maisie Fus  Register   On: 08/30/2020 06:04   DG Abd Portable 1V  Result Date: 08/29/2020 CLINICAL DATA:  NG tube position EXAM: PORTABLE ABDOMEN - 1 VIEW COMPARISON:  08/28/2020 FINDINGS: Enteric tube tip is in the left upper quadrant consistent with location in the body of the stomach. Residual contrast material in visualized small bowel with small bowel distention. IMPRESSION: Enteric tube tip is in the left upper quadrant consistent with location in the body of the stomach. Electronically Signed   By: Burman Nieves M.D.   On: 08/29/2020 02:43   DG Abd Portable 1V-Small Bowel Obstruction Protocol-initial, 8 hr delay  Result Date: 08/28/2020 CLINICAL DATA:  Small bowel obstruction EXAM: PORTABLE ABDOMEN - 1 VIEW COMPARISON:  CT earlier today FINDINGS: Contrast seen within the stomach and dilated proximal small bowel loops compatible with high-grade small bowel obstruction. NG tube is in the stomach. Contrast material within the urinary bladder from prior CT. No organomegaly. IMPRESSION: High-grade small bowel obstruction. Electronically Signed   By: Charlett Nose M.D.   On: 08/28/2020 19:04     Time coordinating discharge: Over 30 minutes    Lewie Chamber, MD  [facility] 09/06/2020, 10:56 AM

## 2020-09-06 NOTE — TOC Transition Note (Signed)
Transition of Care [facility]) - CM/SW Discharge Note   Patient Details  Name: Sonya Chavez MRN: [id number] Date of Birth: [birth year]  Transition of Care [facility] CM/SW Contact:  Bartholome Bill, RN Phone Number: 09/06/2020, 1:08 PM   Clinical Narrative:     Pt to dc to [facility] today. Yellow DNR on chart for transport. [facility] contacted to transport to facility.      Readmission Risk Interventions Readmission Risk Prevention Plan 07/19/2020  Transportation Screening Complete  PCP or Specialist Appt within 5-7 Days Complete  Home Care Screening Complete  Medication Review (RN CM) Complete  Some recent data might be hidden

## 2020-09-06 NOTE — Progress Notes (Signed)
Pt discharged to [facility] via [place]. Report called to [facility] RN at [facility]. Pt medicated with Morphine prior to transport. Belongings given to husband.

## 2020-09-07 ENCOUNTER — Other Ambulatory Visit: Payer: Self-pay | Admitting: *Deleted

## 2020-09-07 NOTE — Patient Outreach (Signed)
[facility]) Care Management  09/07/2020  [place] Sonya Chavez Feb 05, 1926 [id number]   Notified that member discharged to [facility] place for residential hospice care.  Call placed to daughter in law Sonya Chavez, confirmed member is now active with hospice.  Denies any questions at this time, verbalizes gratitude for all of [facility] 's assistance with member's care.  Will close cas at this time.  Goals Addressed            This Visit's Progress   . COMPLETED: [facility] - Behavior Symptoms Management       Follow up date ; 09/03/2020  Notes: Referral placed to [facility] to help with increasing support in the home and adherence to plan of care  2/3 - goal completed due to transition to hospice    . COMPLETED: [facility] - Decrease inpatient admissions/ readmissions with in the next year       Timeframe:  Long-Range Goal Priority:  High Start Date:    07/24/2020                         Expected End Date:    09/03/2020  12/20 - Reviewed discharge instructions and diet recommendations with daughter in law.                   2/3 - goal completed due to transition to hospice    . COMPLETED: [facility] - Enhance My Mental Skills       Follow Up Date 08/14/2020   - check out volunteer opportunities - stay in touch with my family and friends - take a walk daily and think about what I am seeing    Why is this important?   As we age, or sometimes because we have an illness, it feels like our memory and ability to figure things out is not very good.  There are things you can do to keep your memory and your thinking as strong as possible.    Notes:   11/15 - discussed interventions for interaction with others  2/3 - goal completed due to transition to hospice        Kemper Durie, RN, MSN [facility] Care Management  [facility] Manager [phone]

## 2020-10-03 DEATH — deceased
